# Patient Record
Sex: Female | Born: 1975 | Race: White | Hispanic: No | Marital: Married | Smoking: Former smoker
Health system: Southern US, Community
[De-identification: ages and names within clinical notes are randomized; demographics above are authoritative.]

## PROBLEM LIST (undated history)

## (undated) DIAGNOSIS — K08109 Complete loss of teeth, unspecified cause, unspecified class: Secondary | ICD-10-CM

## (undated) DIAGNOSIS — D494 Neoplasm of unspecified behavior of bladder: Secondary | ICD-10-CM

## (undated) DIAGNOSIS — F319 Bipolar disorder, unspecified: Secondary | ICD-10-CM

## (undated) DIAGNOSIS — F32A Depression, unspecified: Secondary | ICD-10-CM

## (undated) DIAGNOSIS — K5900 Constipation, unspecified: Secondary | ICD-10-CM

## (undated) DIAGNOSIS — I10 Essential (primary) hypertension: Secondary | ICD-10-CM

## (undated) DIAGNOSIS — E785 Hyperlipidemia, unspecified: Secondary | ICD-10-CM

## (undated) DIAGNOSIS — K2270 Barrett's esophagus without dysplasia: Secondary | ICD-10-CM

## (undated) DIAGNOSIS — Z972 Presence of dental prosthetic device (complete) (partial): Secondary | ICD-10-CM

## (undated) DIAGNOSIS — R42 Dizziness and giddiness: Secondary | ICD-10-CM

## (undated) DIAGNOSIS — R3915 Urgency of urination: Secondary | ICD-10-CM

## (undated) DIAGNOSIS — F419 Anxiety disorder, unspecified: Secondary | ICD-10-CM

## (undated) DIAGNOSIS — I2089 Other forms of angina pectoris: Secondary | ICD-10-CM

## (undated) DIAGNOSIS — J309 Allergic rhinitis, unspecified: Secondary | ICD-10-CM

## (undated) DIAGNOSIS — D649 Anemia, unspecified: Secondary | ICD-10-CM

## (undated) DIAGNOSIS — F909 Attention-deficit hyperactivity disorder, unspecified type: Secondary | ICD-10-CM

## (undated) DIAGNOSIS — E119 Type 2 diabetes mellitus without complications: Secondary | ICD-10-CM

## (undated) DIAGNOSIS — Z973 Presence of spectacles and contact lenses: Secondary | ICD-10-CM

## (undated) DIAGNOSIS — I2510 Atherosclerotic heart disease of native coronary artery without angina pectoris: Secondary | ICD-10-CM

## (undated) DIAGNOSIS — Z87442 Personal history of urinary calculi: Secondary | ICD-10-CM

## (undated) DIAGNOSIS — C801 Malignant (primary) neoplasm, unspecified: Secondary | ICD-10-CM

## (undated) DIAGNOSIS — I208 Other forms of angina pectoris: Secondary | ICD-10-CM

## (undated) DIAGNOSIS — R310 Gross hematuria: Secondary | ICD-10-CM

## (undated) DIAGNOSIS — M1990 Unspecified osteoarthritis, unspecified site: Secondary | ICD-10-CM

## (undated) DIAGNOSIS — K219 Gastro-esophageal reflux disease without esophagitis: Secondary | ICD-10-CM

## (undated) HISTORY — DX: Other forms of angina pectoris: I20.8

## (undated) HISTORY — PX: OVARIAN CYST REMOVAL: SHX89

## (undated) HISTORY — DX: Other forms of angina pectoris: I20.89

## (undated) HISTORY — DX: Constipation, unspecified: K59.00

## (undated) HISTORY — DX: Barrett's esophagus without dysplasia: K22.70

## (undated) HISTORY — DX: Atherosclerotic heart disease of native coronary artery without angina pectoris: I25.10

## (undated) HISTORY — PX: ABDOMINAL HYSTERECTOMY: SHX81

## (undated) HISTORY — PX: FOOT SURGERY: SHX648

---

## 2004-05-07 ENCOUNTER — Inpatient Hospital Stay (HOSPITAL_COMMUNITY): Admission: EM | Admit: 2004-05-07 | Discharge: 2004-05-09 | Payer: Self-pay | Admitting: Emergency Medicine

## 2004-05-16 ENCOUNTER — Inpatient Hospital Stay (HOSPITAL_COMMUNITY): Admission: RE | Admit: 2004-05-16 | Discharge: 2004-05-19 | Payer: Self-pay | Admitting: Obstetrics & Gynecology

## 2004-05-16 HISTORY — PX: TOTAL ABDOMINAL HYSTERECTOMY W/ BILATERAL SALPINGOOPHORECTOMY: SHX83

## 2004-05-31 ENCOUNTER — Emergency Department (HOSPITAL_COMMUNITY): Admission: EM | Admit: 2004-05-31 | Discharge: 2004-05-31 | Payer: Self-pay | Admitting: Emergency Medicine

## 2004-06-12 ENCOUNTER — Encounter (HOSPITAL_COMMUNITY): Admission: RE | Admit: 2004-06-12 | Discharge: 2004-07-12 | Payer: Self-pay | Admitting: Obstetrics & Gynecology

## 2004-12-30 ENCOUNTER — Ambulatory Visit (HOSPITAL_COMMUNITY): Admission: RE | Admit: 2004-12-30 | Discharge: 2004-12-31 | Payer: Self-pay | Admitting: Otolaryngology

## 2004-12-30 ENCOUNTER — Encounter (INDEPENDENT_AMBULATORY_CARE_PROVIDER_SITE_OTHER): Payer: Self-pay | Admitting: *Deleted

## 2004-12-30 HISTORY — PX: TONSILLECTOMY: SUR1361

## 2005-03-08 ENCOUNTER — Emergency Department (HOSPITAL_COMMUNITY): Admission: EM | Admit: 2005-03-08 | Discharge: 2005-03-08 | Payer: Self-pay | Admitting: Emergency Medicine

## 2005-06-18 ENCOUNTER — Emergency Department (HOSPITAL_COMMUNITY): Admission: EM | Admit: 2005-06-18 | Discharge: 2005-06-18 | Payer: Self-pay | Admitting: Emergency Medicine

## 2005-07-06 ENCOUNTER — Encounter (HOSPITAL_COMMUNITY): Admission: RE | Admit: 2005-07-06 | Discharge: 2005-08-05 | Payer: Self-pay | Admitting: Internal Medicine

## 2005-10-08 ENCOUNTER — Encounter (HOSPITAL_COMMUNITY): Admission: RE | Admit: 2005-10-08 | Discharge: 2005-10-10 | Payer: Self-pay | Admitting: Orthopaedic Surgery

## 2005-10-12 ENCOUNTER — Encounter (HOSPITAL_COMMUNITY): Admission: RE | Admit: 2005-10-12 | Discharge: 2005-11-11 | Payer: Self-pay | Admitting: Orthopaedic Surgery

## 2005-11-12 ENCOUNTER — Encounter (HOSPITAL_COMMUNITY): Admission: RE | Admit: 2005-11-12 | Discharge: 2005-12-12 | Payer: Self-pay | Admitting: Orthopaedic Surgery

## 2006-05-07 ENCOUNTER — Emergency Department (HOSPITAL_COMMUNITY): Admission: EM | Admit: 2006-05-07 | Discharge: 2006-05-07 | Payer: Self-pay | Admitting: Emergency Medicine

## 2007-06-08 ENCOUNTER — Encounter (HOSPITAL_COMMUNITY): Admission: RE | Admit: 2007-06-08 | Discharge: 2007-07-08 | Payer: Self-pay | Admitting: Orthopaedic Surgery

## 2007-07-11 ENCOUNTER — Encounter (HOSPITAL_COMMUNITY): Admission: RE | Admit: 2007-07-11 | Discharge: 2007-08-10 | Payer: Self-pay | Admitting: Orthopaedic Surgery

## 2007-08-11 ENCOUNTER — Encounter (HOSPITAL_COMMUNITY): Admission: RE | Admit: 2007-08-11 | Discharge: 2007-09-10 | Payer: Self-pay | Admitting: Orthopaedic Surgery

## 2008-09-17 ENCOUNTER — Emergency Department (HOSPITAL_COMMUNITY): Admission: EM | Admit: 2008-09-17 | Discharge: 2008-09-17 | Payer: Self-pay | Admitting: Emergency Medicine

## 2009-08-19 ENCOUNTER — Emergency Department (HOSPITAL_COMMUNITY): Admission: EM | Admit: 2009-08-19 | Discharge: 2009-08-19 | Payer: Self-pay | Admitting: Emergency Medicine

## 2009-10-02 ENCOUNTER — Ambulatory Visit: Payer: Self-pay | Admitting: Orthopedic Surgery

## 2009-10-02 ENCOUNTER — Encounter (INDEPENDENT_AMBULATORY_CARE_PROVIDER_SITE_OTHER): Payer: Self-pay | Admitting: *Deleted

## 2009-10-02 DIAGNOSIS — G5600 Carpal tunnel syndrome, unspecified upper limb: Secondary | ICD-10-CM

## 2009-10-06 ENCOUNTER — Emergency Department (HOSPITAL_COMMUNITY): Admission: EM | Admit: 2009-10-06 | Discharge: 2009-10-06 | Payer: Self-pay | Admitting: Emergency Medicine

## 2009-12-19 ENCOUNTER — Emergency Department (HOSPITAL_COMMUNITY): Admission: EM | Admit: 2009-12-19 | Discharge: 2009-09-23 | Payer: Self-pay | Admitting: Emergency Medicine

## 2010-02-11 NOTE — Letter (Signed)
Summary: Out of Work  [facility]  [address]  [place], [place] [address]   Phone: [phone]  Fax: [phone]    October 02, 2009   Employee:  TIFFANY CALMES    To Whom It May Concern:   For Medical reasons, please excuse the above named employee from work for the following dates:  Start:   10/02/09  End/Return to regular work schedule, following appointment in our office today: 10/02/09  If you need additional information, please feel free to contact our office.         Sincerely,    Terrance Mass, MD

## 2010-02-11 NOTE — Letter (Signed)
Summary: History form  History form   Imported By: Jacklynn Ganong 10/07/2009 08:57:33  _____________________________________________________________________  External Attachment:    Type:   Image     Comment:   External Document

## 2010-02-11 NOTE — Assessment & Plan Note (Signed)
Summary: EVAL/TREAT GANGLION CYSTS,LT HAND/WRIST/XRAYS [facility],BR...   Vital Signs:  Patient profile:   35 year old female Height:      61.5 inches Weight:      167 pounds Pulse rate:   72 / minute Resp:     18 per minute  Vitals Entered By: Fuller Canada MD (October 02, 2009 2:27 PM)  Visit Type:  new patient Referring Provider:  Dr. Jorene Guest Primary Provider:  Dr. Jorene Guest  CC:  ganglion left hand.  History of Present Illness: I saw Sandra [name] in the office today for an initial visit.  She is a 35 years old woman with the complaint of:  ganglion cysts left hand/wrist.  Xrays 09/12/09 for review on disc, report for review also.  Meds: none.  The patient complains of numbness tingling and inability to hold things status post carpal tunnel release 4 years ago.  She has a ganglion cyst in the dorsum of her wrist which apparently her referring physicians thinks is causing her pain because she had a nerve conduction study which was negative.  However, she indicates that when she puts her carpal tunnel brace on her symptoms go away.  DR Rayburn Ma did carpal tunnel relase 4 years ago.   I was able to obtain the nerve conduction study report from [place] and it indicates that the patient has recurrent carpal tunnel syndrome.      Allergies (verified): 1)  ! Penicillin 2)  ! Flagyl 3)  ! Claritin 4)  ! 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  Review of Systems Constitutional:  Denies weight loss, weight gain, fever, chills, and fatigue. Cardiovascular:  Denies chest pain, palpitations, fainting, and  murmurs. Respiratory:  Complains of couch; denies short of breath, wheezing, tightness, pain on inspiration, and snoring . Gastrointestinal:  Complains of heartburn; denies nausea, vomiting, diarrhea, constipation, and blood in your stools. Genitourinary:  Denies frequency, urgency, difficulty urinating, painful urination, flank pain, and bleeding in urine. Neurologic:  Complains of numbness and tingling; denies unsteady gait, dizziness, tremors, and seizure. Musculoskeletal:  Complains of joint pain and muscle pain; denies swelling, instability, stiffness, redness, and heat. Endocrine:  Denies excessive thirst, exessive urination, and heat or cold intolerance. Psychiatric:  Complains of depression and anxiety; denies nervousness and hallucinations. Skin:  Denies changes in the skin, poor healing, rash, itching, and redness. HEENT:  Denies blurred or double vision, eye pain, redness, and watering. Immunology:  Complains of seasonal allergies; denies sinus problems and allergic to bee stings. Hemoatologic:  Denies easy bleeding and brusing.  Physical Exam  Additional Exam:  GEN: well developed, well nourished, normal grooming and hygiene, no deformity and normal body habitus.   CDV: pulses are normal, no edema, no erythema. no tenderness  Lymph: normal lymph nodes   Skin: no rashes, skin lesions or open sores   NEURO: normal coordination, reflexes, sensation.   Psyche: awake, alert and oriented. Mood normal   There is a carpal tunnel incision on the volar aspect of the LEFT hand and a small ganglion cyst on dorsal surface.  It is nontender.  She has full range of motion of the hand.  Her grip strength  seems normal.  Her wrist joint is stable.  She has normal range of motion in the hand.  Her carpal tunnel is nontender.    Impression & Recommendations:  Problem # 1:  CARPAL TUNNEL SYNDROME (ICD-354.0) Assessment New imaging: X-rays were obtained through a disc hand and wrist 3  views each 60s total negative.   Based on her history and her relief of symptoms with bracing and her history of similar symptoms prior which she confirms is just like her carpal tunnel symptoms were before I think she has carpal tunnel syndrome.  It is unlikely that the ganglion cyst which cause numbness tingling and loss of strength in the hand  Recommend bracing at night, vitamin B 600 mg twice a day returns 6 weeks reassess.  Orders: New Patient Level III (40981)  Patient Instructions: 1)  brace at night and VIT B6 for 6 weeks

## 2010-02-11 NOTE — Letter (Signed)
Summary: Previous notes from another doctor  Previous notes from another doctor   Imported By: Jacklynn Ganong 10/07/2009 08:58:33  _____________________________________________________________________  External Attachment:    Type:   Image     Comment:   External Document

## 2010-04-18 LAB — URINALYSIS, ROUTINE W REFLEX MICROSCOPIC
Glucose, UA: NEGATIVE mg/dL
Hgb urine dipstick: NEGATIVE
Ketones, ur: NEGATIVE mg/dL
Nitrite: NEGATIVE
Specific Gravity, Urine: >1.03 — ABNORMAL HIGH (ref 1.005–1.030)

## 2010-05-30 NOTE — Op Note (Signed)
[name], [name]               ACCOUNT NO.:  [id number]   MEDICAL RECORD NO.:  [id number]          PATIENT TYPE:  OIB   LOCATION:  5709                         FACILITY:  [facility]   PHYSICIAN:  Antony Contras, MD     DATE OF BIRTH:  06-27-1975   DATE OF PROCEDURE:  12/30/2004  DATE OF DISCHARGE:  12/31/2004                                 OPERATIVE REPORT   PREOPERATIVE DIAGNOSIS:  Chronic tonsillitis.   POSTOPERATIVE DIAGNOSIS:  Chronic tonsillitis.   PROCEDURE:  Tonsillectomy.   SURGEON:  Antony Contras, M.D.   ANESTHESIA:  General endotracheal anesthesia.   COMPLICATIONS:  None.   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  This has been a problem for about 20 years and requires  multiple rounds of antibiotics and is associated with missed work and other  activities.  She presents for surgical removal of her tonsils.   FINDINGS:  Tonsils were about 3+ in size bilaterally.   DESCRIPTION OF PROCEDURE:  Patient was identified in the holding room and  informed consent having been obtained, the patient is moved to the operative  suite and put on the operating table in supine position.  Anesthesia was  induced.  The patient was intubated by the anesthesia team without  difficulty.  The patient was given intravenous antibiotics and steroids  during the case.  The bed was turned 90 degrees from anesthesia  and a head  wrap was placed around the patient's head.  A Crowe-Davis retractor was then  inserted into the mouth and opened to reveal the oropharynx.  This was  placed in suspension on a Mayo stand.  The right tonsil was grasped with a  curved Allis and retracted medially.  Bovie electrocautery was then used to  make a curvilinear incision along the anterior tonsillar pillar and then  used to dissect the subcapsular plane until the tonsil was  removed.  Tonsil  was passed to nursing for pathology and a tonsil pack was placed in the  right tonsil fossa due to bleeding.  Same procedure was then carried out on  the left side including Bovie electrocautery dissection of the tonsil and  placement of a pack.  Once again, the tonsil was passed to nursing for  pathology.  The right tonsil pack was then removed and bleeding controlled  with suction and Bovie electrocautery.  The same was then performed on the  left side.  The oropharynx was then copiously irrigated with saline.  The  uvula was then pulled anteriorly and laryngeal mirror was inserted to view  the nasopharynx.  The adenoids found to be nonexistent.  At this point, the  nose and mouth were copiously  irrigated again with saline.  A nasogastric tube was passed down the  esophagus to suck out the stomach and esophagus.  The retractor was then  taken out of suspension and removed from the patient's mouth.  She was  turned back to anesthesia for wake-up and  was extubated and moved to the  recovery room in stable condition.      Antony Contras, MD  Electronically Signed     DDB/MEDQ  D:  12/30/2004  T:  01/01/2005  Job:  [id number]

## 2010-05-30 NOTE — Consult Note (Signed)
Sandra [name], TROUTMAN               ACCOUNT NO.:  [id number]   MEDICAL RECORD NO.:  [id number]          PATIENT TYPE:  EMS   LOCATION:  ED                            FACILITY:  [facility]   PHYSICIAN:  Tilda Burrow, M.D. DATE OF BIRTH:  09-09-1975   DATE OF CONSULTATION:  05/31/2004  DATE OF DISCHARGE:                                   CONSULTATION   CHIEF COMPLAINT:  Wound leaking blood.   This 35 year old female is now approximately two weeks status post  hysterectomy with identified subcutaneous wound hematoma seen in the office  yesterday. Small opening has continued to gush intermittently through the  night. The patient presents to the emergency room for reassessment.  Examination shows well healed surface with a small opening that upon  palpation produces dark old blood without evidence of purulence. There is no  local erythema.   Prepping and local anesthesia infiltration with Xylocaine with epinephrine  2% was performed, and then wound opened to a width of 3 to 4 cm and probed  to the depth of the fascia. It is then irrigated with saline solution until  the majority of the clots and blood are removed. Then the tip of two 4x4s  packed in each corner to allow wicking to occur. Loose dressing applied, and  patient given instructions for care. Will follow up 48 hours, 9 a.m. Monday,  [facility], appointment with Dr. Despina Hidden for removal of 4x4s and  consideration of closing the wound if it looks nice and clean. May require  more than one packing before delayed secondary closure.   DISCHARGE MEDICATIONS:  1.  Vicodin 5/500 x20 tablets, refill x1, 1 to 2 q.4h p.r.n. pain.  2.  Estratest 1 tablet p.o. q.d., refill x1 year.  3.  Keflex 500 q.i.d. x5 days, dispense 20.      JVF/MEDQ  D:  05/31/2004  T:  05/31/2004  Job:  [id number]   cc:   [facility]

## 2010-05-30 NOTE — Discharge Summary (Signed)
Sandra [name], Sandra [name]               ACCOUNT NO.:  [id number]   MEDICAL RECORD NO.:  [id number]          PATIENT TYPE:  INP   LOCATION:  A428                          FACILITY:  [facility]   PHYSICIAN:  Lazaro Arms, M.D.   DATE OF BIRTH:  26-Oct-1975   DATE OF ADMISSION:  05/16/2004  DATE OF DISCHARGE:  05/08/2006LH                                 DISCHARGE SUMMARY   DISCHARGE DIAGNOSES:  1.  Status post total abdominal hysterectomy/bilateral salpingo-      oophorectomy.  2.  Unremarkable postoperative course.   PROCEDURE:  Total abdominal hysterectomy/bilateral salpingo-oophorectomy.   Please refer to the transcribed history and physical and the operative note  for details of admission to the hospital.   HOSPITAL COURSE:  Patient was admitted postoperatively.  Surgery went well.  She had sort of excessive pain postoperatively but we were able to control  it with Toradol and a Dilaudid PCA.  Her postoperative day #1 hemoglobin was  12.3, hematocrit 35.6.  Postoperative day #3 it was 11.4 and 33.2 and the  white counts were normal.  She tolerated a progression diet from clear  liquids to regular diet.  She voided without symptoms.  She was ambulatory.  Her incision was clean, dry, and intact and she remained afebrile.  She was  discharged to home on postoperative day #3 in good, stable condition to take  Tylox and Toradol.  She was given Prevacid and Ambien as well for sleep.  She will be seen back in the office next week to have her staples removed  and Steri-Strips placed.  She was given instructions and precautions for  return to the office prior to that time.      LHE/MEDQ  D:  06/04/2004  T:  06/04/2004  Job:  [id number]

## 2010-05-30 NOTE — H&P (Signed)
Sandra [name], Sandra [name]               ACCOUNT NO.:  [id number]   MEDICAL RECORD NO.:  [id number]          PATIENT TYPE:  INP   LOCATION:  A428                          FACILITY:  [facility]   PHYSICIAN:  Lazaro Arms, M.D.   DATE OF BIRTH:  28-Sep-1975   DATE OF ADMISSION:  05/07/2004  DATE OF DISCHARGE:  LH                                HISTORY & PHYSICAL   HISTORY:  Willie is 35 year old gravida 0, para 0 who is seen in the ER  complaining of pelvic pain and right lower quadrant pain.  The patient had  been seen in [place] the night previously and was actually supposed to see  [place] for the first time this Friday.  At the time of evaluation in [place]  she was found to have right hydrosalpinx and some free fluid in the pelvis.  She had been afebrile.  On exam in the ER she has significant tenderness of  moderate-to-severe.  No rebound.  This is not an acute abdomen.  She has no  CVA tenderness.  My impression is that this represents a possible tube  ovarian abscess of a flair of a previous problem.  She had surgery back in  2003 up in [place].  She had a cyst removed from her right ovary and  fluid drained from her right tube, which makes me think she probably had  some inflammatory process, especially since she does not use birth control  and has never been pregnant.  This probably represents a chronic  inflammatory/infectious process of the pelvis.   PAST MEDICAL HISTORY:  Otherwise negative.   PAST SURGICAL HISTORY:  Just the surgery noted above.   PAST OBSTETRICAL HISTORY:  She is nulliparous.   MEDICATIONS:  Motrin as needed.   ALLERGIES:  1. PENICILLIN.  2. FLAGYL.     REVIEW OF SYSTEMS:  As per HPI.  Again, she has been afebrile according to  her history.   PHYSICAL EXAMINATION:  HEENT:  Unremarkable.  NECK:  Thyroid is normal.  CHEST:  Clear.  HEART:  Regular rate and rhythm without murmurs, rubs, or gallops.  BREASTS:  Deferred.  ABDOMEN:  Again as above she has  tenderness in both lower quadrants.  I  would say right greater than left.  No rebound.  No CVA tenderness.  PELVIC:  Reveals fullness in the right adnexa and +- cervical motion  tenderness.  EXTREMITIES:  Warm.  No edema.  NEUROLOGIC:  Grossly intact.   IMPRESSION:  1. Pelvic abdominal pain.  2. History of chronic inflammatory/infectious pelvic process.  3. Tubal ovarian process with a right hydrosalpinx and cystic area around      the right ovary.     PLAN:  We will do a CT scan of the abdomen and pelvis today to evaluate for  tubal ovarian abscess versus pelvic abscess.  If she does not respond to  antibiotics appropriately, then we will consider aggressive surgical  management.      LHE/MEDQ  D:  05/08/2004  T:  05/08/2004  Job:  [id number]

## 2010-05-30 NOTE — Op Note (Signed)
NAMEALDEN, Sandra [name]               ACCOUNT NO.:  [id number]   MEDICAL RECORD NO.:  [id number]          PATIENT TYPE:  INP   LOCATION:  A428                          FACILITY:  [facility]   PHYSICIAN:  Lazaro Arms, M.D.   DATE OF BIRTH:  07/21/1975   DATE OF PROCEDURE:  05/16/2004  DATE OF DISCHARGE:  05/19/2004                                 OPERATIVE REPORT   PREOPERATIVE DIAGNOSES:  1.  Chronic pelvic pain.  2.  Dysmenorrhea.  3.  Right hydrosalpinx and previous surgery for right ovarian cyst.   POSTOPERATIVE DIAGNOSES:  1.  Chronic pelvic pain.  2.  Dysmenorrhea.  3.  Right hydrosalpinx and previous surgery for right ovarian cyst.   PROCEDURES:  1.  Abdominal hysterectomy.  2.  Bilateral salpingo-oophorectomy.   SURGEON:  Lazaro Arms, M.D.   ANESTHESIA:  General endotracheal.   FINDINGS:  The patient had adhesions of the right ovary and tube, the right  pelvic sidewall and small bowel.  She did not really have a hydrosalpinx as  was demonstrated on the ultrasound.  She had some filmy adhesions to the  adnexa bilaterally.  Otherwise the uterus appeared to be normal.   DESCRIPTION OF PROCEDURE:  The patient was taken to the operating room and  placed in the supine position where she underwent general endotracheal  anesthesia.  She was then prepped and draped in the usual sterile fashion.  A Pfannenstiel skin incision was made and carried down sharply to the rectus  fashion, which was scored in the midline and extended laterally.  The fascia  was taken off of the muscles superiorly and inferiorly without difficulty.  The muscles were divided and the perineal cavity was entered.  A self-  retaining retractor was placed.  The upper abdomen was packed away.  The  uterine cornu were grasped.  The left round ligament was suture ligated and  cut.  The right round ligament was suture ligated and cut.  The infundibular  pelvic ligament was isolated bilaterally.  It was clamped,  cut and double  suture ligated bilaterally.  The vesicouterine serosal flap was created and  pushed down off of the lower uterine segment.  The uterine vessels were  skeletonized.  Each uterine vessel pedicle was clamped, cut and suture  ligated.  Serial pedicles were taken down the cervix to the cardial  ligament, each pedicle being clamped, cut and suture ligated.  The vagina  was encountered.  It was cross clamped.  The specimen was removed.  Vaginal  angle sutures were placed.  The vagina was closed with interrupted figure-of-  eight sutures.  The pelvis was irrigated vigorously.  There was good  hemostasis of all pedicles.  Interceed was placed over the vaginal cuff to  prevent adhesions in the future.  Again, all pedicles were hemostatic.  The  protractor was removed, as well as the packs in the upper abdomen.  The  muscles were reapproximated loosely.  The fascia was closed using 0 Vicryl  running.  The subcutaneous tissue was made hemostatic and irrigated.  The  skin was  closed using skin staples.  The patient tolerated the procedure  well.  She experienced 250 mL of blood loss and was taken to recovery in  good stable condition.  All counts were correct x 3.      LHE/MEDQ  D:  06/04/2004  T:  06/04/2004  Job:  [id number]

## 2010-05-30 NOTE — Discharge Summary (Signed)
[name], Sandra               ACCOUNT NO.:  [id number]   MEDICAL RECORD NO.:  [id number]          PATIENT TYPE:  INP   LOCATION:  A428                          FACILITY:  [facility]   PHYSICIAN:  Lazaro Arms, M.D.   DATE OF BIRTH:  12-21-1975   DATE OF ADMISSION:  05/07/2004  DATE OF DISCHARGE:  04/28/2006LH                                 DISCHARGE SUMMARY   DISCHARGE DIAGNOSES:  1.  Chronic pelvic pain.  2.  Dysmenorrhea.  3.  Right hydrosalpinx.   Please refer to the transcribed history and physical for details of  admission to the hospital.   HOSPITAL COURSE:  The patient was admitted and was treated empirically for  pelvic inflammatory disease using Levaquin and Zithromax.  Her cultures  subsequently came back negative.  Ultrasound from an ER visit in [place],  [place], just previous to admission here, revealed that she had a  hydrosalpinx on the right side.  She has had previous pelvic surgery in the  past and has chronic pelvic pain and chronic inflammatory process.  After a  couple of days of IV antibiotics, the patient was really no better.  I felt  like this was a chronic condition.  Her white count was normal.  As a  result, we made plans to discharge her home on May 09, 2004, and to have  her come back to the hospital next week to undergo an abdominal  hysterectomy.  The patient is nulliparous and previously was debating  whether to do definitive surgery, but she has now got to the point where her  pain is such that she needs to do definitive surgery.  We will see her back  next week.  She as discharged to home on oral pain medicine and again to  follow up in the office the first part of next week.      LHE/MEDQ  D:  06/04/2004  T:  06/04/2004  Job:  [id number]

## 2011-03-01 ENCOUNTER — Emergency Department (HOSPITAL_COMMUNITY): Payer: Medicaid Other

## 2011-03-01 ENCOUNTER — Emergency Department (HOSPITAL_COMMUNITY)
Admission: EM | Admit: 2011-03-01 | Discharge: 2011-03-01 | Disposition: A | Discharge status: Home / Self Care | Payer: Medicaid Other | Attending: Emergency Medicine | Admitting: Emergency Medicine

## 2011-03-01 ENCOUNTER — Other Ambulatory Visit: Payer: Self-pay

## 2011-03-01 ENCOUNTER — Encounter (HOSPITAL_COMMUNITY): Payer: Self-pay

## 2011-03-01 DIAGNOSIS — R1033 Periumbilical pain: Secondary | ICD-10-CM | POA: Diagnosis not present

## 2011-03-01 DIAGNOSIS — R1013 Epigastric pain: Secondary | ICD-10-CM | POA: Diagnosis not present

## 2011-03-01 DIAGNOSIS — R109 Unspecified abdominal pain: Secondary | ICD-10-CM | POA: Diagnosis present

## 2011-03-01 DIAGNOSIS — R10811 Right upper quadrant abdominal tenderness: Secondary | ICD-10-CM | POA: Diagnosis not present

## 2011-03-01 DIAGNOSIS — R51 Headache: Secondary | ICD-10-CM | POA: Insufficient documentation

## 2011-03-01 DIAGNOSIS — F17200 Nicotine dependence, unspecified, uncomplicated: Secondary | ICD-10-CM | POA: Insufficient documentation

## 2011-03-01 DIAGNOSIS — D72829 Elevated white blood cell count, unspecified: Secondary | ICD-10-CM | POA: Insufficient documentation

## 2011-03-01 DIAGNOSIS — M549 Dorsalgia, unspecified: Secondary | ICD-10-CM | POA: Diagnosis not present

## 2011-03-01 DIAGNOSIS — Z9079 Acquired absence of other genital organ(s): Secondary | ICD-10-CM | POA: Insufficient documentation

## 2011-03-01 LAB — URINALYSIS, ROUTINE W REFLEX MICROSCOPIC
Bilirubin Urine: NEGATIVE
Ketones, ur: NEGATIVE mg/dL
Protein, ur: NEGATIVE mg/dL
Specific Gravity, Urine: >1.03 — ABNORMAL HIGH (ref 1.005–1.030)
Urobilinogen, UA: 0.2 mg/dL (ref 0.0–1.0)
pH: 5 (ref 5.0–8.0)

## 2011-03-01 LAB — COMPREHENSIVE METABOLIC PANEL
ALT: 17 U/L (ref 0–35)
Albumin: 3.9 g/dL (ref 3.5–5.2)
CO2: 25 mEq/L (ref 19–32)
Creatinine, Ser: 0.81 mg/dL (ref 0.50–1.10)
GFR calc Af Amer: >90 mL/min (ref >90)
Sodium: 136 mEq/L (ref 135–145)
Total Bilirubin: 0.3 mg/dL (ref 0.3–1.2)
Total Protein: 8 g/dL (ref 6.0–8.3)

## 2011-03-01 LAB — CBC
Hemoglobin: 14.7 g/dL (ref 12.0–15.0)
MCH: 26.9 pg (ref 26.0–34.0)
MCHC: 31.5 g/dL (ref 30.0–36.0)
MCV: 85.4 fL (ref 78.0–100.0)
Platelets: 319 10*3/uL (ref 150–400)

## 2011-03-01 LAB — URINE MICROSCOPIC-ADD ON

## 2011-03-01 MED ORDER — HYDROMORPHONE HCL PF 1 MG/ML IJ SOLN
1.0000 mg | Freq: Once | INTRAMUSCULAR | Status: AC
  Administered 2011-03-01: 1 mg via INTRAVENOUS
  Filled 2011-03-01: qty 1

## 2011-03-01 MED ORDER — ONDANSETRON HCL 4 MG/2ML IJ SOLN
4.0000 mg | Freq: Once | INTRAMUSCULAR | Status: AC
  Administered 2011-03-01: 4 mg via INTRAVENOUS
  Filled 2011-03-01: qty 2

## 2011-03-01 MED ORDER — PROMETHAZINE HCL 25 MG PO TABS: 25.0000 mg | ORAL_TABLET | Freq: Four times a day (QID) | ORAL | Status: DC | PRN

## 2011-03-01 MED ORDER — SODIUM CHLORIDE 0.9 % IV BOLUS (SEPSIS)
250.0000 mL | Freq: Once | INTRAVENOUS | Status: AC
  Administered 2011-03-01: 250 mL via INTRAVENOUS

## 2011-03-01 MED ORDER — SODIUM CHLORIDE 0.9 % IV SOLN: INTRAVENOUS | Status: DC

## 2011-03-01 MED ORDER — IOHEXOL 300 MG/ML  SOLN
100.0000 mL | Freq: Once | INTRAMUSCULAR | Status: AC | PRN
  Administered 2011-03-01: 100 mL via INTRAVENOUS

## 2011-03-01 MED ORDER — HYDROMORPHONE HCL 2 MG PO TABS: 2.0000 mg | ORAL_TABLET | ORAL | Status: AC | PRN

## 2011-03-01 NOTE — ED Notes (Signed)
Pt presents with epigastric pain since yesterday. Pt denies all other symptoms.

## 2011-03-01 NOTE — ED Provider Notes (Signed)
History    Scribed for Sandra Jakes, MD, the patient was seen in room APA09/APA09. This chart was scribed by Katha Cabal.   CSN: [id number]  Arrival date & time 03/01/11  1740   First MD Initiated Contact with Patient 03/01/11 1820      Chief Complaint  Patient presents with  . Abdominal Pain    (Consider location/radiation/quality/duration/timing/severity/associated sxs/prior treatment) HPI Sandra [name] is a 36 y.o. female who presents to the Emergency Department complaining of intermittent upper abdominal pain since yesterday around 5 AM.  Pain does not radiate to back.  Pain worsened by nothing.  Not associated with movement or eating.  Patient reports similar pain prior to having hysterectomy.   Symptoms are associated with headaches. Patient has ongoing back pain.  Symptoms are not associated with nausea, vomiting, diarrhea.  Denies cough, congestion, sore throat, neck pain, chest pain, painful urination, vaginal bleeding, leg swelling and rash.   No LMP recorded. Patient has had a hysterectomy. PCP No primary provider on file.    History reviewed. No pertinent past medical history.  Past Surgical History  Procedure Date  . Abdominal hysterectomy     No family history on file.  History  Substance Use Topics  . Smoking status: Current Everyday Smoker -- 0.5 packs/day  . Smokeless tobacco: Not on file  . Alcohol Use: No    OB History    Grav Para Term Preterm Abortions TAB SAB Ect Mult Living                  Review of Systems  All other systems reviewed and are negative.    Allergies  Codeine; Loratadine; Metronidazole; and Penicillins  Home Medications   Current Outpatient Rx  Name Route Sig Dispense Refill  . ALPRAZOLAM 2 MG PO TABS Oral Take 2 mg by mouth 3 (three) times daily.    . TRAMADOL HCL 50 MG PO TABS Oral Take 50 mg by mouth every 6 (six) hours as needed. pain      BP 114/82  Pulse 95  Temp(Src) 97.5 F (36.4 C) (Oral)   Resp 20  Ht 5' (1.524 m)  Wt 145 lb (65.772 kg)  BMI 28.32 kg/m2  SpO2 99%  Physical Exam  Nursing note and vitals reviewed. Constitutional: She is oriented to person, place, and time. She appears well-developed and well-nourished.  HENT:  Head: Normocephalic and atraumatic.  Eyes: Conjunctivae and EOM are normal.  Neck: Neck supple.  Cardiovascular: Normal rate, regular rhythm and normal heart sounds.   No murmur heard. Pulmonary/Chest: Effort normal and breath sounds normal. No respiratory distress.  Abdominal: Soft. Bowel sounds are decreased. There is tenderness in the right upper quadrant. There is no rebound and no guarding.  Musculoskeletal: Normal range of motion. She exhibits no edema.  Neurological: She is alert and oriented to person, place, and time. No sensory deficit.  Skin: Skin is warm and dry. No rash noted.  Psychiatric: She has a normal mood and affect. Her speech is normal and behavior is normal.    ED Course  Procedures (including critical care time)   DIAGNOSTIC STUDIES: Oxygen Saturation is 99% on room air, normal by my interpretation.    EKG:   Date: 03/01/2011  Rate: 61  Rhythm: normal sinus rhythm  QRS Axis: normal  Intervals: normal  ST/T Wave abnormalities: normal  Conduction Disutrbances:none  Narrative Interpretation:   Old EKG Reviewed: none available    COORDINATION OF CARE: 7:00 PM  Physical exam complete.  IV fluids.  Will CT abdomen. CXR.  7:15 PM Zofran and Dilaudid ordered  8:04 PM  Omnipaque ordered 9:00 PM  Plan to discharge patient.  Patient agrees with plan.      LABS / RADIOLOGY:   Labs Reviewed  CBC - Abnormal; Notable for the following:    WBC 11.3 (*)    RBC 5.47 (*)    HCT 46.7 (*)    All other components within normal limits  COMPREHENSIVE METABOLIC PANEL - Abnormal; Notable for the following:    Glucose, Bld 104 (*)    Alkaline Phosphatase 119 (*)    All other components within normal limits  URINALYSIS,  ROUTINE W REFLEX MICROSCOPIC - Abnormal; Notable for the following:    APPearance HAZY (*)    Specific Gravity, Urine >1.030 (*)    Hgb urine dipstick SMALL (*)    All other components within normal limits  URINE MICROSCOPIC-ADD ON - Abnormal; Notable for the following:    Squamous Epithelial / LPF FEW (*)    Bacteria, UA MANY (*)    Crystals CA OXALATE CRYSTALS (*)    All other components within normal limits  LIPASE, BLOOD    Results for orders placed during the hospital encounter of 03/01/11  CBC      Component Value Range   WBC 11.3 (*) 4.0 - 10.5 (K/uL)   RBC 5.47 (*) 3.87 - 5.11 (MIL/uL)   Hemoglobin 14.7  12.0 - 15.0 (g/dL)   HCT 69.6 (*) 29.5 - 46.0 (%)   MCV 85.4  78.0 - 100.0 (fL)   MCH 26.9  26.0 - 34.0 (pg)   MCHC 31.5  30.0 - 36.0 (g/dL)   RDW 28.4  13.2 - 44.0 (%)   Platelets 319  150 - 400 (K/uL)  COMPREHENSIVE METABOLIC PANEL      Component Value Range   Sodium 136  135 - 145 (mEq/L)   Potassium 4.1  3.5 - 5.1 (mEq/L)   Chloride 102  96 - 112 (mEq/L)   CO2 25  19 - 32 (mEq/L)   Glucose, Bld 104 (*) 70 - 99 (mg/dL)   BUN 13  6 - 23 (mg/dL)   Creatinine, Ser 1.02  0.50 - 1.10 (mg/dL)   Calcium 72.5  8.4 - 10.5 (mg/dL)   Total Protein 8.0  6.0 - 8.3 (g/dL)   Albumin 3.9  3.5 - 5.2 (g/dL)   AST 17  0 - 37 (U/L)   ALT 17  0 - 35 (U/L)   Alkaline Phosphatase 119 (*) 39 - 117 (U/L)   Total Bilirubin 0.3  0.3 - 1.2 (mg/dL)   GFR calc non Af Amer >90  >90 (mL/min)   GFR calc Af Amer >90  >90 (mL/min)  LIPASE, BLOOD      Component Value Range   Lipase 31  11 - 59 (U/L)  URINALYSIS, ROUTINE W REFLEX MICROSCOPIC      Component Value Range   Color, Urine YELLOW  YELLOW    APPearance HAZY (*) CLEAR    Specific Gravity, Urine >1.030 (*) 1.005 - 1.030    pH 5.0  5.0 - 8.0    Glucose, UA NEGATIVE  NEGATIVE (mg/dL)   Hgb urine dipstick SMALL (*) NEGATIVE    Bilirubin Urine NEGATIVE  NEGATIVE    Ketones, ur NEGATIVE  NEGATIVE (mg/dL)   Protein, ur NEGATIVE   NEGATIVE (mg/dL)   Urobilinogen, UA 0.2  0.0 - 1.0 (mg/dL)   Nitrite NEGATIVE  NEGATIVE    Leukocytes, UA NEGATIVE  NEGATIVE   URINE MICROSCOPIC-ADD ON      Component Value Range   Squamous Epithelial / LPF FEW (*) RARE    WBC, UA 7-10  <3 (WBC/hpf)   RBC / HPF 7-10  <3 (RBC/hpf)   Bacteria, UA MANY (*) RARE    Crystals CA OXALATE CRYSTALS (*) NEGATIVE     Dg Chest 2 View  03/01/2011  *RADIOLOGY REPORT*  Clinical Data: Epigastric abdominal pain and weakness.  CHEST - 2 VIEW  Comparison: None.  Findings: Normal sized heart.  Clear lungs.  Unremarkable bones.  IMPRESSION: Normal examination.  Original Report Authenticated By: Darrol Angel, M.D.   Ct Abdomen Pelvis W Contrast  03/01/2011  *RADIOLOGY REPORT*  Clinical Data: Abdominal pain.  Periumbilical abdominal pain. History of hysterectomy.  CT ABDOMEN AND PELVIS WITH CONTRAST  Technique:  Multidetector CT imaging of the abdomen and pelvis was performed following the standard protocol during bolus administration of intravenous contrast.  Contrast: OMNIPAQUE IOHEXOL 300 MG/ML IV SOLN  Comparison: 05/07/2006.  Findings: Clear lung bases.  There is a tiny low density lesion in the posterior right hepatic lobe bed is similar in size to the prior exam of 2008.  This is compatible with a small cyst. Gallbladder appears normal.  Pancreas normal.  Common bile duct normal.  Spleen normal.  Adrenal glands normal.  Normal renal enhancement.  The stomach is distended with oral contrast. Duodenum is within normal limits.  Mild thickening of the antrum of the stomach is likely due to peristaltic contraction.  Proximal small bowel is within normal limits.  There are marked inflammatory changes of distal small bowel, with mural thickening and mucosal hyperenhancement.  Mild abdominal aortic atherosclerosis is present.  The superior mesenteric artery appears patent as far as can be visualized.  There is no perforation.  Small amount of ascites in the  anatomic pelvis. Hysterectomy.  Urinary bladder decompressed.  Probable normal appendix identified with adjacent ascites.  Mild thickening of the cecum is present.  Transverse and descending colon appear normal. The sigmoid diverticulosis is mild. No aggressive osseous lesions. Lumbosacral transitional anatomy is present.  IMPRESSION: 1.  Marked inflammatory changes of the ileum, likely secondary to infection or inflammatory bowel disease.  Ischemia is considered unlikely.  No obstruction or perforation. 2.  Hysterectomy. 3.  Stable low density lesion in the posterior right hepatic lobe, likely cyst.  Original Report Authenticated By: Andreas Newport, M.D.         MDM  The expected patient to have gallstone disease but CT scan negative for that shows inflammation of the ileum most likely secondary to infection or inflammatory bowel disease. Patient tolerating by mouth fine at home no fever mild elevation in white blood cell count. Only mild tenderness in the right upper quadrant on exam no guarding no acute abdominal process. Discharge patient home with clear liquid diet for one to 2 days and followup with GI medicine, in addition she's been given pain medicine and antinausea medicine.        MEDICATIONS GIVEN IN THE E.D. Scheduled Meds:    . HYDROmorphone  1 mg Intravenous Once  . ondansetron  4 mg Intravenous Once  . sodium chloride  250 mL Intravenous Once   Continuous Infusions:    . sodium chloride         IMPRESSION: 1. Abdominal  pain, other specified site       I personally performed the services  described in this documentation, which was scribed in my presence. The recorded information has been reviewed and considered.       Sandra Jakes, MD 03/01/11 2111

## 2012-06-07 ENCOUNTER — Encounter (HOSPITAL_COMMUNITY): Payer: Self-pay

## 2012-06-07 ENCOUNTER — Emergency Department (HOSPITAL_COMMUNITY)
Admission: EM | Admit: 2012-06-07 | Discharge: 2012-06-07 | Disposition: A | Discharge status: Home / Self Care | Payer: Medicaid Other | Attending: Emergency Medicine | Admitting: Emergency Medicine

## 2012-06-07 ENCOUNTER — Emergency Department (HOSPITAL_COMMUNITY): Payer: Medicaid Other

## 2012-06-07 DIAGNOSIS — S60222A Contusion of left hand, initial encounter: Secondary | ICD-10-CM

## 2012-06-07 DIAGNOSIS — F17200 Nicotine dependence, unspecified, uncomplicated: Secondary | ICD-10-CM | POA: Insufficient documentation

## 2012-06-07 DIAGNOSIS — Z880 Allergy status to penicillin: Secondary | ICD-10-CM | POA: Insufficient documentation

## 2012-06-07 DIAGNOSIS — Y9389 Activity, other specified: Secondary | ICD-10-CM | POA: Insufficient documentation

## 2012-06-07 DIAGNOSIS — Z79899 Other long term (current) drug therapy: Secondary | ICD-10-CM | POA: Insufficient documentation

## 2012-06-07 DIAGNOSIS — Y92410 Unspecified street and highway as the place of occurrence of the external cause: Secondary | ICD-10-CM | POA: Insufficient documentation

## 2012-06-07 DIAGNOSIS — S60229A Contusion of unspecified hand, initial encounter: Secondary | ICD-10-CM | POA: Insufficient documentation

## 2012-06-07 MED ORDER — OXYCODONE-ACETAMINOPHEN 5-325 MG PO TABS: 1.0000 | ORAL_TABLET | Freq: Four times a day (QID) | ORAL | Status: DC | PRN

## 2012-06-07 NOTE — ED Notes (Signed)
Ice pack given

## 2012-06-07 NOTE — ED Notes (Signed)
Pt asking for pain medication during discharge, advised pt that I would ask Dr. Rubin Payor if she could have something, pt states that she did not want to wait.

## 2012-06-07 NOTE — ED Provider Notes (Signed)
History    This chart was scribed for [employer]. Rubin Payor, MD by Quintella Reichert, ED scribe.  This patient was seen in room APA12/APA12 and the patient's care was started at 5:49 PM.   CSN: [id number]  Arrival date & time 06/07/12  1635      Chief Complaint  Patient presents with  . Hand Pain     The history is provided by the patient. No language interpreter was used.    HPI Comments: Sandra [name] is a 37 y.o. female who presents to the Emergency Department complaining of constant, moderate left hand pain that began subsequent to an injury several hours ago.  Pt states she accidentally shut the lateral part of her hand in the door of her husband's truck.  She denies fall, head injury or LOC.  She notes pain to the area of injury and radiates partway up her left forearm.  She also notes some visible swelling at the injured area.  She denies numbness, tingling or weakness.   She denies any other medical problems.   History reviewed. No pertinent past medical history.  Past Surgical History  Procedure Laterality Date  . Abdominal hysterectomy      No family history on file.  History  Substance Use Topics  . Smoking status: Current Every Day Smoker -- 0.50 packs/day  . Smokeless tobacco: Not on file  . Alcohol Use: No    OB History   Grav Para Term Preterm Abortions TAB SAB Ect Mult Living                  Review of Systems  Gastrointestinal: Negative for nausea and vomiting.  Musculoskeletal:       Left hand pain  Neurological: Negative for weakness and numbness.    Allergies  Metronidazole; Codeine; Loratadine; and Penicillins  Home Medications   Current Outpatient Rx  Name  Route  Sig  Dispense  Refill  . alprazolam (XANAX) 2 MG tablet   Oral   Take 2 mg by mouth 3 (three) times daily.         Marland Kitchen lisdexamfetamine (VYVANSE) 60 MG capsule   Oral   Take 60 mg by mouth every morning.         . traMADol (ULTRAM) 50 MG tablet   Oral   Take 50 mg  by mouth every 6 (six) hours as needed. pain         . traZODone (DESYREL) 50 MG tablet   Oral   Take 50 mg by mouth at bedtime.         Marland Kitchen oxyCODONE-acetaminophen (PERCOCET/ROXICET) 5-325 MG per tablet   Oral   Take 1-2 tablets by mouth every 6 (six) hours as needed for pain.   10 tablet   0     BP 126/79  Pulse 86  Temp(Src) 98 F (36.7 C) (Oral)  Resp 20  Ht 4\' 8"  (1.422 m)  Wt 166 lb (75.297 kg)  BMI 37.24 kg/m2  SpO2 100%  Physical Exam  Nursing note and vitals reviewed. Constitutional: She is oriented to person, place, and time. She appears well-developed and well-nourished. No distress.  HENT:  Head: Normocephalic and atraumatic.  Eyes: Conjunctivae and EOM are normal. Pupils are equal, round, and reactive to light.  Neck: Normal range of motion. Neck supple.  Cardiovascular: Normal rate and regular rhythm.   Pulmonary/Chest: Effort normal. No respiratory distress.  Musculoskeletal: She exhibits tenderness.  Tenderness over hypothenar area of left hand No bony  crepitance Mild bony tenderness over MCP joint of 5th finger Neurvascularly intact distally Decreased active flexion and extension of left little finger Passive ROM intact Sensation intact over finger No tenderness over wrist  Neurological: She is alert and oriented to person, place, and time. Coordination normal.  Skin: Skin is warm and dry. No rash noted.  No ecchymosis Skin intact  Psychiatric: She has a normal mood and affect. Her behavior is normal.    ED Course  Procedures (including critical care time)   COORDINATION OF CARE: 5:52 PM-Informed pt that x-ray ruled out fracture.  Discussed treatment plan which includes immobilization, pain medication and f/u with PCP if pain does not improve within 1 week with pt at bedside and pt agreed to plan.     Dg Hand Complete Left  06/07/2012   *RADIOLOGY REPORT*  Clinical Data: Closed in car door.  LEFT HAND - COMPLETE 3+ VIEW  Comparison: None.   Findings: No acute bony abnormality.  Specifically, no fracture, subluxation, or dislocation.  Soft tissues are intact. Joint spaces are maintained.  Normal bone mineralization.  IMPRESSION: No bony abnormality.   Original Report Authenticated By: Charlett Nose, M.D.       1. Hand contusion, left, initial encounter       MDM  Patient with left hand pain after getting shut in car door.negative x-rays. Neurovascular intact. Will splint for comfort and will have followup with Ortho if needed in a week.      I personally performed the services described in this documentation, which was scribed in my presence. The recorded information has been reviewed and is accurate.     Juliet Rude. Rubin Payor, MD 06/07/12 [phone]

## 2012-06-07 NOTE — ED Notes (Signed)
Pt c/o accidentally slamming her left hand in car door earlier today, pain is located on back on hand below pinkie finger, cms intact.

## 2012-06-07 NOTE — ED Notes (Signed)
Pt reports shut her left hand in truck door.  Pain is to lateral part of left hand, some swelling noted.  Can wiggle fingers but says  is very painful.

## 2012-06-07 NOTE — ED Notes (Signed)
Dr Pickering at bedside 

## 2013-01-06 ENCOUNTER — Emergency Department (HOSPITAL_COMMUNITY)
Admission: EM | Admit: 2013-01-06 | Discharge: 2013-01-06 | Disposition: A | Discharge status: Home / Self Care | Payer: Medicaid Other | Attending: Emergency Medicine | Admitting: Emergency Medicine

## 2013-01-06 ENCOUNTER — Encounter (HOSPITAL_COMMUNITY): Payer: Self-pay | Admitting: Emergency Medicine

## 2013-01-06 DIAGNOSIS — F17200 Nicotine dependence, unspecified, uncomplicated: Secondary | ICD-10-CM | POA: Insufficient documentation

## 2013-01-06 DIAGNOSIS — Z880 Allergy status to penicillin: Secondary | ICD-10-CM | POA: Insufficient documentation

## 2013-01-06 DIAGNOSIS — R52 Pain, unspecified: Secondary | ICD-10-CM | POA: Insufficient documentation

## 2013-01-06 DIAGNOSIS — J029 Acute pharyngitis, unspecified: Secondary | ICD-10-CM | POA: Insufficient documentation

## 2013-01-06 DIAGNOSIS — J329 Chronic sinusitis, unspecified: Secondary | ICD-10-CM

## 2013-01-06 DIAGNOSIS — Z79899 Other long term (current) drug therapy: Secondary | ICD-10-CM | POA: Insufficient documentation

## 2013-01-06 DIAGNOSIS — Z792 Long term (current) use of antibiotics: Secondary | ICD-10-CM | POA: Insufficient documentation

## 2013-01-06 MED ORDER — POLYMYXIN B-TRIMETHOPRIM 10000-0.1 UNIT/ML-% OP SOLN: 1.0000 [drp] | OPHTHALMIC | Status: DC

## 2013-01-06 MED ORDER — AZITHROMYCIN 250 MG PO TABS: 250.0000 mg | ORAL_TABLET | Freq: Every day | ORAL | Status: DC

## 2013-01-06 NOTE — ED Notes (Signed)
C/o sore throat, cough, nasal congestion, chills and gen. Body aches, denies fever or N/V/D

## 2013-01-06 NOTE — ED Provider Notes (Signed)
Medical screening examination/treatment/procedure(s) were performed by non-physician practitioner and as supervising physician I was immediately available for consultation/collaboration.  EKG Interpretation   None         Tacey Dimaggio L Lynda Wanninger, MD 01/06/13 2016 

## 2013-01-06 NOTE — ED Notes (Signed)
Pt seen and evaluated by EDPa for initial assessment. 

## 2013-01-06 NOTE — ED Provider Notes (Signed)
CSN: [id number]     Arrival date & time 01/06/13  1656 History   First MD Initiated Contact with Patient 01/06/13 1800     Chief Complaint  Patient presents with  . Chills  . Cough  . Headache  . Nasal Congestion   (Consider location/radiation/quality/duration/timing/severity/associated sxs/prior Treatment) HPI Comments: Patient presents to the emergency department with chief complaint of sinus pressure, nasal congestion, cough sore throat, chills, and generalized body aches. She denies nausea, vomiting, diarrhea, or constipation. Denies any dysuria, vaginal discharge, or bleeding. She states that her symptoms started approximately one week ago, and then resolved, but after Christmas they worsened significantly. She states that the sinus pressure has also increased. She denies fevers. She states that she is tried taking NyQuil with good relief. She is concerned she has sinus infection. She also states that she has noticed some crustiness to her right eye in the morning, and that it is itchy and watery. She denies getting anything in her eye.  The history is provided by the patient. No language interpreter was used.    History reviewed. No pertinent past medical history. Past Surgical History  Procedure Laterality Date  . Abdominal hysterectomy     History reviewed. No pertinent family history. History  Substance Use Topics  . Smoking status: Current Every Day Smoker -- 0.50 packs/day    Types: Cigarettes  . Smokeless tobacco: Not on file  . Alcohol Use: No   OB History   Grav Para Term Preterm Abortions TAB SAB Ect Mult Living                 Review of Systems  All other systems reviewed and are negative.    Allergies  Metronidazole; Codeine; Loratadine; and Penicillins  Home Medications   Current Outpatient Rx  Name  Route  Sig  Dispense  Refill  . alprazolam (XANAX) 2 MG tablet   Oral   Take 2 mg by mouth 3 (three) times daily.         Marland Kitchen azithromycin (ZITHROMAX  Z-PAK) 250 MG tablet   Oral   Take 1 tablet (250 mg total) by mouth daily. 500mg  PO day 1, then 250mg  PO days 205   6 tablet   0   . lisdexamfetamine (VYVANSE) 60 MG capsule   Oral   Take 60 mg by mouth every morning.         Marland Kitchen oxyCODONE-acetaminophen (PERCOCET/ROXICET) 5-325 MG per tablet   Oral   Take 1-2 tablets by mouth every 6 (six) hours as needed for pain.   10 tablet   0   . traMADol (ULTRAM) 50 MG tablet   Oral   Take 50 mg by mouth every 6 (six) hours as needed. pain         . traZODone (DESYREL) 50 MG tablet   Oral   Take 50 mg by mouth at bedtime.         Marland Kitchen trimethoprim-polymyxin b (POLYTRIM) ophthalmic solution   Right Eye   Place 1 drop into the right eye every 4 (four) hours.   10 mL   0    BP 116/74  Pulse 90  Temp(Src) 98.1 F (36.7 C) (Oral)  Resp 20  Ht 5' (1.524 m)  Wt 171 lb (77.565 kg)  BMI 33.40 kg/m2  SpO2 96% Physical Exam  Nursing note and vitals reviewed. Constitutional: She is oriented to person, place, and time. She appears well-developed and well-nourished.  HENT:  Head: Normocephalic and atraumatic.  Right Ear: External ear normal.  Left Ear: External ear normal.  Mouth/Throat: Oropharynx is clear and moist. No oropharyngeal exudate.  Swollen, erythematous turbinates, maxillary sinuses tender to palpation, oropharynx is mildly erythematous, uvula is midline, airway is intact, no evidence of tonsillar or peritonsillar abscesses, no tonsillar exudate  Eyes: Conjunctivae and EOM are normal. Pupils are equal, round, and reactive to light.  Right conjunctiva is mildly injected, no evidence of foreign body, no pain with eye movement, no consensual photophobia  Neck: Normal range of motion. Neck supple.  Cardiovascular: Normal rate, regular rhythm and normal heart sounds.   Pulmonary/Chest: Effort normal and breath sounds normal. No respiratory distress. She has no wheezes. She has no rales. She exhibits no tenderness.  Abdominal:  Soft. She exhibits no distension and no mass. There is no tenderness. There is no rebound and no guarding.  No abdominal tenderness  Musculoskeletal: Normal range of motion.  Neurological: She is alert and oriented to person, place, and time.  Skin: Skin is warm and dry.  Psychiatric: She has a normal mood and affect. Her behavior is normal. Judgment and thought content normal.    ED Course  Procedures (including critical care time) Labs Review Labs Reviewed - No data to display Imaging Review No results found.  EKG Interpretation   None       MDM   1. Sinusitis     Patient with possible sinusitis, she has had double sickening, now has increased sinus pressure and tenderness to palpation. Will treat with azithromycin. Patient also is concerned about an eye infection. I doubt this, and suspect that it is likely related to her sinuses, but will give her some Polytrim drops as she reports crustiness in the mornings. Vital signs are stable. Patient is stable and ready for discharge. Return for new or worsening symptoms.   Roxy Horseman, PA-C 01/06/13 [phone]

## 2013-01-12 HISTORY — PX: MULTIPLE TOOTH EXTRACTIONS: SHX2053

## 2013-03-28 ENCOUNTER — Encounter (HOSPITAL_COMMUNITY): Payer: Self-pay | Admitting: Emergency Medicine

## 2013-03-28 ENCOUNTER — Emergency Department (HOSPITAL_COMMUNITY)
Admission: EM | Admit: 2013-03-28 | Discharge: 2013-03-28 | Disposition: A | Discharge status: Home / Self Care | Payer: Medicaid Other | Attending: Emergency Medicine | Admitting: Emergency Medicine

## 2013-03-28 DIAGNOSIS — R5381 Other malaise: Secondary | ICD-10-CM | POA: Insufficient documentation

## 2013-03-28 DIAGNOSIS — R52 Pain, unspecified: Secondary | ICD-10-CM | POA: Insufficient documentation

## 2013-03-28 DIAGNOSIS — R5383 Other fatigue: Secondary | ICD-10-CM

## 2013-03-28 DIAGNOSIS — F17200 Nicotine dependence, unspecified, uncomplicated: Secondary | ICD-10-CM | POA: Insufficient documentation

## 2013-03-28 DIAGNOSIS — Z8541 Personal history of malignant neoplasm of cervix uteri: Secondary | ICD-10-CM | POA: Insufficient documentation

## 2013-03-28 DIAGNOSIS — Z79899 Other long term (current) drug therapy: Secondary | ICD-10-CM | POA: Insufficient documentation

## 2013-03-28 DIAGNOSIS — Z880 Allergy status to penicillin: Secondary | ICD-10-CM | POA: Insufficient documentation

## 2013-03-28 DIAGNOSIS — R0981 Nasal congestion: Secondary | ICD-10-CM

## 2013-03-28 DIAGNOSIS — J3489 Other specified disorders of nose and nasal sinuses: Secondary | ICD-10-CM | POA: Insufficient documentation

## 2013-03-28 MED ORDER — FLUTICASONE PROPIONATE 50 MCG/ACT NA SUSP: 2.0000 | Freq: Every day | NASAL | Status: DC

## 2013-03-28 NOTE — ED Notes (Signed)
Pt reports stuffy nose w/ drainage, pt having body aches & weakness.

## 2013-03-28 NOTE — ED Provider Notes (Signed)
CSN: [id number]     Arrival date & time 03/28/13  2105 History   First MD Initiated Contact with Patient 03/28/13 2138     Chief Complaint  Patient presents with  . Nasal Congestion  . Chills     (Consider location/radiation/quality/duration/timing/severity/associated sxs/prior Treatment) The history is provided by the patient. No language interpreter was used.   Patient is a 38 year old female who presents with nasal congestion, fatigue and body aches. She reports that she has been feeling bad since yesterday. She denies cough, shortness of breath, difficulty breathing or wheezing. She reports that she has seasonal allergies. She is unsure if she has had fever.     Past Medical History  Diagnosis Date  . Cervical cancer    Past Surgical History  Procedure Laterality Date  . Abdominal hysterectomy    . Tonsillectomy    . Adenoidectomy     History reviewed. No pertinent family history. History  Substance Use Topics  . Smoking status: Current Every Day Smoker -- 0.50 packs/day    Types: Cigarettes  . Smokeless tobacco: Not on file  . Alcohol Use: No   OB History   Grav Para Term Preterm Abortions TAB SAB Ect Mult Living                 Review of Systems  Constitutional: Negative for fever and chills.  HENT: Positive for congestion.   Respiratory: Negative for shortness of breath and wheezing.   Cardiovascular: Negative for chest pain.  Skin: Negative for rash.  All other systems reviewed and are negative.      Allergies  Metronidazole; Codeine; Loratadine; and Penicillins  Home Medications   Current Outpatient Rx  Name  Route  Sig  Dispense  Refill  . alprazolam (XANAX) 2 MG tablet   Oral   Take 2 mg by mouth 3 (three) times daily.         Marland Kitchen azithromycin (ZITHROMAX Z-PAK) 250 MG tablet   Oral   Take 1 tablet (250 mg total) by mouth daily. 500mg  PO day 1, then 250mg  PO days 205   6 tablet   0   . fluticasone (FLONASE) 50 MCG/ACT nasal spray   Each  Nare   Place 2 sprays into both nostrils daily.   9.9 g   2   . lisdexamfetamine (VYVANSE) 60 MG capsule   Oral   Take 60 mg by mouth every morning.         Marland Kitchen oxyCODONE-acetaminophen (PERCOCET/ROXICET) 5-325 MG per tablet   Oral   Take 1-2 tablets by mouth every 6 (six) hours as needed for pain.   10 tablet   0   . traMADol (ULTRAM) 50 MG tablet   Oral   Take 50 mg by mouth every 6 (six) hours as needed. pain         . traZODone (DESYREL) 50 MG tablet   Oral   Take 50 mg by mouth at bedtime.         Marland Kitchen trimethoprim-polymyxin b (POLYTRIM) ophthalmic solution   Right Eye   Place 1 drop into the right eye every 4 (four) hours.   10 mL   0    BP 139/93  Pulse 113  Temp(Src) 99.2 F (37.3 C) (Oral)  Resp 18  Ht 4\' 11"  (1.499 m)  Wt 176 lb (79.833 kg)  BMI 35.53 kg/m2  SpO2 96% Physical Exam  Nursing note and vitals reviewed. Constitutional: She is oriented to person, place, and time. She  appears well-developed and well-nourished. No distress.  HENT:  Head: Normocephalic and atraumatic.  Right Ear: External ear normal.  Left Ear: External ear normal.  Mouth/Throat: Oropharynx is clear and moist.  Eyes: Conjunctivae and EOM are normal. Pupils are equal, round, and reactive to light.  Neck: Normal range of motion. Neck supple. No JVD present. No tracheal deviation present. No thyromegaly present.  Cardiovascular: Normal rate, regular rhythm, normal heart sounds and intact distal pulses.   Pulmonary/Chest: Effort normal and breath sounds normal. No respiratory distress. She has no wheezes. She has no rales. She exhibits no tenderness.  Musculoskeletal: Normal range of motion.  Lymphadenopathy:    She has no cervical adenopathy.  Neurological: She is alert and oriented to person, place, and time.  Skin: Skin is warm and dry. No rash noted.  Psychiatric: She has a normal mood and affect. Her behavior is normal. Judgment and thought content normal.    ED Course   Procedures (including critical care time) Labs Review Labs Reviewed - No data to display Imaging Review No results found.   EKG Interpretation None      MDM   Final diagnoses:  Nasal sinus congestion    Stuffy nose, nasal congestion and generalized aches. No shortness of breath, chest pain or difficulty breathing. Probable viral cold. Discussed plan of care with pt and she agrees. Fluticasone nasal spray prescribed and return precautions given.       Elisha Headland, NP 04/02/13 1521

## 2013-03-28 NOTE — Discharge Instructions (Signed)
Upper Respiratory Infection, Adult An upper respiratory infection (URI) is also sometimes known as the common cold. The upper respiratory tract includes the nose, sinuses, throat, trachea, and bronchi. Bronchi are the airways leading to the lungs. Most people improve within 1 week, but symptoms can last up to 2 weeks. A residual cough may last even longer.  CAUSES Many different viruses can infect the tissues lining the upper respiratory tract. The tissues become irritated and inflamed and often become very moist. Mucus production is also common. A cold is contagious. You can easily spread the virus to others by oral contact. This includes kissing, sharing a glass, coughing, or sneezing. Touching your mouth or nose and then touching a surface, which is then touched by another person, can also spread the virus. SYMPTOMS  Symptoms typically develop 1 to 3 days after you come in contact with a cold virus. Symptoms vary from person to person. They may include:  Runny nose.  Sneezing.  Nasal congestion.  Sinus irritation.  Sore throat.  Loss of voice (laryngitis).  Cough.  Fatigue.  Muscle aches.  Loss of appetite.  Headache.  Low-grade fever. DIAGNOSIS  You might diagnose your own cold based on familiar symptoms, since most people get a cold 2 to 3 times a year. Your caregiver can confirm this based on your exam. Most importantly, your caregiver can check that your symptoms are not due to another disease such as strep throat, sinusitis, pneumonia, asthma, or epiglottitis. Blood tests, throat tests, and X-rays are not necessary to diagnose a common cold, but they may sometimes be helpful in excluding other more serious diseases. Your caregiver will decide if any further tests are required. RISKS AND COMPLICATIONS  You may be at risk for a more severe case of the common cold if you smoke cigarettes, have chronic heart disease (such as heart failure) or lung disease (such as asthma), or if  you have a weakened immune system. The very young and very old are also at risk for more serious infections. Bacterial sinusitis, middle ear infections, and bacterial pneumonia can complicate the common cold. The common cold can worsen asthma and chronic obstructive pulmonary disease (COPD). Sometimes, these complications can require emergency medical care and may be life-threatening. PREVENTION  The best way to protect against getting a cold is to practice good hygiene. Avoid oral or hand contact with people with cold symptoms. Wash your hands often if contact occurs. There is no clear evidence that vitamin C, vitamin E, echinacea, or exercise reduces the chance of developing a cold. However, it is always recommended to get plenty of rest and practice good nutrition. TREATMENT  Treatment is directed at relieving symptoms. There is no cure. Antibiotics are not effective, because the infection is caused by a virus, not by bacteria. Treatment may include:  Increased fluid intake. Sports drinks offer valuable electrolytes, sugars, and fluids.  Breathing heated mist or steam (vaporizer or shower).  Eating chicken soup or other clear broths, and maintaining good nutrition.  Getting plenty of rest.  Using gargles or lozenges for comfort.  Controlling fevers with ibuprofen or acetaminophen as directed by your caregiver.  Increasing usage of your inhaler if you have asthma. Zinc gel and zinc lozenges, taken in the first 24 hours of the common cold, can shorten the duration and lessen the severity of symptoms. Pain medicines may help with fever, muscle aches, and throat pain. A variety of non-prescription medicines are available to treat congestion and runny nose. Your caregiver  can make recommendations and may suggest nasal or lung inhalers for other symptoms.  HOME CARE INSTRUCTIONS   Only take over-the-counter or prescription medicines for pain, discomfort, or fever as directed by your  caregiver.  Use a warm mist humidifier or inhale steam from a shower to increase air moisture. This may keep secretions moist and make it easier to breathe.  Drink enough water and fluids to keep your urine clear or pale yellow.  Rest as needed.  Return to work when your temperature has returned to normal or as your caregiver advises. You may need to stay home longer to avoid infecting others. You can also use a face mask and careful hand washing to prevent spread of the virus. SEEK MEDICAL CARE IF:   After the first few days, you feel you are getting worse rather than better.  You need your caregiver's advice about medicines to control symptoms.  You develop chills, worsening shortness of breath, or brown or red sputum. These may be signs of pneumonia.  You develop yellow or brown nasal discharge or pain in the face, especially when you bend forward. These may be signs of sinusitis.  You develop a fever, swollen neck glands, pain with swallowing, or white areas in the back of your throat. These may be signs of strep throat. SEEK IMMEDIATE MEDICAL CARE IF:   You have a fever.  You develop severe or persistent headache, ear pain, sinus pain, or chest pain.  You develop wheezing, a prolonged cough, cough up blood, or have a change in your usual mucus (if you have chronic lung disease).  You develop sore muscles or a stiff neck. Document Released: 06/24/2000 Document Revised: 03/23/2011 Document Reviewed: 05/02/2010 [facility] Patient Information 2014 [place], [place].   Follow up the care injections Use Flonase as prescribed Drink plenty of fluid Rest Take Zyrtec OTC for allergies Use humidifier

## 2013-03-28 NOTE — ED Notes (Signed)
Pt alert & oriented x4, stable gait. Patient given discharge instructions, paperwork & prescription(s). Patient  instructed to stop at the registration desk to finish any additional paperwork. Patient verbalized understanding. Pt left department w/ no further questions. 

## 2013-03-28 NOTE — ED Notes (Signed)
Patient complaining of cold chills and congestion since yesterday.

## 2013-04-05 NOTE — ED Provider Notes (Signed)
Medical screening examination/treatment/procedure(s) were performed by non-physician practitioner and as supervising physician I was immediately available for consultation/collaboration.   EKG Interpretation None       Nat Christen, MD 04/05/13 1244

## 2014-04-06 ENCOUNTER — Encounter: Payer: Medicaid Other | Admitting: Nutrition

## 2014-04-19 ENCOUNTER — Ambulatory Visit: Payer: Medicaid Other | Admitting: Nutrition

## 2014-04-25 ENCOUNTER — Encounter: Payer: Self-pay | Admitting: Nutrition

## 2014-04-25 ENCOUNTER — Encounter: Payer: Medicaid Other | Attending: Family | Admitting: Nutrition

## 2014-04-25 VITALS — Ht 59.0 in | Wt 193.0 lb

## 2014-04-25 DIAGNOSIS — R7303 Prediabetes: Secondary | ICD-10-CM

## 2014-04-25 DIAGNOSIS — E669 Obesity, unspecified: Secondary | ICD-10-CM | POA: Insufficient documentation

## 2014-04-25 DIAGNOSIS — R7309 Other abnormal glucose: Secondary | ICD-10-CM | POA: Diagnosis not present

## 2014-04-25 DIAGNOSIS — Z6838 Body mass index (BMI) 38.0-38.9, adult: Secondary | ICD-10-CM | POA: Diagnosis not present

## 2014-04-25 DIAGNOSIS — Z713 Dietary counseling and surveillance: Secondary | ICD-10-CM | POA: Insufficient documentation

## 2014-04-25 NOTE — Progress Notes (Signed)
  Medical Nutrition Therapy:  Appt start time: 1430 end time:  6962.  Assessment:  Primary concerns today: Prediabetes and obesity. LIves with her husband and her daughter and friend. Her daughter has type 1 Diabetes and has an insulin pump. She does the shopping and cooking at home.. Most meals are baked and fried. She admits to eating a lot of junk food. Eats only 2 meals per day. Diet is  High in fat, sugar, sodium and low in fresh fruits, vegetables and whole grains. Has had her teeth removed and in the process of getting dentures. He husband has type 2 diabetes.    She wants to lose weight. Willing to start making changes with her diet to improve eating habits for herself and her family. Drinks a lot of sodas, sweet tea and sweets.  Preferred Learning Style:   Auditory  Hands on  Learning Readiness:   Ready  Change in progress  MEDICATIONS: See list.   DIETARY INTAKE:  24-hr recall:  B ( AM): Frosted flakes 2 cup,  Whole milk, Snk ( AM): none L ( PM): Macaroni cheese dinner (4 cups), Soda/Tea Snk ( PM):  D ( PM): 6" in sub ham and cheese and Sweet tea, Snk ( PM):  Beverages: Tea, soda and very little water  Usual physical activity: none Says she has back problems.  Estimated energy needs:  1200calories 135  g carbohydrates 90 g protein 33 g fat  Progress Towards Goal(s):  In progress.   Nutritional Diagnosis:  NB-1.1 Food and nutrition-related knowledge deficit As related to OBesity and prediabetes.  As evidenced by A1C 5.8%   and BMI of  39   Intervention:  Nutrition counseling and prediabetes education on meal planning, CHO counting, My Plate, portion sizes, low fat low sodium high fiber diet and benefits of exercise for needed weight loss. Discussed complications from obesity and prediabetes.  Goals 1. Cut out sodas, tea     Choose a non sugared cereal for breakfast instead of frosted flakes- Try Cherrios, Wheaties or Total.. 2. Increase water intake to 4  bottles per day. 3. Increase low carb vegetables to 2 servings per meal. 4. Walk 15 minutes daily. 5. Lose 1-2 lbs per week til next viisit.  Teaching Method Utilized:  Visual Auditory Hands on  Handouts given during visit include:  The Plate Method  The Meal Plan Card  Diabetes and You  Meal Planning Guide  Barriers to learning/adherence to lifestyle change: None  Demonstrated degree of understanding via:  Teach Back   Monitoring/Evaluation:  Dietary intake, exercise, meal planning, and body weight in 1 month(s)  .

## 2014-04-25 NOTE — Patient Instructions (Signed)
Goals 1. Cut out sodas, tea     Choose a non sugared cereal for breakfast instead of frosted flakes- Try Cherrios, Wheaties or Total.. 2. Increase water intake to 4 bottles per day. 3. Increase low carb vegetables to 2 servings per meal. 4. Walk 15 minutes daily. 5. Lose 1-2 lbs per week til next viisit.

## 2014-06-06 ENCOUNTER — Encounter: Payer: Medicaid Other | Attending: Family | Admitting: Nutrition

## 2014-06-06 DIAGNOSIS — Z6838 Body mass index (BMI) 38.0-38.9, adult: Secondary | ICD-10-CM | POA: Insufficient documentation

## 2014-06-06 DIAGNOSIS — E669 Obesity, unspecified: Secondary | ICD-10-CM | POA: Insufficient documentation

## 2014-06-06 DIAGNOSIS — Z713 Dietary counseling and surveillance: Secondary | ICD-10-CM | POA: Insufficient documentation

## 2014-06-06 DIAGNOSIS — R7309 Other abnormal glucose: Secondary | ICD-10-CM | POA: Insufficient documentation

## 2014-06-08 ENCOUNTER — Ambulatory Visit
Admission: RE | Admit: 2014-06-08 | Discharge: 2014-06-08 | Disposition: A | Discharge status: Home / Self Care | Payer: Disability Insurance | Source: Ambulatory Visit | Attending: Family Medicine | Admitting: Family Medicine

## 2014-06-08 ENCOUNTER — Other Ambulatory Visit: Payer: Self-pay | Admitting: Family Medicine

## 2014-06-08 DIAGNOSIS — F329 Major depressive disorder, single episode, unspecified: Secondary | ICD-10-CM | POA: Diagnosis not present

## 2014-06-08 DIAGNOSIS — I700 Atherosclerosis of aorta: Secondary | ICD-10-CM | POA: Insufficient documentation

## 2014-06-08 DIAGNOSIS — R413 Other amnesia: Secondary | ICD-10-CM | POA: Insufficient documentation

## 2014-06-08 DIAGNOSIS — F909 Attention-deficit hyperactivity disorder, unspecified type: Secondary | ICD-10-CM | POA: Diagnosis not present

## 2014-06-08 DIAGNOSIS — M47896 Other spondylosis, lumbar region: Secondary | ICD-10-CM | POA: Insufficient documentation

## 2014-06-08 DIAGNOSIS — G4700 Insomnia, unspecified: Secondary | ICD-10-CM | POA: Diagnosis not present

## 2014-06-08 DIAGNOSIS — F319 Bipolar disorder, unspecified: Secondary | ICD-10-CM | POA: Diagnosis not present

## 2014-06-08 DIAGNOSIS — E780 Pure hypercholesterolemia: Secondary | ICD-10-CM | POA: Insufficient documentation

## 2014-06-08 DIAGNOSIS — F410 Panic disorder [episodic paroxysmal anxiety] without agoraphobia: Secondary | ICD-10-CM | POA: Insufficient documentation

## 2014-06-08 DIAGNOSIS — R52 Pain, unspecified: Secondary | ICD-10-CM | POA: Insufficient documentation

## 2014-06-08 DIAGNOSIS — M545 Low back pain: Secondary | ICD-10-CM

## 2014-11-25 ENCOUNTER — Encounter (HOSPITAL_COMMUNITY): Payer: Self-pay | Admitting: *Deleted

## 2014-11-25 ENCOUNTER — Emergency Department (HOSPITAL_COMMUNITY)
Admission: EM | Admit: 2014-11-25 | Discharge: 2014-11-25 | Disposition: A | Discharge status: Home / Self Care | Payer: Medicaid Other | Attending: Emergency Medicine | Admitting: Emergency Medicine

## 2014-11-25 ENCOUNTER — Emergency Department (HOSPITAL_COMMUNITY): Payer: Medicaid Other

## 2014-11-25 DIAGNOSIS — Z880 Allergy status to penicillin: Secondary | ICD-10-CM | POA: Insufficient documentation

## 2014-11-25 DIAGNOSIS — F17210 Nicotine dependence, cigarettes, uncomplicated: Secondary | ICD-10-CM | POA: Diagnosis not present

## 2014-11-25 DIAGNOSIS — J159 Unspecified bacterial pneumonia: Secondary | ICD-10-CM | POA: Insufficient documentation

## 2014-11-25 DIAGNOSIS — J189 Pneumonia, unspecified organism: Secondary | ICD-10-CM

## 2014-11-25 DIAGNOSIS — R05 Cough: Secondary | ICD-10-CM | POA: Diagnosis present

## 2014-11-25 DIAGNOSIS — Z7951 Long term (current) use of inhaled steroids: Secondary | ICD-10-CM | POA: Diagnosis not present

## 2014-11-25 DIAGNOSIS — Z8541 Personal history of malignant neoplasm of cervix uteri: Secondary | ICD-10-CM | POA: Diagnosis not present

## 2014-11-25 DIAGNOSIS — Z79899 Other long term (current) drug therapy: Secondary | ICD-10-CM | POA: Insufficient documentation

## 2014-11-25 DIAGNOSIS — Z720 Tobacco use: Secondary | ICD-10-CM

## 2014-11-25 LAB — COMPREHENSIVE METABOLIC PANEL
ALK PHOS: 115 U/L (ref 38–126)
ALT: 24 U/L (ref 14–54)
AST: 26 U/L (ref 15–41)
Albumin: 3.5 g/dL (ref 3.5–5.0)
Anion gap: 9 (ref 5–15)
BILIRUBIN TOTAL: 0.4 mg/dL (ref 0.3–1.2)
BUN: 12 mg/dL (ref 6–20)
CALCIUM: 9.2 mg/dL (ref 8.9–10.3)
CHLORIDE: 103 mmol/L (ref 101–111)
CO2: 26 mmol/L (ref 22–32)
CREATININE: 0.84 mg/dL (ref 0.44–1.00)
Glucose, Bld: 164 mg/dL — ABNORMAL HIGH (ref 65–99)
Potassium: 4.1 mmol/L (ref 3.5–5.1)
Sodium: 138 mmol/L (ref 135–145)
TOTAL PROTEIN: 7.1 g/dL (ref 6.5–8.1)

## 2014-11-25 LAB — CBC WITH DIFFERENTIAL/PLATELET
Basophils Absolute: 0 10*3/uL (ref 0.0–0.1)
Basophils Relative: 0 %
Eosinophils Absolute: 0.2 10*3/uL (ref 0.0–0.7)
Eosinophils Relative: 2 %
HCT: 41.8 % (ref 36.0–46.0)
Hemoglobin: 13.4 g/dL (ref 12.0–15.0)
Lymphocytes Relative: 30 %
Lymphs Abs: 2.5 10*3/uL (ref 0.7–4.0)
MCH: 27.3 pg (ref 26.0–34.0)
MCHC: 32.1 g/dL (ref 30.0–36.0)
MCV: 85.3 fL (ref 78.0–100.0)
Monocytes Absolute: 0.7 10*3/uL (ref 0.1–1.0)
Monocytes Relative: 8 %
Neutro Abs: 5 10*3/uL (ref 1.7–7.7)
Neutrophils Relative %: 60 %
Platelets: 321 10*3/uL (ref 150–400)
RBC: 4.9 MIL/uL (ref 3.87–5.11)
RDW: 13.8 % (ref 11.5–15.5)
WBC: 8.3 10*3/uL (ref 4.0–10.5)

## 2014-11-25 LAB — LIPASE, BLOOD: LIPASE: 21 U/L (ref 11–51)

## 2014-11-25 MED ORDER — ONDANSETRON HCL 4 MG/2ML IJ SOLN
4.0000 mg | Freq: Once | INTRAMUSCULAR | Status: AC
  Administered 2014-11-25: 4 mg via INTRAVENOUS
  Filled 2014-11-25: qty 2

## 2014-11-25 MED ORDER — HYDROCODONE-ACETAMINOPHEN 5-325 MG PO TABS: 1.0000 | ORAL_TABLET | ORAL | Status: DC | PRN

## 2014-11-25 MED ORDER — HYDROMORPHONE HCL 1 MG/ML IJ SOLN
1.0000 mg | Freq: Once | INTRAMUSCULAR | Status: AC
  Administered 2014-11-25: 1 mg via INTRAVENOUS
  Filled 2014-11-25: qty 1

## 2014-11-25 MED ORDER — AZITHROMYCIN 250 MG PO TABS: ORAL_TABLET | ORAL | Status: DC

## 2014-11-25 MED ORDER — DEXTROSE 5 % IV SOLN
500.0000 mg | Freq: Once | INTRAVENOUS | Status: AC
  Administered 2014-11-25: 500 mg via INTRAVENOUS
  Filled 2014-11-25: qty 500

## 2014-11-25 MED ORDER — ALBUTEROL SULFATE (2.5 MG/3ML) 0.083% IN NEBU
5.0000 mg | INHALATION_SOLUTION | Freq: Once | RESPIRATORY_TRACT | Status: AC
  Administered 2014-11-25: 5 mg via RESPIRATORY_TRACT
  Filled 2014-11-25: qty 6

## 2014-11-25 MED ORDER — IPRATROPIUM BROMIDE 0.02 % IN SOLN
0.5000 mg | Freq: Once | RESPIRATORY_TRACT | Status: AC
  Administered 2014-11-25: 0.5 mg via RESPIRATORY_TRACT
  Filled 2014-11-25: qty 2.5

## 2014-11-25 MED ORDER — ONDANSETRON HCL 8 MG PO TABS: 8.0000 mg | ORAL_TABLET | Freq: Three times a day (TID) | ORAL | Status: DC | PRN

## 2014-11-25 MED ORDER — ALBUTEROL SULFATE HFA 108 (90 BASE) MCG/ACT IN AERS
2.0000 | INHALATION_SPRAY | Freq: Once | RESPIRATORY_TRACT | Status: AC
  Administered 2014-11-25: 2 via RESPIRATORY_TRACT
  Filled 2014-11-25: qty 6.7

## 2014-11-25 MED ORDER — AEROCHAMBER Z-STAT PLUS/MEDIUM MISC
1.0000 | Freq: Once | Status: DC
  Filled 2014-11-25: qty 1

## 2014-11-25 NOTE — ED Notes (Signed)
Pt states she starting having a non-productive cough starting 2 weeks ago. Was seen at her primary beginning of last week and told she had bronchitits. Pt was given cough medicine but sees no improvement. Pt states she had one episode of emesis after increased coughing last night. NAD noted.

## 2014-11-25 NOTE — ED Notes (Signed)
Respiratory called

## 2014-11-25 NOTE — ED Provider Notes (Signed)
CSN: [id number]     Arrival date & time 11/25/14  0919 History  By signing my name below, I, Sandra [name], attest that this documentation has been prepared under the direction and in the presence of Daleen Bo, MD. Electronically Signed: Stephania [name], ED Scribe. 11/25/2014. 10:42 AM.   Chief Complaint  Patient presents with  . Cough   The history is provided by the patient. No language interpreter was used.    HPI Comments: Sandra [name] is a 39 y.o. female who presents to the Emergency Department complaining of a constant, worsening, severe dry cough that began 2 weeks ago. She notes post-tussive vomiting that began yesterday and endorses a subjective fever (unmeasured), weakness, and dizziness. Patient states she was seen by her PCP last week and was told she had bronchitis, per nursing notes. She was given cough medication, which she has taken with no relief. She denies being given any antibiotics. She states she is a current everyday smoker.    Past Medical History  Diagnosis Date  . Cervical cancer [facility])    Past Surgical History  Procedure Laterality Date  . Abdominal hysterectomy    . Tonsillectomy    . Adenoidectomy     No family history on file. Social History  Substance Use Topics  . Smoking status: Current Every Day Smoker -- 0.50 packs/day    Types: Cigarettes  . Smokeless tobacco: None  . Alcohol Use: No   OB History    No data available     Review of Systems  All other systems reviewed and are negative.   Allergies  Benazepril; Metronidazole; Codeine; Loratadine; and Penicillins  Home Medications   Prior to Admission medications   Medication Sig Start Date End Date Taking? Authorizing Provider  ALPRAZolam Duanne Moron) 0.5 MG tablet Take 0.5 mg by mouth 3 (three) times daily.   Yes Historical Provider, MD  cetirizine (ZYRTEC) 10 MG tablet Take 1 tablet by mouth daily. 10/09/14  Yes Historical Provider, MD  EQUETRO 300 MG CP12 Take 1 capsule by mouth 2 (two)  times daily. 10/24/14  Yes Historical Provider, MD  FLUoxetine (PROZAC) 10 MG capsule Take 10 mg by mouth every morning. 10/27/14  Yes Historical Provider, MD  fluticasone (FLONASE) 50 MCG/ACT nasal spray Place 2 sprays into both nostrils daily. 03/28/13  Yes Elisha Headland, NP  gabapentin (NEURONTIN) 300 MG capsule Take 300 mg by mouth 3 (three) times daily.   Yes Historical Provider, MD  ibuprofen (ADVIL,MOTRIN) 600 MG tablet Take 600 mg by mouth 4 (four) times daily as needed for moderate pain.  09/06/14  Yes Historical Provider, MD  lisdexamfetamine (VYVANSE) 60 MG capsule Take 60 mg by mouth every morning.   Yes Historical Provider, MD  naproxen (NAPROSYN) 375 MG tablet Take 1 tablet by mouth 2 (two) times daily as needed. 10/09/14  Yes Historical Provider, MD  pantoprazole (PROTONIX) 40 MG tablet Take 1 tablet by mouth daily. 10/09/14  Yes Historical Provider, MD  pravastatin (PRAVACHOL) 40 MG tablet Take 40 mg by mouth daily.   Yes Historical Provider, MD  traMADol (ULTRAM) 50 MG tablet Take 50 mg by mouth every 6 (six) hours as needed. pain   Yes Historical Provider, MD  azithromycin (ZITHROMAX Z-PAK) 250 MG tablet 2 po day one, then 1 daily x 4 days 11/25/14   Daleen Bo, MD  ondansetron (ZOFRAN) 8 MG tablet Take 1 tablet (8 mg total) by mouth every 8 (eight) hours as needed for nausea or vomiting. 11/25/14  Daleen Bo, MD   BP 109/64 mmHg  Pulse 63  Temp(Src) 97.6 F (36.4 C) (Oral)  Resp 18  Ht 4\' 11"  (1.499 m)  Wt 199 lb (90.266 kg)  BMI 40.17 kg/m2  SpO2 98% Physical Exam  Constitutional: She is oriented to person, place, and time. She appears well-developed and well-nourished.  HENT:  Head: Normocephalic and atraumatic.  Eyes: Conjunctivae and EOM are normal. Pupils are equal, round, and reactive to light.  Neck: Normal range of motion and phonation normal. Neck supple.  Cardiovascular: Normal rate and regular rhythm.   Pulmonary/Chest: Effort normal. She has decreased  breath sounds. She has rhonchi. She exhibits no tenderness.  Decreased air movement to left base with rhonchi.   Abdominal: Soft. She exhibits no distension. There is no tenderness. There is no guarding.  Musculoskeletal: Normal range of motion.  Neurological: She is alert and oriented to person, place, and time. She exhibits normal muscle tone.  Skin: Skin is warm and dry.  Psychiatric: She has a normal mood and affect. Her behavior is normal. Judgment and thought content normal.  Nursing note and vitals reviewed.   ED Course  Procedures (including critical care time)  DIAGNOSTIC STUDIES: Oxygen Saturation is 97% on RA, normal by my interpretation.    COORDINATION OF CARE: 9:32 AM - Discussed treatment plan with pt at bedside. Pt verbalized understanding and agreed to plan.   10:46 AM - XR findings discussed with pt. Suspect pneumonia. Will treat with Rx antibiotics.  Medications  aerochamber Z-Stat Plus/medium 1 each (not administered)  ondansetron (ZOFRAN) injection 4 mg (4 mg Intravenous Given 11/25/14 1005)  HYDROmorphone (DILAUDID) injection 1 mg (1 mg Intravenous Given 11/25/14 1005)  albuterol (PROVENTIL) (2.5 MG/3ML) 0.083% nebulizer solution 5 mg (5 mg Nebulization Given 11/25/14 1019)  ipratropium (ATROVENT) nebulizer solution 0.5 mg (0.5 mg Nebulization Given 11/25/14 1019)  azithromycin (ZITHROMAX) 500 mg in dextrose 5 % 250 mL IVPB (0 mg Intravenous Stopped 11/25/14 1204)  HYDROmorphone (DILAUDID) injection 1 mg (1 mg Intravenous Given 11/25/14 1138)  albuterol (PROVENTIL HFA;VENTOLIN HFA) 108 (90 BASE) MCG/ACT inhaler 2 puff (2 puffs Inhalation Given 11/25/14 1302)    Patient Vitals for the past 24 hrs:  BP Temp Temp src Pulse Resp SpO2 Height Weight  11/25/14 1300 109/64 mmHg - - 63 18 - - -  11/25/14 1230 115/78 mmHg - - 71 14 98 % - -  11/25/14 1225 120/84 mmHg - - 68 21 99 % - -  11/25/14 1200 - - - 69 20 100 % - -  11/25/14 1130 (!) 99/49 mmHg - - 80 20 93 %  - -  11/25/14 1105 - - - - - 96 % - -  11/25/14 1100 111/71 mmHg - - 63 16 98 % - -  11/25/14 1030 119/65 mmHg - - 73 23 91 % - -  11/25/14 1019 - - - - - 100 % - -  11/25/14 0930 114/80 mmHg - - 75 - 96 % - -  11/25/14 0927 130/73 mmHg 97.6 F (36.4 C) Oral 76 18 97 % 4\' 11"  (1.499 m) 199 lb (90.266 kg)    12:42 PM Reevaluation with update and discussion. After initial assessment and treatment, an updated evaluation reveals she is comfortable. She is trying to eat.Daleen Bo L   13:20- she is resting, sleeping, arouses easily. She has no further complaints. Findings discussed with patient and husband, all questions were answered.  Labs Review Labs Reviewed  COMPREHENSIVE METABOLIC PANEL -  Abnormal; Notable for the following:    Glucose, Bld 164 (*)    All other components within normal limits  LIPASE, BLOOD  CBC WITH DIFFERENTIAL/PLATELET    Imaging Review Dg Chest Port 1 View  11/25/2014  CLINICAL DATA:  Pt states she starting having a non-productive cough starting 2 weeks ago. Was seen at her primary beginning of last week and told she had bronchitits. Pt was given cough medicine but sees no improvement. Pt states she had one episode of emesis after increased coughing last night. EXAM: PORTABLE CHEST 1 VIEW COMPARISON:  03/01/2011 FINDINGS: There is hazy airspace opacity in the left mid to lower lung consistent with pneumonia. Remainder of the lungs is clear. Heart, mediastinum hila are unremarkable. No convincing pleural effusion.  No pneumothorax. Bony thorax is intact. IMPRESSION: Left mid to lower lung zone pneumonia. Electronically Signed   By: Lajean Manes M.D.   On: 11/25/2014 10:04   I have personally reviewed and evaluated these images and lab results as part of my medical decision-making.   EKG Interpretation   Date/Time:  Sunday November 25 2014 09:54:09 EST Ventricular Rate:  77 PR Interval:  145 QRS Duration: 96 QT Interval:  394 QTC Calculation: 446 R  Axis:   88 Text Interpretation:  Sinus rhythm since last tracing no significant  change Confirmed by Thorn Demas  MD, Arshia Rondon [id number]) on 11/25/2014 10:03:13 AM      MDM   Final diagnoses:  Community acquired pneumonia  Tobacco abuse    Community-acquired pneumonia without evidence for sepsis, metabolic instability or suggestion for impending vascular collapse.   Nursing Notes Reviewed/ Care Coordinated Applicable Imaging Reviewed Interpretation of Laboratory Data incorporated into ED treatment  The patient appears reasonably screened and/or stabilized for discharge and I doubt any other medical condition or other [facility] requiring further screening, evaluation, or treatment in the ED at this time prior to discharge.  Plan: Home Medications- Zithromax, Zofran, albuterol; Home Treatments- stop smoking; return here if the recommended treatment, does not improve the symptoms; Recommended follow up- PCP checkup one week   I personally performed the services described in this documentation, which was scribed in my presence. The recorded information has been reviewed and is accurate.       Daleen Bo, MD 11/25/14 1328

## 2014-11-25 NOTE — ED Notes (Signed)
Pt resting with eyes shut.  Room air sats 88-91%.  Placed on 2 liters [place].

## 2014-11-25 NOTE — Discharge Instructions (Signed)
Get plenty of rest and drink a lot of fluids. Using inhaler 2 puffs every 3 or 4 hours as needed for cough or trouble breathing. Use Robitussin-DM for cough. Use Tylenol if needed for fever.    Community-Acquired Pneumonia, Adult Pneumonia is an infection of the lungs. There are different types of pneumonia. One type can develop while a person is in a hospital. A different type, called community-acquired pneumonia, develops in people who are not, or have not recently been, in the hospital or other health care facility.  CAUSES Pneumonia may be caused by bacteria, viruses, or funguses. Community-acquired pneumonia is often caused by Streptococcus pneumonia bacteria. These bacteria are often passed from one person to another by breathing in droplets from the cough or sneeze of an infected person. RISK FACTORS The condition is more likely to develop in:  People who havechronic diseases, such as chronic obstructive pulmonary disease (COPD), asthma, congestive heart failure, cystic fibrosis, diabetes, or kidney disease.  People who haveearly-stage or late-stage HIV.  People who havesickle cell disease.  People who havehad their spleen removed (splenectomy).  People who havepoor Human resources officer.  People who havemedical conditions that increase the risk of breathing in (aspirating) secretions their own mouth and nose.   People who havea weakened immune system (immunocompromised).  People who smoke.  People whotravel to areas where pneumonia-causing germs commonly exist.  People whoare around animal habitats or animals that have pneumonia-causing germs, including birds, bats, rabbits, cats, and farm animals. SYMPTOMS Symptoms of this condition include:  Adry cough.  A wet (productive) cough.  Fever.  Sweating.  Chest pain, especially when breathing deeply or coughing.  Rapid breathing or difficulty breathing.  Shortness of breath.  Shaking  chills.  Fatigue.  Muscle aches. DIAGNOSIS Your health care provider will take a medical history and perform a physical exam. You may also have other tests, including:  Imaging studies of your chest, including X-rays.  Tests to check your blood oxygen level and other blood gases.  Other tests on blood, mucus (sputum), fluid around your lungs (pleural fluid), and urine. If your pneumonia is severe, other tests may be done to identify the specific cause of your illness. TREATMENT The type of treatment that you receive depends on many factors, such as the cause of your pneumonia, the medicines you take, and other medical conditions that you have. For most adults, treatment and recovery from pneumonia may occur at home. In some cases, treatment must happen in a hospital. Treatment may include:  Antibiotic medicines, if the pneumonia was caused by bacteria.  Antiviral medicines, if the pneumonia was caused by a virus.  Medicines that are given by mouth or through an IV tube.  Oxygen.  Respiratory therapy. Although rare, treating severe pneumonia may include:  Mechanical ventilation. This is done if you are not breathing well on your own and you cannot maintain a safe blood oxygen level.  Thoracentesis. This procedureremoves fluid around one lung or both lungs to help you breathe better. HOME CARE INSTRUCTIONS  Take over-the-counter and prescription medicines only as told by your health care provider.  Only takecough medicine if you are losing sleep. Understand that cough medicine can prevent your body's natural ability to remove mucus from your lungs.  If you were prescribed an antibiotic medicine, take it as told by your health care provider. Do not stop taking the antibiotic even if you start to feel better.  Sleep in a semi-upright position at night. Try sleeping in a reclining  chair, or place a few pillows under your head.  Do not use tobacco products, including cigarettes,  chewing tobacco, and e-cigarettes. If you need help quitting, ask your health care provider.  Drink enough water to keep your urine clear or pale yellow. This will help to thin out mucus secretions in your lungs. PREVENTION There are ways that you can decrease your risk of developing community-acquired pneumonia. Consider getting a pneumococcal vaccine if:  You are older than 41 years of age.  You are older than 41 years of age and are undergoing cancer treatment, have chronic lung disease, or have other medical conditions that affect your immune system. Ask your health care provider if this applies to you. There are different types and schedules of pneumococcal vaccines. Ask your health care provider which vaccination option is best for you. You may also prevent community-acquired pneumonia if you take these actions:  Get an influenza vaccine every year. Ask your health care provider which type of influenza vaccine is best for you.  Go to the dentist on a regular basis.  Wash your hands often. Use hand sanitizer if soap and water are not available. SEEK MEDICAL CARE IF:  You have a fever.  You are losing sleep because you cannot control your cough with cough medicine. SEEK IMMEDIATE MEDICAL CARE IF:  You have worsening shortness of breath.  You have increased chest pain.  Your sickness becomes worse, especially if you are an older adult or have a weakened immune system.  You cough up blood.   This information is not intended to replace advice given to you by your health care provider. Make sure you discuss any questions you have with your health care provider.   Document Released: 12/29/2004 Document Revised: 09/19/2014 Document Reviewed: 04/25/2014 Elsevier Interactive Patient Education 2016 [employer].  Smoking Cessation, Tips for Success If you are ready to quit smoking, congratulations! You have chosen to help yourself be healthier. Cigarettes bring nicotine, tar, carbon  monoxide, and other irritants into your body. Your lungs, heart, and blood vessels will be able to work better without these poisons. There are many different ways to quit smoking. Nicotine gum, nicotine patches, a nicotine inhaler, or nicotine nasal spray can help with physical craving. Hypnosis, support groups, and medicines help break the habit of smoking. WHAT THINGS CAN I DO TO MAKE QUITTING EASIER?  Here are some tips to help you quit for good:  Pick a date when you will quit smoking completely. Tell all of your friends and family about your plan to quit on that date.  Do not try to slowly cut down on the number of cigarettes you are smoking. Pick a quit date and quit smoking completely starting on that day.  Throw away all cigarettes.   Clean and remove all ashtrays from your home, work, and car.  On a card, write down your reasons for quitting. Carry the card with you and read it when you get the urge to smoke.  Cleanse your body of nicotine. Drink enough water and fluids to keep your urine clear or pale yellow. Do this after quitting to flush the nicotine from your body.  Learn to predict your moods. Do not let a bad situation be your excuse to have a cigarette. Some situations in your life might tempt you into wanting a cigarette.  Never have "just one" cigarette. It leads to wanting another and another. Remind yourself of your decision to quit.  Change habits associated with smoking.  If you smoked while driving or when feeling stressed, try other activities to replace smoking. Stand up when drinking your coffee. Brush your teeth after eating. Sit in a different chair when you read the paper. Avoid alcohol while trying to quit, and try to drink fewer caffeinated beverages. Alcohol and caffeine may urge you to smoke.  Avoid foods and drinks that can trigger a desire to smoke, such as sugary or spicy foods and alcohol.  Ask people who smoke not to smoke around you.  Have something  planned to do right after eating or having a cup of coffee. For example, plan to take a walk or exercise.  Try a relaxation exercise to calm you down and decrease your stress. Remember, you may be tense and nervous for the first 2 weeks after you quit, but this will pass.  Find new activities to keep your hands busy. Play with a pen, coin, or rubber band. Doodle or draw things on paper.  Brush your teeth right after eating. This will help cut down on the craving for the taste of tobacco after meals. You can also try mouthwash.   Use oral substitutes in place of cigarettes. Try using lemon drops, carrots, cinnamon sticks, or chewing gum. Keep them handy so they are available when you have the urge to smoke.  When you have the urge to smoke, try deep breathing.  Designate your home as a nonsmoking area.  If you are a heavy smoker, ask your health care provider about a prescription for nicotine chewing gum. It can ease your withdrawal from nicotine.  Reward yourself. Set aside the cigarette money you save and buy yourself something nice.  Look for support from others. Join a support group or smoking cessation program. Ask someone at home or at work to help you with your plan to quit smoking.  Always ask yourself, "Do I need this cigarette or is this just a reflex?" Tell yourself, "Today, I choose not to smoke," or "I do not want to smoke." You are reminding yourself of your decision to quit.  Do not replace cigarette smoking with electronic cigarettes (commonly called e-cigarettes). The safety of e-cigarettes is unknown, and some may contain harmful chemicals.  If you relapse, do not give up! Plan ahead and think about what you will do the next time you get the urge to smoke. HOW WILL I FEEL WHEN I QUIT SMOKING? You may have symptoms of withdrawal because your body is used to nicotine (the addictive substance in cigarettes). You may crave cigarettes, be irritable, feel very hungry, cough  often, get headaches, or have difficulty concentrating. The withdrawal symptoms are only temporary. They are strongest when you first quit but will go away within 10-14 days. When withdrawal symptoms occur, stay in control. Think about your reasons for quitting. Remind yourself that these are signs that your body is healing and getting used to being without cigarettes. Remember that withdrawal symptoms are easier to treat than the major diseases that smoking can cause.  Even after the withdrawal is over, expect periodic urges to smoke. However, these cravings are generally short lived and will go away whether you smoke or not. Do not smoke! WHAT RESOURCES ARE AVAILABLE TO HELP ME QUIT SMOKING? Your health care provider can direct you to community resources or hospitals for support, which may include:  Group support.  Education.  Hypnosis.  Therapy.   This information is not intended to replace advice given to you by your health care  provider. Make sure you discuss any questions you have with your health care provider.   Document Released: 09/27/2003 Document Revised: 01/19/2014 Document Reviewed: 06/16/2012 Elsevier Interactive Patient Education 2016 [employer].  Steps to Quit Smoking  Smoking tobacco can be harmful to your health and can affect almost every organ in your body. Smoking puts you, and those around you, at risk for developing many serious chronic diseases. Quitting smoking is difficult, but it is one of the best things that you can do for your health. It is never too late to quit. WHAT ARE THE BENEFITS OF QUITTING SMOKING? When you quit smoking, you lower your risk of developing serious diseases and conditions, such as:  Lung cancer or lung disease, such as COPD.  Heart disease.  Stroke.  Heart attack.  Infertility.  Osteoporosis and bone fractures. Additionally, symptoms such as coughing, wheezing, and shortness of breath may get better when you quit. You may also  find that you get sick less often because your body is stronger at fighting off colds and infections. If you are pregnant, quitting smoking can help to reduce your chances of having a baby of low birth weight. HOW DO I GET READY TO QUIT? When you decide to quit smoking, create a plan to make sure that you are successful. Before you quit:  Pick a date to quit. Set a date within the next two weeks to give you time to prepare.  Write down the reasons why you are quitting. Keep this list in places where you will see it often, such as on your bathroom mirror or in your car or wallet.  Identify the people, places, things, and activities that make you want to smoke (triggers) and avoid them. Make sure to take these actions:  Throw away all cigarettes at home, at work, and in your car.  Throw away smoking accessories, such as Scientist, research (medical).  Clean your car and make sure to empty the ashtray.  Clean your home, including curtains and carpets.  Tell your family, friends, and coworkers that you are quitting. Support from your loved ones can make quitting easier.  Talk with your health care provider about your options for quitting smoking.  Find out what treatment options are covered by your health insurance. WHAT STRATEGIES CAN I USE TO QUIT SMOKING?  Talk with your healthcare provider about different strategies to quit smoking. Some strategies include:  Quitting smoking altogether instead of gradually lessening how much you smoke over a period of time. Research shows that quitting "cold [place]" is more successful than gradually quitting.  Attending in-person counseling to help you build problem-solving skills. You are more likely to have success in quitting if you attend several counseling sessions. Even short sessions of 10 minutes can be effective.  Finding resources and support systems that can help you to quit smoking and remain smoke-free after you quit. These resources are most  helpful when you use them often. They can include:  Online chats with a Social worker.  Telephone quitlines.  Printed Furniture conservator/restorer.  Support groups or group counseling.  Text messaging programs.  Mobile phone applications.  Taking medicines to help you quit smoking. (If you are pregnant or breastfeeding, talk with your health care provider first.) Some medicines contain nicotine and some do not. Both types of medicines help with cravings, but the medicines that include nicotine help to relieve withdrawal symptoms. Your health care provider may recommend:  Nicotine patches, gum, or lozenges.  Nicotine inhalers or sprays.  Non-nicotine medicine that is taken by mouth. Talk with your health care provider about combining strategies, such as taking medicines while you are also receiving in-person counseling. Using these two strategies together makes you more likely to succeed in quitting than if you used either strategy on its own. If you are pregnant or breastfeeding, talk with your health care provider about finding counseling or other support strategies to quit smoking. Do not take medicine to help you quit smoking unless told to do so by your health care provider. WHAT THINGS CAN I DO TO MAKE IT EASIER TO QUIT? Quitting smoking might feel overwhelming at first, but there is a lot that you can do to make it easier. Take these important actions:  Reach out to your family and friends and ask that they support and encourage you during this time. Call telephone quitlines, reach out to support groups, or work with a counselor for support.  Ask people who smoke to avoid smoking around you.  Avoid places that trigger you to smoke, such as bars, parties, or smoke-break areas at work.  Spend time around people who do not smoke.  Lessen stress in your life, because stress can be a smoking trigger for some people. To lessen stress, try:  Exercising regularly.  Deep-breathing  exercises.  Yoga.  Meditating.  Performing a body scan. This involves closing your eyes, scanning your body from head to toe, and noticing which parts of your body are particularly tense. Purposefully relax the muscles in those areas.  Download or purchase mobile phone or tablet apps (applications) that can help you stick to your quit plan by providing reminders, tips, and encouragement. There are many free apps, such as QuitGuide from the [employer] Office manager for Disease Control and Prevention). You can find other support for quitting smoking (smoking cessation) through smokefree.gov and other websites. HOW WILL I FEEL WHEN I QUIT SMOKING? Within the first 24 hours of quitting smoking, you may start to feel some withdrawal symptoms. These symptoms are usually most noticeable 2-3 days after quitting, but they usually do not last beyond 2-3 weeks. Changes or symptoms that you might experience include:  Mood swings.  Restlessness, anxiety, or irritation.  Difficulty concentrating.  Dizziness.  Strong cravings for sugary foods in addition to nicotine.  Mild weight gain.  Constipation.  Nausea.  Coughing or a sore throat.  Changes in how your medicines work in your body.  A depressed mood.  Difficulty sleeping (insomnia). After the first 2-3 weeks of quitting, you may start to notice more positive results, such as:  Improved sense of smell and taste.  Decreased coughing and sore throat.  Slower heart rate.  Lower blood pressure.  Clearer skin.  The ability to breathe more easily.  Fewer sick days. Quitting smoking is very challenging for most people. Do not get discouraged if you are not successful the first time. Some people need to make many attempts to quit before they achieve long-term success. Do your best to stick to your quit plan, and talk with your health care provider if you have any questions or concerns.   This information is not intended to replace advice given to  you by your health care provider. Make sure you discuss any questions you have with your health care provider.   Document Released: 12/23/2000 Document Revised: 05/15/2014 Document Reviewed: 05/15/2014 Elsevier Interactive Patient Education 2016 [employer].  Emergency Department Resource Guide 1) Find a Doctor and Pay Out of Pocket Although you won't have  to find out who is covered by your insurance plan, it is a good idea to ask around and get recommendations. You will then need to call the office and see if the doctor you have chosen will accept you as a new patient and what types of options they offer for patients who are self-pay. Some doctors offer discounts or will set up payment plans for their patients who do not have insurance, but you will need to ask so you aren't surprised when you get to your appointment.  2) Contact Your Local Health Department Not all health departments have doctors that can see patients for sick visits, but many do, so it is worth a call to see if yours does. If you don't know where your local health department is, you can check in your phone book. The CDC also has a tool to help you locate your state's health department, and many state websites also have listings of all of their local health departments.  3) Find a [facility] If your illness is not likely to be very severe or complicated, you may want to try a walk in clinic. These are popping up all over the country in pharmacies, drugstores, and shopping centers. They're usually staffed by nurse practitioners or physician assistants that have been trained to treat common illnesses and complaints. They're usually fairly quick and inexpensive. However, if you have serious medical issues or chronic medical problems, these are probably not your best option.  No Primary Care Doctor: - Call Health Connect at  [phone] - they can help you locate a primary care doctor that  accepts your insurance, provides certain  services, etc. - Physician Referral Service- [phone]  Chronic Pain Problems: Organization         Address  Phone   Notes  [facility]  [phone] Patients need to be referred by their primary care doctor.   Medication Assistance: Organization         Address  Phone   Notes  [facility] Medication [facility] [address] [place], [place] [address] [phone] --Must be a resident of [facility] -- Must have NO insurance coverage whatsoever (no Medicaid/ Medicare, etc.) -- The pt. MUST have a primary care doctor that directs their care regularly and follows them in the community   [facility]  [phone]   [employer]  [phone]    Agencies that provide inexpensive medical care: Organization         Address  Phone   Notes  [place]  [phone]   [facility]    [phone]   [facility] [place], [place] [address] [phone]   [place] [address] [place] [phone]   Planned Parenthood    [phone]   [facility]    [phone]   [facility] [address], [place] Phone:  [phone], Fax:  [phone] Hours of Operation:  9 am - 6 pm, M-F.  Also accepts Medicaid/Medicare and self-pay.  [facility] for [address], [address], [place] Phone: [phone], Fax: [phone]. Hours of Operation:  8:30 am - 5:30 pm, M-F.  Also accepts Medicaid and self-pay.  [facility] [address], [employer] Phone: [phone]   [place], [place]  [place], [place] [phone], Ext. [phone] Mondays & Thursdays: 7-9 AM.  First 15 patients are seen on a first come, first serve basis.    [place] Providers:  Organization         Address  Phone   Notes  [facility] [address], [place] [phone] Also accepts self-pay patients.  [facility] [id number] [place], [place]  [phone]   [place], [address], [place] [phone]   [facility] [address], [place] [phone]   Lucianne Lei [address], [place]   [phone] Only accepts [place] patients after they have their name applied to their card.   Self-Pay (no insurance) in [facility]:  Organization         Address  Phone   Notes  Sickle Cell Patients, [facility] [place] [phone]   [facility] [place] [phone]   [facility] [place]  [place], [place], [place] [phone]   [facility]/Dr. Osei-Bonsu  [address] [place] or [address], [place] [phone] Phone number for both [place] and [place] locations is the same.  [facility] [address], [place] [phone]   [facility] [address], [place] or [address] [phone]   [facility] [address] [place] [phone], phone; [phone], fax Sees patients 1st and 3rd Saturday of every month.  Must not qualify for public or private insurance (i.e. Medicaid, Medicare, [place] Health Choice, Veterans' Benefits)  Household income should be no more than 200% of the poverty level The clinic cannot treat you if you are pregnant or think you are pregnant  Sexually transmitted diseases are not treated at the clinic.    Dental Care: Organization         Address  Phone  Notes  [facility] Department of [facility] [phone] Accepts  children up to age 36 who are enrolled in [place] or [place]; pregnant women with a Medicaid card; and children who have applied for Medicaid or [place] Health Choice, but were declined, whose parents can pay a reduced fee at time of service.  [facility] Department of [facility]  [address], [place] [phone] Accepts children up to age 75 who are enrolled in [place] or [place]; pregnant women with a Medicaid card; and children who have applied for Medicaid or [place] Health Choice, but were declined, whose parents can pay a reduced fee at time of service.  [facility]  [place] [phone] Patients are seen by appointment only. Walk-ins are not accepted. [place] will see patients 22 years of age and older. Monday - Tuesday (8am-5pm) Most Wednesdays (8:30-5pm) $30 per visit, cash only  [facility] Adult [facility] Access PRO[facility]  [address], [facility] [phone] Patients are seen by appointment only. Walk-ins are not accepted. [place] will see patients 28 years of age and older. One Wednesday Evening (Monthly: Volunteer Based).  $30 per visit, cash only  [employer]  (  [phone] for adults; Children under age 41, call Graduate Pediatric Dentistry at [phone]. Children aged 48-14, please call [phone] to request a pediatric application.  Dental services are provided in all areas of dental care including fillings, crowns and bridges, complete and partial dentures, implants, gum treatment, root canals, and extractions. Preventive care is also provided. Treatment is provided to both adults and children. Patients are selected via a lottery and there is often a waiting list.   [facility] [address] [place]  [phone] [email or web address]   [facility] [address] [place], [place] [phone], Ext. [phone] Second and  Fourth Thursday of each month, opens at 6:30 AM; Clinic ends at 9 AM.  Patients are seen on a first-come first-served basis, and a limited number are seen during each clinic.   [facility]  [address] Hillard Danker [place], [place] [phone]   Eligibility Requirements You must have lived in [place], [place], or [place] counties for at least the last three months.   You cannot be eligible for state or federal sponsored [employer], including [employer], [place], or [employer].   You generally cannot be eligible for healthcare insurance through your employer.    How to apply: Eligibility screenings are held every Tuesday and Wednesday afternoon from 1:00 pm until 4:00 pm. You do not need an appointment for the interview!  [facility] [address] [place], [place]   [place]  [place] Department  [place]  [phone]    Behavioral Health Resources in the Community: Intensive Outpatient Programs Organization         Address  Phone  Notes  [place]. [address] [place], [place] [phone]   [facility] [address], [place], [place]   [facility]: [facility] [address] [place], [place]   [place] [address]  [place], [place] or [phone]   Substance Abuse Resources Organization         Address  Phone  Notes  [facility] Services  [phone]   [place]  [phone]   [facility] [place]   Chinita Pester  [phone]   Residential & Outpatient Substance Abuse Program  [phone]   Psychological Services Organization         Address  Phone  Notes  [facility] [place]  [place]  [phone]   [place]  [address] [place] or [phone]    Mobile Crisis Teams Organization         Address  Phone  Notes  [facility], Mobile Crisis Care Unit  [phone]   [facility]  [address] [place], [place]   [place] [address] [place] [phone]    Self-Help/Support Groups Organization         Address  Phone             Notes  [place]. of [place] - variety of support groups  [place] Call for more information  Narcotics Anonymous (NA), [facility] [address], [employer] [place]  2 meetings at this location   Brewing technologist  Notes  [facility] [address],    [place]  [phone]   [facility]  [address], [place] [id number], [place], [place]   [place], [place] [phone] Admissions: 8am-3pm M-F  Incentives Substance [place] [address],    [place], [place] [id number]   [facility] [address] [place], [place], [place]   [facility] [address]  [place], [place]   [facility] - Intensive Outpatient [address] Kristeen [address], [place], [place]   [facility] [place] [address]  [place], [place] [phone] or [phone]   [facility] ([facility]) [address] [place], [place] Accepts Medicaid  [facility] [place] [address],  [place] [phone] Substance Abuse/Addiction Treatment   [facility] Organization         Address  Phone  Notes  [facility]  [phone]   Domenic Schwab, PhD [address] Arlis Porta [place], [place]   [phone] or [phone]   [place], [place] [phone]   [facility] [address] [place], [place] [phone] Insurance/Medicaid/sponsorship through [facility] and Families [address] [place]                                    [place], [place] [phone] [place] [address], [place] [phone]    Dr. Adele Schilder  [phone]   [facility] [place] Dept. 1) [address] [place] 2) [place] 3)  [place] [address], [place] [phone]  [phone]   [place] [phone] or [phone] (After Hours)

## 2014-11-25 NOTE — ED Notes (Signed)
Assisted to restroom and back to bed

## 2015-04-17 ENCOUNTER — Encounter (INDEPENDENT_AMBULATORY_CARE_PROVIDER_SITE_OTHER): Payer: Self-pay

## 2015-05-30 ENCOUNTER — Ambulatory Visit (INDEPENDENT_AMBULATORY_CARE_PROVIDER_SITE_OTHER): Payer: Medicaid Other | Admitting: Obstetrics & Gynecology

## 2015-05-30 ENCOUNTER — Encounter: Payer: Self-pay | Admitting: Obstetrics & Gynecology

## 2015-05-30 VITALS — BP 120/90 | HR 78 | Ht 59.0 in | Wt 206.0 lb

## 2015-05-30 DIAGNOSIS — A499 Bacterial infection, unspecified: Secondary | ICD-10-CM | POA: Diagnosis not present

## 2015-05-30 DIAGNOSIS — B9689 Other specified bacterial agents as the cause of diseases classified elsewhere: Secondary | ICD-10-CM

## 2015-05-30 DIAGNOSIS — N760 Acute vaginitis: Secondary | ICD-10-CM | POA: Diagnosis not present

## 2015-05-30 MED ORDER — METRONIDAZOLE 0.75 % VA GEL: VAGINAL | Status: DC

## 2015-05-30 NOTE — Progress Notes (Signed)
Patient ID: Sandra [name], female   DOB: 26-Feb-1975, 40 y.o.   MRN: [id number]   Chief Complaint  Patient presents with  . Referral    abnormal vaginal bleeding     HPI:    40 y.o. No obstetric history on file. No LMP recorded. Patient has had a hysterectomy.   Location:  Vaginal. Quality:  Mild. Severity:  Mild. Timing:  Recently. Duration:  Few days. Context:  Nonspecific. Modifying factors:   Signs/Symptoms:      Current outpatient prescriptions:  .  cetirizine (ZYRTEC) 10 MG tablet, Take 1 tablet by mouth daily., Disp: , Rfl: 2 .  EQUETRO 300 MG CP12, Take 1 capsule by mouth 2 (two) times daily., Disp: , Rfl: 0 .  FLUoxetine (PROZAC) 10 MG capsule, Take 10 mg by mouth every morning., Disp: , Rfl: 0 .  fluticasone (FLONASE) 50 MCG/ACT nasal spray, Place 2 sprays into both nostrils daily., Disp: 9.9 g, Rfl: 2 .  gabapentin (NEURONTIN) 300 MG capsule, Take 300 mg by mouth 3 (three) times daily., Disp: , Rfl:  .  ibuprofen (ADVIL,MOTRIN) 600 MG tablet, Take 600 mg by mouth 4 (four) times daily as needed for moderate pain. , Disp: , Rfl: 6 .  naproxen (NAPROSYN) 375 MG tablet, Take 1 tablet by mouth 2 (two) times daily as needed., Disp: , Rfl: 0 .  pantoprazole (PROTONIX) 40 MG tablet, Take 1 tablet by mouth daily., Disp: , Rfl: 2 .  pravastatin (PRAVACHOL) 40 MG tablet, Take 40 mg by mouth daily., Disp: , Rfl:  .  metroNIDAZOLE (METROGEL VAGINAL) 0.75 % vaginal gel, Nightly x 5 nights, Disp: 70 g, Rfl: 0  Problem Pertinent ROS:         Extended ROS:        PMFSH:             Past Medical History  Diagnosis Date  . Cervical cancer [facility])     Past Surgical History  Procedure Laterality Date  . Abdominal hysterectomy    . Tonsillectomy    . Adenoidectomy      OB History    No data available      Allergies  Allergen Reactions  . Benazepril Hives  . Metronidazole Nausea And Vomiting  . Codeine Nausea And Vomiting and Rash  . Loratadine Rash  .  Penicillins Rash    Has patient had a PCN reaction causing immediate rash, facial/tongue/throat swelling, SOB or lightheadedness with hypotension: No no Has patient had a PCN reaction causing severe rash involving mucus membranes or skin necrosis: no Has patient had a PCN reaction that required hospitalization No no Has patient had a PCN reaction occurring within the last 10 years: No no If all of the above answers are "NO", then may proceed with Cephalosporin use.     Social History   Social History  . Marital Status: Married    Spouse Name: N/A  . Number of Children: N/A  . Years of Education: N/A   Social History Main Topics  . Smoking status: Current Every Day Smoker -- 0.50 packs/day    Types: Cigarettes  . Smokeless tobacco: None  . Alcohol Use: No  . Drug Use: No  . Sexual Activity: Not Asked   Other Topics Concern  . None   Social History Narrative    Family History  Problem Relation Age of Onset  . Hypertension Mother   . Cancer Mother   . Breast cancer Maternal Aunt   . Lung  cancer Maternal Uncle   . Cancer Other      Examination:  Vitals:  Blood pressure 120/90, pulse 78, height 4\' 11"  (1.499 m), weight 206 lb (93.441 kg).    Physical Examination:     General Appearance:  awake, alert, oriented, in no acute distress  Vulva:  normal appearing vulva with no masses, tenderness or lesions Vagina:  normal mucosa, thin grey discharge Cervix:  absent Uterus:  uterus absent Adnexa: ovaries:present,  normal adnexa in size, nontender and no masses  No results found for this or any previous visit (from the past 72 hour(s)).   DATA orders and reviews:     Prescription Drug Management:   Prescriptions prescribed this encounter:    Meds ordered this encounter  Medications  . metroNIDAZOLE (METROGEL VAGINAL) 0.75 % vaginal gel    Sig: Nightly x 5 nights    Dispense:  70 g    Refill:  0    Pt has a sensitivity to oral metronidazole.  She has a  bacterial vaginosis and needs treatment.  The other option would be cleocin vaginal ovules which are generally more expensive so I am ordering Metro Gel    Renewed Prescriptions:    Current prescription changes:     Impression/Plan(Problem Based):  1. BV (bacterial vaginosis) metrogel     Follow Up:   Return if symptoms worsen or fail to improve.      All questions were answered.

## 2015-06-15 ENCOUNTER — Encounter (HOSPITAL_COMMUNITY): Payer: Self-pay | Admitting: *Deleted

## 2015-06-15 ENCOUNTER — Emergency Department (HOSPITAL_COMMUNITY)
Admission: EM | Admit: 2015-06-15 | Discharge: 2015-06-16 | Disposition: A | Discharge status: Home / Self Care | Payer: Medicaid Other | Attending: Emergency Medicine | Admitting: Emergency Medicine

## 2015-06-15 DIAGNOSIS — Z79899 Other long term (current) drug therapy: Secondary | ICD-10-CM | POA: Insufficient documentation

## 2015-06-15 DIAGNOSIS — R319 Hematuria, unspecified: Secondary | ICD-10-CM | POA: Insufficient documentation

## 2015-06-15 DIAGNOSIS — Z791 Long term (current) use of non-steroidal anti-inflammatories (NSAID): Secondary | ICD-10-CM | POA: Insufficient documentation

## 2015-06-15 DIAGNOSIS — Z7984 Long term (current) use of oral hypoglycemic drugs: Secondary | ICD-10-CM | POA: Diagnosis not present

## 2015-06-15 DIAGNOSIS — R7309 Other abnormal glucose: Secondary | ICD-10-CM | POA: Insufficient documentation

## 2015-06-15 DIAGNOSIS — R1030 Lower abdominal pain, unspecified: Secondary | ICD-10-CM | POA: Diagnosis present

## 2015-06-15 DIAGNOSIS — N939 Abnormal uterine and vaginal bleeding, unspecified: Secondary | ICD-10-CM | POA: Insufficient documentation

## 2015-06-15 DIAGNOSIS — R300 Dysuria: Secondary | ICD-10-CM | POA: Diagnosis not present

## 2015-06-15 DIAGNOSIS — F17210 Nicotine dependence, cigarettes, uncomplicated: Secondary | ICD-10-CM | POA: Insufficient documentation

## 2015-06-15 NOTE — ED Notes (Signed)
Pt c/o intermittent vaginal bleeding "for a while", lower abd pain that started three weeks ago, was seen by obgyn on 05/30/2015 but was not bleeding at that time and did not receive a diagnosis, pt reports that she had sex last night and started bleeding again,

## 2015-06-15 NOTE — ED Provider Notes (Signed)
CSN: [id number]     Arrival date & time 06/15/15  2313 History   First MD Initiated Contact with Patient 06/15/15 2349     Chief Complaint  Patient presents with  . Vaginal Bleeding     (Consider location/radiation/quality/duration/timing/severity/associated sxs/prior Treatment) The history is provided by the patient and medical records. No language interpreter was used.     Sandra [name] is a 40 y.o. female  with a hx of cervical cancer (Tx with total hysterectomy) presents to the Emergency Department complaining of intermittent vaginal bleeding onset "a while ago."  Pt reports this episode began last night after sexual intercourse and occurs only with urination. Associated symptoms include lower abd pain, urinary urgency.  No back or flank pain.  NO hx of kidney stones.  Nothing makes it better.  Pt denies fever, chills, headache, neck pain, chest pain, SOB, constipation, diarrhea, weakness, dizziness, syncope.   Pt reports she was evaluated for this on 05/30/15 by OB/GYN but was not bleeding at the time and was not given a dx.     Past Medical History  Diagnosis Date  . Cervical cancer [facility])    Past Surgical History  Procedure Laterality Date  . Abdominal hysterectomy    . Tonsillectomy    . Adenoidectomy     Family History  Problem Relation Age of Onset  . Hypertension Mother   . Cancer Mother   . Breast cancer Maternal Aunt   . Lung cancer Maternal Uncle   . Cancer Other    Social History  Substance Use Topics  . Smoking status: Current Every Day Smoker -- 0.50 packs/day    Types: Cigarettes  . Smokeless tobacco: None  . Alcohol Use: No   OB History    No data available     Review of Systems  Constitutional: Negative for fever, diaphoresis, appetite change, fatigue and unexpected weight change.  HENT: Negative for mouth sores.   Eyes: Negative for visual disturbance.  Respiratory: Negative for cough, chest tightness, shortness of breath and wheezing.    Cardiovascular: Negative for chest pain.  Gastrointestinal: Negative for nausea, vomiting, abdominal pain, diarrhea and constipation.  Endocrine: Negative for polydipsia, polyphagia and polyuria.  Genitourinary: Positive for dysuria, urgency, hematuria and vaginal bleeding. Negative for frequency.  Musculoskeletal: Negative for back pain and neck stiffness.  Skin: Negative for rash.  Allergic/Immunologic: Negative for immunocompromised state.  Neurological: Negative for syncope, light-headedness and headaches.  Hematological: Does not bruise/bleed easily.  Psychiatric/Behavioral: Negative for sleep disturbance. The patient is not nervous/anxious.       Allergies  Benazepril; Metronidazole; Codeine; Loratadine; and Penicillins  Home Medications   Prior to Admission medications   Medication Sig Start Date End Date Taking? Authorizing Provider  cetirizine (ZYRTEC) 10 MG tablet Take 1 tablet by mouth daily. 10/09/14  Yes Historical Provider, MD  EQUETRO 300 MG CP12 Take 1 capsule by mouth 2 (two) times daily. 10/24/14  Yes Historical Provider, MD  FLUoxetine (PROZAC) 10 MG capsule Take 10 mg by mouth every morning. 10/27/14  Yes Historical Provider, MD  fluticasone (FLONASE) 50 MCG/ACT nasal spray Place 2 sprays into both nostrils daily. 03/28/13  Yes Elisha Headland, NP  gabapentin (NEURONTIN) 300 MG capsule Take 300 mg by mouth 3 (three) times daily.   Yes Historical Provider, MD  ibuprofen (ADVIL,MOTRIN) 600 MG tablet Take 600 mg by mouth 4 (four) times daily as needed for moderate pain.  09/06/14  Yes Historical Provider, MD  pantoprazole (PROTONIX) 40 MG tablet  Take 1 tablet by mouth daily. 10/09/14  Yes Historical Provider, MD  pravastatin (PRAVACHOL) 40 MG tablet Take 40 mg by mouth daily.   Yes Historical Provider, MD  metFORMIN (GLUCOPHAGE) 500 MG tablet Take 1 tablet (500 mg total) by mouth 2 (two) times daily with a meal. 06/16/15   Cameron Schwinn, PA-C  metroNIDAZOLE (METROGEL  VAGINAL) 0.75 % vaginal gel Nightly x 5 nights 05/30/15   Florian Buff, MD  naproxen (NAPROSYN) 375 MG tablet Take 1 tablet by mouth 2 (two) times daily as needed. 10/09/14   Historical Provider, MD   BP 154/84 mmHg  Pulse 74  Temp(Src) 97.7 F (36.5 C) (Oral)  Resp 20  Ht 4\' 11"  (1.499 m)  Wt 99.338 kg  BMI 44.21 kg/m2  SpO2 96% Physical Exam  Constitutional: She appears well-developed and well-nourished. No distress.  HENT:  Head: Normocephalic and atraumatic.  Eyes: Conjunctivae are normal.  Neck: Normal range of motion.  Cardiovascular: Normal rate, regular rhythm, normal heart sounds and intact distal pulses.   No murmur heard. Pulmonary/Chest: Effort normal and breath sounds normal. No respiratory distress. She has no wheezes.  Abdominal: Soft. Bowel sounds are normal. There is tenderness in the suprapubic area. There is no rebound, no guarding and no CVA tenderness. Hernia confirmed negative in the right inguinal area and confirmed negative in the left inguinal area.  Obese abd TTP of the suprapubic region reported without guarding or rebound  Genitourinary: Uterus normal. No labial fusion. There is no rash, tenderness or lesion on the right labia. There is no rash, tenderness or lesion on the left labia. Uterus is not deviated, not enlarged, not fixed and not tender. Cervix exhibits no motion tenderness, no discharge and no friability. Right adnexum displays no mass, no tenderness and no fullness. Left adnexum displays no mass, no tenderness and no fullness. No erythema, tenderness or bleeding in the vagina. No foreign body around the vagina. No signs of injury around the vagina. Vaginal discharge (scant, white, nonodorous) found.  Musculoskeletal: Normal range of motion. She exhibits no edema.  Lymphadenopathy:       Right: No inguinal adenopathy present.       Left: No inguinal adenopathy present.  Neurological: She is alert.  Skin: Skin is warm and dry. She is not diaphoretic.  No erythema.  Psychiatric: She has a normal mood and affect.  Nursing note and vitals reviewed.   ED Course  Procedures (including critical care time) Labs Review Labs Reviewed  WET PREP, GENITAL - Abnormal; Notable for the following:    Clue Cells Wet Prep HPF POC PRESENT (*)    WBC, Wet Prep HPF POC MANY (*)    All other components within normal limits  URINALYSIS, ROUTINE W REFLEX MICROSCOPIC (NOT AT [facility]) - Abnormal; Notable for the following:    Glucose, UA 250 (*)    All other components within normal limits  BASIC METABOLIC PANEL - Abnormal; Notable for the following:    Sodium 134 (*)    Glucose, Bld 243 (*)    All other components within normal limits  PREGNANCY, URINE  CBC WITH DIFFERENTIAL/PLATELET  RPR  HIV ANTIBODY (ROUTINE TESTING)  CBG MONITORING, ED  GC/CHLAMYDIA PROBE AMP ([place]) NOT AT [facility]     MDM   Final diagnoses:  Elevated glucose level  Vaginal bleeding  Suprapubic abdominal pain, unspecified laterality   Sandra [name] presents with c/o vaginal bleeding and hematuria.  Pelvic exam without blood noted.  No  open lesions, tumors, lacerations or areas of friability.  UA without hematuria or evidence of UTI.  No anemia on labwork.  Unknown source of bleeding at this time.  Mild suprapubic abd pain on exam without surgical abd.  Pt with hx of total hysterectomy.    Elevated glucose noted without anion gap.  Repeat CBG is 191 and this is not fasting.  Will hold on metformin at this time.  Discussed with patient importance of close glucose monitoring and follow-up with PCP.    Unknown etiology of vaginal bleeding.  No emergency conditions found on today's visit.  Recommend close follow-up with OB/GYN for this ongoing issue.    Jarrett Soho Temekia Caskey, PA-C 06/16/15 [place], MD 06/16/15 [phone]

## 2015-06-16 LAB — BASIC METABOLIC PANEL
ANION GAP: 7 (ref 5–15)
BUN: 10 mg/dL (ref 6–20)
CALCIUM: 9.1 mg/dL (ref 8.9–10.3)
CO2: 26 mmol/L (ref 22–32)
CREATININE: 0.83 mg/dL (ref 0.44–1.00)
Chloride: 101 mmol/L (ref 101–111)
GFR calc Af Amer: >60 mL/min (ref >60)
GFR calc non Af Amer: >60 mL/min (ref >60)
GLUCOSE: 243 mg/dL — AB (ref 65–99)
Potassium: 4.4 mmol/L (ref 3.5–5.1)
Sodium: 134 mmol/L — ABNORMAL LOW (ref 135–145)

## 2015-06-16 LAB — CBC WITH DIFFERENTIAL/PLATELET
Basophils Absolute: 0 10*3/uL (ref 0.0–0.1)
Basophils Relative: 0 %
Eosinophils Absolute: 0.1 10*3/uL (ref 0.0–0.7)
Eosinophils Relative: 1 %
HEMATOCRIT: 41.5 % (ref 36.0–46.0)
Hemoglobin: 13.1 g/dL (ref 12.0–15.0)
LYMPHS ABS: 3.5 10*3/uL (ref 0.7–4.0)
LYMPHS PCT: 37 %
MCH: 26.5 pg (ref 26.0–34.0)
MCHC: 31.6 g/dL (ref 30.0–36.0)
MCV: 84 fL (ref 78.0–100.0)
MONO ABS: 0.8 10*3/uL (ref 0.1–1.0)
MONOS PCT: 9 %
NEUTROS ABS: 5 10*3/uL (ref 1.7–7.7)
Neutrophils Relative %: 53 %
Platelets: 313 10*3/uL (ref 150–400)
RBC: 4.94 MIL/uL (ref 3.87–5.11)
RDW: 14.1 % (ref 11.5–15.5)
WBC: 9.5 10*3/uL (ref 4.0–10.5)

## 2015-06-16 LAB — WET PREP, GENITAL
Sperm: NONE SEEN
Trich, Wet Prep: NONE SEEN
Yeast Wet Prep HPF POC: NONE SEEN

## 2015-06-16 LAB — URINALYSIS, ROUTINE W REFLEX MICROSCOPIC
BILIRUBIN URINE: NEGATIVE
Glucose, UA: 250 mg/dL — AB
Hgb urine dipstick: NEGATIVE
Ketones, ur: NEGATIVE mg/dL
Leukocytes, UA: NEGATIVE
NITRITE: NEGATIVE
PH: 5.5 (ref 5.0–8.0)
Protein, ur: NEGATIVE mg/dL
SPECIFIC GRAVITY, URINE: 1.015 (ref 1.005–1.030)

## 2015-06-16 LAB — CBG MONITORING, ED: GLUCOSE-CAPILLARY: 191 mg/dL — AB (ref 65–99)

## 2015-06-16 LAB — PREGNANCY, URINE: Preg Test, Ur: NEGATIVE

## 2015-06-16 MED ORDER — OXYCODONE-ACETAMINOPHEN 5-325 MG PO TABS
2.0000 | ORAL_TABLET | Freq: Once | ORAL | Status: AC
Start: 2015-06-16 — End: 2015-06-16
  Administered 2015-06-16: 2 via ORAL
  Filled 2015-06-16: qty 2

## 2015-06-16 MED ORDER — METFORMIN HCL 500 MG PO TABS: 500.0000 mg | ORAL_TABLET | Freq: Two times a day (BID) | ORAL | Status: DC

## 2015-06-16 NOTE — Discharge Instructions (Signed)
1. Medications: usual home medications 2. Treatment: rest, drink plenty of fluids,  3. Follow Up: Please followup with your primary doctor and OB/GYN in 3-5 days for discussion of your diagnoses and further evaluation after today's visit; if you do not have a primary care doctor use the resource guide provided to find one; Please return to the ER for worsening symptoms, high fevers, vomiting or other concerns

## 2015-06-17 LAB — HIV ANTIBODY (ROUTINE TESTING W REFLEX): HIV SCREEN 4TH GENERATION: NONREACTIVE

## 2015-06-17 LAB — RPR: RPR: NONREACTIVE

## 2015-06-17 LAB — GC/CHLAMYDIA PROBE AMP (~~LOC~~) NOT AT ARMC
Chlamydia: NEGATIVE
Neisseria Gonorrhea: NEGATIVE

## 2015-08-12 ENCOUNTER — Emergency Department (HOSPITAL_COMMUNITY)
Admission: EM | Admit: 2015-08-12 | Discharge: 2015-08-12 | Disposition: A | Discharge status: Home / Self Care | Payer: Medicaid Other | Attending: Emergency Medicine | Admitting: Emergency Medicine

## 2015-08-12 ENCOUNTER — Encounter (HOSPITAL_COMMUNITY): Payer: Self-pay | Admitting: Emergency Medicine

## 2015-08-12 ENCOUNTER — Emergency Department (HOSPITAL_COMMUNITY): Payer: Medicaid Other

## 2015-08-12 DIAGNOSIS — Z8541 Personal history of malignant neoplasm of cervix uteri: Secondary | ICD-10-CM | POA: Insufficient documentation

## 2015-08-12 DIAGNOSIS — M25511 Pain in right shoulder: Secondary | ICD-10-CM | POA: Diagnosis not present

## 2015-08-12 DIAGNOSIS — F17210 Nicotine dependence, cigarettes, uncomplicated: Secondary | ICD-10-CM | POA: Insufficient documentation

## 2015-08-12 MED ORDER — TRAMADOL HCL 50 MG PO TABS: 50.0000 mg | ORAL_TABLET | Freq: Four times a day (QID) | ORAL | 0 refills | Status: DC | PRN

## 2015-08-12 MED ORDER — DICLOFENAC SODIUM 75 MG PO TBEC: 75.0000 mg | DELAYED_RELEASE_TABLET | Freq: Two times a day (BID) | ORAL | 0 refills | Status: DC

## 2015-08-12 NOTE — ED Provider Notes (Signed)
[facility] DEPT Provider Note   CSN: [id number] Arrival date & time: 08/12/15  1744  By signing my name below, I, Gwenlyn Fudge, attest that this documentation has been prepared under the direction and in the presence of Lamont, PA. Electronically Signed: Gwenlyn Fudge, ED Scribe. 08/12/15. 7:12 PM.   First MD Initiated Contact with Patient 08/12/15 1810    History   Chief Complaint Chief Complaint  Patient presents with  . Shoulder Pain   The history is provided by the patient. No language interpreter was used.    HPI Comments: Sandra [name] is a 40 y.o. female who presents to the Emergency Department complaining of gradual onset, constant right upper arm and shoulder pain onset 5 days. Pt states 5 days ago she went to sleep with no pain and states pain was present when she awoke in the morning. Pt states she folded 3 loads of clothes last night and was unable to sleep last night which exacerbated her pain. Pt states when she got up this morning she had trouble lifting her right arm as well. Pt denies any injury to arm and heavy lifting will cause pain. Pt used Bengay and Aspercreme with no relief to pain. She states pain is exacerbated with movement of the right arm outward, forward, and backward. She has required assistance to perform tasks like getting dressed and combing hair. She denies swelling, redness of the joint, neck or chest pain.  Past Medical History:  Diagnosis Date  . Cervical cancer [facility])     Patient Active Problem List   Diagnosis Date Noted  . CARPAL TUNNEL SYNDROME 10/02/2009    Past Surgical History:  Procedure Laterality Date  . ABDOMINAL HYSTERECTOMY    . ADENOIDECTOMY    . TONSILLECTOMY      OB History    Gravida Para Term Preterm AB Living   2         2   SAB TAB Ectopic Multiple Live Births                   Home Medications    Prior to Admission medications   Medication Sig Start Date End Date Taking? Authorizing Provider    cetirizine (ZYRTEC) 10 MG tablet Take 1 tablet by mouth daily. 10/09/14   Historical Provider, MD  EQUETRO 300 MG CP12 Take 1 capsule by mouth 2 (two) times daily. 10/24/14   Historical Provider, MD  FLUoxetine (PROZAC) 10 MG capsule Take 10 mg by mouth every morning. 10/27/14   Historical Provider, MD  fluticasone (FLONASE) 50 MCG/ACT nasal spray Place 2 sprays into both nostrils daily. 03/28/13   Elisha Headland, FNP  gabapentin (NEURONTIN) 300 MG capsule Take 300 mg by mouth 3 (three) times daily.    Historical Provider, MD  ibuprofen (ADVIL,MOTRIN) 600 MG tablet Take 600 mg by mouth 4 (four) times daily as needed for moderate pain.  09/06/14   Historical Provider, MD  metroNIDAZOLE (METROGEL VAGINAL) 0.75 % vaginal gel Nightly x 5 nights 05/30/15   Florian Buff, MD  naproxen (NAPROSYN) 375 MG tablet Take 1 tablet by mouth 2 (two) times daily as needed. 10/09/14   Historical Provider, MD  pantoprazole (PROTONIX) 40 MG tablet Take 1 tablet by mouth daily. 10/09/14   Historical Provider, MD  pravastatin (PRAVACHOL) 40 MG tablet Take 40 mg by mouth daily.    Historical Provider, MD    Family History Family History  Problem Relation Age of Onset  . Hypertension Mother   .  Cancer Mother   . Breast cancer Maternal Aunt   . Lung cancer Maternal Uncle   . Cancer Other     Social History Social History  Substance Use Topics  . Smoking status: Current Every Day Smoker    Packs/day: 0.50    Types: Cigarettes  . Smokeless tobacco: Never Used  . Alcohol use No     Allergies   Benazepril; Metronidazole; Codeine; Loratadine; and Penicillins   Review of Systems Review of Systems  Constitutional: Negative for fever.  Musculoskeletal: Positive for arthralgias.  Neurological: Positive for weakness (in right arm).     Physical Exam Updated Vital Signs BP 136/75 (BP Location: Left Arm)   Pulse 70   Temp 97.7 F (36.5 C) (Oral)   Resp 16   Ht 4\' 11"  (1.499 m)   Wt 206 lb (93.4 kg)   SpO2  100%   BMI 41.61 kg/m   Physical Exam  Constitutional: She appears well-developed and well-nourished.  HENT:  Head: Normocephalic.  Eyes: Conjunctivae are normal.  Cardiovascular: Normal rate, regular rhythm, normal heart sounds and intact distal pulses.   Pulmonary/Chest: Effort normal and breath sounds normal. No respiratory distress. She has no wheezes. She has no rales.  Abdominal: She exhibits no distension.  Musculoskeletal: Normal range of motion.  Localized tenderness to right AC joint to the right deltoid muscle Ne erythema or edema Grip strength are strong and equal 5'5 strength against resistance of BUE's  Neurological: She is alert.  Skin: Skin is warm and dry.  Psychiatric: She has a normal mood and affect. Her behavior is normal.  Nursing note and vitals reviewed.    ED Treatments / Results  DIAGNOSTIC STUDIES: Oxygen Saturation is 100% on RA, normal by my interpretation.    COORDINATION OF CARE: 6:15 PM Discussed treatment plan with pt at bedside which includes DG Shoulder Right and Anti-inflammatory medication and pt agreed to plan.  Labs (all labs ordered are listed, but only abnormal results are displayed) Labs Reviewed - No data to display  EKG  EKG Interpretation None       Radiology Dg Shoulder Right  Result Date: 08/12/2015 CLINICAL DATA:  Right shoulder pain for 6 days. No known injury. Initial encounter. EXAM: RIGHT SHOULDER - 2+ VIEW COMPARISON:  None. FINDINGS: There is no evidence of fracture or dislocation. There is no evidence of arthropathy or other focal bone abnormality. Soft tissues are unremarkable. IMPRESSION: Negative exam. Electronically Signed   By: Inge Rise M.D.   On: 08/12/2015 19:03    Procedures Procedures (including critical care time)  Medications Ordered in ED Medications - No data to display   Initial Impression / Assessment and Plan / ED Course  I have reviewed the triage vital signs and the nursing  notes.  Pertinent labs & imaging results that were available during my care of the patient were reviewed by me and considered in my medical decision making (see chart for details).  Clinical Course   Pt with reproducible right shoulder pain.  No concerning sx' s for septic joint.  NV intact.  XR neg.  Pt agrees to symptomatic tx and orthopedic f/u if needed.   Final Clinical Impressions(s) / ED Diagnoses   Final diagnoses:  Shoulder pain, right    New Prescriptions New Prescriptions   No medications on file   I personally performed the services described in this documentation, which was scribed in my presence. The recorded information has been reviewed and is accurate.  Chelsye Frew, PA-C 08/14/15 [place], DO 08/15/15 1630

## 2015-08-12 NOTE — ED Triage Notes (Signed)
PT c/o right shoulder pain on ROM and muscle spasms x5 days but denies any injury to shoulder.

## 2015-08-12 NOTE — Discharge Instructions (Signed)
Apply ice packs on/off to your shoulder.  Call the orthopedic doctor listed to arrange a follow-up appt in one week if not improving °

## 2015-08-27 ENCOUNTER — Ambulatory Visit (INDEPENDENT_AMBULATORY_CARE_PROVIDER_SITE_OTHER): Payer: Medicaid Other | Admitting: Orthopaedic Surgery

## 2015-08-27 ENCOUNTER — Encounter: Payer: Self-pay | Admitting: Orthopaedic Surgery

## 2015-08-27 VITALS — BP 130/88 | HR 67 | Temp 97.7°F | Resp 18 | Ht 60.0 in | Wt 199.0 lb

## 2015-08-27 DIAGNOSIS — M25511 Pain in right shoulder: Secondary | ICD-10-CM | POA: Diagnosis not present

## 2015-08-27 MED ORDER — DICLOFENAC SODIUM 75 MG PO TBEC: 75.0000 mg | DELAYED_RELEASE_TABLET | Freq: Two times a day (BID) | ORAL | 2 refills | Status: DC

## 2015-08-27 NOTE — Patient Instructions (Signed)
Shoulder Range of Motion Exercises Shoulder range of motion (ROM) exercises are designed to keep the shoulder moving freely. They are often recommended for people who have shoulder pain. MOVEMENT EXERCISE When you are able, do this exercise 5-6 days per week, or as told by your health care provider. Work toward doing 2 sets of 10 swings. Pendulum Exercise How To Do This Exercise Lying Down 1. Lie face-down on a bed with your abdomen close to the side of the bed. 2. Let your arm hang over the side of the bed. 3. Relax your shoulder, arm, and hand. 4. Slowly and gently swing your arm forward and back. Do not use your neck muscles to swing your arm. They should be relaxed. If you are struggling to swing your arm, have someone gently swing it for you. When you do this exercise for the first time, swing your arm at a 15 degree angle for 15 seconds, or swing your arm 10 times. As pain lessens over time, increase the angle of the swing to 30-45 degrees. 5. Repeat steps 1-4 with the other arm. How To Do This Exercise While Standing 1. Stand next to a sturdy chair or table and hold on to it with your hand.  Bend forward at the waist.  Bend your knees slightly.  Relax your other arm and let it hang limp.  Relax the shoulder blade of the arm that is hanging and let it drop.  While keeping your shoulder relaxed, use body motion to swing your arm in small circles. The first time you do this exercise, swing your arm for about 30 seconds or 10 times. When you do it next time, swing your arm for a little longer.  Stand up tall and relax.  Repeat steps 1-7, this time changing the direction of the circles. 2. Repeat steps 1-8 with the other arm. STRETCHING EXERCISES Do these exercises 3-4 times per day on 5-6 days per week or as told by your health care provider. Work toward holding the stretch for 20 seconds. Stretching Exercise 1 1. Lift your arm straight out in front of you. 2. Bend your arm 90  degrees at the elbow (right angle) so your forearm goes across your body and looks like the letter "L." 3. Use your other arm to gently pull the elbow forward and across your body. 4. Repeat steps 1-3 with the other arm. Stretching Exercise 2 You will need a towel or rope for this exercise. 1. Bend one arm behind your back with the palm facing outward. 2. Hold a towel with your other hand. 3. Reach the arm that holds the towel above your head, and bend that arm at the elbow. Your wrist should be behind your neck. 4. Use your free hand to grab the free end of the towel. 5. With the higher hand, gently pull the towel up behind you. 6. With the lower hand, pull the towel down behind you. 7. Repeat steps 1-6 with the other arm. STRENGTHENING EXERCISES Do each of these exercises at four different times of day (sessions) every day or as told by your health care provider. To begin with, repeat each exercise 5 times (repetitions). Work toward doing 3 sets of 12 repetitions or as told by your health care provider. Strengthening Exercise 1 You will need a light weight for this activity. As you grow stronger, you may use a heavier weight. 1. Standing with a weight in your hand, lift your arm straight out to the side   until it is at the same height as your shoulder. 2. Bend your arm at 90 degrees so that your fingers are pointing to the ceiling. 3. Slowly raise your hand until your arm is straight up in the air. 4. Repeat steps 1-3 with the other arm. Strengthening Exercise 2 You will need a light weight for this activity. As you grow stronger, you may use a heavier weight. 1. Standing with a weight in your hand, gradually move your straight arm in an arc, starting at your side, then out in front of you, then straight up over your head. 2. Gradually move your other arm in an arc, starting at your side, then out in front of you, then straight up over your head. 3. Repeat steps 1-2 with the other  arm. Strengthening Exercise 3 You will need an elastic band for this activity. As you grow stronger, gradually increase the size of the bands or increase the number of bands that you use at one time. 1. While standing, hold an elastic band in one hand and raise that arm up in the air. 2. With your other hand, pull down the band until that hand is by your side. 3. Repeat steps 1-2 with the other arm.   This information is not intended to replace advice given to you by your health care provider. Make sure you discuss any questions you have with your health care provider.   Document Released: 09/27/2002 Document Revised: 05/15/2014 Document Reviewed: 12/25/2013 Elsevier Interactive Patient Education 2016 Elsevier Inc.  

## 2015-08-27 NOTE — Progress Notes (Signed)
Subjective: My right shoulder hurts    Patient ID: Sandra [name], female    DOB: October 14, 1975, 40 y.o.   MRN: [id number]  HPI She has a three to four week history of right shoulder pain.  She has no trauma.  She was seen in the ER 7-31 for this and had x-rays done.  X-rays were negative. She was given diclofenac in the ER which helped.  She has run out of the medicine.  She was seen at [place] last week and referred her for continued pain of the right shoulder.  She has no redness, no paresthesias. She has decreased motion.   She uses ice which helps.  She has tried Advil and Aleve which did not help.  She has pain sleeping.   Review of Systems  HENT: Negative for congestion.   Respiratory: Negative for cough and shortness of breath.   Cardiovascular: Negative for chest pain and leg swelling.  Endocrine: Positive for cold intolerance.  Musculoskeletal: Positive for arthralgias.  Allergic/Immunologic: Positive for environmental allergies.   Past Medical History:  Diagnosis Date  . Cervical cancer [facility])     Past Surgical History:  Procedure Laterality Date  . ABDOMINAL HYSTERECTOMY    . ADENOIDECTOMY    . TONSILLECTOMY      Current Outpatient Prescriptions on File Prior to Visit  Medication Sig Dispense Refill  . cetirizine (ZYRTEC) 10 MG tablet Take 1 tablet by mouth daily.  2  . EQUETRO 300 MG CP12 Take 1 capsule by mouth 2 (two) times daily.  0  . FLUoxetine (PROZAC) 10 MG capsule Take 10 mg by mouth every morning.  0  . fluticasone (FLONASE) 50 MCG/ACT nasal spray Place 2 sprays into both nostrils daily. 9.9 g 2  . gabapentin (NEURONTIN) 300 MG capsule Take 300 mg by mouth 3 (three) times daily.    . metroNIDAZOLE (METROGEL VAGINAL) 0.75 % vaginal gel Nightly x 5 nights 70 g 0  . pantoprazole (PROTONIX) 40 MG tablet Take 1 tablet by mouth daily.  2  . pravastatin (PRAVACHOL) 40 MG tablet Take 40 mg by mouth daily.    . traMADol (ULTRAM) 50 MG tablet Take 1  tablet (50 mg total) by mouth every 6 (six) hours as needed. 10 tablet 0   No current facility-administered medications on file prior to visit.     Social History   Social History  . Marital status: Married    Spouse name: N/A  . Number of children: N/A  . Years of education: N/A   Occupational History  . Not on file.   Social History Main Topics  . Smoking status: Current Every Day Smoker    Packs/day: 0.50    Types: Cigarettes  . Smokeless tobacco: Never Used  . Alcohol use No  . Drug use: No  . Sexual activity: Not on file   Other Topics Concern  . Not on file   Social History Narrative  . No narrative on file    Family History  Problem Relation Age of Onset  . Hypertension Mother   . Cancer Mother   . Breast cancer Maternal Aunt   . Lung cancer Maternal Uncle   . Cancer Other     BP 130/88   Pulse 67   Temp 97.7 F (36.5 C)   Resp 18   Ht 5' (1.524 m)   Wt 199 lb (90.3 kg)   BMI 38.86 kg/m      Objective:   Physical  Exam  Constitutional: She is oriented to person, place, and time. She appears well-developed and well-nourished.  HENT:  Head: Normocephalic and atraumatic.  Eyes: Conjunctivae and EOM are normal. Pupils are equal, round, and reactive to light.  Neck: Normal range of motion. Neck supple.  Cardiovascular: Normal rate, regular rhythm and intact distal pulses.   Pulmonary/Chest: Effort normal.  Abdominal: Soft.  Musculoskeletal: She exhibits tenderness (Pain left shoulder, abduction 90, forward 120, internal 30, external 30, extension 15, adduction 40, NV intact.  ROM full.  Left shoulder negative.).  Neurological: She is alert and oriented to person, place, and time. She displays normal reflexes. No cranial nerve deficit. She exhibits normal muscle tone. Coordination normal.  Skin: Skin is warm and dry.  Psychiatric: She has a normal mood and affect. Her behavior is normal. Judgment and thought content normal.   She smokes and was told  to try to stop or cut back.  She will consider.  X-rays from the hospital were reviewed.     Assessment & Plan:   Encounter Diagnosis  Name Primary?  . Right shoulder pain Yes   PROCEDURE NOTE:  The patient request injection, verbal consent was obtained.  The right shoulder was prepped appropriately after time out was performed.   Sterile technique was observed and injection of 1 cc of Depo-Medrol 40 mg with several cc's of plain xylocaine. Anesthesia was provided by ethyl chloride and a 20-gauge needle was used to inject the shoulder area. A posterior approach was used.  The injection was tolerated well.  A band aid dressing was applied.  The patient was advised to apply ice later today and tomorrow to the injection sight as needed.  A print out of exercises given.  Take diclofenac twice a day, not with Advil or Aleve.  Return in two weeks.  Call if any problem.  Precautions discussed.  Electronically Signed Sanjuana Kava, MD 8/15/20173:14 PM

## 2015-09-10 ENCOUNTER — Ambulatory Visit: Payer: Medicaid Other | Admitting: Orthopaedic Surgery

## 2015-09-11 ENCOUNTER — Ambulatory Visit (INDEPENDENT_AMBULATORY_CARE_PROVIDER_SITE_OTHER): Payer: Medicaid Other | Admitting: Orthopaedic Surgery

## 2015-09-11 ENCOUNTER — Encounter: Payer: Self-pay | Admitting: Orthopaedic Surgery

## 2015-09-11 VITALS — BP 156/90 | HR 83 | Temp 97.7°F | Ht 61.0 in | Wt 200.0 lb

## 2015-09-11 DIAGNOSIS — M25511 Pain in right shoulder: Secondary | ICD-10-CM | POA: Diagnosis not present

## 2015-09-11 DIAGNOSIS — Z720 Tobacco use: Secondary | ICD-10-CM

## 2015-09-11 DIAGNOSIS — F17200 Nicotine dependence, unspecified, uncomplicated: Secondary | ICD-10-CM

## 2015-09-11 NOTE — Patient Instructions (Signed)
Smoking Cessation, Tips for Success If you are ready to quit smoking, congratulations! You have chosen to help yourself be healthier. Cigarettes bring nicotine, tar, carbon monoxide, and other irritants into your body. Your lungs, heart, and blood vessels will be able to work better without these poisons. There are many different ways to quit smoking. Nicotine gum, nicotine patches, a nicotine inhaler, or nicotine nasal spray can help with physical craving. Hypnosis, support groups, and medicines help break the habit of smoking. WHAT THINGS CAN I DO TO MAKE QUITTING EASIER?  Here are some tips to help you quit for good:  Pick a date when you will quit smoking completely. Tell all of your friends and family about your plan to quit on that date.  Do not try to slowly cut down on the number of cigarettes you are smoking. Pick a quit date and quit smoking completely starting on that day.  Throw away all cigarettes.   Clean and remove all ashtrays from your home, work, and car.  On a card, write down your reasons for quitting. Carry the card with you and read it when you get the urge to smoke.  Cleanse your body of nicotine. Drink enough water and fluids to keep your urine clear or pale yellow. Do this after quitting to flush the nicotine from your body.  Learn to predict your moods. Do not let a bad situation be your excuse to have a cigarette. Some situations in your life might tempt you into wanting a cigarette.  Never have "just one" cigarette. It leads to wanting another and another. Remind yourself of your decision to quit.  Change habits associated with smoking. If you smoked while driving or when feeling stressed, try other activities to replace smoking. Stand up when drinking your coffee. Brush your teeth after eating. Sit in a different chair when you read the paper. Avoid alcohol while trying to quit, and try to drink fewer caffeinated beverages. Alcohol and caffeine may urge you to  smoke.  Avoid foods and drinks that can trigger a desire to smoke, such as sugary or spicy foods and alcohol.  Ask people who smoke not to smoke around you.  Have something planned to do right after eating or having a cup of coffee. For example, plan to take a walk or exercise.  Try a relaxation exercise to calm you down and decrease your stress. Remember, you may be tense and nervous for the first 2 weeks after you quit, but this will pass.  Find new activities to keep your hands busy. Play with a pen, coin, or rubber band. Doodle or draw things on paper.  Brush your teeth right after eating. This will help cut down on the craving for the taste of tobacco after meals. You can also try mouthwash.   Use oral substitutes in place of cigarettes. Try using lemon drops, carrots, cinnamon sticks, or chewing gum. Keep them handy so they are available when you have the urge to smoke.  When you have the urge to smoke, try deep breathing.  Designate your home as a nonsmoking area.  If you are a heavy smoker, ask your health care provider about a prescription for nicotine chewing gum. It can ease your withdrawal from nicotine.  Reward yourself. Set aside the cigarette money you save and buy yourself something nice.  Look for support from others. Join a support group or smoking cessation program. Ask someone at home or at work to help you with your plan   to quit smoking.  Always ask yourself, "Do I need this cigarette or is this just a reflex?" Tell yourself, "Today, I choose not to smoke," or "I do not want to smoke." You are reminding yourself of your decision to quit.  Do not replace cigarette smoking with electronic cigarettes (commonly called e-cigarettes). The safety of e-cigarettes is unknown, and some may contain harmful chemicals.  If you relapse, do not give up! Plan ahead and think about what you will do the next time you get the urge to smoke. HOW WILL I FEEL WHEN I QUIT SMOKING? You  may have symptoms of withdrawal because your body is used to nicotine (the addictive substance in cigarettes). You may crave cigarettes, be irritable, feel very hungry, cough often, get headaches, or have difficulty concentrating. The withdrawal symptoms are only temporary. They are strongest when you first quit but will go away within 10-14 days. When withdrawal symptoms occur, stay in control. Think about your reasons for quitting. Remind yourself that these are signs that your body is healing and getting used to being without cigarettes. Remember that withdrawal symptoms are easier to treat than the major diseases that smoking can cause.  Even after the withdrawal is over, expect periodic urges to smoke. However, these cravings are generally short lived and will go away whether you smoke or not. Do not smoke! WHAT RESOURCES ARE AVAILABLE TO HELP ME QUIT SMOKING? Your health care provider can direct you to community resources or hospitals for support, which may include:  Group support.  Education.  Hypnosis.  Therapy.   This information is not intended to replace advice given to you by your health care provider. Make sure you discuss any questions you have with your health care provider.   Document Released: 09/27/2003 Document Revised: 01/19/2014 Document Reviewed: 06/16/2012 Elsevier Interactive Patient Education 2016 Elsevier Inc.  

## 2015-09-11 NOTE — Progress Notes (Signed)
CC:  My shoulder is hurting again.  She had good results from the injection last time.  She has no paresthesias or new trauma.  NV intact.  ROM is full but tender in the extremes.  She is smoking.  I have talked to her about cutting back or stopping.  She is willing to do this.  Encounter Diagnoses  Name Primary?  . Right shoulder pain Yes  . Tobacco smoker within last 12 months    PROCEDURE NOTE:  The patient request injection, verbal consent was obtained.  The right shoulder was prepped appropriately after time out was performed.   Sterile technique was observed and injection of 1 cc of Depo-Medrol 40 mg with several cc's of plain xylocaine. Anesthesia was provided by ethyl chloride and a 20-gauge needle was used to inject the shoulder area. A posterior approach was used.  The injection was tolerated well.  A band aid dressing was applied.  The patient was advised to apply ice later today and tomorrow to the injection sight as needed.  Return in three weeks.  She may need MRI if pain continues.  Call if any problem.  Precautions discussed.  Electronically Signed Sanjuana Kava, MD 8/30/20179:56 AM

## 2015-09-20 ENCOUNTER — Other Ambulatory Visit (HOSPITAL_COMMUNITY)
Admission: RE | Admit: 2015-09-20 | Discharge: 2015-09-20 | Disposition: A | Discharge status: Home / Self Care | Payer: Medicaid Other | Source: Other Acute Inpatient Hospital | Attending: Urology | Admitting: Urology

## 2015-09-20 ENCOUNTER — Ambulatory Visit (INDEPENDENT_AMBULATORY_CARE_PROVIDER_SITE_OTHER): Payer: Medicaid Other | Admitting: Urology

## 2015-09-20 DIAGNOSIS — C674 Malignant neoplasm of posterior wall of bladder: Secondary | ICD-10-CM

## 2015-09-20 DIAGNOSIS — C670 Malignant neoplasm of trigone of bladder: Secondary | ICD-10-CM

## 2015-09-20 DIAGNOSIS — R310 Gross hematuria: Secondary | ICD-10-CM

## 2015-09-20 LAB — URINE MICROSCOPIC-ADD ON

## 2015-09-20 LAB — URINALYSIS, ROUTINE W REFLEX MICROSCOPIC
Bilirubin Urine: NEGATIVE
Glucose, UA: NEGATIVE mg/dL
Ketones, ur: NEGATIVE mg/dL
LEUKOCYTES UA: NEGATIVE
NITRITE: NEGATIVE
Specific Gravity, Urine: 1.025 (ref 1.005–1.030)
pH: 6 (ref 5.0–8.0)

## 2015-09-22 LAB — URINE CULTURE: Culture: 1000 — AB

## 2015-09-24 ENCOUNTER — Other Ambulatory Visit: Payer: Self-pay | Admitting: Urology

## 2015-09-25 ENCOUNTER — Other Ambulatory Visit: Payer: Self-pay | Admitting: Urology

## 2015-09-25 DIAGNOSIS — C674 Malignant neoplasm of posterior wall of bladder: Secondary | ICD-10-CM

## 2015-09-25 DIAGNOSIS — C670 Malignant neoplasm of trigone of bladder: Secondary | ICD-10-CM

## 2015-09-25 DIAGNOSIS — R310 Gross hematuria: Secondary | ICD-10-CM

## 2015-09-26 ENCOUNTER — Ambulatory Visit (HOSPITAL_COMMUNITY)
Admission: RE | Admit: 2015-09-26 | Discharge: 2015-09-26 | Disposition: A | Discharge status: Home / Self Care | Payer: Medicaid Other | Source: Ambulatory Visit | Attending: Urology | Admitting: Urology

## 2015-09-26 DIAGNOSIS — I700 Atherosclerosis of aorta: Secondary | ICD-10-CM | POA: Diagnosis not present

## 2015-09-26 DIAGNOSIS — C674 Malignant neoplasm of posterior wall of bladder: Secondary | ICD-10-CM

## 2015-09-26 DIAGNOSIS — R310 Gross hematuria: Secondary | ICD-10-CM

## 2015-09-26 DIAGNOSIS — Z90710 Acquired absence of both cervix and uterus: Secondary | ICD-10-CM | POA: Diagnosis not present

## 2015-09-26 DIAGNOSIS — K760 Fatty (change of) liver, not elsewhere classified: Secondary | ICD-10-CM | POA: Diagnosis not present

## 2015-09-26 DIAGNOSIS — C670 Malignant neoplasm of trigone of bladder: Secondary | ICD-10-CM | POA: Diagnosis not present

## 2015-09-26 MED ORDER — IOPAMIDOL (ISOVUE-300) INJECTION 61%
125.0000 mL | Freq: Once | INTRAVENOUS | Status: AC | PRN
  Administered 2015-09-26: 100 mL via INTRAVENOUS

## 2015-09-30 ENCOUNTER — Encounter (HOSPITAL_BASED_OUTPATIENT_CLINIC_OR_DEPARTMENT_OTHER): Payer: Self-pay | Admitting: *Deleted

## 2015-09-30 NOTE — Progress Notes (Signed)
NPO AFTER MN.  ARRIVE AT 0800.  NEEDS ISTAT.  CURRENT EKG IN CHART AND EPIC.  WILL TAKE AM MEDS W/ EXCEPTION NO METFORMIN, DOS W/ SIPS OF WATER.   PT STATED THAT SHE IS HAVING GROSS HEMATURIA WITH CLOTS W/ DIZZINESS, ADVISED PT TO CALL OFFICE FOR DR WRENN BECAUSE HER HEMOGLOBIN MAYBE DOWN SINCE SHE IS SYMPTOMATIC, PT VERBALIZED UNDERSTANDING.

## 2015-10-02 ENCOUNTER — Ambulatory Visit: Payer: Medicaid Other | Admitting: Orthopaedic Surgery

## 2015-10-02 NOTE — H&P (Signed)
CC: I have blood in my urine.  HPI: Sandra [name] is a 40 year-old female patient who is here for blood in the urine.  She did see the blood in her urine. She first noticed the symptoms approximately 03/13/2015. She has seen blood clots.   She does not have a burning sensation when she urinates. She is not currently having trouble urinating.   Sandra [name] is a 40 yo WF with a 5-6 month history of intermittent hematuria with clots. She bled most of the week with clots but it has stopped now. She will bleed for 4-5 days every couple of weeks. She has some suprapubic pain but no flank pain. She has no dysuria. She has mild SUI. She has some frequency. She has some nocturia. She has some intermittency. She has no aggravating factors other than possibly heavy activity. She has had no issues with UTI's. She was told she had a stone about a year ago. She has had no GU surgery. She is no on blood thinners. she is a smoker. She had a hysterectomy for ovarian cancer but had no radiation or chemo.      ALLERGIES: Benadryl Codeine Flagyl Penicillin    MEDICATIONS: Cetirizine Hcl  Equetro 300 mg capsule,extended release multiphase 12hr  Fluoxetine Hcl 10 mg capsule  Lovastatin 40 mg tablet  Montelukast Sodium 10 mg tablet  Pantoprazole Sodium 40 mg tablet, delayed release     GU PSH: Hysterectomy    NON-GU PSH: Tonsillectomy    GU PMH: None     PMH Notes: ovarian cancer   NON-GU PMH: Anxiety disorder due to known physiological condition Depression Duodenal ulcer, unspecified as acute or chronic, without hemorrhage or perforation Gastro-esophageal reflux disease without esophagitis Type 2 diabetes mellitus without complications    FAMILY HISTORY: nephrolithiasis - Runs in Family   SOCIAL HISTORY: Marital Status: Married Current Smoking Status: Patient smokes occasionally. Has smoked since 09/12/2000.  Has never drank.  Drinks 1 caffeinated drink per day. 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     Notes: 1 daughter   REVIEW OF SYSTEMS:    GU Review Female:   Patient reports frequent urination, get up at night to urinate, leakage of urine, and stream starts and stops. Patient denies hard to postpone urination, burning /pain with urination, trouble starting your stream, have to strain to urinate, and currently pregnant.  Gastrointestinal (Upper):   Patient reports indigestion/ heartburn. Patient denies nausea and vomiting.  Gastrointestinal (Lower):   Patient denies diarrhea and constipation.  Constitutional:   Patient denies fever, night sweats, weight loss, and fatigue.  Skin:   Patient denies skin rash/ lesion and itching.  Eyes:   Patient denies blurred vision and double vision.  Ears/ Nose/ Throat:   Patient reports sinus problems. Patient denies sore throat.  Hematologic/Lymphatic:   Patient denies swollen glands and easy bruising.  Cardiovascular:   Patient denies leg swelling and chest pains.  Respiratory:   Patient denies cough and shortness of breath.  Endocrine:   Patient denies excessive thirst.  Musculoskeletal:   Patient reports back pain. Patient denies joint pain.  Neurological:   Patient reports headaches. Patient denies dizziness.  Psychologic:   Patient reports depression and anxiety.    VITAL SIGNS:      09/20/2015 01:46 PM  Weight 199 lb / 90.26 kg  Height 51 in / 129.54 cm  BP 159/109 mmHg  Pulse 109 /min  Temperature 98.3 F / 37 C  BMI 53.8 kg/m   GU PHYSICAL  EXAMINATION:    External Genitalia: No hirsutism, no rash, no scarring, no cyst, no erythematous lesion, no papular lesion, no blanched lesion, no warty lesion. No edema.  Urethral Meatus: Normal size. Normal position. No discharge.  Urethra: No tenderness, no mass, no scarring. No hypermobility. No leakage.  Bladder: Normal to palpation, no tenderness, no mass, normal size.  Vagina: No atrophy, no stenosis. No rectocele. No cystocele. No enterocele.  Cervix: S/P Hysterectomy   Uterus: S/P Hysterectomy  Adnexa / Parametria: No tenderness. No adnexal mass. Normal left ovary. Normal right ovary.  Anus and Perineum: No hemorrhoids. No anal stenosis. No rectal fissure, no anal fissure. No edema, no dimple, no perineal tenderness, no anal tenderness.   MULTI-SYSTEM PHYSICAL EXAMINATION:    Constitutional: Well-nourished. No physical deformities. Normally developed. Good grooming.  Neck: Neck symmetrical, not swollen. Normal tracheal position.  Respiratory: No labored breathing, no use of accessory muscles. CTA  Cardiovascular: Normal temperature, normal extremity pulses, no swelling, no varicosities. RRR without murmur.   Lymphatic: No enlargement of neck, axillae, groin.  Skin: No paleness, no jaundice, no cyanosis. No lesion, no ulcer, no rash.  Neurologic / Psychiatric: Oriented to time, oriented to place, oriented to person. No depression, no anxiety, no agitation.  Gastrointestinal: Obese abdomen. No hernia. No mass, no tenderness, no rigidity.   Musculoskeletal: Normal gait and station of head and neck.     PAST DATA REVIEWED:  Source Of History:  Patient  Records Review:   Previous Doctor Records  Urine Test Review:   Urinalysis  X-Ray Review: C.T. Abdomen/Pelvis: Reviewed Films. Reviewed Report. CT's from prior to 2013 reviewed and no GU findings noted.    Notes:                     Notes and labs from Burman Freestone NP with [facility].    PROCEDURES:         Flexible Cystoscopy - 52000  Risks, benefits, and some of the potential complications of the procedure were discussed.  Meatus:  Normal size. Normal location. Normal condition.  Urethra:  No hypermobility. No leakage.  Ureteral Orifices:  Normal location. Normal size. Normal shape. Effluxed clear urine.  Bladder:  She has a 2cm papillary tumor obscuring the right UO and 2 small tumors on the floor of the bladder and one at the right BN.       Multifocal bladder tumors with a 2cm  lesion at the right UO.  The procedure was well tolerated and there were no complications.         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 Abdomen/Pelvis With and Without I.V. Contrast - asap  Return Visit: Next Available Appointment - Schedule Surgery  Procedure: 09/20/2015 at [facility] [phone] - Flexible Cystoscopy (Cystoscopy) - 52000  Document Letter(s):  Created for Patient: Clinical Summary         Notes:   She needs a CT hematuria study and then will need to be set up for a TURBT with instillation of Epirubicin.   I have reviewed the risks of the procedure including bleeding,infection, bladder or ureteral injury, need for secondary procedures and subsequent BCG, chemical cystitis, thrombotic events and anesthetic complications.   CC: [facility].

## 2015-10-03 ENCOUNTER — Encounter (HOSPITAL_BASED_OUTPATIENT_CLINIC_OR_DEPARTMENT_OTHER)
Admission: RE | Disposition: A | Discharge status: Home / Self Care | Payer: Self-pay | Source: Ambulatory Visit | Attending: Urology

## 2015-10-03 ENCOUNTER — Ambulatory Visit (HOSPITAL_BASED_OUTPATIENT_CLINIC_OR_DEPARTMENT_OTHER): Payer: Medicaid Other | Admitting: Anesthesiology

## 2015-10-03 ENCOUNTER — Encounter (HOSPITAL_BASED_OUTPATIENT_CLINIC_OR_DEPARTMENT_OTHER): Payer: Self-pay | Admitting: *Deleted

## 2015-10-03 ENCOUNTER — Ambulatory Visit (HOSPITAL_BASED_OUTPATIENT_CLINIC_OR_DEPARTMENT_OTHER)
Admission: RE | Admit: 2015-10-03 | Discharge: 2015-10-03 | Disposition: A | Discharge status: Home / Self Care | Payer: Medicaid Other | Source: Ambulatory Visit | Attending: Urology | Admitting: Urology

## 2015-10-03 DIAGNOSIS — F329 Major depressive disorder, single episode, unspecified: Secondary | ICD-10-CM | POA: Insufficient documentation

## 2015-10-03 DIAGNOSIS — Z7984 Long term (current) use of oral hypoglycemic drugs: Secondary | ICD-10-CM | POA: Insufficient documentation

## 2015-10-03 DIAGNOSIS — Z6837 Body mass index (BMI) 37.0-37.9, adult: Secondary | ICD-10-CM | POA: Diagnosis not present

## 2015-10-03 DIAGNOSIS — C670 Malignant neoplasm of trigone of bladder: Secondary | ICD-10-CM | POA: Diagnosis present

## 2015-10-03 DIAGNOSIS — Z7951 Long term (current) use of inhaled steroids: Secondary | ICD-10-CM | POA: Insufficient documentation

## 2015-10-03 DIAGNOSIS — Z8543 Personal history of malignant neoplasm of ovary: Secondary | ICD-10-CM | POA: Diagnosis not present

## 2015-10-03 DIAGNOSIS — F17200 Nicotine dependence, unspecified, uncomplicated: Secondary | ICD-10-CM | POA: Diagnosis not present

## 2015-10-03 DIAGNOSIS — E669 Obesity, unspecified: Secondary | ICD-10-CM | POA: Insufficient documentation

## 2015-10-03 DIAGNOSIS — K219 Gastro-esophageal reflux disease without esophagitis: Secondary | ICD-10-CM | POA: Insufficient documentation

## 2015-10-03 DIAGNOSIS — Z79899 Other long term (current) drug therapy: Secondary | ICD-10-CM | POA: Diagnosis not present

## 2015-10-03 DIAGNOSIS — F064 Anxiety disorder due to known physiological condition: Secondary | ICD-10-CM | POA: Insufficient documentation

## 2015-10-03 DIAGNOSIS — E119 Type 2 diabetes mellitus without complications: Secondary | ICD-10-CM | POA: Insufficient documentation

## 2015-10-03 DIAGNOSIS — Z90710 Acquired absence of both cervix and uterus: Secondary | ICD-10-CM | POA: Insufficient documentation

## 2015-10-03 HISTORY — DX: Neoplasm of unspecified behavior of bladder: D49.4

## 2015-10-03 HISTORY — DX: Allergic rhinitis, unspecified: J30.9

## 2015-10-03 HISTORY — DX: Hyperlipidemia, unspecified: E78.5

## 2015-10-03 HISTORY — DX: Complete loss of teeth, unspecified cause, unspecified class: K08.109

## 2015-10-03 HISTORY — DX: Type 2 diabetes mellitus without complications: E11.9

## 2015-10-03 HISTORY — DX: Attention-deficit hyperactivity disorder, unspecified type: F90.9

## 2015-10-03 HISTORY — DX: Urgency of urination: R39.15

## 2015-10-03 HISTORY — DX: Presence of dental prosthetic device (complete) (partial): Z97.2

## 2015-10-03 HISTORY — DX: Presence of spectacles and contact lenses: Z97.3

## 2015-10-03 HISTORY — DX: Dizziness and giddiness: R42

## 2015-10-03 HISTORY — PX: CYSTOSCOPY: SHX5120

## 2015-10-03 HISTORY — DX: Bipolar disorder, unspecified: F31.9

## 2015-10-03 HISTORY — PX: TRANSURETHRAL RESECTION OF BLADDER TUMOR: SHX2575

## 2015-10-03 HISTORY — DX: Gastro-esophageal reflux disease without esophagitis: K21.9

## 2015-10-03 HISTORY — DX: Anemia, unspecified: D64.9

## 2015-10-03 HISTORY — DX: Gross hematuria: R31.0

## 2015-10-03 LAB — POCT I-STAT 4, (NA,K, GLUC, HGB,HCT)
GLUCOSE: 112 mg/dL — AB (ref 65–99)
HEMATOCRIT: 41 % (ref 36.0–46.0)
Hemoglobin: 13.9 g/dL (ref 12.0–15.0)
Potassium: 3.9 mmol/L (ref 3.5–5.1)
Sodium: 138 mmol/L (ref 135–145)

## 2015-10-03 LAB — GLUCOSE, CAPILLARY: Glucose-Capillary: 118 mg/dL — ABNORMAL HIGH (ref 65–99)

## 2015-10-03 SURGERY — TURBT (TRANSURETHRAL RESECTION OF BLADDER TUMOR)
Anesthesia: General | Site: Renal

## 2015-10-03 MED ORDER — LACTATED RINGERS IV SOLN
INTRAVENOUS | Status: DC
  Administered 2015-10-03: 08:00:00 via INTRAVENOUS
  Filled 2015-10-03: qty 1000

## 2015-10-03 MED ORDER — SODIUM CHLORIDE 0.9 % IV SOLN
50.0000 mg | Freq: Once | INTRAVENOUS | Status: AC
  Administered 2015-10-03: 50 mg via INTRAVESICAL
  Filled 2015-10-03 (×2): qty 25

## 2015-10-03 MED ORDER — PROPOFOL 10 MG/ML IV BOLUS
INTRAVENOUS | Status: DC | PRN
  Administered 2015-10-03: 20 mg via INTRAVENOUS
  Administered 2015-10-03: 150 mg via INTRAVENOUS

## 2015-10-03 MED ORDER — CIPROFLOXACIN IN D5W 400 MG/200ML IV SOLN
INTRAVENOUS | Status: AC
  Filled 2015-10-03: qty 200

## 2015-10-03 MED ORDER — OXYCODONE HCL 5 MG PO TABS
ORAL_TABLET | ORAL | Status: AC
  Filled 2015-10-03: qty 1

## 2015-10-03 MED ORDER — FENTANYL CITRATE (PF) 100 MCG/2ML IJ SOLN
25.0000 ug | INTRAMUSCULAR | Status: DC | PRN
  Filled 2015-10-03: qty 1

## 2015-10-03 MED ORDER — FENTANYL CITRATE (PF) 100 MCG/2ML IJ SOLN
INTRAMUSCULAR | Status: AC
  Filled 2015-10-03: qty 2

## 2015-10-03 MED ORDER — DEXAMETHASONE SODIUM PHOSPHATE 4 MG/ML IJ SOLN
INTRAMUSCULAR | Status: DC | PRN
  Administered 2015-10-03: 5 mg via INTRAVENOUS

## 2015-10-03 MED ORDER — SODIUM CHLORIDE 0.9 % IR SOLN
Status: DC | PRN
  Administered 2015-10-03: 6000 mL via INTRAVESICAL

## 2015-10-03 MED ORDER — BELLADONNA ALKALOIDS-OPIUM 16.2-60 MG RE SUPP
RECTAL | Status: AC
  Filled 2015-10-03: qty 1

## 2015-10-03 MED ORDER — CIPROFLOXACIN IN D5W 400 MG/200ML IV SOLN
400.0000 mg | INTRAVENOUS | Status: AC
  Administered 2015-10-03: 400 mg via INTRAVENOUS
  Filled 2015-10-03: qty 200

## 2015-10-03 MED ORDER — HYDROCODONE-ACETAMINOPHEN 5-325 MG PO TABS: 1.0000 | ORAL_TABLET | Freq: Four times a day (QID) | ORAL | 0 refills | Status: DC | PRN

## 2015-10-03 MED ORDER — DEXAMETHASONE SODIUM PHOSPHATE 10 MG/ML IJ SOLN
INTRAMUSCULAR | Status: AC
  Filled 2015-10-03: qty 1

## 2015-10-03 MED ORDER — PHENAZOPYRIDINE HCL 200 MG PO TABS: 200.0000 mg | ORAL_TABLET | Freq: Three times a day (TID) | ORAL | 1 refills | Status: DC | PRN

## 2015-10-03 MED ORDER — ONDANSETRON HCL 4 MG/2ML IJ SOLN
INTRAMUSCULAR | Status: AC
  Filled 2015-10-03: qty 2

## 2015-10-03 MED ORDER — ACETAMINOPHEN 325 MG PO TABS
650.0000 mg | ORAL_TABLET | ORAL | Status: DC | PRN
  Filled 2015-10-03: qty 2

## 2015-10-03 MED ORDER — MIDAZOLAM HCL 5 MG/5ML IJ SOLN
INTRAMUSCULAR | Status: DC | PRN
  Administered 2015-10-03: 2 mg via INTRAVENOUS

## 2015-10-03 MED ORDER — ONDANSETRON HCL 4 MG/2ML IJ SOLN
INTRAMUSCULAR | Status: DC | PRN
  Administered 2015-10-03: 4 mg via INTRAVENOUS

## 2015-10-03 MED ORDER — OXYCODONE HCL 5 MG PO TABS
5.0000 mg | ORAL_TABLET | ORAL | Status: DC | PRN
  Administered 2015-10-03: 5 mg via ORAL
  Filled 2015-10-03: qty 2

## 2015-10-03 MED ORDER — SODIUM CHLORIDE 0.9 % IV SOLN
250.0000 mL | INTRAVENOUS | Status: DC | PRN
  Filled 2015-10-03: qty 250

## 2015-10-03 MED ORDER — ACETAMINOPHEN 650 MG RE SUPP
650.0000 mg | RECTAL | Status: DC | PRN
  Filled 2015-10-03: qty 1

## 2015-10-03 MED ORDER — SODIUM CHLORIDE 0.9% FLUSH
3.0000 mL | INTRAVENOUS | Status: DC | PRN
Start: 2015-10-03 — End: 2015-10-03
  Filled 2015-10-03: qty 3

## 2015-10-03 MED ORDER — ACETAMINOPHEN 500 MG PO TABS
1000.0000 mg | ORAL_TABLET | Freq: Once | ORAL | Status: AC
  Administered 2015-10-03: 1000 mg via ORAL
  Filled 2015-10-03: qty 2

## 2015-10-03 MED ORDER — PROMETHAZINE HCL 25 MG/ML IJ SOLN
6.2500 mg | INTRAMUSCULAR | Status: DC | PRN
  Filled 2015-10-03: qty 1

## 2015-10-03 MED ORDER — FENTANYL CITRATE (PF) 100 MCG/2ML IJ SOLN
INTRAMUSCULAR | Status: DC | PRN
  Administered 2015-10-03 (×2): 50 ug via INTRAVENOUS

## 2015-10-03 MED ORDER — SODIUM CHLORIDE 0.9% FLUSH
3.0000 mL | Freq: Two times a day (BID) | INTRAVENOUS | Status: DC
  Filled 2015-10-03: qty 3

## 2015-10-03 MED ORDER — MIDAZOLAM HCL 2 MG/2ML IJ SOLN
INTRAMUSCULAR | Status: AC
  Filled 2015-10-03: qty 2

## 2015-10-03 MED ORDER — ACETAMINOPHEN 500 MG PO TABS
ORAL_TABLET | ORAL | Status: AC
  Filled 2015-10-03: qty 2

## 2015-10-03 MED ORDER — LIDOCAINE HCL (CARDIAC) 20 MG/ML IV SOLN
INTRAVENOUS | Status: DC | PRN
  Administered 2015-10-03: 60 mg via INTRAVENOUS

## 2015-10-03 MED ORDER — PROPOFOL 10 MG/ML IV BOLUS
INTRAVENOUS | Status: AC
  Filled 2015-10-03: qty 20

## 2015-10-03 SURGICAL SUPPLY — 37 items
BAG DRAIN URO-CYSTO SKYTR STRL (DRAIN) ×5 IMPLANT
BAG URINE DRAINAGE (UROLOGICAL SUPPLIES) ×5 IMPLANT
BAG URINE LEG 19OZ MD ST LTX (BAG) IMPLANT
CATH FOLEY 2WAY SLVR  5CC 20FR (CATHETERS) ×2
CATH FOLEY 2WAY SLVR  5CC 22FR (CATHETERS)
CATH FOLEY 2WAY SLVR 5CC 20FR (CATHETERS) ×3 IMPLANT
CATH FOLEY 2WAY SLVR 5CC 22FR (CATHETERS) IMPLANT
CATH URET 5FR 28IN CONE TIP (BALLOONS)
CATH URET 5FR 28IN OPEN ENDED (CATHETERS) ×5 IMPLANT
CATH URET 5FR 70CM CONE TIP (BALLOONS) IMPLANT
CLOTH BEACON ORANGE TIMEOUT ST (SAFETY) ×5 IMPLANT
ELECT REM PT RETURN 9FT ADLT (ELECTROSURGICAL) ×5
ELECTRODE REM PT RTRN 9FT ADLT (ELECTROSURGICAL) ×3 IMPLANT
GLOVE BIO SURGEON STRL SZ7 (GLOVE) ×5 IMPLANT
GLOVE BIOGEL PI IND STRL 7.0 (GLOVE) ×3 IMPLANT
GLOVE BIOGEL PI INDICATOR 7.0 (GLOVE) ×2
GLOVE SURG SS PI 8.0 STRL IVOR (GLOVE) ×5 IMPLANT
GOWN STRL REUS W/ TWL LRG LVL3 (GOWN DISPOSABLE) ×3 IMPLANT
GOWN STRL REUS W/ TWL XL LVL3 (GOWN DISPOSABLE) ×3 IMPLANT
GOWN STRL REUS W/TWL LRG LVL3 (GOWN DISPOSABLE) ×2
GOWN STRL REUS W/TWL XL LVL3 (GOWN DISPOSABLE) ×2
GUIDEWIRE 0.038 PTFE COATED (WIRE) IMPLANT
GUIDEWIRE ANG ZIPWIRE 038X150 (WIRE) IMPLANT
GUIDEWIRE STR DUAL SENSOR (WIRE) IMPLANT
HOLDER FOLEY CATH W/STRAP (MISCELLANEOUS) ×5 IMPLANT
IV NS IRRIG 3000ML ARTHROMATIC (IV SOLUTION) ×10 IMPLANT
KIT ROOM TURNOVER WOR (KITS) ×5 IMPLANT
LOOP CUT BIPOLAR 24F LRG (ELECTROSURGICAL) ×5 IMPLANT
MANIFOLD NEPTUNE II (INSTRUMENTS) IMPLANT
NS IRRIG 1000ML POUR BTL (IV SOLUTION) ×5 IMPLANT
NS IRRIG 500ML POUR BTL (IV SOLUTION) IMPLANT
PACK CYSTO (CUSTOM PROCEDURE TRAY) ×5 IMPLANT
PLUG CATH AND CAP STER (CATHETERS) IMPLANT
SET ASPIRATION TUBING (TUBING) IMPLANT
SYRINGE IRR TOOMEY STRL 70CC (SYRINGE) IMPLANT
TUBE CONNECTING 12'X1/4 (SUCTIONS)
TUBE CONNECTING 12X1/4 (SUCTIONS) IMPLANT

## 2015-10-03 NOTE — Brief Op Note (Signed)
10/03/2015  10:31 AM  PATIENT:  Sandra [name]  40 y.o. 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   PROCEDURE:  Procedure(s): TRANSURETHRAL RESECTION OF BLADDER TUMOR (TURBT) (N/A) 2-5cm,  Instillation of Epirubicin in PACU.  SURGEON:  Surgeon(s) and Role:    * Irine Seal, MD - Primary  PHYSICIAN ASSISTANT:   ASSISTANTS: none   ANESTHESIA:   general  EBL:  Total I/O In: 300 [I.V.:300] Out: -   BLOOD ADMINISTERED:none  DRAINS: Urinary Catheter (Foley)   LOCAL MEDICATIONS USED:  NONE  SPECIMEN:  Source of Specimen:  Bladder tumor  DISPOSITION OF SPECIMEN:  PATHOLOGY  COUNTS:  YES  TOURNIQUET:  * No tourniquets in log *  DICTATION: .Other Dictation: Dictation Number  [id number]  PLAN OF CARE: Discharge to home after PACU  PATIENT DISPOSITION:  PACU - hemodynamically stable.   Delay start of Pharmacological VTE agent (>24hrs) due to surgical blood loss or risk of bleeding: not applicable

## 2015-10-03 NOTE — Interval H&P Note (Signed)
History and Physical Interval Note:  Her CT showed no ureteral obstruction or other worrisome findings.   She has continued to have bleeding.   10/03/2015 8:41 AM  Sandra [name]  has presented today for surgery, with the diagnosis of BLADDER TUMOR  The various methods of treatment have been discussed with the patient and family. After consideration of risks, benefits and other options for treatment, the patient has consented to  Procedure(s): TRANSURETHRAL RESECTION OF BLADDER TUMOR (TURBT) (N/A) CYSTOSCOPY WITH POSSIBLE STENT PLACEMENT, INSTILLATION OF EPIRUBICIN (N/A) as a surgical intervention .  The patient's history has been reviewed, patient examined, no change in status, stable for surgery.  I have reviewed the patient's chart and labs.  Questions were answered to the patient's satisfaction.     Anjuli Gemmill J

## 2015-10-03 NOTE — Discharge Instructions (Addendum)
CYSTOSCOPY HOME CARE INSTRUCTIONS  Activity: Rest for the remainder of the day.  Do not drive or operate equipment today.  You may resume normal activities in one to two days as instructed by your physician.   Meals: Drink plenty of liquids and eat light foods such as gelatin or soup this evening.  You may return to a normal meal plan tomorrow.  Return to Work: You may return to work in one to two days or as instructed by your physician.  Special Instructions / Symptoms: Call your physician if any of these symptoms occur:   -persistent or heavy bleeding  -bleeding which continues after first few urination  -large blood clots that are difficult to pass  -urine stream diminishes or stops completely  -fever equal to or higher than 101 degrees Farenheit.  -cloudy urine with a strong, foul odor  -severe pain  Females should always wipe from front to back after elimination.  You may feel some burning pain when you urinate.  This should disappear with time.  Applying moist heat to the lower abdomen or a hot tub bath may help relieve the pain. \  You may remove the catheter in the morning and if you aren't comfortable doing that you may come to the office in the morning to have that done.   Patient Signature:  ________________________________________________________  Nurse's Signature:  ________________________________________________________   Foley Catheter Care, Adult A Foley catheter is a soft, flexible tube. This tube is placed into your bladder to drain pee (urine). If you go home with this catheter in place, follow the instructions below. TAKING CARE OF THE CATHETER 1. Wash your hands with soap and water. 2. Put soap and water on a clean washcloth.  Clean the skin where the tube goes into your body.  Clean away from the tube site.  Never wipe toward the tube.  Clean the area using a circular motion.  Remove all the soap. Pat the area dry with a clean towel. For males,  reposition the skin that covers the end of the penis (foreskin). 3. Attach the tube to your leg with tape or a leg strap. Do not stretch the tube tight. If you are using tape, remove any stickiness left behind by past tape you used. 4. Keep the drainage bag below your hips. Keep it off the floor. 5. Check your tube during the day. Make sure it is working and draining. Make sure the tube does not curl, twist, or bend. 6. Do not pull on the tube or try to take it out. TAKING CARE OF THE DRAINAGE BAGS You will have a large overnight drainage bag and a small leg bag. You may wear the overnight bag any time. Never wear the small bag at night. Follow the directions below. Emptying the Drainage Bag Empty your drainage bag when it is  - full or at least 2-3 times a day. 1. Wash your hands with soap and water. 2. Keep the drainage bag below your hips. 3. Hold the dirty bag over the toilet or clean container. 4. Open the pour spout at the bottom of the bag. Empty the pee into the toilet or container. Do not let the pour spout touch anything. 5. Clean the pour spout with a gauze pad or cotton ball that has rubbing alcohol on it. 6. Close the pour spout. 7. Attach the bag to your leg with tape or a leg strap. 8. Wash your hands well. Changing the Drainage Bag Change your bag  once a month or sooner if it starts to smell or look dirty.  1. Wash your hands with soap and water. 2. Pinch the rubber tube so that pee does not spill out. 3. Disconnect the catheter tube from the drainage tube at the connection valve. Do not let the tubes touch anything. 4. Clean the end of the catheter tube with an alcohol wipe. Clean the end of a the drainage tube with a different alcohol wipe. 5. Connect the catheter tube to the drainage tube of the clean drainage bag. 6. Attach the new bag to the leg with tape or a leg strap. Avoid attaching the new bag too tightly. 7. Wash your hands well. Cleaning the Drainage  Bag 1. Wash your hands with soap and water. 2. Wash the bag in warm, soapy water. 3. Rinse the bag with warm water. 4. Fill the bag with a mixture of white vinegar and water (1 cup vinegar to 1 quart warm water [.2 liter vinegar to 1 liter warm water]). Close the bag and soak it for 30 minutes in the solution. 5. Rinse the bag with warm water. 6. Hang the bag to dry with the pour spout open and hanging downward. 7. Store the clean bag (once it is dry) in a clean plastic bag. 8. Wash your hands well. PREVENT INFECTION  Wash your hands before and after touching your tube.  Take showers every day. Wash the skin where the tube enters your body. Do not take baths. Replace wet leg straps with dry ones, if this applies.  Do not use powders, sprays, or lotions on the genital area. Only use creams, lotions, or ointments as told by your doctor.  For females, wipe from front to back after going to the bathroom.  Drink enough fluids to keep your pee clear or pale yellow unless you are told not to have too much fluid (fluid restriction).  Do not let the drainage bag or tubing touch or lie on the floor.  Wear cotton underwear to keep the area dry. GET HELP IF:  Your pee is cloudy or smells unusually bad.  Your tube becomes clogged.  You are not draining pee into the bag or your bladder feels full.  Your tube starts to leak. GET HELP RIGHT AWAY IF:  You have pain, puffiness (swelling), redness, or yellowish-white fluid (pus) where the tube enters the body.  You have pain in the belly (abdomen), legs, lower back, or bladder.  You have a fever.  You see blood fill the tube, or your pee is pink or red.  You feel sick to your stomach (nauseous), throw up (vomit), or have chills.  Your tube gets pulled out. MAKE SURE YOU:   Understand these instructions.  Will watch your condition.  Will get help right away if you are not doing well or get worse.   This information is not intended  to replace advice given to you by your health care provider. Make sure you discuss any questions you have with your health care provider.   Document Released: 04/25/2012 Document Revised: 01/19/2014 Document Reviewed: 04/25/2012 Elsevier Interactive Patient Education 2016 [place] Instructions  Activity: Get plenty of rest for the remainder of the day. A responsible adult should stay with you for 24 hours following the procedure.  For the next 24 hours, DO NOT: -Drive a car -Paediatric nurse -Drink alcoholic beverages -Take any medication unless instructed by your physician -Make any legal decisions  or sign important papers.  Meals: Start with liquid foods such as gelatin or soup. Progress to regular foods as tolerated. Avoid greasy, spicy, heavy foods. If nausea and/or vomiting occur, drink only clear liquids until the nausea and/or vomiting subsides. Call your physician if vomiting continues.  Special Instructions/Symptoms: Your throat may feel dry or sore from the anesthesia or the breathing tube placed in your throat during surgery. If this causes discomfort, gargle with warm salt water. The discomfort should disappear within 24 hours.  If you had a scopolamine patch placed behind your ear for the management of post- operative nausea and/or vomiting:  1. The medication in the patch is effective for 72 hours, after which it should be removed.  Wrap patch in a tissue and discard in the trash. Wash hands thoroughly with soap and water. 2. You may remove the patch earlier than 72 hours if you experience unpleasant side effects which may include dry mouth, dizziness or visual disturbances. 3. Avoid touching the patch. Wash your hands with soap and water after contact with the patch.

## 2015-10-03 NOTE — Anesthesia Postprocedure Evaluation (Signed)
Anesthesia Post Note  Patient: Sandra [name]  Procedure(s) Performed: Procedure(s) (LRB): TRANSURETHRAL RESECTION OF BLADDER TUMOR (TURBT) (N/A) CYSTOSCOPY (N/A)  Patient location during evaluation: PACU Anesthesia Type: General Level of consciousness: awake and alert Pain management: pain level controlled Vital Signs Assessment: post-procedure vital signs reviewed and stable Respiratory status: spontaneous breathing, nonlabored ventilation, respiratory function stable and patient connected to nasal cannula oxygen Cardiovascular status: blood pressure returned to baseline and stable Postop Assessment: no signs of nausea or vomiting Anesthetic complications: no    Last Vitals:  Vitals:   10/03/15 1230 10/03/15 1245  BP: 137/82 137/75  Pulse: (!) 56 (!) 59  Resp: 20 18  Temp:      Last Pain:  Vitals:   10/03/15 1245  TempSrc:   PainSc: 9                  Sandra [name] J

## 2015-10-03 NOTE — Anesthesia Procedure Notes (Signed)
Procedure Name: LMA Insertion Date/Time: 10/03/2015 10:05 AM Performed by: Wanita Chamberlain Pre-anesthesia Checklist: Patient identified, Timeout performed, Emergency Drugs available, Suction available and Patient being monitored Patient Re-evaluated:Patient Re-evaluated prior to inductionOxygen Delivery Method: Circle system utilized Preoxygenation: Pre-oxygenation with 100% oxygen Intubation Type: IV induction Ventilation: Mask ventilation without difficulty LMA: LMA inserted LMA Size: 4.0 Number of attempts: 1 Placement Confirmation: breath sounds checked- equal and bilateral and positive ETCO2 Tube secured with: Tape Dental Injury: Teeth and Oropharynx as per pre-operative assessment

## 2015-10-03 NOTE — Anesthesia Preprocedure Evaluation (Addendum)
Anesthesia Evaluation  Patient identified by MRN, date of birth, ID band Patient awake    Reviewed: Allergy & Precautions, NPO status , Patient's Chart, lab work & pertinent test results  Airway Mallampati: II  TM Distance: >3 FB Neck ROM: Full    Dental  (+) Edentulous Lower, Edentulous Upper   Pulmonary Current Smoker,    Pulmonary exam normal breath sounds clear to auscultation       Cardiovascular negative cardio ROS Normal cardiovascular exam Rhythm:Regular Rate:Normal     Neuro/Psych PSYCHIATRIC DISORDERS  Neuromuscular disease    GI/Hepatic Neg liver ROS, GERD  Medicated,  Endo/Other  diabetes, Type 2, Oral Hypoglycemic Agents  Renal/GU negative Renal ROS  negative genitourinary   Musculoskeletal negative musculoskeletal ROS (+)   Abdominal (+) + obese,   Peds negative pediatric ROS (+)  Hematology  (+) anemia ,   Anesthesia Other Findings   Reproductive/Obstetrics negative OB ROS                            Anesthesia Physical Anesthesia Plan  ASA: III  Anesthesia Plan: General   Post-op Pain Management:    Induction: Intravenous  Airway Management Planned: LMA  Additional Equipment:   Intra-op Plan:   Post-operative Plan: Extubation in OR  Informed Consent: I have reviewed the patients History and Physical, chart, labs and discussed the procedure including the risks, benefits and alternatives for the proposed anesthesia with the patient or authorized representative who has indicated his/her understanding and acceptance.   Dental advisory given  Plan Discussed with: CRNA  Anesthesia Plan Comments:         Anesthesia Quick Evaluation

## 2015-10-03 NOTE — Transfer of Care (Signed)
Immediate Anesthesia Transfer of Care Note  Patient: [name]  Procedure(s) Performed: Procedure(s): TRANSURETHRAL RESECTION OF BLADDER TUMOR (TURBT) (N/A) CYSTOSCOPY (N/A)  Patient Location: PACU  Anesthesia Type:General  Level of Consciousness: awake, alert , oriented and patient cooperative  Airway & Oxygen Therapy: Patient Spontanous Breathing and Patient connected to nasal cannula oxygen  Post-op Assessment: Report given to RN and Post -op Vital signs reviewed and stable  Post vital signs: Reviewed and stable  Last Vitals:  Vitals:   10/03/15 0749  BP: 131/66  Pulse: 64  Resp: 16  Temp: 36.9 C    Last Pain:  Vitals:   10/03/15 0807  TempSrc:   PainSc: 8       Patients Stated Pain Goal: 9 ([id number])  Complications: No apparent anesthesia complications

## 2015-10-04 ENCOUNTER — Encounter (HOSPITAL_BASED_OUTPATIENT_CLINIC_OR_DEPARTMENT_OTHER): Payer: Self-pay | Admitting: Urology

## 2015-10-04 NOTE — Op Note (Signed)
Sandra [name], Sandra [name]               ACCOUNT NO.:  [id number]  MEDICAL RECORD NO.:  [id number]  LOCATION:                                 FACILITY:  PHYSICIAN:  Marshall Cork. Jeffie Pollock, M.D.    DATE OF BIRTH:  1975-05-01  DATE OF PROCEDURE:  10/03/2015 DATE OF DISCHARGE:                              OPERATIVE REPORT   PROCEDURE:  Cystoscopy with transurethral resection of greater than 2 cm bladder tumor from the right trigone and fulguration of less than 1 cm of greater than 5 mm tumors from the bladder neck and posterior wall and floor of the bladder.  Instillation of epirubicin in the PACU.  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  SURGEON:  Marshall Cork. Jeffie Pollock, M.D.  ANESTHESIA:  General.  SPECIMEN:  Bladder tumor.  BLOOD LOSS:  Minimal.  DRAINS:  A 20-French Foley catheter.  COMPLICATIONS:  None.  INDICATIONS:  Ms. Sandra [name] is a 40 year old white female, who presented recently with gross hematuria.  Office cystoscopy demonstrated multifocal bladder tumors with the largest tumor in the area of the right trigone, ureteral orifice.  I could not see the orifice in office cysto.  She subsequently underwent a CT scan, which revealed no upper tract disease or obstruction and no evidence of deep invasion into the bladder wall.  The tumor measured 1.2 cm on CT scan.  FINDINGS OF PROCEDURE:  She was taken to the operating room where general anesthetic was induced.  She was given Cipro.  She was placed in lithotomy position.  Her perineum and genitalia were prepped with Betadine solution and she was draped in usual sterile fashion.  Cystoscopy was performed using a 23-French scope and a 30-degree lens. Examination revealed a normal urethra.  The bladder wall was smooth with an approximately 8 mm tumor on the posterior wall to the right of midline, an  approximately 8 mm tumor on the floor of the bladder in the midline, a 6-8 mm tumor on the right bladder neck, and the primary tumor mass which was just lateral to the right ureteral orifice, which was readily visualized on cystoscopy today.  The left ureteral orifice was unremarkable.  Once the diagnostic cystoscopy had been performed, the 28-French continuous flow resectoscope sheath was inserted.  This was fitted with an Beatrix Fetters handle with a bipolar loop and 30-degree lens with saline as the irrigant.  The smaller lesions were fulgurated generously and then the larger tumor was resected.  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  These areas were resected as possible and fulgurated for completeness.  Great care was taken to avoid injury to the right ureteral orifice.  Once the tumor had been completely resected and hemostasis was achieved, the bladder was evacuated and free of tissue which was collected and sent to Pathology.  Final inspection revealed good hemostasis and no residual tumor fronds.  At this point, the resectoscope was removed and a 20- [place] Foley catheter was  inserted.  The balloon was filled with 10 mL of sterile fluid and the catheter was irrigated with clear return.  The patient was taken down from lithotomy position and her anesthetic was reversed.  She was moved to recovery room in stable condition, where she underwent instillation of 50 mg of epirubicin which was left indwelling for 1 hour.  She was discharged home with the Foley in place. There were no complications.     Marshall Cork. Jeffie Pollock, M.D.   ______________________________ Marshall Cork. Jeffie Pollock, M.D.    JJW/MEDQ  D:  10/03/2015  T:  10/04/2015  Job:  [id number]

## 2015-10-24 ENCOUNTER — Other Ambulatory Visit: Payer: Self-pay | Admitting: Urology

## 2015-10-25 ENCOUNTER — Other Ambulatory Visit (HOSPITAL_COMMUNITY)
Admission: RE | Admit: 2015-10-25 | Discharge: 2015-10-25 | Disposition: A | Discharge status: Home / Self Care | Payer: Medicaid Other | Source: Other Acute Inpatient Hospital | Attending: Urology | Admitting: Urology

## 2015-10-25 ENCOUNTER — Ambulatory Visit (INDEPENDENT_AMBULATORY_CARE_PROVIDER_SITE_OTHER): Payer: Medicaid Other | Admitting: Urology

## 2015-10-25 DIAGNOSIS — R350 Frequency of micturition: Secondary | ICD-10-CM | POA: Diagnosis present

## 2015-10-25 DIAGNOSIS — C670 Malignant neoplasm of trigone of bladder: Secondary | ICD-10-CM

## 2015-10-25 DIAGNOSIS — N310 Uninhibited neuropathic bladder, not elsewhere classified: Secondary | ICD-10-CM | POA: Insufficient documentation

## 2015-10-25 DIAGNOSIS — N3941 Urge incontinence: Secondary | ICD-10-CM | POA: Diagnosis not present

## 2015-10-27 LAB — URINE CULTURE

## 2015-10-29 ENCOUNTER — Encounter (HOSPITAL_BASED_OUTPATIENT_CLINIC_OR_DEPARTMENT_OTHER): Payer: Self-pay | Admitting: *Deleted

## 2015-10-29 NOTE — Progress Notes (Signed)
Hx reviewed w pt.  Pt states no change from last visit.  Instructed pt to be npo p mn 10/18 x am meds w sip of water.  No metformin am of surgery.  Needs istat on arrival.  ekg w chart.

## 2015-10-31 ENCOUNTER — Ambulatory Visit (HOSPITAL_BASED_OUTPATIENT_CLINIC_OR_DEPARTMENT_OTHER): Payer: Medicaid Other | Admitting: Certified Registered"

## 2015-10-31 ENCOUNTER — Encounter (HOSPITAL_BASED_OUTPATIENT_CLINIC_OR_DEPARTMENT_OTHER): Payer: Self-pay | Admitting: Certified Registered"

## 2015-10-31 ENCOUNTER — Encounter (HOSPITAL_COMMUNITY)
Admission: RE | Disposition: A | Discharge status: Home / Self Care | Payer: Self-pay | Source: Ambulatory Visit | Attending: Urology

## 2015-10-31 ENCOUNTER — Inpatient Hospital Stay (HOSPITAL_BASED_OUTPATIENT_CLINIC_OR_DEPARTMENT_OTHER)
Admission: RE | Admit: 2015-10-31 | Discharge: 2015-11-04 | DRG: 669 | Disposition: A | Discharge status: Home / Self Care | Payer: Medicaid Other | Source: Ambulatory Visit | Attending: Urology | Admitting: Urology

## 2015-10-31 DIAGNOSIS — C670 Malignant neoplasm of trigone of bladder: Secondary | ICD-10-CM | POA: Diagnosis not present

## 2015-10-31 DIAGNOSIS — K219 Gastro-esophageal reflux disease without esophagitis: Secondary | ICD-10-CM | POA: Diagnosis not present

## 2015-10-31 DIAGNOSIS — Z90710 Acquired absence of both cervix and uterus: Secondary | ICD-10-CM

## 2015-10-31 DIAGNOSIS — Z888 Allergy status to other drugs, medicaments and biological substances status: Secondary | ICD-10-CM

## 2015-10-31 DIAGNOSIS — R310 Gross hematuria: Secondary | ICD-10-CM | POA: Diagnosis present

## 2015-10-31 DIAGNOSIS — E119 Type 2 diabetes mellitus without complications: Secondary | ICD-10-CM | POA: Diagnosis not present

## 2015-10-31 DIAGNOSIS — Z880 Allergy status to penicillin: Secondary | ICD-10-CM

## 2015-10-31 DIAGNOSIS — Z886 Allergy status to analgesic agent status: Secondary | ICD-10-CM

## 2015-10-31 DIAGNOSIS — C679 Malignant neoplasm of bladder, unspecified: Secondary | ICD-10-CM | POA: Diagnosis present

## 2015-10-31 DIAGNOSIS — D62 Acute posthemorrhagic anemia: Secondary | ICD-10-CM | POA: Diagnosis not present

## 2015-10-31 DIAGNOSIS — Z8543 Personal history of malignant neoplasm of ovary: Secondary | ICD-10-CM

## 2015-10-31 DIAGNOSIS — F17200 Nicotine dependence, unspecified, uncomplicated: Secondary | ICD-10-CM | POA: Diagnosis present

## 2015-10-31 HISTORY — PX: TRANSURETHRAL RESECTION OF BLADDER TUMOR: SHX2575

## 2015-10-31 HISTORY — PX: CYSTOSCOPY WITH STENT PLACEMENT: SHX5790

## 2015-10-31 LAB — GLUCOSE, CAPILLARY: GLUCOSE-CAPILLARY: 119 mg/dL — AB (ref 65–99)

## 2015-10-31 LAB — POCT I-STAT 4, (NA,K, GLUC, HGB,HCT)
Glucose, Bld: 139 mg/dL — ABNORMAL HIGH (ref 65–99)
HCT: 40 % (ref 36.0–46.0)
Hemoglobin: 13.6 g/dL (ref 12.0–15.0)
Potassium: 4.8 mmol/L (ref 3.5–5.1)
Sodium: 137 mmol/L (ref 135–145)

## 2015-10-31 LAB — HEMOGLOBIN AND HEMATOCRIT, BLOOD
HEMATOCRIT: 33.2 % — AB (ref 36.0–46.0)
HEMOGLOBIN: 10.4 g/dL — AB (ref 12.0–15.0)

## 2015-10-31 SURGERY — TURBT (TRANSURETHRAL RESECTION OF BLADDER TUMOR)
Anesthesia: General | Site: Ureter | Laterality: Right

## 2015-10-31 MED ORDER — SODIUM CHLORIDE 0.9% FLUSH
3.0000 mL | Freq: Two times a day (BID) | INTRAVENOUS | Status: DC
  Filled 2015-10-31: qty 3

## 2015-10-31 MED ORDER — LORATADINE 10 MG PO TABS
10.0000 mg | ORAL_TABLET | Freq: Every day | ORAL | Status: DC
  Administered 2015-10-31 – 2015-11-04 (×5): 10 mg via ORAL
  Filled 2015-10-31 (×5): qty 1

## 2015-10-31 MED ORDER — TRAMADOL-ACETAMINOPHEN 37.5-325 MG PO TABS: 1.0000 | ORAL_TABLET | Freq: Four times a day (QID) | ORAL | 0 refills | Status: DC | PRN

## 2015-10-31 MED ORDER — LIDOCAINE 2% (20 MG/ML) 5 ML SYRINGE
INTRAMUSCULAR | Status: DC | PRN
  Administered 2015-10-31: 40 mg via INTRAVENOUS

## 2015-10-31 MED ORDER — CEFAZOLIN SODIUM-DEXTROSE 2-4 GM/100ML-% IV SOLN
2.0000 g | INTRAVENOUS | Status: AC
  Administered 2015-10-31: 2 g via INTRAVENOUS
  Filled 2015-10-31: qty 100

## 2015-10-31 MED ORDER — PRAVASTATIN SODIUM 20 MG PO TABS
10.0000 mg | ORAL_TABLET | Freq: Every day | ORAL | Status: DC
  Administered 2015-10-31 – 2015-11-03 (×4): 10 mg via ORAL
  Filled 2015-10-31 (×4): qty 1

## 2015-10-31 MED ORDER — POTASSIUM CHLORIDE IN NACL 20-0.45 MEQ/L-% IV SOLN
INTRAVENOUS | Status: DC
  Administered 2015-10-31: 19:00:00 via INTRAVENOUS
  Filled 2015-10-31 (×2): qty 1000

## 2015-10-31 MED ORDER — PROPOFOL 10 MG/ML IV BOLUS
INTRAVENOUS | Status: DC | PRN
  Administered 2015-10-31: 150 mg via INTRAVENOUS

## 2015-10-31 MED ORDER — CARBAMAZEPINE ER 200 MG PO CP12
300.0000 mg | ORAL_CAPSULE | Freq: Two times a day (BID) | ORAL | Status: DC
  Administered 2015-10-31 – 2015-11-01 (×3): 300 mg via ORAL
  Filled 2015-10-31 (×9): qty 1

## 2015-10-31 MED ORDER — DICLOFENAC SODIUM 75 MG PO TBEC
75.0000 mg | DELAYED_RELEASE_TABLET | Freq: Two times a day (BID) | ORAL | Status: DC
  Administered 2015-11-01 – 2015-11-04 (×7): 75 mg via ORAL
  Filled 2015-10-31 (×7): qty 1

## 2015-10-31 MED ORDER — LACTATED RINGERS IV SOLN
INTRAVENOUS | Status: DC
  Administered 2015-10-31 (×3): via INTRAVENOUS
  Filled 2015-10-31: qty 1000

## 2015-10-31 MED ORDER — ACETAMINOPHEN 650 MG RE SUPP
650.0000 mg | RECTAL | Status: DC | PRN
  Filled 2015-10-31: qty 1

## 2015-10-31 MED ORDER — FLUOXETINE HCL 10 MG PO CAPS: 10.0000 mg | ORAL_CAPSULE | Freq: Every morning | ORAL | Status: DC

## 2015-10-31 MED ORDER — SODIUM CHLORIDE 0.9% FLUSH
3.0000 mL | INTRAVENOUS | Status: DC | PRN
  Filled 2015-10-31: qty 3

## 2015-10-31 MED ORDER — BISACODYL 10 MG RE SUPP: 10.0000 mg | Freq: Every day | RECTAL | Status: DC | PRN

## 2015-10-31 MED ORDER — MIDAZOLAM HCL 2 MG/2ML IJ SOLN
INTRAMUSCULAR | Status: AC
Start: 2015-10-31 — End: 2015-10-31
  Filled 2015-10-31: qty 2

## 2015-10-31 MED ORDER — PANTOPRAZOLE SODIUM 40 MG PO TBEC
40.0000 mg | DELAYED_RELEASE_TABLET | Freq: Every evening | ORAL | Status: DC
  Administered 2015-10-31 – 2015-11-03 (×4): 40 mg via ORAL
  Filled 2015-10-31 (×4): qty 1

## 2015-10-31 MED ORDER — OXYCODONE HCL 5 MG PO TABS
5.0000 mg | ORAL_TABLET | ORAL | Status: DC | PRN
  Administered 2015-11-01: 10 mg via ORAL
  Administered 2015-11-02: 5 mg via ORAL
  Administered 2015-11-03 – 2015-11-04 (×3): 10 mg via ORAL
  Filled 2015-10-31 (×4): qty 2
  Filled 2015-10-31: qty 1
  Filled 2015-10-31: qty 2

## 2015-10-31 MED ORDER — PROPOFOL 10 MG/ML IV BOLUS
INTRAVENOUS | Status: AC
  Filled 2015-10-31: qty 20

## 2015-10-31 MED ORDER — METFORMIN HCL 500 MG PO TABS
500.0000 mg | ORAL_TABLET | Freq: Two times a day (BID) | ORAL | Status: DC
  Administered 2015-11-01 – 2015-11-04 (×7): 500 mg via ORAL
  Filled 2015-10-31 (×7): qty 1

## 2015-10-31 MED ORDER — INSULIN ASPART 100 UNIT/ML ~~LOC~~ SOLN
0.0000 [IU] | Freq: Three times a day (TID) | SUBCUTANEOUS | Status: DC
  Administered 2015-11-01: 2 [IU] via SUBCUTANEOUS
  Administered 2015-11-01: 5 [IU] via SUBCUTANEOUS
  Administered 2015-11-01: 2 [IU] via SUBCUTANEOUS
  Administered 2015-11-02: 1 [IU] via SUBCUTANEOUS
  Administered 2015-11-02 – 2015-11-04 (×4): 2 [IU] via SUBCUTANEOUS

## 2015-10-31 MED ORDER — CEFAZOLIN SODIUM-DEXTROSE 2-4 GM/100ML-% IV SOLN
INTRAVENOUS | Status: AC
  Filled 2015-10-31: qty 100

## 2015-10-31 MED ORDER — FLUTICASONE PROPIONATE 50 MCG/ACT NA SUSP
2.0000 | Freq: Every day | NASAL | Status: DC
  Administered 2015-11-02 – 2015-11-04 (×2): 2 via NASAL
  Filled 2015-10-31: qty 16

## 2015-10-31 MED ORDER — SENNOSIDES-DOCUSATE SODIUM 8.6-50 MG PO TABS: 1.0000 | ORAL_TABLET | Freq: Every evening | ORAL | Status: DC | PRN

## 2015-10-31 MED ORDER — FENTANYL CITRATE (PF) 100 MCG/2ML IJ SOLN
25.0000 ug | INTRAMUSCULAR | Status: DC | PRN
  Administered 2015-10-31 (×2): 50 ug via INTRAVENOUS
  Filled 2015-10-31: qty 1

## 2015-10-31 MED ORDER — ZOLPIDEM TARTRATE 5 MG PO TABS: 5.0000 mg | ORAL_TABLET | Freq: Every evening | ORAL | Status: DC | PRN

## 2015-10-31 MED ORDER — HYDROCODONE-ACETAMINOPHEN 5-325 MG PO TABS
1.0000 | ORAL_TABLET | ORAL | Status: DC | PRN
  Administered 2015-11-01 – 2015-11-02 (×3): 1 via ORAL
  Administered 2015-11-03: 2 via ORAL
  Filled 2015-10-31 (×3): qty 1
  Filled 2015-10-31: qty 2

## 2015-10-31 MED ORDER — ONDANSETRON HCL 4 MG/2ML IJ SOLN
4.0000 mg | INTRAMUSCULAR | Status: DC | PRN
  Administered 2015-11-01: 4 mg via INTRAVENOUS
  Filled 2015-10-31: qty 2

## 2015-10-31 MED ORDER — FLUOXETINE HCL 10 MG PO CAPS
10.0000 mg | ORAL_CAPSULE | Freq: Every day | ORAL | Status: DC
  Administered 2015-10-31 – 2015-11-03 (×4): 10 mg via ORAL
  Filled 2015-10-31 (×4): qty 1

## 2015-10-31 MED ORDER — FENTANYL CITRATE (PF) 100 MCG/2ML IJ SOLN
INTRAMUSCULAR | Status: AC
  Filled 2015-10-31: qty 2

## 2015-10-31 MED ORDER — PHENAZOPYRIDINE HCL 200 MG PO TABS
200.0000 mg | ORAL_TABLET | Freq: Three times a day (TID) | ORAL | Status: DC | PRN
  Filled 2015-10-31: qty 1

## 2015-10-31 MED ORDER — HYOSCYAMINE SULFATE 0.125 MG SL SUBL
0.1250 mg | SUBLINGUAL_TABLET | SUBLINGUAL | Status: DC | PRN
  Filled 2015-10-31: qty 1

## 2015-10-31 MED ORDER — HYDROMORPHONE HCL 1 MG/ML IJ SOLN
0.5000 mg | INTRAMUSCULAR | Status: DC | PRN
  Administered 2015-11-01 – 2015-11-03 (×4): 1 mg via INTRAVENOUS
  Filled 2015-10-31 (×4): qty 1

## 2015-10-31 MED ORDER — FENTANYL CITRATE (PF) 100 MCG/2ML IJ SOLN
INTRAMUSCULAR | Status: DC | PRN
  Administered 2015-10-31 (×2): 25 ug via INTRAVENOUS
  Administered 2015-10-31: 50 ug via INTRAVENOUS

## 2015-10-31 MED ORDER — SODIUM CHLORIDE 0.9 % IV SOLN
250.0000 mL | INTRAVENOUS | Status: DC | PRN
  Filled 2015-10-31: qty 250

## 2015-10-31 MED ORDER — ACETAMINOPHEN 325 MG PO TABS: 650.0000 mg | ORAL_TABLET | ORAL | Status: DC | PRN

## 2015-10-31 MED ORDER — ACETAMINOPHEN 325 MG PO TABS
650.0000 mg | ORAL_TABLET | ORAL | Status: DC | PRN
  Filled 2015-10-31: qty 2

## 2015-10-31 MED ORDER — INFLUENZA VAC SPLIT QUAD 0.5 ML IM SUSY
0.5000 mL | PREFILLED_SYRINGE | INTRAMUSCULAR | Status: AC
  Administered 2015-11-01: 0.5 mL via INTRAMUSCULAR
  Filled 2015-10-31: qty 0.5

## 2015-10-31 MED ORDER — ONDANSETRON HCL 4 MG/2ML IJ SOLN
INTRAMUSCULAR | Status: DC | PRN
  Administered 2015-10-31: 4 mg via INTRAVENOUS

## 2015-10-31 MED ORDER — MIDAZOLAM HCL 2 MG/2ML IJ SOLN
INTRAMUSCULAR | Status: DC | PRN
  Administered 2015-10-31: 2 mg via INTRAVENOUS

## 2015-10-31 MED ORDER — ALPRAZOLAM 0.25 MG PO TABS
0.2500 mg | ORAL_TABLET | Freq: Three times a day (TID) | ORAL | Status: DC
  Administered 2015-10-31 – 2015-11-04 (×11): 0.25 mg via ORAL
  Filled 2015-10-31 (×11): qty 1

## 2015-10-31 MED ORDER — HYDROCODONE-ACETAMINOPHEN 5-325 MG PO TABS
1.0000 | ORAL_TABLET | Freq: Four times a day (QID) | ORAL | 0 refills | Status: DC | PRN
Start: 2015-10-31 — End: 2016-04-27

## 2015-10-31 MED ORDER — FENTANYL CITRATE (PF) 100 MCG/2ML IJ SOLN
25.0000 ug | INTRAMUSCULAR | Status: DC | PRN
  Administered 2015-10-31 – 2015-11-01 (×3): 50 ug via INTRAVENOUS
  Filled 2015-10-31: qty 2
  Filled 2015-10-31: qty 1
  Filled 2015-10-31 (×2): qty 2

## 2015-10-31 MED ORDER — SODIUM CHLORIDE 0.9 % IR SOLN
Status: DC | PRN
  Administered 2015-10-31: 6000 mL

## 2015-10-31 MED ORDER — PROMETHAZINE HCL 25 MG/ML IJ SOLN
6.2500 mg | INTRAMUSCULAR | Status: DC | PRN
  Filled 2015-10-31: qty 1

## 2015-10-31 SURGICAL SUPPLY — 36 items
BAG DRAIN URO-CYSTO SKYTR STRL (DRAIN) ×4 IMPLANT
BAG URINE DRAINAGE (UROLOGICAL SUPPLIES) ×4 IMPLANT
BAG URINE LEG 19OZ MD ST LTX (BAG) IMPLANT
CATH FOLEY 2WAY SLVR  5CC 20FR (CATHETERS) ×2
CATH FOLEY 2WAY SLVR  5CC 22FR (CATHETERS)
CATH FOLEY 2WAY SLVR 5CC 20FR (CATHETERS) ×2 IMPLANT
CATH FOLEY 2WAY SLVR 5CC 22FR (CATHETERS) IMPLANT
CATH URET 5FR 28IN CONE TIP (BALLOONS)
CATH URET 5FR 28IN OPEN ENDED (CATHETERS) ×4 IMPLANT
CATH URET 5FR 70CM CONE TIP (BALLOONS) IMPLANT
CLOTH BEACON ORANGE TIMEOUT ST (SAFETY) ×4 IMPLANT
ELECT REM PT RETURN 9FT ADLT (ELECTROSURGICAL) ×4
ELECTRODE REM PT RTRN 9FT ADLT (ELECTROSURGICAL) ×2 IMPLANT
GLOVE BIOGEL PI IND STRL 7.0 (GLOVE) ×4 IMPLANT
GLOVE BIOGEL PI INDICATOR 7.0 (GLOVE) ×4
GLOVE ECLIPSE 7.0 STRL STRAW (GLOVE) ×4 IMPLANT
GLOVE SURG SS PI 8.0 STRL IVOR (GLOVE) ×4 IMPLANT
GOWN SPEC L3 XXLG W/TWL (GOWN DISPOSABLE) ×4 IMPLANT
GOWN STRL REUS W/ TWL LRG LVL3 (GOWN DISPOSABLE) ×2 IMPLANT
GOWN STRL REUS W/ TWL XL LVL3 (GOWN DISPOSABLE) ×2 IMPLANT
GOWN STRL REUS W/TWL LRG LVL3 (GOWN DISPOSABLE) ×2
GOWN STRL REUS W/TWL XL LVL3 (GOWN DISPOSABLE) ×2
GUIDEWIRE 0.038 PTFE COATED (WIRE) IMPLANT
GUIDEWIRE ANG ZIPWIRE 038X150 (WIRE) IMPLANT
GUIDEWIRE STR DUAL SENSOR (WIRE) ×4 IMPLANT
HOLDER FOLEY CATH W/STRAP (MISCELLANEOUS) ×4 IMPLANT
KIT ROOM TURNOVER WOR (KITS) ×4 IMPLANT
LOOP CUT BIPOLAR 24F LRG (ELECTROSURGICAL) ×4 IMPLANT
MANIFOLD NEPTUNE II (INSTRUMENTS) IMPLANT
NS IRRIG 500ML POUR BTL (IV SOLUTION) IMPLANT
PACK CYSTO (CUSTOM PROCEDURE TRAY) ×4 IMPLANT
PLUG CATH AND CAP STER (CATHETERS) IMPLANT
SET ASPIRATION TUBING (TUBING) IMPLANT
SYRINGE IRR TOOMEY STRL 70CC (SYRINGE) IMPLANT
TUBE CONNECTING 12'X1/4 (SUCTIONS) ×1
TUBE CONNECTING 12X1/4 (SUCTIONS) ×3 IMPLANT

## 2015-10-31 NOTE — Significant Event (Deleted)
Rapid Response Event Note  Overview: Time Called: 2130 Arrival Time: 2135 Event Type: Other (Comment) (Pt bleeding from catheter)  Event Summary:  Called regarding pt bleeding from catheter with multiple hand irrigations done.  Upon arrival primary RN irrigating foley catheter.  Pt lying in bed, hurting with irrigation and also c/o lower mid back pain.  VS stable - BP 113/75 with HR 75, RR - 20-22 with Sats 95 on room air.  No current distress noted.  Pt admitted with Bladder CA and had a TRANSURETHRAL RESECTION OF BLADDER TUMOR earlier today by Dr. Jeffie Pollock.  Last Hgb on chart was 13.6 at 1017 this am.  Primary RN advised that foley had been changed to a bigger size, but that she was advised in report that Dr. Jeffie Pollock didn't want CBIs started.  I bladderscanned the pt with results ranging from 30 to 42ml.  Dr. Karsten Ro was paged by primary RN.  I was not privileged to the conversation but from what was reported to me after the call was Dr. Karsten Ro at first wanted CBIs started then primary RN advised Dr. Karsten Ro about what Dr. Jeffie Pollock had said earlier.  Dr. Karsten Ro replied with hand irrigate then.  Primary RN asked for transfer to SDU, due to added acuity of hand irrigations would add to her assignment.  At the present time I did not see any negative trend in condition that would warrant a need to transfer pt to SDU.  Dr. Karsten Ro paged back.  Primary nurse talked with him first then I was able to discuss situation with him, he advised to start CBIs and currently keep pt on 4th floor, no need to transfer at present time.  I also did advise Dr. Karsten Ro of pt's back pain which has been going on for a few hours and he advised this is not related to her surgery.  No new orders given to me, but orders given to primary RN, which she is entering now.  Interventions:  Bladder Irrigation (which was being on on my arrival), VS, Bladderscan, start CBIs, recheck H and H  Plan of Care (if not transferred):  If any change  in condition or signs of hypovolemia (tachycardia, hypotension,etc.) or distress, please call Rapid Response Nurse back to room.  Event End Time:  2150    Nance Pew ICU/SD Care Coordinator / Rapid Response RN

## 2015-10-31 NOTE — Discharge Instructions (Addendum)
CYSTOSCOPY HOME CARE INSTRUCTIONS  Activity: Rest for the remainder of the day.  Do not drive or operate equipment today.  You may resume normal activities in one to two days as instructed by your physician.   Meals: Drink plenty of liquids and eat light foods such as gelatin or soup this evening.  You may return to a normal meal plan tomorrow.  Return to Work: You may return to work in one to two days or as instructed by your physician.  Special Instructions / Symptoms: Call your physician if any of these symptoms occur:   -persistent or heavy bleeding  -bleeding which continues after first few urination  -large blood clots that are difficult to pass  -urine stream diminishes or stops completely  -fever equal to or higher than 101 degrees Farenheit.  -cloudy urine with a strong, foul odor  -severe pain  Females should always wipe from front to back after elimination.  You may feel some burning pain when you urinate.  This should disappear with time.  Applying moist heat to the lower abdomen or a hot tub bath may help relieve the pain. \  You may remove the catheter in the morning.   Patient Signature:  ________________________________________________________  Nurse's Signature:  ________________________________________________________  Post Anesthesia Home Care Instructions  Activity: Get plenty of rest for the remainder of the day. A responsible adult should stay with you for 24 hours following the procedure.  For the next 24 hours, DO NOT: -Drive a car -Paediatric nurse -Drink alcoholic beverages -Take any medication unless instructed by your physician -Make any legal decisions or sign important papers.  Meals: Start with liquid foods such as gelatin or soup. Progress to regular foods as tolerated. Avoid greasy, spicy, heavy foods. If nausea and/or vomiting occur, drink only clear liquids until the nausea and/or vomiting subsides. Call your physician if vomiting  continues.  Special Instructions/Symptoms: Your throat may feel dry or sore from the anesthesia or the breathing tube placed in your throat during surgery. If this causes discomfort, gargle with warm salt water. The discomfort should disappear within 24 hours.  If you had a scopolamine patch placed behind your ear for the management of post- operative nausea and/or vomiting:  1. The medication in the patch is effective for 72 hours, after which it should be removed.  Wrap patch in a tissue and discard in the trash. Wash hands thoroughly with soap and water. 2. You may remove the patch earlier than 72 hours if you experience unpleasant side effects which may include dry mouth, dizziness or visual disturbances. 3. Avoid touching the patch. Wash your hands with soap and water after contact with the patch.

## 2015-10-31 NOTE — H&P (View-Only) (Signed)
CC: I have blood in my urine.  HPI: Sandra [name] is a 40 year-old female patient who is here for blood in the urine.  She did see the blood in her urine. She first noticed the symptoms approximately 03/13/2015. She has seen blood clots.   She does not have a burning sensation when she urinates. She is not currently having trouble urinating.   Sandra [name] is a 40 yo WF with a 5-6 month history of intermittent hematuria with clots. She bled most of the week with clots but it has stopped now. She will bleed for 4-5 days every couple of weeks. She has some suprapubic pain but no flank pain. She has no dysuria. She has mild SUI. She has some frequency. She has some nocturia. She has some intermittency. She has no aggravating factors other than possibly heavy activity. She has had no issues with UTI's. She was told she had a stone about a year ago. She has had no GU surgery. She is no on blood thinners. she is a smoker. She had a hysterectomy for ovarian cancer but had no radiation or chemo.      ALLERGIES: Benadryl Codeine Flagyl Penicillin    MEDICATIONS: Cetirizine Hcl  Equetro 300 mg capsule,extended release multiphase 12hr  Fluoxetine Hcl 10 mg capsule  Lovastatin 40 mg tablet  Montelukast Sodium 10 mg tablet  Pantoprazole Sodium 40 mg tablet, delayed release     GU PSH: Hysterectomy    NON-GU PSH: Tonsillectomy    GU PMH: None     PMH Notes: ovarian cancer   NON-GU PMH: Anxiety disorder due to known physiological condition Depression Duodenal ulcer, unspecified as acute or chronic, without hemorrhage or perforation Gastro-esophageal reflux disease without esophagitis Type 2 diabetes mellitus without complications    FAMILY HISTORY: nephrolithiasis - Runs in Family   SOCIAL HISTORY: Marital Status: Married Current Smoking Status: Patient smokes occasionally. Has smoked since 09/12/2000.  Has never drank.  Drinks 1 caffeinated drink per day. 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     Notes: 1 daughter   REVIEW OF SYSTEMS:    GU Review Female:   Patient reports frequent urination, get up at night to urinate, leakage of urine, and stream starts and stops. Patient denies hard to postpone urination, burning /pain with urination, trouble starting your stream, have to strain to urinate, and currently pregnant.  Gastrointestinal (Upper):   Patient reports indigestion/ heartburn. Patient denies nausea and vomiting.  Gastrointestinal (Lower):   Patient denies diarrhea and constipation.  Constitutional:   Patient denies fever, night sweats, weight loss, and fatigue.  Skin:   Patient denies skin rash/ lesion and itching.  Eyes:   Patient denies blurred vision and double vision.  Ears/ Nose/ Throat:   Patient reports sinus problems. Patient denies sore throat.  Hematologic/Lymphatic:   Patient denies swollen glands and easy bruising.  Cardiovascular:   Patient denies leg swelling and chest pains.  Respiratory:   Patient denies cough and shortness of breath.  Endocrine:   Patient denies excessive thirst.  Musculoskeletal:   Patient reports back pain. Patient denies joint pain.  Neurological:   Patient reports headaches. Patient denies dizziness.  Psychologic:   Patient reports depression and anxiety.    VITAL SIGNS:      09/20/2015 01:46 PM  Weight 199 lb / 90.26 kg  Height 51 in / 129.54 cm  BP 159/109 mmHg  Pulse 109 /min  Temperature 98.3 F / 37 C  BMI 53.8 kg/m   GU PHYSICAL  EXAMINATION:    External Genitalia: No hirsutism, no rash, no scarring, no cyst, no erythematous lesion, no papular lesion, no blanched lesion, no warty lesion. No edema.  Urethral Meatus: Normal size. Normal position. No discharge.  Urethra: No tenderness, no mass, no scarring. No hypermobility. No leakage.  Bladder: Normal to palpation, no tenderness, no mass, normal size.  Vagina: No atrophy, no stenosis. No rectocele. No cystocele. No enterocele.  Cervix: S/P Hysterectomy   Uterus: S/P Hysterectomy  Adnexa / Parametria: No tenderness. No adnexal mass. Normal left ovary. Normal right ovary.  Anus and Perineum: No hemorrhoids. No anal stenosis. No rectal fissure, no anal fissure. No edema, no dimple, no perineal tenderness, no anal tenderness.   MULTI-SYSTEM PHYSICAL EXAMINATION:    Constitutional: Well-nourished. No physical deformities. Normally developed. Good grooming.  Neck: Neck symmetrical, not swollen. Normal tracheal position.  Respiratory: No labored breathing, no use of accessory muscles. CTA  Cardiovascular: Normal temperature, normal extremity pulses, no swelling, no varicosities. RRR without murmur.   Lymphatic: No enlargement of neck, axillae, groin.  Skin: No paleness, no jaundice, no cyanosis. No lesion, no ulcer, no rash.  Neurologic / Psychiatric: Oriented to time, oriented to place, oriented to person. No depression, no anxiety, no agitation.  Gastrointestinal: Obese abdomen. No hernia. No mass, no tenderness, no rigidity.   Musculoskeletal: Normal gait and station of head and neck.     PAST DATA REVIEWED:  Source Of History:  Patient  Records Review:   Previous Doctor Records  Urine Test Review:   Urinalysis  X-Ray Review: C.T. Abdomen/Pelvis: Reviewed Films. Reviewed Report. CT's from prior to 2013 reviewed and no GU findings noted.    Notes:                     Notes and labs from Burman Freestone NP with [facility].    PROCEDURES:         Flexible Cystoscopy - 52000  Risks, benefits, and some of the potential complications of the procedure were discussed.  Meatus:  Normal size. Normal location. Normal condition.  Urethra:  No hypermobility. No leakage.  Ureteral Orifices:  Normal location. Normal size. Normal shape. Effluxed clear urine.  Bladder:  She has a 2cm papillary tumor obscuring the right UO and 2 small tumors on the floor of the bladder and one at the right BN.       Multifocal bladder tumors with a 2cm  lesion at the right UO.  The procedure was well tolerated and there were no complications.         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 Abdomen/Pelvis With and Without I.V. Contrast - asap  Return Visit: Next Available Appointment - Schedule Surgery  Procedure: 09/20/2015 at [facility] [phone] - Flexible Cystoscopy (Cystoscopy) - 52000  Document Letter(s):  Created for Patient: Clinical Summary         Notes:   She needs a CT hematuria study and then will need to be set up for a TURBT with instillation of Epirubicin.   I have reviewed the risks of the procedure including bleeding,infection, bladder or ureteral injury, need for secondary procedures and subsequent BCG, chemical cystitis, thrombotic events and anesthetic complications.   CC: [facility]

## 2015-10-31 NOTE — Progress Notes (Signed)
Patient ID: Sandra [name], female   DOB: 10-08-1975, 40 y.o.   MRN: [id number]  She was clear after cystoscopy but has had bleeding since arriving in PACU that has required irrigation.  I have admitted her for evaluation and the bleeding is clearing.   She has some right hip pain but no real flank pain and has no CVAT.  If the pain persists, she will need a renal US because of the need to resect the UO and my inability to cannulate the UO for a stent.  Hopefully she will be able to go home tomorrow without the foley.  She was given a script for hydrocodone in PACU.

## 2015-10-31 NOTE — Interval H&P Note (Signed)
History and Physical Interval Note:  10/31/2015 10:14 AM  Sandra [name]  has presented today for surgery, with the diagnosis of bladder tumor  The various methods of treatment have been discussed with the patient and family. After consideration of risks, benefits and other options for treatment, the patient has consented to  Procedure(s): re-staging TRANSURETHRAL RESECTION OF BLADDER TUMOR (TURBT) (N/A) CYSTOSCOPY WITH possible right ureteral STENT PLACEMENT (Right) as a surgical intervention .  The patient's history has been reviewed, patient examined, no change in status, stable for surgery.  I have reviewed the patient's chart and labs.  Questions were answered to the patient's satisfaction.     Berneita Sanagustin J

## 2015-10-31 NOTE — Brief Op Note (Signed)
10/31/2015  11:19 AM  PATIENT:  Sandra [name]  40 y.o. female  PRE-OPERATIVE DIAGNOSIS:  bladder tumor 2cm Right trigone  POST-OPERATIVE DIAGNOSIS:  bladder tumor 2cm right trigone  PROCEDURE:  Procedure(s): re-staging TRANSURETHRAL RESECTION OF BLADDER TUMOR (TURBT) (N/A) CYSTOSCOPY WITH ATTEMPTED RIGHT URETERAL CATHETERIZATION(Right)  SURGEON:  Surgeon(s) and Role:    * Irine Seal, MD - Primary  PHYSICIAN ASSISTANT:   ASSISTANTS: none   ANESTHESIA:   general  EBL:  Total I/O In: 700 [I.V.:700] Out: 0   BLOOD ADMINISTERED:none  DRAINS: Urinary Catheter (Foley)   LOCAL MEDICATIONS USED:  NONE  SPECIMEN:  Source of Specimen:  BLADDER TUMOR BED  DISPOSITION OF SPECIMEN:  PATHOLOGY  COUNTS:  YES  TOURNIQUET:  * No tourniquets in log *  DICTATION: .Other Dictation: Dictation Number [id number]  PLAN OF CARE: Discharge to home after PACU  PATIENT DISPOSITION:  PACU - hemodynamically stable.   Delay start of Pharmacological VTE agent (>24hrs) due to surgical blood loss or risk of bleeding: not applicable

## 2015-10-31 NOTE — Anesthesia Procedure Notes (Signed)
Procedure Name: LMA Insertion Date/Time: 10/31/2015 10:36 AM Performed by: Cynda Familia Pre-anesthesia Checklist: Patient identified, Emergency Drugs available, Suction available and Patient being monitored Patient Re-evaluated:Patient Re-evaluated prior to inductionOxygen Delivery Method: Circle System Utilized Preoxygenation: Pre-oxygenation with 100% oxygen Intubation Type: IV induction Ventilation: Mask ventilation without difficulty LMA: LMA inserted LMA Size: 4.0 Number of attempts: 1 Placement Confirmation: positive ETCO2 Tube secured with: Tape Dental Injury: Teeth and Oropharynx as per pre-operative assessment  Comments: Smooth IV induction Ola Spurr --- LMA AM CRNA atraumatic--mouth as preop----bilat BS Ola Spurr

## 2015-10-31 NOTE — Progress Notes (Signed)
1700-1855Patient arrival on the unit, pt has required intermittent bladder irrigation to remove blood clots from bladder. Multiple small blood clots have been irrigated from bladder. Pt has relief temporary after irrigation. Dr. Jeffie Pollock called to update concerning the intermittent irrigations. Orders received. Pain given to pt for discomfort. Pt able to rest after pain med administered. Pt was handed irrigated with multiple small clots noted from bladders. Family present at the bedside with patient. Orders received to change out the catheter if needed. Pt resting at the present time. Will continue to monitor. SRP, RN

## 2015-10-31 NOTE — Transfer of Care (Signed)
Immediate Anesthesia Transfer of Care Note  Patient: Sandra [name]  Procedure(s) Performed: Procedure(s): RE-STAGINGG TRANSURETHRAL RESECTION OF BLADDER TUMOR (TURBT) (N/A) CYSTOSCOPY WITH ATTEMPTED RIGHT URETERAL OPENING (Right)  Patient Location: PACU  Anesthesia Type:General  Level of Consciousness: sedated  Airway & Oxygen Therapy: Patient Spontanous Breathing and Patient connected to nasal cannula oxygen  Post-op Assessment: Report given to RN and Post -op Vital signs reviewed and stable  Post vital signs: Reviewed and stable  Last Vitals:  Vitals:   10/31/15 0916 10/31/15 1133  BP: (!) 141/79 129/88  Pulse: 66 76  Resp: (!) 22 (!) 22  Temp: 36.7 C 36.8 C    Last Pain:  Vitals:   10/31/15 0955  TempSrc:   PainSc: 6       Patients Stated Pain Goal: 2 ([id number])  Complications: No apparent anesthesia complications

## 2015-10-31 NOTE — Anesthesia Preprocedure Evaluation (Signed)
Anesthesia Evaluation  Patient identified by MRN, date of birth, ID band Patient awake    Reviewed: Allergy & Precautions, NPO status , Patient's Chart, lab work & pertinent test results  Airway Mallampati: II  TM Distance: >3 FB Neck ROM: Full    Dental  (+) Edentulous Lower, Edentulous Upper   Pulmonary Current Smoker, former smoker,    Pulmonary exam normal breath sounds clear to auscultation       Cardiovascular negative cardio ROS Normal cardiovascular exam Rhythm:Regular Rate:Normal     Neuro/Psych PSYCHIATRIC DISORDERS  Neuromuscular disease    GI/Hepatic Neg liver ROS, GERD  Medicated,  Endo/Other  diabetes, Type 2, Oral Hypoglycemic Agents  Renal/GU negative Renal ROS  negative genitourinary   Musculoskeletal negative musculoskeletal ROS (+)   Abdominal (+) + obese,   Peds negative pediatric ROS (+)  Hematology  (+) anemia ,   Anesthesia Other Findings   Reproductive/Obstetrics negative OB ROS                             Lab Results  Component Value Date   WBC 9.5 06/16/2015   HGB 13.9 10/03/2015   HCT 41.0 10/03/2015   MCV 84.0 06/16/2015   PLT 313 06/16/2015   Lab Results  Component Value Date   CREATININE 0.83 06/16/2015   BUN 10 06/16/2015   NA 138 10/03/2015   K 3.9 10/03/2015   CL 101 06/16/2015   CO2 26 06/16/2015    Anesthesia Physical  Anesthesia Plan  ASA: III  Anesthesia Plan: General   Post-op Pain Management:    Induction: Intravenous  Airway Management Planned: LMA  Additional Equipment:   Intra-op Plan:   Post-operative Plan: Extubation in OR  Informed Consent: I have reviewed the patients History and Physical, chart, labs and discussed the procedure including the risks, benefits and alternatives for the proposed anesthesia with the patient or authorized representative who has indicated his/her understanding and acceptance.   Dental  advisory given  Plan Discussed with: CRNA  Anesthesia Plan Comments:         Anesthesia Quick Evaluation

## 2015-10-31 NOTE — Significant Event (Signed)
Rapid Response Event Note  Overview: Time Called: 2130 Arrival Time: 2135 Event Type: Other (Comment) (Pt bleeding from catheter)  Initial Focused Assessment:  Called regarding pt bleeding from catheter with multiple hand irrigations done.  Upon arrival primary RN irrigating foley catheter.  Pt lying in bed, hurting with irrigation and also c/o lower mid back pain.  VS stable - BP 113/75 with HR 75, RR - 20-22 with Sats 95 on room air.  No current distress noted.  Pt admitted with Bladder CA and had a TRANSURETHRAL RESECTION OF BLADDER TUMOR earlier today by Dr. Jeffie Pollock.  Last Hgb on chart was 13.6 at 1017 this am.  Primary RN advised that foley had been changed to a bigger size, but that she was advised in report that Dr. Jeffie Pollock didn't want CBIs started.  I bladderscanned the pt with results ranging from 30 to 56ml.  Dr. Karsten Ro was paged by primary RN.  I was not privileged to the conversation but from what was reported to me after the call was Dr. Karsten Ro at first wanted CBIs started then primary RN advised Dr. Karsten Ro about what Dr. Jeffie Pollock had said earlier.  Dr. Karsten Ro replied with hand irrigate then.  Primary RN asked for transfer to SDU, due to added acuity of hand irrigations would add to her assignment.  At the present time I did not see any negative trend in condition that would warrant a need to transfer pt to SDU.  Dr. Karsten Ro paged back.  Primary nurse talked with him first then I was able to discuss situation with him, he advised to start CBIs and currently keep pt on 4th floor, no need to transfer at present time.  I also did advise Dr. Karsten Ro of pt's back pain which has been going on for a few hours and he advised this is not related to her surgery.  No new orders given to me, but orders given to primary RN, which she is entering now.  Interventions:  Bladder Irrigation (which was being on on my arrival), VS, Bladderscan, start CBIs, recheck H and H  Plan of Care (if not  transferred):  If any change in condition or signs of hypovolemia (tachycardia, hypotension,etc.) or distress, please call Rapid Response Nurse back to room.  Event Summary:  Name of Physician Notified: Dr, Karsten Ro at 2145  Outcome: Stayed in room and stabalized, Other (Comment) (Dr. Karsten Ro advised pt did not need to transfer at this time)  Event End Time: 2150  Nance Pew ICU/SD Care Coordinator / Rapid Response RN

## 2015-11-01 ENCOUNTER — Observation Stay (HOSPITAL_COMMUNITY): Payer: Medicaid Other

## 2015-11-01 ENCOUNTER — Encounter (HOSPITAL_BASED_OUTPATIENT_CLINIC_OR_DEPARTMENT_OTHER): Payer: Self-pay | Admitting: Urology

## 2015-11-01 DIAGNOSIS — D62 Acute posthemorrhagic anemia: Secondary | ICD-10-CM | POA: Diagnosis not present

## 2015-11-01 DIAGNOSIS — Z880 Allergy status to penicillin: Secondary | ICD-10-CM | POA: Diagnosis not present

## 2015-11-01 DIAGNOSIS — Z886 Allergy status to analgesic agent status: Secondary | ICD-10-CM | POA: Diagnosis not present

## 2015-11-01 DIAGNOSIS — F17200 Nicotine dependence, unspecified, uncomplicated: Secondary | ICD-10-CM | POA: Diagnosis present

## 2015-11-01 DIAGNOSIS — R310 Gross hematuria: Secondary | ICD-10-CM | POA: Diagnosis present

## 2015-11-01 DIAGNOSIS — Z8543 Personal history of malignant neoplasm of ovary: Secondary | ICD-10-CM | POA: Diagnosis not present

## 2015-11-01 DIAGNOSIS — K219 Gastro-esophageal reflux disease without esophagitis: Secondary | ICD-10-CM | POA: Diagnosis present

## 2015-11-01 DIAGNOSIS — Z90710 Acquired absence of both cervix and uterus: Secondary | ICD-10-CM | POA: Diagnosis not present

## 2015-11-01 DIAGNOSIS — Z888 Allergy status to other drugs, medicaments and biological substances status: Secondary | ICD-10-CM | POA: Diagnosis not present

## 2015-11-01 DIAGNOSIS — E119 Type 2 diabetes mellitus without complications: Secondary | ICD-10-CM | POA: Diagnosis present

## 2015-11-01 DIAGNOSIS — C670 Malignant neoplasm of trigone of bladder: Secondary | ICD-10-CM | POA: Diagnosis present

## 2015-11-01 LAB — GLUCOSE, CAPILLARY
GLUCOSE-CAPILLARY: 236 mg/dL — AB (ref 65–99)
GLUCOSE-CAPILLARY: 243 mg/dL — AB (ref 65–99)
GLUCOSE-CAPILLARY: 141 mg/dL — AB (ref 65–99)
Glucose-Capillary: 134 mg/dL — ABNORMAL HIGH (ref 65–99)
Glucose-Capillary: 138 mg/dL — ABNORMAL HIGH (ref 65–99)

## 2015-11-01 LAB — HEMOGLOBIN AND HEMATOCRIT, BLOOD
HEMATOCRIT: 32.7 % — AB (ref 36.0–46.0)
HEMOGLOBIN: 10.2 g/dL — AB (ref 12.0–15.0)

## 2015-11-01 MED ORDER — SODIUM CHLORIDE 0.9 % IV SOLN
INTRAVENOUS | Status: DC
  Administered 2015-11-01 – 2015-11-03 (×3): via INTRAVENOUS

## 2015-11-01 NOTE — Progress Notes (Signed)
1 Day Post-Op Subjective: Patient has had persistent hematuria-fair amount of clot last night.  Catheter was changed to 26 [place].  Nurses have been diligently irrigating the patient's catheter.  No recent clots.  Objective: Vital signs in last 24 hours: Temp:  [98 F (36.7 C)-98.7 F (37.1 C)] 98.7 F (37.1 C) (10/20 0555) Pulse Rate:  [58-85] 85 (10/20 0555) Resp:  [14-22] 16 (10/20 0555) BP: (106-149)/(66-96) 106/66 (10/20 0555) SpO2:  [92 %-100 %] 92 % (10/20 0555) Weight:  [91.6 kg (202 lb)] 91.6 kg (202 lb) (10/19 0916)  Intake/Output from previous day: 10/19 0701 - 10/20 0700 In: 3705 [P.O.:1260; I.V.:2445] Out: 2905 [Urine:2905] Intake/Output this shift: No intake/output data recorded.  Physical Exam:  Constitutional: Vital signs reviewed. WD WN in NAD   Eyes: PERRL, No scleral icterus.   Pulmonary/Chest: Normal effort  Lab Results:  Recent Labs  10/31/15 1017 10/31/15 2308 11/01/15 0412  HGB 13.6 10.4* 10.2*  HCT 40.0 33.2* 32.7*   BMET  Recent Labs  10/31/15 1017  NA 137  K 4.8  GLUCOSE 139*   No results for input(s): LABPT, INR in the last 72 hours. No results for input(s): LABURIN in the last 72 hours. Results for orders placed or performed during the hospital encounter of 10/25/15  Culture, Urine     Status: Abnormal   Collection Time: 10/25/15 10:15 AM  Result Value Ref Range Status   Specimen Description URINE, RANDOM  Final   Special Requests NONE  Final   Culture MULTIPLE SPECIES PRESENT, SUGGEST RECOLLECTION (A)  Final   Report Status 10/27/2015 FINAL  Final   I personally irrigated the patient's catheter with 500 mL of saline.  A few small clots came out.  No large clots, and it irrigates until clear/pink. Studies/Results: No results found.  Assessment/Plan:   Postoperative day #1, TURBT.  I do not think the patient is ready to go home yet-I will saline lock her IV, we will continue regular catheter irrigations.  Hopefully, by  tomorrow, she will be able to have a catheter removed for voiding trial and discharge.   LOS: 0 days   Franchot Gallo M 11/01/2015, 7:55 AM

## 2015-11-01 NOTE — Op Note (Signed)
Sandra [name], Sandra [name]               ACCOUNT NO.:  [id number]  MEDICAL RECORD NO.:  [id number]  LOCATION:  1430                         FACILITY:  [facility]  PHYSICIAN:  Marshall Cork. Jeffie Pollock, M.D.    DATE OF BIRTH:  [birth year]  DATE OF PROCEDURE:  10/31/2015 DATE OF DISCHARGE:                              OPERATIVE REPORT   PROCEDURE:  Cystoscopy with re-resection of 2-cm bladder tumor from the right trigone and attempted right ureteral catheterization.  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  SURGEON:  Marshall Cork. Jeffie Pollock, M.D.  ANESTHESIA:  General.  SPECIMEN:  Tissue fragments from the tumor bed.  DRAINS:  20-French Foley catheter.  BLOOD LOSS:  Minimal.  COMPLICATIONS:  None.  INDICATIONS:  Sandra [name] is a 40 year old white female who earlier this month underwent resection of a 2-cm tumor from the right trigone and fulguration with smaller satellite tumors within the bladder.  At the time of resection, the ureteral orifice was close to, but not involved with the tumor or the resection, so the stent was not left.  Her final pathology came back high-grade urothelial carcinoma with invasion, but no muscle in the specimen.  She returns now for restaging TURBT and stent placement if needed.  FINDINGS OF PROCEDURE:  She was taken to the operating room where she was given Ancef.  A general anesthetic was induced.  She was placed in lithotomy position.  Her perineum and genitalia were prepped with Betadine solution and she was draped in usual sterile fashion. Cystoscopy was performed using the 23-French scope and 30-degree lens. Examination revealed the normal urethra.  The areas of prior resection were noted to have dystrophic calcification.   The left ureteral orifice was unremarkable.  Inspection of the right trigone with both of the 30 and 70-degree lenses revealed no obvious right ureteral orifice. Attempts at cannulation of couple of small dimples adjacent to the prior resection bed were unsuccessful.  At this point, the cystoscope was removed and the resectoscope was placed.  The resection bed of the prior 2-cm tumor was then re-resected down into muscle until fat could be seen.  The dystrophic calcification was all resected away as well.  Once I felt that had an adequate specimen, the chips were evacuated from the bladder.  I then inspected the tumor bed resection bed for evidence of a ureteral jet and eventually after prolonged monitoring, there was a jet noted from the middle of the resection bed.  The transected ureter was identified within this area of the jet.  However, because the angle of the ureter, I was unable to cannulate it either with a 30-degree using a 5-French open and a Sensor wire or the 70-degree using an aberrance bridge.  At this point, since it was effluxing and it appeared to have a clean cut, it was felt that further efforts would be not of value in attempting to stent this orifice.  At this point, once hemostasis was achieved, the bladder was inspected. No  residual chips were noted.  The scope was removed and a 20-French Foley catheter was inserted.  The balloon was filled with 10 mL of sterile fluid.  The catheter was placed to straight drainage.  The specimen was collected and to be sent for pathology.  There were no complications.     Marshall Cork. Jeffie Pollock, M.D.     JJW/MEDQ  D:  10/31/2015  T:  11/01/2015  Job:  [id number]

## 2015-11-01 NOTE — Progress Notes (Signed)
This RN called into pt's room regarding pt's back pain and pt feeling "chills." RN noticed foley catheter was not flowing, so this RN assisted pt back to bed and hand irrigated foley with large amount of blood clots returned.  Pt continued to c/o extreme pain to lower abdomen and back. Pain med given per orders. Foley catheter replaced with 22 Fr. catheter per orders. Dark red urine flowed out of foley catheter after.   Pt then c/o sensation of feeling like she was going to "pass out", RRT called, and MD notified of massive amounts of clots returned from bladder.

## 2015-11-01 NOTE — Progress Notes (Signed)
Patient ID: Sandra [name], female   DOB: 02-21-1975, 40 y.o.   MRN: [id number]  She is having right flank pain with nausea and vomiting along with persistent hematuria but he urine looks dark which is more consistent with old blood than fresh blood.  She has CVAT on the right.  Renal US is pending.  If she has right hydro, she will need an perc with antegrade stent.

## 2015-11-01 NOTE — Anesthesia Postprocedure Evaluation (Signed)
Anesthesia Post Note  Patient: Sandra [name]  Procedure(s) Performed: Procedure(s) (LRB): RE-STAGINGG TRANSURETHRAL RESECTION OF BLADDER TUMOR (TURBT) (N/A) CYSTOSCOPY WITH ATTEMPTED RIGHT URETERAL OPENING (Right)  Patient location during evaluation: PACU Anesthesia Type: General Level of consciousness: awake and alert Pain management: pain level controlled Vital Signs Assessment: post-procedure vital signs reviewed and stable Respiratory status: spontaneous breathing, nonlabored ventilation, respiratory function stable and patient connected to nasal cannula oxygen Cardiovascular status: blood pressure returned to baseline and stable Postop Assessment: no signs of nausea or vomiting Anesthetic complications: no    Last Vitals:  Vitals:   11/01/15 0151 11/01/15 0555  BP: 110/67 106/66  Pulse: 80 85  Resp: 16 16  Temp: 36.9 C 37.1 C    Last Pain:  Vitals:   11/01/15 0555  TempSrc: Oral  PainSc:                  Tiajuana Amass

## 2015-11-02 LAB — GLUCOSE, CAPILLARY
GLUCOSE-CAPILLARY: 124 mg/dL — AB (ref 65–99)
Glucose-Capillary: 167 mg/dL — ABNORMAL HIGH (ref 65–99)
Glucose-Capillary: 127 mg/dL — ABNORMAL HIGH (ref 65–99)
Glucose-Capillary: 190 mg/dL — ABNORMAL HIGH (ref 65–99)

## 2015-11-02 MED ORDER — CARBAMAZEPINE ER 200 MG PO CP12
300.0000 mg | ORAL_CAPSULE | Freq: Two times a day (BID) | ORAL | Status: DC
  Administered 2015-11-02 – 2015-11-03 (×3): 300 mg via ORAL
  Filled 2015-11-02 (×5): qty 1

## 2015-11-02 MED ORDER — OXYBUTYNIN CHLORIDE 5 MG PO TABS
5.0000 mg | ORAL_TABLET | Freq: Three times a day (TID) | ORAL | Status: DC | PRN
  Administered 2015-11-02 – 2015-11-03 (×2): 5 mg via ORAL
  Filled 2015-11-02 (×2): qty 1

## 2015-11-02 NOTE — Progress Notes (Signed)
2 Days Post-Op Subjective: Patient reports ongoing intermittent flank discomfort. This seems to occur intermittently. It is associated with some nausea and yesterday some diaphoresis. She underwent renal ultrasonography which failed to show any hydronephrosis bilaterally. Her catheter has continued to drain through the night and she has not required any hand irrigation. Her urine is a dark red/T color suggesting old blood. There are no clots noted. She does have a three-way Foley catheter but is currently not receiving continuous bladder irrigation.  Objective: Vital signs in last 24 hours: Temp:  [98.2 F (36.8 C)-98.7 F (37.1 C)] 98.2 F (36.8 C) (10/21 0524) Pulse Rate:  [81-104] 81 (10/21 0524) Resp:  [14-16] 16 (10/21 0524) BP: (90-128)/(38-75) 121/66 (10/21 0529) SpO2:  [90 %-95 %] 90 % (10/21 0524)  Intake/Output from previous day: 10/20 0701 - 10/21 0700 In: 2235 [P.O.:360; I.V.:1875] Out: 1100 [Urine:1100] Intake/Output this shift: No intake/output data recorded.  Physical Exam:  Constitutional: Vital signs reviewed. WD WN in NAD   Eyes: PERRL, No scleral icterus.   Cardiovascular: RRR Pulmonary/Chest: Normal effort Abdominal: Soft. Non-tender, non-distended. Mild right CVA tenderness. Genitourinary: Not examined. Foley catheter indwelling Extremities: No cyanosis or edema   Lab Results:  Recent Labs  10/31/15 1017 10/31/15 2308 11/01/15 0412  HGB 13.6 10.4* 10.2*  HCT 40.0 33.2* 32.7*   BMET  Recent Labs  10/31/15 1017  NA 137  K 4.8  GLUCOSE 139*   No results for input(s): LABPT, INR in the last 72 hours. No results for input(s): LABURIN in the last 72 hours. Results for orders placed or performed during the hospital encounter of 10/25/15  Culture, Urine     Status: Abnormal   Collection Time: 10/25/15 10:15 AM  Result Value Ref Range Status   Specimen Description URINE, RANDOM  Final   Special Requests NONE  Final   Culture MULTIPLE SPECIES  PRESENT, SUGGEST RECOLLECTION (A)  Final   Report Status 10/27/2015 FINAL  Final    Studies/Results: US Renal  Result Date: 11/01/2015 CLINICAL DATA:  History of bladder cancer. Evaluate for obstruction. EXAM: RENAL / URINARY TRACT ULTRASOUND COMPLETE COMPARISON:  CT 09/26/2015 FINDINGS: Right Kidney: Length: 10.9 cm. Echogenicity within normal limits. No mass or hydronephrosis visualized. Left Kidney: Length: 12.2 cm. Echogenicity within normal limits. No mass or hydronephrosis visualized. Bladder: Foley catheter present within a decompressed bladder. IMPRESSION: No evidence of hydronephrosis/ obstruction. Electronically Signed   By: Marin Olp M.D.   On: 11/01/2015 18:22    Assessment/Plan:   Ongoing intermittent right flank pain and gross hematuria. The urine is darker in color suggesting old blood. She will have hand irrigation as needed. We will plan on keeping her today for pain control and ongoing supportive care/monitoring. If she persists in having flank pain today we will consider repeat renal ultrasonography tomorrow. Oxybutynin  will be written for bladder spasms when necessary. We will recheck labs tomorrow.   LOS: 1 day   Nickola Lenig S 11/02/2015, 8:41 AM

## 2015-11-03 LAB — BASIC METABOLIC PANEL
ANION GAP: 5 (ref 5–15)
BUN: 9 mg/dL (ref 6–20)
CALCIUM: 8.6 mg/dL — AB (ref 8.9–10.3)
CO2: 28 mmol/L (ref 22–32)
Chloride: 105 mmol/L (ref 101–111)
Creatinine, Ser: 0.94 mg/dL (ref 0.44–1.00)
Glucose, Bld: 162 mg/dL — ABNORMAL HIGH (ref 65–99)
Potassium: 4.5 mmol/L (ref 3.5–5.1)
Sodium: 138 mmol/L (ref 135–145)

## 2015-11-03 LAB — GLUCOSE, CAPILLARY
GLUCOSE-CAPILLARY: 109 mg/dL — AB (ref 65–99)
GLUCOSE-CAPILLARY: 138 mg/dL — AB (ref 65–99)
Glucose-Capillary: 130 mg/dL — ABNORMAL HIGH (ref 65–99)
Glucose-Capillary: 138 mg/dL — ABNORMAL HIGH (ref 65–99)

## 2015-11-03 LAB — HEMOGLOBIN AND HEMATOCRIT, BLOOD
HCT: 26.9 % — ABNORMAL LOW (ref 36.0–46.0)
HEMOGLOBIN: 8.3 g/dL — AB (ref 12.0–15.0)

## 2015-11-03 NOTE — Progress Notes (Signed)
3 Days Post-Op Subjective: Patient reports ongoing lumbar sacral back discomfort with some lower right flank pain. Overall pain better than yesterday. Urine today is a dark tea color. She reports that the nurses had irrigator last night but the morning nurse cannot confirm that. No obvious clots this morning and the catheter is draining fairly well. She reports that she is uncomfortable going home with the ongoing hematuria and pain. She does not feel her family can take care of her. Hemoglobin has dropped from 10.2  To 8.3.  Objective: Vital signs in last 24 hours: Temp:  [98.1 F (36.7 C)-98.5 F (36.9 C)] 98.3 F (36.8 C) (10/22 0450) Pulse Rate:  [78-88] 88 (10/22 0450) Resp:  [16-18] 16 (10/22 0450) BP: (111-118)/(56-68) 116/68 (10/22 0450) SpO2:  [93 %-97 %] 96 % (10/22 0450)  Intake/Output from previous day: 10/21 0701 - 10/22 0700 In: 3480 [P.O.:480; I.V.:3000] Out: 2600 [Urine:2600] Intake/Output this shift: No intake/output data recorded.  Physical Exam:  Constitutional: Vital signs reviewed. WD WN in NAD   Eyes: PERRL, No scleral icterus.   Cardiovascular: RRR Pulmonary/Chest: Normal effort Abdominal: Soft. Non-tender, non-distended, bowel sounds are normal, no masses, organomegaly, or guarding present.  Genitourinary: Catheter draining tea-colored urine. Extremities: No cyanosis or edema   Lab Results:  Recent Labs  10/31/15 2308 11/01/15 0412 11/03/15 0550  HGB 10.4* 10.2* 8.3*  HCT 33.2* 32.7* 26.9*   BMET  Recent Labs  10/31/15 1017 11/03/15 0550  NA 137 138  K 4.8 4.5  CL  --  105  CO2  --  28  GLUCOSE 139* 162*  BUN  --  9  CREATININE  --  0.94  CALCIUM  --  8.6*   No results for input(s): LABPT, INR in the last 72 hours. No results for input(s): LABURIN in the last 72 hours. Results for orders placed or performed during the hospital encounter of 10/25/15  Culture, Urine     Status: Abnormal   Collection Time: 10/25/15 10:15 AM  Result  Value Ref Range Status   Specimen Description URINE, RANDOM  Final   Special Requests NONE  Final   Culture MULTIPLE SPECIES PRESENT, SUGGEST RECOLLECTION (A)  Final   Report Status 10/27/2015 FINAL  Final    Studies/Results: US Renal  Result Date: 11/01/2015 CLINICAL DATA:  History of bladder cancer. Evaluate for obstruction. EXAM: RENAL / URINARY TRACT ULTRASOUND COMPLETE COMPARISON:  CT 09/26/2015 FINDINGS: Right Kidney: Length: 10.9 cm. Echogenicity within normal limits. No mass or hydronephrosis visualized. Left Kidney: Length: 12.2 cm. Echogenicity within normal limits. No mass or hydronephrosis visualized. Bladder: Foley catheter present within a decompressed bladder. IMPRESSION: No evidence of hydronephrosis/ obstruction. Electronically Signed   By: Marin Olp M.D.   On: 11/01/2015 18:22    Assessment/Plan:   Gross hematuria. This appears to be mostly old blood and urine is now tea-colored. No current clots noted in catheter appears to be draining well. He does continue to complain of some back discomfort but does have chronic musculoskeletal back issues. There was no hydronephrosis on recent ultrasound and I do not think additional imaging studies are necessary at this time. Given her reticence to go home we will keep her today and let her discuss her ongoing management with Dr. Jeffie Pollock   LOS: 2 days   Kevan Prouty S 11/03/2015, 9:11 AM

## 2015-11-03 NOTE — Progress Notes (Signed)
Patient complained of pressure, Irrigated catheter, bloody clots noted from return. Pt received immediate relief and 500cc return. SRP, RN

## 2015-11-04 DIAGNOSIS — D62 Acute posthemorrhagic anemia: Secondary | ICD-10-CM | POA: Diagnosis not present

## 2015-11-04 LAB — GLUCOSE, CAPILLARY: Glucose-Capillary: 144 mg/dL — ABNORMAL HIGH (ref 65–99)

## 2015-11-04 MED ORDER — FERROUS SULFATE 325 (65 FE) MG PO TABS: 325.0000 mg | ORAL_TABLET | Freq: Three times a day (TID) | ORAL | 1 refills | Status: DC

## 2015-11-04 MED ORDER — HYDROCODONE-ACETAMINOPHEN 5-325 MG PO TABS: 1.0000 | ORAL_TABLET | ORAL | 0 refills | Status: DC | PRN

## 2015-11-04 NOTE — Discharge Summary (Signed)
Physician Discharge Summary  Patient ID: Sandra [name] MRN: [id number] DOB/AGE: 13-Mar-1975 40 y.o.  Admit date: 10/31/2015 Discharge date: 11/04/2015  Admission Diagnoses:  Bladder cancer [facility])  Discharge Diagnoses:  Principal Problem:   Bladder cancer ([place]) Active Problems:   Postoperative anemia due to acute blood loss   Past Medical History:  Diagnosis Date  . ADHD (attention deficit hyperactivity disorder)   . Allergic rhinitis   . Anemia    hx of  . Bipolar 1 disorder ([place])   . Bladder tumor   . Dizziness   . Full dentures   . GERD (gastroesophageal reflux disease)   . Gross hematuria   . Hyperlipidemia   . Type 2 diabetes mellitus ([place])   . Urgency of urination    dysuria, sui  . Wears glasses     Surgeries: Procedure(s): RE-STAGINGG TRANSURETHRAL RESECTION OF BLADDER TUMOR (TURBT) CYSTOSCOPY WITH ATTEMPTED RIGHT URETERAL OPENING on 10/31/2015   Consultants (if any):   Discharged Condition: Improved  Hospital Course: Sandra [name] is an 40 y.o. female who was admitted 10/31/2015 with a diagnosis of Bladder cancer ([place]) with invasion but no muscle on the initially resection 2 weeks prior and went to the operating room on 10/31/2015 and underwent the above named procedures.  She had transection of the right UO because of the tumor location and a stent couldn't be placed.   She began to have some bleeding in the PACU and was irrigated sequentially with return of clots and eventual clearing.  Her urine is clear this morning.  She continues to have some right mid back pain but a renal US on Friday was negative for hydro.   Her Hgb has declined to 8.3gm with the bleeding.   I have removed her foley and will send her home with iron supplementation.  Her path is pending.   She was given perioperative antibiotics:  Anti-infectives    Start     Dose/Rate Route Frequency Ordered Stop   10/31/15 0947  ceFAZolin (ANCEF) IVPB 2g/100 mL premix     2 g 200 mL/hr over 30  Minutes Intravenous 30 min pre-op 10/31/15 0947 10/31/15 1038    .  She was given sequential compression devices for DVT prophylaxis.  She benefited maximally from the hospital stay and there were no complications.    Recent vital signs:  Vitals:   11/03/15 2159 11/04/15 0650  BP: (!) 110/55 (!) 129/58  Pulse: 84 71  Resp: 18 16  Temp: 98.1 F (36.7 C) 98.1 F (36.7 C)    Recent laboratory studies:  Lab Results  Component Value Date   HGB 8.3 (L) 11/03/2015   HGB 10.2 (L) 11/01/2015   HGB 10.4 (L) 10/31/2015   Lab Results  Component Value Date   WBC 9.5 06/16/2015   PLT 313 06/16/2015   No results found for: INR Lab Results  Component Value Date   NA 138 11/03/2015   K 4.5 11/03/2015   CL 105 11/03/2015   CO2 28 11/03/2015   BUN 9 11/03/2015   CREATININE 0.94 11/03/2015   GLUCOSE 162 (H) 11/03/2015    Discharge Medications:     Medication List    TAKE these medications   ALPRAZolam 0.25 MG tablet Commonly known as:  XANAX Take 0.25 mg by mouth 3 (three) times daily.   benzonatate 100 MG capsule Commonly known as:  TESSALON Take 1 capsule by mouth 3 (three) times daily as needed for cough.   cetirizine 10 MG tablet  Commonly known as:  ZYRTEC Take 1 tablet by mouth every morning.   diclofenac 75 MG EC tablet Commonly known as:  VOLTAREN Take 1 tablet (75 mg total) by mouth 2 (two) times daily with a meal.   EQUETRO 300 MG Cp12 Generic drug:  Carbamazepine Take 1 capsule by mouth 2 (two) times daily.   ferrous sulfate 325 (65 FE) MG tablet Take 1 tablet (325 mg total) by mouth 3 (three) times daily with meals.   FLUoxetine 10 MG capsule Commonly known as:  PROZAC Take 10 mg by mouth every morning.   fluticasone 50 MCG/ACT nasal spray Commonly known as:  FLONASE Place 2 sprays into both nostrils daily.   HYDROcodone-acetaminophen 5-325 MG tablet Commonly known as:  NORCO Take 1 tablet by mouth every 6 (six) hours as needed for moderate  pain.   HYDROcodone-acetaminophen 5-325 MG tablet Commonly known as:  NORCO/VICODIN Take 1 tablet by mouth every 4 (four) hours as needed for moderate pain.   LATUDA 40 MG Tabs tablet Generic drug:  lurasidone Take 40 mg by mouth at bedtime.   lovastatin 40 MG tablet Commonly known as:  MEVACOR Take 40 mg by mouth at bedtime.   metFORMIN 500 MG tablet Commonly known as:  GLUCOPHAGE Take 500 mg by mouth 2 (two) times daily with a meal.   montelukast 10 MG tablet Commonly known as:  SINGULAIR Take 10 mg by mouth at bedtime.   pantoprazole 40 MG tablet Commonly known as:  PROTONIX Take 1 tablet by mouth every evening.   phenazopyridine 200 MG tablet Commonly known as:  PYRIDIUM Take 1 tablet (200 mg total) by mouth 3 (three) times daily as needed for pain.   PROAIR HFA 108 (90 Base) MCG/ACT inhaler Generic drug:  albuterol Inhale 1-2 puffs into the lungs every 4 (four) hours as needed for shortness of breath.   traMADol-acetaminophen 37.5-325 MG tablet Commonly known as:  ULTRACET Take 1 tablet by mouth every 6 (six) hours as needed.   VYVANSE 70 MG capsule Generic drug:  lisdexamfetamine Take 70 mg by mouth daily.       Diagnostic Studies: US Renal  Result Date: 11/01/2015 CLINICAL DATA:  History of bladder cancer. Evaluate for obstruction. EXAM: RENAL / URINARY TRACT ULTRASOUND COMPLETE COMPARISON:  CT 09/26/2015 FINDINGS: Right Kidney: Length: 10.9 cm. Echogenicity within normal limits. No mass or hydronephrosis visualized. Left Kidney: Length: 12.2 cm. Echogenicity within normal limits. No mass or hydronephrosis visualized. Bladder: Foley catheter present within a decompressed bladder. IMPRESSION: No evidence of hydronephrosis/ obstruction. Electronically Signed   By: Marin Olp M.D.   On: 11/01/2015 18:22    Disposition: 01-Home or Self Care  Discharge Instructions    Discontinue IV    Complete by:  As directed       Follow-up Information    Malka So, MD.   Specialty:  Urology Why:  If not scheduled, please contact the office for f/u in 1-2 weeks.  Contact information: [address] [place] [address] [phone]            Signed: Malka So 11/04/2015, 7:46 AM

## 2015-11-08 ENCOUNTER — Ambulatory Visit (INDEPENDENT_AMBULATORY_CARE_PROVIDER_SITE_OTHER): Payer: Medicaid Other | Admitting: Urology

## 2015-11-08 DIAGNOSIS — R310 Gross hematuria: Secondary | ICD-10-CM

## 2015-11-08 DIAGNOSIS — C670 Malignant neoplasm of trigone of bladder: Secondary | ICD-10-CM

## 2015-11-08 DIAGNOSIS — N3941 Urge incontinence: Secondary | ICD-10-CM

## 2015-11-08 DIAGNOSIS — D62 Acute posthemorrhagic anemia: Secondary | ICD-10-CM | POA: Diagnosis not present

## 2015-11-14 ENCOUNTER — Other Ambulatory Visit: Payer: Self-pay | Admitting: Urology

## 2015-11-14 DIAGNOSIS — C670 Malignant neoplasm of trigone of bladder: Secondary | ICD-10-CM

## 2015-11-20 ENCOUNTER — Ambulatory Visit (HOSPITAL_COMMUNITY): Admission: RE | Admit: 2015-11-20 | Payer: Medicaid Other | Source: Ambulatory Visit

## 2015-11-20 ENCOUNTER — Ambulatory Visit: Payer: Medicaid Other | Admitting: Urology

## 2016-01-08 ENCOUNTER — Other Ambulatory Visit: Payer: Self-pay | Admitting: Urology

## 2016-01-08 ENCOUNTER — Ambulatory Visit (INDEPENDENT_AMBULATORY_CARE_PROVIDER_SITE_OTHER): Payer: Medicaid Other | Admitting: Urology

## 2016-01-08 ENCOUNTER — Ambulatory Visit (HOSPITAL_COMMUNITY)
Admission: RE | Admit: 2016-01-08 | Discharge: 2016-01-08 | Disposition: A | Discharge status: Home / Self Care | Payer: Medicaid Other | Source: Ambulatory Visit | Attending: Urology | Admitting: Urology

## 2016-01-08 DIAGNOSIS — C670 Malignant neoplasm of trigone of bladder: Secondary | ICD-10-CM

## 2016-01-10 ENCOUNTER — Ambulatory Visit (INDEPENDENT_AMBULATORY_CARE_PROVIDER_SITE_OTHER): Payer: Medicaid Other | Admitting: Urology

## 2016-01-10 DIAGNOSIS — C670 Malignant neoplasm of trigone of bladder: Secondary | ICD-10-CM | POA: Diagnosis not present

## 2016-01-17 ENCOUNTER — Ambulatory Visit: Payer: Medicaid Other | Admitting: Urology

## 2016-01-24 ENCOUNTER — Ambulatory Visit (INDEPENDENT_AMBULATORY_CARE_PROVIDER_SITE_OTHER): Payer: Medicaid Other | Admitting: Urology

## 2016-01-24 DIAGNOSIS — C670 Malignant neoplasm of trigone of bladder: Secondary | ICD-10-CM | POA: Diagnosis not present

## 2016-01-31 ENCOUNTER — Ambulatory Visit (INDEPENDENT_AMBULATORY_CARE_PROVIDER_SITE_OTHER): Payer: Medicaid Other | Admitting: Urology

## 2016-01-31 DIAGNOSIS — E119 Type 2 diabetes mellitus without complications: Secondary | ICD-10-CM | POA: Insufficient documentation

## 2016-01-31 DIAGNOSIS — R112 Nausea with vomiting, unspecified: Secondary | ICD-10-CM | POA: Insufficient documentation

## 2016-01-31 DIAGNOSIS — R300 Dysuria: Secondary | ICD-10-CM | POA: Insufficient documentation

## 2016-01-31 DIAGNOSIS — Z8551 Personal history of malignant neoplasm of bladder: Secondary | ICD-10-CM | POA: Diagnosis not present

## 2016-01-31 DIAGNOSIS — R109 Unspecified abdominal pain: Secondary | ICD-10-CM | POA: Insufficient documentation

## 2016-01-31 DIAGNOSIS — Z7984 Long term (current) use of oral hypoglycemic drugs: Secondary | ICD-10-CM | POA: Insufficient documentation

## 2016-01-31 DIAGNOSIS — R319 Hematuria, unspecified: Secondary | ICD-10-CM | POA: Diagnosis not present

## 2016-01-31 DIAGNOSIS — Z79899 Other long term (current) drug therapy: Secondary | ICD-10-CM | POA: Diagnosis not present

## 2016-01-31 DIAGNOSIS — Z87891 Personal history of nicotine dependence: Secondary | ICD-10-CM | POA: Diagnosis not present

## 2016-01-31 DIAGNOSIS — M545 Low back pain: Secondary | ICD-10-CM | POA: Diagnosis not present

## 2016-01-31 DIAGNOSIS — F909 Attention-deficit hyperactivity disorder, unspecified type: Secondary | ICD-10-CM | POA: Insufficient documentation

## 2016-01-31 DIAGNOSIS — C670 Malignant neoplasm of trigone of bladder: Secondary | ICD-10-CM

## 2016-02-01 ENCOUNTER — Emergency Department (HOSPITAL_COMMUNITY): Payer: Medicaid Other

## 2016-02-01 ENCOUNTER — Emergency Department (HOSPITAL_COMMUNITY)
Admission: EM | Admit: 2016-02-01 | Discharge: 2016-02-01 | Disposition: A | Discharge status: Home / Self Care | Payer: Medicaid Other | Attending: Emergency Medicine | Admitting: Emergency Medicine

## 2016-02-01 ENCOUNTER — Encounter (HOSPITAL_COMMUNITY): Payer: Self-pay

## 2016-02-01 DIAGNOSIS — R112 Nausea with vomiting, unspecified: Secondary | ICD-10-CM

## 2016-02-01 LAB — I-STAT CG4 LACTIC ACID, ED
LACTIC ACID, VENOUS: 3.2 mmol/L — AB (ref 0.5–1.9)
Lactic Acid, Venous: 1.39 mmol/L (ref 0.5–1.9)

## 2016-02-01 LAB — COMPREHENSIVE METABOLIC PANEL
ALT: 21 U/L (ref 14–54)
AST: 21 U/L (ref 15–41)
Albumin: 3.8 g/dL (ref 3.5–5.0)
Alkaline Phosphatase: 128 U/L — ABNORMAL HIGH (ref 38–126)
Anion gap: 10 (ref 5–15)
BILIRUBIN TOTAL: 0.4 mg/dL (ref 0.3–1.2)
BUN: 13 mg/dL (ref 6–20)
CALCIUM: 9.4 mg/dL (ref 8.9–10.3)
CO2: 23 mmol/L (ref 22–32)
CREATININE: 0.83 mg/dL (ref 0.44–1.00)
Chloride: 100 mmol/L — ABNORMAL LOW (ref 101–111)
Glucose, Bld: 176 mg/dL — ABNORMAL HIGH (ref 65–99)
Potassium: 3.8 mmol/L (ref 3.5–5.1)
SODIUM: 133 mmol/L — AB (ref 135–145)
Total Protein: 8 g/dL (ref 6.5–8.1)

## 2016-02-01 LAB — LIPASE, BLOOD: Lipase: 24 U/L (ref 11–51)

## 2016-02-01 LAB — PREGNANCY, URINE: PREG TEST UR: NEGATIVE

## 2016-02-01 LAB — CBC WITH DIFFERENTIAL/PLATELET
Basophils Absolute: 0 10*3/uL (ref 0.0–0.1)
Basophils Relative: 0 %
Eosinophils Absolute: 0.1 10*3/uL (ref 0.0–0.7)
Eosinophils Relative: 1 %
HEMATOCRIT: 39.5 % (ref 36.0–46.0)
HEMOGLOBIN: 12.3 g/dL (ref 12.0–15.0)
LYMPHS ABS: 4 10*3/uL (ref 0.7–4.0)
LYMPHS PCT: 30 %
MCH: 23.9 pg — AB (ref 26.0–34.0)
MCHC: 31.1 g/dL (ref 30.0–36.0)
MCV: 76.8 fL — AB (ref 78.0–100.0)
Monocytes Absolute: 0.8 10*3/uL (ref 0.1–1.0)
Monocytes Relative: 6 %
NEUTROS ABS: 8.5 10*3/uL — AB (ref 1.7–7.7)
NEUTROS PCT: 63 %
Platelets: 415 10*3/uL — ABNORMAL HIGH (ref 150–400)
RBC: 5.14 MIL/uL — AB (ref 3.87–5.11)
RDW: 15.2 % (ref 11.5–15.5)
WBC: 13.4 10*3/uL — AB (ref 4.0–10.5)

## 2016-02-01 LAB — URINALYSIS, ROUTINE W REFLEX MICROSCOPIC
BILIRUBIN URINE: NEGATIVE
Glucose, UA: NEGATIVE mg/dL
HGB URINE DIPSTICK: NEGATIVE
Ketones, ur: NEGATIVE mg/dL
Leukocytes, UA: NEGATIVE
Nitrite: NEGATIVE
Protein, ur: NEGATIVE mg/dL
SPECIFIC GRAVITY, URINE: 1.025 (ref 1.005–1.030)
pH: 5.5 (ref 5.0–8.0)

## 2016-02-01 MED ORDER — ONDANSETRON HCL 4 MG PO TABS: 4.0000 mg | ORAL_TABLET | Freq: Four times a day (QID) | ORAL | 0 refills | Status: DC

## 2016-02-01 MED ORDER — SODIUM CHLORIDE 0.9 % IV BOLUS (SEPSIS)
1000.0000 mL | Freq: Once | INTRAVENOUS | Status: AC
Start: 2016-02-01 — End: 2016-02-01
  Administered 2016-02-01: 1000 mL via INTRAVENOUS

## 2016-02-01 MED ORDER — KETOROLAC TROMETHAMINE 30 MG/ML IJ SOLN
30.0000 mg | Freq: Once | INTRAMUSCULAR | Status: AC
  Administered 2016-02-01: 30 mg via INTRAVENOUS
  Filled 2016-02-01: qty 1

## 2016-02-01 MED ORDER — MORPHINE SULFATE (PF) 4 MG/ML IV SOLN
4.0000 mg | Freq: Once | INTRAVENOUS | Status: AC
  Administered 2016-02-01: 4 mg via INTRAVENOUS
  Filled 2016-02-01: qty 1

## 2016-02-01 MED ORDER — ONDANSETRON HCL 4 MG/2ML IJ SOLN
4.0000 mg | Freq: Once | INTRAMUSCULAR | Status: AC
  Administered 2016-02-01: 4 mg via INTRAVENOUS
  Filled 2016-02-01: qty 2

## 2016-02-01 MED ORDER — SODIUM CHLORIDE 0.9 % IV BOLUS (SEPSIS)
1000.0000 mL | Freq: Once | INTRAVENOUS | Status: AC
  Administered 2016-02-01: 1000 mL via INTRAVENOUS

## 2016-02-01 MED ORDER — IOPAMIDOL (ISOVUE-300) INJECTION 61%
100.0000 mL | Freq: Once | INTRAVENOUS | Status: AC | PRN
  Administered 2016-02-01: 100 mL via INTRAVENOUS

## 2016-02-01 NOTE — ED Notes (Signed)
Pt tolerated fluid challenge well. 

## 2016-02-01 NOTE — Discharge Instructions (Signed)
Use the nausea medication as prescribed. Keep yourself hydrated. Followup with your doctor. Return to the ED if you develop new or worsening symptoms.

## 2016-02-01 NOTE — ED Triage Notes (Signed)
Pt states she had chemo for her bladder cancer this am, states she started vomiting tonight and is having increased pain

## 2016-02-01 NOTE — ED Provider Notes (Signed)
[facility] DEPT Provider Note   CSN: [id number] Arrival date & time: 01/31/16  2338     History   Chief Complaint Chief Complaint  Patient presents with  . Emesis    s/p chemo treatment today    HPI Sandra [name] is a 41 y.o. female.  Patient presents with body aches and nausea and vomiting that onset after getting chemotherapy this afternoon.  States she felt well prior to this.  She was getting her 3rd dose of weekly transuretheral chemotherapy per Dr. Jeffie Pollock.  Several hours after receiving her chemotherapy she developed "pain aching all over and my bones" as well as 3 episodes of nausea and vomiting. She states she's had nauseated before but this is the most severe it spent. Denies any blood in her emesis. Denies any fever. Denies any diarrhea. States she has some abdominal pain with coughing only. Cough is nonproductive. No chest pain or shortness of breath. Has blood in her urine at baseline. He's had previous transurethral resection of bladder tumor but is still receiving chemotherapy. She has some pain with urination but denies fever.   The history is provided by the patient.  Emesis   Associated symptoms include abdominal pain, arthralgias and myalgias. Pertinent negatives include no cough, no fever and no headaches.    Past Medical History:  Diagnosis Date  . ADHD (attention deficit hyperactivity disorder)   . Allergic rhinitis   . Anemia    hx of  . Bipolar 1 disorder ([place])   . Bladder tumor   . Dizziness   . Full dentures   . GERD (gastroesophageal reflux disease)   . Gross hematuria   . Hyperlipidemia   . Type 2 diabetes mellitus ([place])   . Urgency of urination    dysuria, sui  . Wears glasses     Patient Active Problem List   Diagnosis Date Noted  . Postoperative anemia due to acute blood loss 11/04/2015  . Bladder cancer ([place]) 10/31/2015  . CARPAL TUNNEL SYNDROME 10/02/2009    Past Surgical History:  Procedure Laterality Date  . CYSTOSCOPY N/A  10/03/2015   Procedure: CYSTOSCOPY;  Surgeon: Irine Seal, MD;  Location: [facility];  Service: Urology;  Laterality: N/A;  . CYSTOSCOPY WITH STENT PLACEMENT Right 10/31/2015   Procedure: CYSTOSCOPY WITH ATTEMPTED RIGHT URETERAL OPENING;  Surgeon: Irine Seal, MD;  Location: [facility];  Service: Urology;  Laterality: Right;  . MULTIPLE TOOTH EXTRACTIONS  2015  . OVARIAN CYST REMOVAL Right 2004 approx  . TONSILLECTOMY  12/30/2004  . TOTAL ABDOMINAL HYSTERECTOMY W/ BILATERAL SALPINGOOPHORECTOMY  05/16/2004  . TRANSURETHRAL RESECTION OF BLADDER TUMOR N/A 10/03/2015   Procedure: TRANSURETHRAL RESECTION OF BLADDER TUMOR (TURBT);  Surgeon: Irine Seal, MD;  Location: [facility];  Service: Urology;  Laterality: N/A;  . TRANSURETHRAL RESECTION OF BLADDER TUMOR N/A 10/31/2015   Procedure: RE-STAGINGG TRANSURETHRAL RESECTION OF BLADDER TUMOR (TURBT);  Surgeon: Irine Seal, MD;  Location: [facility];  Service: Urology;  Laterality: N/A;    OB History    Gravida Para Term Preterm AB Living   2         2   SAB TAB Ectopic Multiple Live Births                   Home Medications    Prior to Admission medications   Medication Sig Start Date End Date Taking? Authorizing Provider  ALPRAZolam Duanne Moron) 0.25 MG tablet Take 0.25 mg by mouth  3 (three) times daily. 10/14/15   Historical Provider, MD  benzonatate (TESSALON) 100 MG capsule Take 1 capsule by mouth 3 (three) times daily as needed for cough. 10/16/15   Historical Provider, MD  cetirizine (ZYRTEC) 10 MG tablet Take 1 tablet by mouth every morning.  10/09/14   Historical Provider, MD  diclofenac (VOLTAREN) 75 MG EC tablet Take 1 tablet (75 mg total) by mouth 2 (two) times daily with a meal. 08/27/15   Sanjuana Kava, MD  EQUETRO 300 MG CP12 Take 1 capsule by mouth 2 (two) times daily. 10/24/14   Historical Provider, MD  ferrous sulfate 325 (65 FE) MG tablet Take 1 tablet (325 mg total) by mouth 3  (three) times daily with meals. 11/04/15   Irine Seal, MD  FLUoxetine (PROZAC) 10 MG capsule Take 10 mg by mouth every morning. 10/27/14   Historical Provider, MD  fluticasone (FLONASE) 50 MCG/ACT nasal spray Place 2 sprays into both nostrils daily. 03/28/13   Elisha Headland, FNP  HYDROcodone-acetaminophen (NORCO) 5-325 MG tablet Take 1 tablet by mouth every 6 (six) hours as needed for moderate pain. 10/31/15   Irine Seal, MD  HYDROcodone-acetaminophen (NORCO/VICODIN) 5-325 MG tablet Take 1 tablet by mouth every 4 (four) hours as needed for moderate pain. 11/04/15   Irine Seal, MD  LATUDA 40 MG TABS tablet Take 40 mg by mouth at bedtime. 10/16/15   Historical Provider, MD  lovastatin (MEVACOR) 40 MG tablet Take 40 mg by mouth at bedtime.    Historical Provider, MD  metFORMIN (GLUCOPHAGE) 500 MG tablet Take 500 mg by mouth 2 (two) times daily with a meal.     Historical Provider, MD  montelukast (SINGULAIR) 10 MG tablet Take 10 mg by mouth at bedtime. 10/23/15   Historical Provider, MD  pantoprazole (PROTONIX) 40 MG tablet Take 1 tablet by mouth every evening.  10/09/14   Historical Provider, MD  phenazopyridine (PYRIDIUM) 200 MG tablet Take 1 tablet (200 mg total) by mouth 3 (three) times daily as needed for pain. 10/03/15   Irine Seal, MD  PROAIR HFA 108 [phone] Base) MCG/ACT inhaler Inhale 1-2 puffs into the lungs every 4 (four) hours as needed for shortness of breath. 10/20/15   Historical Provider, MD  traMADol-acetaminophen (ULTRACET) 37.5-325 MG tablet Take 1 tablet by mouth every 6 (six) hours as needed. 10/31/15   Irine Seal, MD  VYVANSE 70 MG capsule Take 70 mg by mouth daily. 10/14/15   Historical Provider, MD    Family History Family History  Problem Relation Age of Onset  . Hypertension Mother   . Cancer Mother   . Breast cancer Maternal Aunt   . Lung cancer Maternal Uncle   . Cancer Other     Social History Social History  Substance Use Topics  . Smoking status: Former Smoker     Packs/day: 0.50    Types: Cigarettes  . Smokeless tobacco: Former Systems developer    Quit date: 09/29/2015     Comment: down to 1/2pp2d from 2 ppd on 09-24-2015  . Alcohol use No     Allergies   Benazepril; Metronidazole; Adhesive [tape]; Codeine; Loratadine; and Penicillins   Review of Systems Review of Systems  Constitutional: Positive for activity change and appetite change. Negative for fever.  HENT: Negative for congestion and rhinorrhea.   Respiratory: Negative for cough, chest tightness and shortness of breath.   Cardiovascular: Negative for chest pain.  Gastrointestinal: Positive for abdominal pain, nausea and vomiting.  Genitourinary: Positive for dysuria and hematuria.  Negative for vaginal bleeding and vaginal discharge.  Musculoskeletal: Positive for arthralgias and myalgias.  Neurological: Negative for dizziness, weakness, light-headedness and headaches.   A complete 10 system review of systems was obtained and all systems are negative except as noted in the HPI and PMH.    Physical Exam Updated Vital Signs BP 129/80   Pulse 83   Temp 98.1 F (36.7 C) (Oral)   Resp 22   Ht 4\' 11"  (1.499 m)   Wt 196 lb (88.9 kg)   SpO2 100%   BMI 39.59 kg/m   Physical Exam  Constitutional: She is oriented to person, place, and time. She appears well-developed and well-nourished. No distress.  HENT:  Head: Normocephalic and atraumatic.  Mouth/Throat: Oropharynx is clear and moist. No oropharyngeal exudate.  Eyes: Conjunctivae and EOM are normal. Pupils are equal, round, and reactive to light.  Neck: Normal range of motion. Neck supple.  No meningismus.  Cardiovascular: Normal rate, regular rhythm, normal heart sounds and intact distal pulses.   No murmur heard. Pulmonary/Chest: Effort normal and breath sounds normal. No respiratory distress. She exhibits no tenderness.  Abdominal: Soft. There is no tenderness. There is no rebound and no guarding.  Musculoskeletal: Normal range of  motion. She exhibits no edema or tenderness.  Paraspinal lumbar tenderness  Neurological: She is alert and oriented to person, place, and time. No cranial nerve deficit. She exhibits normal muscle tone. Coordination normal.  No ataxia on finger to nose bilaterally. No pronator drift. 5/5 strength throughout. CN 2-12 intact.Equal grip strength. Sensation intact.   Skin: Skin is warm. Capillary refill takes less than 2 seconds.  Psychiatric: She has a normal mood and affect. Her behavior is normal.  Nursing note and vitals reviewed.    ED Treatments / Results  Labs (all labs ordered are listed, but only abnormal results are displayed) Labs Reviewed  CBC WITH DIFFERENTIAL/PLATELET - Abnormal; Notable for the following:       Result Value   WBC 13.4 (*)    RBC 5.14 (*)    MCV 76.8 (*)    MCH 23.9 (*)    Platelets 415 (*)    Neutro Abs 8.5 (*)    All other components within normal limits  COMPREHENSIVE METABOLIC PANEL - Abnormal; Notable for the following:    Sodium 133 (*)    Chloride 100 (*)    Glucose, Bld 176 (*)    Alkaline Phosphatase 128 (*)    All other components within normal limits  I-STAT CG4 LACTIC ACID, ED - Abnormal; Notable for the following:    Lactic Acid, Venous 3.20 (*)    All other components within normal limits  CULTURE, BLOOD (ROUTINE X 2)  CULTURE, BLOOD (ROUTINE X 2)  URINE CULTURE  LIPASE, BLOOD  URINALYSIS, ROUTINE W REFLEX MICROSCOPIC  PREGNANCY, URINE  I-STAT CG4 LACTIC ACID, ED  I-STAT CG4 LACTIC ACID, ED  I-STAT CG4 LACTIC ACID, ED    EKG  EKG Interpretation None       Radiology Dg Chest 2 View  Result Date: 02/01/2016 CLINICAL DATA:  41 y/o  F; 3-4 days of cough. EXAM: CHEST  2 VIEW COMPARISON:  11/25/2014 chest radiograph FINDINGS: The heart size and mediastinal contours are within normal limits and stable. Both lungs are clear. The visualized skeletal structures are unremarkable. IMPRESSION: No active cardiopulmonary disease.  Electronically Signed   By: Kristine Garbe M.D.   On: 02/01/2016 03:12   Ct Abdomen Pelvis W Contrast  Result Date:  02/01/2016 CLINICAL DATA:  Vomiting.  Lactic acidosis.  Bladder cancer. EXAM: CT ABDOMEN AND PELVIS WITH CONTRAST TECHNIQUE: Multidetector CT imaging of the abdomen and pelvis was performed using the standard protocol following bolus administration of intravenous contrast. CONTRAST:  145mL ISOVUE-300 IOPAMIDOL (ISOVUE-300) INJECTION 61% COMPARISON:  CT 09/26/2015 FINDINGS: Lower chest: The lung bases are clear. No consolidation or pleural fluid. Small hiatal hernia with distal esophageal wall thickening. Hepatobiliary: Hepatic steatosis without focal lesion. Gallbladder is decompressed. No biliary dilatation. Pancreas: No ductal dilatation or inflammation. Spleen: Normal in size without focal abnormality. Adrenals/Urinary Tract: Normal adrenal glands. No hydronephrosis or perinephric edema. Symmetric renal enhancement and excretion on delayed phase imaging. Ureters are decompressed. The bladder lesions on prior CT are not demonstrated currently. No bladder wall thickening. Stomach/Bowel: In stomach is distended with ingested contrast. No gastric wall thickening. No obstruction. No small bowel dilatation or inflammation. The appendix is normal. Moderate stool burden without colonic wall thickening. Vascular/Lymphatic: Mild abdominal atherosclerosis. No aneurysm. No abdominal, pelvic, or retroperitoneal adenopathy. Reproductive: Status post hysterectomy. No adnexal masses. Other: Tiny fat containing umbilical hernia. No free air, free fluid, or intra-abdominal fluid collection. Musculoskeletal: There are no acute or suspicious osseous abnormalities. IMPRESSION: 1. Small hiatal hernia with distal esophageal wall thickening, may be reflux or esophagitis. 2. Otherwise no acute abnormality in the abdomen/pelvis. 3. Hepatic steatosis. Atherosclerosis. Small fat containing umbilical hernia.  Electronically Signed   By: Jeb Levering M.D.   On: 02/01/2016 04:02    Procedures Procedures (including critical care time)  Medications Ordered in ED Medications  sodium chloride 0.9 % bolus 1,000 mL (1,000 mLs Intravenous New Bag/Given 02/01/16 0136)  ondansetron (ZOFRAN) injection 4 mg (4 mg Intravenous Given 02/01/16 0136)     Initial Impression / Assessment and Plan / ED Course  I have reviewed the triage vital signs and the nursing notes.  Pertinent labs & imaging results that were available during my care of the patient were reviewed by me and considered in my medical decision making (see chart for details).    Nausea vomiting body aches after receiving chemotherapy this afternoon. Afebrile. Abdomen benign.  Labs with leukocytosis. Elevated lactate. No fever.   Lactate is cleared on recheck. After IV hydration. Urinalysis is negative. Blood and urine cultures sent. Patient is afebrile rectally. No neutropenia.  CT scan is reassuring. No vomiting in the ED. She is tolerating by mouth. Suspect side effect of chemotherapy. Continues to complain of diffuse body aches but is afebrile. No cough. Discussed possibility of early flu. She did receive a flu vaccine. Continue hydration at home. Followup with PCP. Return precautions discussed.   Final Clinical Impressions(s) / ED Diagnoses   Final diagnoses:  Non-intractable vomiting with nausea, unspecified vomiting type    New Prescriptions New Prescriptions   No medications on file     Ezequiel Essex, MD 02/01/16 0730

## 2016-02-01 NOTE — ED Notes (Signed)
Pt given water to drink for fluid challenge 

## 2016-02-02 LAB — URINE CULTURE

## 2016-02-04 ENCOUNTER — Encounter (HOSPITAL_COMMUNITY): Payer: Self-pay | Admitting: Emergency Medicine

## 2016-02-04 ENCOUNTER — Emergency Department (HOSPITAL_COMMUNITY)
Admission: EM | Admit: 2016-02-04 | Discharge: 2016-02-05 | Disposition: A | Discharge status: Home / Self Care | Payer: Medicaid Other | Attending: Emergency Medicine | Admitting: Emergency Medicine

## 2016-02-04 DIAGNOSIS — T50905D Adverse effect of unspecified drugs, medicaments and biological substances, subsequent encounter: Secondary | ICD-10-CM | POA: Diagnosis not present

## 2016-02-04 DIAGNOSIS — T887XXD Unspecified adverse effect of drug or medicament, subsequent encounter: Secondary | ICD-10-CM | POA: Diagnosis not present

## 2016-02-04 DIAGNOSIS — F909 Attention-deficit hyperactivity disorder, unspecified type: Secondary | ICD-10-CM | POA: Diagnosis not present

## 2016-02-04 DIAGNOSIS — R112 Nausea with vomiting, unspecified: Secondary | ICD-10-CM | POA: Insufficient documentation

## 2016-02-04 DIAGNOSIS — Y829 Unspecified medical devices associated with adverse incidents: Secondary | ICD-10-CM | POA: Insufficient documentation

## 2016-02-04 DIAGNOSIS — F17210 Nicotine dependence, cigarettes, uncomplicated: Secondary | ICD-10-CM | POA: Insufficient documentation

## 2016-02-04 DIAGNOSIS — E119 Type 2 diabetes mellitus without complications: Secondary | ICD-10-CM | POA: Insufficient documentation

## 2016-02-04 DIAGNOSIS — Z7984 Long term (current) use of oral hypoglycemic drugs: Secondary | ICD-10-CM | POA: Insufficient documentation

## 2016-02-04 DIAGNOSIS — Z79899 Other long term (current) drug therapy: Secondary | ICD-10-CM | POA: Diagnosis not present

## 2016-02-04 HISTORY — DX: Malignant (primary) neoplasm, unspecified: C80.1

## 2016-02-04 LAB — COMPREHENSIVE METABOLIC PANEL
ALBUMIN: 3.7 g/dL (ref 3.5–5.0)
ALT: 21 U/L (ref 14–54)
AST: 21 U/L (ref 15–41)
Alkaline Phosphatase: 115 U/L (ref 38–126)
Anion gap: 9 (ref 5–15)
BUN: 10 mg/dL (ref 6–20)
CHLORIDE: 101 mmol/L (ref 101–111)
CO2: 22 mmol/L (ref 22–32)
CREATININE: 0.78 mg/dL (ref 0.44–1.00)
Calcium: 9.4 mg/dL (ref 8.9–10.3)
GFR calc Af Amer: >60 mL/min (ref >60)
GFR calc non Af Amer: >60 mL/min (ref >60)
GLUCOSE: 99 mg/dL (ref 65–99)
Potassium: 4.2 mmol/L (ref 3.5–5.1)
Sodium: 132 mmol/L — ABNORMAL LOW (ref 135–145)
Total Bilirubin: 0.3 mg/dL (ref 0.3–1.2)
Total Protein: 7.8 g/dL (ref 6.5–8.1)

## 2016-02-04 LAB — CBC WITH DIFFERENTIAL/PLATELET
Basophils Absolute: 0 10*3/uL (ref 0.0–0.1)
Basophils Relative: 0 %
EOS PCT: 1 %
Eosinophils Absolute: 0.2 10*3/uL (ref 0.0–0.7)
HCT: 38.5 % (ref 36.0–46.0)
Hemoglobin: 12.1 g/dL (ref 12.0–15.0)
LYMPHS ABS: 3.8 10*3/uL (ref 0.7–4.0)
LYMPHS PCT: 35 %
MCH: 23.7 pg — AB (ref 26.0–34.0)
MCHC: 31.4 g/dL (ref 30.0–36.0)
MCV: 75.3 fL — AB (ref 78.0–100.0)
MONO ABS: 0.6 10*3/uL (ref 0.1–1.0)
MONOS PCT: 5 %
NEUTROS ABS: 6.2 10*3/uL (ref 1.7–7.7)
Neutrophils Relative %: 59 %
Platelets: 401 10*3/uL — ABNORMAL HIGH (ref 150–400)
RBC: 5.11 MIL/uL (ref 3.87–5.11)
RDW: 15.2 % (ref 11.5–15.5)
WBC: 10.7 10*3/uL — ABNORMAL HIGH (ref 4.0–10.5)

## 2016-02-04 LAB — URINALYSIS, ROUTINE W REFLEX MICROSCOPIC
Bilirubin Urine: NEGATIVE
GLUCOSE, UA: NEGATIVE mg/dL
Hgb urine dipstick: NEGATIVE
KETONES UR: NEGATIVE mg/dL
LEUKOCYTES UA: NEGATIVE
NITRITE: NEGATIVE
PH: 7 (ref 5.0–8.0)
PROTEIN: NEGATIVE mg/dL
Specific Gravity, Urine: 1.001 — ABNORMAL LOW (ref 1.005–1.030)

## 2016-02-04 LAB — LIPASE, BLOOD: Lipase: 17 U/L (ref 11–51)

## 2016-02-04 MED ORDER — SODIUM CHLORIDE 0.9 % IV SOLN
INTRAVENOUS | Status: DC
  Administered 2016-02-04: 22:00:00 via INTRAVENOUS

## 2016-02-04 MED ORDER — SODIUM CHLORIDE 0.9 % IV BOLUS (SEPSIS)
250.0000 mL | Freq: Once | INTRAVENOUS | Status: AC
  Administered 2016-02-04: 250 mL via INTRAVENOUS

## 2016-02-04 MED ORDER — ONDANSETRON 4 MG PO TBDP: 4.0000 mg | ORAL_TABLET | Freq: Three times a day (TID) | ORAL | 1 refills | Status: DC | PRN

## 2016-02-04 MED ORDER — HYDROMORPHONE HCL 4 MG PO TABS: 4.0000 mg | ORAL_TABLET | Freq: Four times a day (QID) | ORAL | 0 refills | Status: DC | PRN

## 2016-02-04 MED ORDER — HYDROMORPHONE HCL 1 MG/ML IJ SOLN
1.0000 mg | Freq: Once | INTRAMUSCULAR | Status: AC
  Administered 2016-02-04: 1 mg via INTRAVENOUS
  Filled 2016-02-04: qty 1

## 2016-02-04 MED ORDER — ONDANSETRON HCL 4 MG/2ML IJ SOLN
4.0000 mg | Freq: Once | INTRAMUSCULAR | Status: AC
  Administered 2016-02-04: 4 mg via INTRAVENOUS
  Filled 2016-02-04: qty 2

## 2016-02-04 NOTE — Discharge Instructions (Signed)
Follow-up with urology. Return for any new or worse symptoms. Take the pain medication as needed. Take antinausea medicine as needed.

## 2016-02-04 NOTE — ED Provider Notes (Signed)
[facility] DEPT Provider Note   CSN: [id number] Arrival date & time: 02/04/16  1925  By signing my name below, I, Sandra [name], attest that this documentation has been prepared under the direction and in the presence of Sandra Sorrow, MD. Electronically Signed: Hilbert [name], Scribe. 02/04/16. 9:33 PM. History   Chief Complaint Chief Complaint  Patient presents with  . Emesis  . Chemotherapy    HPI Sandra [name] is a 41 y.o. female. She was recently seen in the ED for vomiting on 02/01/2016 due to her weekly chemo treatments. She presents today with the same symptoms. She also reports associated generalized body aches. She states that she is on Chemo every Friday and these symptoms are normal. She reports vomiting x 30 today. She took Zofran today with no significant improvement. She has her next Chemo treatment on 02/07/2016. She reports not taking anything for her pain at home.  The history is provided by the patient. No language interpreter was used.  Emesis   The current episode started more than 2 days ago. The problem occurs more than 10 times per day. The problem has been gradually worsening. There has been no fever. Associated symptoms include abdominal pain and diarrhea. Pertinent negatives include no cough, no fever and no headaches.    Past Medical History:  Diagnosis Date  . ADHD (attention deficit hyperactivity disorder)   . Allergic rhinitis   . Anemia    hx of  . Bipolar 1 disorder ([place])   . Bladder tumor   . Cancer ([place])   . Dizziness   . Full dentures   . GERD (gastroesophageal reflux disease)   . Gross hematuria   . Hyperlipidemia   . Type 2 diabetes mellitus ([place])   . Urgency of urination    dysuria, sui  . Wears glasses     Patient Active Problem List   Diagnosis Date Noted  . Postoperative anemia due to acute blood loss 11/04/2015  . Bladder cancer ([place]) 10/31/2015  . CARPAL TUNNEL SYNDROME 10/02/2009    Past Surgical History:    Procedure Laterality Date  . CYSTOSCOPY N/A 10/03/2015   Procedure: CYSTOSCOPY;  Surgeon: Irine Seal, MD;  Location: [facility];  Service: Urology;  Laterality: N/A;  . CYSTOSCOPY WITH STENT PLACEMENT Right 10/31/2015   Procedure: CYSTOSCOPY WITH ATTEMPTED RIGHT URETERAL OPENING;  Surgeon: Irine Seal, MD;  Location: [facility];  Service: Urology;  Laterality: Right;  . MULTIPLE TOOTH EXTRACTIONS  2015  . OVARIAN CYST REMOVAL Right 2004 approx  . TONSILLECTOMY  12/30/2004  . TOTAL ABDOMINAL HYSTERECTOMY W/ BILATERAL SALPINGOOPHORECTOMY  05/16/2004  . TRANSURETHRAL RESECTION OF BLADDER TUMOR N/A 10/03/2015   Procedure: TRANSURETHRAL RESECTION OF BLADDER TUMOR (TURBT);  Surgeon: Irine Seal, MD;  Location: [facility];  Service: Urology;  Laterality: N/A;  . TRANSURETHRAL RESECTION OF BLADDER TUMOR N/A 10/31/2015   Procedure: RE-STAGINGG TRANSURETHRAL RESECTION OF BLADDER TUMOR (TURBT);  Surgeon: Irine Seal, MD;  Location: [facility];  Service: Urology;  Laterality: N/A;    OB History    Gravida Para Term Preterm AB Living   2         2   SAB TAB Ectopic Multiple Live Births                   Home Medications    Prior to Admission medications   Medication Sig Start Date End Date Taking? Authorizing Provider  ALPRAZolam Duanne Moron) 0.25 MG tablet Take  0.25 mg by mouth 3 (three) times daily. 10/14/15  Yes Historical Provider, MD  benzonatate (TESSALON) 100 MG capsule Take 1 capsule by mouth 3 (three) times daily as needed for cough. 10/16/15  Yes Historical Provider, MD  cetirizine (ZYRTEC) 10 MG tablet Take 1 tablet by mouth every morning.  10/09/14  Yes Historical Provider, MD  EQUETRO 300 MG CP12 Take 1 capsule by mouth 2 (two) times daily. 10/24/14  Yes Historical Provider, MD  ferrous sulfate 325 (65 FE) MG tablet Take 1 tablet (325 mg total) by mouth 3 (three) times daily with meals. 11/04/15  Yes Irine Seal, MD  FLUoxetine  (PROZAC) 20 MG capsule Take 10 mg by mouth at bedtime.  10/27/14  Yes Historical Provider, MD  fluticasone (FLONASE) 50 MCG/ACT nasal spray Place 2 sprays into both nostrils daily. 03/28/13  Yes Elisha Headland, FNP  LATUDA 40 MG TABS tablet Take 40 mg by mouth at bedtime. 10/16/15  Yes Historical Provider, MD  lovastatin (MEVACOR) 40 MG tablet Take 40 mg by mouth at bedtime.   Yes Historical Provider, MD  metFORMIN (GLUCOPHAGE) 500 MG tablet Take 500 mg by mouth 2 (two) times daily with a meal.    Yes Historical Provider, MD  montelukast (SINGULAIR) 10 MG tablet Take 10 mg by mouth at bedtime. 10/23/15  Yes Historical Provider, MD  pantoprazole (PROTONIX) 40 MG tablet Take 1 tablet by mouth every evening.  10/09/14  Yes Historical Provider, MD  PROAIR HFA 108 (90 Base) MCG/ACT inhaler Inhale 1-2 puffs into the lungs every 4 (four) hours as needed for shortness of breath. 10/20/15  Yes Historical Provider, MD  VYVANSE 70 MG capsule Take 70 mg by mouth daily. 10/14/15  Yes Historical Provider, MD  HYDROcodone-acetaminophen (NORCO) 5-325 MG tablet Take 1 tablet by mouth every 6 (six) hours as needed for moderate pain. Patient not taking: Reported on 02/04/2016 10/31/15   Irine Seal, MD  HYDROmorphone (DILAUDID) 4 MG tablet Take 1 tablet (4 mg total) by mouth every 6 (six) hours as needed for severe pain. 02/04/16   Sandra Sorrow, MD  ondansetron (ZOFRAN ODT) 4 MG disintegrating tablet Take 1 tablet (4 mg total) by mouth every 8 (eight) hours as needed. 02/04/16   Sandra Sorrow, MD  ondansetron (ZOFRAN) 4 MG tablet Take 1 tablet (4 mg total) by mouth every 6 (six) hours. Patient not taking: Reported on 02/04/2016 02/01/16   Sandra Essex, MD    Family History Family History  Problem Relation Age of Onset  . Hypertension Mother   . Cancer Mother   . Breast cancer Maternal Aunt   . Lung cancer Maternal Uncle   . Cancer Other     Social History Social History  Substance Use Topics  . Smoking status:  Current Every Day Smoker    Packs/day: 0.50    Types: Cigarettes  . Smokeless tobacco: Former Systems developer    Quit date: 09/29/2015     Comment: down to 1/2pp2d from 2 ppd on 09-24-2015  . Alcohol use No     Allergies   Benazepril; Metronidazole; Adhesive [tape]; Codeine; Loratadine; and Penicillins   Review of Systems Review of Systems  Constitutional: Negative for fever.  HENT: Negative for rhinorrhea.   Eyes: Negative for visual disturbance.  Respiratory: Negative for cough and shortness of breath.   Cardiovascular: Negative for chest pain.  Gastrointestinal: Positive for abdominal pain, diarrhea, nausea and vomiting.  Genitourinary: Negative for difficulty urinating and hematuria.  Musculoskeletal: Negative for joint swelling.  Skin:  Negative for rash.  Neurological: Negative for headaches.  Hematological: Does not bruise/bleed easily.  All other systems reviewed and are negative.    Physical Exam Updated Vital Signs BP 145/79 (BP Location: Left Arm)   Pulse 82   Temp 98.3 F (36.8 C) (Oral)   Resp 16   Ht 4\' 11"  (1.499 m)   Wt 88.9 kg   SpO2 100%   BMI 39.59 kg/m   Physical Exam  Lungs clear bila. Heart normal. No murmurs. Bowels sounds decreased. Belly non tender. No swelling in ankles. Sclera normal. Eyes tracking normal . Mucous membranes are a little dry ED Treatments / Results  DIAGNOSTIC STUDIES: Oxygen Saturation is 100% on RA, normal by my interpretation.    COORDINATION OF CARE: 9:33 PM Discussed treatment plan with pt at bedside and pt agreed to plan.  Labs (all labs ordered are listed, but only abnormal results are displayed) Labs Reviewed  URINALYSIS, ROUTINE W REFLEX MICROSCOPIC - Abnormal; Notable for the following:       Result Value   Color, Urine STRAW (*)    Specific Gravity, Urine 1.001 (*)    All other components within normal limits  COMPREHENSIVE METABOLIC PANEL - Abnormal; Notable for the following:    Sodium 132 (*)    All other  components within normal limits  CBC WITH DIFFERENTIAL/PLATELET - Abnormal; Notable for the following:    WBC 10.7 (*)    MCV 75.3 (*)    MCH 23.7 (*)    Platelets 401 (*)    All other components within normal limits  LIPASE, BLOOD   Results for orders placed or performed during the hospital encounter of 02/04/16  Urinalysis, Routine w reflex microscopic  Result Value Ref Range   Color, Urine STRAW (A) YELLOW   APPearance CLEAR CLEAR   Specific Gravity, Urine 1.001 (L) 1.005 - 1.030   pH 7.0 5.0 - 8.0   Glucose, UA NEGATIVE NEGATIVE mg/dL   Hgb urine dipstick NEGATIVE NEGATIVE   Bilirubin Urine NEGATIVE NEGATIVE   Ketones, ur NEGATIVE NEGATIVE mg/dL   Protein, ur NEGATIVE NEGATIVE mg/dL   Nitrite NEGATIVE NEGATIVE   Leukocytes, UA NEGATIVE NEGATIVE  Comprehensive metabolic panel  Result Value Ref Range   Sodium 132 (L) 135 - 145 mmol/L   Potassium 4.2 3.5 - 5.1 mmol/L   Chloride 101 101 - 111 mmol/L   CO2 22 22 - 32 mmol/L   Glucose, Bld 99 65 - 99 mg/dL   BUN 10 6 - 20 mg/dL   Creatinine, Ser 0.78 0.44 - 1.00 mg/dL   Calcium 9.4 8.9 - 10.3 mg/dL   Total Protein 7.8 6.5 - 8.1 g/dL   Albumin 3.7 3.5 - 5.0 g/dL   AST 21 15 - 41 U/L   ALT 21 14 - 54 U/L   Alkaline Phosphatase 115 38 - 126 U/L   Total Bilirubin 0.3 0.3 - 1.2 mg/dL   GFR calc non Af Amer >60 >60 mL/min   GFR calc Af Amer >60 >60 mL/min   Anion gap 9 5 - 15  Lipase, blood  Result Value Ref Range   Lipase 17 11 - 51 U/L  CBC with Differential/Platelet  Result Value Ref Range   WBC 10.7 (H) 4.0 - 10.5 K/uL   RBC 5.11 3.87 - 5.11 MIL/uL   Hemoglobin 12.1 12.0 - 15.0 g/dL   HCT 38.5 36.0 - 46.0 %   MCV 75.3 (L) 78.0 - 100.0 fL   MCH 23.7 (L) 26.0 -  34.0 pg   MCHC 31.4 30.0 - 36.0 g/dL   RDW 15.2 11.5 - 15.5 %   Platelets 401 (H) 150 - 400 K/uL   Neutrophils Relative % 59 %   Neutro Abs 6.2 1.7 - 7.7 K/uL   Lymphocytes Relative 35 %   Lymphs Abs 3.8 0.7 - 4.0 K/uL   Monocytes Relative 5 %   Monocytes  Absolute 0.6 0.1 - 1.0 K/uL   Eosinophils Relative 1 %   Eosinophils Absolute 0.2 0.0 - 0.7 K/uL   Basophils Relative 0 %   Basophils Absolute 0.0 0.0 - 0.1 K/uL     EKG  EKG Interpretation None       Radiology No results found.  Procedures Procedures (including critical care time)  Medications Ordered in ED Medications  0.9 %  sodium chloride infusion ( Intravenous New Bag/Given 02/04/16 2203)  sodium chloride 0.9 % bolus 250 mL (0 mLs Intravenous Stopped 02/04/16 2256)  ondansetron (ZOFRAN) injection 4 mg (4 mg Intravenous Given 02/04/16 2201)  HYDROmorphone (DILAUDID) injection 1 mg (1 mg Intravenous Given 02/04/16 2202)     Initial Impression / Assessment and Plan / ED Course  I have reviewed the triage vital signs and the nursing notes.  Pertinent labs & imaging results that were available during my care of the patient were reviewed by me and considered in my medical decision making (see chart for details).   symptoms most likely consistent with side effects from the chemotherapy. Labs without significant abnormalities here today actually show some improvement from her evaluation 3 days ago. Recommend close follow-up with urology. Patient improved here with hydromorphone. Will give some oral hydromorphone at home. As well as continuing with the Zofran. Patient shows improvement here nontoxic no acute distress. Patient well enough for discharge home.  Final Clinical Impressions(s) / ED Diagnoses   Final diagnoses:  Adverse effect of drug, subsequent encounter    New Prescriptions New Prescriptions   HYDROMORPHONE (DILAUDID) 4 MG TABLET    Take 1 tablet (4 mg total) by mouth every 6 (six) hours as needed for severe pain.   ONDANSETRON (ZOFRAN ODT) 4 MG DISINTEGRATING TABLET    Take 1 tablet (4 mg total) by mouth every 8 (eight) hours as needed.   I personally performed the services described in this documentation, which was scribed in my presence. The recorded  information has been reviewed and is accurate.      Sandra Sorrow, MD 02/05/16 0000

## 2016-02-04 NOTE — ED Triage Notes (Signed)
Took Chemo on Friday, pt having nausea, vomiting, using medication without relief. Complaining of bone pain

## 2016-02-04 NOTE — ED Notes (Signed)
Denies pain medication

## 2016-02-04 NOTE — ED Notes (Signed)
ED Provider at bedside. 

## 2016-02-04 NOTE — ED Notes (Signed)
Last vomited back here and about 30 x in last 24 hrs. Per pt.

## 2016-02-05 MED ORDER — HYDROMORPHONE HCL 1 MG/ML IJ SOLN
1.0000 mg | Freq: Once | INTRAMUSCULAR | Status: AC
  Administered 2016-02-05: 1 mg via INTRAVENOUS
  Filled 2016-02-05: qty 1

## 2016-02-05 NOTE — ED Notes (Signed)
Pt wheeled to waiting room by Luis Abed. Pt verbalized understanding discharge instructions.

## 2016-02-07 ENCOUNTER — Ambulatory Visit (INDEPENDENT_AMBULATORY_CARE_PROVIDER_SITE_OTHER): Payer: Medicaid Other | Admitting: Urology

## 2016-02-07 DIAGNOSIS — C670 Malignant neoplasm of trigone of bladder: Secondary | ICD-10-CM | POA: Diagnosis not present

## 2016-02-07 LAB — CULTURE, BLOOD (ROUTINE X 2)
CULTURE: NO GROWTH
Culture: NO GROWTH

## 2016-02-18 ENCOUNTER — Ambulatory Visit (INDEPENDENT_AMBULATORY_CARE_PROVIDER_SITE_OTHER): Payer: Medicaid Other | Admitting: Urology

## 2016-02-18 DIAGNOSIS — C670 Malignant neoplasm of trigone of bladder: Secondary | ICD-10-CM | POA: Diagnosis not present

## 2016-02-28 ENCOUNTER — Ambulatory Visit (INDEPENDENT_AMBULATORY_CARE_PROVIDER_SITE_OTHER): Payer: Medicaid Other | Admitting: Urology

## 2016-02-28 ENCOUNTER — Encounter (HOSPITAL_COMMUNITY): Payer: Self-pay | Admitting: *Deleted

## 2016-02-28 ENCOUNTER — Emergency Department (HOSPITAL_COMMUNITY)
Admission: EM | Admit: 2016-02-28 | Discharge: 2016-02-29 | Disposition: A | Discharge status: Home / Self Care | Payer: Medicaid Other | Attending: Emergency Medicine | Admitting: Emergency Medicine

## 2016-02-28 DIAGNOSIS — F909 Attention-deficit hyperactivity disorder, unspecified type: Secondary | ICD-10-CM | POA: Insufficient documentation

## 2016-02-28 DIAGNOSIS — F17210 Nicotine dependence, cigarettes, uncomplicated: Secondary | ICD-10-CM | POA: Diagnosis not present

## 2016-02-28 DIAGNOSIS — E119 Type 2 diabetes mellitus without complications: Secondary | ICD-10-CM | POA: Diagnosis not present

## 2016-02-28 DIAGNOSIS — C670 Malignant neoplasm of trigone of bladder: Secondary | ICD-10-CM | POA: Diagnosis not present

## 2016-02-28 DIAGNOSIS — Z79899 Other long term (current) drug therapy: Secondary | ICD-10-CM | POA: Insufficient documentation

## 2016-02-28 DIAGNOSIS — Z7984 Long term (current) use of oral hypoglycemic drugs: Secondary | ICD-10-CM | POA: Insufficient documentation

## 2016-02-28 DIAGNOSIS — Z8551 Personal history of malignant neoplasm of bladder: Secondary | ICD-10-CM | POA: Insufficient documentation

## 2016-02-28 DIAGNOSIS — T451X5A Adverse effect of antineoplastic and immunosuppressive drugs, initial encounter: Secondary | ICD-10-CM | POA: Diagnosis not present

## 2016-02-28 DIAGNOSIS — R1110 Vomiting, unspecified: Secondary | ICD-10-CM | POA: Diagnosis not present

## 2016-02-28 NOTE — ED Triage Notes (Signed)
Pt states she had chemo today at 3pm and started vomiting at 7pm;  Pt states she took zofran with no relief; pt is being treated for bladder cancer

## 2016-02-29 LAB — CBC WITH DIFFERENTIAL/PLATELET
BASOS ABS: 0 10*3/uL (ref 0.0–0.1)
BASOS PCT: 0 %
Eosinophils Absolute: 0.2 10*3/uL (ref 0.0–0.7)
Eosinophils Relative: 1 %
HCT: 36.1 % (ref 36.0–46.0)
Hemoglobin: 11.4 g/dL — ABNORMAL LOW (ref 12.0–15.0)
LYMPHS PCT: 31 %
Lymphs Abs: 3.7 10*3/uL (ref 0.7–4.0)
MCH: 23.8 pg — ABNORMAL LOW (ref 26.0–34.0)
MCHC: 31.6 g/dL (ref 30.0–36.0)
MCV: 75.2 fL — ABNORMAL LOW (ref 78.0–100.0)
MONO ABS: 1.1 10*3/uL — AB (ref 0.1–1.0)
Monocytes Relative: 9 %
NEUTROS PCT: 59 %
Neutro Abs: 7.2 10*3/uL (ref 1.7–7.7)
Platelets: 353 10*3/uL (ref 150–400)
RBC: 4.8 MIL/uL (ref 3.87–5.11)
RDW: 16.1 % — AB (ref 11.5–15.5)
WBC: 12.3 10*3/uL — AB (ref 4.0–10.5)

## 2016-02-29 LAB — BASIC METABOLIC PANEL
ANION GAP: 7 (ref 5–15)
BUN: 9 mg/dL (ref 6–20)
CALCIUM: 8.6 mg/dL — AB (ref 8.9–10.3)
CO2: 24 mmol/L (ref 22–32)
Chloride: 104 mmol/L (ref 101–111)
Creatinine, Ser: 0.76 mg/dL (ref 0.44–1.00)
Glucose, Bld: 159 mg/dL — ABNORMAL HIGH (ref 65–99)
POTASSIUM: 3.4 mmol/L — AB (ref 3.5–5.1)
SODIUM: 135 mmol/L (ref 135–145)

## 2016-02-29 MED ORDER — FENTANYL CITRATE (PF) 100 MCG/2ML IJ SOLN
100.0000 ug | Freq: Once | INTRAMUSCULAR | Status: AC
  Administered 2016-02-29: 100 ug via INTRAVENOUS
  Filled 2016-02-29: qty 2

## 2016-02-29 MED ORDER — HYDROMORPHONE HCL 1 MG/ML IJ SOLN
1.0000 mg | Freq: Once | INTRAMUSCULAR | Status: AC
  Administered 2016-02-29: 1 mg via INTRAVENOUS
  Filled 2016-02-29: qty 1

## 2016-02-29 MED ORDER — ONDANSETRON 8 MG PO TBDP: ORAL_TABLET | ORAL | 0 refills | Status: DC

## 2016-02-29 MED ORDER — ONDANSETRON HCL 4 MG/2ML IJ SOLN
4.0000 mg | Freq: Once | INTRAMUSCULAR | Status: AC
  Administered 2016-02-29: 4 mg via INTRAVENOUS
  Filled 2016-02-29: qty 2

## 2016-02-29 MED ORDER — SODIUM CHLORIDE 0.9 % IV BOLUS (SEPSIS)
1000.0000 mL | Freq: Once | INTRAVENOUS | Status: AC
  Administered 2016-02-29: 1000 mL via INTRAVENOUS

## 2016-02-29 NOTE — ED Provider Notes (Signed)
[facility] DEPT Provider Note   CSN: [id number] Arrival date & time: 02/28/16  2353  By signing my name below, I, [name], attest that this documentation has been prepared under the direction and in the presence of Ripley Fraise, MD. Electronically Signed: [name], Scribe. 02/29/16. 12:21 AM.   History   Chief Complaint Chief Complaint  Patient presents with  . Emesis    HPI Sandra [name] is a 41 y.o. female.  The history is provided by the patient. No language interpreter was used.  Emesis   This is a recurrent problem. The current episode started 12 to 24 hours ago. The problem occurs continuously. The problem has been gradually worsening. The emesis has an appearance of stomach contents. There has been no fever. Associated symptoms include abdominal pain, arthralgias, cough and myalgias. Pertinent negatives include no fever and no headaches. Associated symptoms comments: Chemo today. Hernia. Generalized body aches.. Risk factors: Chemo every friday.    Past Medical History:  Diagnosis Date  . ADHD (attention deficit hyperactivity disorder)   . Allergic rhinitis   . Anemia    hx of  . Bipolar 1 disorder ([place])   . Bladder tumor   . Cancer ([place])   . Dizziness   . Full dentures   . GERD (gastroesophageal reflux disease)   . Gross hematuria   . Hyperlipidemia   . Type 2 diabetes mellitus ([place])   . Urgency of urination    dysuria, sui  . Wears glasses     Patient Active Problem List   Diagnosis Date Noted  . Postoperative anemia due to acute blood loss 11/04/2015  . Bladder cancer ([place]) 10/31/2015  . CARPAL TUNNEL SYNDROME 10/02/2009    Past Surgical History:  Procedure Laterality Date  . CYSTOSCOPY N/A 10/03/2015   Procedure: CYSTOSCOPY;  Surgeon: Irine Seal, MD;  Location: [facility];  Service: Urology;  Laterality: N/A;  . CYSTOSCOPY WITH STENT PLACEMENT Right 10/31/2015   Procedure: CYSTOSCOPY WITH ATTEMPTED RIGHT URETERAL OPENING;   Surgeon: Irine Seal, MD;  Location: [facility];  Service: Urology;  Laterality: Right;  . MULTIPLE TOOTH EXTRACTIONS  2015  . OVARIAN CYST REMOVAL Right 2004 approx  . TONSILLECTOMY  12/30/2004  . TOTAL ABDOMINAL HYSTERECTOMY W/ BILATERAL SALPINGOOPHORECTOMY  05/16/2004  . TRANSURETHRAL RESECTION OF BLADDER TUMOR N/A 10/03/2015   Procedure: TRANSURETHRAL RESECTION OF BLADDER TUMOR (TURBT);  Surgeon: Irine Seal, MD;  Location: [facility];  Service: Urology;  Laterality: N/A;  . TRANSURETHRAL RESECTION OF BLADDER TUMOR N/A 10/31/2015   Procedure: RE-STAGINGG TRANSURETHRAL RESECTION OF BLADDER TUMOR (TURBT);  Surgeon: Irine Seal, MD;  Location: [facility];  Service: Urology;  Laterality: N/A;    OB History    Gravida Para Term Preterm AB Living   2         2   SAB TAB Ectopic Multiple Live Births                   Home Medications    Prior to Admission medications   Medication Sig Start Date End Date Taking? Authorizing Provider  ALPRAZolam (XANAX) 0.25 MG tablet Take 0.25 mg by mouth 3 (three) times daily. 10/14/15   Historical Provider, MD  benzonatate (TESSALON) 100 MG capsule Take 1 capsule by mouth 3 (three) times daily as needed for cough. 10/16/15   Historical Provider, MD  cetirizine (ZYRTEC) 10 MG tablet Take 1 tablet by mouth every morning.  10/09/14   Historical  Provider, MD  EQUETRO 300 MG CP12 Take 1 capsule by mouth 2 (two) times daily. 10/24/14   Historical Provider, MD  ferrous sulfate 325 (65 FE) MG tablet Take 1 tablet (325 mg total) by mouth 3 (three) times daily with meals. 11/04/15   Irine Seal, MD  FLUoxetine (PROZAC) 20 MG capsule Take 10 mg by mouth at bedtime.  10/27/14   Historical Provider, MD  fluticasone (FLONASE) 50 MCG/ACT nasal spray Place 2 sprays into both nostrils daily. 03/28/13   Elisha Headland, FNP  HYDROcodone-acetaminophen (NORCO) 5-325 MG tablet Take 1 tablet by mouth every 6 (six) hours as needed for  moderate pain. Patient not taking: Reported on 02/04/2016 10/31/15   Irine Seal, MD  HYDROmorphone (DILAUDID) 4 MG tablet Take 1 tablet (4 mg total) by mouth every 6 (six) hours as needed for severe pain. 02/04/16   Fredia Sorrow, MD  LATUDA 40 MG TABS tablet Take 40 mg by mouth at bedtime. 10/16/15   Historical Provider, MD  lovastatin (MEVACOR) 40 MG tablet Take 40 mg by mouth at bedtime.    Historical Provider, MD  metFORMIN (GLUCOPHAGE) 500 MG tablet Take 500 mg by mouth 2 (two) times daily with a meal.     Historical Provider, MD  montelukast (SINGULAIR) 10 MG tablet Take 10 mg by mouth at bedtime. 10/23/15   Historical Provider, MD  ondansetron (ZOFRAN ODT) 4 MG disintegrating tablet Take 1 tablet (4 mg total) by mouth every 8 (eight) hours as needed. 02/04/16   Fredia Sorrow, MD  ondansetron (ZOFRAN) 4 MG tablet Take 1 tablet (4 mg total) by mouth every 6 (six) hours. Patient not taking: Reported on 02/04/2016 02/01/16   Ezequiel Essex, MD  pantoprazole (PROTONIX) 40 MG tablet Take 1 tablet by mouth every evening.  10/09/14   Historical Provider, MD  PROAIR HFA 108 (90 Base) MCG/ACT inhaler Inhale 1-2 puffs into the lungs every 4 (four) hours as needed for shortness of breath. 10/20/15   Historical Provider, MD  VYVANSE 70 MG capsule Take 70 mg by mouth daily. 10/14/15   Historical Provider, MD    Family History Family History  Problem Relation Age of Onset  . Hypertension Mother   . Cancer Mother   . Breast cancer Maternal Aunt   . Lung cancer Maternal Uncle   . Cancer Other     Social History Social History  Substance Use Topics  . Smoking status: Current Every Day Smoker    Packs/day: 0.50    Types: Cigarettes  . Smokeless tobacco: Former Systems developer    Quit date: 09/29/2015     Comment: down to 1/2pp2d from 2 ppd on 09-24-2015  . Alcohol use No     Allergies   Benazepril; Metronidazole; Adhesive [tape]; Codeine; Loratadine; and Penicillins   Review of Systems Review of  Systems  Constitutional: Negative for fever.  Respiratory: Positive for cough.   Gastrointestinal: Positive for abdominal pain and vomiting.  Genitourinary: Positive for difficulty urinating.  Musculoskeletal: Positive for arthralgias and myalgias.  Neurological: Negative for headaches.  All other systems reviewed and are negative.    Physical Exam Updated Vital Signs BP 147/83 (BP Location: Right Arm)   Pulse 70   Temp 97.7 F (36.5 C) (Oral)   Resp 20   Ht 4\' 11"  (1.499 m)   Wt 190 lb (86.2 kg)   SpO2 100%   BMI 38.38 kg/m   Physical Exam  CONSTITUTIONAL: Appears older than stated age, no distress noted HEAD: Normocephalic/atraumatic EYES: EOMI/PERRL  ENMT: Mucous membranes dry NECK: supple no meningeal signs SPINE/BACK:entire spine nontender CV: S1/S2 noted, no murmurs/rubs/gallops noted LUNGS: Lungs are clear to auscultation bilaterally, no apparent distress ABDOMEN: soft, nontender, no rebound or guarding, bowel sounds noted throughout abdomen GU:no cva tenderness NEURO: Pt is awake/alert/appropriate, moves all extremitiesx4.  No facial droop.   EXTREMITIES: pulses normal/equal, full ROM SKIN: warm, color normal PSYCH: no abnormalities of mood noted, alert and oriented to situation   ED Treatments / Results   DIAGNOSTIC STUDIES: Oxygen Saturation is 100% on room air, normal by my interpretation.    COORDINATION OF CARE: 12:21 AM Discussed treatment plan with pt at bedside and pt agreed to plan.  Labs (all labs ordered are listed, but only abnormal results are displayed) Labs Reviewed  BASIC METABOLIC PANEL - Abnormal; Notable for the following:       Result Value   Potassium 3.4 (*)    Glucose, Bld 159 (*)    Calcium 8.6 (*)    All other components within normal limits  CBC WITH DIFFERENTIAL/PLATELET - Abnormal; Notable for the following:    WBC 12.3 (*)    Hemoglobin 11.4 (*)    MCV 75.2 (*)    MCH 23.8 (*)    RDW 16.1 (*)    Monocytes Absolute  1.1 (*)    All other components within normal limits    EKG  EKG Interpretation None       Radiology No results found.  Procedures Procedures (including critical care time)  Medications Ordered in ED Medications  ondansetron (ZOFRAN) injection 4 mg (not administered)  sodium chloride 0.9 % bolus 1,000 mL (0 mLs Intravenous Stopped 02/29/16 0114)  ondansetron (ZOFRAN) injection 4 mg (4 mg Intravenous Given 02/29/16 0027)  fentaNYL (SUBLIMAZE) injection 100 mcg (100 mcg Intravenous Given 02/29/16 0027)  HYDROmorphone (DILAUDID) injection 1 mg (1 mg Intravenous Given 02/29/16 0114)     Initial Impression / Assessment and Plan / ED Course  I have reviewed the triage vital signs and the nursing notes.  Pertinent labs & imaging results that were available during my care of the patient were reviewed by me and considered in my medical decision making (see chart for details).     Pt improved Taking PO Abdomen soft on exam She reports she has chemo infused via foley catheter into bladder at urologist office for bladder CA and this is her typical reaction She is now producing urine here in the ED without issue Labs reassuring Will d/c home She has run out of zofran, will refill   Final Clinical Impressions(s) / ED Diagnoses   Final diagnoses:  Chemotherapy-induced vomiting    New Prescriptions New Prescriptions   ONDANSETRON (ZOFRAN ODT) 8 MG DISINTEGRATING TABLET    8mg  ODT q4 hours prn nausea   I personally performed the services described in this documentation, which was scribed in my presence. The recorded information has been reviewed and is accurate.      Ripley Fraise, MD 02/29/16 [phone]

## 2016-02-29 NOTE — ED Notes (Signed)
Pt upset due to edp not prescribing additional pain medication. I informed pt she needed to contact the doctor that prescribed the prior medication.

## 2016-02-29 NOTE — ED Notes (Signed)
Pt given ginger ale.

## 2016-03-03 ENCOUNTER — Other Ambulatory Visit: Payer: Self-pay | Admitting: Radiology

## 2016-03-03 DIAGNOSIS — M25572 Pain in left ankle and joints of left foot: Secondary | ICD-10-CM

## 2016-03-04 ENCOUNTER — Encounter: Payer: Self-pay | Admitting: Orthopaedic Surgery

## 2016-03-04 ENCOUNTER — Ambulatory Visit (INDEPENDENT_AMBULATORY_CARE_PROVIDER_SITE_OTHER): Payer: Medicaid Other | Admitting: Orthopaedic Surgery

## 2016-03-04 ENCOUNTER — Ambulatory Visit (HOSPITAL_COMMUNITY)
Admission: RE | Admit: 2016-03-04 | Discharge: 2016-03-04 | Disposition: A | Discharge status: Home / Self Care | Payer: Medicaid Other | Source: Ambulatory Visit | Attending: Orthopaedic Surgery | Admitting: Orthopaedic Surgery

## 2016-03-04 VITALS — BP 115/79 | HR 83 | Temp 97.3°F | Resp 18 | Ht 61.0 in | Wt 188.0 lb

## 2016-03-04 DIAGNOSIS — M79672 Pain in left foot: Secondary | ICD-10-CM

## 2016-03-04 DIAGNOSIS — M7732 Calcaneal spur, left foot: Secondary | ICD-10-CM | POA: Insufficient documentation

## 2016-03-04 DIAGNOSIS — M25572 Pain in left ankle and joints of left foot: Secondary | ICD-10-CM

## 2016-03-04 DIAGNOSIS — F17210 Nicotine dependence, cigarettes, uncomplicated: Secondary | ICD-10-CM

## 2016-03-04 MED ORDER — DICLOFENAC SODIUM 75 MG PO TBEC: 75.0000 mg | DELAYED_RELEASE_TABLET | Freq: Two times a day (BID) | ORAL | 2 refills | Status: DC

## 2016-03-04 NOTE — Progress Notes (Signed)
Patient Sandra [name], female DOB:06-02-1975, 41 y.o. [id number]  Chief Complaint  Patient presents with  . Foot Swelling    Left foot pain for 1 year, no injury. Referred by [facility]. Xrays at [facility].    HPI  Sandra [name] is a 41 y.o. female who has new problem of pain of the left lateral foot.  She has seen a podiatrist about six months ago for this.  She has an area of tenderness and callus near the base of the fifth metatarsal plantar surface. She has no redness, no trauma.  She tried corn pad which did not help. HPI  Body mass index is 35.52 kg/m.  ROS  Review of Systems  HENT: Negative for congestion.   Respiratory: Negative for cough and shortness of breath.   Cardiovascular: Negative for chest pain and leg swelling.  Endocrine: Positive for cold intolerance.  Musculoskeletal: Positive for arthralgias.  Allergic/Immunologic: Positive for environmental allergies.    Past Medical History:  Diagnosis Date  . ADHD (attention deficit hyperactivity disorder)   . Allergic rhinitis   . Anemia    hx of  . Bipolar 1 disorder ([place])   . Bladder tumor   . Cancer ([place])   . Dizziness   . Full dentures   . GERD (gastroesophageal reflux disease)   . Gross hematuria   . Hyperlipidemia   . Type 2 diabetes mellitus ([place])   . Urgency of urination    dysuria, sui  . Wears glasses     Past Surgical History:  Procedure Laterality Date  . CYSTOSCOPY N/A 10/03/2015   Procedure: CYSTOSCOPY;  Surgeon: Irine Seal, MD;  Location: [facility];  Service: Urology;  Laterality: N/A;  . CYSTOSCOPY WITH STENT PLACEMENT Right 10/31/2015   Procedure: CYSTOSCOPY WITH ATTEMPTED RIGHT URETERAL OPENING;  Surgeon: Irine Seal, MD;  Location: [facility];  Service: Urology;  Laterality: Right;  . MULTIPLE TOOTH EXTRACTIONS  2015  . OVARIAN CYST REMOVAL Right 2004 approx  . TONSILLECTOMY  12/30/2004  . TOTAL ABDOMINAL HYSTERECTOMY W/  BILATERAL SALPINGOOPHORECTOMY  05/16/2004  . TRANSURETHRAL RESECTION OF BLADDER TUMOR N/A 10/03/2015   Procedure: TRANSURETHRAL RESECTION OF BLADDER TUMOR (TURBT);  Surgeon: Irine Seal, MD;  Location: [facility];  Service: Urology;  Laterality: N/A;  . TRANSURETHRAL RESECTION OF BLADDER TUMOR N/A 10/31/2015   Procedure: RE-STAGINGG TRANSURETHRAL RESECTION OF BLADDER TUMOR (TURBT);  Surgeon: Irine Seal, MD;  Location: [facility];  Service: Urology;  Laterality: N/A;    Family History  Problem Relation Age of Onset  . Hypertension Mother   . Cancer Mother   . Breast cancer Maternal Aunt   . Lung cancer Maternal Uncle   . Cancer Other     Social History Social History  Substance Use Topics  . Smoking status: Current Every Day Smoker    Packs/day: 0.50    Types: Cigarettes  . Smokeless tobacco: Former Systems developer    Quit date: 09/29/2015     Comment: down to 1/2pp2d from 2 ppd on 09-24-2015  . Alcohol use No      Current Outpatient Prescriptions  Medication Sig Dispense Refill  . ALPRAZolam (XANAX) 0.25 MG tablet Take 0.25 mg by mouth 3 (three) times daily.  1  . benzonatate (TESSALON) 100 MG capsule Take 1 capsule by mouth 3 (three) times daily as needed for cough.  1  . cetirizine (ZYRTEC) 10 MG tablet Take 1 tablet by mouth every morning.  2  . diclofenac (VOLTAREN) 75 MG EC tablet Take 1 tablet (75 mg total) by mouth 2 (two) times daily with a meal. 60 tablet 2  . EQUETRO 300 MG CP12 Take 1 capsule by mouth 2 (two) times daily.  0  . ferrous sulfate 325 (65 FE) MG tablet Take 1 tablet (325 mg total) by mouth 3 (three) times daily with meals. 90 tablet 1  . FLUoxetine (PROZAC) 20 MG capsule Take 10 mg by mouth at bedtime.   0  . fluticasone (FLONASE) 50 MCG/ACT nasal spray Place 2 sprays into both nostrils daily. 9.9 g 2  . HYDROcodone-acetaminophen (NORCO) 5-325 MG tablet Take 1 tablet by mouth every 6 (six) hours as needed for moderate pain. (Patient  not taking: Reported on 02/04/2016) 20 tablet 0  . HYDROmorphone (DILAUDID) 4 MG tablet Take 1 tablet (4 mg total) by mouth every 6 (six) hours as needed for severe pain. 20 tablet 0  . LATUDA 40 MG TABS tablet Take 40 mg by mouth at bedtime.  1  . lovastatin (MEVACOR) 40 MG tablet Take 40 mg by mouth at bedtime.    . metFORMIN (GLUCOPHAGE) 500 MG tablet Take 500 mg by mouth 2 (two) times daily with a meal.     . montelukast (SINGULAIR) 10 MG tablet Take 10 mg by mouth at bedtime.  3  . ondansetron (ZOFRAN ODT) 8 MG disintegrating tablet 8mg  ODT q4 hours prn nausea 4 tablet 0  . pantoprazole (PROTONIX) 40 MG tablet Take 1 tablet by mouth every evening.   2  . PROAIR HFA 108 (90 Base) MCG/ACT inhaler Inhale 1-2 puffs into the lungs every 4 (four) hours as needed for shortness of breath.  0  . VYVANSE 70 MG capsule Take 70 mg by mouth daily.  0   No current facility-administered medications for this visit.      Physical Exam  Blood pressure 115/79, pulse 83, temperature 97.3 F (36.3 C), resp. rate 18, height 5\' 1"  (1.549 m), weight 188 lb (85.3 kg).  Constitutional: overall normal hygiene, normal nutrition, well developed, normal grooming, normal body habitus. Assistive device:none  Musculoskeletal: gait and station Limp left, muscle tone and strength are normal, no tremors or atrophy is present.  .  Neurological: coordination overall normal.  Deep tendon reflex/nerve stretch intact.  Sensation normal.  Cranial nerves II-XII intact.   Skin:   Normal overall no scars, lesions, ulcers or rashes. No psoriasis.  Psychiatric: Alert and oriented x 3.  Recent memory intact, remote memory unclear.  Normal mood and affect. Well groomed.  Good eye contact.  Cardiovascular: overall no swelling, no varicosities, no edema bilaterally, normal temperatures of the legs and arms, no clubbing, cyanosis and good capillary refill.  Lymphatic: palpation is normal.  The left plantar foot near the base of  the fifth metatarsal has a prominence and callus.  It is tender but not red.  It is not fluctuant.  I pared the callus area.  I recommended corn pad to be applied at night.  The patient has been educated about the nature of the problem(s) and counseled on treatment options.  The patient appeared to understand what I have discussed and is in agreement with it.  Encounter Diagnoses  Name Primary?  . Left foot pain Yes  . Cigarette nicotine dependence without complication    She smokes and is not willing to stop.  PLAN Call if any problems.  Precautions discussed.  Continue current medications.   Return to  clinic 1 month   Electronically Signed Sanjuana Kava, MD 2/21/20182:53 PM

## 2016-03-09 ENCOUNTER — Emergency Department (HOSPITAL_COMMUNITY): Payer: Medicaid Other

## 2016-03-09 ENCOUNTER — Emergency Department (HOSPITAL_COMMUNITY)
Admission: EM | Admit: 2016-03-09 | Discharge: 2016-03-10 | Disposition: A | Discharge status: Home / Self Care | Payer: Medicaid Other | Attending: Emergency Medicine | Admitting: Emergency Medicine

## 2016-03-09 ENCOUNTER — Encounter (HOSPITAL_COMMUNITY): Payer: Self-pay | Admitting: Emergency Medicine

## 2016-03-09 DIAGNOSIS — R05 Cough: Secondary | ICD-10-CM | POA: Diagnosis present

## 2016-03-09 DIAGNOSIS — R112 Nausea with vomiting, unspecified: Secondary | ICD-10-CM | POA: Diagnosis not present

## 2016-03-09 DIAGNOSIS — E119 Type 2 diabetes mellitus without complications: Secondary | ICD-10-CM | POA: Diagnosis not present

## 2016-03-09 DIAGNOSIS — Z79899 Other long term (current) drug therapy: Secondary | ICD-10-CM | POA: Insufficient documentation

## 2016-03-09 DIAGNOSIS — R0981 Nasal congestion: Secondary | ICD-10-CM | POA: Insufficient documentation

## 2016-03-09 DIAGNOSIS — M791 Myalgia: Secondary | ICD-10-CM | POA: Insufficient documentation

## 2016-03-09 DIAGNOSIS — Z791 Long term (current) use of non-steroidal anti-inflammatories (NSAID): Secondary | ICD-10-CM | POA: Insufficient documentation

## 2016-03-09 DIAGNOSIS — F909 Attention-deficit hyperactivity disorder, unspecified type: Secondary | ICD-10-CM | POA: Insufficient documentation

## 2016-03-09 DIAGNOSIS — Z8551 Personal history of malignant neoplasm of bladder: Secondary | ICD-10-CM | POA: Diagnosis not present

## 2016-03-09 DIAGNOSIS — R059 Cough, unspecified: Secondary | ICD-10-CM

## 2016-03-09 DIAGNOSIS — Z7984 Long term (current) use of oral hypoglycemic drugs: Secondary | ICD-10-CM | POA: Insufficient documentation

## 2016-03-09 DIAGNOSIS — R509 Fever, unspecified: Secondary | ICD-10-CM | POA: Diagnosis not present

## 2016-03-09 DIAGNOSIS — F17210 Nicotine dependence, cigarettes, uncomplicated: Secondary | ICD-10-CM | POA: Diagnosis not present

## 2016-03-09 LAB — BASIC METABOLIC PANEL
ANION GAP: 8 (ref 5–15)
BUN: 14 mg/dL (ref 6–20)
CO2: 25 mmol/L (ref 22–32)
Calcium: 9.1 mg/dL (ref 8.9–10.3)
Chloride: 103 mmol/L (ref 101–111)
Creatinine, Ser: 0.91 mg/dL (ref 0.44–1.00)
Glucose, Bld: 167 mg/dL — ABNORMAL HIGH (ref 65–99)
Potassium: 3.9 mmol/L (ref 3.5–5.1)
Sodium: 136 mmol/L (ref 135–145)

## 2016-03-09 LAB — CBC WITH DIFFERENTIAL/PLATELET
BASOS ABS: 0 10*3/uL (ref 0.0–0.1)
BASOS PCT: 0 %
EOS PCT: 2 %
Eosinophils Absolute: 0.2 10*3/uL (ref 0.0–0.7)
HEMATOCRIT: 38.7 % (ref 36.0–46.0)
Hemoglobin: 12.1 g/dL (ref 12.0–15.0)
Lymphocytes Relative: 33 %
Lymphs Abs: 2.6 10*3/uL (ref 0.7–4.0)
MCH: 23.4 pg — ABNORMAL LOW (ref 26.0–34.0)
MCHC: 31.3 g/dL (ref 30.0–36.0)
MCV: 74.7 fL — ABNORMAL LOW (ref 78.0–100.0)
MONO ABS: 0.6 10*3/uL (ref 0.1–1.0)
Monocytes Relative: 7 %
Neutro Abs: 4.5 10*3/uL (ref 1.7–7.7)
Neutrophils Relative %: 58 %
PLATELETS: 359 10*3/uL (ref 150–400)
RBC: 5.18 MIL/uL — ABNORMAL HIGH (ref 3.87–5.11)
RDW: 16.2 % — AB (ref 11.5–15.5)
WBC: 7.9 10*3/uL (ref 4.0–10.5)

## 2016-03-09 MED ORDER — ONDANSETRON 4 MG PO TBDP: ORAL_TABLET | ORAL | 0 refills | Status: DC

## 2016-03-09 MED ORDER — ONDANSETRON HCL 4 MG/2ML IJ SOLN
4.0000 mg | Freq: Once | INTRAMUSCULAR | Status: AC
  Administered 2016-03-09: 4 mg via INTRAVENOUS
  Filled 2016-03-09: qty 2

## 2016-03-09 MED ORDER — BENZONATATE 100 MG PO CAPS: 100.0000 mg | ORAL_CAPSULE | Freq: Three times a day (TID) | ORAL | 0 refills | Status: DC

## 2016-03-09 MED ORDER — BENZONATATE 100 MG PO CAPS
200.0000 mg | ORAL_CAPSULE | Freq: Once | ORAL | Status: AC
  Administered 2016-03-10: 200 mg via ORAL
  Filled 2016-03-09: qty 2

## 2016-03-09 MED ORDER — MORPHINE SULFATE (PF) 4 MG/ML IV SOLN
4.0000 mg | Freq: Once | INTRAVENOUS | Status: AC
  Administered 2016-03-09: 4 mg via INTRAVENOUS
  Filled 2016-03-09: qty 1

## 2016-03-09 NOTE — ED Triage Notes (Signed)
Pt is undergoing chemotherapy for bladder cancer and here today for body aches and upper respiratory symptoms.

## 2016-03-09 NOTE — ED Provider Notes (Signed)
[facility] DEPT Provider Note   CSN: [id number] Arrival date & time: 03/09/16  2122     History   Chief Complaint Chief Complaint  Patient presents with  . Cough    HPI Sandra [name] is a 41 y.o. female.  Persistent cough onset several days ago along with myalgias and one day of fever.  Patient is on chemotherapy for bladder cancer. Last treatment one week ago. Today while in ED patient has developed nausea with vomiting. She states she typically develops vomiting after chemotherapy.    The history is provided by the patient. No language interpreter was used.  URI   This is a new problem. The current episode started more than 2 days ago. The fever has been present for less than 1 day. Associated symptoms include nausea, vomiting, congestion and cough. She has tried rest and increased fluids for the symptoms. The treatment provided no relief.    Past Medical History:  Diagnosis Date  . ADHD (attention deficit hyperactivity disorder)   . Allergic rhinitis   . Anemia    hx of  . Bipolar 1 disorder ([place])   . Bladder tumor   . Cancer ([place])   . Dizziness   . Full dentures   . GERD (gastroesophageal reflux disease)   . Gross hematuria   . Hyperlipidemia   . Type 2 diabetes mellitus ([place])   . Urgency of urination    dysuria, sui  . Wears glasses     Patient Active Problem List   Diagnosis Date Noted  . Postoperative anemia due to acute blood loss 11/04/2015  . Bladder cancer ([place]) 10/31/2015  . CARPAL TUNNEL SYNDROME 10/02/2009    Past Surgical History:  Procedure Laterality Date  . CYSTOSCOPY N/A 10/03/2015   Procedure: CYSTOSCOPY;  Surgeon: Irine Seal, MD;  Location: [facility];  Service: Urology;  Laterality: N/A;  . CYSTOSCOPY WITH STENT PLACEMENT Right 10/31/2015   Procedure: CYSTOSCOPY WITH ATTEMPTED RIGHT URETERAL OPENING;  Surgeon: Irine Seal, MD;  Location: [facility];  Service: Urology;  Laterality: Right;  . MULTIPLE  TOOTH EXTRACTIONS  2015  . OVARIAN CYST REMOVAL Right 2004 approx  . TONSILLECTOMY  12/30/2004  . TOTAL ABDOMINAL HYSTERECTOMY W/ BILATERAL SALPINGOOPHORECTOMY  05/16/2004  . TRANSURETHRAL RESECTION OF BLADDER TUMOR N/A 10/03/2015   Procedure: TRANSURETHRAL RESECTION OF BLADDER TUMOR (TURBT);  Surgeon: Irine Seal, MD;  Location: [facility];  Service: Urology;  Laterality: N/A;  . TRANSURETHRAL RESECTION OF BLADDER TUMOR N/A 10/31/2015   Procedure: RE-STAGINGG TRANSURETHRAL RESECTION OF BLADDER TUMOR (TURBT);  Surgeon: Irine Seal, MD;  Location: [facility];  Service: Urology;  Laterality: N/A;    OB History    Gravida Para Term Preterm AB Living   2         2   SAB TAB Ectopic Multiple Live Births                   Home Medications    Prior to Admission medications   Medication Sig Start Date End Date Taking? Authorizing Provider  ALPRAZolam (XANAX) 0.25 MG tablet Take 0.25 mg by mouth 3 (three) times daily. 10/14/15   Historical Provider, MD  benzonatate (TESSALON) 100 MG capsule Take 1 capsule by mouth 3 (three) times daily as needed for cough. 10/16/15   Historical Provider, MD  cetirizine (ZYRTEC) 10 MG tablet Take 1 tablet by mouth every morning.  10/09/14   Historical Provider, MD  diclofenac (VOLTAREN) 75  MG EC tablet Take 1 tablet (75 mg total) by mouth 2 (two) times daily with a meal. 03/04/16   Sanjuana Kava, MD  EQUETRO 300 MG CP12 Take 1 capsule by mouth 2 (two) times daily. 10/24/14   Historical Provider, MD  ferrous sulfate 325 (65 FE) MG tablet Take 1 tablet (325 mg total) by mouth 3 (three) times daily with meals. 11/04/15   Irine Seal, MD  FLUoxetine (PROZAC) 20 MG capsule Take 10 mg by mouth at bedtime.  10/27/14   Historical Provider, MD  fluticasone (FLONASE) 50 MCG/ACT nasal spray Place 2 sprays into both nostrils daily. 03/28/13   Elisha Headland, FNP  HYDROcodone-acetaminophen (NORCO) 5-325 MG tablet Take 1 tablet by mouth every 6 (six)  hours as needed for moderate pain. Patient not taking: Reported on 02/04/2016 10/31/15   Irine Seal, MD  HYDROmorphone (DILAUDID) 4 MG tablet Take 1 tablet (4 mg total) by mouth every 6 (six) hours as needed for severe pain. 02/04/16   Fredia Sorrow, MD  LATUDA 40 MG TABS tablet Take 40 mg by mouth at bedtime. 10/16/15   Historical Provider, MD  lovastatin (MEVACOR) 40 MG tablet Take 40 mg by mouth at bedtime.    Historical Provider, MD  metFORMIN (GLUCOPHAGE) 500 MG tablet Take 500 mg by mouth 2 (two) times daily with a meal.     Historical Provider, MD  montelukast (SINGULAIR) 10 MG tablet Take 10 mg by mouth at bedtime. 10/23/15   Historical Provider, MD  ondansetron (ZOFRAN ODT) 8 MG disintegrating tablet 8mg  ODT q4 hours prn nausea 02/29/16   Ripley Fraise, MD  pantoprazole (PROTONIX) 40 MG tablet Take 1 tablet by mouth every evening.  10/09/14   Historical Provider, MD  PROAIR HFA 108 (90 Base) MCG/ACT inhaler Inhale 1-2 puffs into the lungs every 4 (four) hours as needed for shortness of breath. 10/20/15   Historical Provider, MD  VYVANSE 70 MG capsule Take 70 mg by mouth daily. 10/14/15   Historical Provider, MD    Family History Family History  Problem Relation Age of Onset  . Hypertension Mother   . Cancer Mother   . Breast cancer Maternal Aunt   . Lung cancer Maternal Uncle   . Cancer Other     Social History Social History  Substance Use Topics  . Smoking status: Current Every Day Smoker    Packs/day: 0.50    Types: Cigarettes  . Smokeless tobacco: Former Systems developer    Quit date: 09/29/2015     Comment: down to 1/2pp2d from 2 ppd on 09-24-2015  . Alcohol use No     Allergies   Benazepril; Metronidazole; Adhesive [tape]; Codeine; Loratadine; and Penicillins   Review of Systems Review of Systems  Constitutional: Positive for fever.  HENT: Positive for congestion.   Respiratory: Positive for cough.   Gastrointestinal: Positive for nausea and vomiting.  Musculoskeletal:  Positive for myalgias.  All other systems reviewed and are negative.    Physical Exam Updated Vital Signs BP 133/84 (BP Location: Right Arm)   Pulse 85   Temp 98.7 F (37.1 C) (Oral)   Resp 20   Ht 5\' 1"  (1.549 m)   Wt 85.3 kg   SpO2 100%   BMI 35.52 kg/m   Physical Exam  Constitutional: She is oriented to person, place, and time. She appears well-developed and well-nourished.  HENT:  Head: Normocephalic.  Eyes: Conjunctivae are normal.  Neck: Neck supple.  Cardiovascular: Normal rate and regular rhythm.   Pulmonary/Chest: Effort  normal and breath sounds normal.  Abdominal: Soft. Bowel sounds are normal.  Musculoskeletal: Normal range of motion.  Neurological: She is alert and oriented to person, place, and time.  Skin: Skin is warm and dry.  Psychiatric: She has a normal mood and affect.  Nursing note and vitals reviewed.    ED Treatments / Results  Labs (all labs ordered are listed, but only abnormal results are displayed) Labs Reviewed  CBC WITH DIFFERENTIAL/PLATELET - Abnormal; Notable for the following:       Result Value   RBC 5.18 (*)    MCV 74.7 (*)    MCH 23.4 (*)    RDW 16.2 (*)    All other components within normal limits  BASIC METABOLIC PANEL - Abnormal; Notable for the following:    Glucose, Bld 167 (*)    All other components within normal limits    EKG  EKG Interpretation None       Radiology Dg Chest 2 View  Result Date: 03/09/2016 CLINICAL DATA:  Fever and cough. History of bladder cancer on chemotherapy. EXAM: CHEST  2 VIEW COMPARISON:  Chest x-ray dated 02/01/2016. FINDINGS: Heart size and mediastinal contours are normal. Lungs are clear. No pleural effusion or pneumothorax seen. No acute or suspicious osseous findings seen. IMPRESSION: No active cardiopulmonary disease. No evidence of pneumonia or pulmonary edema. Electronically Signed   By: Franki Cabot M.D.   On: 03/09/2016 22:13    Procedures Procedures (including critical care  time)  Medications Ordered in ED Medications  ondansetron (ZOFRAN) injection 4 mg (not administered)  morphine 4 MG/ML injection 4 mg (not administered)     Initial Impression / Assessment and Plan / ED Course  I have reviewed the triage vital signs and the nursing notes.  Pertinent labs & imaging results that were available during my care of the patient were reviewed by me and considered in my medical decision making (see chart for details).     Labs reviewed and are reassuring. Pt symptoms consistent with URI. CXR negative for acute infiltrate. Pt will be discharged with symptomatic treatment.  Discussed return precautions.  Pt is hemodynamically stable & in NAD prior to discharge.  Final Clinical Impressions(s) / ED Diagnoses   Final diagnoses:  Cough    New Prescriptions New Prescriptions   BENZONATATE (TESSALON) 100 MG CAPSULE    Take 1 capsule (100 mg total) by mouth every 8 (eight) hours.   ONDANSETRON (ZOFRAN ODT) 4 MG DISINTEGRATING TABLET    4mg  ODT q6 hours prn nausea/vomit     Etta Quill, NP 03/10/16 [id number]    Daleen Bo, MD 03/10/16 [phone]

## 2016-03-10 ENCOUNTER — Ambulatory Visit: Payer: Medicaid Other | Admitting: Orthopaedic Surgery

## 2016-03-10 NOTE — ED Notes (Signed)
Pt states understanding of care given and follow up instructions.  Pt ambulated from ED with steady gait, a/o with 2 sons to transport home

## 2016-03-18 ENCOUNTER — Ambulatory Visit: Payer: Medicaid Other | Admitting: Urology

## 2016-04-01 ENCOUNTER — Ambulatory Visit: Payer: Medicaid Other | Admitting: Orthopaedic Surgery

## 2016-04-01 ENCOUNTER — Ambulatory Visit: Payer: Medicaid Other | Admitting: Urology

## 2016-04-02 ENCOUNTER — Encounter (HOSPITAL_COMMUNITY): Payer: Self-pay | Admitting: Emergency Medicine

## 2016-04-02 DIAGNOSIS — F909 Attention-deficit hyperactivity disorder, unspecified type: Secondary | ICD-10-CM | POA: Diagnosis not present

## 2016-04-02 DIAGNOSIS — E119 Type 2 diabetes mellitus without complications: Secondary | ICD-10-CM | POA: Insufficient documentation

## 2016-04-02 DIAGNOSIS — Z8551 Personal history of malignant neoplasm of bladder: Secondary | ICD-10-CM | POA: Insufficient documentation

## 2016-04-02 DIAGNOSIS — R1031 Right lower quadrant pain: Secondary | ICD-10-CM | POA: Insufficient documentation

## 2016-04-02 DIAGNOSIS — F17210 Nicotine dependence, cigarettes, uncomplicated: Secondary | ICD-10-CM | POA: Diagnosis not present

## 2016-04-02 DIAGNOSIS — R112 Nausea with vomiting, unspecified: Secondary | ICD-10-CM | POA: Insufficient documentation

## 2016-04-02 DIAGNOSIS — Z7984 Long term (current) use of oral hypoglycemic drugs: Secondary | ICD-10-CM | POA: Insufficient documentation

## 2016-04-02 DIAGNOSIS — Z79899 Other long term (current) drug therapy: Secondary | ICD-10-CM | POA: Insufficient documentation

## 2016-04-02 NOTE — ED Triage Notes (Signed)
Pt states that she started having RLQ pain today with nausea and vomiting, finished chemo for bladder cancer 1.5 weeks ago

## 2016-04-03 ENCOUNTER — Emergency Department (HOSPITAL_COMMUNITY): Payer: Medicaid Other

## 2016-04-03 ENCOUNTER — Emergency Department (HOSPITAL_COMMUNITY)
Admission: EM | Admit: 2016-04-03 | Discharge: 2016-04-03 | Disposition: A | Discharge status: Home / Self Care | Payer: Medicaid Other | Attending: Emergency Medicine | Admitting: Emergency Medicine

## 2016-04-03 DIAGNOSIS — R1031 Right lower quadrant pain: Secondary | ICD-10-CM

## 2016-04-03 LAB — COMPREHENSIVE METABOLIC PANEL
ALK PHOS: 123 U/L (ref 38–126)
ALT: 22 U/L (ref 14–54)
ANION GAP: 10 (ref 5–15)
AST: 24 U/L (ref 15–41)
Albumin: 3.9 g/dL (ref 3.5–5.0)
BILIRUBIN TOTAL: 0.3 mg/dL (ref 0.3–1.2)
BUN: 19 mg/dL (ref 6–20)
CALCIUM: 9.6 mg/dL (ref 8.9–10.3)
CO2: 25 mmol/L (ref 22–32)
Chloride: 99 mmol/L — ABNORMAL LOW (ref 101–111)
Creatinine, Ser: 0.84 mg/dL (ref 0.44–1.00)
GFR calc non Af Amer: >60 mL/min (ref >60)
Glucose, Bld: 132 mg/dL — ABNORMAL HIGH (ref 65–99)
POTASSIUM: 4 mmol/L (ref 3.5–5.1)
SODIUM: 134 mmol/L — AB (ref 135–145)
TOTAL PROTEIN: 7.7 g/dL (ref 6.5–8.1)

## 2016-04-03 LAB — CBC
HEMATOCRIT: 40.1 % (ref 36.0–46.0)
Hemoglobin: 12.8 g/dL (ref 12.0–15.0)
MCH: 24.2 pg — ABNORMAL LOW (ref 26.0–34.0)
MCHC: 31.9 g/dL (ref 30.0–36.0)
MCV: 75.7 fL — ABNORMAL LOW (ref 78.0–100.0)
Platelets: 374 10*3/uL (ref 150–400)
RBC: 5.3 MIL/uL — AB (ref 3.87–5.11)
RDW: 18 % — ABNORMAL HIGH (ref 11.5–15.5)
WBC: 15.6 10*3/uL — ABNORMAL HIGH (ref 4.0–10.5)

## 2016-04-03 LAB — URINALYSIS, ROUTINE W REFLEX MICROSCOPIC
Bilirubin Urine: NEGATIVE
Glucose, UA: NEGATIVE mg/dL
Hgb urine dipstick: NEGATIVE
Ketones, ur: NEGATIVE mg/dL
Leukocytes, UA: NEGATIVE
NITRITE: NEGATIVE
Protein, ur: NEGATIVE mg/dL
SPECIFIC GRAVITY, URINE: 1.018 (ref 1.005–1.030)
pH: 5 (ref 5.0–8.0)

## 2016-04-03 LAB — LIPASE, BLOOD: Lipase: 19 U/L (ref 11–51)

## 2016-04-03 MED ORDER — ONDANSETRON HCL 4 MG/2ML IJ SOLN
4.0000 mg | Freq: Once | INTRAMUSCULAR | Status: AC
  Administered 2016-04-03: 4 mg via INTRAVENOUS
  Filled 2016-04-03: qty 2

## 2016-04-03 MED ORDER — IOPAMIDOL (ISOVUE-M 300) INJECTION 61%: 15.0000 mL | Freq: Once | INTRAMUSCULAR | Status: DC | PRN

## 2016-04-03 MED ORDER — HYDROMORPHONE HCL 1 MG/ML IJ SOLN
1.0000 mg | Freq: Once | INTRAMUSCULAR | Status: AC
  Administered 2016-04-03: 1 mg via INTRAVENOUS
  Filled 2016-04-03: qty 1

## 2016-04-03 MED ORDER — SODIUM CHLORIDE 0.9 % IV BOLUS (SEPSIS)
1000.0000 mL | Freq: Once | INTRAVENOUS | Status: AC
  Administered 2016-04-03: 1000 mL via INTRAVENOUS

## 2016-04-03 MED ORDER — IOPAMIDOL (ISOVUE-300) INJECTION 61%
100.0000 mL | Freq: Once | INTRAVENOUS | Status: AC | PRN
  Administered 2016-04-03: 100 mL via INTRAVENOUS

## 2016-04-03 NOTE — ED Provider Notes (Signed)
[facility] DEPT Provider Note   CSN: [id number] Arrival date & time: 04/02/16  2136     History   Chief Complaint Chief Complaint  Patient presents with  . Abdominal Pain    HPI ARLEIGH DICOLA is a 41 y.o. female.  The history is provided by the patient.  Abdominal Pain   This is a new problem. The current episode started 3 to 5 hours ago. The problem occurs constantly. The problem has been gradually worsening. The pain is located in the RLQ. The pain is severe. Associated symptoms include nausea and vomiting. Pertinent negatives include fever, diarrhea and dysuria. The symptoms are aggravated by certain positions and palpation. Nothing relieves the symptoms.  pt presents with onset of RLQ pain several hrs ago She has never had this pain before   It has been >1.5 weeks since last chemo treatment for bladder CA This current pain is unrelated to previous issues with chemo   Past Medical History:  Diagnosis Date  . ADHD (attention deficit hyperactivity disorder)   . Allergic rhinitis   . Anemia    hx of  . Bipolar 1 disorder ([place])   . Bladder tumor   . Cancer ([place])   . Dizziness   . Full dentures   . GERD (gastroesophageal reflux disease)   . Gross hematuria   . Hyperlipidemia   . Type 2 diabetes mellitus ([place])   . Urgency of urination    dysuria, sui  . Wears glasses     Patient Active Problem List   Diagnosis Date Noted  . Postoperative anemia due to acute blood loss 11/04/2015  . Bladder cancer ([place]) 10/31/2015  . CARPAL TUNNEL SYNDROME 10/02/2009    Past Surgical History:  Procedure Laterality Date  . CYSTOSCOPY N/A 10/03/2015   Procedure: CYSTOSCOPY;  Surgeon: Irine Seal, MD;  Location: [facility];  Service: Urology;  Laterality: N/A;  . CYSTOSCOPY WITH STENT PLACEMENT Right 10/31/2015   Procedure: CYSTOSCOPY WITH ATTEMPTED RIGHT URETERAL OPENING;  Surgeon: Irine Seal, MD;  Location: [facility];  Service: Urology;   Laterality: Right;  . MULTIPLE TOOTH EXTRACTIONS  2015  . OVARIAN CYST REMOVAL Right 2004 approx  . TONSILLECTOMY  12/30/2004  . TOTAL ABDOMINAL HYSTERECTOMY W/ BILATERAL SALPINGOOPHORECTOMY  05/16/2004  . TRANSURETHRAL RESECTION OF BLADDER TUMOR N/A 10/03/2015   Procedure: TRANSURETHRAL RESECTION OF BLADDER TUMOR (TURBT);  Surgeon: Irine Seal, MD;  Location: [facility];  Service: Urology;  Laterality: N/A;  . TRANSURETHRAL RESECTION OF BLADDER TUMOR N/A 10/31/2015   Procedure: RE-STAGINGG TRANSURETHRAL RESECTION OF BLADDER TUMOR (TURBT);  Surgeon: Irine Seal, MD;  Location: [facility];  Service: Urology;  Laterality: N/A;    OB History    Gravida Para Term Preterm AB Living   2         2   SAB TAB Ectopic Multiple Live Births                   Home Medications    Prior to Admission medications   Medication Sig Start Date End Date Taking? Authorizing Provider  ALPRAZolam (XANAX) 0.25 MG tablet Take 0.25 mg by mouth 3 (three) times daily. 10/14/15   Historical Provider, MD  benzonatate (TESSALON) 100 MG capsule Take 1 capsule (100 mg total) by mouth every 8 (eight) hours. 03/09/16   Etta Quill, NP  cetirizine (ZYRTEC) 10 MG tablet Take 1 tablet by mouth every morning.  10/09/14   Historical Provider, MD  diclofenac (VOLTAREN) 75 MG EC tablet Take 1 tablet (75 mg total) by mouth 2 (two) times daily with a meal. 03/04/16   Sanjuana Kava, MD  EQUETRO 300 MG CP12 Take 1 capsule by mouth 2 (two) times daily. 10/24/14   Historical Provider, MD  ferrous sulfate 325 (65 FE) MG tablet Take 1 tablet (325 mg total) by mouth 3 (three) times daily with meals. 11/04/15   Irine Seal, MD  FLUoxetine (PROZAC) 20 MG capsule Take 10 mg by mouth at bedtime.  10/27/14   Historical Provider, MD  fluticasone (FLONASE) 50 MCG/ACT nasal spray Place 2 sprays into both nostrils daily. 03/28/13   Elisha Headland, FNP  HYDROcodone-acetaminophen (NORCO) 5-325 MG tablet Take 1 tablet by mouth  every 6 (six) hours as needed for moderate pain. Patient not taking: Reported on 02/04/2016 10/31/15   Irine Seal, MD  HYDROmorphone (DILAUDID) 4 MG tablet Take 1 tablet (4 mg total) by mouth every 6 (six) hours as needed for severe pain. 02/04/16   Fredia Sorrow, MD  LATUDA 40 MG TABS tablet Take 40 mg by mouth at bedtime. 10/16/15   Historical Provider, MD  lovastatin (MEVACOR) 40 MG tablet Take 40 mg by mouth at bedtime.    Historical Provider, MD  metFORMIN (GLUCOPHAGE) 500 MG tablet Take 500 mg by mouth 2 (two) times daily with a meal.     Historical Provider, MD  montelukast (SINGULAIR) 10 MG tablet Take 10 mg by mouth at bedtime. 10/23/15   Historical Provider, MD  ondansetron (ZOFRAN ODT) 4 MG disintegrating tablet 4mg  ODT q6 hours prn nausea/vomit 03/09/16   Etta Quill, NP  pantoprazole (PROTONIX) 40 MG tablet Take 1 tablet by mouth every evening.  10/09/14   Historical Provider, MD  PROAIR HFA 108 (90 Base) MCG/ACT inhaler Inhale 1-2 puffs into the lungs every 4 (four) hours as needed for shortness of breath. 10/20/15   Historical Provider, MD  VYVANSE 70 MG capsule Take 70 mg by mouth daily. 10/14/15   Historical Provider, MD    Family History Family History  Problem Relation Age of Onset  . Hypertension Mother   . Cancer Mother   . Breast cancer Maternal Aunt   . Lung cancer Maternal Uncle   . Cancer Other     Social History Social History  Substance Use Topics  . Smoking status: Current Every Day Smoker    Packs/day: 0.50    Types: Cigarettes  . Smokeless tobacco: Former Systems developer    Quit date: 09/29/2015     Comment: down to 1/2pp2d from 2 ppd on 09-24-2015  . Alcohol use No     Allergies   Benazepril; Fentanyl; Metronidazole; Adhesive [tape]; Codeine; Loratadine; and Penicillins   Review of Systems Review of Systems  Constitutional: Negative for fever.  Gastrointestinal: Positive for abdominal pain, nausea and vomiting. Negative for diarrhea.  Genitourinary: Negative  for dysuria.  All other systems reviewed and are negative.    Physical Exam Updated Vital Signs BP (!) 150/89 (BP Location: Right Arm)   Pulse 93   Temp 98.1 F (36.7 C) (Oral)   Resp 18   Ht 4\' 11"  (1.499 m)   Wt 89.4 kg   SpO2 98%   BMI 39.79 kg/m   Physical Exam CONSTITUTIONAL: Chronically ill appearing, appears older than stated age HEAD: Normocephalic/atraumatic EYES: EOMI/PERRL ENMT: Mucous membranes moist NECK: supple no meningeal signs SPINE/BACK:entire spine nontender CV: S1/S2 noted, no murmurs/rubs/gallops noted LUNGS: Lungs are clear to auscultation bilaterally, no apparent distress ABDOMEN:  soft, moderate RLQ tenderness, no rebound or guarding, bowel sounds noted throughout abdomen GU:no cva tenderness NEURO: Pt is awake/alert/appropriate, moves all extremitiesx4.  No facial droop.   EXTREMITIES: pulses normal/equal, full ROM SKIN: warm, color normal PSYCH: no abnormalities of mood noted, alert and oriented to situation   ED Treatments / Results  Labs (all labs ordered are listed, but only abnormal results are displayed) Labs Reviewed  COMPREHENSIVE METABOLIC PANEL - Abnormal; Notable for the following:       Result Value   Sodium 134 (*)    Chloride 99 (*)    Glucose, Bld 132 (*)    All other components within normal limits  CBC - Abnormal; Notable for the following:    WBC 15.6 (*)    RBC 5.30 (*)    MCV 75.7 (*)    MCH 24.2 (*)    RDW 18.0 (*)    All other components within normal limits  LIPASE, BLOOD  URINALYSIS, ROUTINE W REFLEX MICROSCOPIC    EKG  EKG Interpretation None       Radiology Ct Abdomen Pelvis W Contrast  Result Date: 04/03/2016 CLINICAL DATA:  Right lower quadrant pain. Nausea and vomiting. Bladder cancer. EXAM: CT ABDOMEN AND PELVIS WITH CONTRAST TECHNIQUE: Multidetector CT imaging of the abdomen and pelvis was performed using the standard protocol following bolus administration of intravenous contrast. CONTRAST:   138mL ISOVUE-300 IOPAMIDOL (ISOVUE-300) INJECTION 61% COMPARISON:  02/01/2016 CT abdomen pelvis FINDINGS: Lower chest: No pulmonary nodules. No visible pleural or pericardial effusion. Hepatobiliary: Normal hepatic size and contours without focal liver lesion. No perihepatic ascites. No intra- or extrahepatic biliary dilatation. Normal gallbladder. Pancreas: Normal pancreatic contours and enhancement. No peripancreatic fluid collection or pancreatic ductal dilatation. Spleen: Normal. Adrenals/Urinary Tract: Normal adrenal glands. There is mild right hydroureteronephrosis without a visible ureteral obstruction. Excretory phase images show minimal opacification of the right collecting system and no opacification of the right ureter, despite normal excretion on the left. The left kidney is normal. Stomach/Bowel: No abnormal bowel dilatation. No bowel wall thickening or adjacent fat stranding to indicate acute inflammation. No abdominal fluid collection. The appendix is not visualized. No inflammatory stranding or free fluid in the right lower quadrant. Vascular/Lymphatic: Atherosclerotic calcification within the proximal common iliac arteries. No abdominal or pelvic adenopathy. Reproductive: Status post hysterectomy.  No adnexal mass. Musculoskeletal: No lytic or blastic osseous lesion. Normal visualized extrathoracic and extraperitoneal soft tissues. Other: No contributory non-categorized findings. IMPRESSION: 1. No acute abnormality of the abdomen or pelvis. 2. Mild right hydroureteronephrosis with delayed/impaired contrast excretion relative to the left side. There is no obstructing stone visualized ; however, in the context of patient's prior bladder cancer, perhaps this is secondary to granulation tissue at the prior treatment site or some other bladder abnormality near the ureteral orifice. Correlation with cystoscopy might be helpful. Electronically Signed   By: Ulyses Jarred M.D.   On: 04/03/2016 03:44     Procedures Procedures (including critical care time)  Medications Ordered in ED Medications  sodium chloride 0.9 % bolus 1,000 mL (1,000 mLs Intravenous New Bag/Given 04/03/16 0202)  ondansetron (ZOFRAN) injection 4 mg (4 mg Intravenous Given 04/03/16 0204)  HYDROmorphone (DILAUDID) injection 1 mg (1 mg Intravenous Given 04/03/16 0204)     Initial Impression / Assessment and Plan / ED Course  I have reviewed the triage vital signs and the nursing notes.  Pertinent labs  results that were available during my care of the patient were reviewed by me and  considered in my medical decision making (see chart for details).     2:33 AM Pt with RLQ tenderness, elevated WBC, will obtain CT imaging as these are new symptoms   After monitoring in the ED, pt improved She reports continued nausea but abd pain is improved Although on CT, appendix not completely identified by no secondary signs of appendicitis  ?right hydronephrosis, advised close f/u with her urology specialist as she may need cystoscopy but overall well appearing I feel she is safe for discharge home BP (!) 128/96 (BP Location: Left Arm)   Pulse 76   Temp 98.1 F (36.7 C) (Oral)   Resp 16   Ht 4\' 11"  (1.499 m)   Wt 89.4 kg   SpO2 97%   BMI 39.79 kg/m    Final Clinical Impressions(s) / ED Diagnoses   Final diagnoses:  Right lower quadrant abdominal pain    New Prescriptions New Prescriptions   No medications on file     Ripley Fraise, MD 04/03/16 [phone]

## 2016-04-03 NOTE — Discharge Instructions (Signed)
°  SEEK IMMEDIATE MEDICAL ATTENTION IF: The pain does not go away or becomes severe, particularly over the next 8-12 hours.  A temperature above 100.29F develops.  The pain becomes localized to portions of the abdomen. The right side could possibly be appendicitis. In an adult, the left lower portion of the abdomen could be colitis or diverticulitis.  Blood is being passed in stools or vomit (bright red or black tarry stools).  Return also if you develop chest pain, difficulty breathing, dizziness or fainting, or become confused, poorly responsive, or inconsolable.

## 2016-04-03 NOTE — ED Notes (Signed)
Pt states understanding of care given and follow up instructions.  Pt a/o however was not able to ambulate with steady gait due to pain medication.  Was escorted to vehicle in wheelchair.  Female family member in drivers seat to transport pt home and assist with care

## 2016-04-08 ENCOUNTER — Ambulatory Visit (INDEPENDENT_AMBULATORY_CARE_PROVIDER_SITE_OTHER): Payer: Medicaid Other | Admitting: Orthopaedic Surgery

## 2016-04-08 ENCOUNTER — Encounter: Payer: Self-pay | Admitting: Orthopaedic Surgery

## 2016-04-08 VITALS — BP 134/91 | HR 90 | Ht 61.0 in | Wt 190.0 lb

## 2016-04-08 DIAGNOSIS — F17210 Nicotine dependence, cigarettes, uncomplicated: Secondary | ICD-10-CM | POA: Diagnosis not present

## 2016-04-08 DIAGNOSIS — M79672 Pain in left foot: Secondary | ICD-10-CM

## 2016-04-08 NOTE — Progress Notes (Signed)
Patient VP:XTGGY Sandra [name], female DOB:Jun 20, 1975, 41 y.o. IRS:[id number]  Chief Complaint  Patient presents with  . Follow-up    left foot pain    HPI  Sandra [name] is a 41 y.o. female who has a painful left foot on the plantar area at the fifth metatarsal base.  She had a hard callus pared by me last time but she has pain still present.  She also has pain at the base of the left great toe medially.  She has inserts for the shoe but it has not helped.  I will have [place] see her.  She is agreeable to this. HPI  Body mass index is 35.9 kg/m.  ROS  Review of Systems  HENT: Negative for congestion.   Respiratory: Negative for cough and shortness of breath.   Cardiovascular: Negative for chest pain and leg swelling.  Endocrine: Positive for cold intolerance.  Musculoskeletal: Positive for arthralgias.  Allergic/Immunologic: Positive for environmental allergies.    Past Medical History:  Diagnosis Date  . ADHD (attention deficit hyperactivity disorder)   . Allergic rhinitis   . Anemia    hx of  . Bipolar 1 disorder ([place])   . Bladder tumor   . Cancer ([place])   . Dizziness   . Full dentures   . GERD (gastroesophageal reflux disease)   . Gross hematuria   . Hyperlipidemia   . Type 2 diabetes mellitus ([place])   . Urgency of urination    dysuria, sui  . Wears glasses     Past Surgical History:  Procedure Laterality Date  . CYSTOSCOPY N/A 10/03/2015   Procedure: CYSTOSCOPY;  Surgeon: Irine Seal, MD;  Location: [facility];  Service: Urology;  Laterality: N/A;  . CYSTOSCOPY WITH STENT PLACEMENT Right 10/31/2015   Procedure: CYSTOSCOPY WITH ATTEMPTED RIGHT URETERAL OPENING;  Surgeon: Irine Seal, MD;  Location: [facility];  Service: Urology;  Laterality: Right;  . MULTIPLE TOOTH EXTRACTIONS  2015  . OVARIAN CYST REMOVAL Right 2004 approx  . TONSILLECTOMY  12/30/2004  . TOTAL ABDOMINAL HYSTERECTOMY W/ BILATERAL SALPINGOOPHORECTOMY   05/16/2004  . TRANSURETHRAL RESECTION OF BLADDER TUMOR N/A 10/03/2015   Procedure: TRANSURETHRAL RESECTION OF BLADDER TUMOR (TURBT);  Surgeon: Irine Seal, MD;  Location: [facility];  Service: Urology;  Laterality: N/A;  . TRANSURETHRAL RESECTION OF BLADDER TUMOR N/A 10/31/2015   Procedure: RE-STAGINGG TRANSURETHRAL RESECTION OF BLADDER TUMOR (TURBT);  Surgeon: Irine Seal, MD;  Location: [facility];  Service: Urology;  Laterality: N/A;    Family History  Problem Relation Age of Onset  . Hypertension Mother   . Cancer Mother   . Breast cancer Maternal Aunt   . Lung cancer Maternal Uncle   . Cancer Other     Social History Social History  Substance Use Topics  . Smoking status: Current Every Day Smoker    Packs/day: 0.50    Types: Cigarettes  . Smokeless tobacco: Former Systems developer    Quit date: 09/29/2015     Comment: down to 1/2pp2d from 2 ppd on 09-24-2015  . Alcohol use No      Current Outpatient Prescriptions  Medication Sig Dispense Refill  . ALPRAZolam (XANAX) 0.25 MG tablet Take 0.25 mg by mouth 3 (three) times daily.  1  . benzonatate (TESSALON) 100 MG capsule Take 1 capsule (100 mg total) by mouth every 8 (eight) hours. 21 capsule 0  . cetirizine (ZYRTEC) 10 MG tablet Take 1 tablet by mouth every morning.  2  . diclofenac (VOLTAREN) 75 MG EC tablet Take 1 tablet (75 mg total) by mouth 2 (two) times daily with a meal. 60 tablet 2  . EQUETRO 300 MG CP12 Take 1 capsule by mouth 2 (two) times daily.  0  . ferrous sulfate 325 (65 FE) MG tablet Take 1 tablet (325 mg total) by mouth 3 (three) times daily with meals. 90 tablet 1  . FLUoxetine (PROZAC) 20 MG capsule Take 10 mg by mouth at bedtime.   0  . fluticasone (FLONASE) 50 MCG/ACT nasal spray Place 2 sprays into both nostrils daily. 9.9 g 2  . HYDROcodone-acetaminophen (NORCO) 5-325 MG tablet Take 1 tablet by mouth every 6 (six) hours as needed for moderate pain. (Patient not taking: Reported on  02/04/2016) 20 tablet 0  . HYDROmorphone (DILAUDID) 4 MG tablet Take 1 tablet (4 mg total) by mouth every 6 (six) hours as needed for severe pain. 20 tablet 0  . LATUDA 40 MG TABS tablet Take 40 mg by mouth at bedtime.  1  . lovastatin (MEVACOR) 40 MG tablet Take 40 mg by mouth at bedtime.    . metFORMIN (GLUCOPHAGE) 500 MG tablet Take 500 mg by mouth 2 (two) times daily with a meal.     . montelukast (SINGULAIR) 10 MG tablet Take 10 mg by mouth at bedtime.  3  . ondansetron (ZOFRAN ODT) 4 MG disintegrating tablet 4mg  ODT q6 hours prn nausea/vomit 8 tablet 0  . pantoprazole (PROTONIX) 40 MG tablet Take 1 tablet by mouth every evening.   2  . PROAIR HFA 108 (90 Base) MCG/ACT inhaler Inhale 1-2 puffs into the lungs every 4 (four) hours as needed for shortness of breath.  0  . VYVANSE 70 MG capsule Take 70 mg by mouth daily.  0   No current facility-administered medications for this visit.      Physical Exam  Blood pressure (!) 134/91, pulse 90, height 5\' 1"  (1.549 m), weight 190 lb (86.2 kg).  Constitutional: overall normal hygiene, normal nutrition, well developed, normal grooming, normal body habitus. Assistive device:none  Musculoskeletal: gait and station Limp left, muscle tone and strength are normal, no tremors or atrophy is present.  .  Neurological: coordination overall normal.  Deep tendon reflex/nerve stretch intact.  Sensation normal.  Cranial nerves II-XII intact.   Skin:   Normal overall no scars, lesions, ulcers or rashes. No psoriasis.  Psychiatric: Alert and oriented x 3.  Recent memory intact, remote memory unclear.  Normal mood and affect. Well groomed.  Good eye contact.  Cardiovascular: overall no swelling, no varicosities, no edema bilaterally, normal temperatures of the legs and arms, no clubbing, cyanosis and good capillary refill.  Lymphatic: palpation is normal.  The plantar side of the left foot has a prominence at the base of the fifth metatarsal area.  I  pared the area again.  She has a very small cyst on the medial side of the left great toe size of a very small grape seed.  NV is intact.  The patient has been educated about the nature of the problem(s) and counseled on treatment options.  The patient appeared to understand what I have discussed and is in agreement with it.  Encounter Diagnoses  Name Primary?  . Left foot pain Yes  . Cigarette nicotine dependence without complication    She continues to smoke and is willing to cut back.  PLAN Call if any problems.  Precautions discussed.  Continue current medications.  Return to clinic [facility] [place].   Electronically Signed Sanjuana Kava, MD 3/28/20182:32 PM

## 2016-04-08 NOTE — Patient Instructions (Signed)
Steps to Quit Smoking Smoking tobacco can be bad for your health. It can also affect almost every organ in your body. Smoking puts you and people around you at risk for many serious long-lasting (chronic) diseases. Quitting smoking is hard, but it is one of the best things that you can do for your health. It is never too late to quit. What are the benefits of quitting smoking? When you quit smoking, you lower your risk for getting serious diseases and conditions. They can include:  Lung cancer or lung disease.  Heart disease.  Stroke.  Heart attack.  Not being able to have children (infertility).  Weak bones (osteoporosis) and broken bones (fractures). If you have coughing, wheezing, and shortness of breath, those symptoms may get better when you quit. You may also get sick less often. If you are pregnant, quitting smoking can help to lower your chances of having a baby of low birth weight. What can I do to help me quit smoking? Talk with your doctor about what can help you quit smoking. Some things you can do (strategies) include:  Quitting smoking totally, instead of slowly cutting back how much you smoke over a period of time.  Going to in-person counseling. You are more likely to quit if you go to many counseling sessions.  Using resources and support systems, such as:  Online chats with a counselor.  Phone quitlines.  Printed self-help materials.  Support groups or group counseling.  Text messaging programs.  Mobile phone apps or applications.  Taking medicines. Some of these medicines may have nicotine in them. If you are pregnant or breastfeeding, do not take any medicines to quit smoking unless your doctor says it is okay. Talk with your doctor about counseling or other things that can help you. Talk with your doctor about using more than one strategy at the same time, such as taking medicines while you are also going to in-person counseling. This can help make quitting  easier. What things can I do to make it easier to quit? Quitting smoking might feel very hard at first, but there is a lot that you can do to make it easier. Take these steps:  Talk to your family and friends. Ask them to support and encourage you.  Call phone quitlines, reach out to support groups, or work with a counselor.  Ask people who smoke to not smoke around you.  Avoid places that make you want (trigger) to smoke, such as:  Bars.  Parties.  Smoke-break areas at work.  Spend time with people who do not smoke.  Lower the stress in your life. Stress can make you want to smoke. Try these things to help your stress:  Getting regular exercise.  Deep-breathing exercises.  Yoga.  Meditating.  Doing a body scan. To do this, close your eyes, focus on one area of your body at a time from head to toe, and notice which parts of your body are tense. Try to relax the muscles in those areas.  Download or buy apps on your mobile phone or tablet that can help you stick to your quit plan. There are many free apps, such as QuitGuide from the CDC (Centers for Disease Control and Prevention). You can find more support from smokefree.gov and other websites. This information is not intended to replace advice given to you by your health care provider. Make sure you discuss any questions you have with your health care provider. Document Released: 10/25/2008 Document Revised: 08/27/2015 Document   Reviewed: 05/15/2014 Elsevier Interactive Patient Education  2017 [employer]  

## 2016-04-27 ENCOUNTER — Encounter: Payer: Self-pay | Admitting: Podiatry

## 2016-04-27 ENCOUNTER — Other Ambulatory Visit: Payer: Self-pay | Admitting: Podiatry

## 2016-04-27 ENCOUNTER — Ambulatory Visit (INDEPENDENT_AMBULATORY_CARE_PROVIDER_SITE_OTHER): Payer: Medicaid Other | Admitting: Podiatry

## 2016-04-27 DIAGNOSIS — Q828 Other specified congenital malformations of skin: Secondary | ICD-10-CM

## 2016-04-27 DIAGNOSIS — L989 Disorder of the skin and subcutaneous tissue, unspecified: Secondary | ICD-10-CM

## 2016-04-27 DIAGNOSIS — R2242 Localized swelling, mass and lump, left lower limb: Secondary | ICD-10-CM | POA: Diagnosis not present

## 2016-04-27 MED ORDER — HYDROCODONE-ACETAMINOPHEN 5-300 MG PO TABS
1.0000 | ORAL_TABLET | Freq: Three times a day (TID) | ORAL | 0 refills | Status: DC | PRN
Start: 2016-04-27 — End: 2016-08-17

## 2016-04-28 LAB — PATHOLOGY

## 2016-04-29 NOTE — Progress Notes (Signed)
   Subjective:  Patient presents the office today as a new patient for evaluation of multiple complaints. Patient complains of a not on the dorsal aspect of her left great toe that is been ongoing for several years. She states that it is painful with shoe gear. Patient also complains of a painful symptomatic skin lesion to the plantar aspect of the left foot. Patient states that in the past is been treated multiple times with salicylic acid and debridement which has not improved symptoms. Patient presents today for further treatment and evaluation    Objective/Physical Exam General: The patient is alert and oriented x3 in no acute distress.  Dermatology: Callus lesion noted to the plantar aspect of the left foot underlying the fifth metatarsal base. Pain on palpation noted as well.  Vascular: Palpable pedal pulses bilaterally. No edema or erythema noted. Capillary refill within normal limits.  Neurological: Epicritic and protective threshold grossly intact bilaterally.   Musculoskeletal Exam: Palpable nodule noted to the dorsal medial aspect of the left great toe approximately 1 cm in diameter. It appears well adhered and somewhat hardened inconsistent with a ganglionic cyst or mucoid cyst.  Radiographic Exam:  Normal osseous mineralization. Joint spaces preserved. No fracture/dislocation/boney destruction.    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 X-rays reviewed #2 excisional debridement of the pre-ulcerative callus lesion was performed to the left foot using a chisel blade and tissue nipper. Dry sterile dressing was applied.. Prior to debridement local anesthesia infiltration was utilized. The skin lesion was removed in toto and placing sterile specimen container and sent to pathology for gross and microscopic exam due to abnormal appearance clinically #3  authorization for surgery was initiated today for an in office procedure. The surgery will consist of excision of the soft tissue tumor/mass of the left great toe #4 prescription for Vicodin provided today #5 return to clinic in 2 weeks   Edrick Kins, DPM [facility]  Dr. Edrick Kins, [address]                                        [place], [place] [address]                Office [phone]  Fax [phone]

## 2016-05-04 ENCOUNTER — Telehealth: Payer: Self-pay | Admitting: *Deleted

## 2016-05-04 ENCOUNTER — Encounter: Payer: Self-pay | Admitting: Podiatry

## 2016-05-04 DIAGNOSIS — R0989 Other specified symptoms and signs involving the circulatory and respiratory systems: Secondary | ICD-10-CM

## 2016-05-04 DIAGNOSIS — R600 Localized edema: Secondary | ICD-10-CM

## 2016-05-04 NOTE — Telephone Encounter (Addendum)
Pt emailed request for results. Dr. Amalia Hailey states the biopsy gross report result was a plantar wart. I informed pt.05/12/2016-Pt's friend, Ulice Bold states pt counted her pain medication and only has 2 more days left. Dr. Amalia Hailey states refill Hydrocodone 5/325mg  #30 1-2 tablets every 6 hours prn foot pain. I told Alanis she could pick up the rx in the [place] office. Ms Selena Batten called and asked if rx could be mailed, since they do not have a ride to pick up the rx. I called pt's and Ms Selena Batten and a child picked up the phone and yelled for "Alanis", and "It's on the dryer." After waiting on the phone on the dryer for 5 minutes, I hung up. Ms Selena Batten called again and states pt has someone to pickup the medication in [place], name of [place].06/15/2016-Pt called for the result of the test just performed. I told pt the final results of the venous doppler was negative for DVT, and the other test were not available and he would discuss at next appt. Pt states she may have to change the appt and I offered to transfer to schedulers and she said she would call again tomorrow.

## 2016-05-04 NOTE — Telephone Encounter (Signed)
Just let patient know the final diagnosis. Also see if the lab can do a microscopic exam. Final report is just a gross exam.   Thanks, Dr. Amalia Hailey

## 2016-05-11 ENCOUNTER — Ambulatory Visit (INDEPENDENT_AMBULATORY_CARE_PROVIDER_SITE_OTHER): Payer: Medicaid Other | Admitting: Podiatry

## 2016-05-11 ENCOUNTER — Other Ambulatory Visit: Payer: Self-pay | Admitting: Podiatry

## 2016-05-11 ENCOUNTER — Encounter: Payer: Self-pay | Admitting: Podiatry

## 2016-05-11 DIAGNOSIS — D492 Neoplasm of unspecified behavior of bone, soft tissue, and skin: Secondary | ICD-10-CM | POA: Diagnosis not present

## 2016-05-11 DIAGNOSIS — R2242 Localized swelling, mass and lump, left lower limb: Secondary | ICD-10-CM

## 2016-05-11 MED ORDER — HYDROCODONE-ACETAMINOPHEN 5-325 MG PO TABS: 1.0000 | ORAL_TABLET | Freq: Four times a day (QID) | ORAL | 0 refills | Status: DC | PRN

## 2016-05-11 NOTE — Progress Notes (Signed)
   OPERATIVE REPORT Patient name: Sandra [name] MRN: [id number] DOB: 07-May-1975  DOS:  05/11/16  Preop Dx: Soft tissue tumor left great toe Postop Dx: same  Procedure:  1. Excision of soft tissue tumor left great toe  Surgeon: Edrick Kins DPM  Anesthesia: 50-50 mixture of 2% lidocaine w/epi with 0.5% Marcaine w/epi totaling 3 mL infiltrated in the patient's left lower extremity  Hemostasis: Ankle tourniquet inflated to a pressure of 249mmHg after esmarch exsanguination   EBL: 1 mL Materials: None Injectables: None Pathology: Soft tissue tumor left great toe  Condition: The patient tolerated the procedure and anesthesia well. No complications noted or reported   Justification for procedure: The patient is a 41 y.o. female who presents today for surgical correction of a symptomatic soft tissue tumor to the left great toe. All conservative modalities of been unsuccessful in providing any sort of satisfactory alleviation of symptoms with the patient. The patient was told benefits as well as possible side effects of the surgery. The patient consented for surgical correction. The patient consent form was reviewed. All patient questions were answered. No guarantees were expressed or implied. The patient and the surgeon boson the patient consent form with the witness present and placed in the patient's chart.   Procedure in Detail: The patient was brought to the procedure room, at which time an aseptic scrub and drape were performed about the patient's respective lower extremity after anesthesia was induced as described above. Attention was then directed to the surgical area where procedure number one commenced.  Procedure #1: Excision of soft tissue tumor left great toe A 1 cm linear longitudinal elliptical type incision was planned and made along the medial aspect of the left great toe. The incision was carried down to level of subcutaneous tissue and soft tissue tumor with care taken  to cut clamped ligated or retracted way all small neurovascular structures traversing the incision site.  The soft tissue tumor was then carefully sharply dissected and removed in toto and placed in a sterile specimen container and sent to pathology. Superficial skin edges were reapproximated using 4-0 nylon suture.  Dry sterile compressive dressings were then applied to all previously mentioned incision sites about the patient's lower extremity. Postoperative shoe was applied. Verbal as well as written instructions were provided for the patient regarding wound care. The patient is to keep the dressings clean dry and intact until they are to follow surgeon Dr. Daylene Katayama in the office upon discharge.   Edrick Kins, DPM [facility]  Dr. Edrick Kins, [address]                                        [place], [place] [address]                Office [phone]  Fax [phone]

## 2016-05-11 NOTE — Addendum Note (Signed)
Addended by: Harriett Sine D on: 05/11/2016 09:42 AM   Modules accepted: Orders

## 2016-05-11 NOTE — Addendum Note (Signed)
Addended by: Harriett Sine D on: 05/11/2016 09:32 AM   Modules accepted: Orders

## 2016-05-11 NOTE — Addendum Note (Signed)
Addended by: Celene Skeen A on: 05/11/2016 09:40 AM   Modules accepted: Orders

## 2016-05-11 NOTE — Addendum Note (Signed)
Addended by: Harriett Sine D on: 05/11/2016 08:57 AM   Modules accepted: Orders

## 2016-05-12 ENCOUNTER — Telehealth: Payer: Self-pay | Admitting: Podiatry

## 2016-05-12 LAB — PATHOLOGY

## 2016-05-12 MED ORDER — HYDROCODONE-ACETAMINOPHEN 5-325 MG PO TABS: 1.0000 | ORAL_TABLET | Freq: Four times a day (QID) | ORAL | 0 refills | Status: DC | PRN

## 2016-05-12 NOTE — Telephone Encounter (Signed)
I spoke with pt, she states the foot throbs constantly. I told pt to be seated, remove the boot, the open-ended sock, ace wrap and leave the gauze in place, then elevate the surgery foot for 15 minutes, but if pain worsens dangle the foot for 15 minutes this being the only time it is okay to dangle the foot, after 15 minutes place to foot level with the hip and rewrap the ace beginning from behind the toes wrapping up the leg looser, reapply the open sock, and boot. I told pt if she was going to be awake and rest she could leave the boot off, but if going to sleep or walk she needed to remain in the boot. I told pt not to be up on the foot or have it below her heart more than 15 minutes/hour and to use crutches to balance and to get surgery foot down and up quickly while walking, rather than holding foot up. I told pt to call again with concerns and take the pain medication as directed by Dr. Amalia Hailey last night.

## 2016-05-12 NOTE — Telephone Encounter (Signed)
Pt. Said shes in a lot of pain, taking 2 pain pills didn't help.

## 2016-05-12 NOTE — Telephone Encounter (Signed)
This patient intercessor  called to the office on the evening of Monday, 05/11/2016 .  The intercessor said that Sandra [name] had surgery performed by Dr. Amalia Hailey earlier in the morning. She says that she had a tumor removed from her foot and was given hydrocodone 05/325 for pain medicine.  The intercessor called to the office to say that the pain medicine was not working and that Sandra [name] was in  severe pain and the pain medicine was not helping.  After determining the procedure. I proceeded to discuss the pain that she was experiencing.  I then recommended that she remove the bandage that was on her foot, which included gauze and Ace wrap to help to relieve pressure at the surgical site. I also wanted her to check to see if there was any blood noted on the bandage itself, thinking there might be a hematoma formation at the surgical site.  Through the intercessor the patient refused to remove any of the bandage snce Dr. Amalia Hailey told her not to remove the bandage.  Also, during my talking with the intercessor I had asked what medication she was taking. She gave the name of a drug that started with the R that I was unfamiliar with.  I then asked her to spell this medication and she realized it was hydrocodone and not the medication she initially told me. Since she would not remove the bandage from the surgical site. I told the patient that it would be best to take 2 tablets every 4 hours for pain and to call the office in the morning.  I told her that if the pain persisted she should go to the emergency room to have her foot evaluated.  Following my conversation with this patient. I felt it best to contact Dr. Amalia Hailey to make him aware of the situation. I proceeded to text him and he texted me   back that he would  call. the patient himself.   Gardiner Barefoot DPM

## 2016-05-12 NOTE — Telephone Encounter (Signed)
She can loosen the bandages if needed. Stress elevation and ice! Also if she wants she can come in for a dressing change and we can prescribe her stronger meds. Could Sandra Billow do the dressing change? Dr. Amalia Hailey

## 2016-05-18 ENCOUNTER — Ambulatory Visit (INDEPENDENT_AMBULATORY_CARE_PROVIDER_SITE_OTHER): Payer: Medicaid Other | Admitting: Podiatry

## 2016-05-18 ENCOUNTER — Other Ambulatory Visit: Payer: Self-pay

## 2016-05-18 ENCOUNTER — Encounter: Payer: Self-pay | Admitting: Podiatry

## 2016-05-18 DIAGNOSIS — D492 Neoplasm of unspecified behavior of bone, soft tissue, and skin: Secondary | ICD-10-CM

## 2016-05-18 DIAGNOSIS — Z9889 Other specified postprocedural states: Secondary | ICD-10-CM

## 2016-05-18 MED ORDER — HYDROCODONE-ACETAMINOPHEN 5-325 MG PO TABS: 1.0000 | ORAL_TABLET | Freq: Four times a day (QID) | ORAL | 0 refills | Status: DC | PRN

## 2016-05-20 ENCOUNTER — Ambulatory Visit (INDEPENDENT_AMBULATORY_CARE_PROVIDER_SITE_OTHER): Payer: Self-pay | Admitting: Podiatry

## 2016-05-20 DIAGNOSIS — D492 Neoplasm of unspecified behavior of bone, soft tissue, and skin: Secondary | ICD-10-CM

## 2016-05-20 DIAGNOSIS — Z9889 Other specified postprocedural states: Secondary | ICD-10-CM

## 2016-05-20 MED ORDER — DOXYCYCLINE HYCLATE 100 MG PO TABS: 100.0000 mg | ORAL_TABLET | Freq: Two times a day (BID) | ORAL | 0 refills | Status: DC

## 2016-05-20 NOTE — Progress Notes (Signed)
   Subjective:  Patient presents today status post excision of soft tissue mass of the left great toe. DOS: 05/11/16 She states she is experiencing pain and swelling of the foot.    Objective/Physical Exam Skin incisions appear to be well coapted with sutures and staples intact. No sign of infectious process noted. No dehiscence. No active bleeding noted. Moderate edema noted to the surgical extremity.   Assessment: 1. s/p excision of soft tissue mass surgery left great toe DOS: 05/11/16   Plan of Care:  1. Patient was evaluated. 2. Return to clinic in one week for suture removal   Edrick Kins, DPM [facility]  Dr. Edrick Kins, [address]                                        [place], [place] [address]                Office [phone]  Fax [phone]

## 2016-05-25 ENCOUNTER — Ambulatory Visit (INDEPENDENT_AMBULATORY_CARE_PROVIDER_SITE_OTHER): Payer: Medicaid Other | Admitting: Podiatry

## 2016-05-25 ENCOUNTER — Encounter: Payer: Self-pay | Admitting: Podiatry

## 2016-05-25 DIAGNOSIS — R600 Localized edema: Secondary | ICD-10-CM | POA: Diagnosis not present

## 2016-05-25 DIAGNOSIS — M79672 Pain in left foot: Secondary | ICD-10-CM

## 2016-05-25 DIAGNOSIS — D492 Neoplasm of unspecified behavior of bone, soft tissue, and skin: Secondary | ICD-10-CM

## 2016-05-25 NOTE — Progress Notes (Signed)
   Subjective:  Patient presents today status post excision of soft tissue mass of the left great toe. DOS: 05/11/16 She states she is experiencing pain and swelling of the foot.  Patient has a new complaint today of left foot swelling. Patient states that she experiences intermittent swelling of her left foot for several years now with no explanation of symptoms by other physicians.   Objective/Physical Exam Skin incisions appear to be well coapted with sutures and staples intact. No sign of infectious process noted. No dehiscence. No active bleeding noted. Moderate edema noted to the surgical extremity. Edema noted diffusely throughout the left foot dorsal aspect.   Assessment: 1. s/p excision of soft tissue mass surgery left great toe DOS: 05/11/16 2. Left foot edema 3. Possible peripheral vascular disease   Plan of Care:  1. Patient was evaluated. 2. Today sutures were removed. Recommend daily application of antibiotic ointment and a Band-Aid 3. Patient can begin showering 4. Today orders were placed for ABI, venous Doppler, and arterial Doppler all of the left lower extremity 5. Return to clinic in 4 weeks  Edrick Kins, DPM [facility]  Dr. Edrick Kins, [address]                                        [place], [place] [address]                Office [phone]  Fax [phone]

## 2016-05-26 NOTE — Telephone Encounter (Addendum)
-----   Message from Edrick Kins, DPM sent at 05/25/2016  6:13 PM EDT ----- Regarding: Vascular studies Please order vascular studies left lower extremity - ABI - Venous Doppler - Arterial Doppler  Dx: Unexplained chronic, intermittent edema left foot. PVD.   Thanks, Dr. Amalia Hailey. Orders faxed to [facility] to pre-cert and to fax to [facility].

## 2016-05-29 NOTE — Progress Notes (Signed)
   Subjective:  Patient presents today status post excision of soft tissue mass of the left great toe. DOS: 05/11/16 She states she is experiencing pain and swelling of the foot.    Objective/Physical Exam Skin incisions appear to be well coapted with sutures and staples intact. No sign of infectious process noted. No dehiscence. No active bleeding noted. Moderate edema noted to the surgical extremity.   Assessment: 1. s/p excision of soft tissue mass surgery left great toe DOS: 05/11/16   Plan of Care:  1. Patient was evaluated. 2. Today sutures were removed. 3. Return to clinic in 4 weeks 4. Begin to transition into normal sneakers    Edrick Kins, DPM [facility]  Dr. Edrick Kins, [address]                                        [place], [place] [address]                Office [phone]  Fax [phone]

## 2016-06-04 ENCOUNTER — Telehealth: Payer: Self-pay

## 2016-06-04 NOTE — Telephone Encounter (Addendum)
Patient has Vascular u[facility] scheduled [facility] for tomorrow 06/05/16 at 7:45am.  Authorization # [id number]  Exp 06/27/16.     Patient has been notified of her appointment tomorrow Vascular tech will contact Dr. Amalia Hailey with results.

## 2016-06-05 ENCOUNTER — Ambulatory Visit (HOSPITAL_COMMUNITY)
Admission: RE | Admit: 2016-06-05 | Discharge: 2016-06-05 | Disposition: A | Discharge status: Home / Self Care | Payer: Medicaid Other | Source: Ambulatory Visit | Attending: Vascular Surgery | Admitting: Vascular Surgery

## 2016-06-05 DIAGNOSIS — R600 Localized edema: Secondary | ICD-10-CM | POA: Diagnosis not present

## 2016-06-12 NOTE — Progress Notes (Signed)
Office Surgery DOS 04.30.2018 Excision of soft tissue tumor left great toe.

## 2016-06-15 ENCOUNTER — Encounter: Payer: Self-pay | Admitting: Podiatry

## 2016-06-16 ENCOUNTER — Encounter (HOSPITAL_COMMUNITY): Payer: Self-pay | Admitting: Emergency Medicine

## 2016-06-16 ENCOUNTER — Emergency Department (HOSPITAL_COMMUNITY)
Admission: EM | Admit: 2016-06-16 | Discharge: 2016-06-17 | Disposition: A | Discharge status: Home / Self Care | Payer: Medicaid Other | Attending: Emergency Medicine | Admitting: Emergency Medicine

## 2016-06-16 DIAGNOSIS — E119 Type 2 diabetes mellitus without complications: Secondary | ICD-10-CM | POA: Diagnosis not present

## 2016-06-16 DIAGNOSIS — F17210 Nicotine dependence, cigarettes, uncomplicated: Secondary | ICD-10-CM | POA: Insufficient documentation

## 2016-06-16 DIAGNOSIS — H9201 Otalgia, right ear: Secondary | ICD-10-CM | POA: Diagnosis not present

## 2016-06-16 DIAGNOSIS — H61891 Other specified disorders of right external ear: Secondary | ICD-10-CM

## 2016-06-16 DIAGNOSIS — Z7984 Long term (current) use of oral hypoglycemic drugs: Secondary | ICD-10-CM | POA: Insufficient documentation

## 2016-06-16 DIAGNOSIS — Z79899 Other long term (current) drug therapy: Secondary | ICD-10-CM | POA: Diagnosis not present

## 2016-06-16 NOTE — ED Triage Notes (Signed)
Pt c/o right ear pain since getting cotton stuck in ear last night.

## 2016-06-17 MED ORDER — CIPROFLOXACIN-HYDROCORTISONE 0.2-1 % OT SUSP: 3.0000 [drp] | Freq: Two times a day (BID) | OTIC | 0 refills | Status: DC

## 2016-06-17 NOTE — ED Provider Notes (Signed)
[facility] DEPT Provider Note   CSN: [id number] Arrival date & time: 06/16/16  2222     History   Chief Complaint Chief Complaint  Patient presents with  . Otalgia    HPI Sandra [name] is a 41 y.o. female who presents for foreign body sensation in her right ear. Patient states he was she was using a Q-tip in it and lost the cotton. She feels there may be cotton in her ear. She saw her PCP states that it was too deep for him to remove. She is sent to the emergency department for further evaluation.  HPI  Past Medical History:  Diagnosis Date  . ADHD (attention deficit hyperactivity disorder)   . Allergic rhinitis   . Anemia    hx of  . Bipolar 1 disorder ([place])   . Bladder tumor   . Cancer ([place])   . Dizziness   . Full dentures   . GERD (gastroesophageal reflux disease)   . Gross hematuria   . Hyperlipidemia   . Type 2 diabetes mellitus ([place])   . Urgency of urination    dysuria, sui  . Wears glasses     Patient Active Problem List   Diagnosis Date Noted  . Postoperative anemia due to acute blood loss 11/04/2015  . Bladder cancer ([place]) 10/31/2015  . CARPAL TUNNEL SYNDROME 10/02/2009    Past Surgical History:  Procedure Laterality Date  . CYSTOSCOPY N/A 10/03/2015   Procedure: CYSTOSCOPY;  Surgeon: Irine Seal, MD;  Location: [facility];  Service: Urology;  Laterality: N/A;  . CYSTOSCOPY WITH STENT PLACEMENT Right 10/31/2015   Procedure: CYSTOSCOPY WITH ATTEMPTED RIGHT URETERAL OPENING;  Surgeon: Irine Seal, MD;  Location: [facility];  Service: Urology;  Laterality: Right;  . MULTIPLE TOOTH EXTRACTIONS  2015  . OVARIAN CYST REMOVAL Right 2004 approx  . TONSILLECTOMY  12/30/2004  . TOTAL ABDOMINAL HYSTERECTOMY W/ BILATERAL SALPINGOOPHORECTOMY  05/16/2004  . TRANSURETHRAL RESECTION OF BLADDER TUMOR N/A 10/03/2015   Procedure: TRANSURETHRAL RESECTION OF BLADDER TUMOR (TURBT);  Surgeon: Irine Seal, MD;  Location[facility];  Service: Urology;  Laterality: N/A;  . TRANSURETHRAL RESECTION OF BLADDER TUMOR N/A 10/31/2015   Procedure: RE-STAGINGG TRANSURETHRAL RESECTION OF BLADDER TUMOR (TURBT);  Surgeon: Irine Seal, MD;  Location: [facility];  Service: Urology;  Laterality: N/A;    OB History    Gravida Para Term Preterm AB Living   2         2   SAB TAB Ectopic Multiple Live Births                   Home Medications    Prior to Admission medications   Medication Sig Start Date End Date Taking? Authorizing Provider  ALPRAZolam (XANAX) 0.25 MG tablet Take 0.25 mg by mouth 3 (three) times daily. 10/14/15   [provider]  benzonatate (TESSALON) 100 MG capsule Take 1 capsule (100 mg total) by mouth every 8 (eight) hours. 03/09/16   Etta Quill, NP  cetirizine (ZYRTEC) 10 MG tablet Take 1 tablet by mouth every morning.  10/09/14   [provider]  diclofenac (VOLTAREN) 75 MG EC tablet Take 1 tablet (75 mg total) by mouth 2 (two) times daily with a meal. 03/04/16   Sanjuana Kava, MD  doxycycline (VIBRA-TABS) 100 MG tablet Take 1 tablet (100 mg total) by mouth 2 (two) times daily. 05/20/16   Daylene Katayama M, DPM  EQUETRO 300 MG CP12 Take  1 capsule by mouth 2 (two) times daily. 10/24/14   [provider]  ferrous sulfate 325 (65 FE) MG tablet Take 1 tablet (325 mg total) by mouth 3 (three) times daily with meals. 11/04/15   Irine Seal, MD  FLUoxetine (PROZAC) 20 MG capsule Take 10 mg by mouth at bedtime.  10/27/14   [provider]  fluticasone (FLONASE) 50 MCG/ACT nasal spray Place 2 sprays into both nostrils daily. 03/28/13   Elisha Headland, FNP  HYDROcodone-acetaminophen (NORCO/VICODIN) 5-325 MG tablet Take 1-2 tablets by mouth every 6 (six) hours as needed for moderate pain. 05/12/16   Edrick Kins, DPM  HYDROcodone-acetaminophen (NORCO/VICODIN) 5-325 MG tablet Take 1 tablet by mouth every 6 (six) hours as needed for moderate pain. 05/18/16   Edrick Kins, DPM   Hydrocodone-Acetaminophen (VICODIN) 5-300 MG TABS Take 1 tablet by mouth every 8 (eight) hours as needed. 04/27/16   Edrick Kins, DPM  HYDROmorphone (DILAUDID) 4 MG tablet Take 1 tablet (4 mg total) by mouth every 6 (six) hours as needed for severe pain. 02/04/16   Fredia Sorrow, MD  LATUDA 40 MG TABS tablet Take 40 mg by mouth at bedtime. 10/16/15   [provider]  lovastatin (MEVACOR) 40 MG tablet Take 40 mg by mouth at bedtime.    [provider]  metFORMIN (GLUCOPHAGE) 500 MG tablet Take 500 mg by mouth 2 (two) times daily with a meal.     [provider]  montelukast (SINGULAIR) 10 MG tablet Take 10 mg by mouth at bedtime. 10/23/15   [provider]  ondansetron (ZOFRAN ODT) 4 MG disintegrating tablet 4mg  ODT q6 hours prn nausea/vomit 03/09/16   Etta Quill, NP  pantoprazole (PROTONIX) 40 MG tablet Take 1 tablet by mouth every evening.  10/09/14   [provider]  PROAIR HFA 108 (90 Base) MCG/ACT inhaler Inhale 1-2 puffs into the lungs every 4 (four) hours as needed for shortness of breath. 10/20/15   [provider]  VYVANSE 70 MG capsule Take 70 mg by mouth daily. 10/14/15   [provider]    Family History Family History  Problem Relation Age of Onset  . Hypertension Mother   . Cancer Mother   . Breast cancer Maternal Aunt   . Lung cancer Maternal Uncle   . Cancer Other     Social History Social History  Substance Use Topics  . Smoking status: Current Every Day Smoker    Packs/day: 0.50    Types: Cigarettes  . Smokeless tobacco: Former Systems developer    Quit date: 09/29/2015     Comment: down to 1/2pp2d from 2 ppd on 09-24-2015  . Alcohol use No     Allergies   Benazepril; Fentanyl; Ibuprofen; Metronidazole; Adhesive [tape]; Codeine; Loratadine; and Penicillins   Review of Systems Review of Systems  HENT: Negative for ear discharge, ear pain and hearing loss.   Skin: Negative for wound.  Neurological: Negative  for headaches.     Physical Exam Updated Vital Signs BP (!) 162/102   Pulse 87   Temp 98 F (36.7 C)   Resp 18   Ht 5\' 1"  (1.549 m)   Wt 86.6 kg (191 lb)   SpO2 100%   BMI 36.09 kg/m   Physical Exam  Constitutional: She is oriented to person, place, and time. She appears well-developed and well-nourished. No distress.  HENT:  Head: Normocephalic and atraumatic.  Bilateral ears without abnormality, no foreign bodies seen in the right or left  ear, normal TMs bilaterally without bulging.  Eyes: Conjunctivae are normal. No scleral icterus.  Neck: Normal range of motion.  Cardiovascular: Normal rate, regular rhythm and normal heart sounds.  Exam reveals no gallop and no friction rub.   No murmur heard. Pulmonary/Chest: Effort normal and breath sounds normal. No respiratory distress.  Abdominal: Soft. Bowel sounds are normal. She exhibits no distension and no mass. There is no tenderness. There is no guarding.  Neurological: She is alert and oriented to person, place, and time.  Skin: Skin is warm and dry. She is not diaphoretic.  Psychiatric: Her behavior is normal.  Nursing note and vitals reviewed.    ED Treatments / Results  Labs (all labs ordered are listed, but only abnormal results are displayed) Labs Reviewed - No data to display  EKG  EKG Interpretation None       Radiology No results found.  Procedures Procedures (including critical care time)  Medications Ordered in ED Medications - No data to display   Initial Impression / Assessment and Plan / ED Course  I have reviewed the triage vital signs and the nursing notes.  Pertinent labs & imaging results that were available during my care of the patient were reviewed by me and considered in my medical decision making (see chart for details).     Patient without evidence of foreign body in the ear. She does have foreign body sensation. No signs of infection. Discussed with Dr. Marybelle Killings who saw the  patient as well, who feels she may start Cipro. She is diabetic. She appears safe for discharge at this time. Up with PCP  Final Clinical Impressions(s) / ED Diagnoses   Final diagnoses:  None    New Prescriptions New Prescriptions   No medications on file     Margarita Mail, PA-C 06/17/16 0128    Ezequiel Essex, MD 06/17/16 [phone]

## 2016-06-22 ENCOUNTER — Ambulatory Visit: Payer: Medicaid Other | Admitting: Podiatry

## 2016-06-25 ENCOUNTER — Telehealth: Payer: Self-pay | Admitting: *Deleted

## 2016-06-25 NOTE — Telephone Encounter (Addendum)
-----   Message from Rufina Falco sent at 06/24/2016  3:16 PM EDT ----- Regarding: [facility] Authorization needed Please fax the mcaid authorization to me for the ABI's scheduled on 07/01/16  [facility]. Rip Harbour - [facility] states [facility] is scheduling far in the future and [facility] is trying to help get pts in sooner to help. Left message for pt to call to discuss possible change of testing location. Left message informing pt I needed her to let me know if she would like to have her testing in [place] at an earlier appt. Left message informing pt to disregard previous messages and to keep 07/01/2016 at 4:00pm.[facility] requested clinicals to evaluate for pre-cert. Faxed clinicals to [facility], Service Order# [id number]/30/2016-Melinda - [facility] called for prior authorization for ABI with TBI scheduled for tomorrow. I gave Charlett Nose ID:  [id number] FOR ABI WITH TBI, VALID 06/25/2016 - 07/25/2016.07/21/2016-Pt requested refill of the hydrocodone and doxycycline. I spoke with pt and she states he foot is swollen and painful, toes look like they are going to explode, and was waiting for circulation test. I told pt I would transfer to the schedulers to get an appt for this week with Dr. Amalia Hailey to evaluate and discuss doppler results, and I would ask Dr. Amalia Hailey to refill the pain medication and possible doxycycline or change antibiotic. I informed pt Dr. Amalia Hailey had ordered refill of the Doxycycline.

## 2016-06-25 NOTE — Telephone Encounter (Signed)
-----   Message from Rufina Falco sent at 06/25/2016  8:56 AM EDT ----- Regarding: RE: [facility] Authorization needed You are right, the order states [facility] but I would have had to been called by your facility because I can't work [facility] order work queue or even see their WQ.   I know at one point [facility] were scheduling way out and we were trying to fit in a few of their orders from referring providers.  This was scheduled so long ago I cannot remember the details.  We will be happy to perform or if you'd rather her go there, no worries here!    Rip Harbour   ----- Message ----- From: Andres Ege, RN Sent: 06/25/2016   8:45 AM To: Gardiner Fanti Reaves Subject: RE: Iver Nestle Authorization needed                 Rip Harbour, [place] sorry to be so dull, but I think this pt was to be scheduled at [facility]. (Just checking because of course Medicaid will need the correct location.) I get on the pre-cert once I get your response. Thanks. Marcy Siren ----- Message ----- From: Rufina Falco Sent: 06/24/2016   3:16 PM To: Andres Ege, RN Subject: [facility] Authorization needed                     Please fax the mcaid authorization to me for the ABI's scheduled on 07/01/16  [facility]

## 2016-07-01 ENCOUNTER — Encounter (HOSPITAL_COMMUNITY): Payer: Medicaid Other

## 2016-07-08 ENCOUNTER — Ambulatory Visit (INDEPENDENT_AMBULATORY_CARE_PROVIDER_SITE_OTHER): Payer: Medicaid Other | Admitting: Urology

## 2016-07-08 ENCOUNTER — Other Ambulatory Visit: Payer: Self-pay | Admitting: Urology

## 2016-07-08 DIAGNOSIS — C670 Malignant neoplasm of trigone of bladder: Secondary | ICD-10-CM | POA: Diagnosis not present

## 2016-07-09 ENCOUNTER — Other Ambulatory Visit: Payer: Self-pay | Admitting: Urology

## 2016-07-14 ENCOUNTER — Ambulatory Visit (HOSPITAL_COMMUNITY)
Admission: RE | Admit: 2016-07-14 | Discharge: 2016-07-14 | Disposition: A | Discharge status: Home / Self Care | Payer: Medicaid Other | Source: Ambulatory Visit | Attending: Podiatry | Admitting: Podiatry

## 2016-07-14 DIAGNOSIS — R600 Localized edema: Secondary | ICD-10-CM | POA: Diagnosis present

## 2016-07-14 DIAGNOSIS — R9439 Abnormal result of other cardiovascular function study: Secondary | ICD-10-CM | POA: Diagnosis not present

## 2016-07-14 DIAGNOSIS — R0989 Other specified symptoms and signs involving the circulatory and respiratory systems: Secondary | ICD-10-CM

## 2016-07-21 ENCOUNTER — Other Ambulatory Visit: Payer: Self-pay | Admitting: Podiatry

## 2016-07-21 ENCOUNTER — Telehealth: Payer: Self-pay | Admitting: Podiatry

## 2016-07-21 MED ORDER — DOXYCYCLINE HYCLATE 100 MG PO TABS: 100.0000 mg | ORAL_TABLET | Freq: Two times a day (BID) | ORAL | 0 refills | Status: DC

## 2016-07-21 MED ORDER — HYDROCODONE-ACETAMINOPHEN 5-325 MG PO TABS: ORAL_TABLET | ORAL | 0 refills | Status: DC

## 2016-07-21 NOTE — Telephone Encounter (Signed)
Pt called back and is scheduled to see Dr Amalia Hailey 7.16.18(monday) offered appt for 7.11.18 and pt stated she cannot come she has to take her daughter to the doctor and the appt for Monday is fine.

## 2016-07-21 NOTE — Telephone Encounter (Signed)
Yes, doxycycline is fine #20 BID. Thanks, Dr. Amalia Hailey

## 2016-07-23 ENCOUNTER — Telehealth: Payer: Self-pay | Admitting: Orthopaedic Surgery

## 2016-07-23 MED ORDER — DICLOFENAC SODIUM 75 MG PO TBEC: 75.0000 mg | DELAYED_RELEASE_TABLET | Freq: Two times a day (BID) | ORAL | 2 refills | Status: DC

## 2016-07-23 NOTE — Telephone Encounter (Signed)
Patient requests refill on Diclofenac Sodium 75 mgs.  Qty 60  With 2 refills  Sig: Take 1 tablet (75 mg total) by mouth 2 (two) times daily with a meal.

## 2016-07-27 ENCOUNTER — Ambulatory Visit (INDEPENDENT_AMBULATORY_CARE_PROVIDER_SITE_OTHER): Payer: Medicaid Other | Admitting: Podiatry

## 2016-07-27 ENCOUNTER — Ambulatory Visit: Payer: Medicaid Other

## 2016-07-27 ENCOUNTER — Encounter: Payer: Self-pay | Admitting: Podiatry

## 2016-07-27 DIAGNOSIS — M659 Synovitis and tenosynovitis, unspecified: Secondary | ICD-10-CM

## 2016-07-27 DIAGNOSIS — R600 Localized edema: Secondary | ICD-10-CM

## 2016-08-02 MED ORDER — BETAMETHASONE SOD PHOS & ACET 6 (3-3) MG/ML IJ SUSP: 3.0000 mg | Freq: Once | INTRAMUSCULAR | Status: DC

## 2016-08-02 NOTE — Progress Notes (Signed)
   HPI: Patient presents today for follow-up treatment and evaluation of ankle pain to the left lower extremity. Patient states that she still continues to get left lower extremity swelling intermittently. She believes it's been worse over the last 2 weeks. Patient presents today for further treatment and evaluation. Last visit vascular studies were ordered.    Physical Exam: General: The patient is alert and oriented x3 in no acute distress.  Dermatology: Skin is warm, dry and supple bilateral lower extremities. Negative for open lesions or macerations.  Vascular: Left lower extremity pitting edema noted. Palpable pedal pulses lateral.  Neurological: Epicritic and protective threshold grossly intact bilaterally.   Musculoskeletal Exam: Pain on palpation to the anterior medial and lateral aspects of the patient's left ankle joint. Range of motion within normal limits to all pedal and ankle joints bilateral. Muscle strength 5/5 in all groups bilateral.   Assessment: 1. Ankle joint synovitis left lower extremity 2. Intermittent left lower extremity edema   Plan of Care:  1. Patient was evaluated. Vascular studies were reviewed in detail with the patient today 2. Injection of 0.5 mL Celestone Soluspan injected in the patient's left ankle joint 3. Compression anklet dispensed today 4. Recommend Revitaderm to her lotion for asymptomatic calluses 5. Return to clinic as needed. If there is no improvement we will order an MRI of the left ankle.   Edrick Kins, DPM [facility]  Dr. Edrick Kins, DPM    [address], [place] [address]                Office [phone]  Fax [phone]

## 2016-08-10 NOTE — Patient Instructions (Signed)
Sandra [name]  08/10/2016     @PREFPERIOPPHARMACY @   Your procedure is scheduled on  08/17/2016 .  Report to [facility] at  615   A.M.  Call this number if you have problems the morning of surgery:  [phone]   Remember:  Do not eat food or drink liquids after midnight.  Take these medicines the morning of surgery with A SIP OF WATER  Xanax, zyrtec or singulair, prozac, hydrocodone or dilaudid, protonix, vyvanse. Use your inhaler before you come. DO NOT take any medications for diabetes the morning of your procedure.   Do not wear jewelry, make-up or nail polish.  Do not wear lotions, powders, or perfumes, or deoderant.  Do not shave 48 hours prior to surgery.  Men may shave face and neck.  Do not bring valuables to the hospital.  [facility] is not responsible for any belongings or valuables.  Contacts, dentures or bridgework may not be worn into surgery.  Leave your suitcase in the car.  After surgery it may be brought to your room.  For patients admitted to the hospital, discharge time will be determined by your treatment team.  Patients discharged the day of surgery will not be allowed to drive home.   Name and phone number of your driver:   family Special instructions:  None  Please read over the following fact sheets that you were given. Anesthesia Post-op Instructions and Care and Recovery After Surgery       Transurethral Resection of Bladder Tumor Transurethral resection of a bladder tumor is the removal (resection) of a cancerous growth (tumor) on the inside wall of the bladder. The bladder is the organ that holds urine. The tumor is removed through the tube that carries urine out of the body (urethra). In a transurethral resection, a thin telescope with a light, a tiny camera, and an electric cutting edge (resectoscope) is passed through the urethra. In men, the opening of the urethra is at the end of the penis. In women, it is just above the  vaginal opening. Tell a health care provider about:  Any allergies you have.  All medicines you are taking, including vitamins, herbs, eye drops, creams, and over-the-counter medicines.  Any problems you or family members have had with anesthetic medicines.  Any blood disorders you have.  Any surgeries you have had.  Any medical conditions you have.  Any recent urinary tract infections you have had.  Whether you are pregnant or may be pregnant. What are the risks? Generally, this is a safe procedure. However, problems may occur, including:  Infection.  Bleeding.  Allergic reactions to medicines.  Damage to other structures or organs, such as: ? The urethra. ? The tubes that drain urine from the kidneys into the bladder (ureters).  Pain and burning during urination.  Difficulty urinating due to partial blockage of the urethra.  Inability to urinate (urinary retention).  What happens before the procedure?  Follow instructions from your health care provider about eating and drinking restrictions.  Ask your health care provider about: ? Changing or stopping your regular medicines. This is especially important if you are taking diabetes medicines or blood thinners. ? Taking medicines such as aspirin and ibuprofen. These medicines can thin your blood. Do not take these medicines before your procedure if your health care provider instructs you not to.  You may have a physical exam.  You may have tests, including: ?  Blood tests. ? Urine tests. ? Electrocardiogram (ECG). This test measures the electrical activity of the heart.  You may be given antibiotic medicine to help prevent infection.  Ask your health care provider how your surgical site will be marked or identified.  Plan to have someone take you home after the procedure. What happens during the procedure?  To reduce your risk of infection: ? Your health care team will wash or sanitize their hands. ? Your  skin will be washed with soap.  An IV tube will be inserted into one of your veins.  You will be given one or more of the following: ? A medicine to help you relax (sedative). ? A medicine to make you fall asleep (general anesthetic). ? A medicine that is injected into your spine to numb the area below and slightly above the injection site (spinal anesthetic).  Your legs will be placed in foot rests (stirrups) so that your legs are apart and your knees are bent.  The resectoscope will be passed through your urethra and into your bladder.  The part of your bladder that is affected by the tumor will be resected using the cutting edge of the resectoscope.  The resectoscope will be removed.  A thin, flexible tube (catheter) will be passed through your urethra and into your bladder. The catheter will drain urine into a bag outside of your body. ? Fluid may be passed through the catheter to keep the catheter open. The procedure may vary among health care providers and hospitals. What happens after the procedure?  Your blood pressure, heart rate, breathing rate, and blood oxygen level will be monitored often until the medicines you were given have worn off.  You may continue to receive fluids and medicines through an IV tube.  You will have some pain. Pain medicine will be available to help you.  You will have a catheter draining your urine. ? You will have blood in your urine. Your catheter may be kept in until your urine is clear. ? Your urinary drainage will be monitored. If necessary, your bladder may be rinsed out (irrigated) by passing fluid through your catheter.  You will be encouraged to walk around as soon as possible.  You may have to wear compression stockings. These stockings help to prevent blood clots and reduce swelling in your legs.  Do not drive for 24 hours if you received a sedative. This information is not intended to replace advice given to you by your health care  provider. Make sure you discuss any questions you have with your health care provider. Document Released: 10/25/2008 Document Revised: 09/01/2015 Document Reviewed: 09/20/2014 Elsevier Interactive Patient Education  2018 [place].  Transurethral Resection of Bladder Tumor, Care After Refer to this sheet in the next few weeks. These instructions provide you with information about caring for yourself after your procedure. Your health care provider may also give you more specific instructions. Your treatment has been planned according to current medical practices, but problems sometimes occur. Call your health care provider if you have any problems or questions after your procedure. What can I expect after the procedure? After the procedure, it is common to have:  A small amount of blood in your urine for up to 2 weeks.  Soreness or mild discomfort from your catheter. After your catheter is removed, you may have mild soreness, especially when urinating.  Pain in your lower abdomen.  Follow these instructions at home:  Medicines  Take over-the-counter and prescription  medicines only as told by your health care provider.  Do not drive or operate heavy machinery while taking prescription pain medicine.  Do not drive for 24 hours if you received a sedative.  If you were prescribed antibiotic medicine, take it as told by your health care provider. Do not stop taking the antibiotic even if you start to feel better. Activity  Return to your normal activities as told by your health care provider. Ask your health care provider what activities are safe for you.  Do not lift anything that is heavier than 10 lb (4.5 kg) for as long as told by your health care provider.  Avoid intense physical activity for as long as told by your health care provider.  Walk at least one time every day. This helps to prevent blood clots. You may increase your physical activity gradually as you start to feel  better. General instructions  Do not drink alcohol for as long as told by your health care provider. This is especially important if you are taking prescription pain medicines.  Do not take baths, swim, or use a hot tub until your health care provider approves.  If you have a catheter, follow instructions from your health care provider about caring for your catheter and your drainage bag.  Drink enough fluid to keep your urine clear or pale yellow.  Wear compression stockings as told by your health care provider. These stockings help to prevent blood clots and reduce swelling in your legs.  Keep all follow-up visits as told by your health care provider. This is important. Contact a health care provider if:  You have pain that gets worse or does not improve with medicine.  You have blood in your urine for more than 2 weeks.  You have cloudy or bad-smelling urine.  You become constipated. Signs of constipation may include having: ? Fewer than three bowel movements in a week. ? Difficulty having a bowel movement. ? Stools that are dry, hard, or larger than normal.  You have a fever. Get help right away if:  You have: ? Severe pain. ? Bright-red blood in your urine. ? Blood clots in your urine. ? A lot of blood in your urine.  Your catheter has been removed and you are not able to urinate.  You have a catheter in place and the catheter is not draining urine. This information is not intended to replace advice given to you by your health care provider. Make sure you discuss any questions you have with your health care provider. Document Released: 12/10/2014 Document Revised: 09/01/2015 Document Reviewed: 09/20/2014 Elsevier Interactive Patient Education  2018 [place] Anesthesia, Adult General anesthesia is the use of medicines to make a person "go to sleep" (be unconscious) for a medical procedure. General anesthesia is often recommended when a procedure:  Is  long.  Requires you to be still or in an unusual position.  Is major and can cause you to lose blood.  Is impossible to do without general anesthesia.  The medicines used for general anesthesia are called general anesthetics. In addition to making you sleep, the medicines:  Prevent pain.  Control your blood pressure.  Relax your muscles.  Tell a health care provider about:  Any allergies you have.  All medicines you are taking, including vitamins, herbs, eye drops, creams, and over-the-counter medicines.  Any problems you or family members have had with anesthetic medicines.  Types of anesthetics you have had in the past.  Any bleeding disorders you have.  Any surgeries you have had.  Any medical conditions you have.  Any history of heart or lung conditions, such as heart failure, sleep apnea, or chronic obstructive pulmonary disease (COPD).  Whether you are pregnant or may be pregnant.  Whether you use tobacco, alcohol, marijuana, or street drugs.  Any history of Armed forces logistics/support/administrative officer.  Any history of depression or anxiety. What are the risks? Generally, this is a safe procedure. However, problems may occur, including:  Allergic reaction to anesthetics.  Lung and heart problems.  Inhaling food or liquids from your stomach into your lungs (aspiration).  Injury to nerves.  Waking up during your procedure and being unable to move (rare).  Extreme agitation or a state of mental confusion (delirium) when you wake up from the anesthetic.  Air in the bloodstream, which can lead to stroke.  These problems are more likely to develop if you are having a major surgery or if you have an advanced medical condition. You can prevent some of these complications by answering all of your health care provider's questions thoroughly and by following all pre-procedure instructions. General anesthesia can cause side effects, including:  Nausea or vomiting  A sore throat from the  breathing tube.  Feeling cold or shivery.  Feeling tired, washed out, or achy.  Sleepiness or drowsiness.  Confusion or agitation.  What happens before the procedure? Staying hydrated Follow instructions from your health care provider about hydration, which may include:  Up to 2 hours before the procedure - you may continue to drink clear liquids, such as water, clear fruit juice, black coffee, and plain tea.  Eating and drinking restrictions Follow instructions from your health care provider about eating and drinking, which may include:  8 hours before the procedure - stop eating heavy meals or foods such as meat, fried foods, or fatty foods.  6 hours before the procedure - stop eating light meals or foods, such as toast or cereal.  6 hours before the procedure - stop drinking milk or drinks that contain milk.  2 hours before the procedure - stop drinking clear liquids.  Medicines  Ask your health care provider about: ? Changing or stopping your regular medicines. This is especially important if you are taking diabetes medicines or blood thinners. ? Taking medicines such as aspirin and ibuprofen. These medicines can thin your blood. Do not take these medicines before your procedure if your health care provider instructs you not to. ? Taking new dietary supplements or medicines. Do not take these during the week before your procedure unless your health care provider approves them.  If you are told to take a medicine or to continue taking a medicine on the day of the procedure, take the medicine with sips of water. General instructions   Ask if you will be going home the same day, the following day, or after a longer hospital stay. ? Plan to have someone take you home. ? Plan to have someone stay with you for the first 24 hours after you leave the hospital or clinic.  For 3-6 weeks before the procedure, try not to use any tobacco products, such as cigarettes, chewing tobacco,  and e-cigarettes.  You may brush your teeth on the morning of the procedure, but make sure to spit out the toothpaste. What happens during the procedure?  You will be given anesthetics through a mask and through an IV tube in one of your veins.  You may receive medicine  to help you relax (sedative).  As soon as you are asleep, a breathing tube may be used to help you breathe.  An anesthesia specialist will stay with you throughout the procedure. He or she will help keep you comfortable and safe by continuing to give you medicines and adjusting the amount of medicine that you get. He or she will also watch your blood pressure, pulse, and oxygen levels to make sure that the anesthetics do not cause any problems.  If a breathing tube was used to help you breathe, it will be removed before you wake up. The procedure may vary among health care providers and hospitals. What happens after the procedure?  You will wake up, often slowly, after the procedure is complete, usually in a recovery area.  Your blood pressure, heart rate, breathing rate, and blood oxygen level will be monitored until the medicines you were given have worn off.  You may be given medicine to help you calm down if you feel anxious or agitated.  If you will be going home the same day, your health care provider may check to make sure you can stand, drink, and urinate.  Your health care providers will treat your pain and side effects before you go home.  Do not drive for 24 hours if you received a sedative.  You may: ? Feel nauseous and vomit. ? Have a sore throat. ? Have mental slowness. ? Feel cold or shivery. ? Feel sleepy. ? Feel tired. ? Feel sore or achy, even in parts of your body where you did not have surgery. This information is not intended to replace advice given to you by your health care provider. Make sure you discuss any questions you have with your health care provider. Document Released: 04/07/2007  Document Revised: 06/11/2015 Document Reviewed: 12/13/2014 Elsevier Interactive Patient Education  2018 [place] Anesthesia, Adult, Care After These instructions provide you with information about caring for yourself after your procedure. Your health care provider may also give you more specific instructions. Your treatment has been planned according to current medical practices, but problems sometimes occur. Call your health care provider if you have any problems or questions after your procedure. What can I expect after the procedure? After the procedure, it is common to have:  Vomiting.  A sore throat.  Mental slowness.  It is common to feel:  Nauseous.  Cold or shivery.  Sleepy.  Tired.  Sore or achy, even in parts of your body where you did not have surgery.  Follow these instructions at home: For at least 24 hours after the procedure:  Do not: ? Participate in activities where you could fall or become injured. ? Drive. ? Use heavy machinery. ? Drink alcohol. ? Take sleeping pills or medicines that cause drowsiness. ? Make important decisions or sign legal documents. ? Take care of children on your own.  Rest. Eating and drinking  If you vomit, drink water, juice, or soup when you can drink without vomiting.  Drink enough fluid to keep your urine clear or pale yellow.  Make sure you have little or no nausea before eating solid foods.  Follow the diet recommended by your health care provider. General instructions  Have a responsible adult stay with you until you are awake and alert.  Return to your normal activities as told by your health care provider. Ask your health care provider what activities are safe for you.  Take over-the-counter and prescription medicines only as told by  your health care provider.  If you smoke, do not smoke without supervision.  Keep all follow-up visits as told by your health care provider. This is  important. Contact a health care provider if:  You continue to have nausea or vomiting at home, and medicines are not helpful.  You cannot drink fluids or start eating again.  You cannot urinate after 8-12 hours.  You develop a skin rash.  You have fever.  You have increasing redness at the site of your procedure. Get help right away if:  You have difficulty breathing.  You have chest pain.  You have unexpected bleeding.  You feel that you are having a life-threatening or urgent problem. This information is not intended to replace advice given to you by your health care provider. Make sure you discuss any questions you have with your health care provider. Document Released: 04/06/2000 Document Revised: 06/03/2015 Document Reviewed: 12/13/2014 Elsevier Interactive Patient Education  [employer].

## 2016-08-13 ENCOUNTER — Other Ambulatory Visit: Payer: Self-pay

## 2016-08-13 ENCOUNTER — Encounter (HOSPITAL_COMMUNITY)
Admission: RE | Admit: 2016-08-13 | Discharge: 2016-08-13 | Disposition: A | Discharge status: Home / Self Care | Payer: Medicaid Other | Source: Ambulatory Visit | Attending: Urology | Admitting: Urology

## 2016-08-13 ENCOUNTER — Encounter (HOSPITAL_COMMUNITY): Payer: Self-pay

## 2016-08-13 DIAGNOSIS — D494 Neoplasm of unspecified behavior of bladder: Secondary | ICD-10-CM | POA: Insufficient documentation

## 2016-08-13 DIAGNOSIS — Z01812 Encounter for preprocedural laboratory examination: Secondary | ICD-10-CM | POA: Insufficient documentation

## 2016-08-13 HISTORY — DX: Essential (primary) hypertension: I10

## 2016-08-13 LAB — CBC WITH DIFFERENTIAL/PLATELET
Basophils Absolute: 0 10*3/uL (ref 0.0–0.1)
Basophils Relative: 0 %
Eosinophils Absolute: 0.1 10*3/uL (ref 0.0–0.7)
Eosinophils Relative: 1 %
HEMATOCRIT: 41 % (ref 36.0–46.0)
HEMOGLOBIN: 12.9 g/dL (ref 12.0–15.0)
LYMPHS PCT: 29 %
Lymphs Abs: 2.6 10*3/uL (ref 0.7–4.0)
MCH: 24.8 pg — ABNORMAL LOW (ref 26.0–34.0)
MCHC: 31.5 g/dL (ref 30.0–36.0)
MCV: 78.8 fL (ref 78.0–100.0)
MONO ABS: 0.5 10*3/uL (ref 0.1–1.0)
MONOS PCT: 6 %
NEUTROS PCT: 64 %
Neutro Abs: 5.7 10*3/uL (ref 1.7–7.7)
Platelets: 391 10*3/uL (ref 150–400)
RBC: 5.2 MIL/uL — ABNORMAL HIGH (ref 3.87–5.11)
RDW: 15.3 % (ref 11.5–15.5)
WBC: 8.9 10*3/uL (ref 4.0–10.5)

## 2016-08-13 LAB — BASIC METABOLIC PANEL
ANION GAP: 11 (ref 5–15)
BUN: 15 mg/dL (ref 6–20)
CHLORIDE: 99 mmol/L — AB (ref 101–111)
CO2: 23 mmol/L (ref 22–32)
Calcium: 9.6 mg/dL (ref 8.9–10.3)
Creatinine, Ser: 0.8 mg/dL (ref 0.44–1.00)
GFR calc Af Amer: >60 mL/min (ref >60)
GFR calc non Af Amer: >60 mL/min (ref >60)
GLUCOSE: 230 mg/dL — AB (ref 65–99)
Potassium: 4.1 mmol/L (ref 3.5–5.1)
Sodium: 133 mmol/L — ABNORMAL LOW (ref 135–145)

## 2016-08-17 ENCOUNTER — Encounter (HOSPITAL_COMMUNITY): Payer: Self-pay | Admitting: Anesthesiology

## 2016-08-17 ENCOUNTER — Encounter (HOSPITAL_COMMUNITY)
Admission: RE | Disposition: A | Discharge status: Home / Self Care | Payer: Self-pay | Source: Ambulatory Visit | Attending: Urology

## 2016-08-17 ENCOUNTER — Ambulatory Visit (HOSPITAL_COMMUNITY): Payer: Medicaid Other | Admitting: Anesthesiology

## 2016-08-17 ENCOUNTER — Ambulatory Visit (HOSPITAL_COMMUNITY)
Admission: RE | Admit: 2016-08-17 | Discharge: 2016-08-17 | Disposition: A | Discharge status: Home / Self Care | Payer: Medicaid Other | Source: Ambulatory Visit | Attending: Urology | Admitting: Urology

## 2016-08-17 DIAGNOSIS — E119 Type 2 diabetes mellitus without complications: Secondary | ICD-10-CM | POA: Insufficient documentation

## 2016-08-17 DIAGNOSIS — Z803 Family history of malignant neoplasm of breast: Secondary | ICD-10-CM | POA: Insufficient documentation

## 2016-08-17 DIAGNOSIS — I10 Essential (primary) hypertension: Secondary | ICD-10-CM | POA: Diagnosis not present

## 2016-08-17 DIAGNOSIS — C675 Malignant neoplasm of bladder neck: Secondary | ICD-10-CM | POA: Diagnosis not present

## 2016-08-17 DIAGNOSIS — F909 Attention-deficit hyperactivity disorder, unspecified type: Secondary | ICD-10-CM | POA: Insufficient documentation

## 2016-08-17 DIAGNOSIS — Z8249 Family history of ischemic heart disease and other diseases of the circulatory system: Secondary | ICD-10-CM | POA: Diagnosis not present

## 2016-08-17 DIAGNOSIS — Z7982 Long term (current) use of aspirin: Secondary | ICD-10-CM | POA: Diagnosis not present

## 2016-08-17 DIAGNOSIS — Z90710 Acquired absence of both cervix and uterus: Secondary | ICD-10-CM | POA: Diagnosis not present

## 2016-08-17 DIAGNOSIS — R310 Gross hematuria: Secondary | ICD-10-CM | POA: Diagnosis not present

## 2016-08-17 DIAGNOSIS — K219 Gastro-esophageal reflux disease without esophagitis: Secondary | ICD-10-CM | POA: Insufficient documentation

## 2016-08-17 DIAGNOSIS — Z7951 Long term (current) use of inhaled steroids: Secondary | ICD-10-CM | POA: Insufficient documentation

## 2016-08-17 DIAGNOSIS — Z801 Family history of malignant neoplasm of trachea, bronchus and lung: Secondary | ICD-10-CM | POA: Insufficient documentation

## 2016-08-17 DIAGNOSIS — Z79899 Other long term (current) drug therapy: Secondary | ICD-10-CM | POA: Insufficient documentation

## 2016-08-17 DIAGNOSIS — Z7984 Long term (current) use of oral hypoglycemic drugs: Secondary | ICD-10-CM | POA: Insufficient documentation

## 2016-08-17 DIAGNOSIS — D494 Neoplasm of unspecified behavior of bladder: Secondary | ICD-10-CM | POA: Diagnosis not present

## 2016-08-17 DIAGNOSIS — F17210 Nicotine dependence, cigarettes, uncomplicated: Secondary | ICD-10-CM | POA: Insufficient documentation

## 2016-08-17 DIAGNOSIS — F319 Bipolar disorder, unspecified: Secondary | ICD-10-CM | POA: Insufficient documentation

## 2016-08-17 DIAGNOSIS — E785 Hyperlipidemia, unspecified: Secondary | ICD-10-CM | POA: Diagnosis not present

## 2016-08-17 DIAGNOSIS — Z8551 Personal history of malignant neoplasm of bladder: Secondary | ICD-10-CM | POA: Insufficient documentation

## 2016-08-17 HISTORY — PX: TRANSURETHRAL RESECTION OF BLADDER TUMOR: SHX2575

## 2016-08-17 LAB — GLUCOSE, CAPILLARY
GLUCOSE-CAPILLARY: 143 mg/dL — AB (ref 65–99)
Glucose-Capillary: 182 mg/dL — ABNORMAL HIGH (ref 65–99)

## 2016-08-17 SURGERY — TURBT (TRANSURETHRAL RESECTION OF BLADDER TUMOR)
Anesthesia: General

## 2016-08-17 MED ORDER — LIDOCAINE HCL 1 % IJ SOLN
INTRAMUSCULAR | Status: DC | PRN
  Administered 2016-08-17: 50 mg via INTRADERMAL

## 2016-08-17 MED ORDER — PROPOFOL 10 MG/ML IV BOLUS
INTRAVENOUS | Status: AC
  Filled 2016-08-17: qty 20

## 2016-08-17 MED ORDER — CEFAZOLIN SODIUM-DEXTROSE 2-4 GM/100ML-% IV SOLN
2.0000 g | Freq: Once | INTRAVENOUS | Status: AC
  Administered 2016-08-17: 2 g via INTRAVENOUS
  Filled 2016-08-17: qty 100

## 2016-08-17 MED ORDER — GLYCOPYRROLATE 0.2 MG/ML IJ SOLN
0.2000 mg | Freq: Once | INTRAMUSCULAR | Status: AC
  Administered 2016-08-17: 0.2 mg via INTRAVENOUS

## 2016-08-17 MED ORDER — FENTANYL CITRATE (PF) 100 MCG/2ML IJ SOLN
25.0000 ug | INTRAMUSCULAR | Status: DC | PRN
  Administered 2016-08-17 (×2): 50 ug via INTRAVENOUS

## 2016-08-17 MED ORDER — DEXAMETHASONE SODIUM PHOSPHATE 10 MG/ML IJ SOLN
INTRAMUSCULAR | Status: DC | PRN
  Administered 2016-08-17: 10 mg via INTRAVENOUS

## 2016-08-17 MED ORDER — ONDANSETRON HCL 4 MG/2ML IJ SOLN
INTRAMUSCULAR | Status: AC
  Filled 2016-08-17: qty 2

## 2016-08-17 MED ORDER — SUCCINYLCHOLINE CHLORIDE 20 MG/ML IJ SOLN
INTRAMUSCULAR | Status: DC | PRN
  Administered 2016-08-17: 100 mg via INTRAVENOUS

## 2016-08-17 MED ORDER — ALBUTEROL SULFATE HFA 108 (90 BASE) MCG/ACT IN AERS
INHALATION_SPRAY | RESPIRATORY_TRACT | Status: DC | PRN
  Administered 2016-08-17: 3 via RESPIRATORY_TRACT

## 2016-08-17 MED ORDER — SODIUM CHLORIDE 0.9 % IR SOLN
Status: DC | PRN
  Administered 2016-08-17: 3000 mL via INTRAVESICAL

## 2016-08-17 MED ORDER — ONDANSETRON HCL 4 MG/2ML IJ SOLN
4.0000 mg | Freq: Once | INTRAMUSCULAR | Status: AC
  Administered 2016-08-17: 4 mg via INTRAVENOUS

## 2016-08-17 MED ORDER — ALBUTEROL SULFATE HFA 108 (90 BASE) MCG/ACT IN AERS
INHALATION_SPRAY | RESPIRATORY_TRACT | Status: AC
  Filled 2016-08-17: qty 6.7

## 2016-08-17 MED ORDER — STERILE WATER FOR IRRIGATION IR SOLN
Status: DC | PRN
  Administered 2016-08-17: 1000 mL

## 2016-08-17 MED ORDER — PROPOFOL 10 MG/ML IV BOLUS
INTRAVENOUS | Status: DC | PRN
  Administered 2016-08-17: 130 mg via INTRAVENOUS
  Administered 2016-08-17: 20 mg via INTRAVENOUS
  Administered 2016-08-17: 100 mg via INTRAVENOUS
  Administered 2016-08-17: 50 mg via INTRAVENOUS

## 2016-08-17 MED ORDER — FENTANYL CITRATE (PF) 100 MCG/2ML IJ SOLN
INTRAMUSCULAR | Status: AC
  Filled 2016-08-17: qty 2

## 2016-08-17 MED ORDER — ONDANSETRON HCL 4 MG/2ML IJ SOLN
INTRAMUSCULAR | Status: DC | PRN
  Administered 2016-08-17: 4 mg via INTRAVENOUS

## 2016-08-17 MED ORDER — GLYCOPYRROLATE 0.2 MG/ML IJ SOLN
INTRAMUSCULAR | Status: AC
  Filled 2016-08-17: qty 1

## 2016-08-17 MED ORDER — FENTANYL CITRATE (PF) 100 MCG/2ML IJ SOLN
INTRAMUSCULAR | Status: DC | PRN
  Administered 2016-08-17: 75 ug via INTRAVENOUS
  Administered 2016-08-17: 25 ug via INTRAVENOUS

## 2016-08-17 MED ORDER — LACTATED RINGERS IV SOLN
INTRAVENOUS | Status: DC
  Administered 2016-08-17 (×2): via INTRAVENOUS

## 2016-08-17 MED ORDER — OXYCODONE-ACETAMINOPHEN 5-325 MG PO TABS: 1.0000 | ORAL_TABLET | ORAL | 0 refills | Status: AC | PRN

## 2016-08-17 MED ORDER — MIDAZOLAM HCL 2 MG/2ML IJ SOLN
1.0000 mg | INTRAMUSCULAR | Status: AC
  Administered 2016-08-17: 2 mg via INTRAVENOUS

## 2016-08-17 MED ORDER — SUCCINYLCHOLINE CHLORIDE 20 MG/ML IJ SOLN
INTRAMUSCULAR | Status: AC
  Filled 2016-08-17: qty 1

## 2016-08-17 MED ORDER — MIDAZOLAM HCL 2 MG/2ML IJ SOLN
INTRAMUSCULAR | Status: AC
  Filled 2016-08-17: qty 2

## 2016-08-17 SURGICAL SUPPLY — 18 items
BAG DRAIN URO TABLE W/ADPT NS (DRAPE) ×3 IMPLANT
BAG HAMPER (MISCELLANEOUS) ×3 IMPLANT
CLOTH BEACON ORANGE TIMEOUT ST (SAFETY) ×3 IMPLANT
ELECT LOOP 22F BIPOLAR SML (ELECTROSURGICAL) ×3
ELECT REM PT RETURN 9FT ADLT (ELECTROSURGICAL) ×3
ELECTRODE LOOP 22F BIPOLAR SML (ELECTROSURGICAL) ×1 IMPLANT
ELECTRODE REM PT RTRN 9FT ADLT (ELECTROSURGICAL) ×1 IMPLANT
FORMALIN 10 PREFIL 120ML (MISCELLANEOUS) ×3 IMPLANT
GLOVE BIO SURGEON STRL SZ8 (GLOVE) ×3 IMPLANT
GOWN STRL REUS W/ TWL LRG LVL3 (GOWN DISPOSABLE) ×1 IMPLANT
GOWN STRL REUS W/TWL LRG LVL3 (GOWN DISPOSABLE) ×2
IV NS IRRIG 3000ML ARTHROMATIC (IV SOLUTION) ×3 IMPLANT
KIT ROOM TURNOVER AP CYSTO (KITS) ×3 IMPLANT
PACK CYSTO (CUSTOM PROCEDURE TRAY) ×3 IMPLANT
PAD ARMBOARD 7.5X6 YLW CONV (MISCELLANEOUS) ×3 IMPLANT
TRAY FOLEY W/METER SILVER 16FR (SET/KITS/TRAYS/PACK) ×3 IMPLANT
WATER STERILE IRR 1000ML POUR (IV SOLUTION) ×3 IMPLANT
YANKAUER SUCT BULB TIP 10FT TU (MISCELLANEOUS) ×3 IMPLANT

## 2016-08-17 NOTE — Discharge Instructions (Addendum)
Acetaminophen; Oxycodone tablets What is this medicine? ACETAMINOPHEN; OXYCODONE (a set a MEE noe fen; ox i KOE done) is a pain reliever. It is used to treat moderate to severe pain. This medicine may be used for other purposes; ask your health care provider or pharmacist if you have questions. COMMON BRAND NAME(S): Endocet, Magnacet, Narvox, Percocet, Perloxx, Primalev, Primlev, Roxicet, Xolox What should I tell my health care provider before I take this medicine? They need to know if you have any of these conditions: -brain tumor -Crohn's disease, inflammatory bowel disease, or ulcerative colitis -drug abuse or addiction -head injury -heart or circulation problems -if you often drink alcohol -kidney disease or problems going to the bathroom -liver disease -lung disease, asthma, or breathing problems -an unusual or allergic reaction to acetaminophen, oxycodone, other opioid analgesics, other medicines, foods, dyes, or preservatives -pregnant or trying to get pregnant -breast-feeding How should I use this medicine? Take this medicine by mouth with a full glass of water. Follow the directions on the prescription label. You can take it with or without food. If it upsets your stomach, take it with food. Take your medicine at regular intervals. Do not take it more often than directed. A special MedGuide will be given to you by the pharmacist with each prescription and refill. Be sure to read this information carefully each time. Talk to your pediatrician regarding the use of this medicine in children. Special care may be needed. Overdosage: If you think you have taken too much of this medicine contact a poison control center or emergency room at once. NOTE: This medicine is only for you. Do not share this medicine with others. What if I miss a dose? If you miss a dose, take it as soon as you can. If it is almost time for your next dose, take only that dose. Do not take double or extra  doses. What may interact with this medicine? This medicine may interact with the following medications: -alcohol -antihistamines for allergy, cough and cold -antiviral medicines for HIV or AIDS -atropine -certain antibiotics like clarithromycin, erythromycin, linezolid, rifampin -certain medicines for anxiety or sleep -certain medicines for bladder problems like oxybutynin, tolterodine -certain medicines for depression like amitriptyline, fluoxetine, sertraline -certain medicines for fungal infections like ketoconazole, itraconazole, voriconazole -certain medicines for migraine headache like almotriptan, eletriptan, frovatriptan, naratriptan, rizatriptan, sumatriptan, zolmitriptan -certain medicines for nausea or vomiting like dolasetron, ondansetron, palonosetron -certain medicines for Parkinson's disease like benztropine, trihexyphenidyl -certain medicines for seizures like phenobarbital, phenytoin, primidone -certain medicines for stomach problems like dicyclomine, hyoscyamine -certain medicines for travel sickness like scopolamine -diuretics -general anesthetics like halothane, isoflurane, methoxyflurane, propofol -ipratropium -local anesthetics like lidocaine, pramoxine, tetracaine -MAOIs like Carbex, Eldepryl, Marplan, Nardil, and Parnate -medicines that relax muscles for surgery -methylene blue -nilotinib -other medicines with acetaminophen -other narcotic medicines for pain or cough -phenothiazines like chlorpromazine, mesoridazine, prochlorperazine, thioridazine This list may not describe all possible interactions. Give your health care provider a list of all the medicines, herbs, non-prescription drugs, or dietary supplements you use. Also tell them if you smoke, drink alcohol, or use illegal drugs. Some items may interact with your medicine. What should I watch for while using this medicine? Tell your doctor or health care professional if your pain does not go away, if it  gets worse, or if you have new or a different type of pain. You may develop tolerance to the medicine. Tolerance means that you will need a higher dose of the medication for pain relief.  Tolerance is normal and is expected if you take this medicine for a long time. Do not suddenly stop taking your medicine because you may develop a severe reaction. Your body becomes used to the medicine. This does NOT mean you are addicted. Addiction is a behavior related to getting and using a drug for a non-medical reason. If you have pain, you have a medical reason to take pain medicine. Your doctor will tell you how much medicine to take. If your doctor wants you to stop the medicine, the dose will be slowly lowered over time to avoid any side effects. There are different types of narcotic medicines (opiates). If you take more than one type at the same time or if you are taking another medicine that also causes drowsiness, you may have more side effects. Give your health care provider a list of all medicines you use. Your doctor will tell you how much medicine to take. Do not take more medicine than directed. Call emergency for help if you have problems breathing or unusual sleepiness. Do not take other medicines that contain acetaminophen with this medicine. Always read labels carefully. If you have questions, ask your doctor or pharmacist. If you take too much acetaminophen get medical help right away. Too much acetaminophen can be very dangerous and cause liver damage. Even if you do not have symptoms, it is important to get help right away. You may get drowsy or dizzy. Do not drive, use machinery, or do anything that needs mental alertness until you know how this medicine affects you. Do not stand or sit up quickly, especially if you are an older patient. This reduces the risk of dizzy or fainting spells. Alcohol may interfere with the effect of this medicine. Avoid alcoholic drinks. The medicine will cause  constipation. Try to have a bowel movement at least every 2 to 3 days. If you do not have a bowel movement for 3 days, call your doctor or health care professional. Your mouth may get dry. Chewing sugarless gum or sucking hard candy, and drinking plenty or water may help. Contact your doctor if the problem does not go away or is severe. What side effects may I notice from receiving this medicine? Side effects that you should report to your doctor or health care professional as soon as possible: -allergic reactions like skin rash, itching or hives, swelling of the face, lips, or tongue -breathing problems -confusion -redness, blistering, peeling or loosening of the skin, including inside the mouth -signs and symptoms of liver injury like dark yellow or brown urine; general ill feeling or flu-like symptoms; light-colored stools; loss of appetite; nausea; right upper belly pain; unusually weak or tired; yellowing of the eyes or skin -signs and symptoms of low blood pressure like dizziness; feeling faint or lightheaded, falls; unusually weak or tired -trouble passing urine or change in the amount of urine Side effects that usually do not require medical attention (report to your doctor or health care professional if they continue or are bothersome): -constipation -dry mouth -nausea, vomiting -tiredness This list may not describe all possible side effects. Call your doctor for medical advice about side effects. You may report side effects to FDA at 1-800-FDA-1088. Where should I keep my medicine? Keep out of the reach of children. This medicine can be abused. Keep your medicine in a safe place to protect it from theft. Do not share this medicine with anyone. Selling or giving away this medicine is dangerous and against the law. This  medicine may cause accidental overdose and death if it taken by other adults, children, or pets. Mix any unused medicine with a substance like cat litter or coffee grounds.  Then throw the medicine away in a sealed container like a sealed bag or a coffee can with a lid. Do not use the medicine after the expiration date. Store at room temperature between 20 and 25 degrees C (68 and 77 degrees F). NOTE: This sheet is a summary. It may not cover all possible information. If you have questions about this medicine, talk to your doctor, pharmacist, or health care provider.  2018 Elsevier/Gold Standard (2014-09-24 21:48:12) Indwelling Urinary Catheter Care, Adult Take good care of your catheter to keep it working and to prevent problems. How to wear your catheter Attach your catheter to your leg with tape (adhesive tape) or a leg strap. Make sure it is not too tight. If you use tape, remove any bits of tape that are already on the catheter. How to wear a drainage bag You should have:  A large overnight bag.  A small leg bag.  Overnight Bag You may wear the overnight bag at any time. Always keep the bag below the level of your bladder but off the floor. When you sleep, put a clean plastic bag in a wastebasket. Then hang the bag inside the wastebasket. Leg Bag Never wear the leg bag at night. Always wear the leg bag below your knee. Keep the leg bag secure with a leg strap or tape. How to care for your skin  Clean the skin around the catheter at least once every day.  Shower every day. Do not take baths.  Put creams, lotions, or ointments on your genital area only as told by your doctor.  Do not use powders, sprays, or lotions on your genital area. How to clean your catheter and your skin 1. Wash your hands with soap and water. 2. Wet a washcloth in warm water and gentle (mild) soap. 3. Use the washcloth to clean the skin where the catheter enters your body. Clean downward and wipe away from the catheter in small circles. Do not wipe toward the catheter. 4. Pat the area dry with a clean towel. Make sure to clean off all soap. How to care for your drainage  bags Empty your drainage bag when it is ?- full or at least 2-3 times a day. Replace your drainage bag once a month or sooner if it starts to smell bad or look dirty. Do not clean your drainage bag unless told by your doctor. Emptying a drainage bag  Supplies Needed  Rubbing alcohol.  Gauze pad or cotton ball.  Tape or a leg strap.  Steps 1. Wash your hands with soap and water. 2. Separate (detach) the bag from your leg. 3. Hold the bag over the toilet or a clean container. Keep the bag below your hips and bladder. This stops pee (urine) from going back into the tube. 4. Open the pour spout at the bottom of the bag. 5. Empty the pee into the toilet or container. Do not let the pour spout touch any surface. 6. Put rubbing alcohol on a gauze pad or cotton ball. 7. Use the gauze pad or cotton ball to clean the pour spout. 8. Close the pour spout. 9. Attach the bag to your leg with tape or a leg strap. 10. Wash your hands.  Changing a drainage bag Supplies Needed  Alcohol wipes.  A clean drainage bag.  Adhesive tape or a leg strap.  Steps 1. Wash your hands with soap and water. 2. Separate the dirty bag from your leg. 3. Pinch the rubber catheter with your fingers so that pee does not spill out. 4. Separate the catheter tube from the drainage tube where these tubes connect (at the connection valve). Do not let the tubes touch any surface. 5. Clean the end of the catheter tube with an alcohol wipe. Use a different alcohol wipe to clean the end of the drainage tube. 6. Connect the catheter tube to the drainage tube of the clean bag. 7. Attach the new bag to the leg with adhesive tape or a leg strap. 8. Wash your hands.  How to prevent infection and other problems  Never pull on your catheter or try to remove it. Pulling can damage tissue in your body.  Always wash your hands before and after touching your catheter.  If a leg strap gets wet, replace it with a dry  one.  Drink enough fluids to keep your pee clear or pale yellow, or as told by your doctor.  Do not let the drainage bag or tubing touch the floor.  Wear cotton underwear.  If you are female, wipe from front to back after you poop (have a bowel movement).  Check on the catheter often to make sure it works and the tubing is not twisted. Get help if:  Your pee is cloudy.  Your pee smells unusually bad.  Your pee is not draining into the bag.  Your tube gets clogged.  Your catheter starts to leak.  Your bladder feels full. Get help right away if:  You have redness, swelling, or pain where the catheter enters your body.  You have fluid, pus, or a bad smell coming from the area where the catheter enters your body.  The area where the catheter enters your body feels warm.  You have a fever.  You have pain in your: ? Stomach (abdomen). ? Legs. ? Lower back. ? Bladder.  You see blood fill the catheter.  Your pee is pink or red.  You feel sick to your stomach (nauseous).  You throw up (vomit).  You have chills.  Your catheter gets pulled out. This information is not intended to replace advice given to you by your health care provider. Make sure you discuss any questions you have with your health care provider. Document Released: 04/25/2012 Document Revised: 11/27/2015 Document Reviewed: 06/13/2013 Elsevier Interactive Patient Education  2018 [place] POST-ANESTHESIA  IMMEDIATELY FOLLOWING SURGERY:  Do not drive or operate machinery for the first twenty four hours after surgery.  Do not make any important decisions for twenty four hours after surgery or while taking narcotic pain medications or sedatives.  If you develop intractable nausea and vomiting or a severe headache please notify your doctor immediately.  FOLLOW-UP:  Please make an appointment with your surgeon as instructed. You do not need to follow up with anesthesia unless  specifically instructed to do so.  WOUND CARE INSTRUCTIONS (if applicable):  Keep a dry clean dressing on the anesthesia/puncture wound site if there is drainage.  Once the wound has quit draining you may leave it open to air.  Generally you should leave the bandage intact for twenty four hours unless there is drainage.  If the epidural site drains for more than 36-48 hours please call the anesthesia department.  QUESTIONS?:  Please feel free to call your physician or the  hospital operator if you have any questions, and they will be happy to assist you.

## 2016-08-17 NOTE — Op Note (Signed)
.  Preoperative diagnosis: bladder tumor  Postoperative diagnosis: Same  Procedure: 1 cystoscopy 2. 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  Ureteral orifices in normal anatomic location.   Indications: Patient is a 41 year old female with a history of bladder tumor and gross hematuria.  After discussing treatment options, they decided proceed with transurethral resection of a bladder tumor.  Procedure her in detail: The patient was brought to the operating room and a brief timeout was done to ensure correct patient, correct procedure, correct site.  General anesthesia was administered patient was placed in dorsal lithotomy position.  Their genitalia was then prepped and draped in usual sterile fashion.  A rigid 56 French cystoscope was passed in the urethra and the bladder.  Bladder was inspected and we noted multiple 0.5cm bladder tumors at the bladder neck.  the ureteral orifices were in the normal orthotopic locations.  Using the bipolar resectoscope we removed the bladder tumor down to the base. A subsequent muscle deep biopsy was then taken. Hemostasis was then obtained with electrocautery. We then removed the bladder tumor chips and sent them for pathology. We then re-inspected the bladder and found no residula bleeding.  the bladder was then drained, a 22 French foley was placed and this concluded the procedure which was well tolerated by patient.  Complications: None  Condition: Stable, extubated, transferred to PACU  Plan: Patient is to be discharged home and followup in 5 days for foley catheter removal and pathology discussion.

## 2016-08-17 NOTE — Transfer of Care (Signed)
Immediate Anesthesia Transfer of Care Note  Patient: Sandra [name]  Procedure(s) Performed: Procedure(s): TRANSURETHRAL RESECTION OF BLADDER TUMOR (TURBT) (N/A)  Patient Location: PACU  Anesthesia Type:General  Level of Consciousness: awake, alert , oriented and patient cooperative  Airway & Oxygen Therapy: Patient Spontanous Breathing and Patient connected to face mask oxygen  Post-op Assessment: Report given to RN and Post -op Vital signs reviewed and stable  Post vital signs: Reviewed and stable  Last Vitals:  Vitals:   08/17/16 0745 08/17/16 0841  BP:    Pulse:    Resp: (!) 21   Temp:  36.4 C    Last Pain:  Vitals:   08/17/16 0654  TempSrc: Oral  PainSc: 7       Patients Stated Pain Goal: 4 (38/17/71 1657)  Complications: No apparent anesthesia complications

## 2016-08-17 NOTE — Anesthesia Postprocedure Evaluation (Signed)
Anesthesia Post Note  Patient: Sandra [name]  Procedure(s) Performed: Procedure(s) (LRB): TRANSURETHRAL RESECTION OF BLADDER TUMOR (TURBT) (N/A)  Patient location during evaluation: PACU Anesthesia Type: General Level of consciousness: awake and alert, oriented and patient cooperative Pain management: pain level controlled Vital Signs Assessment: post-procedure vital signs reviewed and stable Respiratory status: spontaneous breathing Cardiovascular status: blood pressure returned to baseline and stable Postop Assessment: no headache, adequate PO intake and no signs of nausea or vomiting Anesthetic complications: no     Last Vitals:  Vitals:   08/17/16 0745 08/17/16 0841  BP:    Pulse:    Resp: (!) 21   Temp:  36.4 C    Last Pain:  Vitals:   08/17/16 0654  TempSrc: Oral  PainSc: 7                  Elara Cocke

## 2016-08-17 NOTE — H&P (Signed)
Urology Admission H&P  Chief Complaint: bladder tumor  History of Present Illness: Ms Tokar is a 41yo with a hx of bladder cancer and bladder tumor found on office cystoscopy  Past Medical History:  Diagnosis Date  . ADHD (attention deficit hyperactivity disorder)   . Allergic rhinitis   . Anemia    hx of  . Bipolar 1 disorder ([place])   . Bladder tumor   . Cancer ([place])   . Dizziness   . Full dentures   . GERD (gastroesophageal reflux disease)   . Gross hematuria   . Hyperlipidemia   . Hypertension   . Type 2 diabetes mellitus ([place])   . Urgency of urination    dysuria, sui  . Wears glasses    Past Surgical History:  Procedure Laterality Date  . CYSTOSCOPY N/A 10/03/2015   Procedure: CYSTOSCOPY;  Surgeon: Irine Seal, MD;  Location: [facility];  Service: Urology;  Laterality: N/A;  . CYSTOSCOPY WITH STENT PLACEMENT Right 10/31/2015   Procedure: CYSTOSCOPY WITH ATTEMPTED RIGHT URETERAL OPENING;  Surgeon: Irine Seal, MD;  Location: [facility];  Service: Urology;  Laterality: Right;  . MULTIPLE TOOTH EXTRACTIONS  2015  . OVARIAN CYST REMOVAL Right 2004 approx  . TONSILLECTOMY  12/30/2004  . TOTAL ABDOMINAL HYSTERECTOMY W/ BILATERAL SALPINGOOPHORECTOMY  05/16/2004  . TRANSURETHRAL RESECTION OF BLADDER TUMOR N/A 10/03/2015   Procedure: TRANSURETHRAL RESECTION OF BLADDER TUMOR (TURBT);  Surgeon: Irine Seal, MD;  Location: [facility];  Service: Urology;  Laterality: N/A;  . TRANSURETHRAL RESECTION OF BLADDER TUMOR N/A 10/31/2015   Procedure: RE-STAGINGG TRANSURETHRAL RESECTION OF BLADDER TUMOR (TURBT);  Surgeon: Irine Seal, MD;  Location: [facility];  Service: Urology;  Laterality: N/A;    Home Medications:  Facility-Administered Medications Prior to Admission  Medication Dose Route Frequency Provider Last Rate Last Dose  . betamethasone acetate-betamethasone sodium phosphate (CELESTONE) injection 3 mg  3 mg  Intramuscular Once Edrick Kins, DPM       Prescriptions Prior to Admission  Medication Sig Dispense Refill Last Dose  . ALPRAZolam (XANAX) 1 MG tablet Take 0.5-1 mg by mouth 4 (four) times daily. Take 1 tablet 3 times daily & 0.5 tablet at bedtime.  1 08/16/2016 at 2000  . amphetamine-dextroamphetamine (ADDERALL) 30 MG tablet Take 30 mg by mouth daily at 3 pm. 1600  0 08/16/2016 at 2000  . aspirin-acetaminophen-caffeine (EXCEDRIN MIGRAINE) 250-250-65 MG tablet Take 2 tablets by mouth 3 (three) times daily as needed for headache.   Past Week at Unknown time  . benzonatate (TESSALON) 100 MG capsule Take 1 capsule (100 mg total) by mouth every 8 (eight) hours. 21 capsule 0   . cetirizine (ZYRTEC) 10 MG tablet Take 10 mg by mouth daily.   2 08/16/2016 at 2000  . cloNIDine (CATAPRES) 0.1 MG tablet Take 0.1 mg by mouth 2 (two) times daily.  1 08/16/2016 at 2000  . EQUETRO 300 MG CP12 Take 300 mg by mouth 2 (two) times daily.   0 08/16/2016 at 2000  . FLUoxetine (PROZAC) 20 MG capsule Take 20 mg by mouth at bedtime.   0 08/16/2016 at 2000  . gabapentin (NEURONTIN) 300 MG capsule Take 600 mg by mouth 3 (three) times daily.  0 08/16/2016 at 2000  . lovastatin (MEVACOR) 40 MG tablet Take 40 mg by mouth at bedtime.   08/16/2016 at 2000  . metFORMIN (GLUCOPHAGE) 500 MG tablet Take 500 mg by mouth 2 (two) times daily with a  meal.    08/16/2016 at 2000  . pantoprazole (PROTONIX) 40 MG tablet Take 40 mg by mouth at bedtime.   2 08/16/2016 at 2000  . PROAIR HFA 108 (90 Base) MCG/ACT inhaler Inhale 1-2 puffs into the lungs every 4 (four) hours as needed for shortness of breath.  0 08/16/2016 at 2000  . VYVANSE 70 MG capsule Take 70 mg by mouth daily with breakfast.   0 08/17/2016 at 0800  . ciprofloxacin-hydrocortisone (CIPRO HC) OTIC suspension Place 3 drops into the right ear 2 (two) times daily. (Patient not taking: Reported on 08/11/2016) 10 mL 0 Not Taking at Unknown time  . diclofenac (VOLTAREN) 75 MG EC tablet Take 1 tablet (75  mg total) by mouth 2 (two) times daily with a meal. (Patient not taking: Reported on 08/11/2016) 60 tablet 2 Not Taking at Unknown time  . doxycycline (VIBRA-TABS) 100 MG tablet Take 1 tablet (100 mg total) by mouth 2 (two) times daily. (Patient not taking: Reported on 08/13/2016) 20 tablet 0 Not Taking at Unknown time  . HYDROcodone-acetaminophen (NORCO/VICODIN) 5-325 MG tablet Take 1-2 tablets by mouth every 6 (six) hours as needed for moderate pain. (Patient not taking: Reported on 08/11/2016) 30 tablet 0 Not Taking at Unknown time  . HYDROcodone-acetaminophen (NORCO/VICODIN) 5-325 MG tablet Take 1 or 2 tablets every 6 hours prn foot pain. 30 tablet 0   . Hydrocodone-Acetaminophen (VICODIN) 5-300 MG TABS Take 1 tablet by mouth every 8 (eight) hours as needed. (Patient not taking: Reported on 08/11/2016) 20 each 0 Not Taking at Unknown time  . ondansetron (ZOFRAN ODT) 4 MG disintegrating tablet 4mg  ODT q6 hours prn nausea/vomit (Patient not taking: Reported on 08/11/2016) 8 tablet 0 Not Taking at Unknown time   Allergies:  See list  Family History  Problem Relation Age of Onset  . Hypertension Mother   . Cancer Mother   . Breast cancer Maternal Aunt   . Lung cancer Maternal Uncle   . Cancer Other    Social History:  reports that she has been smoking Cigarettes.  She has been smoking about 0.50 packs per day. She quit smokeless tobacco use about 10 months ago. She reports that she does not drink alcohol or use drugs.  Review of Systems  All other systems reviewed and are negative.   Physical Exam:  Vital signs in last 24 hours: Temp:  [98.2 F (36.8 C)] 98.2 F (36.8 C) (08/06 0654) Pulse Rate:  [58] 58 (08/06 0654) Resp:  [16-60] 20 (08/06 0710) BP: (136-150)/(80-90) 150/90 (08/06 0710) SpO2:  [97 %-98 %] 98 % (08/06 0710) Weight:  [86.6 kg (191 lb)] 86.6 kg (191 lb) (08/06 0654) Physical Exam  Constitutional: She is oriented to person, place, and time. She appears well-developed and  well-nourished.  HENT:  Head: Normocephalic and atraumatic.  Eyes: Pupils are equal, round, and reactive to light. EOM are normal.  Neck: Normal range of motion. No thyromegaly present.  Cardiovascular: Normal rate and regular rhythm.   Respiratory: Effort normal. No respiratory distress.  GI: Soft. She exhibits no distension.  Musculoskeletal: Normal range of motion. She exhibits no edema.  Neurological: She is alert and oriented to person, place, and time.  Skin: Skin is warm and dry.  Psychiatric: She has a normal mood and affect. Her behavior is normal. Judgment and thought content normal.    Laboratory Data:  Results for orders placed or performed during the hospital encounter of 08/17/16 (from the past 24 hour(s))  Glucose, capillary  Status: Abnormal   Collection Time: 08/17/16  6:45 AM  Result Value Ref Range   Glucose-Capillary 143 (H) 65 - 99 mg/dL   No results found for this or any previous visit (from the past 240 hour(s)). Creatinine:  Recent Labs  08/13/16 1100  CREATININE 0.80   Baseline Creatinine: 0.8  Impression/Assessment:  41yo with bladder tumor  Plan:  The risks/benefits/alternatives to TURBT was explained to the patient and she understands and wishes to proceded with surgery  [name] 08/17/2016, 7:40 AM

## 2016-08-17 NOTE — Anesthesia Preprocedure Evaluation (Signed)
Anesthesia Evaluation  Patient identified by MRN, date of birth, ID band Patient awake    Reviewed: Allergy & Precautions, NPO status , Patient's Chart, lab work & pertinent test results  Airway Mallampati: II  TM Distance: >3 FB Neck ROM: Full    Dental  (+) Edentulous Lower, Edentulous Upper   Pulmonary Current Smoker, former smoker,    Pulmonary exam normal breath sounds clear to auscultation       Cardiovascular hypertension, negative cardio ROS Normal cardiovascular exam Rhythm:Regular Rate:Normal     Neuro/Psych PSYCHIATRIC DISORDERS  Neuromuscular disease    GI/Hepatic Neg liver ROS, GERD  Medicated,  Endo/Other  diabetes, Type 2, Oral Hypoglycemic Agents  Renal/GU negative Renal ROS  negative genitourinary   Musculoskeletal negative musculoskeletal ROS (+)   Abdominal (+) + obese,   Peds negative pediatric ROS (+)  Hematology  (+) anemia ,   Anesthesia Other Findings   Reproductive/Obstetrics negative OB ROS                             Anesthesia Physical Anesthesia Plan  ASA: III  Anesthesia Plan: General   Post-op Pain Management:    Induction: Intravenous  PONV Risk Score and Plan:   Airway Management Planned: LMA  Additional Equipment:   Intra-op Plan:   Post-operative Plan: Extubation in OR  Informed Consent: I have reviewed the patients History and Physical, chart, labs and discussed the procedure including the risks, benefits and alternatives for the proposed anesthesia with the patient or authorized representative who has indicated his/her understanding and acceptance.   Dental advisory given  Plan Discussed with: CRNA  Anesthesia Plan Comments:         Anesthesia Quick Evaluation

## 2016-08-18 ENCOUNTER — Encounter (HOSPITAL_COMMUNITY): Payer: Self-pay | Admitting: Urology

## 2016-08-19 ENCOUNTER — Encounter (HOSPITAL_COMMUNITY): Payer: Self-pay | Admitting: Cardiology

## 2016-08-19 ENCOUNTER — Emergency Department (HOSPITAL_COMMUNITY)
Admission: EM | Admit: 2016-08-19 | Discharge: 2016-08-19 | Disposition: A | Discharge status: Home / Self Care | Payer: Medicaid Other | Attending: Emergency Medicine | Admitting: Emergency Medicine

## 2016-08-19 DIAGNOSIS — N3289 Other specified disorders of bladder: Secondary | ICD-10-CM | POA: Diagnosis not present

## 2016-08-19 DIAGNOSIS — R102 Pelvic and perineal pain: Secondary | ICD-10-CM | POA: Diagnosis present

## 2016-08-19 DIAGNOSIS — F17210 Nicotine dependence, cigarettes, uncomplicated: Secondary | ICD-10-CM | POA: Insufficient documentation

## 2016-08-19 DIAGNOSIS — I10 Essential (primary) hypertension: Secondary | ICD-10-CM | POA: Diagnosis not present

## 2016-08-19 DIAGNOSIS — Z7984 Long term (current) use of oral hypoglycemic drugs: Secondary | ICD-10-CM | POA: Diagnosis not present

## 2016-08-19 DIAGNOSIS — E119 Type 2 diabetes mellitus without complications: Secondary | ICD-10-CM | POA: Diagnosis not present

## 2016-08-19 DIAGNOSIS — Z79899 Other long term (current) drug therapy: Secondary | ICD-10-CM | POA: Insufficient documentation

## 2016-08-19 NOTE — ED Triage Notes (Signed)
Had transurethral resection of tumor Monday.  Increasing pain since surgery.  Pt has foley catheter with clear yellow urine.

## 2016-08-19 NOTE — ED Provider Notes (Signed)
[facility] DEPT Provider Note   CSN: [id number] Arrival date & time: 08/19/16  1412     History   Chief Complaint Chief Complaint  Patient presents with  . Follow-up    HPI Sandra [name] is a 41 y.o. female.  For evaluation of suprapubic pain, characterized by a sharp feeling, present for several days, following removal of bladder tumors, by urology, 3 days ago.  She denies fever, chills, shortness of breath, weakness or dizziness.  She states she has been itching, following use of oxycodone.  She is due to have the catheter removed, in 2 days time.  She requests use of hydromorphone, for her pain.  There are no other known modifying factors.  HPI  Past Medical History:  Diagnosis Date  . ADHD (attention deficit hyperactivity disorder)   . Allergic rhinitis   . Anemia    hx of  . Bipolar 1 disorder ([place])   . Bladder tumor   . Cancer ([place])   . Dizziness   . Full dentures   . GERD (gastroesophageal reflux disease)   . Gross hematuria   . Hyperlipidemia   . Hypertension   . Type 2 diabetes mellitus ([place])   . Urgency of urination    dysuria, sui  . Wears glasses     Patient Active Problem List   Diagnosis Date Noted  . Postoperative anemia due to acute blood loss 11/04/2015  . Bladder cancer ([place]) 10/31/2015  . CARPAL TUNNEL SYNDROME 10/02/2009    Past Surgical History:  Procedure Laterality Date  . CYSTOSCOPY N/A 10/03/2015   Procedure: CYSTOSCOPY;  Surgeon: Irine Seal, MD;  Location: [facility];  Service: Urology;  Laterality: N/A;  . CYSTOSCOPY WITH STENT PLACEMENT Right 10/31/2015   Procedure: CYSTOSCOPY WITH ATTEMPTED RIGHT URETERAL OPENING;  Surgeon: Irine Seal, MD;  Location: [facility];  Service: Urology;  Laterality: Right;  . MULTIPLE TOOTH EXTRACTIONS  2015  . OVARIAN CYST REMOVAL Right 2004 approx  . TONSILLECTOMY  12/30/2004  . TOTAL ABDOMINAL HYSTERECTOMY W/ BILATERAL SALPINGOOPHORECTOMY  05/16/2004  .  TRANSURETHRAL RESECTION OF BLADDER TUMOR N/A 10/03/2015   Procedure: TRANSURETHRAL RESECTION OF BLADDER TUMOR (TURBT);  Surgeon: Irine Seal, MD;  Location: [facility];  Service: Urology;  Laterality: N/A;  . TRANSURETHRAL RESECTION OF BLADDER TUMOR N/A 10/31/2015   Procedure: RE-STAGINGG TRANSURETHRAL RESECTION OF BLADDER TUMOR (TURBT);  Surgeon: Irine Seal, MD;  Location: [facility];  Service: Urology;  Laterality: N/A;  . TRANSURETHRAL RESECTION OF BLADDER TUMOR N/A 08/17/2016   Procedure: TRANSURETHRAL RESECTION OF BLADDER TUMOR (TURBT);  Surgeon: Cleon Gustin, MD;  Location: [facility];  Service: Urology;  Laterality: N/A;    OB History    Gravida Para Term Preterm AB Living   2         2   SAB TAB Ectopic Multiple Live Births                   Home Medications    Prior to Admission medications   Medication Sig Start Date End Date Taking? Authorizing Provider  ALPRAZolam Duanne Moron) 1 MG tablet Take 0.5-1 mg by mouth 4 (four) times daily. Take 1 tablet 3 times daily & 0.5 tablet at bedtime. 07/07/16  Yes [provider]  amphetamine-dextroamphetamine (ADDERALL) 30 MG tablet Take 30 mg by mouth daily at 3 pm. 1600 08/07/16  Yes [provider]  aspirin-acetaminophen-caffeine (EXCEDRIN MIGRAINE) 518-841-66 MG tablet Take 2 tablets by mouth 3 (  three) times daily as needed for headache.   Yes [provider]  benzonatate (TESSALON) 100 MG capsule Take 1 capsule (100 mg total) by mouth every 8 (eight) hours. Patient taking differently: Take 100 mg by mouth every 8 (eight) hours as needed for cough.  03/09/16  Yes Etta Quill, NP  cetirizine (ZYRTEC) 10 MG tablet Take 10 mg by mouth daily.  10/09/14  Yes [provider]  cloNIDine (CATAPRES) 0.1 MG tablet Take 0.1 mg by mouth 2 (two) times daily. 07/24/16  Yes [provider]  EQUETRO 300 MG CP12 Take 300 mg by mouth 2 (two) times daily.  10/24/14  Yes [provider]  FLUoxetine (PROZAC) 20 MG capsule Take 20 mg by mouth at bedtime.  10/27/14  Yes [provider]  gabapentin (NEURONTIN) 300 MG capsule Take 600 mg by mouth 3 (three) times daily. 07/05/16  Yes [provider]  lovastatin (MEVACOR) 40 MG tablet Take 40 mg by mouth at bedtime.   Yes [provider]  metFORMIN (GLUCOPHAGE) 500 MG tablet Take 500 mg by mouth 2 (two) times daily with a meal.    Yes [provider]  oxyCODONE-acetaminophen (ROXICET) 5-325 MG tablet Take 1 tablet by mouth every 4 (four) hours as needed. 08/17/16 08/17/17 Yes McKenzie, Candee Furbish, MD  pantoprazole (PROTONIX) 40 MG tablet Take 40 mg by mouth at bedtime.  10/09/14  Yes [provider]  PROAIR HFA 108 (90 Base) MCG/ACT inhaler Inhale 1-2 puffs into the lungs every 4 (four) hours as needed for shortness of breath. 10/20/15  Yes [provider]  VYVANSE 70 MG capsule Take 70 mg by mouth daily with breakfast.  10/14/15  Yes [provider]    Family History Family History  Problem Relation Age of Onset  . Hypertension Mother   . Cancer Mother   . Breast cancer Maternal Aunt   . Lung cancer Maternal Uncle   . Cancer Other     Social History Social History  Substance Use Topics  . Smoking status: Current Every Day Smoker    Packs/day: 0.50    Types: Cigarettes  . Smokeless tobacco: Former Systems developer    Quit date: 09/29/2015     Comment: down to 1/2pp2d from 2 ppd on 09-24-2015  . Alcohol use No     Allergies   Benazepril; Fentanyl; Ibuprofen; Metronidazole; Oxycodone; Adhesive [tape]; Codeine; Loratadine; and Penicillins   Review of Systems Review of Systems  All other systems reviewed and are negative.    Physical Exam Updated Vital Signs BP (!) 145/74   Pulse (!) 53   Temp 97.9 F (36.6 C) (Oral)   Resp 16   Ht 5\' 1"  (1.549 m)   Wt 86.6 kg (191 lb)   SpO2 97%   BMI 36.09 kg/m   Physical Exam  Constitutional: She is oriented to  person, place, and time. She appears well-developed.  Morbidly obese  HENT:  Head: Normocephalic and atraumatic.  Eyes: Pupils are equal, round, and reactive to light. Conjunctivae and EOM are normal.  Neck: Normal range of motion and phonation normal. Neck supple.  Cardiovascular: Normal rate and regular rhythm.   Pulmonary/Chest: Effort normal and breath sounds normal. She exhibits no tenderness.  Abdominal: Soft. She exhibits no distension. There is tenderness (Suprapubic, mild). There is no guarding.  Genitourinary:  Genitourinary Comments: Yellow urine in Foley catheter bag.  Musculoskeletal: Normal range of motion.  Neurological: She is alert and oriented to person, place, and time.  She exhibits normal muscle tone.  Skin: Skin is warm and dry.  Psychiatric: She has a normal mood and affect. Her behavior is normal. Judgment and thought content normal.  Nursing note and vitals reviewed.    ED Treatments / Results  Labs (all labs ordered are listed, but only abnormal results are displayed) Labs Reviewed - No data to display  EKG  EKG Interpretation None       Radiology No results found.  Procedures Procedures (including critical care time)  Medications Ordered in ED Medications - No data to display   Initial Impression / Assessment and Plan / ED Course  I have reviewed the triage vital signs and the nursing notes.  Pertinent labs & imaging results that were available during my care of the patient were reviewed by me and considered in my medical decision making (see chart for details).      Patient Vitals for the past 24 hrs:  BP Temp Temp src Pulse Resp SpO2 Height Weight  08/19/16 1848 (!) 145/74 - - (!) 53 16 97 % - -  08/19/16 1419 - - - - - - 5\' 1"  (1.549 m) 86.6 kg (191 lb)  08/19/16 1418 - 97.9 F (36.6 C) Oral 64 16 99 % - -   Case discussed with on-call urology, who recommended removal urinary catheter, to control bladder spasm.  9:16 PM  Reevaluation with update and discussion. After initial assessment and treatment, an updated evaluation reveals pain resolved after removal of urinary bladder catheter. Lomax Poehler L    Final Clinical Impressions(s) / ED Diagnoses   Final diagnoses:  Bladder spasm   Abdominal pain likely secondary to bladder spasm.  Doubt urinary tract infection hematuria, or metabolic instability.  Pain controlled after removal of Foley catheter  Nursing Notes Reviewed/ Care Coordinated Applicable Imaging Reviewed Interpretation of Laboratory Data incorporated into ED treatment  The patient appears reasonably screened and/or stabilized for discharge and I doubt any other medical condition or other [facility] requiring further screening, evaluation, or treatment in the ED at this time prior to discharge.  Plan: Home Medications-continue usual medications; Home Treatments-rest, gradually advance diet; return here if the recommended treatment, does not improve the symptoms; Recommended follow up-PCP and Urology as needed   New Prescriptions New Prescriptions   No medications on file     Daleen Bo, MD 08/20/16 [phone]

## 2016-08-19 NOTE — Discharge Instructions (Signed)
See your urologist this week as scheduled

## 2016-08-19 NOTE — ED Notes (Signed)
Bladder scan performed less than 37ml in bladder

## 2016-08-19 NOTE — ED Notes (Signed)
Catheter removed per Dr. Eulis Foster order,pt up to bathroom

## 2016-08-21 ENCOUNTER — Ambulatory Visit (INDEPENDENT_AMBULATORY_CARE_PROVIDER_SITE_OTHER): Payer: Medicaid Other | Admitting: Urology

## 2016-08-21 DIAGNOSIS — C670 Malignant neoplasm of trigone of bladder: Secondary | ICD-10-CM

## 2016-08-26 ENCOUNTER — Encounter: Payer: Self-pay | Admitting: Podiatry

## 2016-08-26 ENCOUNTER — Ambulatory Visit (INDEPENDENT_AMBULATORY_CARE_PROVIDER_SITE_OTHER): Payer: Medicaid Other | Admitting: Urology

## 2016-08-26 DIAGNOSIS — C670 Malignant neoplasm of trigone of bladder: Secondary | ICD-10-CM

## 2016-09-17 ENCOUNTER — Ambulatory Visit: Payer: Self-pay | Admitting: Orthopaedic Surgery

## 2016-09-23 ENCOUNTER — Ambulatory Visit (INDEPENDENT_AMBULATORY_CARE_PROVIDER_SITE_OTHER): Payer: Medicaid Other | Admitting: Urology

## 2016-09-23 DIAGNOSIS — C670 Malignant neoplasm of trigone of bladder: Secondary | ICD-10-CM | POA: Diagnosis not present

## 2016-09-29 ENCOUNTER — Ambulatory Visit (INDEPENDENT_AMBULATORY_CARE_PROVIDER_SITE_OTHER): Payer: Medicaid Other | Admitting: Urology

## 2016-09-29 DIAGNOSIS — C670 Malignant neoplasm of trigone of bladder: Secondary | ICD-10-CM

## 2016-10-09 ENCOUNTER — Ambulatory Visit (INDEPENDENT_AMBULATORY_CARE_PROVIDER_SITE_OTHER): Payer: Medicaid Other | Admitting: Urology

## 2016-10-09 ENCOUNTER — Other Ambulatory Visit (HOSPITAL_COMMUNITY): Payer: Self-pay | Admitting: Family

## 2016-10-09 DIAGNOSIS — C670 Malignant neoplasm of trigone of bladder: Secondary | ICD-10-CM | POA: Diagnosis not present

## 2016-10-09 DIAGNOSIS — R52 Pain, unspecified: Secondary | ICD-10-CM

## 2016-10-14 ENCOUNTER — Ambulatory Visit (INDEPENDENT_AMBULATORY_CARE_PROVIDER_SITE_OTHER): Payer: Medicaid Other | Admitting: Urology

## 2016-10-14 DIAGNOSIS — C670 Malignant neoplasm of trigone of bladder: Secondary | ICD-10-CM

## 2016-10-21 ENCOUNTER — Telehealth: Payer: Self-pay | Admitting: Orthopaedic Surgery

## 2016-10-21 ENCOUNTER — Ambulatory Visit (INDEPENDENT_AMBULATORY_CARE_PROVIDER_SITE_OTHER): Payer: Medicaid Other | Admitting: Urology

## 2016-10-21 DIAGNOSIS — C670 Malignant neoplasm of trigone of bladder: Secondary | ICD-10-CM | POA: Diagnosis not present

## 2016-10-21 MED ORDER — NAPROXEN 500 MG PO TABS: 500.0000 mg | ORAL_TABLET | Freq: Two times a day (BID) | ORAL | 5 refills | Status: DC

## 2016-10-21 NOTE — Telephone Encounter (Signed)
Patient came to office with another patient and has requested a refill from [facility] for medication prescribed by Dr Luna Glasgow for her shoulder.  Thinks it was for inflammation.  Patient has not been seen since 04/08/16.  Please advise.

## 2016-10-21 NOTE — Telephone Encounter (Signed)
Patient states spoke with Dr Luna Glasgow directly, and medication is to be prescribed - to be sent to [facility] [place], [place] for Naproxen

## 2016-10-23 MED ORDER — DOXYCYCLINE HYCLATE 100 MG PO CAPS: 100.0000 mg | ORAL_CAPSULE | Freq: Two times a day (BID) | ORAL | 0 refills | Status: DC

## 2016-10-28 ENCOUNTER — Ambulatory Visit (INDEPENDENT_AMBULATORY_CARE_PROVIDER_SITE_OTHER): Payer: Medicaid Other | Admitting: Urology

## 2016-10-28 DIAGNOSIS — C670 Malignant neoplasm of trigone of bladder: Secondary | ICD-10-CM

## 2016-10-29 ENCOUNTER — Telehealth (HOSPITAL_COMMUNITY): Payer: Self-pay | Admitting: Psychiatry
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
## 2016-12-17 ENCOUNTER — Encounter (HOSPITAL_COMMUNITY): Payer: Self-pay | Admitting: Psychiatry

## 2016-12-17 ENCOUNTER — Ambulatory Visit (INDEPENDENT_AMBULATORY_CARE_PROVIDER_SITE_OTHER): Payer: Medicaid Other | Admitting: Psychiatry

## 2016-12-17 VITALS — BP 142/84 | HR 95 | Ht 61.0 in | Wt 180.0 lb

## 2016-12-17 DIAGNOSIS — G4700 Insomnia, unspecified: Secondary | ICD-10-CM | POA: Diagnosis not present

## 2016-12-17 DIAGNOSIS — F419 Anxiety disorder, unspecified: Secondary | ICD-10-CM | POA: Diagnosis not present

## 2016-12-17 DIAGNOSIS — F17210 Nicotine dependence, cigarettes, uncomplicated: Secondary | ICD-10-CM | POA: Diagnosis not present

## 2016-12-17 DIAGNOSIS — Z62810 Personal history of physical and sexual abuse in childhood: Secondary | ICD-10-CM

## 2016-12-17 DIAGNOSIS — R450 Nervousness: Secondary | ICD-10-CM | POA: Diagnosis not present

## 2016-12-17 DIAGNOSIS — E119 Type 2 diabetes mellitus without complications: Secondary | ICD-10-CM | POA: Diagnosis not present

## 2016-12-17 DIAGNOSIS — F909 Attention-deficit hyperactivity disorder, unspecified type: Secondary | ICD-10-CM | POA: Diagnosis not present

## 2016-12-17 DIAGNOSIS — I10 Essential (primary) hypertension: Secondary | ICD-10-CM

## 2016-12-17 DIAGNOSIS — F331 Major depressive disorder, recurrent, moderate: Secondary | ICD-10-CM

## 2016-12-17 DIAGNOSIS — F411 Generalized anxiety disorder: Secondary | ICD-10-CM | POA: Insufficient documentation

## 2016-12-17 MED ORDER — LISDEXAMFETAMINE DIMESYLATE 60 MG PO CAPS: 60.0000 mg | ORAL_CAPSULE | ORAL | 0 refills | Status: DC

## 2016-12-17 MED ORDER — EQUETRO 300 MG PO CP12: 300.0000 mg | ORAL_CAPSULE | Freq: Two times a day (BID) | ORAL | 0 refills | Status: DC

## 2016-12-17 MED ORDER — FLUOXETINE HCL 40 MG PO CAPS: 40.0000 mg | ORAL_CAPSULE | Freq: Every day | ORAL | 0 refills | Status: DC

## 2016-12-17 MED ORDER — LORAZEPAM 1 MG PO TABS: 1.0000 mg | ORAL_TABLET | Freq: Three times a day (TID) | ORAL | 0 refills | Status: DC | PRN

## 2016-12-17 NOTE — Patient Instructions (Signed)
1. Increase fluoxetine 40 mg daily  2. Continue Equetro 300 mg twice a day  3. Decrease Vyvanse 60 mg daily  4. Discontinue Xanax 5. Start ativan 1 mg three times a day as needed for anxiety  6. Return to clinic in one month for 30 mins 7. Referral for evaluation of ADHD
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
## 2017-01-15 ENCOUNTER — Other Ambulatory Visit (HOSPITAL_COMMUNITY)
Admission: RE | Admit: 2017-01-15 | Discharge: 2017-01-15 | Disposition: A | Discharge status: Home / Self Care | Payer: Medicaid Other | Source: Ambulatory Visit | Attending: Urology | Admitting: Urology

## 2017-01-15 ENCOUNTER — Ambulatory Visit: Payer: Medicaid Other | Admitting: Urology

## 2017-01-15 DIAGNOSIS — Z8551 Personal history of malignant neoplasm of bladder: Secondary | ICD-10-CM

## 2017-01-18 ENCOUNTER — Ambulatory Visit (HOSPITAL_COMMUNITY): Payer: Medicaid Other | Admitting: Psychiatry

## 2017-02-09 DIAGNOSIS — G8929 Other chronic pain: Secondary | ICD-10-CM | POA: Insufficient documentation

## 2017-02-09 DIAGNOSIS — M9904 Segmental and somatic dysfunction of sacral region: Secondary | ICD-10-CM | POA: Insufficient documentation

## 2017-02-19 ENCOUNTER — Other Ambulatory Visit: Payer: Self-pay | Admitting: Orthopaedic Surgery

## 2017-02-23 DIAGNOSIS — M533 Sacrococcygeal disorders, not elsewhere classified: Secondary | ICD-10-CM | POA: Insufficient documentation

## 2017-02-23 NOTE — Telephone Encounter (Signed)
Called patient, relayed. °

## 2017-02-23 NOTE — Telephone Encounter (Signed)
Cannot fill until seen.  After her admission in August she was told not to take it.

## 2017-05-21 ENCOUNTER — Ambulatory Visit: Payer: Medicaid Other | Admitting: Urology

## 2017-05-21 DIAGNOSIS — C670 Malignant neoplasm of trigone of bladder: Secondary | ICD-10-CM

## 2017-06-23 ENCOUNTER — Ambulatory Visit (INDEPENDENT_AMBULATORY_CARE_PROVIDER_SITE_OTHER): Payer: Medicaid Other | Admitting: Urology

## 2017-06-23 DIAGNOSIS — C670 Malignant neoplasm of trigone of bladder: Secondary | ICD-10-CM

## 2017-06-30 ENCOUNTER — Ambulatory Visit (INDEPENDENT_AMBULATORY_CARE_PROVIDER_SITE_OTHER): Payer: Medicaid Other | Admitting: Urology

## 2017-06-30 DIAGNOSIS — C670 Malignant neoplasm of trigone of bladder: Secondary | ICD-10-CM | POA: Diagnosis not present

## 2017-07-07 ENCOUNTER — Ambulatory Visit (INDEPENDENT_AMBULATORY_CARE_PROVIDER_SITE_OTHER): Payer: Medicaid Other | Admitting: Urology

## 2017-07-07 DIAGNOSIS — C670 Malignant neoplasm of trigone of bladder: Secondary | ICD-10-CM

## 2017-08-27 ENCOUNTER — Other Ambulatory Visit (HOSPITAL_COMMUNITY)
Admission: RE | Admit: 2017-08-27 | Discharge: 2017-08-27 | Disposition: A | Discharge status: Home / Self Care | Payer: Medicaid Other | Source: Ambulatory Visit | Attending: Urology | Admitting: Urology

## 2017-08-27 ENCOUNTER — Ambulatory Visit (INDEPENDENT_AMBULATORY_CARE_PROVIDER_SITE_OTHER): Payer: Medicaid Other | Admitting: Urology

## 2017-08-27 DIAGNOSIS — N3941 Urge incontinence: Secondary | ICD-10-CM

## 2017-08-27 DIAGNOSIS — Z8551 Personal history of malignant neoplasm of bladder: Secondary | ICD-10-CM | POA: Diagnosis not present

## 2017-09-08 ENCOUNTER — Other Ambulatory Visit: Payer: Self-pay | Admitting: Urology

## 2017-09-08 DIAGNOSIS — Z8551 Personal history of malignant neoplasm of bladder: Secondary | ICD-10-CM

## 2017-10-07 ENCOUNTER — Ambulatory Visit (HOSPITAL_COMMUNITY)
Admission: RE | Admit: 2017-10-07 | Discharge: 2017-10-07 | Disposition: A | Discharge status: Home / Self Care | Payer: Medicaid Other | Source: Ambulatory Visit | Attending: Urology | Admitting: Urology

## 2017-10-07 DIAGNOSIS — R160 Hepatomegaly, not elsewhere classified: Secondary | ICD-10-CM | POA: Diagnosis not present

## 2017-10-07 DIAGNOSIS — Z8551 Personal history of malignant neoplasm of bladder: Secondary | ICD-10-CM

## 2017-10-07 DIAGNOSIS — I700 Atherosclerosis of aorta: Secondary | ICD-10-CM | POA: Insufficient documentation

## 2017-10-07 LAB — POCT I-STAT CREATININE: Creatinine, Ser: 0.5 mg/dL (ref 0.44–1.00)

## 2017-10-07 MED ORDER — IOPAMIDOL (ISOVUE-300) INJECTION 61%
100.0000 mL | Freq: Once | INTRAVENOUS | Status: AC | PRN
  Administered 2017-10-07: 100 mL via INTRAVENOUS

## 2017-10-11 ENCOUNTER — Other Ambulatory Visit: Payer: Self-pay

## 2017-10-11 ENCOUNTER — Emergency Department (HOSPITAL_COMMUNITY)
Admission: EM | Admit: 2017-10-11 | Discharge: 2017-10-11 | Disposition: A | Discharge status: Home / Self Care | Payer: Medicaid Other | Attending: Emergency Medicine | Admitting: Emergency Medicine

## 2017-10-11 ENCOUNTER — Encounter (HOSPITAL_COMMUNITY): Payer: Self-pay | Admitting: Emergency Medicine

## 2017-10-11 ENCOUNTER — Emergency Department (HOSPITAL_COMMUNITY): Payer: Medicaid Other

## 2017-10-11 DIAGNOSIS — R590 Localized enlarged lymph nodes: Secondary | ICD-10-CM | POA: Diagnosis not present

## 2017-10-11 DIAGNOSIS — Z9221 Personal history of antineoplastic chemotherapy: Secondary | ICD-10-CM | POA: Insufficient documentation

## 2017-10-11 DIAGNOSIS — I10 Essential (primary) hypertension: Secondary | ICD-10-CM | POA: Diagnosis not present

## 2017-10-11 DIAGNOSIS — Z8551 Personal history of malignant neoplasm of bladder: Secondary | ICD-10-CM | POA: Diagnosis not present

## 2017-10-11 DIAGNOSIS — F17210 Nicotine dependence, cigarettes, uncomplicated: Secondary | ICD-10-CM | POA: Insufficient documentation

## 2017-10-11 DIAGNOSIS — R221 Localized swelling, mass and lump, neck: Secondary | ICD-10-CM | POA: Diagnosis present

## 2017-10-11 DIAGNOSIS — Z79899 Other long term (current) drug therapy: Secondary | ICD-10-CM | POA: Diagnosis not present

## 2017-10-11 DIAGNOSIS — E1165 Type 2 diabetes mellitus with hyperglycemia: Secondary | ICD-10-CM | POA: Diagnosis not present

## 2017-10-11 DIAGNOSIS — Z7984 Long term (current) use of oral hypoglycemic drugs: Secondary | ICD-10-CM | POA: Diagnosis not present

## 2017-10-11 DIAGNOSIS — R739 Hyperglycemia, unspecified: Secondary | ICD-10-CM

## 2017-10-11 LAB — BASIC METABOLIC PANEL
ANION GAP: 12 (ref 5–15)
BUN: 9 mg/dL (ref 6–20)
CO2: 22 mmol/L (ref 22–32)
Calcium: 9.1 mg/dL (ref 8.9–10.3)
Chloride: 96 mmol/L — ABNORMAL LOW (ref 98–111)
Creatinine, Ser: 0.82 mg/dL (ref 0.44–1.00)
GFR calc Af Amer: >60 mL/min (ref >60)
GLUCOSE: 533 mg/dL — AB (ref 70–99)
POTASSIUM: 4.4 mmol/L (ref 3.5–5.1)
Sodium: 130 mmol/L — ABNORMAL LOW (ref 135–145)

## 2017-10-11 LAB — CBC WITH DIFFERENTIAL/PLATELET
BASOS ABS: 0 10*3/uL (ref 0.0–0.1)
Basophils Relative: 0 %
Eosinophils Absolute: 0.1 10*3/uL (ref 0.0–0.7)
Eosinophils Relative: 1 %
HCT: 45.5 % (ref 36.0–46.0)
Hemoglobin: 15 g/dL (ref 12.0–15.0)
LYMPHS PCT: 36 %
Lymphs Abs: 2.8 10*3/uL (ref 0.7–4.0)
MCH: 27.1 pg (ref 26.0–34.0)
MCHC: 33 g/dL (ref 30.0–36.0)
MCV: 82.1 fL (ref 78.0–100.0)
Monocytes Absolute: 0.6 10*3/uL (ref 0.1–1.0)
Monocytes Relative: 7 %
NEUTROS PCT: 56 %
Neutro Abs: 4.3 10*3/uL (ref 1.7–7.7)
Platelets: 269 10*3/uL (ref 150–400)
RBC: 5.54 MIL/uL — AB (ref 3.87–5.11)
RDW: 13.1 % (ref 11.5–15.5)
WBC: 7.8 10*3/uL (ref 4.0–10.5)

## 2017-10-11 LAB — CBG MONITORING, ED
GLUCOSE-CAPILLARY: 396 mg/dL — AB (ref 70–99)
GLUCOSE-CAPILLARY: 291 mg/dL — AB (ref 70–99)

## 2017-10-11 MED ORDER — TRAMADOL HCL 50 MG PO TABS: 50.0000 mg | ORAL_TABLET | Freq: Four times a day (QID) | ORAL | 0 refills | Status: DC | PRN

## 2017-10-11 MED ORDER — SODIUM CHLORIDE 0.9 % IV BOLUS
1000.0000 mL | Freq: Once | INTRAVENOUS | Status: AC
  Administered 2017-10-11: 1000 mL via INTRAVENOUS

## 2017-10-11 MED ORDER — HYDROCODONE-ACETAMINOPHEN 5-325 MG PO TABS
1.0000 | ORAL_TABLET | Freq: Once | ORAL | Status: AC
  Administered 2017-10-11: 1 via ORAL
  Filled 2017-10-11: qty 1

## 2017-10-11 MED ORDER — IOPAMIDOL (ISOVUE-300) INJECTION 61%: 100.0000 mL | Freq: Once | INTRAVENOUS | Status: DC | PRN

## 2017-10-11 MED ORDER — SULFAMETHOXAZOLE-TRIMETHOPRIM 800-160 MG PO TABS: 1.0000 | ORAL_TABLET | Freq: Two times a day (BID) | ORAL | 0 refills | Status: AC

## 2017-10-11 MED ORDER — IOHEXOL 300 MG/ML  SOLN
75.0000 mL | Freq: Once | INTRAMUSCULAR | Status: AC | PRN
  Administered 2017-10-11: 75 mL via INTRAVENOUS

## 2017-10-11 MED ORDER — KETOROLAC TROMETHAMINE 30 MG/ML IJ SOLN
15.0000 mg | Freq: Once | INTRAMUSCULAR | Status: AC
  Administered 2017-10-11: 15 mg via INTRAVENOUS
  Filled 2017-10-11: qty 1

## 2017-10-11 MED ORDER — INSULIN ASPART 100 UNIT/ML ~~LOC~~ SOLN
8.0000 [IU] | Freq: Once | SUBCUTANEOUS | Status: AC
  Administered 2017-10-11: 8 [IU] via SUBCUTANEOUS
  Filled 2017-10-11: qty 1

## 2017-10-11 NOTE — ED Provider Notes (Signed)
[facility] Provider Note   CSN: [id number] Arrival date & time: 10/11/17  0630     History   Chief Complaint Chief Complaint  Patient presents with  . Oral Swelling    HPI Sandra [name] is a 42 y.o. female with past medical history as outlined below, most significant for history of diabetes, GERD, hypertension and bladder the cancer which is followed by [facility] and has been effectively treated with chemotherapy and has completed this course, presenting with swelling of her neck which started several days ago which she reports is tender and constant without radiation of pain.  She denies difficulty swallowing, also no fevers or chills, denies dental pain or mouth swelling.  She is edentulous.  She was seen by her PCP and was advised to report here for CT scan as there is concern the swelling may be associated with her cancer diagnosis.  She has had no treatment prior to arrival.  The history is provided by the patient.    Past Medical History:  Diagnosis Date  . ADHD (attention deficit hyperactivity disorder)   . Allergic rhinitis   . Anemia    hx of  . Bipolar 1 disorder ([place])   . Bladder tumor   . Cancer ([place])    bladder  . Dizziness   . Full dentures   . GERD (gastroesophageal reflux disease)   . Gross hematuria   . Hyperlipidemia   . Hypertension   . Type 2 diabetes mellitus ([place])   . Urgency of urination    dysuria, sui  . Wears glasses     Patient Active Problem List   Diagnosis Date Noted  . GAD (generalized anxiety disorder) 12/17/2016  . MDD (major depressive disorder), recurrent episode, moderate ([place]) 12/17/2016  . Attention deficit hyperactivity disorder (ADHD) 12/17/2016  . Postoperative anemia due to acute blood loss 11/04/2015  . Bladder cancer ([place]) 10/31/2015  . CARPAL TUNNEL SYNDROME 10/02/2009    Past Surgical History:  Procedure Laterality Date  . CYSTOSCOPY N/A 10/03/2015   Procedure: CYSTOSCOPY;  Surgeon:  Irine Seal, MD;  Location: [facility]  Service: Urology;  Laterality: N/A;  . CYSTOSCOPY WITH STENT PLACEMENT Right 10/31/2015   Procedure: CYSTOSCOPY WITH ATTEMPTED RIGHT URETERAL OPENING;  Surgeon: Irine Seal, MD;  Location: [facility];  Service: Urology;  Laterality: Right;  . MULTIPLE TOOTH EXTRACTIONS  2015  . OVARIAN CYST REMOVAL Right 2004 approx  . TONSILLECTOMY  12/30/2004  . TOTAL ABDOMINAL HYSTERECTOMY W/ BILATERAL SALPINGOOPHORECTOMY  05/16/2004  . TRANSURETHRAL RESECTION OF BLADDER TUMOR N/A 10/03/2015   Procedure: TRANSURETHRAL RESECTION OF BLADDER TUMOR (TURBT);  Surgeon: Irine Seal, MD;  Location[facility];  Service: Urology;  Laterality: N/A;  . TRANSURETHRAL RESECTION OF BLADDER TUMOR N/A 10/31/2015   Procedure: RE-STAGINGG TRANSURETHRAL RESECTION OF BLADDER TUMOR (TURBT);  Surgeon: Irine Seal, MD;  Location: [facility];  Service: Urology;  Laterality: N/A;  . TRANSURETHRAL RESECTION OF BLADDER TUMOR N/A 08/17/2016   Procedure: TRANSURETHRAL RESECTION OF BLADDER TUMOR (TURBT);  Surgeon: Cleon Gustin, MD;  Location: [facility];  Service: Urology;  Laterality: N/A;     OB History    Gravida  2   Para      Term      Preterm      AB      Living  2     SAB      TAB      Ectopic  Multiple      Live Births               Home Medications    Prior to Admission medications   Medication Sig Start Date End Date Taking? Authorizing Provider  amphetamine-dextroamphetamine (ADDERALL) 30 MG tablet Take 30 mg by mouth daily.  09/21/17  Yes [provider]  aspirin-acetaminophen-caffeine (EXCEDRIN MIGRAINE) [phone] MG tablet Take 2 tablets by mouth 3 (three) times daily as needed for headache.   Yes [provider]  benzonatate (TESSALON) 100 MG capsule Take 1 capsule (100 mg total) by mouth every 8 (eight) hours. Patient taking differently: Take 100 mg by mouth every 8  (eight) hours as needed for cough.  03/09/16  Yes Etta Quill, NP  cetirizine (ZYRTEC) 10 MG tablet Take 10 mg by mouth daily.  10/09/14  Yes [provider]  cloNIDine (CATAPRES) 0.1 MG tablet Take 0.1 mg by mouth 2 (two) times daily. 07/24/16  Yes [provider]  EQUETRO 300 MG CP12 Take 1 capsule (300 mg total) by mouth 2 (two) times daily. 12/17/16  Yes Norman Clay, MD  FLUoxetine (PROZAC) 40 MG capsule Take 1 capsule (40 mg total) by mouth daily. 12/17/16  Yes Hisada, Elie Goody, MD  fluticasone (FLONASE) 50 MCG/ACT nasal spray fluticasone propionate 50 mcg/actuation nasal spray,suspension   Yes [provider]  gabapentin (NEURONTIN) 300 MG capsule Take 600 mg by mouth 3 (three) times daily. 07/05/16  Yes [provider]  lisdexamfetamine (VYVANSE) 70 MG capsule Take 70 mg by mouth daily.   Yes [provider]  LORazepam (ATIVAN) 1 MG tablet Take 1 tablet (1 mg total) by mouth 3 (three) times daily as needed for anxiety. 12/17/16  Yes Hisada, Elie Goody, MD  lovastatin (MEVACOR) 40 MG tablet Take 40 mg by mouth at bedtime.   Yes [provider]  metFORMIN (GLUCOPHAGE) 500 MG tablet Take 500 mg by mouth 2 (two) times daily with a meal.    Yes [provider]  montelukast (SINGULAIR) 10 MG tablet Take 10 mg by mouth at bedtime.  08/16/17  Yes [provider]  pantoprazole (PROTONIX) 40 MG tablet Take 40 mg by mouth at bedtime.  10/09/14  Yes [provider]  PROAIR HFA 108 (90 Base) MCG/ACT inhaler Inhale 1-2 puffs into the lungs every 4 (four) hours as needed for shortness of breath. 10/20/15  Yes [provider]  lisdexamfetamine (VYVANSE) 60 MG capsule Take 1 capsule (60 mg total) by mouth every morning. Patient not taking: Reported on 10/11/2017 12/17/16   Norman Clay, MD  sulfamethoxazole-trimethoprim (BACTRIM DS,SEPTRA DS) 800-160 MG tablet Take 1 tablet by mouth 2 (two) times daily for 10 days. 10/11/17 10/21/17  Evalee Jefferson, PA-C  traMADol (ULTRAM) 50 MG tablet Take 1 tablet (50 mg total) by mouth every 6 (six) hours as needed. 10/11/17   Evalee Jefferson, PA-C    Family History Family History  Problem Relation Age of Onset  . Hypertension Mother   . Cancer Mother   . Breast cancer Maternal Aunt   . Lung cancer Maternal Uncle   . Cancer Other     Social History Social History   Tobacco Use  . Smoking status: Current Every Day Smoker    Packs/day: 0.50    Types: Cigarettes  . Smokeless tobacco: Never Used  Substance Use Topics  . Alcohol use: No  . Drug use: No     Allergies   Benazepril; Ibuprofen; Metronidazole; Oxycodone; Adhesive [tape]; Codeine; Loratadine;  and Penicillins   Review of Systems Review of Systems  Constitutional: Negative for fever.  HENT: Negative for congestion, postnasal drip, rhinorrhea, sinus pain, sore throat, trouble swallowing and voice change.        Negative except as mentioned in HPI.   Eyes: Negative.   Respiratory: Negative for chest tightness and shortness of breath.   Cardiovascular: Negative for chest pain.  Gastrointestinal: Negative for abdominal pain and nausea.  Genitourinary: Negative.   Musculoskeletal: Negative for arthralgias, joint swelling and neck pain.  Skin: Negative.  Negative for rash and wound.  Neurological: Negative for dizziness, weakness, light-headedness, numbness and headaches.  Psychiatric/Behavioral: Negative.      Physical Exam Updated Vital Signs BP (!) 142/70 (BP Location: Right Arm)   Pulse (!) 57   Temp 97.7 F (36.5 C) (Oral)   Resp 16   Ht 4\' 11"  (1.499 m)   Wt 69.9 kg   SpO2 97%   BMI 31.10 kg/m   Physical Exam  Constitutional: She appears well-developed and well-nourished.  HENT:  Head: Normocephalic and atraumatic.  Right Ear: External ear normal.  Left Ear: External ear normal.  Nose: Nose normal.  Mouth/Throat: Oropharynx is clear and moist.  Eyes: Conjunctivae are normal.  Neck: Normal range of  motion.  Cardiovascular: Normal rate, regular rhythm, normal heart sounds and intact distal pulses.  Pulmonary/Chest: Effort normal and breath sounds normal. She has no wheezes.  Abdominal: Soft. Bowel sounds are normal. There is no tenderness.  Musculoskeletal: Normal range of motion.  Lymphadenopathy:       Head (left side): Submental and submandibular adenopathy present.  Neurological: She is alert.  Skin: Skin is warm and dry.  Psychiatric: She has a normal mood and affect.  Nursing note and vitals reviewed.    ED Treatments / Results  Labs (all labs ordered are listed, but only abnormal results are displayed) Labs Reviewed  CBC WITH DIFFERENTIAL/PLATELET - Abnormal; Notable for the following components:      Result Value   RBC 5.54 (*)    All other components within normal limits  BASIC METABOLIC PANEL - Abnormal; Notable for the following components:   Sodium 130 (*)    Chloride 96 (*)    Glucose, Bld 533 (*)    All other components within normal limits  CBG MONITORING, ED - Abnormal; Notable for the following components:   Glucose-Capillary 396 (*)    All other components within normal limits    EKG None  Radiology Ct Soft Tissue Neck W Contrast  Result Date: 10/11/2017 CLINICAL DATA:  Swelling since yesterday. History of bladder cancer treated with chemotherapy. EXAM: CT NECK WITH CONTRAST TECHNIQUE: Multidetector CT imaging of the neck was performed using the standard protocol following the bolus administration of intravenous contrast. CONTRAST:  51mL OMNIPAQUE IOHEXOL 300 MG/ML  SOLN COMPARISON:  None. FINDINGS: Pharynx and larynx: No mucosal or submucosal lesion. Salivary glands: Parotid and submandibular glands are normal. Thyroid: Normal Lymph nodes: Skin marker overlies the submandibular region on the left. There are a few small level 1 lymph nodes and there is mild edematous stranding of the regional fat, which could go along with lymphadenitis. There is no  evidence of a discrete mass, cyst or drainable abscess. Other lymph nodes in the neck are normal. Vascular: Normal Limited intracranial: Normal Visualized orbits: Normal Mastoids and visualized paranasal sinuses: Clear Skeleton: Mid cervical spondylosis. Upper chest: Normal Other: None IMPRESSION: Slightly prominent level 1/submandibular lymph nodes more on the left than the  right with mild edematous stranding of the adjacent fat. This could go along with lymphadenitis. I do not see evidence of a drainable fluid collection or abscess. No evidence of an underlying mass lesion. Electronically Signed   By: Nelson Chimes M.D.   On: 10/11/2017 13:09    Procedures Procedures (including critical care time)  Medications Ordered in ED Medications  iopamidol (ISOVUE-300) 61 % injection 100 mL (has no administration in time range)  ketorolac (TORADOL) 30 MG/ML injection 15 mg (15 mg Intravenous Given 10/11/17 1140)  sodium chloride 0.9 % bolus 1,000 mL (0 mLs Intravenous Stopped 10/11/17 1354)  insulin aspart (novoLOG) injection 8 Units (8 Units Subcutaneous Given 10/11/17 1233)  iohexol (OMNIPAQUE) 300 MG/ML solution 75 mL (75 mLs Intravenous Contrast Given 10/11/17 1241)  HYDROcodone-acetaminophen (NORCO/VICODIN) 5-325 MG per tablet 1 tablet (1 tablet Oral Given 10/11/17 1313)  sodium chloride 0.9 % bolus 1,000 mL (1,000 mLs Intravenous New Bag/Given 10/11/17 1355)     Initial Impression / Assessment and Plan / ED Course  I have reviewed the triage vital signs and the nursing notes.  Pertinent labs & imaging results that were available during my care of the patient were reviewed by me and considered in my medical decision making (see chart for details).     Patient was given subcu regular insulin along with 2 L of normal saline and she had improving blood glucose level prior to discharge home.  She had no anion gap, no evidence for DKA.  CT imaging was reviewed and discussed with patient.  She was placed on  an anti-inflammatory and antibiotics for suspected either inflammatory or infected submandibular lymphadenopathy.  Plan follow-up with her PCP for recheck if symptoms persist or worsen.  Final Clinical Impressions(s) / ED Diagnoses   Final diagnoses:  Hyperglycemia  Submandibular lymphadenopathy    ED Discharge Orders         Ordered    traMADol (ULTRAM) 50 MG tablet  Every 6 hours PRN     10/11/17 1454    sulfamethoxazole-trimethoprim (BACTRIM DS,SEPTRA DS) 800-160 MG tablet  2 times daily     10/11/17 1458           Evalee Jefferson, PA-C 10/11/17 [place], MD 10/11/17 [phone]

## 2017-10-11 NOTE — ED Notes (Signed)
Pt noted to have mild swelling to just below chin and down neck. Pt reports pain and difficulty swallowing. No swelling noted in throat. No problems with secretions

## 2017-10-11 NOTE — ED Triage Notes (Signed)
Pt states has swelling and knot in throat for past couple of days. Pt reports PCP "thinks its coming from my cancer and wants me to have a CT scan." NAD noted in triage.

## 2017-10-11 NOTE — Discharge Instructions (Addendum)
You have an enlarged lymph node under your chin causing pain and swelling.  Sometimes this can be an indication of an infection as the lymph nodes swell and become tender when they are fighting an infection, however there is no signs of any head or neck infections on your exam or with your CT scanning test completed today.  Lymph nodes can also become enlarged and tender simply from being inflamed.  You are being treated with antibiotics to cover you for possible infection in this lymph node, but also with pain medicine to help as this is resolving.  I also suggested a heating pad applied to the site 15 minutes 3 times daily.  Follow-up with your PCP if your symptoms persist or not improving with today's treatment.  Your blood sugar was extremely elevated today.  Make sure you are watching your diet and checking your blood sugars at least twice daily for the next several days to ensure that this number is getting lower.  Follow-up with your primary doctor per above.

## 2017-10-11 NOTE — ED Notes (Signed)
CRITICAL VALUE ALERT  Critical Value:  Glucose 533   Date & Time Notied:  10/11/17 1221  Provider Notified: J idol  Orders Received/Actions taken: none

## 2017-10-11 NOTE — ED Notes (Signed)
Pt transported to CT ?

## 2017-10-15 ENCOUNTER — Ambulatory Visit: Payer: Self-pay | Admitting: Urology

## 2017-12-02 ENCOUNTER — Ambulatory Visit: Payer: Medicaid Other | Admitting: Obstetrics and Gynecology

## 2017-12-02 ENCOUNTER — Encounter: Payer: Self-pay | Admitting: Obstetrics and Gynecology

## 2017-12-02 VITALS — BP 137/78 | HR 78 | Ht 59.0 in | Wt 149.4 lb

## 2017-12-02 DIAGNOSIS — N760 Acute vaginitis: Secondary | ICD-10-CM

## 2017-12-02 DIAGNOSIS — B9689 Other specified bacterial agents as the cause of diseases classified elsewhere: Secondary | ICD-10-CM | POA: Diagnosis not present

## 2017-12-02 MED ORDER — METRONIDAZOLE 0.75 % VA GEL: 1.0000 | Freq: Every day | VAGINAL | 3 refills | Status: DC

## 2017-12-02 MED ORDER — KETOCONAZOLE 2 % EX CREA: 1.0000 "application " | TOPICAL_CREAM | Freq: Every day | CUTANEOUS | 1 refills | Status: DC

## 2017-12-02 MED ORDER — NYSTATIN-TRIAMCINOLONE 100000-0.1 UNIT/GM-% EX OINT: 1.0000 "application " | TOPICAL_OINTMENT | Freq: Two times a day (BID) | CUTANEOUS | 99 refills | Status: DC

## 2017-12-02 NOTE — Patient Instructions (Signed)
Metronidazole vaginal gel What is this medicine? METRONIDAZOLE (me troe NI da zole) VAGINAL GEL is an antiinfective. It is used to treat bacterial vaginitis. This medicine may be used for other purposes; ask your health care provider or pharmacist if you have questions. COMMON BRAND NAME(S): MetroGel, MetroGel Vaginal, MetroGel-Vaginal, NUVESSA, Vandazole What should I tell my health care provider before I take this medicine? They need to know if you have any of these conditions: -if you drink alcohol containing drinks -if you have taken disulfiram in the past two weeks -liver disease -peripheral neuropathy -seizures -an unusual or allergic reaction to metronidazole, parabens, nitroimidazoles, or other medicines, foods, dyes, or preservatives -pregnant or trying to get pregnant -breast-feeding How should I use this medicine? This medicine is only for use in the vagina. Do not take by mouth or apply to other areas of the body. Follow the directions on the prescription label. Wash hands before and after use. Screw the applicator to the tube and squeeze the tube gently to fill the applicator. Lie on your back, part and bend your knees. Insert the applicator tip high in the vagina and push the plunger to release the gel into the vagina. Gently remove the applicator. Wash the applicator well with warm water and soap. Use at regular intervals. Finish the full course prescribed by your doctor or health care professional even if you think your condition is better. Do not stop using except on the advice of your doctor or health care professional. Talk to your pediatrician regarding the use of this medicine in children. Special care may be needed. Overdosage: If you think you have taken too much of this medicine contact a poison control center or emergency room at once. NOTE: This medicine is only for you. Do not share this medicine with others. What if I miss a dose? If you miss a dose, use it as soon as  you can. If it is almost time for your next dose, use only that dose. Do not use double or extra doses. What may interact with this medicine? Do not take this medicine with any of the following medications: -alcohol or any product that contains alcohol -cisapride -dofetilide -dronedarone -pimozide -thioridazine -ziprasidone This medicine may also interact with the following medications: -cimetidine -lithium -other medicines that prolong the QT interval (cause an abnormal heart rhythm) -warfarin This list may not describe all possible interactions. Give your health care provider a list of all the medicines, herbs, non-prescription drugs, or dietary supplements you use. Also tell them if you smoke, drink alcohol, or use illegal drugs. Some items may interact with your medicine. What should I watch for while using this medicine? Tell your doctor or health care professional if your symptoms do not start to get better in 2 or 3 days. Avoid alcoholic drinks while you are taking this medicine and for three days afterwards. Alcohol may make you feel dizzy, sick, or flushed. You may get drowsy or dizzy. Do not drive, use machinery, or do anything that needs mental alertness until you know how this medicine affects you. To reduce the risk of dizzy or fainting spells, do not sit or stand up quickly, especially if you are an older patient. Your clothing may get soiled if you have a vaginal discharge. You can wear a sanitary napkin. Do not use tampons. Wear freshly washed cotton, not synthetic, panties. Do not have sex until you have finished your treatment. Having sex can make the treatment less effective. Your sexual partner  may also need treatment. What side effects may I notice from receiving this medicine? Side effects that you should report to your doctor or health care professional as soon as possible: -dizziness -frequent passing of urine -headache -loss of appetite -nausea -skin rash,  itching -stomach pain or cramps -vaginal irritation or discharge -vulvar burning or swelling Side effects that usually do not require medical attention (report to your doctor or health care professional if they continue or are bothersome): -dark urine -mild vaginal burning This list may not describe all possible side effects. Call your doctor for medical advice about side effects. You may report side effects to FDA at 1-800-FDA-1088. Where should I keep my medicine? Keep out of the reach of children. Store at room temperature between 15 and 30 degrees C (59 and 86 degrees F). Do not freeze. Throw away any unused medicine after the expiration date. NOTE: This sheet is a summary. It may not cover all possible information. If you have questions about this medicine, talk to your doctor, pharmacist, or health care provider.  2018 [employer]vier/Gold Standard (2012-08-05 14:09:23)

## 2017-12-02 NOTE — Progress Notes (Signed)
Patient ID: Sandra [name], female   DOB: January 26, 1975, 42 y.o.   MRN: [id number]    [facility] Visit  @DATE @            Patient name: Sandra [name] MRN [id number]  Date of birth: Jan 17, 1975  CC & HPI:  Sandra [name] is a 42 y.o. female presenting today for redness, itching, and bumps around her vagina. She tried Nystatin and Monostat but they did not help. Pt is diabetic and had a hysterectomy in her 28s because of cancer. Pt has hx of bladder cancer. Her blood sugars are often in the 400s.  The patient denies fever, chills or any other symptoms or complaints at this time.   ROS:  ROS + bumps around vagina + redness + itching - fever - chills All systems are negative except as noted in the HPI and PMH.   Pertinent History Reviewed:   Reviewed:  Medical         Past Medical History:  Diagnosis Date  . ADHD (attention deficit hyperactivity disorder)   . Allergic rhinitis   . Anemia    hx of  . Bipolar 1 disorder ([place])   . Bladder tumor   . Cancer ([place])    bladder  . Dizziness   . Full dentures   . GERD (gastroesophageal reflux disease)   . Gross hematuria   . Hyperlipidemia   . Hypertension   . Type 2 diabetes mellitus ([place])   . Urgency of urination    dysuria, sui  . Wears glasses                               Surgical Hx:    Past Surgical History:  Procedure Laterality Date  . CYSTOSCOPY N/A 10/03/2015   Procedure: CYSTOSCOPY;  Surgeon: Irine Seal, MD;  Location: [facility];  Service: Urology;  Laterality: N/A;  . CYSTOSCOPY WITH STENT PLACEMENT Right 10/31/2015   Procedure: CYSTOSCOPY WITH ATTEMPTED RIGHT URETERAL OPENING;  Surgeon: Irine Seal, MD;  Location: [facility];  Service: Urology;  Laterality: Right;  . MULTIPLE TOOTH EXTRACTIONS  2015  . OVARIAN CYST REMOVAL Right 2004 approx  . TONSILLECTOMY  12/30/2004  . TOTAL ABDOMINAL HYSTERECTOMY W/ BILATERAL SALPINGOOPHORECTOMY  05/16/2004  . TRANSURETHRAL RESECTION OF  BLADDER TUMOR N/A 10/03/2015   Procedure: TRANSURETHRAL RESECTION OF BLADDER TUMOR (TURBT);  Surgeon: Irine Seal, MD;  Location: [facility];  Service: Urology;  Laterality: N/A;  . TRANSURETHRAL RESECTION OF BLADDER TUMOR N/A 10/31/2015   Procedure: RE-STAGINGG TRANSURETHRAL RESECTION OF BLADDER TUMOR (TURBT);  Surgeon: Irine Seal, MD;  Location: [facility];  Service: Urology;  Laterality: N/A;  . TRANSURETHRAL RESECTION OF BLADDER TUMOR N/A 08/17/2016   Procedure: TRANSURETHRAL RESECTION OF BLADDER TUMOR (TURBT);  Surgeon: Cleon Gustin, MD;  Location: [facility];  Service: Urology;  Laterality: N/A;   Medications: Reviewed & Updated - see associated section                       Current Outpatient Medications:  .  amphetamine-dextroamphetamine (ADDERALL) 30 MG tablet, Take 30 mg by mouth daily. , Disp: , Rfl: 0 .  aspirin-acetaminophen-caffeine (EXCEDRIN MIGRAINE) 250-250-65 MG tablet, Take 2 tablets by mouth 3 (three) times daily as needed for headache., Disp: , Rfl:  .  cetirizine (ZYRTEC) 10 MG tablet, Take 10 mg by mouth daily. ,  Disp: , Rfl: 2 .  cloNIDine (CATAPRES) 0.1 MG tablet, Take 0.1 mg by mouth 2 (two) times daily., Disp: , Rfl: 1 .  EQUETRO 300 MG CP12, Take 1 capsule (300 mg total) by mouth 2 (two) times daily., Disp: 60 each, Rfl: 0 .  FLUoxetine (PROZAC) 40 MG capsule, Take 1 capsule (40 mg total) by mouth daily., Disp: 30 capsule, Rfl: 0 .  fluticasone (FLONASE) 50 MCG/ACT nasal spray, fluticasone propionate 50 mcg/actuation nasal spray,suspension, Disp: , Rfl:  .  gabapentin (NEURONTIN) 300 MG capsule, Take 600 mg by mouth 3 (three) times daily., Disp: , Rfl: 0 .  LORazepam (ATIVAN) 1 MG tablet, Take 1 tablet (1 mg total) by mouth 3 (three) times daily as needed for anxiety., Disp: 90 tablet, Rfl: 0 .  lovastatin (MEVACOR) 40 MG tablet, Take 40 mg by mouth at bedtime., Disp: , Rfl:  .  metFORMIN (GLUCOPHAGE) 500 MG tablet, Take 1,000 mg by mouth  2 (two) times daily with a meal. , Disp: , Rfl:  .  montelukast (SINGULAIR) 10 MG tablet, Take 10 mg by mouth at bedtime. , Disp: , Rfl: 0 .  pantoprazole (PROTONIX) 40 MG tablet, Take 40 mg by mouth at bedtime. , Disp: , Rfl: 2 .  PROAIR HFA 108 (90 Base) MCG/ACT inhaler, Inhale 1-2 puffs into the lungs every 4 (four) hours as needed for shortness of breath., Disp: , Rfl: 0 .  lisdexamfetamine (VYVANSE) 60 MG capsule, Take 1 capsule (60 mg total) by mouth every morning. (Patient not taking: Reported on 10/11/2017), Disp: 30 capsule, Rfl: 0 .  lisdexamfetamine (VYVANSE) 70 MG capsule, Take 70 mg by mouth daily., Disp: , Rfl:   Current Facility-Administered Medications:  .  betamethasone acetate-betamethasone sodium phosphate (CELESTONE) injection 3 mg, 3 mg, Intramuscular, Once, Amalia Hailey, Dorathy Daft, DPM  Social History: Reviewed -  reports that she has been smoking cigarettes. She has been smoking about 0.50 packs per day. She has never used smokeless tobacco.  Objective Findings:  Vitals: Blood pressure 137/78, pulse 78, height 4\' 11"  (1.499 m), weight 149 lb 6.4 oz (67.8 kg).  PHYSICAL EXAMINATION General appearance - alert, well appearing, and in no distress and oriented to person, place, and time Mental status - alert, oriented to person, place, and time, normal mood, behavior, speech, dress, motor activity, and thought processes, affect appropriate to mood  PELVIC External genitalia - symmetric erythema, satellite lesions. On right inguinal crease, 4 cm x 2 cm area of erythma and scaling. Cervix - surgically absent Uterus - surgically absent [place] - yeast infection Rectal - redness spreads to anus  Assessment & Plan:   A:  1. Tinea Corporis 2. Yeast Infection  P:  1. Rx Mycolog cream 2. Rx Diflucan= cancelled due to concurrent statin and risk of rhabdomyolysis. 3. F/U 3 wks   By signing my name below, I, De Burrs, attest that this documentation has been prepared under  the direction and in the presence of Jonnie Kind, MD. Electronically Signed: De Burrs, Medical Scribe. 12/02/17. 12:51 PM.  I personally performed the services described in this documentation, which was SCRIBED in my presence. The recorded information has been reviewed and considered accurate. It has been edited as necessary during review. Jonnie Kind, MD

## 2017-12-03 ENCOUNTER — Ambulatory Visit: Payer: Medicaid Other | Admitting: Urology

## 2017-12-03 ENCOUNTER — Other Ambulatory Visit (HOSPITAL_COMMUNITY)
Admission: RE | Admit: 2017-12-03 | Discharge: 2017-12-03 | Disposition: A | Discharge status: Home / Self Care | Payer: Medicaid Other | Source: Ambulatory Visit | Attending: Urology | Admitting: Urology

## 2017-12-03 DIAGNOSIS — N3941 Urge incontinence: Secondary | ICD-10-CM | POA: Diagnosis not present

## 2017-12-03 DIAGNOSIS — Z8551 Personal history of malignant neoplasm of bladder: Secondary | ICD-10-CM | POA: Diagnosis present

## 2017-12-17 ENCOUNTER — Ambulatory Visit: Payer: Self-pay | Admitting: Urology

## 2017-12-24 ENCOUNTER — Ambulatory Visit (INDEPENDENT_AMBULATORY_CARE_PROVIDER_SITE_OTHER): Payer: Medicaid Other | Admitting: Urology

## 2017-12-24 DIAGNOSIS — C670 Malignant neoplasm of trigone of bladder: Secondary | ICD-10-CM | POA: Diagnosis not present

## 2017-12-29 ENCOUNTER — Encounter: Payer: Self-pay | Admitting: Obstetrics and Gynecology

## 2017-12-29 ENCOUNTER — Ambulatory Visit: Payer: Medicaid Other | Admitting: Obstetrics and Gynecology

## 2017-12-29 VITALS — BP 140/88 | HR 69 | Ht 59.0 in | Wt 143.6 lb

## 2017-12-29 DIAGNOSIS — N898 Other specified noninflammatory disorders of vagina: Secondary | ICD-10-CM | POA: Diagnosis not present

## 2017-12-29 LAB — POCT WET PREP WITH KOH
KOH PREP POC: NEGATIVE
Trichomonas, UA: NEGATIVE

## 2017-12-29 MED ORDER — NYSTATIN-TRIAMCINOLONE 100000-0.1 UNIT/GM-% EX OINT: 1.0000 "application " | TOPICAL_OINTMENT | Freq: Two times a day (BID) | CUTANEOUS | 99 refills | Status: DC

## 2017-12-29 NOTE — Progress Notes (Signed)
Patient ID: [name], female   DOB: 1975-04-24, 42 y.o.   MRN: [id number]    [facility] Visit  @DATE @            Patient name: Sandra [name] MRN [id number]  Date of birth: May 13, 1975  CC & HPI:  Sandra [name] is a 42 y.o. female presenting today for vaginal pain. Says she still has itching in vaginal area but is not as bad as before. She doesn't manage her diabetes the last few days her numbers were 300-400. She sees Dr    Merleen Nicely in [place]. Is open to seeing diabetic specialist. She's not working but says she walk but not far nor enough to lose any weight. Says she should know better because daughter is type 1 diabetic, and manages the daughter diabetes.  ROS:  ROS +vaginal pain +yeast +fever -Chills All systems are negative except as noted in the HPI and PMH.   Pertinent History Reviewed:   Reviewed: Medical         Past Medical History:  Diagnosis Date  . ADHD (attention deficit hyperactivity disorder)   . Allergic rhinitis   . Anemia    hx of  . Bipolar 1 disorder ([place])   . Bladder tumor   . Cancer ([place])    bladder  . Dizziness   . Full dentures   . GERD (gastroesophageal reflux disease)   . Gross hematuria   . Hyperlipidemia   . Hypertension   . Type 2 diabetes mellitus ([place])   . Urgency of urination    dysuria, sui  . Wears glasses                               Surgical Hx:    Past Surgical History:  Procedure Laterality Date  . CYSTOSCOPY N/A 10/03/2015   Procedure: CYSTOSCOPY;  Surgeon: Irine Seal, MD;  Location[facility];  Service: Urology;  Laterality: N/A;  . CYSTOSCOPY WITH STENT PLACEMENT Right 10/31/2015   Procedure: CYSTOSCOPY WITH ATTEMPTED RIGHT URETERAL OPENING;  Surgeon: Irine Seal, MD;  Location: [facility];  Service: Urology;  Laterality: Right;  . MULTIPLE TOOTH EXTRACTIONS  2015  . OVARIAN CYST REMOVAL Right 2004 approx  . TONSILLECTOMY  12/30/2004  . TOTAL ABDOMINAL HYSTERECTOMY W/  BILATERAL SALPINGOOPHORECTOMY  05/16/2004  . TRANSURETHRAL RESECTION OF BLADDER TUMOR N/A 10/03/2015   Procedure: TRANSURETHRAL RESECTION OF BLADDER TUMOR (TURBT);  Surgeon: Irine Seal, MD;  Location[facility];  Service: Urology;  Laterality: N/A;  . TRANSURETHRAL RESECTION OF BLADDER TUMOR N/A 10/31/2015   Procedure: RE-STAGINGG TRANSURETHRAL RESECTION OF BLADDER TUMOR (TURBT);  Surgeon: Irine Seal, MD;  Location: [facility];  Service: Urology;  Laterality: N/A;  . TRANSURETHRAL RESECTION OF BLADDER TUMOR N/A 08/17/2016   Procedure: TRANSURETHRAL RESECTION OF BLADDER TUMOR (TURBT);  Surgeon: Cleon Gustin, MD;  Location: [facility];  Service: Urology;  Laterality: N/A;   Medications: Reviewed & Updated - see associated section                       Current Outpatient Medications:  .  amphetamine-dextroamphetamine (ADDERALL) 30 MG tablet, Take 30 mg by mouth daily. , Disp: , Rfl: 0 .  aspirin-acetaminophen-caffeine (EXCEDRIN MIGRAINE) 250-250-65 MG tablet, Take 2 tablets by mouth 3 (three) times daily as needed for headache., Disp: , Rfl:  .  cetirizine (ZYRTEC) 10  MG tablet, Take 10 mg by mouth daily. , Disp: , Rfl: 2 .  cloNIDine (CATAPRES) 0.1 MG tablet, Take 0.1 mg by mouth 2 (two) times daily., Disp: , Rfl: 1 .  EQUETRO 300 MG CP12, Take 1 capsule (300 mg total) by mouth 2 (two) times daily., Disp: 60 each, Rfl: 0 .  FLUoxetine (PROZAC) 40 MG capsule, Take 1 capsule (40 mg total) by mouth daily., Disp: 30 capsule, Rfl: 0 .  fluticasone (FLONASE) 50 MCG/ACT nasal spray, fluticasone propionate 50 mcg/actuation nasal spray,suspension, Disp: , Rfl:  .  gabapentin (NEURONTIN) 300 MG capsule, Take 600 mg by mouth 3 (three) times daily., Disp: , Rfl: 0 .  ketoconazole (NIZORAL) 2 % cream, Apply 1 application topically daily. Apply to irritated skin when dry.5x/wk begin after completion of mycolog., Disp: 60 g, Rfl: 1 .  lisdexamfetamine (VYVANSE) 70 MG capsule, Take  70 mg by mouth daily., Disp: , Rfl:  .  LORazepam (ATIVAN) 1 MG tablet, Take 1 tablet (1 mg total) by mouth 3 (three) times daily as needed for anxiety., Disp: 90 tablet, Rfl: 0 .  lovastatin (MEVACOR) 40 MG tablet, Take 40 mg by mouth at bedtime., Disp: , Rfl:  .  metFORMIN (GLUCOPHAGE) 500 MG tablet, Take 1,000 mg by mouth 2 (two) times daily with a meal. , Disp: , Rfl:  .  montelukast (SINGULAIR) 10 MG tablet, Take 10 mg by mouth at bedtime. , Disp: , Rfl: 0 .  nystatin-triamcinolone ointment (MYCOLOG), Apply 1 application topically 2 (two) times daily. To affected area., Disp: 30 g, Rfl: prn .  pantoprazole (PROTONIX) 40 MG tablet, Take 40 mg by mouth at bedtime. , Disp: , Rfl: 2 .  PROAIR HFA 108 (90 Base) MCG/ACT inhaler, Inhale 1-2 puffs into the lungs every 4 (four) hours as needed for shortness of breath., Disp: , Rfl: 0  Current Facility-Administered Medications:  .  betamethasone acetate-betamethasone sodium phosphate (CELESTONE) injection 3 mg, 3 mg, Intramuscular, Once, Edrick Kins, DPM   Social History: Reviewed -  reports that she has been smoking cigarettes. She has been smoking about 0.50 packs per day. She has never used smokeless tobacco.  Objective Findings:  Vitals: Blood pressure 140/88, pulse 69, height 4\' 11"  (1.499 m), weight 143 lb 9.6 oz (65.1 kg).  PHYSICAL EXAMINATION General appearance - alert, well appearing, and in no distress and oriented to person, place, and time Mental status - alert, oriented to person, place, and time, normal mood, behavior, speech, dress, motor activity, and thought processes, affect appropriate to mood  PELVIC Vulva -  small follicultis Vagina - Cervix - surgically absent Uterus - surgically absent KOH - neg trich, clue, occasional yeast, lactobacilli, tissue fibers    Discussed with pt risks  of uncontrolled diabetes. At end of discussion, pt had opportunity to ask questions and says is willing to see diabetic specialist to  get diabetes under control and work on discipline. Greater than 50% was spent in counseling and coordination of care with the patient. Total time greater than: 15 minutes   Assessment & Plan:   A:  1. Rx Yeast infection 2. Diabetes  P:  1. Rx Mycolog  2. Is open to Referral to Dr. Dorris Fetch 3. Schedule aptt with diabetic educator 4. Gyn: fu_PRN  By signing my name below, I, Samul Dada, attest that this documentation has been prepared under the direction and in the presence of Jonnie Kind, MD. Electronically Signed: [place]. 12/29/17. 11:27 AM.  I personally performed the services described in this documentation, which was SCRIBED in my presence. The recorded information has been reviewed and considered accurate. It has been edited as necessary during review. Jonnie Kind, MD

## 2017-12-31 ENCOUNTER — Ambulatory Visit (INDEPENDENT_AMBULATORY_CARE_PROVIDER_SITE_OTHER): Payer: Medicaid Other | Admitting: Urology

## 2017-12-31 DIAGNOSIS — C670 Malignant neoplasm of trigone of bladder: Secondary | ICD-10-CM | POA: Diagnosis not present

## 2018-01-14 ENCOUNTER — Ambulatory Visit (INDEPENDENT_AMBULATORY_CARE_PROVIDER_SITE_OTHER): Payer: Medicaid Other | Admitting: Urology

## 2018-01-14 DIAGNOSIS — Z8551 Personal history of malignant neoplasm of bladder: Secondary | ICD-10-CM | POA: Diagnosis not present

## 2018-02-02 ENCOUNTER — Ambulatory Visit: Payer: Medicaid Other | Admitting: Urology

## 2018-02-02 DIAGNOSIS — N3281 Overactive bladder: Secondary | ICD-10-CM | POA: Diagnosis not present

## 2018-02-04 ENCOUNTER — Other Ambulatory Visit: Payer: Self-pay | Admitting: Urology

## 2018-02-24 ENCOUNTER — Emergency Department (HOSPITAL_COMMUNITY)
Admission: EM | Admit: 2018-02-24 | Discharge: 2018-02-24 | Disposition: A | Discharge status: Home / Self Care | Payer: Medicaid Other | Attending: Emergency Medicine | Admitting: Emergency Medicine

## 2018-02-24 ENCOUNTER — Encounter (HOSPITAL_COMMUNITY): Payer: Self-pay

## 2018-02-24 ENCOUNTER — Emergency Department (HOSPITAL_COMMUNITY): Payer: Medicaid Other

## 2018-02-24 ENCOUNTER — Other Ambulatory Visit: Payer: Self-pay

## 2018-02-24 DIAGNOSIS — K310 Acute dilatation of stomach: Secondary | ICD-10-CM | POA: Insufficient documentation

## 2018-02-24 DIAGNOSIS — Z7984 Long term (current) use of oral hypoglycemic drugs: Secondary | ICD-10-CM | POA: Diagnosis not present

## 2018-02-24 DIAGNOSIS — I10 Essential (primary) hypertension: Secondary | ICD-10-CM | POA: Diagnosis not present

## 2018-02-24 DIAGNOSIS — E1165 Type 2 diabetes mellitus with hyperglycemia: Secondary | ICD-10-CM | POA: Diagnosis not present

## 2018-02-24 DIAGNOSIS — Z79899 Other long term (current) drug therapy: Secondary | ICD-10-CM | POA: Insufficient documentation

## 2018-02-24 DIAGNOSIS — R079 Chest pain, unspecified: Secondary | ICD-10-CM | POA: Diagnosis present

## 2018-02-24 DIAGNOSIS — F17210 Nicotine dependence, cigarettes, uncomplicated: Secondary | ICD-10-CM | POA: Diagnosis not present

## 2018-02-24 DIAGNOSIS — R739 Hyperglycemia, unspecified: Secondary | ICD-10-CM

## 2018-02-24 LAB — CBC WITH DIFFERENTIAL/PLATELET
Abs Immature Granulocytes: 0.03 10*3/uL (ref 0.00–0.07)
BASOS ABS: 0 10*3/uL (ref 0.0–0.1)
Basophils Relative: 0 %
EOS ABS: 0.1 10*3/uL (ref 0.0–0.5)
EOS PCT: 1 %
HCT: 46.7 % — ABNORMAL HIGH (ref 36.0–46.0)
HEMOGLOBIN: 14.6 g/dL (ref 12.0–15.0)
Immature Granulocytes: 0 %
Lymphocytes Relative: 40 %
Lymphs Abs: 3.2 10*3/uL (ref 0.7–4.0)
MCH: 26 pg (ref 26.0–34.0)
MCHC: 31.3 g/dL (ref 30.0–36.0)
MCV: 83.2 fL (ref 80.0–100.0)
MONO ABS: 0.5 10*3/uL (ref 0.1–1.0)
Monocytes Relative: 7 %
NRBC: 0 % (ref 0.0–0.2)
Neutro Abs: 4.2 10*3/uL (ref 1.7–7.7)
Neutrophils Relative %: 52 %
Platelets: 319 10*3/uL (ref 150–400)
RBC: 5.61 MIL/uL — AB (ref 3.87–5.11)
RDW: 13 % (ref 11.5–15.5)
WBC: 8.2 10*3/uL (ref 4.0–10.5)

## 2018-02-24 LAB — URINALYSIS, ROUTINE W REFLEX MICROSCOPIC
BACTERIA UA: NONE SEEN
Bilirubin Urine: NEGATIVE
Glucose, UA: >=500 mg/dL — AB
HGB URINE DIPSTICK: NEGATIVE
Ketones, ur: NEGATIVE mg/dL
Nitrite: NEGATIVE
PROTEIN: NEGATIVE mg/dL
Specific Gravity, Urine: 1.028 (ref 1.005–1.030)
pH: 7 (ref 5.0–8.0)

## 2018-02-24 LAB — COMPREHENSIVE METABOLIC PANEL
ALT: 16 U/L (ref 0–44)
AST: 11 U/L — ABNORMAL LOW (ref 15–41)
Albumin: 3.4 g/dL — ABNORMAL LOW (ref 3.5–5.0)
Alkaline Phosphatase: 127 U/L — ABNORMAL HIGH (ref 38–126)
Anion gap: 7 (ref 5–15)
BILIRUBIN TOTAL: 0.4 mg/dL (ref 0.3–1.2)
BUN: 9 mg/dL (ref 6–20)
CALCIUM: 8.6 mg/dL — AB (ref 8.9–10.3)
CO2: 24 mmol/L (ref 22–32)
CREATININE: 0.49 mg/dL (ref 0.44–1.00)
Chloride: 101 mmol/L (ref 98–111)
GFR calc Af Amer: >60 mL/min (ref >60)
GFR calc non Af Amer: >60 mL/min (ref >60)
GLUCOSE: 316 mg/dL — AB (ref 70–99)
Potassium: 4 mmol/L (ref 3.5–5.1)
SODIUM: 132 mmol/L — AB (ref 135–145)
Total Protein: 6.8 g/dL (ref 6.5–8.1)

## 2018-02-24 LAB — CBG MONITORING, ED: GLUCOSE-CAPILLARY: 324 mg/dL — AB (ref 70–99)

## 2018-02-24 LAB — TROPONIN I: Troponin I: <0.03 ng/mL (ref <0.03)

## 2018-02-24 LAB — BASIC METABOLIC PANEL
BUN: 9 (ref 4–21)
Creatinine: 0.5 (ref 0.5–1.1)

## 2018-02-24 LAB — D-DIMER, QUANTITATIVE: D-Dimer, Quant: <0.27 ug/mL-FEU (ref 0.00–0.50)

## 2018-02-24 LAB — HEMOGLOBIN A1C: Hemoglobin A1C: 15

## 2018-02-24 MED ORDER — IOHEXOL 300 MG/ML  SOLN
100.0000 mL | Freq: Once | INTRAMUSCULAR | Status: AC | PRN
  Administered 2018-02-24: 100 mL via INTRAVENOUS

## 2018-02-24 MED ORDER — ONDANSETRON HCL 4 MG/2ML IJ SOLN
4.0000 mg | Freq: Once | INTRAMUSCULAR | Status: AC
  Administered 2018-02-24: 4 mg via INTRAVENOUS

## 2018-02-24 MED ORDER — HYDROCODONE-ACETAMINOPHEN 5-325 MG PO TABS: 1.0000 | ORAL_TABLET | Freq: Four times a day (QID) | ORAL | 0 refills | Status: DC | PRN

## 2018-02-24 MED ORDER — ONDANSETRON HCL 4 MG/2ML IJ SOLN
4.0000 mg | Freq: Once | INTRAMUSCULAR | Status: AC
  Administered 2018-02-24: 4 mg via INTRAVENOUS
  Filled 2018-02-24: qty 2

## 2018-02-24 MED ORDER — HYDROMORPHONE HCL 1 MG/ML IJ SOLN
1.0000 mg | Freq: Once | INTRAMUSCULAR | Status: AC
  Administered 2018-02-24: 1 mg via INTRAVENOUS
  Filled 2018-02-24: qty 1

## 2018-02-24 MED ORDER — ONDANSETRON HCL 4 MG/2ML IJ SOLN
INTRAMUSCULAR | Status: AC
  Administered 2018-02-24: 4 mg via INTRAVENOUS
  Filled 2018-02-24: qty 2

## 2018-02-24 MED ORDER — SODIUM CHLORIDE 0.9 % IV BOLUS
1000.0000 mL | Freq: Once | INTRAVENOUS | Status: AC
  Administered 2018-02-24: 1000 mL via INTRAVENOUS

## 2018-02-24 NOTE — ED Provider Notes (Signed)
[facility] EMERGENCY DEPARTMENT Provider Note   CSN: [id number] Arrival date & time: 02/24/18  1858     History   Chief Complaint No chief complaint on file.   HPI Sandra [name] is a 43 y.o. female.  Patient complains of left-sided chest pain.  Also she has been having high sugars  The history is provided by the patient. No language interpreter was used.  Chest Pain  Pain location:  L chest Pain quality: aching   Pain radiates to:  Does not radiate Pain severity:  Mild Onset quality:  Gradual Timing:  Constant Chronicity:  New Context: breathing   Relieved by:  Nothing Worsened by:  Nothing Ineffective treatments:  None tried Associated symptoms: no abdominal pain, no back pain, no cough, no dysphagia, no fatigue and no headache     Past Medical History:  Diagnosis Date  . ADHD (attention deficit hyperactivity disorder)   . Allergic rhinitis   . Anemia    hx of  . Bipolar 1 disorder ([place])   . Bladder tumor   . Cancer ([place])    bladder  . Dizziness   . Full dentures   . GERD (gastroesophageal reflux disease)   . Gross hematuria   . Hyperlipidemia   . Hypertension   . Type 2 diabetes mellitus ([place])   . Urgency of urination    dysuria, sui  . Wears glasses     Patient Active Problem List   Diagnosis Date Noted  . GAD (generalized anxiety disorder) 12/17/2016  . MDD (major depressive disorder), recurrent episode, moderate ([place]) 12/17/2016  . Attention deficit hyperactivity disorder (ADHD) 12/17/2016  . Postoperative anemia due to acute blood loss 11/04/2015  . Bladder cancer ([place]) 10/31/2015  . CARPAL TUNNEL SYNDROME 10/02/2009    Past Surgical History:  Procedure Laterality Date  . CYSTOSCOPY N/A 10/03/2015   Procedure: CYSTOSCOPY;  Surgeon: Irine Seal, MD;  Location: [facility];  Service: Urology;  Laterality: N/A;  . CYSTOSCOPY WITH STENT PLACEMENT Right 10/31/2015   Procedure: CYSTOSCOPY WITH ATTEMPTED RIGHT URETERAL OPENING;   Surgeon: Irine Seal, MD;  Location: [facility];  Service: Urology;  Laterality: Right;  . MULTIPLE TOOTH EXTRACTIONS  2015  . OVARIAN CYST REMOVAL Right 2004 approx  . TONSILLECTOMY  12/30/2004  . TOTAL ABDOMINAL HYSTERECTOMY W/ BILATERAL SALPINGOOPHORECTOMY  05/16/2004  . TRANSURETHRAL RESECTION OF BLADDER TUMOR N/A 10/03/2015   Procedure: TRANSURETHRAL RESECTION OF BLADDER TUMOR (TURBT);  Surgeon: Irine Seal, MD;  Location: [facility];  Service: Urology;  Laterality: N/A;  . TRANSURETHRAL RESECTION OF BLADDER TUMOR N/A 10/31/2015   Procedure: RE-STAGINGG TRANSURETHRAL RESECTION OF BLADDER TUMOR (TURBT);  Surgeon: Irine Seal, MD;  Location: [facility];  Service: Urology;  Laterality: N/A;  . TRANSURETHRAL RESECTION OF BLADDER TUMOR N/A 08/17/2016   Procedure: TRANSURETHRAL RESECTION OF BLADDER TUMOR (TURBT);  Surgeon: Cleon Gustin, MD;  Location: [facility];  Service: Urology;  Laterality: N/A;     OB History    Gravida  2   Para      Term      Preterm      AB      Living  2     SAB      TAB      Ectopic      Multiple      Live Births               Home Medications    Prior to  Admission medications   Medication Sig Start Date End Date Taking? Authorizing Provider  ALPRAZolam Duanne Moron) 1 MG tablet Take 1 tablet by mouth 3 (three) times daily. 01/14/18  Yes [provider]  amphetamine-dextroamphetamine (ADDERALL) 30 MG tablet Take 30 mg by mouth daily.  09/21/17  Yes [provider]  aspirin-acetaminophen-caffeine (EXCEDRIN MIGRAINE) [phone] MG tablet Take 2 tablets by mouth 3 (three) times daily as needed for headache.   Yes [provider]  cetirizine (ZYRTEC) 10 MG tablet Take 10 mg by mouth daily.  10/09/14  Yes [provider]  cloNIDine (CATAPRES) 0.1 MG tablet Take 0.1 mg by mouth 2 (two) times daily. 07/24/16  Yes [provider]  EQUETRO 300 MG CP12 Take 1 capsule (300  mg total) by mouth 2 (two) times daily. 12/17/16  Yes Norman Clay, MD  FLUoxetine (PROZAC) 40 MG capsule Take 1 capsule (40 mg total) by mouth daily. 12/17/16  Yes Hisada, Elie Goody, MD  fluticasone (FLONASE) 50 MCG/ACT nasal spray fluticasone propionate 50 mcg/actuation nasal spray,suspension   Yes [provider]  gabapentin (NEURONTIN) 300 MG capsule Take 600 mg by mouth 3 (three) times daily. 07/05/16  Yes [provider]  ketoconazole (NIZORAL) 2 % cream Apply 1 application topically daily. Apply to irritated skin when dry.5x/wk begin after completion of mycolog. 12/02/17  Yes Jonnie Kind, MD  lisdexamfetamine (VYVANSE) 70 MG capsule Take 70 mg by mouth daily.   Yes [provider]  LORazepam (ATIVAN) 1 MG tablet Take 1 tablet (1 mg total) by mouth 3 (three) times daily as needed for anxiety. 12/17/16  Yes Hisada, Elie Goody, MD  lovastatin (MEVACOR) 40 MG tablet Take 40 mg by mouth at bedtime.   Yes [provider]  metFORMIN (GLUCOPHAGE) 500 MG tablet Take 1,000 mg by mouth 2 (two) times daily with a meal.    Yes [provider]  montelukast (SINGULAIR) 10 MG tablet Take 10 mg by mouth at bedtime.  08/16/17  Yes [provider]  oxybutynin (DITROPAN) 5 MG tablet Take 1 tablet by mouth 3 (three) times daily. 02/02/18  Yes [provider]  pantoprazole (PROTONIX) 40 MG tablet Take 40 mg by mouth at bedtime.  10/09/14  Yes [provider]  HYDROcodone-acetaminophen (NORCO/VICODIN) 5-325 MG tablet Take 1 tablet by mouth every 6 (six) hours as needed for moderate pain. 02/24/18   Milton Ferguson, MD  PROAIR HFA 108 [phone] Base) MCG/ACT inhaler Inhale 1-2 puffs into the lungs every 4 (four) hours as needed for shortness of breath. 10/20/15   [provider]    Family History Family History  Problem Relation Age of Onset  . Hypertension Mother   . Cancer Mother   . Breast cancer Maternal Aunt   . Lung cancer Maternal Uncle   .  Cancer Other   . Diabetes Daughter     Social History Social History   Tobacco Use  . Smoking status: Current Every Day Smoker    Packs/day: 0.50    Types: Cigarettes  . Smokeless tobacco: Never Used  Substance Use Topics  . Alcohol use: No  . Drug use: No     Allergies   Benazepril; Ibuprofen; Metronidazole; Oxycodone; Adhesive [tape]; Codeine; Loratadine; Nystatin-triamcinolone; and Penicillins   Review of Systems Review of Systems  Constitutional: Negative for appetite change and fatigue.  HENT: Negative for congestion, ear discharge, sinus pressure and trouble swallowing.   Eyes: Negative for discharge.  Respiratory: Negative for cough.   Cardiovascular: Positive for chest  pain.  Gastrointestinal: Negative for abdominal pain and diarrhea.  Genitourinary: Negative for frequency and hematuria.  Musculoskeletal: Negative for back pain.  Skin: Negative for rash.  Neurological: Negative for seizures and headaches.  Psychiatric/Behavioral: Negative for hallucinations.     Physical Exam Updated Vital Signs BP 129/83   Pulse 74   Temp 98.6 F (37 C) (Oral)   Resp 16   Ht 5' (1.524 m)   Wt 65.3 kg   SpO2 95%   BMI 28.12 kg/m   Physical Exam Vitals signs and nursing note reviewed.  Constitutional:      Appearance: She is well-developed.  HENT:     Head: Normocephalic.     Nose: Nose normal.  Eyes:     General: No scleral icterus.    Conjunctiva/sclera: Conjunctivae normal.  Neck:     Musculoskeletal: Neck supple.     Thyroid: No thyromegaly.  Cardiovascular:     Rate and Rhythm: Normal rate and regular rhythm.     Heart sounds: No murmur. No friction rub. No gallop.   Pulmonary:     Breath sounds: No stridor. No wheezing or rales.  Chest:     Chest wall: No tenderness.  Abdominal:     General: There is no distension.     Tenderness: There is no abdominal tenderness. There is no rebound.  Musculoskeletal: Normal range of motion.  Lymphadenopathy:      Cervical: No cervical adenopathy.  Skin:    Findings: No erythema or rash.  Neurological:     Mental Status: She is oriented to person, place, and time.     Motor: No abnormal muscle tone.     Coordination: Coordination normal.  Psychiatric:        Behavior: Behavior normal.      ED Treatments / Results  Labs (all labs ordered are listed, but only abnormal results are displayed) Labs Reviewed  CBC WITH DIFFERENTIAL/PLATELET - Abnormal; Notable for the following components:      Result Value   RBC 5.61 (*)    HCT 46.7 (*)    All other components within normal limits  COMPREHENSIVE METABOLIC PANEL - Abnormal; Notable for the following components:   Sodium 132 (*)    Glucose, Bld 316 (*)    Calcium 8.6 (*)    Albumin 3.4 (*)    AST 11 (*)    Alkaline Phosphatase 127 (*)    All other components within normal limits  URINALYSIS, ROUTINE W REFLEX MICROSCOPIC - Abnormal; Notable for the following components:   APPearance HAZY (*)    Glucose, UA >=500 (*)    Leukocytes,Ua TRACE (*)    All other components within normal limits  CBG MONITORING, ED - Abnormal; Notable for the following components:   Glucose-Capillary 324 (*)    All other components within normal limits  D-DIMER, QUANTITATIVE (NOT AT [facility])  TROPONIN I    EKG None  Radiology Dg Chest 2 View  Result Date: 02/24/2018 CLINICAL DATA:  43 y/o  F; 3 days of left flank pain. EXAM: CHEST - 2 VIEW COMPARISON:  03/09/2016 chest radiograph FINDINGS: Stable heart size and mediastinal contours are within normal limits given projection and technique. Both lungs are clear. The visualized skeletal structures are unremarkable. IMPRESSION: No acute pulmonary process identified. Electronically Signed   By: Kristine Garbe M.D.   On: 02/24/2018 20:45   Ct Abdomen Pelvis W Contrast  Result Date: 02/24/2018 CLINICAL DATA:  Abdominal distention EXAM: CT ABDOMEN AND PELVIS WITH  CONTRAST TECHNIQUE: Multidetector CT imaging  of the abdomen and pelvis was performed using the standard protocol following bolus administration of intravenous contrast. CONTRAST:  19mL OMNIPAQUE IOHEXOL 300 MG/ML  SOLN COMPARISON:  10/07/2017 FINDINGS: Lower chest: Lung bases are clear. No effusions. Heart is normal size. Hepatobiliary: No focal hepatic abnormality. Gallbladder unremarkable. Pancreas: No focal abnormality or ductal dilatation. Spleen: No focal abnormality.  Normal size. Adrenals/Urinary Tract: No adrenal abnormality. No focal renal abnormality. No stones or hydronephrosis. Urinary bladder is unremarkable. Stomach/Bowel: Appendix is visualized and is normal. Stomach, large and small bowel grossly unremarkable. Vascular/Lymphatic: Aortic atherosclerosis. No enlarged abdominal or pelvic lymph nodes. Reproductive: Prior hysterectomy.  No adnexal masses. Other: No free fluid or free air. Small umbilical hernia containing fat. Musculoskeletal: No acute bony abnormality. IMPRESSION: No acute findings in the abdomen or pelvis. Small umbilical hernia containing fat. Electronically Signed   By: Rolm Baptise M.D.   On: 02/24/2018 22:45    Procedures Procedures (including critical care time)  Medications Ordered in ED Medications  HYDROmorphone (DILAUDID) injection 1 mg (1 mg Intravenous Given 02/24/18 2007)  ondansetron (ZOFRAN) injection 4 mg (4 mg Intravenous Given 02/24/18 2020)  sodium chloride 0.9 % bolus 1,000 mL (1,000 mLs Intravenous New Bag/Given 02/24/18 2136)  HYDROmorphone (DILAUDID) injection 1 mg (1 mg Intravenous Given 02/24/18 2136)  ondansetron (ZOFRAN) injection 4 mg (4 mg Intravenous Given 02/24/18 2136)  iohexol (OMNIPAQUE) 300 MG/ML solution 100 mL (100 mLs Intravenous Contrast Given 02/24/18 2205)     Initial Impression / Assessment and Plan / ED Course  I have reviewed the triage vital signs and the nursing notes.  Pertinent labs & imaging results that were available during my care of the patient were reviewed by me  and considered in my medical decision making (see chart for details).     Glucose mildly elevated.  Labs otherwise unremarkable.  Patient with some left lower chest discomfort respiration.  Possible pleuritis.  She is given some pain medicine and will follow-up with her PCP  Final Clinical Impressions(s) / ED Diagnoses   Final diagnoses:  Hyperglycemia    ED Discharge Orders         Ordered    HYDROcodone-acetaminophen (NORCO/VICODIN) 5-325 MG tablet  Every 6 hours PRN     02/24/18 2254           Milton Ferguson, MD 02/24/18 2255

## 2018-02-24 NOTE — ED Triage Notes (Signed)
Pt arrives via [facility] from home.  Pt reports pain to left flank x 3 days, states her blood sugars have been running high although she does not have a meter at home.  Pt was at her pmd today and sent her here for further eval of hyperglycemia

## 2018-02-24 NOTE — Discharge Instructions (Addendum)
3 plenty of fluids and follow-up with your primary care doctor next week

## 2018-03-09 ENCOUNTER — Encounter (HOSPITAL_COMMUNITY): Payer: Self-pay

## 2018-03-09 ENCOUNTER — Encounter (HOSPITAL_COMMUNITY)
Admission: RE | Admit: 2018-03-09 | Discharge: 2018-03-09 | Disposition: A | Discharge status: Home / Self Care | Payer: Medicaid Other | Source: Ambulatory Visit | Attending: Urology | Admitting: Urology

## 2018-03-09 NOTE — Patient Instructions (Signed)
Sandra [name]  03/09/2018     @PREFPERIOPPHARMACY @   Your procedure is scheduled on  03/14/2018.  Report to [facility] at  645   A.M.  Call this number if you have problems the morning of surgery:  [phone]   Remember:  Do not eat or drink after midnight.                          Take these medicines the morning of surgery with A SIP OF WATER  Xanax(if needed), adderall, zyrtec, catapress, tegretol, prozac, neurontin, Hydrocodone (if needed), vyvanse, ativan (if needed), singulair, protonix. Use your inhaler before you come.    Do not wear jewelry, make-up or nail polish.  Do not wear lotions, powders, or perfumes, or deodorant.  Do not shave 48 hours prior to surgery.  Men may shave face and neck.  Do not bring valuables to the hospital.  [facility] is not responsible for any belongings or valuables.  Contacts, dentures or bridgework may not be worn into surgery.  Leave your suitcase in the car.  After surgery it may be brought to your room.  For patients admitted to the hospital, discharge time will be determined by your treatment team.  Patients discharged the day of surgery will not be allowed to drive home.   Name and phone number of your driver:   family Special instructions:  DO NOT take any medications for diabetes the morning of your surgery.  Please read over the following fact sheets that you were given. Anesthesia Post-op Instructions and Care and Recovery After Surgery      General Anesthesia, Adult, Care After This sheet gives you information about how to care for yourself after your procedure. Your health care provider may also give you more specific instructions. If you have problems or questions, contact your health care provider. What can I expect after the procedure? After the procedure, the following side effects are common:  Pain or discomfort at the IV site.  Nausea.  Vomiting.  Sore throat.  Trouble concentrating.  Feeling cold or  chills.  Weak or tired.  Sleepiness and fatigue.  Soreness and body aches. These side effects can affect parts of the body that were not involved in surgery. Follow these instructions at home:  For at least 24 hours after the procedure:  Have a responsible adult stay with you. It is important to have someone help care for you until you are awake and alert.  Rest as needed.  Do not: ? Participate in activities in which you could fall or become injured. ? Drive. ? Use heavy machinery. ? Drink alcohol. ? Take sleeping pills or medicines that cause drowsiness. ? Make important decisions or sign legal documents. ? Take care of children on your own. Eating and drinking  Follow any instructions from your health care provider about eating or drinking restrictions.  When you feel hungry, start by eating small amounts of foods that are soft and easy to digest (bland), such as toast. Gradually return to your regular diet.  Drink enough fluid to keep your urine pale yellow.  If you vomit, rehydrate by drinking water, juice, or clear broth. General instructions  If you have sleep apnea, surgery and certain medicines can increase your risk for breathing problems. Follow instructions from your health care provider about wearing your sleep device: ? Anytime you are sleeping, including during daytime naps. ? While taking prescription pain  medicines, sleeping medicines, or medicines that make you drowsy.  Return to your normal activities as told by your health care provider. Ask your health care provider what activities are safe for you.  Take over-the-counter and prescription medicines only as told by your health care provider.  If you smoke, do not smoke without supervision.  Keep all follow-up visits as told by your health care provider. This is important. Contact a health care provider if:  You have nausea or vomiting that does not get better with medicine.  You cannot eat or drink  without vomiting.  You have pain that does not get better with medicine.  You are unable to pass urine.  You develop a skin rash.  You have a fever.  You have redness around your IV site that gets worse. Get help right away if:  You have difficulty breathing.  You have chest pain.  You have blood in your urine or stool, or you vomit blood. Summary  After the procedure, it is common to have a sore throat or nausea. It is also common to feel tired.  Have a responsible adult stay with you for the first 24 hours after general anesthesia. It is important to have someone help care for you until you are awake and alert.  When you feel hungry, start by eating small amounts of foods that are soft and easy to digest (bland), such as toast. Gradually return to your regular diet.  Drink enough fluid to keep your urine pale yellow.  Return to your normal activities as told by your health care provider. Ask your health care provider what activities are safe for you. This information is not intended to replace advice given to you by your health care provider. Make sure you discuss any questions you have with your health care provider. Document Released: 04/06/2000 Document Revised: 08/14/2016 Document Reviewed: 08/14/2016 Elsevier Interactive Patient Education  2019 [employer].

## 2018-03-11 ENCOUNTER — Ambulatory Visit: Payer: Self-pay | Admitting: Urology

## 2018-03-14 ENCOUNTER — Encounter (HOSPITAL_COMMUNITY)
Admission: RE | Disposition: A | Discharge status: Home / Self Care | Payer: Self-pay | Source: Home / Self Care | Attending: Urology

## 2018-03-14 ENCOUNTER — Ambulatory Visit (HOSPITAL_COMMUNITY)
Admission: RE | Admit: 2018-03-14 | Discharge: 2018-03-14 | Disposition: A | Discharge status: Home / Self Care | Payer: Medicaid Other | Attending: Urology | Admitting: Urology

## 2018-03-14 ENCOUNTER — Ambulatory Visit (HOSPITAL_COMMUNITY): Payer: Medicaid Other

## 2018-03-14 ENCOUNTER — Encounter (HOSPITAL_COMMUNITY): Payer: Self-pay | Admitting: Anesthesiology

## 2018-03-14 ENCOUNTER — Ambulatory Visit (HOSPITAL_COMMUNITY): Payer: Medicaid Other | Admitting: Anesthesiology

## 2018-03-14 DIAGNOSIS — Z8551 Personal history of malignant neoplasm of bladder: Secondary | ICD-10-CM | POA: Insufficient documentation

## 2018-03-14 DIAGNOSIS — F319 Bipolar disorder, unspecified: Secondary | ICD-10-CM | POA: Insufficient documentation

## 2018-03-14 DIAGNOSIS — E119 Type 2 diabetes mellitus without complications: Secondary | ICD-10-CM | POA: Diagnosis not present

## 2018-03-14 DIAGNOSIS — Z803 Family history of malignant neoplasm of breast: Secondary | ICD-10-CM | POA: Diagnosis not present

## 2018-03-14 DIAGNOSIS — I10 Essential (primary) hypertension: Secondary | ICD-10-CM | POA: Diagnosis not present

## 2018-03-14 DIAGNOSIS — Z880 Allergy status to penicillin: Secondary | ICD-10-CM | POA: Insufficient documentation

## 2018-03-14 DIAGNOSIS — Z886 Allergy status to analgesic agent status: Secondary | ICD-10-CM | POA: Diagnosis not present

## 2018-03-14 DIAGNOSIS — Z885 Allergy status to narcotic agent status: Secondary | ICD-10-CM | POA: Insufficient documentation

## 2018-03-14 DIAGNOSIS — Z801 Family history of malignant neoplasm of trachea, bronchus and lung: Secondary | ICD-10-CM | POA: Insufficient documentation

## 2018-03-14 DIAGNOSIS — F17210 Nicotine dependence, cigarettes, uncomplicated: Secondary | ICD-10-CM | POA: Insufficient documentation

## 2018-03-14 DIAGNOSIS — E785 Hyperlipidemia, unspecified: Secondary | ICD-10-CM | POA: Diagnosis not present

## 2018-03-14 DIAGNOSIS — Z833 Family history of diabetes mellitus: Secondary | ICD-10-CM | POA: Diagnosis not present

## 2018-03-14 DIAGNOSIS — Z79899 Other long term (current) drug therapy: Secondary | ICD-10-CM | POA: Diagnosis not present

## 2018-03-14 DIAGNOSIS — F909 Attention-deficit hyperactivity disorder, unspecified type: Secondary | ICD-10-CM | POA: Diagnosis not present

## 2018-03-14 DIAGNOSIS — Z8249 Family history of ischemic heart disease and other diseases of the circulatory system: Secondary | ICD-10-CM | POA: Insufficient documentation

## 2018-03-14 DIAGNOSIS — N3281 Overactive bladder: Secondary | ICD-10-CM | POA: Insufficient documentation

## 2018-03-14 DIAGNOSIS — Z888 Allergy status to other drugs, medicaments and biological substances status: Secondary | ICD-10-CM | POA: Insufficient documentation

## 2018-03-14 DIAGNOSIS — Z90710 Acquired absence of both cervix and uterus: Secondary | ICD-10-CM | POA: Diagnosis not present

## 2018-03-14 DIAGNOSIS — Z881 Allergy status to other antibiotic agents status: Secondary | ICD-10-CM | POA: Insufficient documentation

## 2018-03-14 DIAGNOSIS — K219 Gastro-esophageal reflux disease without esophagitis: Secondary | ICD-10-CM | POA: Insufficient documentation

## 2018-03-14 DIAGNOSIS — N3941 Urge incontinence: Secondary | ICD-10-CM

## 2018-03-14 HISTORY — PX: INTERSTIM IMPLANT PLACEMENT: SHX5130

## 2018-03-14 LAB — GLUCOSE, CAPILLARY: Glucose-Capillary: 268 mg/dL — ABNORMAL HIGH (ref 70–99)

## 2018-03-14 SURGERY — INSERTION, SACRAL NERVE STIMULATOR, INTERSTIM, STAGE 1
Anesthesia: General

## 2018-03-14 MED ORDER — HYDROCODONE-ACETAMINOPHEN 5-325 MG PO TABS: 2.0000 | ORAL_TABLET | Freq: Four times a day (QID) | ORAL | 0 refills | Status: DC | PRN

## 2018-03-14 MED ORDER — PROPOFOL 500 MG/50ML IV EMUL
INTRAVENOUS | Status: DC | PRN
  Administered 2018-03-14: 25 ug/kg/min via INTRAVENOUS

## 2018-03-14 MED ORDER — LACTATED RINGERS IV SOLN
INTRAVENOUS | Status: DC
  Administered 2018-03-14: 10:00:00 via INTRAVENOUS

## 2018-03-14 MED ORDER — STERILE WATER FOR IRRIGATION IR SOLN
Status: DC | PRN
  Administered 2018-03-14: 500 mL

## 2018-03-14 MED ORDER — CEFAZOLIN SODIUM-DEXTROSE 2-4 GM/100ML-% IV SOLN
INTRAVENOUS | Status: AC
  Filled 2018-03-14: qty 100

## 2018-03-14 MED ORDER — MEPERIDINE HCL 50 MG/ML IJ SOLN: 6.2500 mg | INTRAMUSCULAR | Status: DC | PRN

## 2018-03-14 MED ORDER — LIDOCAINE-EPINEPHRINE (PF) 1 %-1:200000 IJ SOLN
INTRAMUSCULAR | Status: DC | PRN
  Administered 2018-03-14: 9 mL
  Administered 2018-03-14: 3 mL
  Administered 2018-03-14: 8 mL
  Administered 2018-03-14: 10 mL

## 2018-03-14 MED ORDER — ONDANSETRON HCL 4 MG/2ML IJ SOLN
INTRAMUSCULAR | Status: AC
  Filled 2018-03-14: qty 2

## 2018-03-14 MED ORDER — PROPOFOL 10 MG/ML IV BOLUS
INTRAVENOUS | Status: DC | PRN
  Administered 2018-03-14 (×3): 20 mg via INTRAVENOUS
  Administered 2018-03-14: 10 mg via INTRAVENOUS
  Administered 2018-03-14: 20 mg via INTRAVENOUS
  Administered 2018-03-14: 10 mg via INTRAVENOUS
  Administered 2018-03-14: 20 mg via INTRAVENOUS

## 2018-03-14 MED ORDER — FENTANYL CITRATE (PF) 100 MCG/2ML IJ SOLN
INTRAMUSCULAR | Status: AC
  Filled 2018-03-14: qty 2

## 2018-03-14 MED ORDER — PROMETHAZINE HCL 25 MG/ML IJ SOLN
6.2500 mg | INTRAMUSCULAR | Status: DC | PRN
  Administered 2018-03-14: 6.25 mg via INTRAVENOUS
  Filled 2018-03-14: qty 1

## 2018-03-14 MED ORDER — LACTATED RINGERS IV SOLN
INTRAVENOUS | Status: DC
  Administered 2018-03-14: 08:00:00 via INTRAVENOUS

## 2018-03-14 MED ORDER — CEFAZOLIN SODIUM-DEXTROSE 2-4 GM/100ML-% IV SOLN
2.0000 g | INTRAVENOUS | Status: AC
  Administered 2018-03-14: 2 g via INTRAVENOUS

## 2018-03-14 MED ORDER — ONDANSETRON HCL 4 MG/2ML IJ SOLN
INTRAMUSCULAR | Status: DC | PRN
  Administered 2018-03-14: 4 mg via INTRAVENOUS

## 2018-03-14 MED ORDER — LIDOCAINE-EPINEPHRINE (PF) 1 %-1:200000 IJ SOLN
INTRAMUSCULAR | Status: AC
  Filled 2018-03-14: qty 30

## 2018-03-14 MED ORDER — SODIUM CHLORIDE 0.9% FLUSH
INTRAVENOUS | Status: AC
  Filled 2018-03-14: qty 10

## 2018-03-14 MED ORDER — HYDROMORPHONE HCL 1 MG/ML IJ SOLN
0.2500 mg | INTRAMUSCULAR | Status: DC | PRN
  Administered 2018-03-14 (×4): 0.5 mg via INTRAVENOUS
  Filled 2018-03-14 (×4): qty 0.5

## 2018-03-14 MED ORDER — PROPOFOL 10 MG/ML IV BOLUS
INTRAVENOUS | Status: AC
  Filled 2018-03-14: qty 40

## 2018-03-14 MED ORDER — MIDAZOLAM HCL 2 MG/2ML IJ SOLN
INTRAMUSCULAR | Status: AC
  Filled 2018-03-14: qty 2

## 2018-03-14 MED ORDER — SULFAMETHOXAZOLE-TRIMETHOPRIM 800-160 MG PO TABS: 1.0000 | ORAL_TABLET | Freq: Two times a day (BID) | ORAL | 0 refills | Status: DC

## 2018-03-14 MED ORDER — MIDAZOLAM HCL 5 MG/5ML IJ SOLN
INTRAMUSCULAR | Status: DC | PRN
  Administered 2018-03-14: 2 mg via INTRAVENOUS

## 2018-03-14 MED ORDER — FENTANYL CITRATE (PF) 100 MCG/2ML IJ SOLN
INTRAMUSCULAR | Status: DC | PRN
  Administered 2018-03-14: 25 ug via INTRAVENOUS
  Administered 2018-03-14: 50 ug via INTRAVENOUS
  Administered 2018-03-14: 25 ug via INTRAVENOUS
  Administered 2018-03-14: 50 ug via INTRAVENOUS
  Administered 2018-03-14 (×2): 25 ug via INTRAVENOUS

## 2018-03-14 MED ORDER — HYDROCODONE-ACETAMINOPHEN 7.5-325 MG PO TABS: 1.0000 | ORAL_TABLET | Freq: Once | ORAL | Status: DC | PRN

## 2018-03-14 SURGICAL SUPPLY — 43 items
CABLE TEST STIMULATION (UROLOGICAL SUPPLIES) ×3 IMPLANT
CABLE TWIST LOCK 25CM (UROLOGICAL SUPPLIES) ×3 IMPLANT
CHLORAPREP W/TINT 26ML (MISCELLANEOUS) ×3 IMPLANT
CLOSURE WOUND 1/2 X4 (GAUZE/BANDAGES/DRESSINGS) ×1
COVER WAND RF STERILE (DRAPES) ×3 IMPLANT
DECANTER SPIKE VIAL GLASS SM (MISCELLANEOUS) ×3 IMPLANT
DERMABOND ADVANCED (GAUZE/BANDAGES/DRESSINGS) ×2
DERMABOND ADVANCED .7 DNX12 (GAUZE/BANDAGES/DRESSINGS) ×1 IMPLANT
DRAPE C-ARM FOLDED MOBILE STRL (DRAPES) ×3 IMPLANT
DRAPE HALF SHEET 40X57 (DRAPES) ×3 IMPLANT
DRAPE INCISE IOBAN 66X45 STRL (DRAPES) ×3 IMPLANT
DRAPE LAPAROSCOPIC ABDOMINAL (DRAPES) ×3 IMPLANT
DRSG TEGADERM 2-3/8X2-3/4 SM (GAUZE/BANDAGES/DRESSINGS) ×3 IMPLANT
DRSG TEGADERM 4X4.75 (GAUZE/BANDAGES/DRESSINGS) ×9 IMPLANT
DRSG TELFA 3X8 NADH (GAUZE/BANDAGES/DRESSINGS) ×3 IMPLANT
ELECT REM PT RETURN 9FT ADLT (ELECTROSURGICAL) ×3
ELECTRODE REM PT RTRN 9FT ADLT (ELECTROSURGICAL) ×1 IMPLANT
GLOVE BIO SURGEON STRL SZ8 (GLOVE) ×3 IMPLANT
GLOVE ECLIPSE 6.5 STRL STRAW (GLOVE) ×3 IMPLANT
GOWN STRL REUS W/ TWL LRG LVL3 (GOWN DISPOSABLE) ×1 IMPLANT
GOWN STRL REUS W/ TWL XL LVL3 (GOWN DISPOSABLE) ×1 IMPLANT
GOWN STRL REUS W/TWL LRG LVL3 (GOWN DISPOSABLE) ×2
GOWN STRL REUS W/TWL XL LVL3 (GOWN DISPOSABLE) ×2
INTRODUCER GUIDE DILATR SHEATH (SET/KITS/TRAYS/PACK) ×3 IMPLANT
KIT INTERSTIM LEAD TINED 28CM (Urological Implant) ×3 IMPLANT
KIT TURNOVER CYSTO (KITS) ×3 IMPLANT
MANIFOLD NEPTUNE II (INSTRUMENTS) ×3 IMPLANT
NEEDLE HYPO 22GX1.5 SAFETY (NEEDLE) ×3 IMPLANT
NEUROSTIMULATOR 1.7X2X.06 (UROLOGICAL SUPPLIES) ×3 IMPLANT
PACK MINOR (CUSTOM PROCEDURE TRAY) ×3 IMPLANT
SET BASIN LINEN APH (SET/KITS/TRAYS/PACK) ×3 IMPLANT
SPONGE GAUZE 2X2 8PLY STER LF (GAUZE/BANDAGES/DRESSINGS) ×1
SPONGE GAUZE 2X2 8PLY STRL LF (GAUZE/BANDAGES/DRESSINGS) ×2 IMPLANT
STRIP CLOSURE SKIN 1/2X4 (GAUZE/BANDAGES/DRESSINGS) ×2 IMPLANT
SUT SILK 2 0 (SUTURE) ×2
SUT SILK 2-0 18XBRD TIE 12 (SUTURE) ×1 IMPLANT
SUT VIC AB 3-0 SH 27 (SUTURE) ×4
SUT VIC AB 3-0 SH 27X BRD (SUTURE) ×2 IMPLANT
SUT VICRYL 4-0 PS2 18IN ABS (SUTURE) ×6 IMPLANT
SYR 10ML LL (SYRINGE) ×3 IMPLANT
SYR BULB IRRIGATION 50ML (SYRINGE) ×3 IMPLANT
SYR CONTROL 10ML LL (SYRINGE) ×3 IMPLANT
WATER STERILE IRR 500ML POUR (IV SOLUTION) ×3 IMPLANT

## 2018-03-14 NOTE — Anesthesia Postprocedure Evaluation (Signed)
Anesthesia Post Note  Patient: Sandra [name]  Procedure(s) Performed: [facility] IMPLANT FIRST STAGE (N/A )  Patient location during evaluation: PACU Anesthesia Type: General Level of consciousness: awake and alert and oriented Pain management: pain level controlled Vital Signs Assessment: post-procedure vital signs reviewed and stable Respiratory status: spontaneous breathing Cardiovascular status: blood pressure returned to baseline and stable Postop Assessment: no apparent nausea or vomiting Anesthetic complications: no Comments: Late entry     Last Vitals:  Vitals:   03/14/18 1000 03/14/18 1009  BP: 120/65   Pulse: (!) 54   Resp: 13 16  Temp:  36.5 C  SpO2: 94% 96%    Last Pain:  Vitals:   03/14/18 1009  TempSrc: Oral  PainSc: 7                  Alpha Mysliwiec

## 2018-03-14 NOTE — Discharge Instructions (Signed)
Percutaneous Nerve Evaluation for Sacral Nerve Stimulation, Care After °Refer to this sheet in the next few weeks. These instructions provide you with information about caring for yourself after your procedure. Your health care provider may also give you more specific instructions. Your treatment has been planned according to current medical practices, but problems sometimes occur. Call your health care provider if you have any problems or questions after your procedure. °What can I expect after the procedure? °After your procedure, it is common to have soreness or pain in the area where the needles were inserted. °Follow these instructions at home: °Activity °· Follow instructions from your health care provider about any restrictions on activities or movements during the test period. You may need to avoid: °? Bending, twisting, or stretching. °? Having sex. °· Do not lift anything that is heavier than 10 lb (4.5 kg). °Bathing °· Do not take baths, swim, or use a hot tub until your health care provider approves. Ask your health care provider if you can take showers. You may only be allowed to take sponge baths for bathing. °· Keep the bandage (dressing) dry until your health care provider says it can be removed. Also keep the electrodes and the nerve stimulator device dry. °Puncture Site Care ° °· Follow instructions from your health care provider about how to take care of your puncture site. Make sure you: °? Wash your hands with soap and water before you change your dressing. If soap and water are not available, use hand sanitizer. °? Change your dressing as told by your health care provider. °· Check your puncture area every day for signs of infection. Check for: °? More redness, swelling, or pain. °? More fluid or blood. °? Warmth. °? Pus or a bad smell. °General instructions °· Use a journal to keep track of your symptoms during the test period. Write down your bladder or bowel activity and symptoms as told by  your health care provider. °· Take over-the-counter and prescription medicines only as told by your health care provider. °· Do not drive for 24 hours if you received a medicine to help you relax (sedative). °· Do not drive or operate heavy machinery while taking prescription pain medicine. °· Keep all follow-up visits as told by your health care provider. This is important. °Use Instructions for the Sacral Nerve Stimulator Device °· Follow instructions from your health care provider about how to use the nerve stimulator device. °· If you see any other health care providers during the test period, tell them that you have this device. °Contact a health care provider if: °· The electrodes get damaged. °· The length of the electrodes seems to change significantly. This could mean that the electrodes have moved out of place. °· The nerve stimulator device gets damaged. °· The electrodes or the nerve stimulator device gets wet. °· You have more redness, swelling, or pain around your puncture area. °· You have more fluid or blood coming from your puncture area. °· Your puncture area feels warm to the touch. °· You have pus or a bad smell coming from your puncture area. °· You have a fever. °This information is not intended to replace advice given to you by your health care provider. Make sure you discuss any questions you have with your health care provider. °Document Released: 09/23/2011 Document Revised: 08/29/2015 Document Reviewed: 11/07/2014 °Elsevier Interactive Patient Education © 2019 Elsevier Inc[employer] ° °

## 2018-03-14 NOTE — Anesthesia Preprocedure Evaluation (Signed)
Anesthesia Evaluation    Airway Mallampati: II       Dental  (+) Edentulous Lower, Edentulous Upper   Pulmonary Current Smoker,     + decreased breath sounds      Cardiovascular hypertension, On Medications  Rhythm:regular     Neuro/Psych PSYCHIATRIC DISORDERS Anxiety Depression Bipolar Disorder  Neuromuscular disease    GI/Hepatic GERD  ,  Endo/Other  diabetes  Renal/GU      Musculoskeletal   Abdominal   Peds  Hematology   Anesthesia Other Findings FBS 268 Hacking cough.  States non-productive, denies sputum of ant amount  Reproductive/Obstetrics                             Anesthesia Physical Anesthesia Plan  ASA: III  Anesthesia Plan: General   Post-op Pain Management:    Induction:   PONV Risk Score and Plan:   Airway Management Planned:   Additional Equipment:   Intra-op Plan:   Post-operative Plan:   Informed Consent: I have reviewed the patients History and Physical, chart, labs and discussed the procedure including the risks, benefits and alternatives for the proposed anesthesia with the patient or authorized representative who has indicated his/her understanding and acceptance.       Plan Discussed with: Anesthesiologist  Anesthesia Plan Comments:         Anesthesia Quick Evaluation

## 2018-03-14 NOTE — H&P (View-Only) (Signed)
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 She has failed both anticholinergics and beta3s. She has a hx of bladder cancer treated with BCG. She has severe urgency, frequency and urge incontinence. No dysuria or hematuria  Past Medical History:  Diagnosis Date  . ADHD (attention deficit hyperactivity disorder)   . Allergic rhinitis   . Anemia    hx of  . Bipolar 1 disorder ([place])   . Bladder tumor   . Cancer ([place])    bladder  . Dizziness   . Full dentures   . GERD (gastroesophageal reflux disease)   . Gross hematuria   . Hyperlipidemia   . Hypertension   . Type 2 diabetes mellitus ([place])   . Urgency of urination    dysuria, sui  . Wears glasses    Past Surgical History:  Procedure Laterality Date  . CYSTOSCOPY N/A 10/03/2015   Procedure: CYSTOSCOPY;  Surgeon: Irine Seal, MD;  Location: [facility];  Service: Urology;  Laterality: N/A;  . CYSTOSCOPY WITH STENT PLACEMENT Right 10/31/2015   Procedure: CYSTOSCOPY WITH ATTEMPTED RIGHT URETERAL OPENING;  Surgeon: Irine Seal, MD;  Location: [facility];  Service: Urology;  Laterality: Right;  . MULTIPLE TOOTH EXTRACTIONS  2015  . OVARIAN CYST REMOVAL Right 2004 approx  . TONSILLECTOMY  12/30/2004  . TOTAL ABDOMINAL HYSTERECTOMY W/ BILATERAL SALPINGOOPHORECTOMY  05/16/2004  . TRANSURETHRAL RESECTION OF BLADDER TUMOR N/A 10/03/2015   Procedure: TRANSURETHRAL RESECTION OF BLADDER TUMOR (TURBT);  Surgeon: Irine Seal, MD;  Location: [facility];  Service: Urology;  Laterality: N/A;  . TRANSURETHRAL RESECTION OF BLADDER TUMOR N/A 10/31/2015   Procedure: RE-STAGINGG TRANSURETHRAL RESECTION OF BLADDER TUMOR (TURBT);  Surgeon: Irine Seal, MD;  Location: [facility];  Service: Urology;  Laterality: N/A;  . TRANSURETHRAL RESECTION OF BLADDER TUMOR N/A  08/17/2016   Procedure: TRANSURETHRAL RESECTION OF BLADDER TUMOR (TURBT);  Surgeon: Cleon Gustin, MD;  Location: [facility];  Service: Urology;  Laterality: N/A;    Home Medications:  Current Facility-Administered Medications  Medication Dose Route Frequency Provider Last Rate Last Dose  . ceFAZolin (ANCEF) IVPB 2g/100 mL premix  2 g Intravenous 30 min Pre-Op Shaine Mount, Candee Furbish, MD      . lactated ringers infusion   Intravenous Continuous Cameransi, Darnell Level, MD       Allergies:  Allergies  Allergen Reactions  . Benazepril Hives  . Ibuprofen Hives    itching  . Metronidazole Nausea And Vomiting  . Oxycodone   . Adhesive [Tape] Rash  . Codeine Nausea And Vomiting and Rash  . Loratadine Rash  . Nystatin-Triamcinolone Rash  . Penicillins Rash    Has patient had a PCN reaction causing immediate rash, facial/tongue/throat swelling, SOB or lightheadedness with hypotension: No Has patient had a PCN reaction causing severe rash involving mucus membranes or skin necrosis: Yes Has patient had a PCN reaction that required hospitalization: No Has patient had a PCN reaction occurring within the last 10 years: no  If all of the above answers are "NO", then may proceed with Cephalosporin use.    Family History  Problem Relation Age of Onset  . Hypertension Mother   . Cancer Mother   . Breast cancer Maternal Aunt   . Lung cancer Maternal Uncle   . Cancer Other   . Diabetes Daughter    Social History:  reports that she has been smoking cigarettes. She has been smoking about 0.50 packs per day. She has never used smokeless tobacco. She reports that she does not drink alcohol or use drugs.  Review of Systems  Genitourinary: Positive for frequency and urgency.  All other systems reviewed and are negative.   Physical Exam:  Vital signs in last 24 hours: Temp:  [98.2 F (36.8 C)] 98.2 F (36.8 C) (03/02 0723) Pulse Rate:  [85] 85 (03/02 0723) Resp:  [18] 18 (03/02 0723) BP:  (164)/(106) 164/106 (03/02 0723) SpO2:  [99 %] 99 % (03/02 0723) Weight:  [65.3 kg] 65.3 kg (03/02 0723) Physical Exam  Constitutional: She is oriented to person, place, and time. She appears well-developed and well-nourished.  HENT:  Head: Normocephalic and atraumatic.  Eyes: Pupils are equal, round, and reactive to light. EOM are normal.  Neck: Normal range of motion. No thyromegaly present.  Cardiovascular: Normal rate and regular rhythm.  Respiratory: Effort normal. No respiratory distress.  GI: Soft. She exhibits no distension.  Musculoskeletal: Normal range of motion.        General: No edema.  Neurological: She is alert and oriented to person, place, and time.  Skin: Skin is warm and dry.  Psychiatric: She has a normal mood and affect. Her behavior is normal. Judgment and thought content normal.    Laboratory Data:  Results for orders placed or performed during the hospital encounter of 03/14/18 (from the past 24 hour(s))  Glucose, capillary     Status: Abnormal   Collection Time: 03/14/18  7:09 AM  Result Value Ref Range   Glucose-Capillary 268 (H) 70 - 99 mg/dL   No results found for this or any previous visit (from the past 240 hour(s)). Creatinine: No results for input(s): CREATININE in the last 168 hours. Baseline Creatinine: unknown  Impression/Assessment:  43yo with OAB refractory to medical therapy  Plan:  The risks/benefits/alternatives to Stage 1 interstim was explained to the patient and she understands and wishes to proceed with surgery  [name] 03/14/2018, 7:50 AM

## 2018-03-14 NOTE — Transfer of Care (Signed)
Immediate Anesthesia Transfer of Care Note  Patient: Sandra [name]  Procedure(s) Performed: Barrie Lyme IMPLANT FIRST STAGE (N/A )  Patient Location: PACU  Anesthesia Type:General  Level of Consciousness: awake, alert  and oriented  Airway & Oxygen Therapy: Patient Spontanous Breathing  Post-op Assessment: Post -op Vital signs reviewed and stable  Post vital signs: Reviewed and stable  Last Vitals:  Vitals Value Taken Time  BP    Temp    Pulse 58 03/14/2018  9:16 AM  Resp 22 03/14/2018  9:15 AM  SpO2 95 % 03/14/2018  9:16 AM  Vitals shown include unvalidated device data.  Last Pain:  Vitals:   03/14/18 0723  TempSrc: Oral  PainSc: 0-No pain      Patients Stated Pain Goal: 5 (43/53/91 2258)  Complications: No apparent anesthesia complications

## 2018-03-14 NOTE — Op Note (Signed)
Preoperative diagnosis: OAB  Postoperative diagnosis: Same  Procedure: Placement of InterStim stage I and impedance check  Surgeon: Dr. [name]  Assistant: None  Antibiotics: Ancef  Drains: None  Indications: The patient is a 43yo with a hx of OAb which is refractory to medical therapy. After discussing treatment options she has elected to proceed with Interstim Stage 1 implantation  Procedure in detail: Prior to the procedure consent was obtained. The patient was brought to the operating room and a brief time out was completed to ensure correct patient, correct procedure and correct site. Preoperative antibiotics were given. Extra care was taken positioning the patient in a prone position. Usual skin preparation was utilized.  Using lateral and AP fluoroscopy I marked S3. After several minutes of testing I was in the S4 foramina with a bellows response and the foramina was below the bone knuckle noted on x-ray. Eventually I was in S3 on the patient's left side above the knuckle.  Approximate 12 mL of a lidocaine epinephrine mixture was utilized in the midline. Bony table was anesthetized. The 5 inch foramen needle was introduced into the S3 foramina as noted above. At very low setting she had an excellent bellows and toe response  The inner aspect of the foramen needle was removed and the guide was placed to the appropriate depth removing the framing needle. Under fluoroscopic guidance after making a 1 cm incision the white trocar was passed to the appropriate depth. The lead with a hockey stick bend was placed to the appropriate depth just bridging the bone medially. She had excellent bellows and toe response at all 4 settings.  Under fluoroscopic guidance the inner aspect of the lead was removed. X-rays were taken.  A 2 cm left upper mid buttock incision was made. 10 mL of a lidocaine epinephrine mixture were utilized. It was carried down to appropriate depth and a made an  appropriate sized pocket.  With the passer I passed the lead from medial to lateral and connected. We then passed the lead across the midline and to the right upper buttock. The leas was then brought through this incision. The incisions were then closed with running 3-0 vicryl.  Impedance check was done utilizing sterile technique and was normal in all 4 positions We then placed dermabond on the incisions and this concluded the procedure which was well tolerated by the patient.  Complications: none  Condition: stable, transferred to PACU  Plan: The patient is to be discharged home after she voids in the PACU. If she cannot void the foley will be replaced and she will followup in 2-3 days for a second voiding trial. If she is able to void she will keep a voiding diary and will be reassessed in 1 week for response.Preoperative diagnosis: Unobstructed Urinary retnetion

## 2018-03-14 NOTE — H&P (Signed)
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 She has failed both anticholinergics and beta3s. She has a hx of bladder cancer treated with BCG. She has severe urgency, frequency and urge incontinence. No dysuria or hematuria  Past Medical History:  Diagnosis Date  . ADHD (attention deficit hyperactivity disorder)   . Allergic rhinitis   . Anemia    hx of  . Bipolar 1 disorder ([place])   . Bladder tumor   . Cancer ([place])    bladder  . Dizziness   . Full dentures   . GERD (gastroesophageal reflux disease)   . Gross hematuria   . Hyperlipidemia   . Hypertension   . Type 2 diabetes mellitus ([place])   . Urgency of urination    dysuria, sui  . Wears glasses    Past Surgical History:  Procedure Laterality Date  . CYSTOSCOPY N/A 10/03/2015   Procedure: CYSTOSCOPY;  Surgeon: Irine Seal, MD;  Location: [facility];  Service: Urology;  Laterality: N/A;  . CYSTOSCOPY WITH STENT PLACEMENT Right 10/31/2015   Procedure: CYSTOSCOPY WITH ATTEMPTED RIGHT URETERAL OPENING;  Surgeon: Irine Seal, MD;  Location: [facility];  Service: Urology;  Laterality: Right;  . MULTIPLE TOOTH EXTRACTIONS  2015  . OVARIAN CYST REMOVAL Right 2004 approx  . TONSILLECTOMY  12/30/2004  . TOTAL ABDOMINAL HYSTERECTOMY W/ BILATERAL SALPINGOOPHORECTOMY  05/16/2004  . TRANSURETHRAL RESECTION OF BLADDER TUMOR N/A 10/03/2015   Procedure: TRANSURETHRAL RESECTION OF BLADDER TUMOR (TURBT);  Surgeon: Irine Seal, MD;  Location: [facility];  Service: Urology;  Laterality: N/A;  . TRANSURETHRAL RESECTION OF BLADDER TUMOR N/A 10/31/2015   Procedure: RE-STAGINGG TRANSURETHRAL RESECTION OF BLADDER TUMOR (TURBT);  Surgeon: Irine Seal, MD;  Location: [facility];  Service: Urology;  Laterality: N/A;  . TRANSURETHRAL RESECTION OF BLADDER TUMOR N/A  08/17/2016   Procedure: TRANSURETHRAL RESECTION OF BLADDER TUMOR (TURBT);  Surgeon: Cleon Gustin, MD;  Location: [facility];  Service: Urology;  Laterality: N/A;    Home Medications:  Current Facility-Administered Medications  Medication Dose Route Frequency Provider Last Rate Last Dose  . ceFAZolin (ANCEF) IVPB 2g/100 mL premix  2 g Intravenous 30 min Pre-Op McKenzie, Candee Furbish, MD      . lactated ringers infusion   Intravenous Continuous Cameransi, Darnell Level, MD       Allergies:  Allergies  Allergen Reactions  . Benazepril Hives  . Ibuprofen Hives    itching  . Metronidazole Nausea And Vomiting  . Oxycodone   . Adhesive [Tape] Rash  . Codeine Nausea And Vomiting and Rash  . Loratadine Rash  . Nystatin-Triamcinolone Rash  . Penicillins Rash    Has patient had a PCN reaction causing immediate rash, facial/tongue/throat swelling, SOB or lightheadedness with hypotension: No Has patient had a PCN reaction causing severe rash involving mucus membranes or skin necrosis: Yes Has patient had a PCN reaction that required hospitalization: No Has patient had a PCN reaction occurring within the last 10 years: no  If all of the above answers are "NO", then may proceed with Cephalosporin use.    Family History  Problem Relation Age of Onset  . Hypertension Mother   . Cancer Mother   . Breast cancer Maternal Aunt   . Lung cancer Maternal Uncle   . Cancer Other   . Diabetes Daughter    Social History:  reports that she has been smoking cigarettes. She has been smoking about 0.50 packs per day. She has never used smokeless tobacco. She reports that she does not drink alcohol or use drugs.  Review of Systems  Genitourinary: Positive for frequency and urgency.  All other systems reviewed and are negative.   Physical Exam:  Vital signs in last 24 hours: Temp:  [98.2 F (36.8 C)] 98.2 F (36.8 C) (03/02 0723) Pulse Rate:  [85] 85 (03/02 0723) Resp:  [18] 18 (03/02 0723) BP:  (164)/(106) 164/106 (03/02 0723) SpO2:  [99 %] 99 % (03/02 0723) Weight:  [65.3 kg] 65.3 kg (03/02 0723) Physical Exam  Constitutional: She is oriented to person, place, and time. She appears well-developed and well-nourished.  HENT:  Head: Normocephalic and atraumatic.  Eyes: Pupils are equal, round, and reactive to light. EOM are normal.  Neck: Normal range of motion. No thyromegaly present.  Cardiovascular: Normal rate and regular rhythm.  Respiratory: Effort normal. No respiratory distress.  GI: Soft. She exhibits no distension.  Musculoskeletal: Normal range of motion.        General: No edema.  Neurological: She is alert and oriented to person, place, and time.  Skin: Skin is warm and dry.  Psychiatric: She has a normal mood and affect. Her behavior is normal. Judgment and thought content normal.    Laboratory Data:  Results for orders placed or performed during the hospital encounter of 03/14/18 (from the past 24 hour(s))  Glucose, capillary     Status: Abnormal   Collection Time: 03/14/18  7:09 AM  Result Value Ref Range   Glucose-Capillary 268 (H) 70 - 99 mg/dL   No results found for this or any previous visit (from the past 240 hour(s)). Creatinine: No results for input(s): CREATININE in the last 168 hours. Baseline Creatinine: unknown  Impression/Assessment:  43yo with OAB refractory to medical therapy  Plan:  The risks/benefits/alternatives to Stage 1 interstim was explained to the patient and she understands and wishes to proceed with surgery  [name] 03/14/2018, 7:50 AM

## 2018-03-15 ENCOUNTER — Encounter (HOSPITAL_COMMUNITY): Payer: Self-pay | Admitting: Urology

## 2018-03-21 ENCOUNTER — Other Ambulatory Visit: Payer: Self-pay | Admitting: Urology

## 2018-03-23 ENCOUNTER — Other Ambulatory Visit: Payer: Self-pay

## 2018-03-23 ENCOUNTER — Ambulatory Visit (INDEPENDENT_AMBULATORY_CARE_PROVIDER_SITE_OTHER): Payer: Medicaid Other | Admitting: Urology

## 2018-03-23 DIAGNOSIS — N3281 Overactive bladder: Secondary | ICD-10-CM

## 2018-03-25 ENCOUNTER — Other Ambulatory Visit: Payer: Self-pay

## 2018-03-25 ENCOUNTER — Encounter (HOSPITAL_COMMUNITY)
Admission: RE | Admit: 2018-03-25 | Discharge: 2018-03-25 | Disposition: A | Discharge status: Home / Self Care | Payer: Medicaid Other | Source: Ambulatory Visit | Attending: Urology | Admitting: Urology

## 2018-03-25 ENCOUNTER — Encounter (HOSPITAL_COMMUNITY): Payer: Self-pay

## 2018-03-30 ENCOUNTER — Encounter (HOSPITAL_COMMUNITY): Payer: Self-pay

## 2018-03-30 ENCOUNTER — Other Ambulatory Visit: Payer: Self-pay

## 2018-03-30 ENCOUNTER — Ambulatory Visit (HOSPITAL_COMMUNITY): Payer: Medicaid Other | Admitting: Anesthesiology

## 2018-03-30 ENCOUNTER — Encounter (HOSPITAL_COMMUNITY)
Admission: RE | Disposition: A | Discharge status: Home / Self Care | Payer: Self-pay | Source: Home / Self Care | Attending: Urology

## 2018-03-30 ENCOUNTER — Ambulatory Visit (HOSPITAL_COMMUNITY)
Admission: RE | Admit: 2018-03-30 | Discharge: 2018-03-30 | Disposition: A | Discharge status: Home / Self Care | Payer: Medicaid Other | Attending: Urology | Admitting: Urology

## 2018-03-30 DIAGNOSIS — Z79899 Other long term (current) drug therapy: Secondary | ICD-10-CM | POA: Insufficient documentation

## 2018-03-30 DIAGNOSIS — N319 Neuromuscular dysfunction of bladder, unspecified: Secondary | ICD-10-CM | POA: Insufficient documentation

## 2018-03-30 DIAGNOSIS — F17210 Nicotine dependence, cigarettes, uncomplicated: Secondary | ICD-10-CM | POA: Insufficient documentation

## 2018-03-30 DIAGNOSIS — I10 Essential (primary) hypertension: Secondary | ICD-10-CM | POA: Diagnosis not present

## 2018-03-30 DIAGNOSIS — K219 Gastro-esophageal reflux disease without esophagitis: Secondary | ICD-10-CM | POA: Insufficient documentation

## 2018-03-30 DIAGNOSIS — R339 Retention of urine, unspecified: Secondary | ICD-10-CM | POA: Diagnosis not present

## 2018-03-30 DIAGNOSIS — N3281 Overactive bladder: Secondary | ICD-10-CM | POA: Insufficient documentation

## 2018-03-30 DIAGNOSIS — E119 Type 2 diabetes mellitus without complications: Secondary | ICD-10-CM | POA: Insufficient documentation

## 2018-03-30 DIAGNOSIS — Z8551 Personal history of malignant neoplasm of bladder: Secondary | ICD-10-CM | POA: Insufficient documentation

## 2018-03-30 HISTORY — PX: INTERSTIM IMPLANT PLACEMENT: SHX5130

## 2018-03-30 LAB — GLUCOSE, CAPILLARY: Glucose-Capillary: 350 mg/dL — ABNORMAL HIGH (ref 70–99)

## 2018-03-30 SURGERY — INSERTION, SACRAL NERVE STIMULATOR, INTERSTIM, STAGE 2
Anesthesia: Monitor Anesthesia Care

## 2018-03-30 MED ORDER — PROPOFOL 500 MG/50ML IV EMUL
INTRAVENOUS | Status: DC | PRN
  Administered 2018-03-30: 125 ug/kg/min via INTRAVENOUS

## 2018-03-30 MED ORDER — PROMETHAZINE HCL 25 MG/ML IJ SOLN: 6.2500 mg | INTRAMUSCULAR | Status: DC | PRN

## 2018-03-30 MED ORDER — CEFAZOLIN SODIUM-DEXTROSE 2-4 GM/100ML-% IV SOLN
2.0000 g | INTRAVENOUS | Status: AC
  Administered 2018-03-30: 2 g via INTRAVENOUS
  Filled 2018-03-30: qty 100

## 2018-03-30 MED ORDER — HYDROMORPHONE HCL 1 MG/ML IJ SOLN
INTRAMUSCULAR | Status: AC
  Filled 2018-03-30: qty 0.5

## 2018-03-30 MED ORDER — MIDAZOLAM HCL 2 MG/2ML IJ SOLN
INTRAMUSCULAR | Status: AC
  Filled 2018-03-30: qty 2

## 2018-03-30 MED ORDER — KETAMINE HCL 50 MG/5ML IJ SOSY
PREFILLED_SYRINGE | INTRAMUSCULAR | Status: AC
  Filled 2018-03-30: qty 5

## 2018-03-30 MED ORDER — LIDOCAINE HCL (PF) 1 % IJ SOLN
INTRAMUSCULAR | Status: DC | PRN
  Administered 2018-03-30: 10 mL

## 2018-03-30 MED ORDER — PROPOFOL 10 MG/ML IV BOLUS
INTRAVENOUS | Status: AC
  Filled 2018-03-30: qty 40

## 2018-03-30 MED ORDER — ONDANSETRON HCL 4 MG/2ML IJ SOLN
4.0000 mg | Freq: Once | INTRAMUSCULAR | Status: AC
  Administered 2018-03-30: 4 mg via INTRAVENOUS

## 2018-03-30 MED ORDER — STERILE WATER FOR IRRIGATION IR SOLN
Status: DC | PRN
  Administered 2018-03-30: 1000 mL

## 2018-03-30 MED ORDER — FENTANYL CITRATE (PF) 100 MCG/2ML IJ SOLN
INTRAMUSCULAR | Status: AC
  Filled 2018-03-30: qty 2

## 2018-03-30 MED ORDER — HYDROCODONE-ACETAMINOPHEN 5-325 MG PO TABS: 2.0000 | ORAL_TABLET | Freq: Four times a day (QID) | ORAL | 0 refills | Status: DC | PRN

## 2018-03-30 MED ORDER — MIDAZOLAM HCL 2 MG/2ML IJ SOLN: 0.5000 mg | Freq: Once | INTRAMUSCULAR | Status: DC | PRN

## 2018-03-30 MED ORDER — PROPOFOL 10 MG/ML IV BOLUS
INTRAVENOUS | Status: DC | PRN
  Administered 2018-03-30 (×3): 20 mg via INTRAVENOUS

## 2018-03-30 MED ORDER — LACTATED RINGERS IV SOLN
INTRAVENOUS | Status: DC
  Administered 2018-03-30: 50 mL/h via INTRAVENOUS

## 2018-03-30 MED ORDER — FENTANYL CITRATE (PF) 100 MCG/2ML IJ SOLN
INTRAMUSCULAR | Status: DC | PRN
  Administered 2018-03-30 (×2): 50 ug via INTRAVENOUS

## 2018-03-30 MED ORDER — HYDROMORPHONE HCL 1 MG/ML IJ SOLN
0.5000 mg | INTRAMUSCULAR | Status: DC | PRN
  Administered 2018-03-30 (×3): 0.5 mg via INTRAVENOUS
  Filled 2018-03-30 (×2): qty 0.5

## 2018-03-30 MED ORDER — MIDAZOLAM HCL 5 MG/5ML IJ SOLN
INTRAMUSCULAR | Status: DC | PRN
  Administered 2018-03-30: 2 mg via INTRAVENOUS

## 2018-03-30 MED ORDER — KETAMINE HCL 10 MG/ML IJ SOLN
INTRAMUSCULAR | Status: DC | PRN
  Administered 2018-03-30 (×2): 5 mg via INTRAVENOUS
  Administered 2018-03-30: 10 mg via INTRAVENOUS

## 2018-03-30 MED ORDER — INSULIN ASPART 100 UNIT/ML ~~LOC~~ SOLN
5.0000 [IU] | Freq: Once | SUBCUTANEOUS | Status: AC
  Administered 2018-03-30: 5 [IU] via SUBCUTANEOUS
  Filled 2018-03-30: qty 0.05

## 2018-03-30 MED ORDER — LIDOCAINE HCL (PF) 1 % IJ SOLN
INTRAMUSCULAR | Status: AC
  Filled 2018-03-30: qty 30

## 2018-03-30 MED ORDER — ONDANSETRON HCL 4 MG/2ML IJ SOLN
INTRAMUSCULAR | Status: AC
  Filled 2018-03-30: qty 2

## 2018-03-30 SURGICAL SUPPLY — 29 items
COVER LIGHT HANDLE STERIS (MISCELLANEOUS) ×6 IMPLANT
COVER PROBE W GEL 5X96 (DRAPES) ×3 IMPLANT
COVER WAND RF STERILE (DRAPES) ×3 IMPLANT
DECANTER SPIKE VIAL GLASS SM (MISCELLANEOUS) ×3 IMPLANT
DERMABOND ADVANCED (GAUZE/BANDAGES/DRESSINGS) ×2
DERMABOND ADVANCED .7 DNX12 (GAUZE/BANDAGES/DRESSINGS) ×1 IMPLANT
ELECT REM PT RETURN 9FT ADLT (ELECTROSURGICAL)
ELECTRODE REM PT RTRN 9FT ADLT (ELECTROSURGICAL) IMPLANT
GAUZE SPONGE 4X4 12PLY STRL (GAUZE/BANDAGES/DRESSINGS) ×3 IMPLANT
GLOVE BIO SURGEON STRL SZ8 (GLOVE) ×3 IMPLANT
GLOVE BIOGEL M 7.0 STRL (GLOVE) ×3 IMPLANT
GLOVE INDICATOR 7.0 STRL GRN (GLOVE) ×6 IMPLANT
GOWN STRL REUS W/ TWL LRG LVL3 (GOWN DISPOSABLE) ×1 IMPLANT
GOWN STRL REUS W/ TWL XL LVL3 (GOWN DISPOSABLE) ×1 IMPLANT
GOWN STRL REUS W/TWL LRG LVL3 (GOWN DISPOSABLE) ×2
GOWN STRL REUS W/TWL XL LVL3 (GOWN DISPOSABLE) ×2
KIT TURNOVER CYSTO (KITS) ×3 IMPLANT
NEEDLE HYPO 22GX1.5 SAFETY (NEEDLE) IMPLANT
PACK MINOR (CUSTOM PROCEDURE TRAY) IMPLANT
PAD ARMBOARD 7.5X6 YLW CONV (MISCELLANEOUS) ×3 IMPLANT
PROGRAMMER SMART TH90G01 (UROLOGICAL SUPPLIES) ×3 IMPLANT
SET BASIN LINEN APH (SET/KITS/TRAYS/PACK) ×3 IMPLANT
SOL PREP PROV IODINE SCRUB 4OZ (MISCELLANEOUS) ×6 IMPLANT
STIMULATOR INTERSTIM 2X1.7X.3 (Orthopedic Implant) ×3 IMPLANT
SUT MNCRL AB 4-0 PS2 18 (SUTURE) ×3 IMPLANT
SUT VIC AB 3-0 SH 27 (SUTURE) ×2
SUT VIC AB 3-0 SH 27X BRD (SUTURE) ×1 IMPLANT
SYR CONTROL 10ML LL (SYRINGE) IMPLANT
WATER STERILE IRR 1000ML POUR (IV SOLUTION) ×3 IMPLANT

## 2018-03-30 NOTE — Anesthesia Preprocedure Evaluation (Signed)
Anesthesia Evaluation  Patient identified by MRN, date of birth, ID band Patient awake    Reviewed: Allergy & Precautions, NPO status , Patient's Chart, lab work & pertinent test results  Airway Mallampati: I  TM Distance: >3 FB Neck ROM: Full    Dental no notable dental hx. (+) Edentulous Lower, Edentulous Upper   Pulmonary neg pulmonary ROS, Current Smoker,    Pulmonary exam normal breath sounds clear to auscultation       Cardiovascular Exercise Tolerance: Good hypertension, Pt. on medications negative cardio ROS Normal cardiovascular examI Rhythm:Regular Rate:Normal     Neuro/Psych Anxiety Depression Bipolar Disorder  Neuromuscular disease negative psych ROS   GI/Hepatic Neg liver ROS, GERD  Medicated and Controlled,  Endo/Other  negative endocrine ROSdiabetes, Type 2  Renal/GU negative Renal ROS  negative genitourinary   Musculoskeletal negative musculoskeletal ROS (+)   Abdominal   Peds negative pediatric ROS (+)  Hematology negative hematology ROS (+) anemia ,   Anesthesia Other Findings   Reproductive/Obstetrics negative OB ROS                             Anesthesia Physical Anesthesia Plan  ASA: III  Anesthesia Plan: MAC   Post-op Pain Management:    Induction:   PONV Risk Score and Plan:   Airway Management Planned:   Additional Equipment:   Intra-op Plan:   Post-operative Plan:   Informed Consent:   Plan Discussed with:   Anesthesia Plan Comments: (Prone MAC planned -GA as needed)        Anesthesia Quick Evaluation

## 2018-03-30 NOTE — Op Note (Signed)
Preoperative diagnosis: OAB  Postoperative diagnosis: Same  Procedure: Placement of InterStim stage 2 and impedance check  Surgeon: Dr. [name]  Assistant: None  Antibiotics: Ancef  Drains: None  Indications: The patient is a 43yo with a hx of neurogenic bladder and unobstructed urinary retention. She has Stage 1 interstim  2 weeks ago and has been able to void normally since placement with minimal PVR. After discussing treatment options she has elected to proceed with Interstim Stage 2 implantation  Procedure in detail: Prior to the procedure consent was obtained. The patient was brought to the operating room and a brief time out was completed to ensure correct patient, correct procedure and correct site. Preoperative antibiotics were given. Extra care was taken positioning the patient in a prone position. Usual skin preparation was utilized. The previous incision in the left upper buttock was opend and previous vicryl suture removed. We then brought the lead connector into the operative field. We then removed the silk suture exposing the screws holding the temporary lead. We then unscrewed the lead and removed it. We then irrigated the pocket with GU irrigation followed by sterile water. We then attached the generator and battery to the lead and tightened the screws. We then placed the generator in the previous pocket int he left upper buttock. We closed the deep subcutaneous tissue with 2-0 vicryl in a running fashion. The incisions were then closed with running 3-0 vicryl.  Impedance check was done utilizing sterile technique and was normal in all 4 positions We then placed dermabond on the incisions and this concluded the procedure which was well tolerated by the patient.  Complications: none  Condition: stable, transferred to PACU  Plan: The patient is to be discharged home after she voids in the PACU. She is to followup in 4 weeks for wound check

## 2018-03-30 NOTE — Discharge Instructions (Signed)
Percutaneous Nerve Evaluation for Sacral Nerve Stimulation, Care After °Refer to this sheet in the next few weeks. These instructions provide you with information about caring for yourself after your procedure. Your health care provider may also give you more specific instructions. Your treatment has been planned according to current medical practices, but problems sometimes occur. Call your health care provider if you have any problems or questions after your procedure. °What can I expect after the procedure? °After your procedure, it is common to have soreness or pain in the area where the needles were inserted. °Follow these instructions at home: °Activity °· Follow instructions from your health care provider about any restrictions on activities or movements during the test period. You may need to avoid: °? Bending, twisting, or stretching. °? Having sex. °· Do not lift anything that is heavier than 10 lb (4.5 kg). °Bathing °· Do not take baths, swim, or use a hot tub until your health care provider approves. Ask your health care provider if you can take showers. You may only be allowed to take sponge baths for bathing. °· Keep the bandage (dressing) dry until your health care provider says it can be removed. Also keep the electrodes and the nerve stimulator device dry. °Puncture Site Care ° °· Follow instructions from your health care provider about how to take care of your puncture site. Make sure you: °? Wash your hands with soap and water before you change your dressing. If soap and water are not available, use hand sanitizer. °? Change your dressing as told by your health care provider. °· Check your puncture area every day for signs of infection. Check for: °? More redness, swelling, or pain. °? More fluid or blood. °? Warmth. °? Pus or a bad smell. °General instructions °· Use a journal to keep track of your symptoms during the test period. Write down your bladder or bowel activity and symptoms as told by  your health care provider. °· Take over-the-counter and prescription medicines only as told by your health care provider. °· Do not drive for 24 hours if you received a medicine to help you relax (sedative). °· Do not drive or operate heavy machinery while taking prescription pain medicine. °· Keep all follow-up visits as told by your health care provider. This is important. °Use Instructions for the Sacral Nerve Stimulator Device °· Follow instructions from your health care provider about how to use the nerve stimulator device. °· If you see any other health care providers during the test period, tell them that you have this device. °Contact a health care provider if: °· The electrodes get damaged. °· The length of the electrodes seems to change significantly. This could mean that the electrodes have moved out of place. °· The nerve stimulator device gets damaged. °· The electrodes or the nerve stimulator device gets wet. °· You have more redness, swelling, or pain around your puncture area. °· You have more fluid or blood coming from your puncture area. °· Your puncture area feels warm to the touch. °· You have pus or a bad smell coming from your puncture area. °· You have a fever. °This information is not intended to replace advice given to you by your health care provider. Make sure you discuss any questions you have with your health care provider. °Document Released: 09/23/2011 Document Revised: 08/29/2015 Document Reviewed: 11/07/2014 °Elsevier Interactive Patient Education © 2019 Elsevier Inc[employer] ° °

## 2018-03-30 NOTE — Transfer of Care (Signed)
Immediate Anesthesia Transfer of Care Note  Patient: Sandra [name]  Procedure(s) Performed: Barrie Lyme IMPLANT SECOND STAGE (N/A )  Patient Location: PACU  Anesthesia Type:MAC  Level of Consciousness: awake and alert   Airway & Oxygen Therapy: Patient Spontanous Breathing  Post-op Assessment: Report given to RN  Post vital signs: Reviewed and stable  Last Vitals:  Vitals Value Taken Time  BP    Temp    Pulse    Resp    SpO2      Last Pain:  Vitals:   03/30/18 1306  TempSrc: Oral  PainSc: 4       Patients Stated Pain Goal: 5 (05/22/00 1117)  Complications: No apparent anesthesia complications

## 2018-03-30 NOTE — Anesthesia Postprocedure Evaluation (Signed)
Anesthesia Post Note  Patient: Sandra [name]  Procedure(s) Performed: Barrie Lyme IMPLANT SECOND STAGE (N/[name] )  Patient location during evaluation: PACU Anesthesia Type: MAC Level of consciousness: awake and alert and oriented Pain management: pain level controlled Vital Signs Assessment: post-procedure vital signs reviewed and stable Respiratory status: spontaneous breathing Cardiovascular status: stable Postop Assessment: no apparent nausea or vomiting Anesthetic complications: no     Last Vitals:  Vitals:   03/30/18 1306 03/30/18 1500  BP: 132/80 (!) 165/90  Pulse: 74 67  Resp: 16 12  Temp: 36.8 C 36.7 C  SpO2: 97% 100%    Last Pain:  Vitals:   03/30/18 1500  TempSrc:   PainSc: 10-Worst pain ever                 Sandra [name]

## 2018-03-30 NOTE — Interval H&P Note (Signed)
History and Physical Interval Note:  03/30/2018 1:32 PM  Sandra [name]  has presented today for surgery, with the diagnosis of OVERACTIVE BLADDER.  The various methods of treatment have been discussed with the patient and family. After consideration of risks, benefits and other options for treatment, the patient has consented to  Procedure(s) with comments: INTERSTIM IMPLANT SECOND STAGE (N/A) - 30 MINS as a surgical intervention.  The patient's history has been reviewed, patient examined, no change in status, stable for surgery.  I have reviewed the patient's chart and labs.  Questions were answered to the patient's satisfaction.     Sandra [name]

## 2018-03-31 ENCOUNTER — Encounter (HOSPITAL_COMMUNITY): Payer: Self-pay | Admitting: Urology

## 2018-03-31 NOTE — Addendum Note (Signed)
Addendum  created 03/31/18 0752 by Ollen Bowl, CRNA   Charge Capture section accepted

## 2018-05-04 ENCOUNTER — Ambulatory Visit (INDEPENDENT_AMBULATORY_CARE_PROVIDER_SITE_OTHER): Payer: Medicaid Other | Admitting: Urology

## 2018-05-04 DIAGNOSIS — N3281 Overactive bladder: Secondary | ICD-10-CM | POA: Diagnosis not present

## 2018-07-08 IMAGING — DX DG SHOULDER 2+V*R*
3 series · 3 of 3 positions shown · non-contrast
Comparison: None.

CLINICAL DATA: Right shoulder pain for 6 days. No known injury.
Initial encounter.

EXAM:
RIGHT SHOULDER - 2+ VIEW

[shoulder grashey]
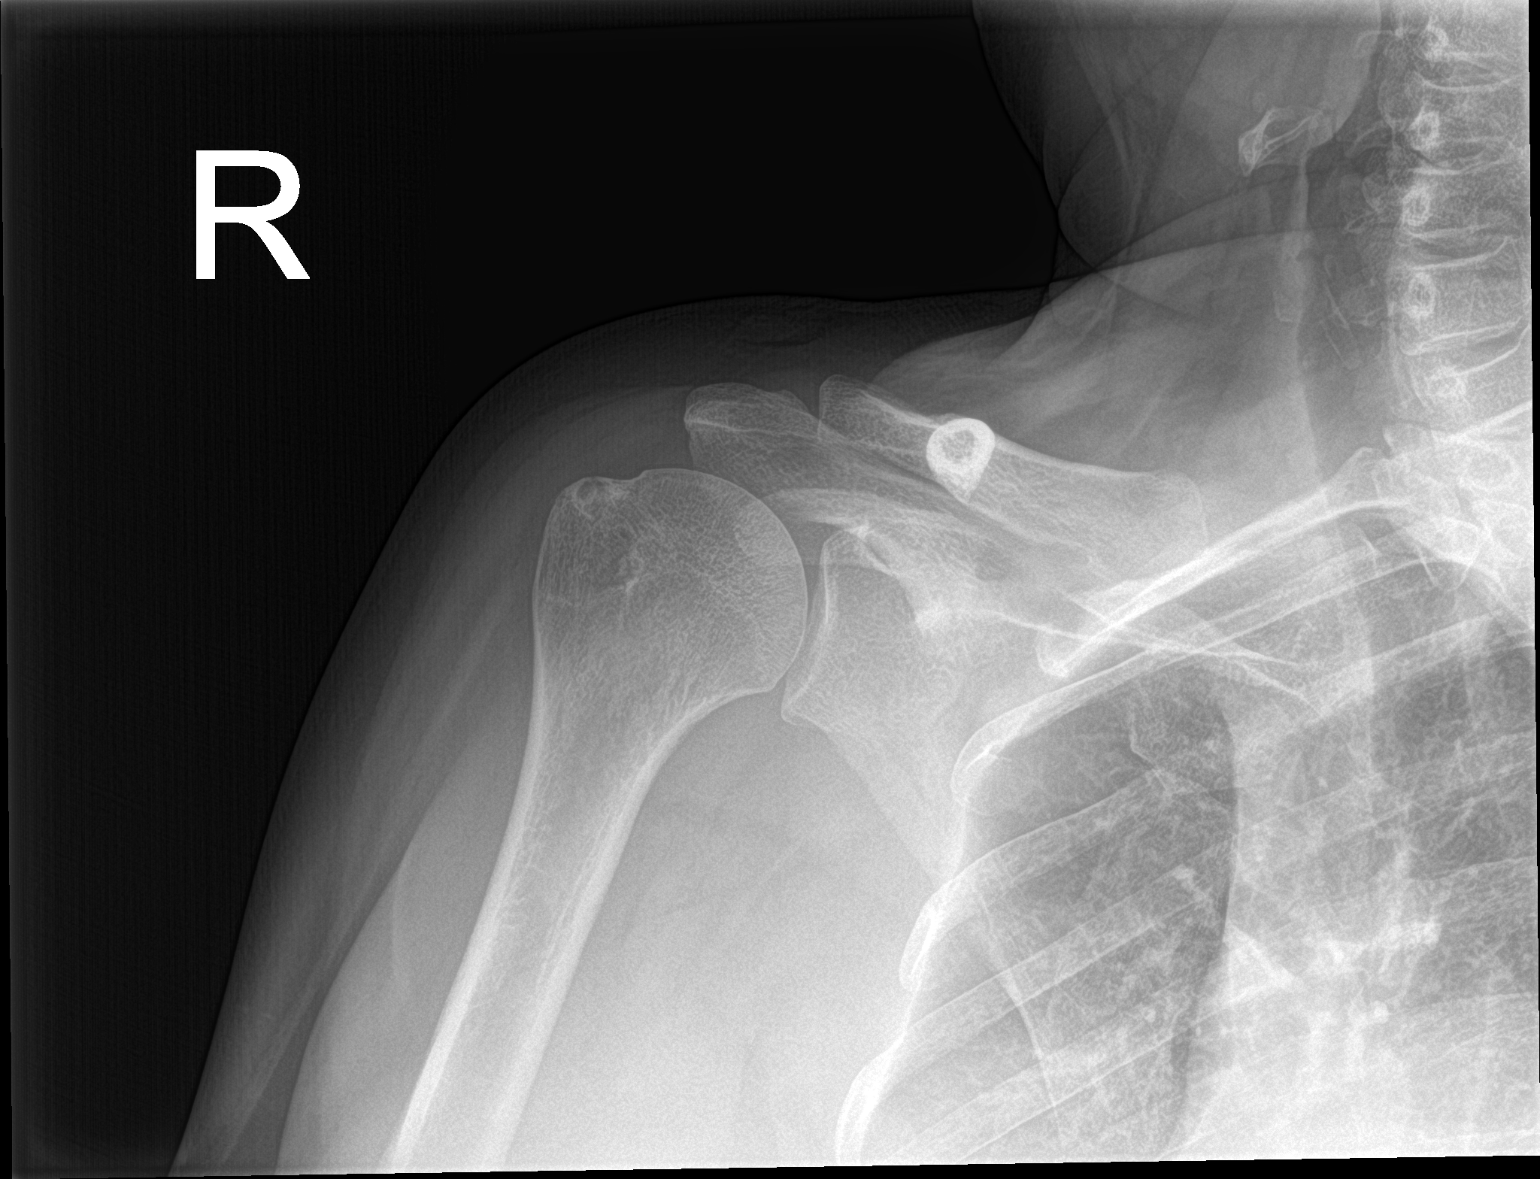

[shoulder y view]
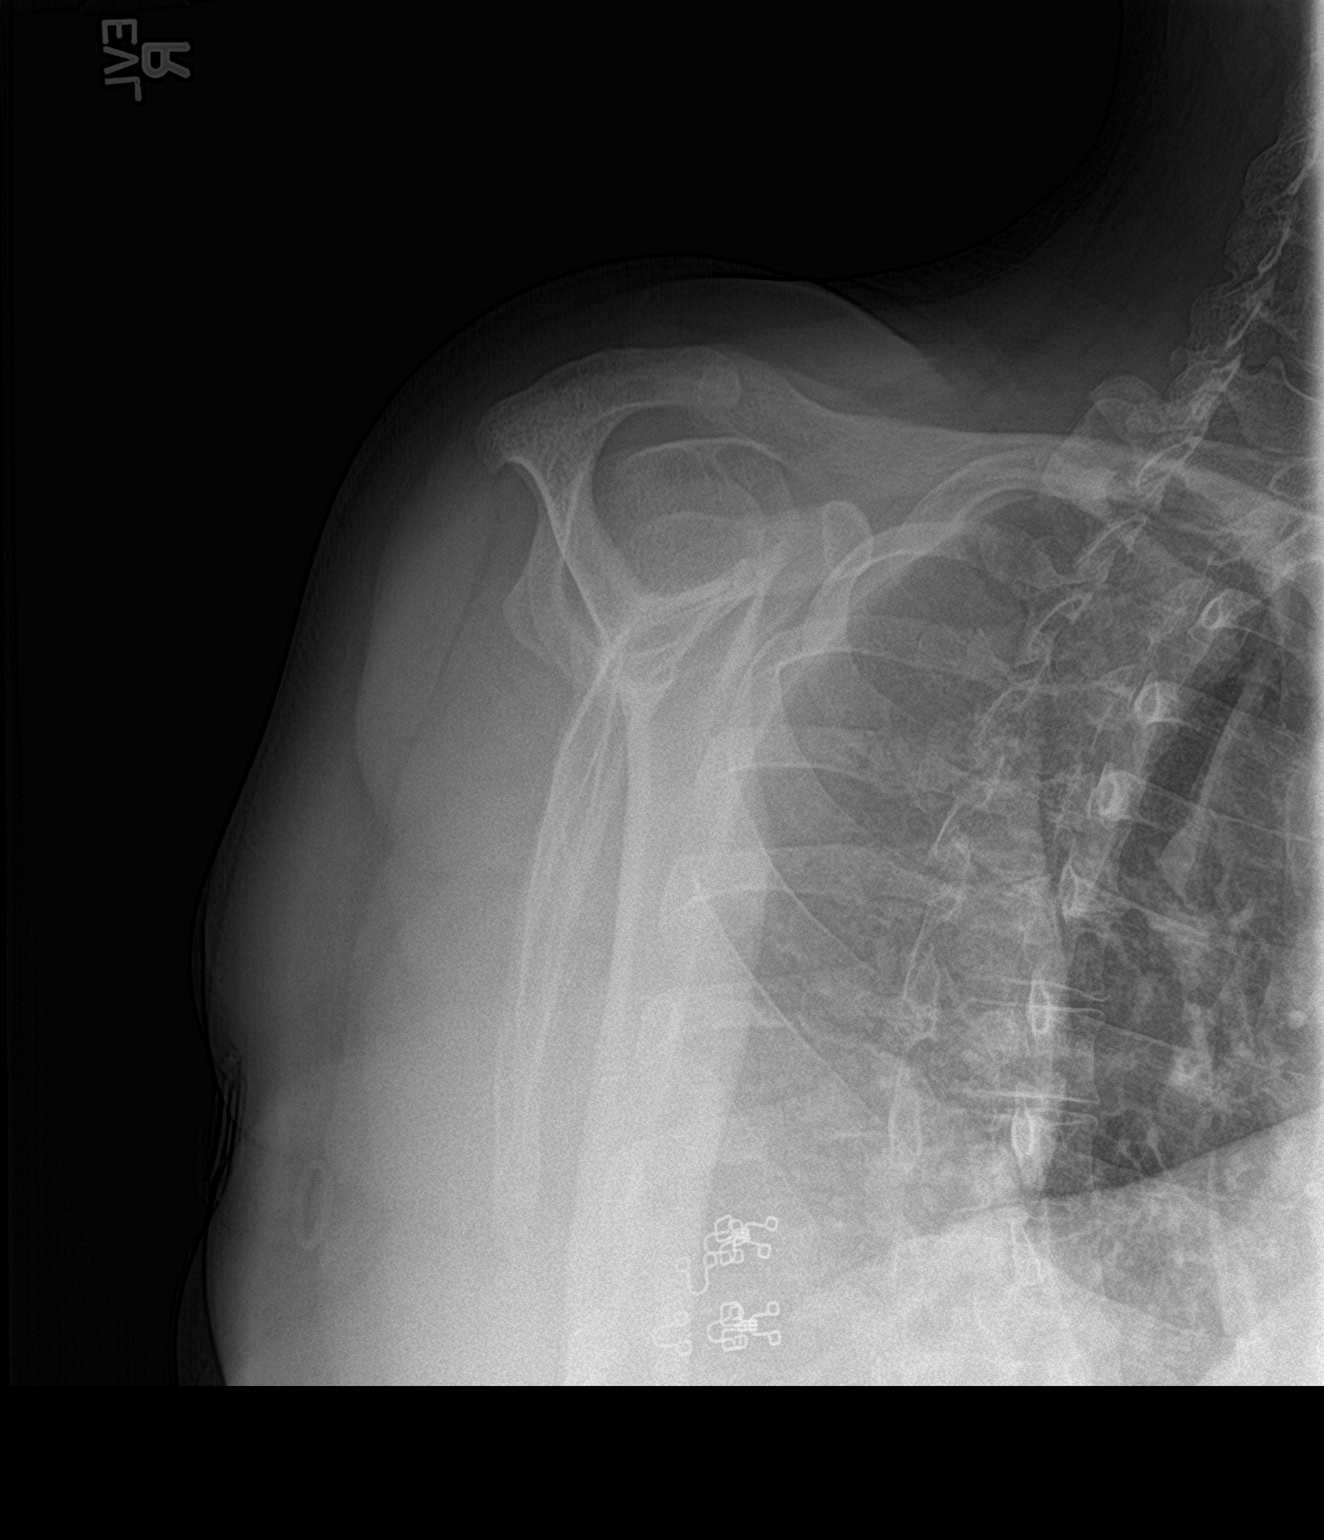

[shoulder axillary]
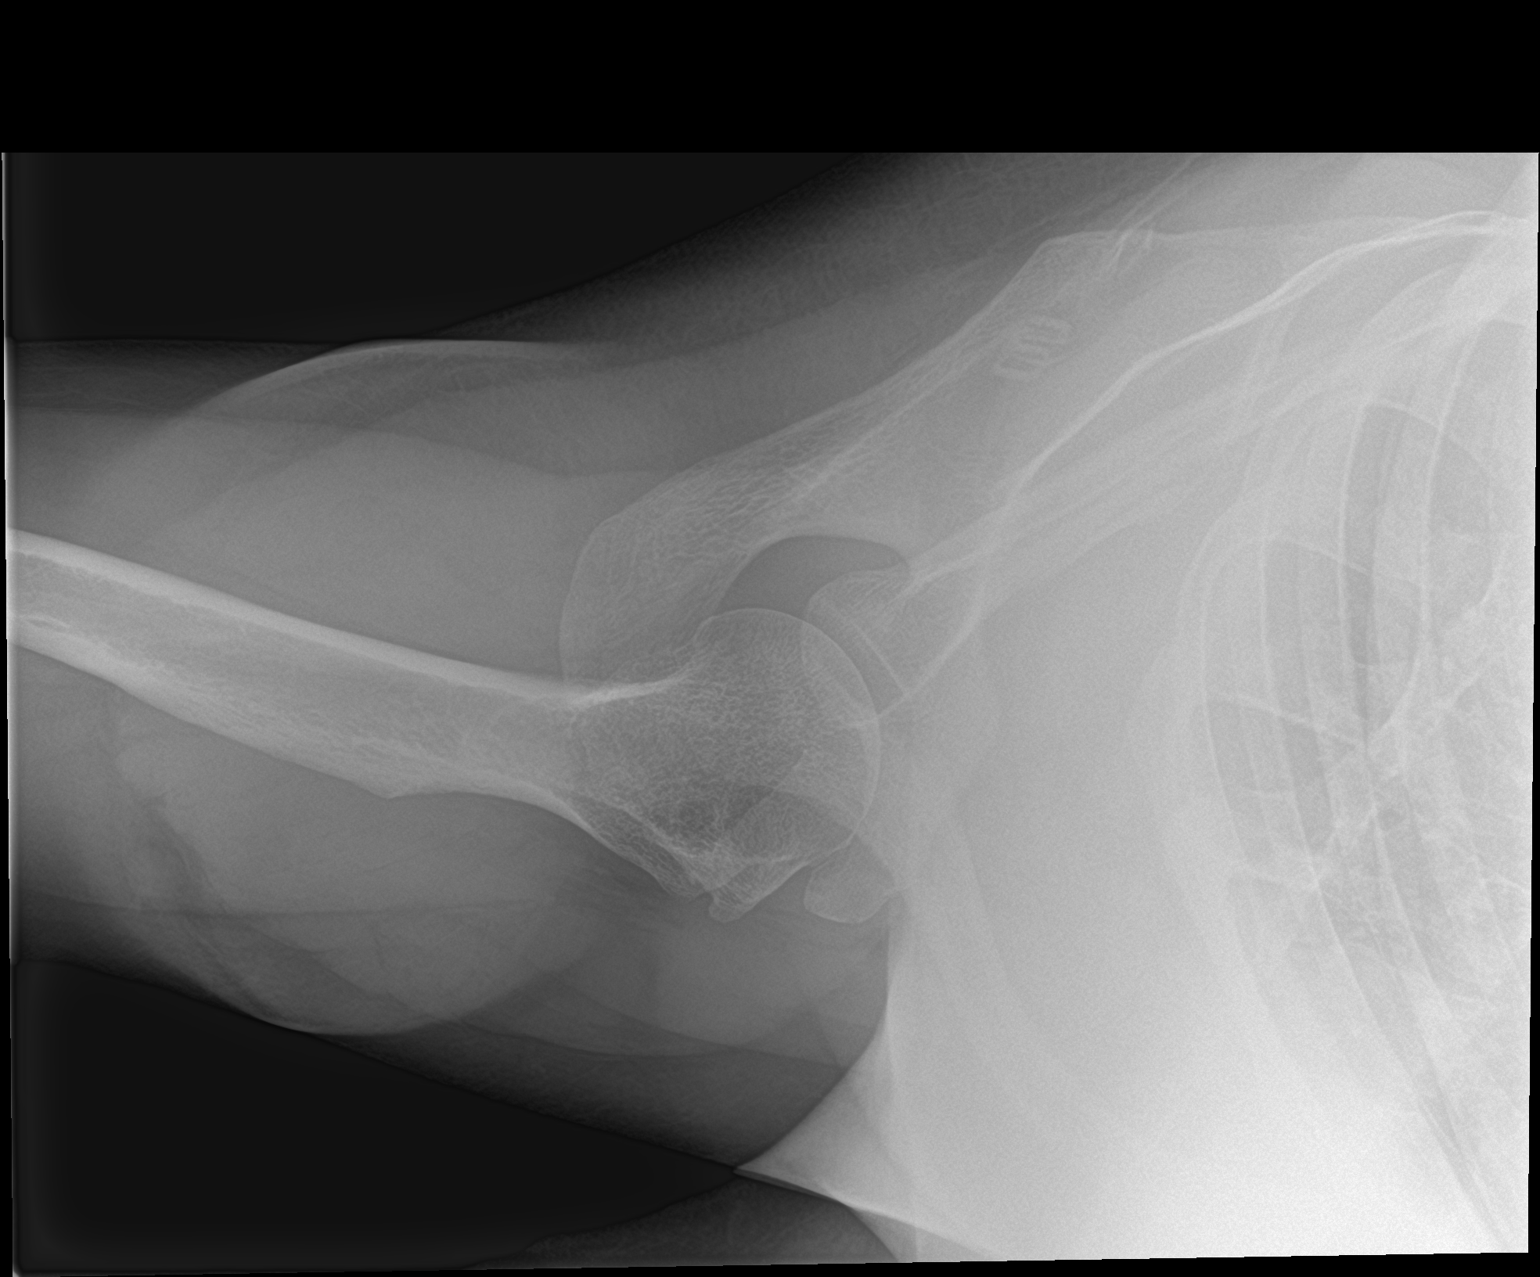

[3 of 3 positions shown; findings below may reference images not displayed]

FINDINGS: There is no evidence of fracture or dislocation. There is no
evidence of arthropathy or other focal bone abnormality. Soft
tissues are unremarkable.
IMPRESSION: Negative exam.

## 2018-08-03 ENCOUNTER — Ambulatory Visit (INDEPENDENT_AMBULATORY_CARE_PROVIDER_SITE_OTHER): Payer: Medicaid Other | Admitting: Urology

## 2018-08-03 DIAGNOSIS — C670 Malignant neoplasm of trigone of bladder: Secondary | ICD-10-CM

## 2018-09-08 ENCOUNTER — Ambulatory Visit: Payer: Medicaid Other | Admitting: "Endocrinology

## 2018-09-20 ENCOUNTER — Ambulatory Visit: Payer: Medicaid Other | Admitting: "Endocrinology

## 2018-10-10 ENCOUNTER — Ambulatory Visit (INDEPENDENT_AMBULATORY_CARE_PROVIDER_SITE_OTHER): Payer: Medicaid Other | Admitting: "Endocrinology

## 2018-10-10 ENCOUNTER — Other Ambulatory Visit: Payer: Self-pay

## 2018-10-10 ENCOUNTER — Encounter: Payer: Self-pay | Admitting: "Endocrinology

## 2018-10-10 VITALS — BP 142/78 | HR 71 | Ht 59.0 in | Wt 151.0 lb

## 2018-10-10 DIAGNOSIS — E1165 Type 2 diabetes mellitus with hyperglycemia: Secondary | ICD-10-CM | POA: Diagnosis not present

## 2018-10-10 DIAGNOSIS — E782 Mixed hyperlipidemia: Secondary | ICD-10-CM | POA: Insufficient documentation

## 2018-10-10 LAB — POCT GLYCOSYLATED HEMOGLOBIN (HGB A1C): Hemoglobin A1C: 11.2 % — AB (ref 4.0–5.6)

## 2018-10-10 MED ORDER — ACCU-CHEK GUIDE W/DEVICE KIT: 1.0000 | PACK | 0 refills | Status: DC

## 2018-10-10 MED ORDER — GLIPIZIDE ER 5 MG PO TB24: 5.0000 mg | ORAL_TABLET | Freq: Every day | ORAL | 3 refills | Status: DC

## 2018-10-10 MED ORDER — ACCU-CHEK GUIDE VI STRP: ORAL_STRIP | 2 refills | Status: DC

## 2018-10-10 MED ORDER — ROSUVASTATIN CALCIUM 20 MG PO TABS: 20.0000 mg | ORAL_TABLET | Freq: Every day | ORAL | 3 refills | Status: DC

## 2018-10-10 NOTE — Progress Notes (Signed)
Endocrinology Consult Note       10/10/2018, 5:40 PM   Subjective:    Patient ID: Sandra [name], female    DOB: 02-Nov-1975.  Sandra [name] is being seen in consultation for management of currently uncontrolled symptomatic diabetes requested by  Joyice Faster, FNP.   Past Medical History:  Diagnosis Date  . ADHD (attention deficit hyperactivity disorder)   . Allergic rhinitis   . Anemia    hx of  . Bipolar 1 disorder ([place])   . Bladder tumor   . Cancer ([place])    bladder  . Dizziness   . Full dentures   . GERD (gastroesophageal reflux disease)   . Gross hematuria   . Hyperlipidemia   . Hypertension   . Type 2 diabetes mellitus ([place])   . Urgency of urination    dysuria, sui  . Wears glasses     Past Surgical History:  Procedure Laterality Date  . CYSTOSCOPY N/A 10/03/2015   Procedure: CYSTOSCOPY;  Surgeon: Irine Seal, MD;  Location: [facility];  Service: Urology;  Laterality: N/A;  . CYSTOSCOPY WITH STENT PLACEMENT Right 10/31/2015   Procedure: CYSTOSCOPY WITH ATTEMPTED RIGHT URETERAL OPENING;  Surgeon: Irine Seal, MD;  Location: [facility];  Service: Urology;  Laterality: Right;  . INTERSTIM IMPLANT PLACEMENT N/A 03/14/2018   Procedure: Barrie Lyme IMPLANT FIRST STAGE;  Surgeon: Cleon Gustin, MD;  Location: [facility];  Service: Urology;  Laterality: N/A;  . INTERSTIM IMPLANT PLACEMENT N/A 03/30/2018   Procedure: Barrie Lyme IMPLANT SECOND STAGE;  Surgeon: Cleon Gustin, MD;  Location: [facility];  Service: Urology;  Laterality: N/A;  . MULTIPLE TOOTH EXTRACTIONS  2015  . OVARIAN CYST REMOVAL Right 2004 approx  . TONSILLECTOMY  12/30/2004  . TOTAL ABDOMINAL HYSTERECTOMY W/ BILATERAL SALPINGOOPHORECTOMY  05/16/2004  . TRANSURETHRAL RESECTION OF BLADDER TUMOR N/A 10/03/2015   Procedure: TRANSURETHRAL RESECTION OF BLADDER TUMOR (TURBT);  Surgeon: Irine Seal, MD;   Location: [facility];  Service: Urology;  Laterality: N/A;  . TRANSURETHRAL RESECTION OF BLADDER TUMOR N/A 10/31/2015   Procedure: RE-STAGINGG TRANSURETHRAL RESECTION OF BLADDER TUMOR (TURBT);  Surgeon: Irine Seal, MD;  Location: [facility];  Service: Urology;  Laterality: N/A;  . TRANSURETHRAL RESECTION OF BLADDER TUMOR N/A 08/17/2016   Procedure: TRANSURETHRAL RESECTION OF BLADDER TUMOR (TURBT);  Surgeon: Cleon Gustin, MD;  Location: [facility];  Service: Urology;  Laterality: N/A;    Social History   Socioeconomic History  . Marital status: Legally Separated    Spouse name: Not on file  . Number of children: Not on file  . Years of education: Not on file  . Highest education level: Not on file  Occupational History  . Not on file  Social Needs  . Financial resource strain: Not on file  . Food insecurity    Worry: Not on file    Inability: Not on file  . Transportation needs    Medical: Not on file    Non-medical: Not on file  Tobacco Use  . Smoking status: Current Every Day Smoker    Packs/day: 0.50  Types: Cigarettes  . Smokeless tobacco: Never Used  Substance and Sexual Activity  . Alcohol use: No  . Drug use: No  . Sexual activity: Not Currently    Birth control/protection: Surgical    Comment: hyst  Lifestyle  . Physical activity    Days per week: Not on file    Minutes per session: Not on file  . Stress: Not on file  Relationships  . Social Herbalist on phone: Not on file    Gets together: Not on file    Attends religious service: Not on file    Active member of club or organization: Not on file    Attends meetings of clubs or organizations: Not on file    Relationship status: Not on file  Other Topics Concern  . Not on file  Social History Narrative  . Not on file    Family History  Problem Relation Age of Onset  . Hypertension Mother   . Cancer Mother   . Breast cancer Maternal Aunt   . Lung cancer  Maternal Uncle   . Cancer Other   . Diabetes Daughter     Outpatient Encounter Medications as of 10/10/2018  Medication Sig  . FLUoxetine (PROZAC) 20 MG capsule Take 40 mg by mouth at bedtime.  . Insulin Glargine (LANTUS SOLOSTAR) 100 UNIT/ML Solostar Pen Inject 1 Units into the skin daily.  . metFORMIN (GLUCOPHAGE) 500 MG tablet Take 1,000 mg by mouth 2 (two) times daily with a meal.   . pantoprazole (PROTONIX) 40 MG tablet Take 40 mg by mouth at bedtime.   Marland Kitchen aspirin-acetaminophen-caffeine (EXCEDRIN MIGRAINE) 250-250-65 MG tablet Take 2 tablets by mouth 3 (three) times daily as needed for migraine.   . Blood Glucose Monitoring Suppl (ACCU-CHEK GUIDE) w/Device KIT 1 Piece by Does not apply route as directed.  Marland Kitchen glipiZIDE (GLUCOTROL XL) 5 MG 24 hr tablet Take 1 tablet (5 mg total) by mouth daily with breakfast.  . glucose blood (ACCU-CHEK GUIDE) test strip Use as instructed  . rosuvastatin (CRESTOR) 20 MG tablet Take 1 tablet (20 mg total) by mouth daily.  . [DISCONTINUED] amphetamine-dextroamphetamine (ADDERALL) 30 MG tablet Take 30 mg by mouth daily at 3 pm. In the afternoon.  . [DISCONTINUED] cetirizine (ZYRTEC) 10 MG tablet Take 10 mg by mouth daily.   . [DISCONTINUED] cloNIDine (CATAPRES) 0.1 MG tablet Take 0.1 mg by mouth 2 (two) times daily.  . [DISCONTINUED] EQUETRO 300 MG CP12 Take 1 capsule (300 mg total) by mouth 2 (two) times daily.  . [DISCONTINUED] fluticasone (FLONASE) 50 MCG/ACT nasal spray Place 1-2 sprays into both nostrils daily as needed for allergies.   . [DISCONTINUED] glipiZIDE (GLUCOTROL XL) 5 MG 24 hr tablet Take 5 mg by mouth every evening.  . [DISCONTINUED] HYDROcodone-acetaminophen (NORCO/VICODIN) 5-325 MG tablet Take 2 tablets by mouth every 6 (six) hours as needed for moderate pain.  . [DISCONTINUED] ketoconazole (NIZORAL) 2 % cream Apply 1 application topically daily. Apply to irritated skin when dry.5x/wk begin after completion of mycolog. (Patient taking  differently: Apply 1 application topically daily as needed (yeast/skin irritation.). Apply to irritated skin when dry.5x/wk begin after completion of mycolog.)  . [DISCONTINUED] levocetirizine (XYZAL) 5 MG tablet Take 5 mg by mouth every evening.  . [DISCONTINUED] lisdexamfetamine (VYVANSE) 70 MG capsule Take 70 mg by mouth daily.  . [DISCONTINUED] montelukast (SINGULAIR) 10 MG tablet Take 10 mg by mouth at bedtime.   . [DISCONTINUED] PROAIR HFA 108 (90 Base) MCG/ACT inhaler  Inhale 1-2 puffs into the lungs every 4 (four) hours as needed for shortness of breath.   Facility-Administered Encounter Medications as of 10/10/2018  Medication  . betamethasone acetate-betamethasone sodium phosphate (CELESTONE) injection 3 mg    ALLERGIES: Allergies  Allergen Reactions  . Benazepril Hives  . Ibuprofen Hives    itching  . Metronidazole Nausea And Vomiting  . Oxycodone     Pt has tolerated hydromorphone in the past and takes Norco at home.  . Adhesive [Tape] Rash  . Codeine Nausea And Vomiting and Rash  . Loratadine Rash  . Nystatin-Triamcinolone Rash  . Penicillins Rash    Has patient had a PCN reaction causing immediate rash, facial/tongue/throat swelling, SOB or lightheadedness with hypotension: No Has patient had a PCN reaction causing severe rash involving mucus membranes or skin necrosis: Yes Has patient had a PCN reaction that required hospitalization: No Has patient had a PCN reaction occurring within the last 10 years: no  If all of the above answers are "NO", then may proceed with Cephalosporin use.    VACCINATION STATUS: Immunization History  Administered Date(s) Administered  . Influenza,inj,Quad PF,6+ Mos 11/01/2015    Diabetes She presents for her initial diabetic visit. She has type 2 diabetes mellitus. Onset time: She was diagnosed at approximate age of 27 years with A1c of greater than 15% Her disease course has been improving (She was initiated on Lantus, metformin.  She  admits that she has not been consistent taking her Lantus, however she has seen improvement in her glycemic profile.). Pertinent negatives for hypoglycemia include no confusion, headaches, pallor or seizures. Associated symptoms include blurred vision, fatigue, polydipsia and polyuria. Pertinent negatives for diabetes include no chest pain and no polyphagia. Symptoms are improving. Risk factors for coronary artery disease include dyslipidemia, diabetes mellitus, hypertension, sedentary lifestyle and tobacco exposure. Current diabetic treatments: Metformin 1000 mg p.o. twice daily. Her weight is increasing steadily. She is following a generally unhealthy diet. When asked about meal planning, she reported none. She has not had a previous visit with a dietitian. She rarely participates in exercise. (She is not monitoring blood glucose regularly.  Her A1c today in clinic was 11.2%, improving from >15%.) An ACE inhibitor/angiotensin II receptor blocker is not being taken.  Hyperlipidemia This is a chronic problem. The problem is uncontrolled. Exacerbating diseases include diabetes. Pertinent negatives include no chest pain, myalgias or shortness of breath. She is currently on no antihyperlipidemic treatment. Risk factors for coronary artery disease include diabetes mellitus, dyslipidemia, a sedentary lifestyle and family history.  Hypertension This is a chronic problem. The problem is uncontrolled. Associated symptoms include blurred vision. Pertinent negatives include no chest pain, headaches, palpitations or shortness of breath. Risk factors for coronary artery disease include dyslipidemia and diabetes mellitus. Past treatments include nothing.     Review of Systems  Constitutional: Positive for fatigue. Negative for chills, fever and unexpected weight change.  HENT: Negative for trouble swallowing and voice change.   Eyes: Positive for blurred vision. Negative for visual disturbance.  Respiratory: Negative  for cough, shortness of breath and wheezing.   Cardiovascular: Negative for chest pain, palpitations and leg swelling.  Gastrointestinal: Negative for diarrhea, nausea and vomiting.  Endocrine: Positive for polydipsia and polyuria. Negative for cold intolerance, heat intolerance and polyphagia.  Musculoskeletal: Negative for arthralgias and myalgias.  Skin: Negative for color change, pallor, rash and wound.  Neurological: Negative for seizures and headaches.  Psychiatric/Behavioral: Negative for confusion and suicidal ideas.    Objective:  BP (!) 142/78   Pulse 71   Ht _0  (1.499 m)   Wt 151 lb (68.5 kg)   BMI 30.50 kg/m   Wt Readings from Last 3 Encounters:  10/10/18 151 lb (68.5 kg)  03/14/18 144 lb (65.3 kg)  02/24/18 144 lb (65.3 kg)     Physical Exam Constitutional:      Appearance: She is well-developed.  HENT:     Head: Normocephalic and atraumatic.     Comments: Periorbital xanthelasma  Neck:     Musculoskeletal: Normal range of motion and neck supple.     Thyroid: No thyromegaly.     Trachea: No tracheal deviation.  Cardiovascular:     Rate and Rhythm: Normal rate.  Pulmonary:     Effort: Pulmonary effort is normal.  Abdominal:     Tenderness: There is no abdominal tenderness. There is no guarding.  Musculoskeletal: Normal range of motion.  Skin:    General: Skin is warm and dry.     Coloration: Skin is not pale.     Findings: No erythema or rash.  Neurological:     Mental Status: She is alert and oriented to person, place, and time.     Cranial Nerves: No cranial nerve deficit.     Coordination: Coordination normal.     Deep Tendon Reflexes: Reflexes are normal and symmetric.  Psychiatric:        Judgment: Judgment normal.      CMP ( most recent) CMP     Component Value Date/Time   NA 132 (L) 02/24/2018 1923   K 4.0 02/24/2018 1923   CL 101 02/24/2018 1923   CO2 24 02/24/2018 1923   GLUCOSE 316 (H) 02/24/2018 1923   BUN 9 02/24/2018  1923   BUN 9 02/24/2018   CREATININE 0.49 02/24/2018 1923   CALCIUM 8.6 (L) 02/24/2018 1923   PROT 6.8 02/24/2018 1923   ALBUMIN 3.4 (L) 02/24/2018 1923   AST 11 (L) 02/24/2018 1923   ALT 16 02/24/2018 1923   ALKPHOS 127 (H) 02/24/2018 1923   BILITOT 0.4 02/24/2018 1923   GFRNONAA >60 02/24/2018 1923   GFRAA >60 02/24/2018 1923     Diabetic Labs (most recent): Lab Results  Component Value Date   HGBA1C 11.2 (A) 10/10/2018   HGBA1C 15 02/24/2018       Assessment & Plan:   1. Uncontrolled type 2 diabetes mellitus with hyperglycemia ([place])  - Sandra [name] has currently uncontrolled symptomatic type 2 DM since  43 years of age,  with most recent A1c of 11.2 %, improving from greater than 15%. Recent labs reviewed. - I had a long discussion with her about the progressive nature of diabetes and the pathology behind its complications. -her diabetes is complicated by chronic smoking and she remains at a high risk for more acute and chronic complications which include CAD, CVA, CKD, retinopathy, and neuropathy. These are all discussed in detail with her.  - I have counseled her on diet  and weight management  by adopting a carbohydrate restricted/protein rich diet. Patient is encouraged to switch to  unprocessed or minimally processed     complex starch and increased protein intake (animal or plant source), fruits, and vegetables. -  she is advised to stick to a routine mealtimes to eat 3 meals  a day and avoid unnecessary snacks ( to snack only to correct hypoglycemia).   - she admits that there is a room for improvement in her food and drink  choices. - Suggestion is made for her to avoid simple carbohydrates  from her diet including Cakes, Sweet Desserts, Ice Cream, Soda (diet and regular), Sweet Tea, Candies, Chips, Cookies, Store Bought Juices, Alcohol in Excess of  1-2 drinks a day, Artificial Sweeteners,  Coffee Creamer, and "Sugar-free" Products. This will help patient to have  more stable blood glucose profile and potentially avoid unintended weight gain.  - she will be scheduled with Jearld Fenton, RDN, CDE for diabetes education.  - I have approached her with the following individualized plan to manage  her diabetes and patient agrees:   - she is an immediate candidate for insulin treatment, however she hesitates to continue on her insulin treatment.   -She is however willing to initiate strict monitoring of blood glucose 4 times a day-  before meals and at bedtime.  - she is encouraged to call clinic for blood glucose levels less than 70 or above 300 mg /dl. - she is advised to continue metformin 1000 mg p.o. twice daily, therapeutically suitable for patient . -I discussed and added glipizide 5 mg XL p.o. at breakfast. -If she is found to have significantly above target glycemic profile, she will be reconsidered for basal insulin with Lantus.  - she is not a suitable candidate for incretin therapy.    - Specific targets for  A1c;  LDL, HDL, Triglycerides, and  Waist Circumference were discussed with the patient.  2) Blood Pressure /Hypertension:  her blood pressure is  uncontrolled to target.   she is not on blood pressure medications, will be considered for low-dose ACE inhibitors.     3) Lipids/Hyperlipidemia: No recent lipid panel to review.  Given her periorbital xanthelasma, she is approached for statin treatment.  Reportedly, she was treated with Crestor in the past.  I discussed initiated Crestor 20 mg p.o. nightly.      Side effects and precautions discussed with her.  4)  Weight/Diet:  Body mass index is 30.5 kg/m.  -   clearly complicating her diabetes care.   she is  a candidate for weight loss. I discussed with her the fact that loss of 5 - 10% of her  current body weight will have the most impact on her diabetes management.  Exercise, and detailed carbohydrates information provided  -  detailed on discharge instructions.  5) Chronic Care/Health  Maintenance:  -she  Is not on ACEI/ARB and Statin medications and  is encouraged to initiate and continue to follow up with Ophthalmology, Dentist,  Podiatrist at least yearly or according to recommendations, and advised to  Quit smoking. I have recommended yearly flu vaccine and pneumonia vaccine at least every 5 years; moderate intensity exercise for up to 150 minutes weekly; and  sleep for at least 7 hours a day.  - she is  advised to maintain close follow up with Joyice Faster, FNP for primary care needs, as well as her other providers for optimal and coordinated care.  - Time spent with the patient: 45 minutes, of which >50% was spent in obtaining information about her symptoms, reviewing her previous labs/studies, evaluations, and treatments, counseling her about her currently uncontrolled type 2 diabetes, hyperlipidemia, hypertension, and developing plans for long term treatment based on the latest standards of care/guidelines.  Please refer to " Patient Self Inventory" in the Media  tab for reviewed elements of pertinent patient history.  Sandra [name] participated in the discussions, expressed understanding, and voiced agreement with the above plans.  All  questions were answered to her satisfaction. she is encouraged to contact clinic should she have any questions or concerns prior to her return visit.  Follow up plan: - Return in about 10 days (around 10/20/2018), or phone, for Follow up with Meter and Logs Only - no Labs.  Glade Lloyd, MD [facility] [address] [place], [place] [address] Phone: [phone]  Fax: [phone]    10/10/2018, 5:40 PM  This note was partially dictated with voice recognition software. Similar sounding words can be transcribed inadequately or may not  be corrected upon review.

## 2018-10-10 NOTE — Patient Instructions (Signed)

## 2018-10-20 ENCOUNTER — Ambulatory Visit: Payer: Medicaid Other | Admitting: "Endocrinology

## 2018-11-03 ENCOUNTER — Encounter: Payer: Self-pay | Admitting: "Endocrinology

## 2018-11-03 ENCOUNTER — Other Ambulatory Visit: Payer: Self-pay

## 2018-11-03 ENCOUNTER — Ambulatory Visit (INDEPENDENT_AMBULATORY_CARE_PROVIDER_SITE_OTHER): Payer: Medicaid Other | Admitting: "Endocrinology

## 2018-11-03 DIAGNOSIS — E782 Mixed hyperlipidemia: Secondary | ICD-10-CM

## 2018-11-03 DIAGNOSIS — E1165 Type 2 diabetes mellitus with hyperglycemia: Secondary | ICD-10-CM | POA: Diagnosis not present

## 2018-11-03 MED ORDER — METFORMIN HCL 1000 MG PO TABS: 1000.0000 mg | ORAL_TABLET | Freq: Two times a day (BID) | ORAL | 3 refills | Status: DC

## 2018-11-03 NOTE — Progress Notes (Signed)
11/03/2018, 11:39 AM                                                    Endocrinology Telehealth Visit Follow up Note -During COVID -19 Pandemic  This visit type was conducted due to national recommendations for restrictions regarding the COVID-19 Pandemic  in an effort to limit this patient's exposure and mitigate transmission of the corona virus.  Due to her co-morbid illnesses, Sandra [name] is at  moderate to high risk for complications without adequate follow up.  This format is felt to be most appropriate for her at this time.  I connected with this patient on 11/03/2018   by telephone and verified that I am speaking with the correct person using two identifiers. Sandra [name], May 19, 1975. she has verbally consented to this visit. All issues noted in this document were discussed and addressed. The format was not optimal for physical exam.    Subjective:    Patient ID: Sandra [name], female    DOB: 10/22/1975.  Sandra [name] is being engaged in telehealth via telephone in follow-up after she was seen in consultation for management of currently uncontrolled symptomatic type 2  diabetes requested by  Joyice Faster, FNP.   Past Medical History:  Diagnosis Date  . ADHD (attention deficit hyperactivity disorder)   . Allergic rhinitis   . Anemia    hx of  . Bipolar 1 disorder ([place])   . Bladder tumor   . Cancer ([place])    bladder  . Dizziness   . Full dentures   . GERD (gastroesophageal reflux disease)   . Gross hematuria   . Hyperlipidemia   . Hypertension   . Type 2 diabetes mellitus ([place])   . Urgency of urination    dysuria, sui  . Wears glasses     Past Surgical History:  Procedure Laterality Date  . CYSTOSCOPY N/A 10/03/2015   Procedure: CYSTOSCOPY;  Surgeon: Irine Seal, MD;  Location: [facility];  Service: Urology;  Laterality: N/A;  . CYSTOSCOPY WITH STENT PLACEMENT Right  10/31/2015   Procedure: CYSTOSCOPY WITH ATTEMPTED RIGHT URETERAL OPENING;  Surgeon: Irine Seal, MD;  Location: [facility];  Service: Urology;  Laterality: Right;  . INTERSTIM IMPLANT PLACEMENT N/A 03/14/2018   Procedure: Barrie Lyme IMPLANT FIRST STAGE;  Surgeon: Cleon Gustin, MD;  Location: [facility];  Service: Urology;  Laterality: N/A;  . INTERSTIM IMPLANT PLACEMENT N/A 03/30/2018   Procedure: Barrie Lyme IMPLANT SECOND STAGE;  Surgeon: Cleon Gustin, MD;  Location: [facility];  Service: Urology;  Laterality: N/A;  . MULTIPLE TOOTH EXTRACTIONS  2015  . OVARIAN CYST REMOVAL Right 2004 approx  . TONSILLECTOMY  12/30/2004  . TOTAL ABDOMINAL HYSTERECTOMY W/ BILATERAL SALPINGOOPHORECTOMY  05/16/2004  . TRANSURETHRAL RESECTION OF BLADDER TUMOR N/A 10/03/2015   Procedure: TRANSURETHRAL RESECTION OF BLADDER TUMOR (TURBT);  Surgeon: Irine Seal, MD;  Location[facility];  Service: Urology;  Laterality: N/A;  . TRANSURETHRAL RESECTION OF BLADDER TUMOR  N/A 10/31/2015   Procedure: RE-STAGINGG TRANSURETHRAL RESECTION OF BLADDER TUMOR (TURBT);  Surgeon: Irine Seal, MD;  Location: [facility];  Service: Urology;  Laterality: N/A;  . TRANSURETHRAL RESECTION OF BLADDER TUMOR N/A 08/17/2016   Procedure: TRANSURETHRAL RESECTION OF BLADDER TUMOR (TURBT);  Surgeon: Cleon Gustin, MD;  Location: [facility];  Service: Urology;  Laterality: N/A;    Social History   Socioeconomic History  . Marital status: Legally Separated    Spouse name: Not on file  . Number of children: Not on file  . Years of education: Not on file  . Highest education level: Not on file  Occupational History  . Not on file  Social Needs  . Financial resource strain: Not on file  . Food insecurity    Worry: Not on file    Inability: Not on file  . Transportation needs    Medical: Not on file    Non-medical: Not on file  Tobacco Use  . Smoking status: Current Every Day Smoker     Packs/day: 0.50    Types: Cigarettes  . Smokeless tobacco: Never Used  Substance and Sexual Activity  . Alcohol use: No  . Drug use: No  . Sexual activity: Not Currently    Birth control/protection: Surgical    Comment: hyst  Lifestyle  . Physical activity    Days per week: Not on file    Minutes per session: Not on file  . Stress: Not on file  Relationships  . Social Herbalist on phone: Not on file    Gets together: Not on file    Attends religious service: Not on file    Active member of club or organization: Not on file    Attends meetings of clubs or organizations: Not on file    Relationship status: Not on file  Other Topics Concern  . Not on file  Social History Narrative  . Not on file    Family History  Problem Relation Age of Onset  . Hypertension Mother   . Cancer Mother   . Breast cancer Maternal Aunt   . Lung cancer Maternal Uncle   . Cancer Other   . Diabetes Daughter     Outpatient Encounter Medications as of 11/03/2018  Medication Sig  . aspirin-acetaminophen-caffeine (EXCEDRIN MIGRAINE) 250-250-65 MG tablet Take 2 tablets by mouth 3 (three) times daily as needed for migraine.   . Blood Glucose Monitoring Suppl (ACCU-CHEK GUIDE) w/Device KIT 1 Piece by Does not apply route as directed.  Marland Kitchen FLUoxetine (PROZAC) 20 MG capsule Take 40 mg by mouth at bedtime.  Marland Kitchen glipiZIDE (GLUCOTROL XL) 5 MG 24 hr tablet Take 1 tablet (5 mg total) by mouth daily with breakfast.  . glucose blood (ACCU-CHEK GUIDE) test strip Use as instructed  . Insulin Glargine (LANTUS SOLOSTAR) 100 UNIT/ML Solostar Pen Inject 1 Units into the skin daily.  . metFORMIN (GLUCOPHAGE) 1000 MG tablet Take 1 tablet (1,000 mg total) by mouth 2 (two) times daily with a meal.  . pantoprazole (PROTONIX) 40 MG tablet Take 40 mg by mouth at bedtime.   . rosuvastatin (CRESTOR) 20 MG tablet Take 1 tablet (20 mg total) by mouth daily.  . [DISCONTINUED] metFORMIN (GLUCOPHAGE) 500 MG tablet Take  1,000 mg by mouth 2 (two) times daily with a meal.    Facility-Administered Encounter Medications as of 11/03/2018  Medication  . betamethasone acetate-betamethasone sodium phosphate (CELESTONE) injection 3 mg    ALLERGIES: Allergies  Allergen  Reactions  . Benazepril Hives  . Ibuprofen Hives    itching  . Metronidazole Nausea And Vomiting  . Oxycodone     Pt has tolerated hydromorphone in the past and takes Norco at home.  . Adhesive [Tape] Rash  . Codeine Nausea And Vomiting and Rash  . Loratadine Rash  . Nystatin-Triamcinolone Rash  . Penicillins Rash    Has patient had a PCN reaction causing immediate rash, facial/tongue/throat swelling, SOB or lightheadedness with hypotension: No Has patient had a PCN reaction causing severe rash involving mucus membranes or skin necrosis: Yes Has patient had a PCN reaction that required hospitalization: No Has patient had a PCN reaction occurring within the last 10 years: no  If all of the above answers are "NO", then may proceed with Cephalosporin use.    VACCINATION STATUS: Immunization History  Administered Date(s) Administered  . Influenza,inj,Quad PF,6+ Mos 11/01/2015    Diabetes She presents for her follow-up diabetic visit. She has type 2 diabetes mellitus. Onset time: She was diagnosed at approximate age of 59 years with A1c of greater than 15% Her disease course has been improving (She was initiated on Lantus, metformin.  She admits that she has not been consistent taking her Lantus, however she has seen improvement in her glycemic profile.). Pertinent negatives for hypoglycemia include no confusion, headaches, pallor or seizures. Associated symptoms include blurred vision and fatigue. Pertinent negatives for diabetes include no chest pain and no polyphagia. Symptoms are improving. Risk factors for coronary artery disease include dyslipidemia, diabetes mellitus, hypertension, sedentary lifestyle and tobacco exposure. Current diabetic  treatments: Metformin 1000 mg p.o. twice daily. Her weight is fluctuating minimally. She is following a generally unhealthy diet. When asked about meal planning, she reported none. She has not had a previous visit with a dietitian. She rarely participates in exercise. (She reports significant improvement in her glycemic profile: Fasting between 141-189, prelunch between 180-197, presupper between 149-264, bedtime between 185-226.  her A1c recently in clinic was 11.2%, improving from >15%.) An ACE inhibitor/angiotensin II receptor blocker is not being taken.  Hyperlipidemia This is a chronic problem. The problem is uncontrolled. Exacerbating diseases include diabetes. Pertinent negatives include no chest pain, myalgias or shortness of breath. She is currently on no antihyperlipidemic treatment. Risk factors for coronary artery disease include diabetes mellitus, dyslipidemia, a sedentary lifestyle and family history.  Hypertension This is a chronic problem. The problem is uncontrolled. Associated symptoms include blurred vision. Pertinent negatives include no chest pain, headaches, palpitations or shortness of breath. Risk factors for coronary artery disease include dyslipidemia and diabetes mellitus. Past treatments include nothing.    Review of systems: Limited as above. Objective:    There were no vitals taken for this visit.  Wt Readings from Last 3 Encounters:  10/10/18 151 lb (68.5 kg)  03/14/18 144 lb (65.3 kg)  02/24/18 144 lb (65.3 kg)       CMP ( most recent) CMP     Component Value Date/Time   NA 132 (L) 02/24/2018 1923   K 4.0 02/24/2018 1923   CL 101 02/24/2018 1923   CO2 24 02/24/2018 1923   GLUCOSE 316 (H) 02/24/2018 1923   BUN 9 02/24/2018 1923   BUN 9 02/24/2018   CREATININE 0.49 02/24/2018 1923   CALCIUM 8.6 (L) 02/24/2018 1923   PROT 6.8 02/24/2018 1923   ALBUMIN 3.4 (L) 02/24/2018 1923   AST 11 (L) 02/24/2018 1923   ALT 16 02/24/2018 1923   ALKPHOS 127 (H)  02/24/2018  1923   BILITOT 0.4 02/24/2018 1923   GFRNONAA >60 02/24/2018 1923   GFRAA >60 02/24/2018 1923     Diabetic Labs (most recent): Lab Results  Component Value Date   HGBA1C 11.2 (A) 10/10/2018   HGBA1C 15 02/24/2018      Assessment & Plan:   1. Uncontrolled type 2 diabetes mellitus with hyperglycemia ([place])  - Sandra [name] has currently uncontrolled symptomatic type 2 DM since  43 years of age.  -She is reporting significant improvement in her glycemic profile overall between 141-297.  -Her recent A1c was 11.2% improving from greater than 15%. Recent labs reviewed. - I had a long discussion with her about the progressive nature of diabetes and the pathology behind its complications. -her diabetes is complicated by chronic smoking and she remains at a high risk for more acute and chronic complications which include CAD, CVA, CKD, retinopathy, and neuropathy. These are all discussed in detail with her.  - I have counseled her on diet  and weight management  by adopting a carbohydrate restricted/protein rich diet. Patient is encouraged to switch to  unprocessed or minimally processed     complex starch and increased protein intake (animal or plant source), fruits, and vegetables. -  she is advised to stick to a routine mealtimes to eat 3 meals  a day and avoid unnecessary snacks ( to snack only to correct hypoglycemia).   - she  admits there is a room for improvement in her diet and drink choices. -  Suggestion is made for her to avoid simple carbohydrates  from her diet including Cakes, Sweet Desserts / Pastries, Ice Cream, Soda (diet and regular), Sweet Tea, Candies, Chips, Cookies, Sweet Pastries,  Store Bought Juices, Alcohol in Excess of  1-2 drinks a day, Artificial Sweeteners, Coffee Creamer, and "Sugar-free" Products. This will help patient to have stable blood glucose profile and potentially avoid unintended weight gain.   - she will be scheduled with Jearld Fenton, RDN, CDE for diabetes education.  - I have approached her with the following individualized plan to manage  her diabetes and patient agrees:   -Based on her glycemic response to her oral medication, she would not need insulin treatment for now.     -She is advised to continue strict monitoring of blood glucose 2 times a day-  before breakfast and at bedtime  - she is encouraged to call clinic for blood glucose levels less than 70 or above 200 mg /dl. - she is advised to continue metformin 1000 mg p.o. twice daily, therapeutically suitable for patient . -She has benefited from low-dose glipizide therapy.  She is advised to continue  glipizide 5 mg XL p.o. at breakfast. -If she is found to have significantly above target glycemic profile, she will be reconsidered for basal insulin with Lantus.  - she is not a suitable candidate for incretin therapy, she will need fasting lipid profile on subsequent visits.  - Specific targets for  A1c;  LDL, HDL, Triglycerides, and  Waist Circumference were discussed with the patient.  2) Blood Pressure /Hypertension: she is advised to home monitor blood pressure and report if > 140/90 on 2 separate readings.   she is not on blood pressure medications, will be considered for low-dose ACE inhibitors.     3) Lipids/Hyperlipidemia: No recent lipid panel to review.  Given her periorbital xanthelasma, she is advised to continue Crestor 20 mg p.o. nightly.      Side effects and precautions discussed  with her.  4)  Weight/Diet: She has a BMI of 30.5-      she is  a candidate for weight loss. I discussed with her the fact that loss of 5 - 10% of her  current body weight will have the most impact on her diabetes management.  Exercise, and detailed carbohydrates information provided  -  detailed on discharge instructions.  5) Chronic Care/Health Maintenance:  -she  Is not on ACEI/ARB and Statin medications and  is encouraged to initiate and continue to  follow up with Ophthalmology, Dentist,  Podiatrist at least yearly or according to recommendations, and advised to  Quit smoking. I have recommended yearly flu vaccine and pneumonia vaccine at least every 5 years; moderate intensity exercise for up to 150 minutes weekly; and  sleep for at least 7 hours a day.  - she is  advised to maintain close follow up with Joyice Faster, FNP for primary care needs, as well as her other providers for optimal and coordinated care.  - Patient Care Time Today:  25 min, of which >50% was spent in  counseling and the rest reviewing her  current and  previous labs/studies, previous treatments, her blood glucose readings, and medications' doses and developing a plan for long-term care based on the latest recommendations for standards of care.   Sandra [name] participated in the discussions, expressed understanding, and voiced agreement with the above plans.  All questions were answered to her satisfaction. she is encouraged to contact clinic should she have any questions or concerns prior to her return visit.   Follow up plan: - Return in about 3 months (around 02/03/2019) for Bring Meter and Logs- A1c in Office, Include 8 log sheets.  Glade Lloyd, MD [facility] [address] [place], [place] [address] Phone: [phone]  Fax: [phone]    11/03/2018, 11:39 AM  This note was partially dictated with voice recognition software. Similar sounding words can be transcribed inadequately or may not  be corrected upon review.

## 2018-11-10 ENCOUNTER — Ambulatory Visit: Payer: Medicaid Other | Admitting: Nutrition

## 2018-11-20 DIAGNOSIS — M25512 Pain in left shoulder: Secondary | ICD-10-CM | POA: Diagnosis present

## 2018-11-20 DIAGNOSIS — F17210 Nicotine dependence, cigarettes, uncomplicated: Secondary | ICD-10-CM | POA: Insufficient documentation

## 2018-11-20 DIAGNOSIS — I10 Essential (primary) hypertension: Secondary | ICD-10-CM | POA: Diagnosis not present

## 2018-11-20 DIAGNOSIS — M7532 Calcific tendinitis of left shoulder: Secondary | ICD-10-CM | POA: Diagnosis not present

## 2018-11-20 DIAGNOSIS — Z79899 Other long term (current) drug therapy: Secondary | ICD-10-CM | POA: Insufficient documentation

## 2018-11-20 DIAGNOSIS — Z794 Long term (current) use of insulin: Secondary | ICD-10-CM | POA: Insufficient documentation

## 2018-11-20 DIAGNOSIS — E119 Type 2 diabetes mellitus without complications: Secondary | ICD-10-CM | POA: Diagnosis not present

## 2018-11-20 DIAGNOSIS — Z8551 Personal history of malignant neoplasm of bladder: Secondary | ICD-10-CM | POA: Insufficient documentation

## 2018-11-21 ENCOUNTER — Encounter (HOSPITAL_COMMUNITY): Payer: Self-pay | Admitting: Emergency Medicine

## 2018-11-21 ENCOUNTER — Other Ambulatory Visit: Payer: Self-pay

## 2018-11-21 ENCOUNTER — Emergency Department (HOSPITAL_COMMUNITY): Payer: Medicaid Other

## 2018-11-21 ENCOUNTER — Emergency Department (HOSPITAL_COMMUNITY)
Admission: EM | Admit: 2018-11-21 | Discharge: 2018-11-21 | Disposition: A | Discharge status: Home / Self Care | Payer: Medicaid Other | Attending: Emergency Medicine | Admitting: Emergency Medicine

## 2018-11-21 DIAGNOSIS — M778 Other enthesopathies, not elsewhere classified: Secondary | ICD-10-CM

## 2018-11-21 LAB — GLUCOSE, CAPILLARY: Glucose-Capillary: 280 mg/dL — ABNORMAL HIGH (ref 70–99)

## 2018-11-21 MED ORDER — DEXAMETHASONE SODIUM PHOSPHATE 4 MG/ML IJ SOLN
4.0000 mg | Freq: Once | INTRAMUSCULAR | Status: AC
  Administered 2018-11-21: 4 mg via INTRAMUSCULAR
  Filled 2018-11-21: qty 1

## 2018-11-21 MED ORDER — CYCLOBENZAPRINE HCL 10 MG PO TABS
5.0000 mg | ORAL_TABLET | Freq: Once | ORAL | Status: AC
  Administered 2018-11-21: 5 mg via ORAL
  Filled 2018-11-21: qty 1

## 2018-11-21 MED ORDER — PREDNISONE 20 MG PO TABS: ORAL_TABLET | ORAL | 0 refills | Status: DC

## 2018-11-21 MED ORDER — CYCLOBENZAPRINE HCL 5 MG PO TABS: 5.0000 mg | ORAL_TABLET | Freq: Three times a day (TID) | ORAL | 0 refills | Status: DC | PRN

## 2018-11-21 NOTE — Discharge Instructions (Addendum)
Use ice and heat for comfort.  Take the steroids and muscle relaxer as prescribed.  Please call Dr. Ruthe Mannan office, he is the orthopedist in [place], to have him recheck and evaluate your shoulder pain.  Please be very careful with your diabetic diet while you are on the prednisone, it can make your blood sugar get high.

## 2018-11-21 NOTE — ED Provider Notes (Signed)
[facility] EMERGENCY DEPARTMENT Provider Note   CSN: [id number] Arrival date & time: 11/20/18  2355   Time seen 4:20 AM  History   Chief Complaint Chief Complaint  Patient presents with  . Shoulder Pain    HPI Sandra [name] is a 43 y.o. female.     HPI patient states about 3 weeks ago she started having pain in her left shoulder/upper arm.  She denies any known injury.  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  She states any type of movement makes it worse.  She denies any neck pain at all.  She states sometimes the pain radiates down into her fingers.  She denies any numbness in her fingers.  Patient is left-handed.  She has a history of diabetes and high cholesterol.  She smokes a pack a day.  She states she does not have an orthopedist.  She states her diabetes used to be hard control but she has been able to manage it better lately.  PCP Joyice Faster, FNP   Past Medical History:  Diagnosis Date  . ADHD (attention deficit hyperactivity disorder)   . Allergic rhinitis   . Anemia    hx of  . Bipolar 1 disorder ([place])   . Bladder tumor   . Cancer ([place])    bladder  . Dizziness   . Full dentures   . GERD (gastroesophageal reflux disease)   . Gross hematuria   . Hyperlipidemia   . Hypertension   . Type 2 diabetes mellitus ([place])   . Urgency of urination    dysuria, sui  . Wears glasses     Patient Active Problem List   Diagnosis Date Noted  . Uncontrolled type 2 diabetes mellitus with hyperglycemia ([place]) 10/10/2018  . Mixed hyperlipidemia 10/10/2018  . GAD (generalized anxiety disorder) 12/17/2016  . MDD (major depressive disorder), recurrent episode, moderate ([place]) 12/17/2016  . Attention deficit hyperactivity disorder (ADHD) 12/17/2016  . Postoperative anemia due to acute blood loss 11/04/2015  . Bladder cancer ([place]) 10/31/2015  . CARPAL TUNNEL SYNDROME 10/02/2009    Past  Surgical History:  Procedure Laterality Date  . CYSTOSCOPY N/A 10/03/2015   Procedure: CYSTOSCOPY;  Surgeon: Irine Seal, MD;  Location: [facility];  Service: Urology;  Laterality: N/A;  . CYSTOSCOPY WITH STENT PLACEMENT Right 10/31/2015   Procedure: CYSTOSCOPY WITH ATTEMPTED RIGHT URETERAL OPENING;  Surgeon: Irine Seal, MD;  Location: [facility];  Service: Urology;  Laterality: Right;  . INTERSTIM IMPLANT PLACEMENT N/A 03/14/2018   Procedure: Barrie Lyme IMPLANT FIRST STAGE;  Surgeon: Cleon Gustin, MD;  Location: [facility];  Service: Urology;  Laterality: N/A;  . INTERSTIM IMPLANT PLACEMENT N/A 03/30/2018   Procedure: Barrie Lyme IMPLANT SECOND STAGE;  Surgeon: Cleon Gustin, MD;  Location: [facility];  Service: Urology;  Laterality: N/A;  . MULTIPLE TOOTH EXTRACTIONS  2015  . OVARIAN CYST REMOVAL Right 2004 approx  . TONSILLECTOMY  12/30/2004  . TOTAL ABDOMINAL HYSTERECTOMY W/ BILATERAL SALPINGOOPHORECTOMY  05/16/2004  . TRANSURETHRAL RESECTION OF BLADDER TUMOR N/A 10/03/2015   Procedure: TRANSURETHRAL RESECTION OF BLADDER TUMOR (TURBT);  Surgeon: Irine Seal, MD;  Location: [facility];  Service: Urology;  Laterality: N/A;  . TRANSURETHRAL RESECTION OF BLADDER TUMOR N/A 10/31/2015   Procedure: RE-STAGINGG TRANSURETHRAL RESECTION OF BLADDER TUMOR (TURBT);  Surgeon: Irine Seal, MD;  Location: [facility];  Service: Urology;  Laterality: N/A;  . TRANSURETHRAL RESECTION OF BLADDER TUMOR N/A 08/17/2016   Procedure: TRANSURETHRAL RESECTION OF BLADDER TUMOR (TURBT);  Surgeon: Cleon Gustin, MD;  Location: [facility];  Service: Urology;  Laterality: N/A;     OB History    Gravida  2   Para      Term      Preterm      AB      Living  2     SAB      TAB      Ectopic      Multiple      Live Births               Home Medications    Prior to Admission medications   Medication Sig Start Date End Date Taking?  Authorizing Provider  aspirin-acetaminophen-caffeine (EXCEDRIN MIGRAINE) [phone] MG tablet Take 2 tablets by mouth 3 (three) times daily as needed for migraine.     [provider]  Blood Glucose Monitoring Suppl (ACCU-CHEK GUIDE) w/Device KIT 1 Piece by Does not apply route as directed. 10/10/18   Cassandria Anger, MD  cyclobenzaprine (FLEXERIL) 5 MG tablet Take 1 tablet (5 mg total) by mouth 3 (three) times daily as needed. 11/21/18   Rolland Porter, MD  FLUoxetine (PROZAC) 20 MG capsule Take 40 mg by mouth at bedtime. 02/24/18   [provider]  glipiZIDE (GLUCOTROL XL) 5 MG 24 hr tablet Take 1 tablet (5 mg total) by mouth daily with breakfast. 10/10/18   Cassandria Anger, MD  glucose blood (ACCU-CHEK GUIDE) test strip Use as instructed 10/10/18   Cassandria Anger, MD  Insulin Glargine (LANTUS SOLOSTAR) 100 UNIT/ML Solostar Pen Inject 1 Units into the skin daily.    [provider]  metFORMIN (GLUCOPHAGE) 1000 MG tablet Take 1 tablet (1,000 mg total) by mouth 2 (two) times daily with a meal. 11/03/18   Nida, Marella Chimes, MD  pantoprazole (PROTONIX) 40 MG tablet Take 40 mg by mouth at bedtime.  10/09/14   [provider]  predniSONE (DELTASONE) 20 MG tablet Take 2 po QD x 3d starting on Tues Nov 10 then 1 po QD x 4 days 11/21/18   Rolland Porter, MD  rosuvastatin (CRESTOR) 20 MG tablet Take 1 tablet (20 mg total) by mouth daily. 10/10/18   Cassandria Anger, MD    Family History Family History  Problem Relation Age of Onset  . Hypertension Mother   . Cancer Mother   . Breast cancer Maternal Aunt   . Lung cancer Maternal Uncle   . Cancer Other   . Diabetes Daughter     Social History Social History   Tobacco Use  . Smoking status: Current Every Day Smoker    Packs/day: 0.50    Types: Cigarettes  . Smokeless tobacco: Never Used  Substance Use Topics  . Alcohol use: No  . Drug use: No  Unemployed   Allergies   Benazepril, Ibuprofen,  Metronidazole, Oxycodone, Adhesive [tape], Codeine, Loratadine, Nystatin-triamcinolone, and Penicillins   Review of Systems Review of Systems  All other systems reviewed and are negative.    Physical Exam Updated Vital Signs BP 133/63   Pulse 64   Temp 98.5 F (36.9 C) (Oral)   Resp 18   Ht 5' (1.524 m)   Wt 65.8 kg   SpO2 96%   BMI 28.32 kg/m   Physical Exam Vitals signs and nursing note reviewed.  Constitutional:      Appearance:  Normal appearance.  HENT:     Head: Normocephalic and atraumatic.     Right Ear: External ear normal.     Left Ear: External ear normal.  Eyes:     Extraocular Movements: Extraocular movements intact.     Conjunctiva/sclera: Conjunctivae normal.  Neck:     Musculoskeletal: Normal range of motion and neck supple. No muscular tenderness.     Comments: She also is nontender in her trapezius muscle. Cardiovascular:     Rate and Rhythm: Normal rate.  Pulmonary:     Effort: Pulmonary effort is normal. No respiratory distress.     Breath sounds: Normal breath sounds.  Musculoskeletal:        General: Tenderness present.     Comments: Patient has limited range of motion in her left shoulder, any movement makes it hurt.  She is nontender over the clavicle or the left trapezius.  She is tender in the left shoulder joint and then also inferior to the subdeltoid bursa.  She has intact flexion and extension of her elbow.  She has good distal pulses.  Skin:    General: Skin is warm and dry.     Findings: No erythema.  Neurological:     General: No focal deficit present.     Mental Status: She is alert and oriented to person, place, and time.     Cranial Nerves: Cranial nerve deficit present.  Psychiatric:        Mood and Affect: Mood normal.        Behavior: Behavior normal.        Thought Content: Thought content normal.      ED Treatments / Results  Labs (all labs ordered are listed, but only abnormal results are displayed) Labs Reviewed   GLUCOSE, CAPILLARY - Abnormal; Notable for the following components:      Result Value   Glucose-Capillary 280 (*)    All other components within normal limits  CBG MONITORING, ED    EKG None  Radiology Dg Shoulder Left  Result Date: 11/21/2018 CLINICAL DATA:  Left shoulder pain EXAM: LEFT SHOULDER - 2+ VIEW COMPARISON:  None. FINDINGS: There is no evidence of fracture or dislocation. There is no evidence of arthropathy or other focal bone abnormality. Soft tissues are unremarkable. IMPRESSION: Negative. Electronically Signed   By: Ulyses Jarred M.D.   On: 11/21/2018 02:32    Procedures Procedures (including critical care time)  Medications Ordered in ED Medications  dexamethasone (DECADRON) injection 4 mg (4 mg Intramuscular Given 11/21/18 0500)  cyclobenzaprine (FLEXERIL) tablet 5 mg (5 mg Oral Given 11/21/18 0459)     Initial Impression / Assessment and Plan / ED Course  I have reviewed the triage vital signs and the nursing notes.  Pertinent labs & imaging results that were available during my care of the patient were reviewed by me and considered in my medical decision making (see chart for details).      When I review her prior lab tests on February 13 of this year her BUN was 9 her creatinine was 0.49.  CBG was done here and was mildly elevated.  She was given a low dose of Decadron IM.  I am going to send her home on low-dose steroids and a muscle relaxer.  She is going to be referred to orthopedics.  We discussed that her x-ray did not show any bony problem such as arthritis however she could have tendon or muscle problems which would not show up on a  regular x-ray.  Final Clinical Impressions(s) / ED Diagnoses   Final diagnoses:  Tendonitis of shoulder, left    ED Discharge Orders         Ordered    predniSONE (DELTASONE) 20 MG tablet     11/21/18 0505    cyclobenzaprine (FLEXERIL) 5 MG tablet  3 times daily PRN     11/21/18 0505         Plan discharge   Rolland Porter, MD, Barbette Or, MD 11/21/18 [id number]

## 2018-11-21 NOTE — ED Triage Notes (Signed)
Pt c/o pain that starts in her left shoulder and shoots down arm x 3 weeks.

## 2018-11-29 ENCOUNTER — Other Ambulatory Visit: Payer: Self-pay

## 2018-11-29 ENCOUNTER — Encounter: Payer: Self-pay | Admitting: Orthopaedic Surgery

## 2018-11-29 ENCOUNTER — Ambulatory Visit: Payer: Medicaid Other | Admitting: Orthopaedic Surgery

## 2018-11-29 VITALS — BP 144/96 | HR 78 | Temp 96.8°F | Ht 60.0 in | Wt 160.0 lb

## 2018-11-29 DIAGNOSIS — M25512 Pain in left shoulder: Secondary | ICD-10-CM | POA: Diagnosis not present

## 2018-11-29 DIAGNOSIS — G8929 Other chronic pain: Secondary | ICD-10-CM

## 2018-11-29 MED ORDER — OXYCODONE-ACETAMINOPHEN 5-325 MG PO TABS: ORAL_TABLET | ORAL | 0 refills | Status: DC

## 2018-11-29 NOTE — Progress Notes (Signed)
Patient LO:VFIEP Sandra [name], female DOB:1975-12-14, 43 y.o. PIR:[id number]  Chief Complaint  Patient presents with  . Shoulder Pain    Left shoulder for over a month.    HPI  Sandra [name] is a 43 y.o. female who has developed pain in the left shoulder over the last few weeks. She has no trauma. She has pain with trying to move her hand overhead. S he has no numbness.  It is getting worse. She has no swelling, no redness.  She has tried ice, heat, tylenol with no help.  She went to the ER 11-20-2018.  I have reviewed the notes and x-rays.  X-rays were negative.   Body mass index is 31.25 kg/m.  ROS  Review of Systems  Constitutional: Positive for activity change.  Musculoskeletal: Positive for arthralgias.  All other systems reviewed and are negative.   All other systems reviewed and are negative.  The following is a summary of the past history medically, past history surgically, known current medicines, social history and family history.  This information is gathered electronically by the computer from prior information and documentation.  I review this each visit and have found including this information at this point in the chart is beneficial and informative.    Past Medical History:  Diagnosis Date  . ADHD (attention deficit hyperactivity disorder)   . Allergic rhinitis   . Anemia    hx of  . Bipolar 1 disorder ([name])   . Bladder tumor   . Cancer ([name])    bladder  . Dizziness   . Full dentures   . GERD (gastroesophageal reflux disease)   . Gross hematuria   . Hyperlipidemia   . Hypertension   . Type 2 diabetes mellitus (Sandra [name])   . Urgency of urination    dysuria, sui  . Wears glasses     Past Surgical History:  Procedure Laterality Date  . CYSTOSCOPY N/A 10/03/2015   Procedure: CYSTOSCOPY;  Surgeon: Irine Seal, MD;  Location: [name];  Service: Urology;  Laterality: N/A;  . CYSTOSCOPY WITH STENT PLACEMENT Right 10/31/2015   Procedure:  CYSTOSCOPY WITH ATTEMPTED RIGHT URETERAL OPENING;  Surgeon: Irine Seal, MD;  Location: [name];  Service: Urology;  Laterality: Right;  . INTERSTIM IMPLANT PLACEMENT N/A 03/14/2018   Procedure: Barrie Lyme IMPLANT FIRST STAGE;  Surgeon: Cleon Gustin, MD;  Location: [name];  Service: Urology;  Laterality: N/A;  . INTERSTIM IMPLANT PLACEMENT N/A 03/30/2018   Procedure: Barrie Lyme IMPLANT SECOND STAGE;  Surgeon: Cleon Gustin, MD;  Location: [name];  Service: Urology;  Laterality: N/A;  . MULTIPLE TOOTH EXTRACTIONS  2015  . OVARIAN CYST REMOVAL Right 2004 approx  . TONSILLECTOMY  12/30/2004  . TOTAL ABDOMINAL HYSTERECTOMY W/ BILATERAL SALPINGOOPHORECTOMY  05/16/2004  . TRANSURETHRAL RESECTION OF BLADDER TUMOR N/A 10/03/2015   Procedure: TRANSURETHRAL RESECTION OF BLADDER TUMOR (TURBT);  Surgeon: Irine Seal, MD;  Location: [name];  Service: Urology;  Laterality: N/A;  . TRANSURETHRAL RESECTION OF BLADDER TUMOR N/A 10/31/2015   Procedure: RE-STAGINGG TRANSURETHRAL RESECTION OF BLADDER TUMOR (TURBT);  Surgeon: Irine Seal, MD;  Location: [name];  Service: Urology;  Laterality: N/A;  . TRANSURETHRAL RESECTION OF BLADDER TUMOR N/A 08/17/2016   Procedure: TRANSURETHRAL RESECTION OF BLADDER TUMOR (TURBT);  Surgeon: Cleon Gustin, MD;  Location: [name];  Service: Urology;  Laterality: N/A;    Family History  Problem Relation Age of Onset  . Hypertension Mother   .  Cancer Mother   . Breast cancer Maternal Aunt   . Lung cancer Maternal Uncle   . Cancer Other   . Diabetes Daughter     Social History Social History   Tobacco Use  . Smoking status: Current Every Day Smoker    Packs/day: 0.50    Types: Cigarettes  . Smokeless tobacco: Never Used  Substance Use Topics  . Alcohol use: No  . Drug use: No    Allergies  Allergen Reactions  . Benazepril Hives  . Ibuprofen Hives    itching  . Metronidazole Nausea And Vomiting   . Oxycodone     Pt has tolerated hydromorphone in the past and takes Norco at home.  . Adhesive [Tape] Rash  . Codeine Nausea And Vomiting and Rash  . Loratadine Rash  . Nystatin-Triamcinolone Rash  . Penicillins Rash    Has patient had a PCN reaction causing immediate rash, facial/tongue/throat swelling, SOB or lightheadedness with hypotension: No Has patient had a PCN reaction causing severe rash involving mucus membranes or skin necrosis: Yes Has patient had a PCN reaction that required hospitalization: No Has patient had a PCN reaction occurring within the last 10 years: no  If all of the above answers are "NO", then may proceed with Cephalosporin use.    Current Outpatient Medications  Medication Sig Dispense Refill  . aspirin-acetaminophen-caffeine (EXCEDRIN MIGRAINE) 250-250-65 MG tablet Take 2 tablets by mouth 3 (three) times daily as needed for migraine.     . Blood Glucose Monitoring Suppl (ACCU-CHEK GUIDE) w/Device KIT 1 Piece by Does not apply route as directed. 1 kit 0  . cyclobenzaprine (FLEXERIL) 5 MG tablet Take 1 tablet (5 mg total) by mouth 3 (three) times daily as needed. 30 tablet 0  . FLUoxetine (PROZAC) 20 MG capsule Take 40 mg by mouth at bedtime.    Marland Kitchen glipiZIDE (GLUCOTROL XL) 5 MG 24 hr tablet Take 1 tablet (5 mg total) by mouth daily with breakfast. 30 tablet 3  . glucose blood (ACCU-CHEK GUIDE) test strip Use as instructed 150 each 2  . Insulin Glargine (LANTUS SOLOSTAR) 100 UNIT/ML Solostar Pen Inject 1 Units into the skin daily.    . metFORMIN (GLUCOPHAGE) 1000 MG tablet Take 1 tablet (1,000 mg total) by mouth 2 (two) times daily with a meal. 60 tablet 3  . oxyCODONE-acetaminophen (PERCOCET/ROXICET) 5-325 MG tablet One tablet every four hours as need for pain,  5 day limit. 25 tablet 0  . pantoprazole (PROTONIX) 40 MG tablet Take 40 mg by mouth at bedtime.   2  . predniSONE (DELTASONE) 20 MG tablet Take 2 po QD x 3d starting on Tues Nov 10 then 1 po QD x 4  days 10 tablet 0  . rosuvastatin (CRESTOR) 20 MG tablet Take 1 tablet (20 mg total) by mouth daily. 30 tablet 3   Current Facility-Administered Medications  Medication Dose Route Frequency Provider Last Rate Last Dose  . betamethasone acetate-betamethasone sodium phosphate (CELESTONE) injection 3 mg  3 mg Intramuscular Once Edrick Kins, DPM         Physical Exam  Blood pressure (!) 144/96, pulse 78, temperature (!) 96.8 F (36 C), height 5' (1.524 m), weight 160 lb (72.6 kg).  Constitutional: overall normal hygiene, normal nutrition, well developed, normal grooming, normal body habitus. Assistive device:none  Musculoskeletal: gait and station Limp none, muscle tone and strength are normal, no tremors or atrophy is present.  .  Neurological: coordination overall normal.  Deep tendon reflex/nerve stretch  intact.  Sensation normal.  Cranial nerves II-XII intact.   Skin:   Normal overall no scars, lesions, ulcers or rashes. No psoriasis.  Psychiatric: Alert and oriented x 3.  Recent memory intact, remote memory unclear.  Normal mood and affect. Well groomed.  Good eye contact.  Cardiovascular: overall no swelling, no varicosities, no edema bilaterally, normal temperatures of the legs and arms, no clubbing, cyanosis and good capillary refill.  Lymphatic: palpation is normal.  Examination of left Upper Extremity is done.  Inspection:   Overall:  Elbow non-tender without crepitus or defects, forearm non-tender without crepitus or defects, wrist non-tender without crepitus or defects, hand non-tender.    Shoulder: with glenohumeral joint tenderness, without effusion.   Upper arm:  without swelling and tenderness   Range of motion:   Overall:  Full range of motion of the elbow, full range of motion of wrist and full range of motion in fingers.   Shoulder:  left  110 degrees forward flexion; 85 degrees abduction; 25 degrees internal rotation, 25 degrees external rotation, 10 degrees  extension, 40 degrees adduction.   Stability:   Overall:  Shoulder, elbow and wrist stable   Strength and Tone:   Overall full shoulder muscles strength, full upper arm strength and normal upper arm bulk and tone.  All other systems reviewed and are negative   The patient has been educated about the nature of the problem(s) and counseled on treatment options.  The patient appeared to understand what I have discussed and is in agreement with it.  Encounter Diagnosis  Name Primary?  . Chronic left shoulder pain Yes   PROCEDURE NOTE:  The patient request injection, verbal consent was obtained.  The left shoulder was prepped appropriately after time out was performed.   Sterile technique was observed and injection of 1 cc of Depo-Medrol 40 mg with several cc's of plain xylocaine. Anesthesia was provided by ethyl chloride and a 20-gauge needle was used to inject the shoulder area. A posterior approach was used.  The injection was tolerated well.  A band aid dressing was applied.  The patient was advised to apply ice later today and tomorrow to the injection sight as needed.    PLAN Call if any problems.  Precautions discussed.  Continue current medications.   Return to clinic 2 weeks   Electronically Signed Sanjuana Kava, MD 11/17/20209:23 AM

## 2018-12-13 ENCOUNTER — Encounter: Payer: Self-pay | Admitting: Orthopaedic Surgery

## 2018-12-13 ENCOUNTER — Ambulatory Visit: Payer: Medicaid Other | Admitting: Orthopaedic Surgery

## 2018-12-13 ENCOUNTER — Other Ambulatory Visit: Payer: Self-pay

## 2018-12-13 VITALS — BP 137/91 | HR 94 | Ht 60.0 in | Wt 160.0 lb

## 2018-12-13 DIAGNOSIS — M25512 Pain in left shoulder: Secondary | ICD-10-CM

## 2018-12-13 DIAGNOSIS — G8929 Other chronic pain: Secondary | ICD-10-CM

## 2018-12-13 MED ORDER — OXYCODONE-ACETAMINOPHEN 5-325 MG PO TABS: ORAL_TABLET | ORAL | 0 refills | Status: DC

## 2018-12-13 NOTE — Progress Notes (Signed)
Patient SE:GBTDV Sandra [name], female DOB:April 06, 1975, 43 y.o. VOH:[id number]  Chief Complaint  Patient presents with  . Shoulder Pain    left    HPI  Sandra [name] is a 43 y.o. female who continues to have left shoulder pain.  She is no better, even after the injection last time. She has pain with overhead use and laying on it at night.  She has no new trauma, no numbness.  She needs MRI but has to wait six weeks for insurance reasons.   Body mass index is 31.25 kg/m.  ROS  Review of Systems  Constitutional: Positive for activity change.  Musculoskeletal: Positive for arthralgias.  All other systems reviewed and are negative.   All other systems reviewed and are negative.  The following is a summary of the past history medically, past history surgically, known current medicines, social history and family history.  This information is gathered electronically by the computer from prior information and documentation.  I review this each visit and have found including this information at this point in the chart is beneficial and informative.    Past Medical History:  Diagnosis Date  . ADHD (attention deficit hyperactivity disorder)   . Allergic rhinitis   . Anemia    hx of  . Bipolar 1 disorder ([place])   . Bladder tumor   . Cancer ([place])    bladder  . Dizziness   . Full dentures   . GERD (gastroesophageal reflux disease)   . Gross hematuria   . Hyperlipidemia   . Hypertension   . Type 2 diabetes mellitus ([place])   . Urgency of urination    dysuria, sui  . Wears glasses     Past Surgical History:  Procedure Laterality Date  . CYSTOSCOPY N/A 10/03/2015   Procedure: CYSTOSCOPY;  Surgeon: Irine Seal, MD;  Location: [facility];  Service: Urology;  Laterality: N/A;  . CYSTOSCOPY WITH STENT PLACEMENT Right 10/31/2015   Procedure: CYSTOSCOPY WITH ATTEMPTED RIGHT URETERAL OPENING;  Surgeon: Irine Seal, MD;  Location: [facility];  Service: Urology;   Laterality: Right;  . INTERSTIM IMPLANT PLACEMENT N/A 03/14/2018   Procedure: Barrie Lyme IMPLANT FIRST STAGE;  Surgeon: Cleon Gustin, MD;  Location: [facility];  Service: Urology;  Laterality: N/A;  . INTERSTIM IMPLANT PLACEMENT N/A 03/30/2018   Procedure: Barrie Lyme IMPLANT SECOND STAGE;  Surgeon: Cleon Gustin, MD;  Location: [facility];  Service: Urology;  Laterality: N/A;  . MULTIPLE TOOTH EXTRACTIONS  2015  . OVARIAN CYST REMOVAL Right 2004 approx  . TONSILLECTOMY  12/30/2004  . TOTAL ABDOMINAL HYSTERECTOMY W/ BILATERAL SALPINGOOPHORECTOMY  05/16/2004  . TRANSURETHRAL RESECTION OF BLADDER TUMOR N/A 10/03/2015   Procedure: TRANSURETHRAL RESECTION OF BLADDER TUMOR (TURBT);  Surgeon: Irine Seal, MD;  Location: [facility];  Service: Urology;  Laterality: N/A;  . TRANSURETHRAL RESECTION OF BLADDER TUMOR N/A 10/31/2015   Procedure: RE-STAGINGG TRANSURETHRAL RESECTION OF BLADDER TUMOR (TURBT);  Surgeon: Irine Seal, MD;  Location: [facility];  Service: Urology;  Laterality: N/A;  . TRANSURETHRAL RESECTION OF BLADDER TUMOR N/A 08/17/2016   Procedure: TRANSURETHRAL RESECTION OF BLADDER TUMOR (TURBT);  Surgeon: Cleon Gustin, MD;  Location: [facility];  Service: Urology;  Laterality: N/A;    Family History  Problem Relation Age of Onset  . Hypertension Mother   . Cancer Mother   . Breast cancer Maternal Aunt   . Lung cancer Maternal Uncle   . Cancer Other   . Diabetes Daughter  Social History Social History   Tobacco Use  . Smoking status: Current Every Day Smoker    Packs/day: 0.50    Types: Cigarettes  . Smokeless tobacco: Never Used  Substance Use Topics  . Alcohol use: No  . Drug use: No    Allergies  Allergen Reactions  . Benazepril Hives  . Ibuprofen Hives    itching  . Metronidazole Nausea And Vomiting  . Oxycodone     Pt has tolerated hydromorphone in the past and takes Norco at home.  . Adhesive [Tape] Rash  . Codeine Nausea And  Vomiting and Rash  . Loratadine Rash  . Nystatin-Triamcinolone Rash  . Penicillins Rash    Has patient had a PCN reaction causing immediate rash, facial/tongue/throat swelling, SOB or lightheadedness with hypotension: No Has patient had a PCN reaction causing severe rash involving mucus membranes or skin necrosis: Yes Has patient had a PCN reaction that required hospitalization: No Has patient had a PCN reaction occurring within the last 10 years: no  If all of the above answers are "NO", then may proceed with Cephalosporin use.    Current Outpatient Medications  Medication Sig Dispense Refill  . aspirin-acetaminophen-caffeine (EXCEDRIN MIGRAINE) 250-250-65 MG tablet Take 2 tablets by mouth 3 (three) times daily as needed for migraine.     . Blood Glucose Monitoring Suppl (ACCU-CHEK GUIDE) w/Device KIT 1 Piece by Does not apply route as directed. 1 kit 0  . cyclobenzaprine (FLEXERIL) 5 MG tablet Take 1 tablet (5 mg total) by mouth 3 (three) times daily as needed. 30 tablet 0  . FLUoxetine (PROZAC) 20 MG capsule Take 40 mg by mouth at bedtime.    Marland Kitchen glipiZIDE (GLUCOTROL XL) 5 MG 24 hr tablet Take 1 tablet (5 mg total) by mouth daily with breakfast. 30 tablet 3  . glucose blood (ACCU-CHEK GUIDE) test strip Use as instructed 150 each 2  . Insulin Glargine (LANTUS SOLOSTAR) 100 UNIT/ML Solostar Pen Inject 1 Units into the skin daily.    . metFORMIN (GLUCOPHAGE) 1000 MG tablet Take 1 tablet (1,000 mg total) by mouth 2 (two) times daily with a meal. 60 tablet 3  . oxyCODONE-acetaminophen (PERCOCET/ROXICET) 5-325 MG tablet One tablet every four hours as need for pain,  5 day limit. 25 tablet 0  . pantoprazole (PROTONIX) 40 MG tablet Take 40 mg by mouth at bedtime.   2  . predniSONE (DELTASONE) 20 MG tablet Take 2 po QD x 3d starting on Tues Nov 10 then 1 po QD x 4 days 10 tablet 0  . rosuvastatin (CRESTOR) 20 MG tablet Take 1 tablet (20 mg total) by mouth daily. 30 tablet 3   Current  Facility-Administered Medications  Medication Dose Route Frequency Provider Last Rate Last Dose  . betamethasone acetate-betamethasone sodium phosphate (CELESTONE) injection 3 mg  3 mg Intramuscular Once Edrick Kins, DPM         Physical Exam  Blood pressure (!) 137/91, pulse 94, height 5' (1.524 m), weight 160 lb (72.6 kg).  Constitutional: overall normal hygiene, normal nutrition, well developed, normal grooming, normal body habitus. Assistive device:none  Musculoskeletal: gait and station Limp none, muscle tone and strength are normal, no tremors or atrophy is present.  .  Neurological: coordination overall normal.  Deep tendon reflex/nerve stretch intact.  Sensation normal.  Cranial nerves II-XII intact.   Skin:   Normal overall no scars, lesions, ulcers or rashes. No psoriasis.  Psychiatric: Alert and oriented x 3.  Recent memory intact, remote  memory unclear.  Normal mood and affect. Well groomed.  Good eye contact.  Cardiovascular: overall no swelling, no varicosities, no edema bilaterally, normal temperatures of the legs and arms, no clubbing, cyanosis and good capillary refill.  Lymphatic: palpation is normal.  Examination of left Upper Extremity is done.  Inspection:   Overall:  Elbow non-tender without crepitus or defects, forearm non-tender without crepitus or defects, wrist non-tender without crepitus or defects, hand non-tender.    Shoulder: with glenohumeral joint tenderness, without effusion.   Upper arm:  without swelling and tenderness   Range of motion:   Overall:  Full range of motion of the elbow, full range of motion of wrist and full range of motion in fingers.   Shoulder:  left  145 degrees forward flexion; 120 degrees abduction; 30 degrees internal rotation, 30 degrees external rotation, 10 degrees extension, 40 degrees adduction.   Stability:   Overall:  Shoulder, elbow and wrist stable   Strength and Tone:   Overall full shoulder muscles strength,  full upper arm strength and normal upper arm bulk and tone.  All other systems reviewed and are negative   The patient has been educated about the nature of the problem(s) and counseled on treatment options.  The patient appeared to understand what I have discussed and is in agreement with it.  Encounter Diagnosis  Name Primary?  . Chronic left shoulder pain Yes    PLAN Call if any problems.  Precautions discussed.  Continue current medications.   Return to clinic 3 weeks   I have reviewed the [place] web site prior to prescribing narcotic medicine for this patient.   Electronically Signed Sanjuana Kava, MD 12/1/20209:52 AM

## 2018-12-16 ENCOUNTER — Emergency Department (HOSPITAL_COMMUNITY)
Admission: EM | Admit: 2018-12-16 | Discharge: 2018-12-17 | Disposition: A | Discharge status: Home / Self Care | Payer: Medicaid Other | Attending: Emergency Medicine | Admitting: Emergency Medicine

## 2018-12-16 ENCOUNTER — Other Ambulatory Visit: Payer: Self-pay

## 2018-12-16 DIAGNOSIS — I10 Essential (primary) hypertension: Secondary | ICD-10-CM | POA: Diagnosis not present

## 2018-12-16 DIAGNOSIS — E1165 Type 2 diabetes mellitus with hyperglycemia: Secondary | ICD-10-CM | POA: Diagnosis not present

## 2018-12-16 DIAGNOSIS — R739 Hyperglycemia, unspecified: Secondary | ICD-10-CM

## 2018-12-16 DIAGNOSIS — G629 Polyneuropathy, unspecified: Secondary | ICD-10-CM | POA: Diagnosis not present

## 2018-12-16 DIAGNOSIS — F17210 Nicotine dependence, cigarettes, uncomplicated: Secondary | ICD-10-CM | POA: Insufficient documentation

## 2018-12-16 DIAGNOSIS — M79672 Pain in left foot: Secondary | ICD-10-CM

## 2018-12-16 DIAGNOSIS — Z79899 Other long term (current) drug therapy: Secondary | ICD-10-CM | POA: Diagnosis not present

## 2018-12-16 DIAGNOSIS — Z7984 Long term (current) use of oral hypoglycemic drugs: Secondary | ICD-10-CM | POA: Insufficient documentation

## 2018-12-16 LAB — CBG MONITORING, ED: Glucose-Capillary: >600 mg/dL (ref 70–99)

## 2018-12-16 NOTE — ED Triage Notes (Signed)
Pt states her blood sugars have been running high for a couple of days "off her scale".    Pt denies other complaints

## 2018-12-17 LAB — CBC WITH DIFFERENTIAL/PLATELET
Abs Immature Granulocytes: 0.03 10*3/uL (ref 0.00–0.07)
Basophils Absolute: 0 10*3/uL (ref 0.0–0.1)
Basophils Relative: 0 %
Eosinophils Absolute: 0.1 10*3/uL (ref 0.0–0.5)
Eosinophils Relative: 1 %
HCT: 45 % (ref 36.0–46.0)
Hemoglobin: 14.3 g/dL (ref 12.0–15.0)
Immature Granulocytes: 0 %
Lymphocytes Relative: 33 %
Lymphs Abs: 2.9 10*3/uL (ref 0.7–4.0)
MCH: 27.2 pg (ref 26.0–34.0)
MCHC: 31.8 g/dL (ref 30.0–36.0)
MCV: 85.6 fL (ref 80.0–100.0)
Monocytes Absolute: 0.6 10*3/uL (ref 0.1–1.0)
Monocytes Relative: 6 %
Neutro Abs: 5.1 10*3/uL (ref 1.7–7.7)
Neutrophils Relative %: 60 %
Platelets: 315 10*3/uL (ref 150–400)
RBC: 5.26 MIL/uL — ABNORMAL HIGH (ref 3.87–5.11)
RDW: 13 % (ref 11.5–15.5)
WBC: 8.7 10*3/uL (ref 4.0–10.5)
nRBC: 0 % (ref 0.0–0.2)

## 2018-12-17 LAB — URINALYSIS, ROUTINE W REFLEX MICROSCOPIC
Bacteria, UA: NONE SEEN
Bilirubin Urine: NEGATIVE
Glucose, UA: >=500 mg/dL — AB
Hgb urine dipstick: NEGATIVE
Ketones, ur: NEGATIVE mg/dL
Leukocytes,Ua: NEGATIVE
Nitrite: NEGATIVE
Protein, ur: NEGATIVE mg/dL
Specific Gravity, Urine: 1.03 (ref 1.005–1.030)
pH: 5 (ref 5.0–8.0)

## 2018-12-17 LAB — BASIC METABOLIC PANEL
Anion gap: 14 (ref 5–15)
BUN: 11 mg/dL (ref 6–20)
CO2: 23 mmol/L (ref 22–32)
Calcium: 9.7 mg/dL (ref 8.9–10.3)
Chloride: 93 mmol/L — ABNORMAL LOW (ref 98–111)
Creatinine, Ser: 0.75 mg/dL (ref 0.44–1.00)
GFR calc Af Amer: >60 mL/min (ref >60)
GFR calc non Af Amer: >60 mL/min (ref >60)
Glucose, Bld: 630 mg/dL (ref 70–99)
Potassium: 4 mmol/L (ref 3.5–5.1)
Sodium: 130 mmol/L — ABNORMAL LOW (ref 135–145)

## 2018-12-17 LAB — CBG MONITORING, ED: Glucose-Capillary: 314 mg/dL — ABNORMAL HIGH (ref 70–99)

## 2018-12-17 MED ORDER — GABAPENTIN 100 MG PO CAPS: 100.0000 mg | ORAL_CAPSULE | Freq: Three times a day (TID) | ORAL | 0 refills | Status: DC

## 2018-12-17 MED ORDER — SODIUM CHLORIDE 0.9 % IV BOLUS
1000.0000 mL | Freq: Once | INTRAVENOUS | Status: AC
  Administered 2018-12-17: 1000 mL via INTRAVENOUS

## 2018-12-17 MED ORDER — GABAPENTIN 100 MG PO CAPS
200.0000 mg | ORAL_CAPSULE | Freq: Once | ORAL | Status: AC
  Administered 2018-12-17: 200 mg via ORAL
  Filled 2018-12-17: qty 2

## 2018-12-17 MED ORDER — INSULIN ASPART 100 UNIT/ML IV SOLN
10.0000 [IU] | Freq: Once | INTRAVENOUS | Status: AC
  Administered 2018-12-17: 10 [IU] via INTRAVENOUS

## 2018-12-17 NOTE — Discharge Instructions (Addendum)
Please work with your endocrinologist to adjust your medication so that your blood sugar doesn't go so high.

## 2018-12-17 NOTE — ED Notes (Signed)
Pt ambulatory to bathroom and back to room 

## 2018-12-17 NOTE — ED Provider Notes (Signed)
[facility] EMERGENCY DEPARTMENT Provider Note   CSN: [id number] Arrival date & time: 12/16/18  2300    History   Chief Complaint Chief Complaint  Patient presents with  . Hyperglycemia    HPI Sandra [name] is a 43 y.o. female.   The history is provided by the patient.  Hyperglycemia She has history of diabetes, hypertension, hyperlipidemia and has been running high glucoses at home for the last several days.  Her glucose meter has been reading high.  There has been associated fatigue, polyuria, polydipsia.  She denies blurred vision.  There has been no nausea or vomiting.  She is currently on glipizide and Metformin for her glucose, her endocrinologist took her off of insulin in July.  She is also complaining of swelling and redness and pain in her left foot.  She has not done anything to treat this, but she is asking for medicine for her pain.  Past Medical History:  Diagnosis Date  . ADHD (attention deficit hyperactivity disorder)   . Allergic rhinitis   . Anemia    hx of  . Bipolar 1 disorder ([place])   . Bladder tumor   . Cancer ([place])    bladder  . Dizziness   . Full dentures   . GERD (gastroesophageal reflux disease)   . Gross hematuria   . Hyperlipidemia   . Hypertension   . Type 2 diabetes mellitus ([place])   . Urgency of urination    dysuria, sui  . Wears glasses     Patient Active Problem List   Diagnosis Date Noted  . Uncontrolled type 2 diabetes mellitus with hyperglycemia ([place]) 10/10/2018  . Mixed hyperlipidemia 10/10/2018  . GAD (generalized anxiety disorder) 12/17/2016  . MDD (major depressive disorder), recurrent episode, moderate ([place]) 12/17/2016  . Attention deficit hyperactivity disorder (ADHD) 12/17/2016  . Postoperative anemia due to acute blood loss 11/04/2015  . Bladder cancer ([place]) 10/31/2015  . CARPAL TUNNEL SYNDROME 10/02/2009    Past Surgical History:  Procedure Laterality Date  . CYSTOSCOPY N/A 10/03/2015   Procedure: CYSTOSCOPY;   Surgeon: Irine Seal, MD;  Location: [facility];  Service: Urology;  Laterality: N/A;  . CYSTOSCOPY WITH STENT PLACEMENT Right 10/31/2015   Procedure: CYSTOSCOPY WITH ATTEMPTED RIGHT URETERAL OPENING;  Surgeon: Irine Seal, MD;  Location: [facility];  Service: Urology;  Laterality: Right;  . INTERSTIM IMPLANT PLACEMENT N/A 03/14/2018   Procedure: Barrie Lyme IMPLANT FIRST STAGE;  Surgeon: Cleon Gustin, MD;  Location: [facility];  Service: Urology;  Laterality: N/A;  . INTERSTIM IMPLANT PLACEMENT N/A 03/30/2018   Procedure: Barrie Lyme IMPLANT SECOND STAGE;  Surgeon: Cleon Gustin, MD;  Location: [facility];  Service: Urology;  Laterality: N/A;  . MULTIPLE TOOTH EXTRACTIONS  2015  . OVARIAN CYST REMOVAL Right 2004 approx  . TONSILLECTOMY  12/30/2004  . TOTAL ABDOMINAL HYSTERECTOMY W/ BILATERAL SALPINGOOPHORECTOMY  05/16/2004  . TRANSURETHRAL RESECTION OF BLADDER TUMOR N/A 10/03/2015   Procedure: TRANSURETHRAL RESECTION OF BLADDER TUMOR (TURBT);  Surgeon: Irine Seal, MD;  Location: [facility];  Service: Urology;  Laterality: N/A;  . TRANSURETHRAL RESECTION OF BLADDER TUMOR N/A 10/31/2015   Procedure: RE-STAGINGG TRANSURETHRAL RESECTION OF BLADDER TUMOR (TURBT);  Surgeon: Irine Seal, MD;  Location: [facility];  Service: Urology;  Laterality: N/A;  . TRANSURETHRAL RESECTION OF BLADDER TUMOR N/A 08/17/2016   Procedure: TRANSURETHRAL RESECTION OF BLADDER TUMOR (TURBT);  Surgeon: Cleon Gustin, MD;  Location: [facility];  Service: Urology;  Laterality:  N/A;     OB History    Gravida  2   Para      Term      Preterm      AB      Living  2     SAB      TAB      Ectopic      Multiple      Live Births               Home Medications    Prior to Admission medications   Medication Sig Start Date End Date Taking? Authorizing Provider  aspirin-acetaminophen-caffeine (EXCEDRIN MIGRAINE) [phone] MG tablet Take 2 tablets  by mouth 3 (three) times daily as needed for migraine.     [provider]  Blood Glucose Monitoring Suppl (ACCU-CHEK GUIDE) w/Device KIT 1 Piece by Does not apply route as directed. 10/10/18   Cassandria Anger, MD  cyclobenzaprine (FLEXERIL) 5 MG tablet Take 1 tablet (5 mg total) by mouth 3 (three) times daily as needed. 11/21/18   Rolland Porter, MD  FLUoxetine (PROZAC) 20 MG capsule Take 40 mg by mouth at bedtime. 02/24/18   [provider]  glipiZIDE (GLUCOTROL XL) 5 MG 24 hr tablet Take 1 tablet (5 mg total) by mouth daily with breakfast. 10/10/18   Cassandria Anger, MD  glucose blood (ACCU-CHEK GUIDE) test strip Use as instructed 10/10/18   Cassandria Anger, MD  Insulin Glargine (LANTUS SOLOSTAR) 100 UNIT/ML Solostar Pen Inject 1 Units into the skin daily.    [provider]  metFORMIN (GLUCOPHAGE) 1000 MG tablet Take 1 tablet (1,000 mg total) by mouth 2 (two) times daily with a meal. 11/03/18   Nida, Marella Chimes, MD  oxyCODONE-acetaminophen (PERCOCET/ROXICET) 5-325 MG tablet One tablet every four hours as need for pain,  5 day limit. 12/13/18   Sanjuana Kava, MD  pantoprazole (PROTONIX) 40 MG tablet Take 40 mg by mouth at bedtime.  10/09/14   [provider]  predniSONE (DELTASONE) 20 MG tablet Take 2 po QD x 3d starting on Tues Nov 10 then 1 po QD x 4 days 11/21/18   Rolland Porter, MD  rosuvastatin (CRESTOR) 20 MG tablet Take 1 tablet (20 mg total) by mouth daily. 10/10/18   Cassandria Anger, MD    Family History Family History  Problem Relation Age of Onset  . Hypertension Mother   . Cancer Mother   . Breast cancer Maternal Aunt   . Lung cancer Maternal Uncle   . Cancer Other   . Diabetes Daughter     Social History Social History   Tobacco Use  . Smoking status: Current Every Day Smoker    Packs/day: 0.50    Types: Cigarettes  . Smokeless tobacco: Never Used  Substance Use Topics  . Alcohol use: No  . Drug use: No      Allergies   Benazepril, Ibuprofen, Metronidazole, Oxycodone, Adhesive [tape], Codeine, Loratadine, Nystatin-triamcinolone, and Penicillins   Review of Systems Review of Systems  All other systems reviewed and are negative.    Physical Exam Updated Vital Signs BP (!) 146/78 (BP Location: Right Arm)   Pulse 74   Temp 98.3 F (36.8 C) (Oral)   Resp 18   Ht '4\' 11"'  (1.499 m)   Wt 67.6 kg   SpO2 98%   BMI 30.09 kg/m   Physical Exam Vitals signs and nursing note reviewed.    43 year old female, resting comfortably and in no  acute distress. Vital signs are significant for mildly elevated blood pressure. Oxygen saturation is 98%, which is normal. Head is normocephalic and atraumatic. PERRLA, EOMI. Oropharynx is clear. Neck is nontender and supple without adenopathy or JVD. Back is nontender and there is no CVA tenderness. Lungs are clear without rales, wheezes, or rhonchi. Chest is nontender. Heart has regular rate and rhythm without murmur. Abdomen is soft, flat, nontender without masses or hepatosplenomegaly and peristalsis is normoactive. Extremities have no cyanosis or edema, full range of motion is present. Skin is warm and dry without rash. Neurologic: Mental status is normal, cranial nerves are intact, there are no motor deficits.  Decreased sensation noted in the feet and distal legs consistent with peripheral neuropathy.  ED Treatments / Results  Labs (all labs ordered are listed, but only abnormal results are displayed) Labs Reviewed  BASIC METABOLIC PANEL - Abnormal; Notable for the following components:      Result Value   Sodium 130 (*)    Chloride 93 (*)    Glucose, Bld 630 (*)    All other components within normal limits  CBC WITH DIFFERENTIAL/PLATELET - Abnormal; Notable for the following components:   RBC 5.26 (*)    All other components within normal limits  URINALYSIS, ROUTINE W REFLEX MICROSCOPIC - Abnormal; Notable for the following components:    Color, Urine STRAW (*)    Glucose, UA >=500 (*)    All other components within normal limits  CBG MONITORING, ED - Abnormal; Notable for the following components:   Glucose-Capillary >600 (*)    All other components within normal limits  CBG MONITORING, ED - Abnormal; Notable for the following components:   Glucose-Capillary 314 (*)    All other components within normal limits    EKG EKG Interpretation  Date/Time:  Friday December 16 2018 23:56:51 EST Ventricular Rate:  71 PR Interval:    QRS Duration: 97 QT Interval:  393 QTC Calculation: 428 R Axis:   70 Text Interpretation: Sinus rhythm Normal ECG When compared with ECG of 02/24/2018, No significant change was found Confirmed by Delora Fuel ([id number]) on 12/17/2018 12:16:57 AM   Procedures Procedures   Medications Ordered in ED Medications  sodium chloride 0.9 % bolus 1,000 mL (0 mLs Intravenous Stopped 12/17/18 0133)  insulin aspart (novoLOG) injection 10 Units (10 Units Intravenous Given 12/17/18 0140)  gabapentin (NEURONTIN) capsule 200 mg (200 mg Oral Given 12/17/18 0139)     Initial Impression / Assessment and Plan / ED Course  I have reviewed the triage vital signs and the nursing notes.  Pertinent labs & imaging results that were available during my care of the patient were reviewed by me and considered in my medical decision making (see chart for details).  Hyperglycemia.  Doubt ketoacidosis.  Capillary blood glucose is greater than 600, will get basic metabolic panel and check urinalysis.  In the meantime, she is given IV fluids and insulin.  Foot pain seems most likely to be due to neuropathy as no obvious cause for pain seen on exam but she does have evidence of peripheral neuropathy.  She will be given a trial with a single dose of gabapentin.  Old records are reviewed, and she does have prior ED visit for hyperglycemia.  CO2 is normal and anion gap is normal.  Glucose has come down to 314.  She is felt to be safe  for discharge.  She did not have any improvement in her foot pain with gabapentin, but will try  her on an outpatient regimen to see if it helps.  She is instructed to follow-up with her endocrinologist to get adjust her diabetic medications.  Final Clinical Impressions(s) / ED Diagnoses   Final diagnoses:  Hyperglycemia  Pain in left foot  Peripheral polyneuropathy    ED Discharge Orders         Ordered    gabapentin (NEURONTIN) 100 MG capsule  3 times daily     12/17/18 0199           Delora Fuel, MD 24/15/51 [phone]

## 2018-12-17 NOTE — ED Notes (Signed)
Date and time results received: 12/17/18 1:33 AM (use smartphrase ".now" to insert current time)  Test: glucose Critical Value: 630  Name of Provider Notified: Dr Roxanne Mins  Orders Received? Or Actions Taken?: see chart

## 2019-01-03 ENCOUNTER — Encounter: Payer: Self-pay | Admitting: Orthopaedic Surgery

## 2019-01-03 ENCOUNTER — Other Ambulatory Visit: Payer: Self-pay

## 2019-01-03 ENCOUNTER — Encounter: Payer: Self-pay | Admitting: Urology

## 2019-01-03 ENCOUNTER — Ambulatory Visit: Payer: Medicaid Other | Admitting: Orthopaedic Surgery

## 2019-01-03 VITALS — BP 147/99 | HR 85 | Temp 97.2°F | Ht 60.0 in | Wt 154.5 lb

## 2019-01-03 DIAGNOSIS — M25512 Pain in left shoulder: Secondary | ICD-10-CM

## 2019-01-03 DIAGNOSIS — G8929 Other chronic pain: Secondary | ICD-10-CM

## 2019-01-03 DIAGNOSIS — F17210 Nicotine dependence, cigarettes, uncomplicated: Secondary | ICD-10-CM | POA: Diagnosis not present

## 2019-01-03 MED ORDER — OXYCODONE-ACETAMINOPHEN 5-325 MG PO TABS: ORAL_TABLET | ORAL | 0 refills | Status: DC

## 2019-01-03 NOTE — Patient Instructions (Signed)

## 2019-01-03 NOTE — Progress Notes (Signed)
Patient HU:[id number] Sandra [name], female DOB:06/06/1975, 43 y.o. WYO:[id number]  Chief Complaint  Patient presents with  . Shoulder Pain    L/Hurts and the pain is radiating down into arm    HPI  Sandra [name] is a 43 y.o. female who has continued pain of the left shoulder not getting any better.  The injection did not help.  I will get MRI of the shoulder.  Return after the MRI.  She has pain with most any movement, no new trauma, no numbness.   Body mass index is 30.17 kg/m.  ROS  Review of Systems  Constitutional: Positive for activity change.  Musculoskeletal: Positive for arthralgias.  All other systems reviewed and are negative.   All other systems reviewed and are negative.  The following is a summary of the past history medically, past history surgically, known current medicines, social history and family history.  This information is gathered electronically by the computer from prior information and documentation.  I review this each visit and have found including this information at this point in the chart is beneficial and informative.    Past Medical History:  Diagnosis Date  . ADHD (attention deficit hyperactivity disorder)   . Allergic rhinitis   . Anemia    hx of  . Bipolar 1 disorder ([place])   . Bladder tumor   . Cancer ([place])    bladder  . Dizziness   . Full dentures   . GERD (gastroesophageal reflux disease)   . Gross hematuria   . Hyperlipidemia   . Hypertension   . Type 2 diabetes mellitus ([place])   . Urgency of urination    dysuria, sui  . Wears glasses     Past Surgical History:  Procedure Laterality Date  . CYSTOSCOPY N/A 10/03/2015   Procedure: CYSTOSCOPY;  Surgeon: Irine Seal, MD;  Location: [facility];  Service: Urology;  Laterality: N/A;  . CYSTOSCOPY WITH STENT PLACEMENT Right 10/31/2015   Procedure: CYSTOSCOPY WITH ATTEMPTED RIGHT URETERAL OPENING;  Surgeon: Irine Seal, MD;  Location: [facility];  Service:  Urology;  Laterality: Right;  . INTERSTIM IMPLANT PLACEMENT N/A 03/14/2018   Procedure: [place]rie [place] IMPLANT FIRST STAGE;  Surgeon: Cleon Gustin, MD;  Location: [facility];  Service: Urology;  Laterality: N/A;  . INTERSTIM IMPLANT PLACEMENT N/A 03/30/2018   Procedure: Barrie Lyme IMPLANT SECOND STAGE;  Surgeon: Cleon Gustin, MD;  Location: [facility];  Service: Urology;  Laterality: N/A;  . MULTIPLE TOOTH EXTRACTIONS  2015  . OVARIAN CYST REMOVAL Right 2004 approx  . TONSILLECTOMY  12/30/2004  . TOTAL ABDOMINAL HYSTERECTOMY W/ BILATERAL SALPINGOOPHORECTOMY  05/16/2004  . TRANSURETHRAL RESECTION OF BLADDER TUMOR N/A 10/03/2015   Procedure: TRANSURETHRAL RESECTION OF BLADDER TUMOR (TURBT);  Surgeon: Irine Seal, MD;  Location: [facility];  Service: Urology;  Laterality: N/A;  . TRANSURETHRAL RESECTION OF BLADDER TUMOR N/A 10/31/2015   Procedure: RE-STAGINGG TRANSURETHRAL RESECTION OF BLADDER TUMOR (TURBT);  Surgeon: Irine Seal, MD;  Location: [facility];  Service: Urology;  Laterality: N/A;  . TRANSURETHRAL RESECTION OF BLADDER TUMOR N/A 08/17/2016   Procedure: TRANSURETHRAL RESECTION OF BLADDER TUMOR (TURBT);  Surgeon: Cleon Gustin, MD;  Location: [facility];  Service: Urology;  Laterality: N/A;    Family History  Problem Relation Age of Onset  . Hypertension Mother   . Cancer Mother   . Breast cancer Maternal Aunt   . Lung cancer Maternal Uncle   . Cancer Other   . Diabetes  Daughter     Social History Social History   Tobacco Use  . Smoking status: Current Every Day Smoker    Packs/day: 0.50    Types: Cigarettes  . Smokeless tobacco: Never Used  Substance Use Topics  . Alcohol use: No  . Drug use: No    Allergies  Allergen Reactions  . Benazepril Hives  . Ibuprofen Hives    itching  . Metronidazole Nausea And Vomiting  . Oxycodone     Pt has tolerated hydromorphone in the past and takes Norco at home.  . Adhesive [Tape] Rash  . Codeine  Nausea And Vomiting and Rash  . Loratadine Rash  . Nystatin-Triamcinolone Rash  . Penicillins Rash    Has patient had a PCN reaction causing immediate rash, facial/tongue/throat swelling, SOB or lightheadedness with hypotension: No Has patient had a PCN reaction causing severe rash involving mucus membranes or skin necrosis: Yes Has patient had a PCN reaction that required hospitalization: No Has patient had a PCN reaction occurring within the last 10 years: no  If all of the above answers are "NO", then may proceed with Cephalosporin use.    Current Outpatient Medications  Medication Sig Dispense Refill  . aspirin-acetaminophen-caffeine (EXCEDRIN MIGRAINE) 250-250-65 MG tablet Take 2 tablets by mouth 3 (three) times daily as needed for migraine.     . Blood Glucose Monitoring Suppl (ACCU-CHEK GUIDE) w/Device KIT 1 Piece by Does not apply route as directed. 1 kit 0  . cyclobenzaprine (FLEXERIL) 5 MG tablet Take 1 tablet (5 mg total) by mouth 3 (three) times daily as needed. 30 tablet 0  . FLUoxetine (PROZAC) 20 MG capsule Take 40 mg by mouth at bedtime.    . gabapentin (NEURONTIN) 100 MG capsule Take 1 capsule (100 mg total) by mouth 3 (three) times daily. 60 capsule 0  . glipiZIDE (GLUCOTROL XL) 5 MG 24 hr tablet Take 1 tablet (5 mg total) by mouth daily with breakfast. 30 tablet 3  . glucose blood (ACCU-CHEK GUIDE) test strip Use as instructed 150 each 2  . Insulin Glargine (LANTUS SOLOSTAR) 100 UNIT/ML Solostar Pen Inject 1 Units into the skin daily.    . metFORMIN (GLUCOPHAGE) 1000 MG tablet Take 1 tablet (1,000 mg total) by mouth 2 (two) times daily with a meal. 60 tablet 3  . oxyCODONE-acetaminophen (PERCOCET/ROXICET) 5-325 MG tablet One tablet every four hours as need for pain,  5 day limit. 25 tablet 0  . pantoprazole (PROTONIX) 40 MG tablet Take 40 mg by mouth at bedtime.   2  . predniSONE (DELTASONE) 20 MG tablet Take 2 po QD x 3d starting on Tues Nov 10 then 1 po QD x 4 days 10  tablet 0  . rosuvastatin (CRESTOR) 20 MG tablet Take 1 tablet (20 mg total) by mouth daily. 30 tablet 3   Current Facility-Administered Medications  Medication Dose Route Frequency Provider Last Rate Last Admin  . betamethasone acetate-betamethasone sodium phosphate (CELESTONE) injection 3 mg  3 mg Intramuscular Once Edrick Kins, DPM         Physical Exam  Blood pressure (!) 147/99, pulse 85, temperature (!) 97.2 F (36.2 C), height 5' (1.524 m), weight 154 lb 8 oz (70.1 kg).  Constitutional: overall normal hygiene, normal nutrition, well developed, normal grooming, normal body habitus. Assistive device:none  Musculoskeletal: gait and station Limp none, muscle tone and strength are normal, no tremors or atrophy is present.  .  Neurological: coordination overall normal.  Deep tendon reflex/nerve stretch intact.  Sensation normal.  Cranial nerves II-XII intact.   Skin:   Normal overall no scars, lesions, ulcers or rashes. No psoriasis.  Psychiatric: Alert and oriented x 3.  Recent memory intact, remote memory unclear.  Normal mood and affect. Well groomed.  Good eye contact.  Cardiovascular: overall no swelling, no varicosities, no edema bilaterally, normal temperatures of the legs and arms, no clubbing, cyanosis and good capillary refill.  Lymphatic: palpation is normal.  Left shoulder is very painful, I did not attempt much ROM as she is hurting so much.  NV intact.  All other systems reviewed and are negative   The patient has been educated about the nature of the problem(s) and counseled on treatment options.  The patient appeared to understand what I have discussed and is in agreement with it.  Encounter Diagnoses  Name Primary?  . Chronic left shoulder pain Yes  . Nicotine dependence, cigarettes, uncomplicated     PLAN Call if any problems.  Precautions discussed.  Continue current medications.   Return to clinic 3 weeks   I have reviewed the [place] web site prior to prescribing narcotic medicine for this patient.   Electronically Signed Sanjuana Kava, MD 12/22/20208:50 AM

## 2019-01-10 ENCOUNTER — Telehealth: Payer: Self-pay | Admitting: Orthopaedic Surgery

## 2019-01-10 NOTE — Telephone Encounter (Signed)
Sandra [name] called this afternoon stating she was told she cannot have an MRI due to having an "implanted box" on the left side of her back that runs to her bladder.  She wants to know what is another option for her?  Please call the patient and advise  Thanks

## 2019-01-11 NOTE — Telephone Encounter (Signed)
Then set up regular arthrogram of shoulder.

## 2019-01-11 NOTE — Telephone Encounter (Signed)
Wanted to clarify. Would you like to order a CT Arthrogram?  Or just the plain arthrogram?

## 2019-01-11 NOTE — Telephone Encounter (Signed)
See below.  Patient cannot have MRI, please advise on what study you would like to order instead?

## 2019-01-12 ENCOUNTER — Other Ambulatory Visit: Payer: Self-pay | Admitting: Orthopaedic Surgery

## 2019-01-12 DIAGNOSIS — G8929 Other chronic pain: Secondary | ICD-10-CM

## 2019-01-12 NOTE — Telephone Encounter (Signed)
CT arthrogram

## 2019-01-16 ENCOUNTER — Telehealth: Payer: Self-pay

## 2019-01-16 MED ORDER — HYDROCODONE-ACETAMINOPHEN 7.5-325 MG PO TABS: 1.0000 | ORAL_TABLET | Freq: Four times a day (QID) | ORAL | 0 refills | Status: AC | PRN

## 2019-01-16 NOTE — Telephone Encounter (Signed)
Oxycodone-Acetaminophen 5/325mg   Qty 25 Tablets  PATIENT USES [facility] @ [address] IN [place], [place]

## 2019-01-16 NOTE — Telephone Encounter (Signed)
CT Arthrogram ordered.

## 2019-01-17 ENCOUNTER — Encounter: Payer: Self-pay | Admitting: Orthopaedic Surgery

## 2019-01-23 ENCOUNTER — Telehealth: Payer: Self-pay

## 2019-01-23 NOTE — Telephone Encounter (Signed)
Patient called stating that her daughter had tested positive for Covid and she was in quarantine for the next ten days. She had spoke to someone else here at the office and told them that she had not had her MRI done yet. I told her that I would be glad to help her in anyway I can. She said she would call us back when she has her MRI done.

## 2019-01-24 ENCOUNTER — Ambulatory Visit: Payer: Medicaid Other | Admitting: Orthopaedic Surgery

## 2019-01-30 ENCOUNTER — Telehealth: Payer: Self-pay

## 2019-01-30 MED ORDER — HYDROCODONE-ACETAMINOPHEN 5-325 MG PO TABS: ORAL_TABLET | ORAL | 0 refills | Status: DC

## 2019-01-30 NOTE — Telephone Encounter (Signed)
Hydrocodone-Acetaminophen 7.5/325mg     PATIENT USES [facility] ON [address] IN [place], [place]

## 2019-02-06 ENCOUNTER — Telehealth: Payer: Self-pay | Admitting: "Endocrinology

## 2019-02-06 ENCOUNTER — Other Ambulatory Visit: Payer: Self-pay | Admitting: Radiology

## 2019-02-06 DIAGNOSIS — E1165 Type 2 diabetes mellitus with hyperglycemia: Secondary | ICD-10-CM

## 2019-02-06 MED ORDER — HYDROCODONE-ACETAMINOPHEN 5-325 MG PO TABS: ORAL_TABLET | ORAL | 0 refills | Status: DC

## 2019-02-06 NOTE — Telephone Encounter (Signed)
Joy, Would you send a referral to a podiatry for this patient ?( reportedly saw Dr. Amalia Hailey before). Thanks.

## 2019-02-06 NOTE — Telephone Encounter (Signed)
Patient called, requested refill hydrocodone 7.5mg . [facility] [address]

## 2019-02-06 NOTE — Telephone Encounter (Signed)
Pt was just diagnosed with neuropathy and is having awful pain in her toes. She was in the Emergency Dept. She is asking for a referral to a foot doctor. She has seen Dr Amalia Hailey before at [facility].

## 2019-02-07 ENCOUNTER — Ambulatory Visit: Payer: Medicaid Other | Admitting: "Endocrinology

## 2019-02-07 NOTE — Telephone Encounter (Signed)
Sent referral to [facility] in [place]

## 2019-02-08 ENCOUNTER — Ambulatory Visit (INDEPENDENT_AMBULATORY_CARE_PROVIDER_SITE_OTHER): Payer: Medicaid Other

## 2019-02-08 ENCOUNTER — Ambulatory Visit (INDEPENDENT_AMBULATORY_CARE_PROVIDER_SITE_OTHER): Payer: Medicaid Other | Admitting: Podiatry

## 2019-02-08 ENCOUNTER — Other Ambulatory Visit: Payer: Self-pay

## 2019-02-08 DIAGNOSIS — M79672 Pain in left foot: Secondary | ICD-10-CM

## 2019-02-08 DIAGNOSIS — M79671 Pain in right foot: Secondary | ICD-10-CM

## 2019-02-08 DIAGNOSIS — E0842 Diabetes mellitus due to underlying condition with diabetic polyneuropathy: Secondary | ICD-10-CM

## 2019-02-08 DIAGNOSIS — E0843 Diabetes mellitus due to underlying condition with diabetic autonomic (poly)neuropathy: Secondary | ICD-10-CM

## 2019-02-08 DIAGNOSIS — G629 Polyneuropathy, unspecified: Secondary | ICD-10-CM

## 2019-02-08 MED ORDER — GABAPENTIN 300 MG PO CAPS: 300.0000 mg | ORAL_CAPSULE | Freq: Three times a day (TID) | ORAL | 3 refills | Status: DC

## 2019-02-10 ENCOUNTER — Other Ambulatory Visit: Payer: Self-pay | Admitting: Podiatry

## 2019-02-10 DIAGNOSIS — E0842 Diabetes mellitus due to underlying condition with diabetic polyneuropathy: Secondary | ICD-10-CM

## 2019-02-11 NOTE — Progress Notes (Signed)
   HPI: 44 y.o. female with PMHx of T2DM presenting today with a chief complaint of intermittent numbness and tingling of the bilateral feet that began about three months ago. She reports associated intermittent pain, swelling and redness of the feet. Walking increases the symptoms. She states she was seen in the ED on 12/17/2018 for similar symptoms and was diagnosed with neuropathy. She was prescribed Gabapentin which she reports taking as directed with no relief. Patient is here for further evaluation and treatment.   Past Medical History:  Diagnosis Date  . ADHD (attention deficit hyperactivity disorder)   . Allergic rhinitis   . Anemia    hx of  . Bipolar 1 disorder ([place])   . Bladder tumor   . Cancer ([place])    bladder  . Dizziness   . Full dentures   . GERD (gastroesophageal reflux disease)   . Gross hematuria   . Hyperlipidemia   . Hypertension   . Type 2 diabetes mellitus ([place])   . Urgency of urination    dysuria, sui  . Wears glasses      Physical Exam: General: The patient is alert and oriented x3 in no acute distress.  Dermatology: Skin is warm, dry and supple bilateral lower extremities. Negative for open lesions or macerations.  Vascular: Palpable pedal pulses bilaterally. No edema or erythema noted. Capillary refill within normal limits.  Neurological: Epicritic and protective threshold diminished bilaterally.   Musculoskeletal Exam: Range of motion within normal limits to all pedal and ankle joints bilateral. Muscle strength 5/5 in all groups bilateral.   Radiographic Exam:  Normal osseous mineralization. Joint spaces preserved. No fracture/dislocation/boney destruction.    Assessment: 1. Type II DM - uncontrolled  2. Peripheral polyneuropathy    Plan of Care:  1. Patient evaluated. X-Rays reviewed.  2. Recommended follow up with PCP to control blood glucose levels.  3. Recommended good shoe gear.  4. Increase Gabapentin to 300 mg TID. Prescription  provided to patient.  5. Return to clinic as needed.       Edrick Kins, DPM [facility]  Dr. Edrick Kins, DPM    [address], [place] [address]                Office [phone]  Fax [phone]

## 2019-02-15 ENCOUNTER — Encounter: Payer: Self-pay | Admitting: "Endocrinology

## 2019-02-15 ENCOUNTER — Other Ambulatory Visit: Payer: Self-pay

## 2019-02-15 ENCOUNTER — Ambulatory Visit (INDEPENDENT_AMBULATORY_CARE_PROVIDER_SITE_OTHER): Payer: Medicaid Other | Admitting: "Endocrinology

## 2019-02-15 VITALS — BP 156/90 | HR 98 | Ht 60.0 in | Wt 155.0 lb

## 2019-02-15 DIAGNOSIS — E1165 Type 2 diabetes mellitus with hyperglycemia: Secondary | ICD-10-CM | POA: Diagnosis not present

## 2019-02-15 DIAGNOSIS — E782 Mixed hyperlipidemia: Secondary | ICD-10-CM | POA: Diagnosis not present

## 2019-02-15 LAB — POCT GLYCOSYLATED HEMOGLOBIN (HGB A1C): Hemoglobin A1C: 12.1 % — AB (ref 4.0–5.6)

## 2019-02-15 MED ORDER — TRESIBA FLEXTOUCH 200 UNIT/ML ~~LOC~~ SOPN: 20.0000 [IU] | PEN_INJECTOR | Freq: Every day | SUBCUTANEOUS | 2 refills | Status: DC

## 2019-02-15 MED ORDER — ACCU-CHEK GUIDE W/DEVICE KIT: 1.0000 | PACK | 0 refills | Status: DC

## 2019-02-15 MED ORDER — ACCU-CHEK GUIDE VI STRP: ORAL_STRIP | 2 refills | Status: DC

## 2019-02-15 MED ORDER — BD PEN NEEDLE SHORT U/F 31G X 8 MM MISC: 1.0000 | 3 refills | Status: DC

## 2019-02-15 NOTE — Patient Instructions (Signed)

## 2019-02-15 NOTE — Progress Notes (Signed)
02/15/2019, 4:42 PM   Endocrinology follow-up note    Subjective:    Patient ID: Sandra [name], female    DOB: 10-02-1975.  Sandra [name] is being seen in follow-up after she was seen in consultation for management of currently uncontrolled symptomatic type 2  diabetes requested by  Joyice Faster, FNP.   Past Medical History:  Diagnosis Date  . ADHD (attention deficit hyperactivity disorder)   . Allergic rhinitis   . Anemia    hx of  . Bipolar 1 disorder ([place])   . Bladder tumor   . Cancer ([place])    bladder  . Dizziness   . Full dentures   . GERD (gastroesophageal reflux disease)   . Gross hematuria   . Hyperlipidemia   . Hypertension   . Type 2 diabetes mellitus ([place])   . Urgency of urination    dysuria, sui  . Wears glasses     Past Surgical History:  Procedure Laterality Date  . CYSTOSCOPY N/A 10/03/2015   Procedure: CYSTOSCOPY;  Surgeon: Irine Seal, MD;  Location: [facility];  Service: Urology;  Laterality: N/A;  . CYSTOSCOPY WITH STENT PLACEMENT Right 10/31/2015   Procedure: CYSTOSCOPY WITH ATTEMPTED RIGHT URETERAL OPENING;  Surgeon: Irine Seal, MD;  Location: [facility];  Service: Urology;  Laterality: Right;  . INTERSTIM IMPLANT PLACEMENT N/A 03/14/2018   Procedure: Barrie Lyme IMPLANT FIRST STAGE;  Surgeon: Cleon Gustin, MD;  Location: [facility];  Service: Urology;  Laterality: N/A;  . INTERSTIM IMPLANT PLACEMENT N/A 03/30/2018   Procedure: Barrie Lyme IMPLANT SECOND STAGE;  Surgeon: Cleon Gustin, MD;  Location: [facility];  Service: Urology;  Laterality: N/A;  . MULTIPLE TOOTH EXTRACTIONS  2015  . OVARIAN CYST REMOVAL Right 2004 approx  . TONSILLECTOMY  12/30/2004  . TOTAL ABDOMINAL HYSTERECTOMY W/ BILATERAL SALPINGOOPHORECTOMY  05/16/2004  . TRANSURETHRAL RESECTION OF BLADDER TUMOR N/A 10/03/2015   Procedure: TRANSURETHRAL RESECTION OF BLADDER  TUMOR (TURBT);  Surgeon: Irine Seal, MD;  Location: [facility];  Service: Urology;  Laterality: N/A;  . TRANSURETHRAL RESECTION OF BLADDER TUMOR N/A 10/31/2015   Procedure: RE-STAGINGG TRANSURETHRAL RESECTION OF BLADDER TUMOR (TURBT);  Surgeon: Irine Seal, MD;  Location: [facility];  Service: Urology;  Laterality: N/A;  . TRANSURETHRAL RESECTION OF BLADDER TUMOR N/A 08/17/2016   Procedure: TRANSURETHRAL RESECTION OF BLADDER TUMOR (TURBT);  Surgeon: Cleon Gustin, MD;  Location: [facility];  Service: Urology;  Laterality: N/A;    Social History   Socioeconomic History  . Marital status: Legally Separated    Spouse name: Not on file  . Number of children: Not on file  . Years of education: Not on file  . Highest education level: Not on file  Occupational History  . Not on file  Tobacco Use  . Smoking status: Current Every Day Smoker    Packs/day: 0.50    Types: Cigarettes  . Smokeless tobacco: Never Used  Substance and Sexual Activity  . Alcohol use: No  . Drug use: No  . Sexual activity: Not Currently    Birth control/protection: Surgical    Comment: hyst  Other Topics Concern  .  Not on file  Social History Narrative  . Not on file   Social Determinants of Health   Financial Resource Strain:   . Difficulty of Paying Living Expenses: Not on file  Food Insecurity:   . Worried About Charity fundraiser in the Last Year: Not on file  . Ran Out of Food in the Last Year: Not on file  Transportation Needs:   . Lack of Transportation (Medical): Not on file  . Lack of Transportation (Non-Medical): Not on file  Physical Activity:   . Days of Exercise per Week: Not on file  . Minutes of Exercise per Session: Not on file  Stress:   . Feeling of Stress : Not on file  Social Connections:   . Frequency of Communication with Friends and Family: Not on file  . Frequency of Social Gatherings with Friends and Family: Not on file  . Attends Religious  Services: Not on file  . Active Member of Clubs or Organizations: Not on file  . Attends Archivist Meetings: Not on file  . Marital Status: Not on file    Family History  Problem Relation Age of Onset  . Hypertension Mother   . Cancer Mother   . Breast cancer Maternal Aunt   . Lung cancer Maternal Uncle   . Cancer Other   . Diabetes Daughter     Outpatient Encounter Medications as of 02/15/2019  Medication Sig  . aspirin-acetaminophen-caffeine (EXCEDRIN MIGRAINE) 250-250-65 MG tablet Take 2 tablets by mouth 3 (three) times daily as needed for migraine.   . Blood Glucose Monitoring Suppl (ACCU-CHEK GUIDE) w/Device KIT 1 Piece by Does not apply route as directed.  . Blood Glucose Monitoring Suppl (ACCU-CHEK GUIDE) w/Device KIT 1 Piece by Does not apply route as directed.  . cyclobenzaprine (FLEXERIL) 5 MG tablet Take 1 tablet (5 mg total) by mouth 3 (three) times daily as needed.  Marland Kitchen FLUoxetine (PROZAC) 20 MG capsule Take 40 mg by mouth at bedtime.  . gabapentin (NEURONTIN) 300 MG capsule Take 1 capsule (300 mg total) by mouth 3 (three) times daily.  Marland Kitchen glipiZIDE (GLUCOTROL XL) 5 MG 24 hr tablet Take 1 tablet (5 mg total) by mouth daily with breakfast.  . glucose blood (ACCU-CHEK GUIDE) test strip Use as instructed  . HYDROcodone-acetaminophen (NORCO/VICODIN) 5-325 MG tablet One tablet every six hours for pain.  Limit 7 days.  . Insulin Degludec (TRESIBA FLEXTOUCH) 200 UNIT/ML SOPN Inject 20 Units into the skin at bedtime.  . Insulin Pen Needle (B-D ULTRAFINE III SHORT PEN) 31G X 8 MM MISC 1 each by Does not apply route as directed.  . metFORMIN (GLUCOPHAGE) 1000 MG tablet Take 1 tablet (1,000 mg total) by mouth 2 (two) times daily with a meal.  . pantoprazole (PROTONIX) 40 MG tablet Take 40 mg by mouth at bedtime.   . predniSONE (DELTASONE) 20 MG tablet Take 2 po QD x 3d starting on Tues Nov 10 then 1 po QD x 4 days  . rosuvastatin (CRESTOR) 20 MG tablet Take 1 tablet (20 mg  total) by mouth daily.  . [DISCONTINUED] glucose blood (ACCU-CHEK GUIDE) test strip Use as instructed  . [DISCONTINUED] Insulin Glargine (LANTUS SOLOSTAR) 100 UNIT/ML Solostar Pen Inject 1 Units into the skin daily.   Facility-Administered Encounter Medications as of 02/15/2019  Medication  . betamethasone acetate-betamethasone sodium phosphate (CELESTONE) injection 3 mg    ALLERGIES: Allergies  Allergen Reactions  . Benazepril Hives  . Ibuprofen Hives  itching  . Metronidazole Nausea And Vomiting  . Oxycodone     Pt has tolerated hydromorphone in the past and takes Norco at home.  . Adhesive [Tape] Rash  . Codeine Nausea And Vomiting and Rash  . Loratadine Rash  . Nystatin-Triamcinolone Rash  . Penicillins Rash    Has patient had a PCN reaction causing immediate rash, facial/tongue/throat swelling, SOB or lightheadedness with hypotension: No Has patient had a PCN reaction causing severe rash involving mucus membranes or skin necrosis: Yes Has patient had a PCN reaction that required hospitalization: No Has patient had a PCN reaction occurring within the last 10 years: no  If all of the above answers are "NO", then may proceed with Cephalosporin use.    VACCINATION STATUS: Immunization History  Administered Date(s) Administered  . Influenza,inj,Quad PF,6+ Mos 11/01/2015    Diabetes She presents for her follow-up diabetic visit. She has type 2 diabetes mellitus. Onset time: She was diagnosed at approximate age of 55 years with A1c of greater than 15% Her disease course has been worsening (She was initiated on Lantus, metformin.  She admits that she has not been consistent taking her Lantus, however she has seen improvement in her glycemic profile.). Pertinent negatives for hypoglycemia include no confusion, headaches, pallor or seizures. Associated symptoms include foot paresthesias, polydipsia and polyuria. Pertinent negatives for diabetes include no blurred vision, no chest  pain, no fatigue and no polyphagia. Symptoms are worsening. Risk factors for coronary artery disease include dyslipidemia, diabetes mellitus, hypertension, sedentary lifestyle and tobacco exposure. Current diabetic treatments: Metformin 1000 mg p.o. twice daily. Her weight is increasing steadily. She is following a generally unhealthy diet. When asked about meal planning, she reported none. She has not had a previous visit with a dietitian. She rarely participates in exercise. Her overall blood glucose range is >200 mg/dl. (She is in randomly monitor blood glucose profile, significantly above target postprandial readings.  Her point-of-care A1c is 12.1%, increasing from 11.2%.  She did have A1c of greater than 15% last year.   ) An ACE inhibitor/angiotensin II receptor blocker is not being taken.  Hyperlipidemia This is a chronic problem. The problem is uncontrolled. Exacerbating diseases include diabetes. Pertinent negatives include no chest pain, myalgias or shortness of breath. She is currently on no antihyperlipidemic treatment. Risk factors for coronary artery disease include diabetes mellitus, dyslipidemia, a sedentary lifestyle and family history.  Hypertension This is a chronic problem. The problem is uncontrolled. Pertinent negatives include no blurred vision, chest pain, headaches, palpitations or shortness of breath. Risk factors for coronary artery disease include dyslipidemia and diabetes mellitus. Past treatments include nothing.    Review of systems: Limited as above. Objective:    BP (!) 156/90   Pulse 98   Ht 5' (1.524 m)   Wt 155 lb (70.3 kg)   BMI 30.27 kg/m   Wt Readings from Last 3 Encounters:  02/15/19 155 lb (70.3 kg)  01/03/19 154 lb 8 oz (70.1 kg)  12/16/18 149 lb (67.6 kg)       CMP ( most recent) CMP     Component Value Date/Time   NA 130 (L) 12/17/2018 0020   K 4.0 12/17/2018 0020   CL 93 (L) 12/17/2018 0020   CO2 23 12/17/2018 0020   GLUCOSE 630 (HH)  12/17/2018 0020   BUN 11 12/17/2018 0020   BUN 9 02/24/2018 0000   CREATININE 0.75 12/17/2018 0020   CALCIUM 9.7 12/17/2018 0020   PROT 6.8 02/24/2018 1923  ALBUMIN 3.4 (L) 02/24/2018 1923   AST 11 (L) 02/24/2018 1923   ALT 16 02/24/2018 1923   ALKPHOS 127 (H) 02/24/2018 1923   BILITOT 0.4 02/24/2018 1923   GFRNONAA >60 12/17/2018 0020   GFRAA >60 12/17/2018 0020     Diabetic Labs (most recent): Lab Results  Component Value Date   HGBA1C 12.1 (A) 02/15/2019   HGBA1C 11.2 (A) 10/10/2018   HGBA1C 15 02/24/2018     Assessment & Plan:   1. Uncontrolled type 2 diabetes mellitus with hyperglycemia ([place])  - Sandra [name] has currently uncontrolled symptomatic type 2 DM since  44 years of age.  -She is presenting with persistent severe hyperglycemia, A1c of 12.1% at point-of-care today.   - I had a long discussion with her about the progressive nature of diabetes and the pathology behind its complications. -her diabetes is complicated by chronic smoking and she remains at a high risk for more acute and chronic complications which include CAD, CVA, CKD, retinopathy, and neuropathy. These are all discussed in detail with her.  - I have counseled her on diet  and weight management  by adopting a carbohydrate restricted/protein rich diet. Patient is encouraged to switch to  unprocessed or minimally processed     complex starch and increased protein intake (animal or plant source), fruits, and vegetables. -  she is advised to stick to a routine mealtimes to eat 3 meals  a day and avoid unnecessary snacks ( to snack only to correct hypoglycemia).   - she  admits there is a room for improvement in her diet and drink choices. -  Suggestion is made for her to avoid simple carbohydrates  from her diet including Cakes, Sweet Desserts / Pastries, Ice Cream, Soda (diet and regular), Sweet Tea, Candies, Chips, Cookies, Sweet Pastries,  Store Bought Juices, Alcohol in Excess of  1-2 drinks a day,  Artificial Sweeteners, Coffee Creamer, and "Sugar-free" Products. This will help patient to have stable blood glucose profile and potentially avoid unintended weight gain.   - she will be scheduled with Jearld Fenton, RDN, CDE for diabetes education.  - I have approached her with the following individualized plan to manage  her diabetes and patient agrees:   -Based on her presentation with severe and persistent hyperglycemia, she is being considered for basal insulin.  She hesitantly accepts.    -I discussed and initiated Tresiba 20 units nightly, associated with strict monitoring of blood glucose 4 times a day-before meals and at bedtime and return in 1 week for reevaluation.   -She is encouraged to call clinic for readings below 70 or greater than 300 mg per DL x3. -She has used insulin Lantus Solostar before, however, insulin use is redemonstrated for her in the exam room. - she is advised to continue metformin 1000 mg p.o. twice daily, therapeutically suitable for patient . -She has benefited from low-dose glipizide therapy.  She is advised to continue  glipizide 5 mg XL p.o. at breakfast.   - she is not a suitable candidate for incretin therapy, she will need fasting lipid profile on subsequent visits.  - Specific targets for  A1c;  LDL, HDL, Triglycerides, and  Waist Circumference were discussed with the patient.  2) Blood Pressure /Hypertension: Her blood pressure is above target.  She is allergic to ACE inhibitors. she is a chronic heavy smoker, counseled  on smoking cessation. She will be considered for medication intervention next visit.   3) Lipids/Hyperlipidemia: No recent lipid panel  to review.  Given her periorbital xanthelasma, she is advised to continue Crestor 20 mg p.o. nightly.     Side effects and precautions discussed with her.  4)  Weight/Diet: She has a BMI of 30.5-      she is  a candidate for weight loss. I discussed with her the fact that loss of 5 - 10% of her   current body weight will have the most impact on her diabetes management.  Exercise, and detailed carbohydrates information provided  -  detailed on discharge instructions.  5) Chronic Care/Health Maintenance:  -she  Is not on ACEI/ARB and Statin medications and  is encouraged to initiate and continue to follow up with Ophthalmology, Dentist,  Podiatrist at least yearly or according to recommendations, and advised to  Quit smoking. I have recommended yearly flu vaccine and pneumonia vaccine at least every 5 years; moderate intensity exercise for up to 150 minutes weekly; and  sleep for at least 7 hours a day.  - she is  advised to maintain close follow up with Joyice Faster, FNP for primary care needs, as well as her other providers for optimal and coordinated care.  - Time spent on this patient care encounter:  35 min, of which > 50% was spent in  counseling and the rest reviewing her blood glucose logs , discussing her hypoglycemia and hyperglycemia episodes, reviewing her current and  previous labs / studies  ( including abstraction from other facilities) and medications  doses and developing a  long term treatment plan and documenting her care.   Please refer to Patient Instructions for Blood Glucose Monitoring and Insulin/Medications Dosing Guide"  in media tab for additional information. Please  also refer to " Patient Self Inventory" in the Media  tab for reviewed elements of pertinent patient history.  Sandra [name] participated in the discussions, expressed understanding, and voiced agreement with the above plans.  All questions were answered to her satisfaction. she is encouraged to contact clinic should she have any questions or concerns prior to her return visit.    Follow up plan: - Return in about 10 days (around 02/25/2019), or office, for Follow up with Meter and Logs Only - no Labs.  Glade Lloyd, MD [facility] [address] [place], [place] [address] Phone: [phone]  Fax: [phone]    02/15/2019, 4:42 PM  This note was partially dictated with voice recognition software. Similar sounding words can be transcribed inadequately or may not  be corrected upon review.

## 2019-02-17 ENCOUNTER — Ambulatory Visit (HOSPITAL_COMMUNITY)
Admission: RE | Admit: 2019-02-17 | Discharge: 2019-02-17 | Disposition: A | Discharge status: Home / Self Care | Payer: Medicaid Other | Source: Ambulatory Visit | Attending: Orthopaedic Surgery | Admitting: Orthopaedic Surgery

## 2019-02-17 ENCOUNTER — Other Ambulatory Visit: Payer: Self-pay

## 2019-02-17 ENCOUNTER — Other Ambulatory Visit: Payer: Self-pay | Admitting: Orthopaedic Surgery

## 2019-02-17 ENCOUNTER — Telehealth: Payer: Self-pay | Admitting: Orthopaedic Surgery

## 2019-02-17 DIAGNOSIS — G8929 Other chronic pain: Secondary | ICD-10-CM | POA: Insufficient documentation

## 2019-02-17 DIAGNOSIS — M25512 Pain in left shoulder: Secondary | ICD-10-CM

## 2019-02-17 DIAGNOSIS — M75122 Complete rotator cuff tear or rupture of left shoulder, not specified as traumatic: Secondary | ICD-10-CM | POA: Insufficient documentation

## 2019-02-17 MED ORDER — LIDOCAINE HCL (PF) 1 % IJ SOLN
INTRAMUSCULAR | Status: AC
  Administered 2019-02-17: 5 mL
  Filled 2019-02-17: qty 5

## 2019-02-17 MED ORDER — POVIDONE-IODINE 10 % EX SOLN
CUTANEOUS | Status: AC
  Administered 2019-02-17: 1
  Filled 2019-02-17: qty 15

## 2019-02-17 MED ORDER — SODIUM CHLORIDE (PF) 0.9 % IJ SOLN
INTRAMUSCULAR | Status: AC
  Administered 2019-02-17: 10 mL
  Filled 2019-02-17: qty 10

## 2019-02-17 MED ORDER — IOHEXOL 300 MG/ML  SOLN
50.0000 mL | Freq: Once | INTRAMUSCULAR | Status: AC | PRN
  Administered 2019-02-17: 5 mL via INTRA_ARTICULAR

## 2019-02-17 NOTE — Telephone Encounter (Signed)
Hydrocodone-Acetaminophen 7.5/325mg     PATIENT USES [facility] ON [address] IN [place], [place]

## 2019-02-17 NOTE — Procedures (Signed)
Preprocedure Dx: chronic left shoulder pain Postprocedure Dx: chronic left shoulder pain Procedure  Fluoroscopically guided LT shoulder joint injection for CT arthrogrpahy Radiologist:  Thornton Papas Anesthesia:  5 ml of 1% lidocaine Injectate:  10 ml of [5 ml Omni300 and 31ml saline] Fluoro time:  2 minutes 0 seconds EBL:   None Complications: None

## 2019-02-20 MED ORDER — HYDROCODONE-ACETAMINOPHEN 5-325 MG PO TABS: ORAL_TABLET | ORAL | 0 refills | Status: DC

## 2019-02-20 NOTE — Telephone Encounter (Signed)
Noted. Called patient. Aware.

## 2019-02-21 ENCOUNTER — Other Ambulatory Visit: Payer: Self-pay

## 2019-02-21 ENCOUNTER — Ambulatory Visit: Payer: Medicaid Other | Admitting: Orthopaedic Surgery

## 2019-02-21 ENCOUNTER — Encounter: Payer: Self-pay | Admitting: Orthopaedic Surgery

## 2019-02-21 VITALS — BP 160/99 | HR 85 | Ht 60.0 in | Wt 154.0 lb

## 2019-02-21 DIAGNOSIS — M25512 Pain in left shoulder: Secondary | ICD-10-CM | POA: Diagnosis not present

## 2019-02-21 DIAGNOSIS — G8929 Other chronic pain: Secondary | ICD-10-CM

## 2019-02-21 DIAGNOSIS — F17210 Nicotine dependence, cigarettes, uncomplicated: Secondary | ICD-10-CM | POA: Diagnosis not present

## 2019-02-21 NOTE — Progress Notes (Signed)
Patient Sandra [name], female DOB:[birth year], 44 y.o. GTX:[id number]  Chief Complaint  Patient presents with  . Shoulder Pain    left/ has severe pain     HPI  Sandra [name] is a 44 y.o. female who has pain of the left shoulder that has not gotten better.  She had MRI which showed: IMPRESSION: 1. Full-thickness tear of the anterior aspect of the distal supraspinatus tendon with approximately 11 mm of tendinous retraction. 2. Full-thickness tearing of the superior aspect of the distal subscapularis tendon.  I have independently reviewed the MRI of the shoulder and concur with the report.  I have explained the findings to her.  I recommend consideration for surgery.  I have briefly described the procedure to her.  I will have her see Dr. Aline Brochure for more definitive discussion and possible planning.   Body mass index is 30.08 kg/m.  ROS  Review of Systems  Constitutional: Positive for activity change.  Musculoskeletal: Positive for arthralgias.  Allergic/Immunologic: Positive for environmental allergies and food allergies.  All other systems reviewed and are negative.   All other systems reviewed and are negative.  The following is a summary of the past history medically, past history surgically, known current medicines, social history and family history.  This information is gathered electronically by the computer from prior information and documentation.  I review this each visit and have found including this information at this point in the chart is beneficial and informative.    Past Medical History:  Diagnosis Date  . ADHD (attention deficit hyperactivity disorder)   . Allergic rhinitis   . Anemia    hx of  . Bipolar 1 disorder ([place])   . Bladder tumor   . Cancer ([place])    bladder  . Dizziness   . Full dentures   . GERD (gastroesophageal reflux disease)   . Gross hematuria   . Hyperlipidemia   . Hypertension   . Type 2 diabetes mellitus ([place])   . Urgency  of urination    dysuria, sui  . Wears glasses     Past Surgical History:  Procedure Laterality Date  . CYSTOSCOPY N/A 10/03/2015   Procedure: CYSTOSCOPY;  Surgeon: Irine Seal, MD;  Location[facility];  Service: Urology;  Laterality: N/A;  . CYSTOSCOPY WITH STENT PLACEMENT Right 10/31/2015   Procedure: CYSTOSCOPY WITH ATTEMPTED RIGHT URETERAL OPENING;  Surgeon: Irine Seal, MD;  Location: [facility];  Service: Urology;  Laterality: Right;  . INTERSTIM IMPLANT PLACEMENT N/A 03/14/2018   Procedure: Barrie Lyme IMPLANT FIRST STAGE;  Surgeon: Cleon Gustin, MD;  Location: [facility];  Service: Urology;  Laterality: N/A;  . INTERSTIM IMPLANT PLACEMENT N/A 03/30/2018   Procedure: Barrie Lyme IMPLANT SECOND STAGE;  Surgeon: Cleon Gustin, MD;  Location: [facility];  Service: Urology;  Laterality: N/A;  . MULTIPLE TOOTH EXTRACTIONS  2015  . OVARIAN CYST REMOVAL Right 2004 approx  . TONSILLECTOMY  12/30/2004  . TOTAL ABDOMINAL HYSTERECTOMY W/ BILATERAL SALPINGOOPHORECTOMY  05/16/2004  . TRANSURETHRAL RESECTION OF BLADDER TUMOR N/A 10/03/2015   Procedure: TRANSURETHRAL RESECTION OF BLADDER TUMOR (TURBT);  Surgeon: Irine Seal, MD;  Location: [facility];  Service: Urology;  Laterality: N/A;  . TRANSURETHRAL RESECTION OF BLADDER TUMOR N/A 10/31/2015   Procedure: RE-STAGINGG TRANSURETHRAL RESECTION OF BLADDER TUMOR (TURBT);  Surgeon: Irine Seal, MD;  Location: [facility];  Service: Urology;  Laterality: N/A;  . TRANSURETHRAL RESECTION OF BLADDER TUMOR N/A 08/17/2016   Procedure: TRANSURETHRAL  RESECTION OF BLADDER TUMOR (TURBT);  Surgeon: Cleon Gustin, MD;  Location: [facility];  Service: Urology;  Laterality: N/A;    Family History  Problem Relation Age of Onset  . Hypertension Mother   . Cancer Mother   . Breast cancer Maternal Aunt   . Lung cancer Maternal Uncle   . Cancer Other   . Diabetes Daughter     Social History Social  History   Tobacco Use  . Smoking status: Current Every Day Smoker    Packs/day: 0.50    Types: Cigarettes  . Smokeless tobacco: Never Used  Substance Use Topics  . Alcohol use: No  . Drug use: No    Allergies  Allergen Reactions  . Benazepril Hives  . Ibuprofen Hives    itching  . Metronidazole Nausea And Vomiting  . Oxycodone     Pt has tolerated hydromorphone in the past and takes Norco at home.  . Adhesive [Tape] Rash  . Codeine Nausea And Vomiting and Rash  . Loratadine Rash  . Nystatin-Triamcinolone Rash  . Penicillins Rash    Has patient had a PCN reaction causing immediate rash, facial/tongue/throat swelling, SOB or lightheadedness with hypotension: No Has patient had a PCN reaction causing severe rash involving mucus membranes or skin necrosis: Yes Has patient had a PCN reaction that required hospitalization: No Has patient had a PCN reaction occurring within the last 10 years: no  If all of the above answers are "NO", then may proceed with Cephalosporin use.    Current Outpatient Medications  Medication Sig Dispense Refill  . aspirin-acetaminophen-caffeine (EXCEDRIN MIGRAINE) 250-250-65 MG tablet Take 2 tablets by mouth 3 (three) times daily as needed for migraine.     . Blood Glucose Monitoring Suppl (ACCU-CHEK GUIDE) w/Device KIT 1 Piece by Does not apply route as directed. 1 kit 0  . Blood Glucose Monitoring Suppl (ACCU-CHEK GUIDE) w/Device KIT 1 Piece by Does not apply route as directed. 1 kit 0  . cloNIDine (CATAPRES) 0.1 MG tablet Take 0.1 mg by mouth 2 (two) times daily.    . cyclobenzaprine (FLEXERIL) 5 MG tablet Take 1 tablet (5 mg total) by mouth 3 (three) times daily as needed. 30 tablet 0  . FLUoxetine (PROZAC) 20 MG capsule Take 40 mg by mouth at bedtime.    . gabapentin (NEURONTIN) 300 MG capsule Take 1 capsule (300 mg total) by mouth 3 (three) times daily. 90 capsule 3  . glipiZIDE (GLUCOTROL XL) 5 MG 24 hr tablet Take 1 tablet (5 mg total) by mouth  daily with breakfast. 30 tablet 3  . glucose blood (ACCU-CHEK GUIDE) test strip Use as instructed 150 each 2  . HYDROcodone-acetaminophen (NORCO/VICODIN) 5-325 MG tablet One tablet every six hours for pain.  Limit 7 days. 15 tablet 0  . Insulin Degludec (TRESIBA FLEXTOUCH) 200 UNIT/ML SOPN Inject 20 Units into the skin at bedtime. 2 pen 2  . Insulin Pen Needle (B-D ULTRAFINE III SHORT PEN) 31G X 8 MM MISC 1 each by Does not apply route as directed. 100 each 3  . metFORMIN (GLUCOPHAGE) 1000 MG tablet Take 1 tablet (1,000 mg total) by mouth 2 (two) times daily with a meal. 60 tablet 3  . pantoprazole (PROTONIX) 40 MG tablet Take 40 mg by mouth at bedtime.   2  . rosuvastatin (CRESTOR) 20 MG tablet Take 1 tablet (20 mg total) by mouth daily. 30 tablet 3   Current Facility-Administered Medications  Medication Dose Route Frequency Provider Last Rate Last  Admin  . betamethasone acetate-betamethasone sodium phosphate (CELESTONE) injection 3 mg  3 mg Intramuscular Once Edrick Kins, DPM         Physical Exam  Blood pressure (!) 160/99, pulse 85, height 5' (1.524 m), weight 154 lb (69.9 kg).  Constitutional: overall normal hygiene, normal nutrition, well developed, normal grooming, normal body habitus. Assistive device:none  Musculoskeletal: gait and station Limp none, muscle tone and strength are normal, no tremors or atrophy is present.  .  Neurological: coordination overall normal.  Deep tendon reflex/nerve stretch intact.  Sensation normal.  Cranial nerves II-XII intact.   Skin:   Normal overall no scars, lesions, ulcers or rashes. No psoriasis.  Psychiatric: Alert and oriented x 3.  Recent memory intact, remote memory unclear.  Normal mood and affect. Well groomed.  Good eye contact.  Cardiovascular: overall no swelling, no varicosities, no edema bilaterally, normal temperatures of the legs and arms, no clubbing, cyanosis and good capillary refill.  Lymphatic: palpation is  normal.  Left shoulder is very tender today and she prefers not to use it much and declines examination.  She says she knows the results of the MRI.  All other systems reviewed and are negative   The patient has been educated about the nature of the problem(s) and counseled on treatment options.  The patient appeared to understand what I have discussed and is in agreement with it.  Encounter Diagnoses  Name Primary?  . Chronic left shoulder pain Yes  . Nicotine dependence, cigarettes, uncomplicated     PLAN Call if any problems.  Precautions discussed.  Continue current medications.   Return to clinic to see Dr. Aline Brochure.   Electronically Signed Sanjuana Kava, MD 2/9/202110:55 AM

## 2019-02-28 ENCOUNTER — Other Ambulatory Visit: Payer: Self-pay

## 2019-02-28 ENCOUNTER — Ambulatory Visit: Payer: Medicaid Other | Admitting: Orthopedic Surgery

## 2019-02-28 ENCOUNTER — Ambulatory Visit: Payer: Medicaid Other | Admitting: "Endocrinology

## 2019-02-28 ENCOUNTER — Ambulatory Visit (INDEPENDENT_AMBULATORY_CARE_PROVIDER_SITE_OTHER): Payer: Medicaid Other | Admitting: "Endocrinology

## 2019-02-28 ENCOUNTER — Encounter: Payer: Self-pay | Admitting: "Endocrinology

## 2019-02-28 VITALS — BP 130/84 | HR 83 | Temp 96.8°F | Ht 59.0 in | Wt 150.0 lb

## 2019-02-28 DIAGNOSIS — E782 Mixed hyperlipidemia: Secondary | ICD-10-CM | POA: Diagnosis not present

## 2019-02-28 DIAGNOSIS — M75122 Complete rotator cuff tear or rupture of left shoulder, not specified as traumatic: Secondary | ICD-10-CM

## 2019-02-28 DIAGNOSIS — Z720 Tobacco use: Secondary | ICD-10-CM

## 2019-02-28 DIAGNOSIS — E1165 Type 2 diabetes mellitus with hyperglycemia: Secondary | ICD-10-CM

## 2019-02-28 MED ORDER — HYDROCODONE-ACETAMINOPHEN 5-325 MG PO TABS: ORAL_TABLET | ORAL | 0 refills | Status: DC

## 2019-02-28 MED ORDER — TRESIBA FLEXTOUCH 200 UNIT/ML ~~LOC~~ SOPN: 30.0000 [IU] | PEN_INJECTOR | Freq: Every day | SUBCUTANEOUS | 2 refills | Status: DC

## 2019-02-28 NOTE — Progress Notes (Signed)
PREOP CONSULT/REFERRAL INTRA-OFFICE FROM DR Tyrone Apple   Chief Complaint  Patient presents with  . Shoulder Pain    Left shoulder pain, consult from Dr. Luna Glasgow    44 year old female presents for left rotator cuff repair history of rotator cuff pain shoulder pain unresponsive to nonoperative measures.  She had a CT arthrogram which shows she has a rotator cuff tear supraspinatus tendon  She complains of severe left shoulder pain seems to be located primarily around the deltoid and acromion radiating in the upper arm associated with weakness pain at night and loss of function terms of lifting anything away from her body   Review of Systems  Respiratory: Negative for shortness of breath.   Cardiovascular: Negative for chest pain.   The patient has a bladder stimulator    Past Medical History:  Diagnosis Date  . ADHD (attention deficit hyperactivity disorder)   . Allergic rhinitis   . Anemia    hx of  . Bipolar 1 disorder ([place])   . Bladder tumor   . Cancer ([place])    bladder  . Dizziness   . Full dentures   . GERD (gastroesophageal reflux disease)   . Gross hematuria   . Hyperlipidemia   . Hypertension   . Type 2 diabetes mellitus ([place])   . Urgency of urination    dysuria, sui  . Wears glasses     Past Surgical History:  Procedure Laterality Date  . CYSTOSCOPY N/A 10/03/2015   Procedure: CYSTOSCOPY;  Surgeon: Irine Seal, MD;  Location: [facility];  Service: Urology;  Laterality: N/A;  . CYSTOSCOPY WITH STENT PLACEMENT Right 10/31/2015   Procedure: CYSTOSCOPY WITH ATTEMPTED RIGHT URETERAL OPENING;  Surgeon: Irine Seal, MD;  Location: [facility];  Service: Urology;  Laterality: Right;  . INTERSTIM IMPLANT PLACEMENT N/A 03/14/2018   Procedure: Barrie Lyme IMPLANT FIRST STAGE;  Surgeon: Cleon Gustin, MD;  Location: [facility];  Service: Urology;  Laterality: N/A;  . INTERSTIM IMPLANT PLACEMENT N/A 03/30/2018   Procedure: Barrie Lyme IMPLANT  SECOND STAGE;  Surgeon: Cleon Gustin, MD;  Location: [facility];  Service: Urology;  Laterality: N/A;  . MULTIPLE TOOTH EXTRACTIONS  2015  . OVARIAN CYST REMOVAL Right 2004 approx  . TONSILLECTOMY  12/30/2004  . TOTAL ABDOMINAL HYSTERECTOMY W/ BILATERAL SALPINGOOPHORECTOMY  05/16/2004  . TRANSURETHRAL RESECTION OF BLADDER TUMOR N/A 10/03/2015   Procedure: TRANSURETHRAL RESECTION OF BLADDER TUMOR (TURBT);  Surgeon: Irine Seal, MD;  Location: [facility];  Service: Urology;  Laterality: N/A;  . TRANSURETHRAL RESECTION OF BLADDER TUMOR N/A 10/31/2015   Procedure: RE-STAGINGG TRANSURETHRAL RESECTION OF BLADDER TUMOR (TURBT);  Surgeon: Irine Seal, MD;  Location: [facility];  Service: Urology;  Laterality: N/A;  . TRANSURETHRAL RESECTION OF BLADDER TUMOR N/A 08/17/2016   Procedure: TRANSURETHRAL RESECTION OF BLADDER TUMOR (TURBT);  Surgeon: Cleon Gustin, MD;  Location: [facility];  Service: Urology;  Laterality: N/A;    Family History  Problem Relation Age of Onset  . Hypertension Mother   . Cancer Mother   . Breast cancer Maternal Aunt   . Lung cancer Maternal Uncle   . Cancer Other   . Diabetes Daughter    Social History   Tobacco Use  . Smoking status: Current Every Day Smoker    Packs/day: 0.50    Types: Cigarettes  . Smokeless tobacco: Never Used  Substance Use Topics  . Alcohol use: No  . Drug use: No    Allergies  Allergen  Reactions  . Benazepril Hives  . Ibuprofen Hives    itching  . Metronidazole Nausea And Vomiting  . Oxycodone     Pt has tolerated hydromorphone in the past and takes Norco at home.  . Adhesive [Tape] Rash  . Codeine Nausea And Vomiting and Rash  . Loratadine Rash  . Nystatin-Triamcinolone Rash  . Penicillins Rash    Has patient had a PCN reaction causing immediate rash, facial/tongue/throat swelling, SOB or lightheadedness with hypotension: No Has patient had a PCN reaction causing severe rash involving mucus  membranes or skin necrosis: Yes Has patient had a PCN reaction that required hospitalization: No Has patient had a PCN reaction occurring within the last 10 years: no  If all of the above answers are "NO", then may proceed with Cephalosporin use.     Current Meds  Medication Sig  . aspirin-acetaminophen-caffeine (EXCEDRIN MIGRAINE) 250-250-65 MG tablet Take 2 tablets by mouth 3 (three) times daily as needed for migraine.   . Blood Glucose Monitoring Suppl (ACCU-CHEK GUIDE) w/Device KIT 1 Piece by Does not apply route as directed.  . Blood Glucose Monitoring Suppl (ACCU-CHEK GUIDE) w/Device KIT 1 Piece by Does not apply route as directed.  . cloNIDine (CATAPRES) 0.1 MG tablet Take 0.1 mg by mouth 2 (two) times daily.  . cyclobenzaprine (FLEXERIL) 5 MG tablet Take 1 tablet (5 mg total) by mouth 3 (three) times daily as needed.  Marland Kitchen FLUoxetine (PROZAC) 20 MG capsule Take 40 mg by mouth at bedtime.  . gabapentin (NEURONTIN) 300 MG capsule Take 1 capsule (300 mg total) by mouth 3 (three) times daily.  Marland Kitchen glipiZIDE (GLUCOTROL XL) 5 MG 24 hr tablet Take 1 tablet (5 mg total) by mouth daily with breakfast.  . glucose blood (ACCU-CHEK GUIDE) test strip Use as instructed  . HYDROcodone-acetaminophen (NORCO/VICODIN) 5-325 MG tablet One tablet every six hours for pain.  Limit 7 days.  . Insulin Degludec (TRESIBA FLEXTOUCH) 200 UNIT/ML SOPN Inject 30 Units into the skin at bedtime.  . Insulin Pen Needle (B-D ULTRAFINE III SHORT PEN) 31G X 8 MM MISC 1 each by Does not apply route as directed.  . metFORMIN (GLUCOPHAGE) 1000 MG tablet Take 1 tablet (1,000 mg total) by mouth 2 (two) times daily with a meal.  . pantoprazole (PROTONIX) 40 MG tablet Take 40 mg by mouth at bedtime.   . rosuvastatin (CRESTOR) 20 MG tablet Take 1 tablet (20 mg total) by mouth daily.  . [DISCONTINUED] HYDROcodone-acetaminophen (NORCO/VICODIN) 5-325 MG tablet One tablet every six hours for pain.  Limit 7 days.   Current  Facility-Administered Medications for the 02/28/19 encounter (Office Visit) with Carole Civil, MD  Medication  . betamethasone acetate-betamethasone sodium phosphate (CELESTONE) injection 3 mg    BP 130/84   Pulse 83   Temp (!) 96.8 F (36 C)   Ht '4\' 11"'  (1.499 m)   Wt 150 lb (68 kg)   BMI 30.30 kg/m   Physical Exam Vitals and nursing note reviewed.  Constitutional:      Appearance: Normal appearance.  Neurological:     Mental Status: She is alert and oriented to person, place, and time.  Psychiatric:        Mood and Affect: Mood normal.     Right Shoulder Exam   Range of Motion  The patient has normal right shoulder ROM.  Muscle Strength  The patient has normal right shoulder strength.  Other  Erythema: absent Scars: absent Sensation: normal Pulse: present  Left Shoulder Exam   Tenderness  Left shoulder tenderness location: deltoid.  Range of Motion  Active abduction: abnormal  Passive abduction: abnormal  Extension: 20  External rotation: 40  Forward flexion: 90   Muscle Strength  Abduction: 4/5  Internal rotation: 5/5  External rotation: 5/5  Subscapularis: 5/5  Biceps: 5/5   Other  Erythema: absent Scars: absent Sensation: normal Pulse: present   Comments:  Apprehension could not assess cross chest crossarm could not assess drop arm could not get the arm past 90 degrees.  Sulcus sign normal      MEDICAL DECISION SECTION    My independent reading of xrays: CT arthrogram shows a rotator cuff tear and the x-rays show no glenohumeral arthritis   Encounter Diagnoses  Name Primary?  . Tobacco use Yes  . Complete tear of left rotator cuff, unspecified whether traumatic      PLAN:   Surgical procedure planned: Arthroscopy and rotator cuff repair left shoulder however  This patient has extreme for glucose management she has had hemoglobin A1c's in 12 and 13 and glucoses in the 300-600 range  This puts her at increased risk of  failed cuff repair.  She says she has not had any physical therapy I do not know she can tolerate it based on amount of pain she exhibits.  She is at high risk for extended opioid use based on her record.  And she is on Xanax type medication and she has been on hydrocodone now for over 2 months and previously on oxycodone  So we are going to wait and I will see her in 4 weeks I sent a letter to her doctor to try to get her glucose under control  Also history of smoking  The procedure has been fully reviewed with the patient; The risks and benefits of surgery have been discussed and explained and understood. Alternative treatment has also been reviewed, questions were encouraged and answered. The postoperative plan is also been reviewed.  Nonsurgical treatment as described in the history and physical section was attempted and unsuccessful and the patient has agreed to proceed with surgical intervention to improve their situation.  Meds ordered this encounter  Medications  . HYDROcodone-acetaminophen (NORCO/VICODIN) 5-325 MG tablet    Sig: One tablet every six hours for pain.  Limit 7 days.    Dispense:  15 tablet    Refill:  0    Arther Abbott, MD 02/28/2019 3:20 PM    Status:  Final result Visible to patient:  Yes (MyChart) Next appt:  03/10/2019 at 08:30 AM in Endocrinology Glade Lloyd, MD) Dx:  Uncontrolled type 2 diabetes mellitus...  Ref Range & Units 13 d ago 4 mo ago 1 yr ago  Hemoglobin A1C 4.0 - 5.6 % 12.1Abnormal   11.2Abnormal   15 R, CM

## 2019-02-28 NOTE — Patient Instructions (Signed)

## 2019-02-28 NOTE — Patient Instructions (Signed)
You need to get your sugar under better control   You will need surgery on the shoulder to repair the rotator cuff

## 2019-02-28 NOTE — Progress Notes (Signed)
02/28/2019, 2:21 PM                                                      Endocrinology Telehealth Visit Follow up Note -During COVID -19 Pandemic  This visit type was conducted due to national recommendations for restrictions regarding the COVID-19 Pandemic  in an effort to limit this patient's exposure and mitigate transmission of the corona virus.  Due to her co-morbid illnesses, Magdalyn L [name] is at  moderate to high risk for complications without adequate follow up.  This format is felt to be most appropriate for her at this time.  I connected with this patient on 02/28/2019   by telephone and verified that I am speaking with the correct person using two identifiers. Sandra [name], July 17, 1975. she has verbally consented to this visit. All issues noted in this document were discussed and addressed. The format was not optimal for physical exam.    Subjective:    Patient ID: Sandra [name], female    DOB: 09-03-1975.  Sandra [name] is being engaged in telehealth via telephone in follow-up after she was seen in consultation for management of currently uncontrolled symptomatic type 2  diabetes requested by  Joyice Faster, FNP.   Past Medical History:  Diagnosis Date  . ADHD (attention deficit hyperactivity disorder)   . Allergic rhinitis   . Anemia    hx of  . Bipolar 1 disorder ([place])   . Bladder tumor   . Cancer ([place])    bladder  . Dizziness   . Full dentures   . GERD (gastroesophageal reflux disease)   . Gross hematuria   . Hyperlipidemia   . Hypertension   . Type 2 diabetes mellitus ([place])   . Urgency of urination    dysuria, sui  . Wears glasses     Past Surgical History:  Procedure Laterality Date  . CYSTOSCOPY N/A 10/03/2015   Procedure: CYSTOSCOPY;  Surgeon: Irine Seal, MD;  Location: [facility];  Service: Urology;  Laterality: N/A;  . CYSTOSCOPY WITH STENT PLACEMENT Right  10/31/2015   Procedure: CYSTOSCOPY WITH ATTEMPTED RIGHT URETERAL OPENING;  Surgeon: Irine Seal, MD;  Location: [facility];  Service: Urology;  Laterality: Right;  . INTERSTIM IMPLANT PLACEMENT N/A 03/14/2018   Procedure: Barrie Lyme IMPLANT FIRST STAGE;  Surgeon: Cleon Gustin, MD;  Location: [facility];  Service: Urology;  Laterality: N/A;  . INTERSTIM IMPLANT PLACEMENT N/A 03/30/2018   Procedure: Barrie Lyme IMPLANT SECOND STAGE;  Surgeon: Cleon Gustin, MD;  Location: [facility];  Service: Urology;  Laterality: N/A;  . MULTIPLE TOOTH EXTRACTIONS  2015  . OVARIAN CYST REMOVAL Right 2004 approx  . TONSILLECTOMY  12/30/2004  . TOTAL ABDOMINAL HYSTERECTOMY W/ BILATERAL SALPINGOOPHORECTOMY  05/16/2004  . TRANSURETHRAL RESECTION OF BLADDER TUMOR N/A 10/03/2015   Procedure: TRANSURETHRAL RESECTION OF BLADDER TUMOR (TURBT);  Surgeon: Irine Seal, MD;  Location: [facility];  Service: Urology;  Laterality: N/A;  . TRANSURETHRAL RESECTION OF  BLADDER TUMOR N/A 10/31/2015   Procedure: RE-STAGINGG TRANSURETHRAL RESECTION OF BLADDER TUMOR (TURBT);  Surgeon: Irine Seal, MD;  Location[facility];  Service: Urology;  Laterality: N/A;  . TRANSURETHRAL RESECTION OF BLADDER TUMOR N/A 08/17/2016   Procedure: TRANSURETHRAL RESECTION OF BLADDER TUMOR (TURBT);  Surgeon: Cleon Gustin, MD;  Location: [facility];  Service: Urology;  Laterality: N/A;    Social History   Socioeconomic History  . Marital status: Legally Separated    Spouse name: Not on file  . Number of children: Not on file  . Years of education: Not on file  . Highest education level: Not on file  Occupational History  . Not on file  Tobacco Use  . Smoking status: Current Every Day Smoker    Packs/day: 0.50    Types: Cigarettes  . Smokeless tobacco: Never Used  Substance and Sexual Activity  . Alcohol use: No  . Drug use: No  . Sexual activity: Not Currently    Birth control/protection:  Surgical    Comment: hyst  Other Topics Concern  . Not on file  Social History Narrative  . Not on file   Social Determinants of Health   Financial Resource Strain:   . Difficulty of Paying Living Expenses: Not on file  Food Insecurity:   . Worried About Charity fundraiser in the Last Year: Not on file  . Ran Out of Food in the Last Year: Not on file  Transportation Needs:   . Lack of Transportation (Medical): Not on file  . Lack of Transportation (Non-Medical): Not on file  Physical Activity:   . Days of Exercise per Week: Not on file  . Minutes of Exercise per Session: Not on file  Stress:   . Feeling of Stress : Not on file  Social Connections:   . Frequency of Communication with Friends and Family: Not on file  . Frequency of Social Gatherings with Friends and Family: Not on file  . Attends Religious Services: Not on file  . Active Member of Clubs or Organizations: Not on file  . Attends Archivist Meetings: Not on file  . Marital Status: Not on file    Family History  Problem Relation Age of Onset  . Hypertension Mother   . Cancer Mother   . Breast cancer Maternal Aunt   . Lung cancer Maternal Uncle   . Cancer Other   . Diabetes Daughter     Outpatient Encounter Medications as of 02/28/2019  Medication Sig  . aspirin-acetaminophen-caffeine (EXCEDRIN MIGRAINE) 250-250-65 MG tablet Take 2 tablets by mouth 3 (three) times daily as needed for migraine.   . Blood Glucose Monitoring Suppl (ACCU-CHEK GUIDE) w/Device KIT 1 Piece by Does not apply route as directed.  . Blood Glucose Monitoring Suppl (ACCU-CHEK GUIDE) w/Device KIT 1 Piece by Does not apply route as directed.  . cloNIDine (CATAPRES) 0.1 MG tablet Take 0.1 mg by mouth 2 (two) times daily.  . cyclobenzaprine (FLEXERIL) 5 MG tablet Take 1 tablet (5 mg total) by mouth 3 (three) times daily as needed.  Marland Kitchen FLUoxetine (PROZAC) 20 MG capsule Take 40 mg by mouth at bedtime.  . gabapentin (NEURONTIN) 300 MG  capsule Take 1 capsule (300 mg total) by mouth 3 (three) times daily.  Marland Kitchen glipiZIDE (GLUCOTROL XL) 5 MG 24 hr tablet Take 1 tablet (5 mg total) by mouth daily with breakfast.  . glucose blood (ACCU-CHEK GUIDE) test strip Use as instructed  . HYDROcodone-acetaminophen (NORCO/VICODIN) 5-325  MG tablet One tablet every six hours for pain.  Limit 7 days.  . Insulin Degludec (TRESIBA FLEXTOUCH) 200 UNIT/ML SOPN Inject 30 Units into the skin at bedtime.  . Insulin Pen Needle (B-D ULTRAFINE III SHORT PEN) 31G X 8 MM MISC 1 each by Does not apply route as directed.  . metFORMIN (GLUCOPHAGE) 1000 MG tablet Take 1 tablet (1,000 mg total) by mouth 2 (two) times daily with a meal.  . pantoprazole (PROTONIX) 40 MG tablet Take 40 mg by mouth at bedtime.   . rosuvastatin (CRESTOR) 20 MG tablet Take 1 tablet (20 mg total) by mouth daily.  . [DISCONTINUED] Insulin Degludec (TRESIBA FLEXTOUCH) 200 UNIT/ML SOPN Inject 20 Units into the skin at bedtime.   Facility-Administered Encounter Medications as of 02/28/2019  Medication  . betamethasone acetate-betamethasone sodium phosphate (CELESTONE) injection 3 mg    ALLERGIES: Allergies  Allergen Reactions  . Benazepril Hives  . Ibuprofen Hives    itching  . Metronidazole Nausea And Vomiting  . Oxycodone     Pt has tolerated hydromorphone in the past and takes Norco at home.  . Adhesive [Tape] Rash  . Codeine Nausea And Vomiting and Rash  . Loratadine Rash  . Nystatin-Triamcinolone Rash  . Penicillins Rash    Has patient had a PCN reaction causing immediate rash, facial/tongue/throat swelling, SOB or lightheadedness with hypotension: No Has patient had a PCN reaction causing severe rash involving mucus membranes or skin necrosis: Yes Has patient had a PCN reaction that required hospitalization: No Has patient had a PCN reaction occurring within the last 10 years: no  If all of the above answers are "NO", then may proceed with Cephalosporin use.     VACCINATION STATUS: Immunization History  Administered Date(s) Administered  . Influenza,inj,Quad PF,6+ Mos 11/01/2015    Diabetes She presents for her follow-up diabetic visit. She has type 2 diabetes mellitus. Onset time: She was diagnosed at approximate age of 84 years with A1c of greater than 15% Her disease course has been worsening (She was initiated on Lantus, metformin.  She admits that she has not been consistent taking her Lantus, however she has seen improvement in her glycemic profile.). Pertinent negatives for hypoglycemia include no confusion, headaches, pallor or seizures. Associated symptoms include foot paresthesias, polydipsia and polyuria. Pertinent negatives for diabetes include no blurred vision, no chest pain, no fatigue and no polyphagia. Symptoms are improving. Risk factors for coronary artery disease include dyslipidemia, diabetes mellitus, hypertension, sedentary lifestyle and tobacco exposure. Current diabetic treatments: Metformin 1000 mg p.o. twice daily. Her weight is fluctuating minimally. She is following a generally unhealthy diet. When asked about meal planning, she reported none. She has not had a previous visit with a dietitian. She rarely participates in exercise. Her home blood glucose trend is decreasing steadily. Her breakfast blood glucose range is generally 180-200 mg/dl. Her lunch blood glucose range is generally >200 mg/dl. Her dinner blood glucose range is generally >200 mg/dl. Her bedtime blood glucose range is generally >200 mg/dl. Her overall blood glucose range is >200 mg/dl. (She was put on a basal insulin last visit, responding slowly.  Her fasting blood glucose ranged between 124-264, prelunch between 188-249, presupper between 119-281, bedtime between 141-340 .   Her point-of-care A1c is 12.1%, increasing from 11.2%.  She did have A1c of greater than 15% last year.   ) An ACE inhibitor/angiotensin II receptor blocker is not being taken.   Hyperlipidemia This is a chronic problem. The problem is uncontrolled. Exacerbating  diseases include diabetes. Pertinent negatives include no chest pain, myalgias or shortness of breath. She is currently on no antihyperlipidemic treatment. Risk factors for coronary artery disease include diabetes mellitus, dyslipidemia, a sedentary lifestyle and family history.  Hypertension This is a chronic problem. The problem is uncontrolled. Pertinent negatives include no blurred vision, chest pain, headaches, palpitations or shortness of breath. Risk factors for coronary artery disease include dyslipidemia and diabetes mellitus. Past treatments include nothing.    Review of systems: Limited as above. Objective:    There were no vitals taken for this visit.  Wt Readings from Last 3 Encounters:  02/21/19 154 lb (69.9 kg)  02/15/19 155 lb (70.3 kg)  01/03/19 154 lb 8 oz (70.1 kg)       CMP ( most recent) CMP     Component Value Date/Time   NA 130 (L) 12/17/2018 0020   K 4.0 12/17/2018 0020   CL 93 (L) 12/17/2018 0020   CO2 23 12/17/2018 0020   GLUCOSE 630 (HH) 12/17/2018 0020   BUN 11 12/17/2018 0020   BUN 9 02/24/2018 0000   CREATININE 0.75 12/17/2018 0020   CALCIUM 9.7 12/17/2018 0020   PROT 6.8 02/24/2018 1923   ALBUMIN 3.4 (L) 02/24/2018 1923   AST 11 (L) 02/24/2018 1923   ALT 16 02/24/2018 1923   ALKPHOS 127 (H) 02/24/2018 1923   BILITOT 0.4 02/24/2018 1923   GFRNONAA >60 12/17/2018 0020   GFRAA >60 12/17/2018 0020     Diabetic Labs (most recent): Lab Results  Component Value Date   HGBA1C 12.1 (A) 02/15/2019   HGBA1C 11.2 (A) 10/10/2018   HGBA1C 15 02/24/2018     Assessment & Plan:   1. Uncontrolled type 2 diabetes mellitus with hyperglycemia ([place])  - Sandra [name] has currently uncontrolled symptomatic type 2 DM since  44 years of age.  She was put on a basal insulin  last visit, responding slowly.  Her fasting blood glucose ranged between 124-264, prelunch  between 188-249, presupper between 119-281, bedtime between 141-340 .   Her point-of-care A1c is 12.1%, increasing from 11.2%.  She did have A1c of greater than 15% last year.    - I had a long discussion with her about the progressive nature of diabetes and the pathology behind its complications. -her diabetes is complicated by chronic smoking and she remains at a high risk for more acute and chronic complications which include CAD, CVA, CKD, retinopathy, and neuropathy. These are all discussed in detail with her.  - I have counseled her on diet  and weight management  by adopting a carbohydrate restricted/protein rich diet. Patient is encouraged to switch to  unprocessed or minimally processed     complex starch and increased protein intake (animal or plant source), fruits, and vegetables. -  she is advised to stick to a routine mealtimes to eat 3 meals  a day and avoid unnecessary snacks ( to snack only to correct hypoglycemia).   - she  admits there is a room for improvement in her diet and drink choices. -  Suggestion is made for her to avoid simple carbohydrates  from her diet including Cakes, Sweet Desserts / Pastries, Ice Cream, Soda (diet and regular), Sweet Tea, Candies, Chips, Cookies, Sweet Pastries,  Store Bought Juices, Alcohol in Excess of  1-2 drinks a day, Artificial Sweeteners, Coffee Creamer, and "Sugar-free" Products. This will help patient to have stable blood glucose profile and potentially avoid unintended weight gain.   - she will  be scheduled with Sandra [name], RDN, CDE for diabetes education.  - I have approached her with the following individualized plan to manage  her diabetes and patient agrees:   -Her presentation with still significantly above target glycemic profile and upcoming shoulder surgery, she will continue to need insulin treatment in order for her to achieve control of diabetes to target. -She is advised to increase her Tresiba to 30 units nightly,  associated with strict monitoring of blood glucose 4 times a day-before meals and at bedtime and she would be on phone visit in 10 days, to clear her for her elective shoulder surgery.    -She is encouraged to call clinic for readings below 70 or greater than 200 mg per DL x3.  - she is advised to continue metformin 1000 mg p.o. twice daily, therapeutically suitable for patient . -She has benefited from low-dose glipizide therapy.  She is advised to continue  glipizide 5 mg XL p.o. at breakfast.  - she is not a suitable candidate for incretin therapy, she will need fasting lipid profile on subsequent visits.  - Specific targets for  A1c;  LDL, HDL, Triglycerides, and  Waist Circumference were discussed with the patient.  2) Blood Pressure /Hypertension: she is advised to home monitor blood pressure and report if > 140/90 on 2 separate readings.  She is allergic to ACE inhibitors. she is a chronic heavy smoker, counseled  on smoking cessation. She will be considered for medication intervention next visit.   3) Lipids/Hyperlipidemia: No recent lipid panel to review.  Given her periorbital xanthelasma, she is advised to continue Crestor 20 mg p.o. nightly.     Side effects and precautions discussed with her.  4)  Weight/Diet: She has a BMI of 30.5-      she is  a candidate for modest weight loss. I discussed with her the fact that loss of 5 - 10% of her  current body weight will have the most impact on her diabetes management.  Exercise, and detailed carbohydrates information provided  -  detailed on discharge instructions.  5) Chronic Care/Health Maintenance:  -she  Is not on ACEI/ARB and Statin medications and  is encouraged to initiate and continue to follow up with Ophthalmology, Dentist,  Podiatrist at least yearly or according to recommendations, and advised to  Quit smoking. I have recommended yearly flu vaccine and pneumonia vaccine at least every 5 years; moderate intensity exercise for  up to 150 minutes weekly; and  sleep for at least 7 hours a day.  - she is  advised to maintain close follow up with Joyice Faster, FNP for primary care needs, as well as her other providers for optimal and coordinated care.  - Time spent on this patient care encounter:  35 min, of which >50% was spent in  counseling and the rest reviewing her  current and  previous labs/studies ( including abstraction from other facilities),  previous treatments, her blood glucose readings, and medications' doses and developing a plan for long-term care based on the latest recommendations for standards of care; and documenting her care.  Sandra [name] participated in the discussions, expressed understanding, and voiced agreement with the above plans.  All questions were answered to her satisfaction. she is encouraged to contact clinic should she have any questions or concerns prior to her return visit.   Follow up plan: - Return in about 10 days (around 03/10/2019), or phone, for Follow up with Meter and Logs Only -  no Labs, Include 8 log sheets.  Glade Lloyd, MD [facility] [address] [place], [place] [address] Phone: [phone]  Fax: [phone]    02/28/2019, 2:21 PM  This note was partially dictated with voice recognition software. Similar sounding words can be transcribed inadequately or may not  be corrected upon review.

## 2019-03-01 ENCOUNTER — Other Ambulatory Visit: Payer: Self-pay

## 2019-03-01 MED ORDER — INSULIN GLARGINE 100 UNIT/ML SOLOSTAR PEN: 30.0000 [IU] | PEN_INJECTOR | Freq: Every day | SUBCUTANEOUS | 0 refills | Status: DC

## 2019-03-06 ENCOUNTER — Other Ambulatory Visit: Payer: Self-pay | Admitting: Orthopedic Surgery

## 2019-03-06 NOTE — Telephone Encounter (Signed)
Patient requests refill on Hydrocodone/Acetaminophen 5-325 mgs.  Qty  15  Sig: One tablet every six hours for pain. Limit 7 days.  Patient uses [facility] in [place]

## 2019-03-08 ENCOUNTER — Telehealth: Payer: Self-pay | Admitting: Orthopedic Surgery

## 2019-03-08 NOTE — Telephone Encounter (Signed)
Patient is calling inquiring about refill of medication. See other note in chart.  Are we able to refill this for her?

## 2019-03-08 NOTE — Telephone Encounter (Signed)
Patient states her pain medication refill as requested on 03/06/19 is "still not at pharmacy" - [facility], [place], [place]. Encounter appears to be open. Please advise.

## 2019-03-09 ENCOUNTER — Other Ambulatory Visit: Payer: Self-pay | Admitting: Orthopedic Surgery

## 2019-03-09 MED ORDER — HYDROCODONE-ACETAMINOPHEN 5-325 MG PO TABS: ORAL_TABLET | ORAL | 0 refills | Status: DC

## 2019-03-09 NOTE — Telephone Encounter (Signed)
I am going to fill it but the patient has not been scheduled for surgery yet and I have never filled her medication for pain

## 2019-03-10 ENCOUNTER — Telehealth: Payer: Self-pay

## 2019-03-10 ENCOUNTER — Ambulatory Visit (INDEPENDENT_AMBULATORY_CARE_PROVIDER_SITE_OTHER): Payer: Medicaid Other | Admitting: "Endocrinology

## 2019-03-10 ENCOUNTER — Encounter: Payer: Self-pay | Admitting: "Endocrinology

## 2019-03-10 DIAGNOSIS — E782 Mixed hyperlipidemia: Secondary | ICD-10-CM | POA: Diagnosis not present

## 2019-03-10 DIAGNOSIS — E1165 Type 2 diabetes mellitus with hyperglycemia: Secondary | ICD-10-CM | POA: Diagnosis not present

## 2019-03-10 MED ORDER — GLIPIZIDE ER 5 MG PO TB24: 10.0000 mg | ORAL_TABLET | Freq: Every day | ORAL | 3 refills | Status: DC

## 2019-03-10 NOTE — Telephone Encounter (Signed)
Sandra [name]- sending to you in case patient calls today.  Amy- FYI for future.

## 2019-03-10 NOTE — Telephone Encounter (Signed)
Spoke with patient and she had picked up her medicine yesterday. Also,she stated her diabetic doctor has cleared her for surgery, I told her that her doctor would need to send Dr. Aline [name] something stating she has been cleared for surgery.

## 2019-03-10 NOTE — Progress Notes (Signed)
03/10/2019, 10:14 AM                                                      Endocrinology Telehealth Visit Follow up Note -During COVID -19 Pandemic  This visit type was conducted due to national recommendations for restrictions regarding the COVID-19 Pandemic  in an effort to limit this patient's exposure and mitigate transmission of the corona virus.  Due to her co-morbid illnesses, Sandra [name] is at  moderate to high risk for complications without adequate follow up.  This format is felt to be most appropriate for her at this time.  I connected with this patient on 03/10/2019   by telephone and verified that I am speaking with the correct person using two identifiers. Sandra [name], 12/08/1975. she has verbally consented to this visit. All issues noted in this document were discussed and addressed. The format was not optimal for physical exam.    Subjective:    Patient ID: Sandra [name], female    DOB: 1975/04/01.  Sandra [name] is being engaged in telehealth via telephone in follow-up after she was seen in consultation for management of currently uncontrolled symptomatic type 2  diabetes requested by  Joyice Faster, FNP.   Past Medical History:  Diagnosis Date  . ADHD (attention deficit hyperactivity disorder)   . Allergic rhinitis   . Anemia    hx of  . Bipolar 1 disorder ([place])   . Bladder tumor   . Cancer ([place])    bladder  . Dizziness   . Full dentures   . GERD (gastroesophageal reflux disease)   . Gross hematuria   . Hyperlipidemia   . Hypertension   . Type 2 diabetes mellitus ([place])   . Urgency of urination    dysuria, sui  . Wears glasses     Past Surgical History:  Procedure Laterality Date  . CYSTOSCOPY N/A 10/03/2015   Procedure: CYSTOSCOPY;  Surgeon: Irine Seal, MD;  Location: [facility];  Service: Urology;  Laterality: N/A;  . CYSTOSCOPY WITH STENT PLACEMENT Right  10/31/2015   Procedure: CYSTOSCOPY WITH ATTEMPTED RIGHT URETERAL OPENING;  Surgeon: Irine Seal, MD;  Location: [facility];  Service: Urology;  Laterality: Right;  . INTERSTIM IMPLANT PLACEMENT N/A 03/14/2018   Procedure: [place] IMPLANT FIRST STAGE;  Surgeon: Cleon Gustin, MD;  Location: [facility];  Service: Urology;  Laterality: N/A;  . INTERSTIM IMPLANT PLACEMENT N/A 03/30/2018   Procedure: [place] IMPLANT SECOND STAGE;  Surgeon: Cleon Gustin, MD;  Location: [facility];  Service: Urology;  Laterality: N/A;  . MULTIPLE TOOTH EXTRACTIONS  2015  . OVARIAN CYST REMOVAL Right 2004 approx  . TONSILLECTOMY  12/30/2004  . TOTAL ABDOMINAL HYSTERECTOMY W/ BILATERAL SALPINGOOPHORECTOMY  05/16/2004  . TRANSURETHRAL RESECTION OF BLADDER TUMOR N/A 10/03/2015   Procedure: TRANSURETHRAL RESECTION OF BLADDER TUMOR (TURBT);  Surgeon: Irine Seal, MD;  Location: [facility];  Service: Urology;  Laterality: N/A;  . TRANSURETHRAL RESECTION OF  BLADDER TUMOR N/A 10/31/2015   Procedure: RE-STAGINGG TRANSURETHRAL RESECTION OF BLADDER TUMOR (TURBT);  Surgeon: Irine Seal, MD;  Location: [facility];  Service: Urology;  Laterality: N/A;  . TRANSURETHRAL RESECTION OF BLADDER TUMOR N/A 08/17/2016   Procedure: TRANSURETHRAL RESECTION OF BLADDER TUMOR (TURBT);  Surgeon: Cleon Gustin, MD;  Location: [facility];  Service: Urology;  Laterality: N/A;    Social History   Socioeconomic History  . Marital status: Legally Separated    Spouse name: Not on file  . Number of children: Not on file  . Years of education: Not on file  . Highest education level: Not on file  Occupational History  . Not on file  Tobacco Use  . Smoking status: Current Every Day Smoker    Packs/day: 0.50    Types: Cigarettes  . Smokeless tobacco: Never Used  Substance and Sexual Activity  . Alcohol use: No  . Drug use: No  . Sexual activity: Not Currently    Birth control/protection:  Surgical    Comment: hyst  Other Topics Concern  . Not on file  Social History Narrative  . Not on file   Social Determinants of Health   Financial Resource Strain:   . Difficulty of Paying Living Expenses: Not on file  Food Insecurity:   . Worried About Charity fundraiser in the Last Year: Not on file  . Ran Out of Food in the Last Year: Not on file  Transportation Needs:   . Lack of Transportation (Medical): Not on file  . Lack of Transportation (Non-Medical): Not on file  Physical Activity:   . Days of Exercise per Week: Not on file  . Minutes of Exercise per Session: Not on file  Stress:   . Feeling of Stress : Not on file  Social Connections:   . Frequency of Communication with Friends and Family: Not on file  . Frequency of Social Gatherings with Friends and Family: Not on file  . Attends Religious Services: Not on file  . Active Member of Clubs or Organizations: Not on file  . Attends Archivist Meetings: Not on file  . Marital Status: Not on file    Family History  Problem Relation Age of Onset  . Hypertension Mother   . Cancer Mother   . Breast cancer Maternal Aunt   . Lung cancer Maternal Uncle   . Cancer Other   . Diabetes Daughter     Outpatient Encounter Medications as of 03/10/2019  Medication Sig  . aspirin-acetaminophen-caffeine (EXCEDRIN MIGRAINE) 250-250-65 MG tablet Take 2 tablets by mouth 3 (three) times daily as needed for migraine.   . Blood Glucose Monitoring Suppl (ACCU-CHEK GUIDE) w/Device KIT 1 Piece by Does not apply route as directed.  . Blood Glucose Monitoring Suppl (ACCU-CHEK GUIDE) w/Device KIT 1 Piece by Does not apply route as directed.  . cloNIDine (CATAPRES) 0.1 MG tablet Take 0.1 mg by mouth 2 (two) times daily.  . cyclobenzaprine (FLEXERIL) 5 MG tablet Take 1 tablet (5 mg total) by mouth 3 (three) times daily as needed.  Marland Kitchen FLUoxetine (PROZAC) 20 MG capsule Take 40 mg by mouth at bedtime.  . gabapentin (NEURONTIN) 300 MG  capsule Take 1 capsule (300 mg total) by mouth 3 (three) times daily.  Marland Kitchen glipiZIDE (GLUCOTROL XL) 5 MG 24 hr tablet Take 2 tablets (10 mg total) by mouth daily with breakfast.  . glucose blood (ACCU-CHEK GUIDE) test strip Use as instructed  . HYDROcodone-acetaminophen (NORCO/VICODIN) 5-325  MG tablet One tablet every six hours for pain.  Limit 7 days.  . Insulin Degludec (TRESIBA FLEXTOUCH) 200 UNIT/ML SOPN Inject 30 Units into the skin at bedtime.  . Insulin Glargine (LANTUS) 100 UNIT/ML Solostar Pen Inject 30 Units into the skin at bedtime.  . Insulin Pen Needle (B-D ULTRAFINE III SHORT PEN) 31G X 8 MM MISC 1 each by Does not apply route as directed.  . metFORMIN (GLUCOPHAGE) 1000 MG tablet Take 1 tablet (1,000 mg total) by mouth 2 (two) times daily with a meal.  . pantoprazole (PROTONIX) 40 MG tablet Take 40 mg by mouth at bedtime.   . rosuvastatin (CRESTOR) 20 MG tablet Take 1 tablet (20 mg total) by mouth daily.  . [DISCONTINUED] glipiZIDE (GLUCOTROL XL) 5 MG 24 hr tablet Take 1 tablet (5 mg total) by mouth daily with breakfast.   Facility-Administered Encounter Medications as of 03/10/2019  Medication  . betamethasone acetate-betamethasone sodium phosphate (CELESTONE) injection 3 mg    ALLERGIES: Allergies  Allergen Reactions  . Benazepril Hives  . Ibuprofen Hives    itching  . Metronidazole Nausea And Vomiting  . Oxycodone     Pt has tolerated hydromorphone in the past and takes Norco at home.  . Adhesive [Tape] Rash  . Codeine Nausea And Vomiting and Rash  . Loratadine Rash  . Nystatin-Triamcinolone Rash  . Penicillins Rash    Has patient had a PCN reaction causing immediate rash, facial/tongue/throat swelling, SOB or lightheadedness with hypotension: No Has patient had a PCN reaction causing severe rash involving mucus membranes or skin necrosis: Yes Has patient had a PCN reaction that required hospitalization: No Has patient had a PCN reaction occurring within the last 10  years: no  If all of the above answers are "NO", then may proceed with Cephalosporin use.    VACCINATION STATUS: Immunization History  Administered Date(s) Administered  . Influenza,inj,Quad PF,6+ Mos 11/01/2015    Diabetes She presents for her follow-up diabetic visit. She has type 2 diabetes mellitus. Onset time: She was diagnosed at approximate age of 49 years with A1c of greater than 15% Her disease course has been improving (She was initiated on Lantus, metformin.  She admits that she has not been consistent taking her Lantus, however she has seen improvement in her glycemic profile.). Pertinent negatives for hypoglycemia include no confusion, headaches, pallor or seizures. Associated symptoms include foot paresthesias. Pertinent negatives for diabetes include no blurred vision, no chest pain, no fatigue, no polydipsia, no polyphagia and no polyuria. Symptoms are improving. Risk factors for coronary artery disease include dyslipidemia, diabetes mellitus, hypertension, sedentary lifestyle and tobacco exposure. Current diabetic treatments: Metformin 1000 mg p.o. twice daily. Her weight is fluctuating minimally. She is following a generally unhealthy diet. When asked about meal planning, she reported none. She has not had a previous visit with a dietitian. She rarely participates in exercise. Her home blood glucose trend is decreasing steadily. Her breakfast blood glucose range is generally 140-180 mg/dl. Her lunch blood glucose range is generally 140-180 mg/dl. Her dinner blood glucose range is generally 140-180 mg/dl. Her bedtime blood glucose range is generally 140-180 mg/dl. Her overall blood glucose range is 140-180 mg/dl. (She has responded favorably with glycemic profile averaging between 100-150 mg per DL over the last 7 days.      Her recent point-of-care A1c is 12.1%, increasing from 11.2%.  She did have A1c of greater than 15% last year.   ) An ACE inhibitor/angiotensin II receptor  blocker is  not being taken.  Hyperlipidemia This is a chronic problem. The problem is uncontrolled. Exacerbating diseases include diabetes. Pertinent negatives include no chest pain, myalgias or shortness of breath. She is currently on no antihyperlipidemic treatment. Risk factors for coronary artery disease include diabetes mellitus, dyslipidemia, a sedentary lifestyle and family history.  Hypertension This is a chronic problem. The problem is uncontrolled. Pertinent negatives include no blurred vision, chest pain, headaches, palpitations or shortness of breath. Risk factors for coronary artery disease include dyslipidemia and diabetes mellitus. Past treatments include nothing.    Review of systems: Limited as above. Objective:    There were no vitals taken for this visit.  Wt Readings from Last 3 Encounters:  02/28/19 150 lb (68 kg)  02/21/19 154 lb (69.9 kg)  02/15/19 155 lb (70.3 kg)       CMP ( most recent) CMP     Component Value Date/Time   NA 130 (L) 12/17/2018 0020   K 4.0 12/17/2018 0020   CL 93 (L) 12/17/2018 0020   CO2 23 12/17/2018 0020   GLUCOSE 630 (HH) 12/17/2018 0020   BUN 11 12/17/2018 0020   BUN 9 02/24/2018 0000   CREATININE 0.75 12/17/2018 0020   CALCIUM 9.7 12/17/2018 0020   PROT 6.8 02/24/2018 1923   ALBUMIN 3.4 (L) 02/24/2018 1923   AST 11 (L) 02/24/2018 1923   ALT 16 02/24/2018 1923   ALKPHOS 127 (H) 02/24/2018 1923   BILITOT 0.4 02/24/2018 1923   GFRNONAA >60 12/17/2018 0020   GFRAA >60 12/17/2018 0020     Diabetic Labs (most recent): Lab Results  Component Value Date   HGBA1C 12.1 (A) 02/15/2019   HGBA1C 11.2 (A) 10/10/2018   HGBA1C 15 02/24/2018     Assessment & Plan:   1. Uncontrolled type 2 diabetes mellitus with hyperglycemia ([place])  - Toi L Lanes has currently uncontrolled symptomatic type 2 DM since  44 years of age.  She was put on a basal insulin  last visit. She has responded favorably with glycemic profile averaging  between 100-150 mg per DL over the last 7 days.      Her recent point-of-care A1c is 12.1%, increasing from 11.2%.  She did have A1c of greater than 15% last year.     - I had a long discussion with her about the progressive nature of diabetes and the pathology behind its complications. -her diabetes is complicated by chronic smoking and she remains at a high risk for more acute and chronic complications which include CAD, CVA, CKD, retinopathy, and neuropathy. These are all discussed in detail with her.  - I have counseled her on diet  and weight management  by adopting a carbohydrate restricted/protein rich diet. Patient is encouraged to switch to  unprocessed or minimally processed     complex starch and increased protein intake (animal or plant source), fruits, and vegetables. -  she is advised to stick to a routine mealtimes to eat 3 meals  a day and avoid unnecessary snacks ( to snack only to correct hypoglycemia).   - she  admits there is a room for improvement in her diet and drink choices. -  Suggestion is made for her to avoid simple carbohydrates  from her diet including Cakes, Sweet Desserts / Pastries, Ice Cream, Soda (diet and regular), Sweet Tea, Candies, Chips, Cookies, Sweet Pastries,  Store Bought Juices, Alcohol in Excess of  1-2 drinks a day, Artificial Sweeteners, Coffee Creamer, and "Sugar-free" Products. This will help patient to  have stable blood glucose profile and potentially avoid unintended weight gain.   - she will be scheduled with Jearld Fenton, RDN, CDE for diabetes education.  - I have approached her with the following individualized plan to manage  her diabetes and patient agrees:   -She has been responded to her treatment regimen.  She is advised to continue Tresiba 30 units nightly, associated with monitoring of blood glucose 4 times a day-until the time of her surgery.   -From the point of view of diabetes, she is clear for her scheduled elective shoulder  surgery.    -She is encouraged to call clinic for readings below 70 or greater than 200 mg per DL x3.  - she is advised to continue metformin 1000 mg p.o. twice daily, therapeutically suitable for patient . -She has benefited from glipizide therapy.  To give her even better control, she is advised to increase her glipizide to 10 mg XL p.o. daily at bedtime.  This medication will be lowered back to 5 mg after her next visit.    - she is not a suitable candidate for incretin therapy, she will need fasting lipid profile on subsequent visits.  - Specific targets for  A1c;  LDL, HDL, Triglycerides, and  Waist Circumference were discussed with the patient.  2) Blood Pressure /Hypertension: she is advised to home monitor blood pressure and report if > 140/90 on 2 separate readings.  She is allergic to ACE inhibitors. she is a chronic heavy smoker, counseled  on smoking cessation. She will be considered for medication intervention next visit.   3) Lipids/Hyperlipidemia: No recent lipid panel to review.  Given her periorbital xanthelasma, she is advised to continue Crestor 20 mg p.o. nightly.   Side effects and precautions discussed with her.  4)  Weight/Diet: She has a BMI of 30.5-      she is  a candidate for modest weight loss. I discussed with her the fact that loss of 5 - 10% of her  current body weight will have the most impact on her diabetes management.  Exercise, and detailed carbohydrates information provided  -  detailed on discharge instructions.  5) Chronic Care/Health Maintenance:  -she  is not on ACEI/ARB and Statin medications and  is encouraged to initiate and continue to follow up with Ophthalmology, Dentist,  Podiatrist at least yearly or according to recommendations, and advised to  Quit smoking. I have recommended yearly flu vaccine and pneumonia vaccine at least every 5 years; moderate intensity exercise for up to 150 minutes weekly; and  sleep for at least 7 hours a day.  - she  is  advised to maintain close follow up with Joyice Faster, FNP for primary care needs, as well as her other providers for optimal and coordinated care.  - Time spent on this patient care encounter:  35 min, of which >50% was spent in  counseling and the rest reviewing her  current and  previous labs/studies ( including abstraction from other facilities),  previous treatments, her blood glucose readings, and medications' doses and developing a plan for long-term care based on the latest recommendations for standards of care; and documenting her care.  Sandra [name] participated in the discussions, expressed understanding, and voiced agreement with the above plans.  All questions were answered to her satisfaction. she is encouraged to contact clinic should she have any questions or concerns prior to her return visit.   Follow up plan: - Return in about 9 weeks (  around 05/12/2019) for Bring Meter and Logs- A1c in Office, Follow up with Pre-visit Labs.  Glade Lloyd, MD [facility] [address] [place], [place] [address] Phone: [phone]  Fax: [phone]    03/10/2019, 10:14 AM  This note was partially dictated with voice recognition software. Similar sounding words can be transcribed inadequately or may not  be corrected upon review.

## 2019-03-10 NOTE — Patient Instructions (Signed)

## 2019-03-10 NOTE — Telephone Encounter (Signed)
Sharing as Sandra [name].

## 2019-03-15 ENCOUNTER — Telehealth: Payer: Self-pay | Admitting: Orthopedic Surgery

## 2019-03-15 ENCOUNTER — Telehealth: Payer: Self-pay

## 2019-03-15 NOTE — Telephone Encounter (Signed)
No pain meds until after surgery

## 2019-03-15 NOTE — Telephone Encounter (Signed)
Pt called stating she takes lantus 30 units each night, states her BG this morning was 69, yesterday morning 80 and Monday morning 99.

## 2019-03-15 NOTE — Telephone Encounter (Signed)
Advise her to lower lantus to 24 units qhs.

## 2019-03-15 NOTE — Telephone Encounter (Signed)
Discussed with pt, understanding voiced. 

## 2019-03-15 NOTE — Telephone Encounter (Signed)
Patient requests refill on Hydrocodone/Acetaminophen 5-325  Mgs.  Qty  15  Sig: One tablet every six hours for pain. Limit 7 days.  Patient states she uses [facility] on [address]

## 2019-03-16 NOTE — Telephone Encounter (Signed)
Patient voiced understanding, however, she is asking if she may have any other medication?

## 2019-03-20 NOTE — Telephone Encounter (Signed)
Left message for her to call back so I can advise.

## 2019-03-20 NOTE — Telephone Encounter (Signed)
Ibuprofen 800 mg every 8 hrs and tylenol 500 mg every 6 hours

## 2019-03-21 NOTE — Telephone Encounter (Signed)
Tried again, unable to reach

## 2019-03-29 ENCOUNTER — Telehealth: Payer: Self-pay | Admitting: Radiology

## 2019-03-29 ENCOUNTER — Ambulatory Visit: Payer: Medicaid Other | Admitting: Orthopedic Surgery

## 2019-03-29 ENCOUNTER — Other Ambulatory Visit: Payer: Self-pay

## 2019-03-29 VITALS — BP 145/100 | HR 84 | Temp 97.0°F | Ht 61.0 in | Wt 158.0 lb

## 2019-03-29 DIAGNOSIS — M75122 Complete rotator cuff tear or rupture of left shoulder, not specified as traumatic: Secondary | ICD-10-CM | POA: Diagnosis not present

## 2019-03-29 NOTE — Telephone Encounter (Signed)
Dr Aline Brochure spoke to Dr Alyson Ingles, patient has a device that turns off the bladder stim, she will bring it with her for the surgery

## 2019-03-29 NOTE — Progress Notes (Signed)
Chief Complaint  Patient presents with  . Shoulder Pain    Recheck on left shoulder.    44 year old female diabetic recently evaluated by Dr. Dorris Fetch cleared for surgery.  Patient is to have open left rotator cuff repair  We have gone over her allergies, her recent glucose numbers.  She is in severe pain at the moment.  I reviewed her CT arthrogram again.  She will need an open cuff repair left shoulder  She will have pain medication stopped 1 week prior to surgery to assist with postop pain management  The procedure has been fully reviewed with the patient; The risks and benefits of surgery have been discussed and explained and understood. Alternative treatment has also been reviewed, questions were encouraged and answered. The postoperative plan is also been reviewed.  Encounter Diagnosis  Name Primary?  . Complete tear of left rotator cuff, unspecified whether traumatic Yes    Note the patient has some type of bladder device which we will need to get information from Dr. Noah Delaine regarding use of a Bovie versus bipolar or whether or not this is even an issue  Past Medical History:  Diagnosis Date  . ADHD (attention deficit hyperactivity disorder)   . Allergic rhinitis   . Anemia    hx of  . Bipolar 1 disorder ([place])   . Bladder tumor   . Cancer ([place])    bladder  . Dizziness   . Full dentures   . GERD (gastroesophageal reflux disease)   . Gross hematuria   . Hyperlipidemia   . Hypertension   . Type 2 diabetes mellitus ([place])   . Urgency of urination    dysuria, sui  . Wears glasses    Past Surgical History:  Procedure Laterality Date  . CYSTOSCOPY N/A 10/03/2015   Procedure: CYSTOSCOPY;  Surgeon: Irine Seal, MD;  Location: [facility];  Service: Urology;  Laterality: N/A;  . CYSTOSCOPY WITH STENT PLACEMENT Right 10/31/2015   Procedure: CYSTOSCOPY WITH ATTEMPTED RIGHT URETERAL OPENING;  Surgeon: Irine Seal, MD;  Location: [facility];   Service: Urology;  Laterality: Right;  . INTERSTIM IMPLANT PLACEMENT N/A 03/14/2018   Procedure: Barrie Lyme IMPLANT FIRST STAGE;  Surgeon: Cleon Gustin, MD;  Location: [facility];  Service: Urology;  Laterality: N/A;  . INTERSTIM IMPLANT PLACEMENT N/A 03/30/2018   Procedure: Barrie Lyme IMPLANT SECOND STAGE;  Surgeon: Cleon Gustin, MD;  Location: [facility];  Service: Urology;  Laterality: N/A;  . MULTIPLE TOOTH EXTRACTIONS  2015  . OVARIAN CYST REMOVAL Right 2004 approx  . TONSILLECTOMY  12/30/2004  . TOTAL ABDOMINAL HYSTERECTOMY W/ BILATERAL SALPINGOOPHORECTOMY  05/16/2004  . TRANSURETHRAL RESECTION OF BLADDER TUMOR N/A 10/03/2015   Procedure: TRANSURETHRAL RESECTION OF BLADDER TUMOR (TURBT);  Surgeon: Irine Seal, MD;  Location: [facility];  Service: Urology;  Laterality: N/A;  . TRANSURETHRAL RESECTION OF BLADDER TUMOR N/A 10/31/2015   Procedure: RE-STAGINGG TRANSURETHRAL RESECTION OF BLADDER TUMOR (TURBT);  Surgeon: Irine Seal, MD;  Location: [facility];  Service: Urology;  Laterality: N/A;  . TRANSURETHRAL RESECTION OF BLADDER TUMOR N/A 08/17/2016   Procedure: TRANSURETHRAL RESECTION OF BLADDER TUMOR (TURBT);  Surgeon: Cleon Gustin, MD;  Location: [facility];  Service: Urology;  Laterality: N/A;   Family History  Problem Relation Age of Onset  . Hypertension Mother   . Cancer Mother   . Breast cancer Maternal Aunt   . Lung cancer Maternal Uncle   . Cancer Other   . Diabetes  Daughter    Allergies  Allergen Reactions  . Benazepril Hives  . Ibuprofen Hives    itching  . Metronidazole Nausea And Vomiting  . Oxycodone     Pt has tolerated hydromorphone in the past and takes Norco at home.  . Adhesive [Tape] Rash  . Codeine Nausea And Vomiting and Rash  . Loratadine Rash  . Nystatin-Triamcinolone Rash  . Penicillins Rash    Has patient had a PCN reaction causing immediate rash, facial/tongue/throat swelling, SOB or lightheadedness with hypotension:  No Has patient had a PCN reaction causing severe rash involving mucus membranes or skin necrosis: Yes Has patient had a PCN reaction that required hospitalization: No Has patient had a PCN reaction occurring within the last 10 years: no  If all of the above answers are "NO", then may proceed with Cephalosporin use.    Current Outpatient Medications:  .  aspirin-acetaminophen-caffeine (EXCEDRIN MIGRAINE) 250-250-65 MG tablet, Take 2 tablets by mouth 3 (three) times daily as needed for migraine. , Disp: , Rfl:  .  Blood Glucose Monitoring Suppl (ACCU-CHEK GUIDE) w/Device KIT, 1 Piece by Does not apply route as directed., Disp: 1 kit, Rfl: 0 .  Blood Glucose Monitoring Suppl (ACCU-CHEK GUIDE) w/Device KIT, 1 Piece by Does not apply route as directed., Disp: 1 kit, Rfl: 0 .  cloNIDine (CATAPRES) 0.1 MG tablet, Take 0.1 mg by mouth 2 (two) times daily., Disp: , Rfl:  .  cyclobenzaprine (FLEXERIL) 5 MG tablet, Take 1 tablet (5 mg total) by mouth 3 (three) times daily as needed., Disp: 30 tablet, Rfl: 0 .  FLUoxetine (PROZAC) 20 MG capsule, Take 40 mg by mouth at bedtime., Disp: , Rfl:  .  gabapentin (NEURONTIN) 300 MG capsule, Take 1 capsule (300 mg total) by mouth 3 (three) times daily., Disp: 90 capsule, Rfl: 3 .  glipiZIDE (GLUCOTROL XL) 5 MG 24 hr tablet, Take 2 tablets (10 mg total) by mouth daily with breakfast., Disp: 60 tablet, Rfl: 3 .  glucose blood (ACCU-CHEK GUIDE) test strip, Use as instructed, Disp: 150 each, Rfl: 2 .  HYDROcodone-acetaminophen (NORCO/VICODIN) 5-325 MG tablet, One tablet every six hours for pain.  Limit 7 days., Disp: 15 tablet, Rfl: 0 .  Insulin Degludec (TRESIBA FLEXTOUCH) 200 UNIT/ML SOPN, Inject 30 Units into the skin at bedtime., Disp: 2 pen, Rfl: 2 .  Insulin Glargine (LANTUS) 100 UNIT/ML Solostar Pen, Inject 30 Units into the skin at bedtime., Disp: 15 mL, Rfl: 0 .  Insulin Pen Needle (B-D ULTRAFINE III SHORT PEN) 31G X 8 MM MISC, 1 each by Does not apply route as  directed., Disp: 100 each, Rfl: 3 .  metFORMIN (GLUCOPHAGE) 1000 MG tablet, Take 1 tablet (1,000 mg total) by mouth 2 (two) times daily with a meal., Disp: 60 tablet, Rfl: 3 .  pantoprazole (PROTONIX) 40 MG tablet, Take 40 mg by mouth at bedtime. , Disp: , Rfl: 2 .  rosuvastatin (CRESTOR) 20 MG tablet, Take 1 tablet (20 mg total) by mouth daily., Disp: 30 tablet, Rfl: 3  Current Facility-Administered Medications:  .  betamethasone acetate-betamethasone sodium phosphate (CELESTONE) injection 3 mg, 3 mg, Intramuscular, Once, Amalia Hailey, Dorathy Daft, DPM  Social History   Tobacco Use  . Smoking status: Current Every Day Smoker    Packs/day: 0.50    Types: Cigarettes  . Smokeless tobacco: Never Used  Substance Use Topics  . Alcohol use: No  . Drug use: No

## 2019-03-29 NOTE — Telephone Encounter (Signed)
Dr [name]  [facility] Phone: [phone] Patient has an interstem bladder implant

## 2019-03-29 NOTE — Patient Instructions (Signed)
Surgery for Rotator Cuff Tear  Rotator cuff surgery is only recommended for individuals who have experienced persistent disability for greater than 3 months of non-surgical (conservative) treatment. Surgery is not necessary but is recommended for individuals who experience difficulty completing daily activities or athletes who are unable to compete. Rotator cuff tears do not usually heal without surgical intervention. If left alone small rotator cuff tears usually become larger. Younger athletes who have a rotator cuff tear may be recommended for surgery without attempting conservative rehabilitation. The purpose of surgery is to regain function of the shoulder joint and eliminate pain associated with the injury. In addition to repairing the tendon tear, the surgery may often remove a portion of the bony roof of the shoulder (acromion) as well as the chronically thickened and inflamed membrane below the acromion (subacromial bursa). REASONS NOT TO OPERATE  Infection of the shoulder. Inability to complete a rehabilitation program. Patients who have other conditions (emotional or psychological) conditions that contribute to their shoulder condition. RISKS AND COMPLICATIONS Infection. Re-tear of the rotator cuff tendons or muscles. Shoulder stiffness and/or weakness. Inability to compete in athletics. Acromioclavicular (AC) joint pain Risks of surgery: infection, bleeding, nerve damage, or damage to surrounding tissues. TECHNIQUE There are different surgical procedures used to treat rotator cuff tears. The type of procedure depends on the extent of injury as well as the surgeon's preference. All of the surgical techniques for rotator cuff tears have the same goal of repairing the torn tendon, removing part of the acromion, and removing the subacromial bursa. There are two main types of procedures: arthroscopic and open incision. Arthroscopic procedures are usually completed and you go home the same day  as surgery (outpatient). These procedures use multiple small incisions in which tools and a video camera are placed to work on the shoulder. An electric shaver removes the bursa, then a power burr shaves down the portion of the acromion that places pressure on the rotator cuff. Finally the rotator cuff is sewed (sutured) back to the humeral head. Open incision procedures require a larger incision. The deltoid muscle is detached from the acromion and a ligament in the shoulder (coracoacromial) is cut in order for the surgeon to access the rotator cuff. The subacromial bursa is removed as well as part of the acromion to give the rotator cuff room to move freely. The torn tendon is then sutured to the humeral head. After the rotator cuff is repaired, then the deltoid is reattached and the incision is closed up.  RECOVERY  Post-operative care depends on the surgical technique and the preferences of your therapist. Keep the wound clean and dry for the first 10 to 14 days after surgery. Keep your shoulder and arm in the sling provided to you for as long as you have been instructed to. You will be given pain medications by your caregiver. Passive (without using muscles) shoulder movements may be begun when instructed. It is important to follow through with you rehabilitation program in order to have the best possible recovery. RETURN TO SPORTS  The rehabilitation period will depend on the sport and position you play as well as the success of the operation. The minimum recovery period is 6 months. You must have regained complete shoulder motion and strength before returning to sports. SEEK IMMEDIATE MEDICAL CARE IF:  Any medications produce adverse side effects. Any complications from surgery occur: Pain, numbness, or coldness in the extremity operated upon. Discoloration of the nail beds (they become blue or gray) of   the extremity operated upon. Signs of infections (fever, pain, inflammation, redness, or  persistent bleeding).   You have decided to proceed with rotator cuff repair surgery. You have decided not to continue with nonoperative measures such as but not limited to oral medication,   activity modification, physical therapy, bracing, or injection.  We will perform rotator cuff repair. Some of the risks associated with rotator cuff repair include but are not limited to Bleeding Infection Swelling Stiffness Blood clot Pain Re-tearing of the rotator cuff Failure of the rotator cuff to heal   If you're not comfortable with these risks and would like to continue with nonoperative treatment please let Dr. Katheline Brendlinger know prior to your surgery.  

## 2019-03-30 ENCOUNTER — Other Ambulatory Visit: Payer: Self-pay | Admitting: Orthopedic Surgery

## 2019-03-30 MED ORDER — HYDROCODONE-ACETAMINOPHEN 5-325 MG PO TABS: ORAL_TABLET | ORAL | 0 refills | Status: DC

## 2019-03-30 NOTE — Telephone Encounter (Signed)
Patient requests refill on Hydrocodone/Acetaminophen 5-325  Mgs.  Qty  15  Sig: One tablet every six hours for pain. Limit 7 days.  Patient states she uses [facility] on [address]

## 2019-04-17 ENCOUNTER — Other Ambulatory Visit: Payer: Self-pay

## 2019-04-17 ENCOUNTER — Encounter (HOSPITAL_COMMUNITY): Payer: Self-pay

## 2019-04-17 ENCOUNTER — Other Ambulatory Visit (HOSPITAL_COMMUNITY)
Admission: RE | Admit: 2019-04-17 | Discharge: 2019-04-17 | Disposition: A | Discharge status: Home / Self Care | Payer: Medicaid Other | Source: Ambulatory Visit | Attending: Orthopedic Surgery | Admitting: Orthopedic Surgery

## 2019-04-17 ENCOUNTER — Encounter (HOSPITAL_COMMUNITY)
Admission: RE | Admit: 2019-04-17 | Discharge: 2019-04-17 | Disposition: A | Discharge status: Home / Self Care | Payer: Medicaid Other | Source: Ambulatory Visit | Attending: Orthopedic Surgery | Admitting: Orthopedic Surgery

## 2019-04-17 DIAGNOSIS — Z01812 Encounter for preprocedural laboratory examination: Secondary | ICD-10-CM | POA: Insufficient documentation

## 2019-04-17 DIAGNOSIS — Z20822 Contact with and (suspected) exposure to covid-19: Secondary | ICD-10-CM | POA: Insufficient documentation

## 2019-04-17 LAB — CBC WITH DIFFERENTIAL/PLATELET
Abs Immature Granulocytes: 0.03 10*3/uL (ref 0.00–0.07)
Basophils Absolute: 0.1 10*3/uL (ref 0.0–0.1)
Basophils Relative: 1 %
Eosinophils Absolute: 0.1 10*3/uL (ref 0.0–0.5)
Eosinophils Relative: 1 %
HCT: 45.1 % (ref 36.0–46.0)
Hemoglobin: 14.2 g/dL (ref 12.0–15.0)
Immature Granulocytes: 0 %
Lymphocytes Relative: 40 %
Lymphs Abs: 4 10*3/uL (ref 0.7–4.0)
MCH: 26.8 pg (ref 26.0–34.0)
MCHC: 31.5 g/dL (ref 30.0–36.0)
MCV: 85.3 fL (ref 80.0–100.0)
Monocytes Absolute: 0.6 10*3/uL (ref 0.1–1.0)
Monocytes Relative: 6 %
Neutro Abs: 5.1 10*3/uL (ref 1.7–7.7)
Neutrophils Relative %: 52 %
Platelets: 371 10*3/uL (ref 150–400)
RBC: 5.29 MIL/uL — ABNORMAL HIGH (ref 3.87–5.11)
RDW: 13.8 % (ref 11.5–15.5)
WBC: 9.9 10*3/uL (ref 4.0–10.5)
nRBC: 0 % (ref 0.0–0.2)

## 2019-04-17 LAB — BASIC METABOLIC PANEL
Anion gap: 8 (ref 5–15)
BUN: 17 mg/dL (ref 6–20)
CO2: 25 mmol/L (ref 22–32)
Calcium: 9.4 mg/dL (ref 8.9–10.3)
Chloride: 105 mmol/L (ref 98–111)
Creatinine, Ser: 0.79 mg/dL (ref 0.44–1.00)
GFR calc Af Amer: >60 mL/min (ref >60)
GFR calc non Af Amer: >60 mL/min (ref >60)
Glucose, Bld: 143 mg/dL — ABNORMAL HIGH (ref 70–99)
Potassium: 4 mmol/L (ref 3.5–5.1)
Sodium: 138 mmol/L (ref 135–145)

## 2019-04-17 LAB — GLUCOSE, CAPILLARY: Glucose-Capillary: 139 mg/dL — ABNORMAL HIGH (ref 70–99)

## 2019-04-17 LAB — HEMOGLOBIN A1C
Hgb A1c MFr Bld: 8.5 % — ABNORMAL HIGH (ref 4.8–5.6)
Mean Plasma Glucose: 197.25 mg/dL

## 2019-04-17 LAB — SARS CORONAVIRUS 2 (TAT 6-24 HRS): SARS Coronavirus 2: NEGATIVE

## 2019-04-17 NOTE — Patient Instructions (Addendum)
Sandra [name]  04/17/2019     @PREFPERIOPPHARMACY @   Your procedure is scheduled on  04/20/2019 .  Report to [facility] at  [phone]  A.M.  Call this number if you have problems the morning of surgery:  [phone]   Remember:  Do not eat or drink after midnight.                      Take these medicines the morning of surgery with A SIP OF WATER  Protonix, singulair, gabapentin. Take only 1/2 of your usual insulin dosage the night before your procedure. DO NOT take any medications for diabetes the morning of your procedure.    Do not wear jewelry, make-up or nail polish.  Do not wear lotions, powders, or perfumes, or deodorant. Please brush your teeth.  Do not shave 48 hours prior to surgery.  Men may shave face and neck.  Do not bring valuables to the hospital.  [facility] is not responsible for any belongings or valuables.  Contacts, dentures or bridgework may not be worn into surgery.  Leave your suitcase in the car.  After surgery it may be brought to your room.  For patients admitted to the hospital, discharge time will be determined by your treatment team.  Patients discharged the day of surgery will not be allowed to drive home.   Name and phone number of your driver:   family Special instructions:  DO NOT smoke the morning of your procedure.  Please read over the following fact sheets that you were given. Anesthesia Post-op Instructions and Care and Recovery After Surgery       Surgery for Rotator Cuff Tear, Care After This sheet gives you information about how to care for yourself after your procedure. Your health care provider may also give you more specific instructions. If you have problems or questions, contact your health care provider. What can I expect after the procedure? After the procedure, it is common to have:  Swelling.  Pain.  Stiffness.  Tenderness. Follow these instructions at home: If you have a sling or a shoulder  immobilizer:  Wear it as told by your health care provider. Remove it only as told by your health care provider.  Loosen it if your fingers tingle, become numb, or turn cold and blue.  Keep it clean. Bathing  Do not take baths, swim, or use a hot tub until your health care provider approves. Ask your health care provider if you may take showers. You may only be allowed to take sponge baths.  Keep your bandage (dressing) dry until your health care provider says it can be removed.  If your sling or shoulder immobilizer is not waterproof: ? Do not let it get wet. ? Remove it when you take a bath or shower as told by your health care provider. Once the sling or shoulder immobilizer is removed, try not to move your shoulder until your health care provider says that you can. Incision care   Follow instructions from your health care provider about how to take care of your incision. Make sure you: ? Wash your hands with soap and water before and after you change your dressing. If soap and water are not available, use hand sanitizer. ? Change your dressing as told by your health care provider. ? Leave stitches (sutures), skin glue, or adhesive strips in place. These skin closures may need to stay in place  for 2 weeks or longer. If adhesive strip edges start to loosen and curl up, you may trim the loose edges. Do not remove adhesive strips completely unless your health care provider tells you to do that.  Check your incision area every day for signs of infection. Check for: ? More redness, swelling, or pain. ? More fluid or blood. ? Warmth. ? Pus or a bad smell. Managing pain, stiffness, and swelling   If directed, put ice on your shoulder area. ? Put ice in a plastic bag. ? Place a towel between your skin and the bag. ? Leave the ice on for 20 minutes, 2-3 times a day.  Move your fingers often to reduce stiffness and swelling.  Raise (elevate) your upper body on pillows when you lie  down and when you sleep. ? Do not sleep on the front of your body (abdomen). ? Do not sleep on the side that your surgery was performed on. Medicines  Take over-the-counter and prescription medicines only as told by your health care provider.  Ask your health care provider if the medicine prescribed to you: ? Requires you to avoid driving or using heavy machinery. ? Can cause constipation. You may need to take actions to prevent or treat constipation, such as:  Drink enough fluid to keep your urine pale yellow.  Take over-the-counter or prescription medicines.  Eat foods that are high in fiber, such as beans, whole grains, and fresh fruits and vegetables.  Limit foods that are high in fat and processed sugars, such as fried or sweet foods. Driving  Do not drive for 24 hours if you were given a sedative during your procedure.  Do not drive while wearing a sling or a shoulder immobilizer. Ask your health care provider when it is safe to drive. Activity  Do not use your arm to support your body weight until your health care provider says that you can.  Do not lift or hold anything with your arm until your health care provider approves.  Return to your normal activities as told by your health care provider. Ask your health care provider what activities are safe for you.  Do exercises as told by your health care provider. General instructions  Do not use any products that contain nicotine or tobacco, such as cigarettes, e-cigarettes, and chewing tobacco. These can delay healing after surgery. If you need help quitting, ask your health care provider.  Keep all follow-up visits as told by your health care provider. This is important. Contact a health care provider if:  You have a fever.  You have more redness, swelling, or pain around your incision.  You have more fluid or blood coming from your incision.  Your incision feels warm to the touch.  You have pus or a bad smell  coming from your incision.  You have pain that gets worse or does not get better with medicine. Get help right away if:  You have severe pain.  You lose feeling in your arm or hand.  Your hand or fingers turn very pale or blue. Summary  If you have a sling, wear it as told by your health care provider. Remove it only as told by your health care provider.  Change your dressing as told by your health care provider. Check the incision area every day for signs of infection.  If directed, put ice on your shoulder area 2-3 times a day.  Do not use your arm to lift anything or to support  your body weight until your health care provider says that you can. This information is not intended to replace advice given to you by your health care provider. Make sure you discuss any questions you have with your health care provider. Document Revised: 10/04/2017 Document Reviewed: 10/06/2017 Elsevier Patient Education  2020 [place] Anesthesia, Adult, Care After This sheet gives you information about how to care for yourself after your procedure. Your health care provider may also give you more specific instructions. If you have problems or questions, contact your health care provider. What can I expect after the procedure? After the procedure, the following side effects are common:  Pain or discomfort at the IV site.  Nausea.  Vomiting.  Sore throat.  Trouble concentrating.  Feeling cold or chills.  Weak or tired.  Sleepiness and fatigue.  Soreness and body aches. These side effects can affect parts of the body that were not involved in surgery. Follow these instructions at home:  For at least 24 hours after the procedure:  Have a responsible adult stay with you. It is important to have someone help care for you until you are awake and alert.  Rest as needed.  Do not: ? Participate in activities in which you could fall or become injured. ? Drive. ? Use heavy  machinery. ? Drink alcohol. ? Take sleeping pills or medicines that cause drowsiness. ? Make important decisions or sign legal documents. ? Take care of children on your own. Eating and drinking  Follow any instructions from your health care provider about eating or drinking restrictions.  When you feel hungry, start by eating small amounts of foods that are soft and easy to digest (bland), such as toast. Gradually return to your regular diet.  Drink enough fluid to keep your urine pale yellow.  If you vomit, rehydrate by drinking water, juice, or clear broth. General instructions  If you have sleep apnea, surgery and certain medicines can increase your risk for breathing problems. Follow instructions from your health care provider about wearing your sleep device: ? Anytime you are sleeping, including during daytime naps. ? While taking prescription pain medicines, sleeping medicines, or medicines that make you drowsy.  Return to your normal activities as told by your health care provider. Ask your health care provider what activities are safe for you.  Take over-the-counter and prescription medicines only as told by your health care provider.  If you smoke, do not smoke without supervision.  Keep all follow-up visits as told by your health care provider. This is important. Contact a health care provider if:  You have nausea or vomiting that does not get better with medicine.  You cannot eat or drink without vomiting.  You have pain that does not get better with medicine.  You are unable to pass urine.  You develop a skin rash.  You have a fever.  You have redness around your IV site that gets worse. Get help right away if:  You have difficulty breathing.  You have chest pain.  You have blood in your urine or stool, or you vomit blood. Summary  After the procedure, it is common to have a sore throat or nausea. It is also common to feel tired.  Have a responsible  adult stay with you for the first 24 hours after general anesthesia. It is important to have someone help care for you until you are awake and alert.  When you feel hungry, start by eating small amounts of  foods that are soft and easy to digest (bland), such as toast. Gradually return to your regular diet.  Drink enough fluid to keep your urine pale yellow.  Return to your normal activities as told by your health care provider. Ask your health care provider what activities are safe for you. This information is not intended to replace advice given to you by your health care provider. Make sure you discuss any questions you have with your health care provider. Document Revised: 01/01/2017 Document Reviewed: 08/14/2016 Elsevier Patient Education  [place]. How to Use Chlorhexidine for Bathing Chlorhexidine gluconate (CHG) is a germ-killing (antiseptic) solution that is used to clean the skin. It can get rid of the bacteria that normally live on the skin and can keep them away for about 24 hours. To clean your skin with CHG, you may be given:  A CHG solution to use in the shower or as part of a sponge bath.  A prepackaged cloth that contains CHG. Cleaning your skin with CHG may help lower the risk for infection:  While you are staying in the intensive care unit of the hospital.  If you have a vascular access, such as a central line, to provide short-term or long-term access to your veins.  If you have a catheter to drain urine from your bladder.  If you are on a ventilator. A ventilator is a machine that helps you breathe by moving air in and out of your lungs.  After surgery. What are the risks? Risks of using CHG include:  A skin reaction.  Hearing loss, if CHG gets in your ears.  Eye injury, if CHG gets in your eyes and is not rinsed out.  The CHG product catching fire. Make sure that you avoid smoking and flames after applying CHG to your skin. Do not use CHG:  If  you have a chlorhexidine allergy or have previously reacted to chlorhexidine.  On babies younger than 45 months of age. How to use CHG solution  Use CHG only as told by your health care provider, and follow the instructions on the label.  Use the full amount of CHG as directed. Usually, this is one bottle. During a shower Follow these steps when using CHG solution during a shower (unless your health care provider gives you different instructions): 1. Start the shower. 2. Use your normal soap and shampoo to wash your face and hair. 3. Turn off the shower or move out of the shower stream. 4. Pour the CHG onto a clean washcloth. Do not use any type of brush or rough-edged sponge. 5. Starting at your neck, lather your body down to your toes. Make sure you follow these instructions: ? If you will be having surgery, pay special attention to the part of your body where you will be having surgery. Scrub this area for at least 1 minute. ? Do not use CHG on your head or face. If the solution gets into your ears or eyes, rinse them well with water. ? Avoid your genital area. ? Avoid any areas of skin that have broken skin, cuts, or scrapes. ? Scrub your back and under your arms. Make sure to wash skin folds. 6. Let the lather sit on your skin for 1-2 minutes or as long as told by your health care provider. 7. Thoroughly rinse your entire body in the shower. Make sure that all body creases and crevices are rinsed well. 8. Dry off with a clean towel. Do not put  any substances on your body afterward--such as powder, lotion, or perfume--unless you are told to do so by your health care provider. Only use lotions that are recommended by the manufacturer. 9. Put on clean clothes or pajamas. 10. If it is the night before your surgery, sleep in clean sheets.  During a sponge bath Follow these steps when using CHG solution during a sponge bath (unless your health care provider gives you different  instructions): 1. Use your normal soap and shampoo to wash your face and hair. 2. Pour the CHG onto a clean washcloth. 3. Starting at your neck, lather your body down to your toes. Make sure you follow these instructions: ? If you will be having surgery, pay special attention to the part of your body where you will be having surgery. Scrub this area for at least 1 minute. ? Do not use CHG on your head or face. If the solution gets into your ears or eyes, rinse them well with water. ? Avoid your genital area. ? Avoid any areas of skin that have broken skin, cuts, or scrapes. ? Scrub your back and under your arms. Make sure to wash skin folds. 4. Let the lather sit on your skin for 1-2 minutes or as long as told by your health care provider. 5. Using a different clean, wet washcloth, thoroughly rinse your entire body. Make sure that all body creases and crevices are rinsed well. 6. Dry off with a clean towel. Do not put any substances on your body afterward--such as powder, lotion, or perfume--unless you are told to do so by your health care provider. Only use lotions that are recommended by the manufacturer. 7. Put on clean clothes or pajamas. 8. If it is the night before your surgery, sleep in clean sheets. How to use CHG prepackaged cloths  Only use CHG cloths as told by your health care provider, and follow the instructions on the label.  Use the CHG cloth on clean, dry skin.  Do not use the CHG cloth on your head or face unless your health care provider tells you to.  When washing with the CHG cloth: ? Avoid your genital area. ? Avoid any areas of skin that have broken skin, cuts, or scrapes. Before surgery Follow these steps when using a CHG cloth to clean before surgery (unless your health care provider gives you different instructions): 1. Using the CHG cloth, vigorously scrub the part of your body where you will be having surgery. Scrub using a back-and-forth motion for 3 minutes.  The area on your body should be completely wet with CHG when you are done scrubbing. 2. Do not rinse. Discard the cloth and let the area air-dry. Do not put any substances on the area afterward, such as powder, lotion, or perfume. 3. Put on clean clothes or pajamas. 4. If it is the night before your surgery, sleep in clean sheets.  For general bathing Follow these steps when using CHG cloths for general bathing (unless your health care provider gives you different instructions). 1. Use a separate CHG cloth for each area of your body. Make sure you wash between any folds of skin and between your fingers and toes. Wash your body in the following order, switching to a new cloth after each step: ? The front of your neck, shoulders, and chest. ? Both of your arms, under your arms, and your hands. ? Your stomach and groin area, avoiding the genitals. ? Your right leg and foot. ?  Your left leg and foot. ? The back of your neck, your back, and your buttocks. 2. Do not rinse. Discard the cloth and let the area air-dry. Do not put any substances on your body afterward--such as powder, lotion, or perfume--unless you are told to do so by your health care provider. Only use lotions that are recommended by the manufacturer. 3. Put on clean clothes or pajamas. Contact a health care provider if:  Your skin gets irritated after scrubbing.  You have questions about using your solution or cloth. Get help right away if:  Your eyes become very red or swollen.  Your eyes itch badly.  Your skin itches badly and is red or swollen.  Your hearing changes.  You have trouble seeing.  You have swelling or tingling in your mouth or throat.  You have trouble breathing.  You swallow any chlorhexidine. Summary  Chlorhexidine gluconate (CHG) is a germ-killing (antiseptic) solution that is used to clean the skin. Cleaning your skin with CHG may help to lower your risk for infection.  You may be given CHG to  use for bathing. It may be in a bottle or in a prepackaged cloth to use on your skin. Carefully follow your health care provider's instructions and the instructions on the product label.  Do not use CHG if you have a chlorhexidine allergy.  Contact your health care provider if your skin gets irritated after scrubbing. This information is not intended to replace advice given to you by your health care provider. Make sure you discuss any questions you have with your health care provider. Document Revised: 03/17/2018 Document Reviewed: 11/26/2016 Elsevier Patient Education  [place].

## 2019-04-19 NOTE — H&P (Addendum)
PREOP CONSULT/REFERRAL INTRA-OFFICE FROM DR Tyrone Apple         Chief Complaint  Patient presents with  . Shoulder Pain      Left shoulder pain, consult from Dr. Luna Glasgow      44 year old female presents for left rotator cuff repair history of rotator cuff pain shoulder pain unresponsive to nonoperative measures.  She had a CT arthrogram which shows she has a rotator cuff tear supraspinatus tendon   She complains of severe left shoulder pain seems to be located primarily around the deltoid and acromion radiating in the upper arm associated with weakness pain at night and loss of function terms of lifting anything away from her body     Review of Systems  Respiratory: Negative for shortness of breath.   Cardiovascular: Negative for chest pain.    The patient has a bladder stimulator           Past Medical History:  Diagnosis Date  . ADHD (attention deficit hyperactivity disorder)    . Allergic rhinitis    . Anemia      hx of  . Bipolar 1 disorder ([place])    . Bladder tumor    . Cancer ([place])      bladder  . Dizziness    . Full dentures    . GERD (gastroesophageal reflux disease)    . Gross hematuria    . Hyperlipidemia    . Hypertension    . Type 2 diabetes mellitus ([place])    . Urgency of urination      dysuria, sui  . Wears glasses             Past Surgical History:  Procedure Laterality Date  . CYSTOSCOPY N/A 10/03/2015    Procedure: CYSTOSCOPY;  Surgeon: Irine Seal, MD;  Location: [facility];  Service: Urology;  Laterality: N/A;  . CYSTOSCOPY WITH STENT PLACEMENT Right 10/31/2015    Procedure: CYSTOSCOPY WITH ATTEMPTED RIGHT URETERAL OPENING;  Surgeon: Irine Seal, MD;  Location: [facility];  Service: Urology;  Laterality: Right;  . INTERSTIM IMPLANT PLACEMENT N/A 03/14/2018    Procedure: Barrie Lyme IMPLANT FIRST STAGE;  Surgeon: Cleon Gustin, MD;  Location: [facility];  Service: Urology;  Laterality: N/A;  . INTERSTIM IMPLANT  PLACEMENT N/A 03/30/2018    Procedure: Barrie Lyme IMPLANT SECOND STAGE;  Surgeon: Cleon Gustin, MD;  Location: [facility];  Service: Urology;  Laterality: N/A;  . MULTIPLE TOOTH EXTRACTIONS   2015  . OVARIAN CYST REMOVAL Right 2004 approx  . TONSILLECTOMY   12/30/2004  . TOTAL ABDOMINAL HYSTERECTOMY W/ BILATERAL SALPINGOOPHORECTOMY   05/16/2004  . TRANSURETHRAL RESECTION OF BLADDER TUMOR N/A 10/03/2015    Procedure: TRANSURETHRAL RESECTION OF BLADDER TUMOR (TURBT);  Surgeon: Irine Seal, MD;  Location: [facility];  Service: Urology;  Laterality: N/A;  . TRANSURETHRAL RESECTION OF BLADDER TUMOR N/A 10/31/2015    Procedure: RE-STAGINGG TRANSURETHRAL RESECTION OF BLADDER TUMOR (TURBT);  Surgeon: Irine Seal, MD;  Location: [facility];  Service: Urology;  Laterality: N/A;  . TRANSURETHRAL RESECTION OF BLADDER TUMOR N/A 08/17/2016    Procedure: TRANSURETHRAL RESECTION OF BLADDER TUMOR (TURBT);  Surgeon: Cleon Gustin, MD;  Location: [facility];  Service: Urology;  Laterality: N/A;           Family History  Problem Relation Age of Onset  . Hypertension Mother    . Cancer Mother    . Breast cancer Maternal Aunt    . Lung  cancer Maternal Uncle    . Cancer Other    . Diabetes Daughter      Social History         Tobacco Use  . Smoking status: Current Every Day Smoker      Packs/day: 0.50      Types: Cigarettes  . Smokeless tobacco: Never Used  Substance Use Topics  . Alcohol use: No  . Drug use: No           Allergies  Allergen Reactions  . Benazepril Hives  . Ibuprofen Hives      itching  . Metronidazole Nausea And Vomiting  . Oxycodone        Pt has tolerated hydromorphone in the past and takes Norco at home.  . Adhesive [Tape] Rash  . Codeine Nausea And Vomiting and Rash  . Loratadine Rash  . Nystatin-Triamcinolone Rash  . Penicillins Rash      Has patient had a PCN reaction causing immediate rash, facial/tongue/throat swelling, SOB or  lightheadedness with hypotension: No Has patient had a PCN reaction causing severe rash involving mucus membranes or skin necrosis: Yes Has patient had a PCN reaction that required hospitalization: No Has patient had a PCN reaction occurring within the last 10 years: no   If all of the above answers are "NO", then may proceed with Cephalosporin use.        Active Medications  Current Meds  Medication Sig  . aspirin-acetaminophen-caffeine (EXCEDRIN MIGRAINE) 250-250-65 MG tablet Take 2 tablets by mouth 3 (three) times daily as needed for migraine.   . Blood Glucose Monitoring Suppl (ACCU-CHEK GUIDE) w/Device KIT 1 Piece by Does not apply route as directed.  . Blood Glucose Monitoring Suppl (ACCU-CHEK GUIDE) w/Device KIT 1 Piece by Does not apply route as directed.  . cloNIDine (CATAPRES) 0.1 MG tablet Take 0.1 mg by mouth 2 (two) times daily.  . cyclobenzaprine (FLEXERIL) 5 MG tablet Take 1 tablet (5 mg total) by mouth 3 (three) times daily as needed.  Marland Kitchen FLUoxetine (PROZAC) 20 MG capsule Take 40 mg by mouth at bedtime.  . gabapentin (NEURONTIN) 300 MG capsule Take 1 capsule (300 mg total) by mouth 3 (three) times daily.  Marland Kitchen glipiZIDE (GLUCOTROL XL) 5 MG 24 hr tablet Take 1 tablet (5 mg total) by mouth daily with breakfast.  . glucose blood (ACCU-CHEK GUIDE) test strip Use as instructed  . HYDROcodone-acetaminophen (NORCO/VICODIN) 5-325 MG tablet One tablet every six hours for pain.  Limit 7 days.  . Insulin Degludec (TRESIBA FLEXTOUCH) 200 UNIT/ML SOPN Inject 30 Units into the skin at bedtime.  . Insulin Pen Needle (B-D ULTRAFINE III SHORT PEN) 31G X 8 MM MISC 1 each by Does not apply route as directed.  . metFORMIN (GLUCOPHAGE) 1000 MG tablet Take 1 tablet (1,000 mg total) by mouth 2 (two) times daily with a meal.  . pantoprazole (PROTONIX) 40 MG tablet Take 40 mg by mouth at bedtime.   . rosuvastatin (CRESTOR) 20 MG tablet Take 1 tablet (20 mg total) by mouth daily.  . [DISCONTINUED]  HYDROcodone-acetaminophen (NORCO/VICODIN) 5-325 MG tablet One tablet every six hours for pain.  Limit 7 days.    Current Facility-Administered Medications for the 02/28/19 encounter (Office Visit) with Carole Civil, MD  Medication  . betamethasone acetate-betamethasone sodium phosphate (CELESTONE) injection 3 mg        BP 130/84   Pulse 83   Temp (!) 96.8 F (36 C)   Ht '4\' 11"'  (1.499 m)  Wt 150 lb (68 kg)   BMI 30.30 kg/m    Physical Exam Vitals and nursing note reviewed.  Constitutional:      Appearance: Normal appearance.  Neurological:     Mental Status: She is alert and oriented to person, place, and time.  Psychiatric:        Mood and Affect: Mood normal.        Right Shoulder Exam    Range of Motion  The patient has normal right shoulder ROM.   Muscle Strength  The patient has normal right shoulder strength.   Other  Erythema: absent Scars: absent Sensation: normal Pulse: present     Left Shoulder Exam    Tenderness  Left shoulder tenderness location: deltoid.   Range of Motion  Active abduction: abnormal  Passive abduction: abnormal  Extension: 20  External rotation: 40  Forward flexion: 90    Muscle Strength  Abduction: 4/5  Internal rotation: 5/5  External rotation: 5/5  Subscapularis: 5/5  Biceps: 5/5    Other  Erythema: absent Scars: absent Sensation: normal Pulse: present    Comments:  Apprehension could not assess cross chest crossarm could not assess drop arm could not get the arm past 90 degrees.  Sulcus sign normal           MEDICAL DECISION SECTION      My independent reading of xrays: CT arthrogram shows a rotator cuff tear and the x-rays show no glenohumeral arthritis         Encounter Diagnoses  Name Primary?  . Tobacco use Yes  . Complete tear of left rotator cuff, unspecified whether traumatic          PLAN:    Surgical procedure planned: OPEN rotator cuff repair left shoulder    Patient's  diabetes and smoking history patient are at increased risk for failure of healing of the cuff repair  This puts her at increased risk of failed cuff repair.    The procedure has been fully reviewed with the patient; The risks and benefits of surgery have been discussed and explained and understood. Alternative treatment has also been reviewed, questions were encouraged and answered. The postoperative plan is also been reviewed.   Nonsurgical treatment as described in the history and physical section was attempted and unsuccessful and the patient has agreed to proceed with surgical intervention to improve their situation.    Arther Abbott, MD 12:37 PM 04/19/2019

## 2019-04-20 ENCOUNTER — Ambulatory Visit (HOSPITAL_COMMUNITY): Payer: Medicaid Other | Admitting: Anesthesiology

## 2019-04-20 ENCOUNTER — Ambulatory Visit (HOSPITAL_COMMUNITY)
Admission: RE | Admit: 2019-04-20 | Discharge: 2019-04-20 | Disposition: A | Discharge status: Home / Self Care | Payer: Medicaid Other | Attending: Orthopedic Surgery | Admitting: Orthopedic Surgery

## 2019-04-20 ENCOUNTER — Encounter (HOSPITAL_COMMUNITY): Payer: Self-pay | Admitting: Orthopedic Surgery

## 2019-04-20 ENCOUNTER — Encounter (HOSPITAL_COMMUNITY)
Admission: RE | Disposition: A | Discharge status: Home / Self Care | Payer: Self-pay | Source: Home / Self Care | Attending: Orthopedic Surgery

## 2019-04-20 DIAGNOSIS — E119 Type 2 diabetes mellitus without complications: Secondary | ICD-10-CM | POA: Diagnosis not present

## 2019-04-20 DIAGNOSIS — E785 Hyperlipidemia, unspecified: Secondary | ICD-10-CM | POA: Insufficient documentation

## 2019-04-20 DIAGNOSIS — K219 Gastro-esophageal reflux disease without esophagitis: Secondary | ICD-10-CM | POA: Diagnosis not present

## 2019-04-20 DIAGNOSIS — M75122 Complete rotator cuff tear or rupture of left shoulder, not specified as traumatic: Secondary | ICD-10-CM

## 2019-04-20 DIAGNOSIS — M75102 Unspecified rotator cuff tear or rupture of left shoulder, not specified as traumatic: Secondary | ICD-10-CM | POA: Diagnosis present

## 2019-04-20 DIAGNOSIS — I10 Essential (primary) hypertension: Secondary | ICD-10-CM | POA: Diagnosis not present

## 2019-04-20 DIAGNOSIS — Z794 Long term (current) use of insulin: Secondary | ICD-10-CM | POA: Diagnosis not present

## 2019-04-20 DIAGNOSIS — Z8551 Personal history of malignant neoplasm of bladder: Secondary | ICD-10-CM | POA: Insufficient documentation

## 2019-04-20 DIAGNOSIS — Z79899 Other long term (current) drug therapy: Secondary | ICD-10-CM | POA: Diagnosis not present

## 2019-04-20 DIAGNOSIS — F17210 Nicotine dependence, cigarettes, uncomplicated: Secondary | ICD-10-CM | POA: Diagnosis not present

## 2019-04-20 DIAGNOSIS — F319 Bipolar disorder, unspecified: Secondary | ICD-10-CM | POA: Insufficient documentation

## 2019-04-20 HISTORY — PX: SHOULDER OPEN ROTATOR CUFF REPAIR: SHX2407

## 2019-04-20 LAB — GLUCOSE, CAPILLARY
Glucose-Capillary: 125 mg/dL — ABNORMAL HIGH (ref 70–99)
Glucose-Capillary: 86 mg/dL (ref 70–99)
Glucose-Capillary: 151 mg/dL — ABNORMAL HIGH (ref 70–99)

## 2019-04-20 SURGERY — REPAIR, ROTATOR CUFF, OPEN
Anesthesia: General | Site: Shoulder | Laterality: Left

## 2019-04-20 MED ORDER — PREGABALIN 50 MG PO CAPS
ORAL_CAPSULE | ORAL | Status: AC
  Filled 2019-04-20: qty 1

## 2019-04-20 MED ORDER — LACTATED RINGERS IV SOLN: INTRAVENOUS | Status: DC | PRN

## 2019-04-20 MED ORDER — SUGAMMADEX SODIUM 200 MG/2ML IV SOLN
INTRAVENOUS | Status: DC | PRN
  Administered 2019-04-20: 140 mg via INTRAVENOUS

## 2019-04-20 MED ORDER — PROMETHAZINE HCL 25 MG/ML IJ SOLN: 6.2500 mg | INTRAMUSCULAR | Status: DC | PRN

## 2019-04-20 MED ORDER — VANCOMYCIN HCL IN DEXTROSE 1-5 GM/200ML-% IV SOLN
1000.0000 mg | INTRAVENOUS | Status: AC
  Administered 2019-04-20: 08:00:00 1000 mg via INTRAVENOUS
  Filled 2019-04-20: qty 200

## 2019-04-20 MED ORDER — OXYCODONE HCL 5 MG PO TABS
5.0000 mg | ORAL_TABLET | Freq: Once | ORAL | Status: AC
  Administered 2019-04-20: 10:00:00 5 mg via ORAL

## 2019-04-20 MED ORDER — FENTANYL CITRATE (PF) 100 MCG/2ML IJ SOLN
INTRAMUSCULAR | Status: DC | PRN
  Administered 2019-04-20 (×2): 50 ug via INTRAVENOUS

## 2019-04-20 MED ORDER — PROMETHAZINE HCL 12.5 MG PO TABS: 12.5000 mg | ORAL_TABLET | Freq: Four times a day (QID) | ORAL | 0 refills | Status: DC | PRN

## 2019-04-20 MED ORDER — CHLORHEXIDINE GLUCONATE 4 % EX LIQD: 60.0000 mL | Freq: Once | CUTANEOUS | Status: DC

## 2019-04-20 MED ORDER — ONDANSETRON HCL 4 MG/2ML IJ SOLN
INTRAMUSCULAR | Status: AC
  Filled 2019-04-20: qty 2

## 2019-04-20 MED ORDER — MIDAZOLAM HCL 2 MG/2ML IJ SOLN
INTRAMUSCULAR | Status: AC
  Filled 2019-04-20: qty 2

## 2019-04-20 MED ORDER — LIDOCAINE HCL (CARDIAC) PF 100 MG/5ML IV SOSY
PREFILLED_SYRINGE | INTRAVENOUS | Status: DC | PRN
  Administered 2019-04-20: 60 mg via INTRAVENOUS

## 2019-04-20 MED ORDER — LIDOCAINE HCL (PF) 1 % IJ SOLN
INTRAMUSCULAR | Status: AC
  Filled 2019-04-20: qty 30

## 2019-04-20 MED ORDER — CYCLOBENZAPRINE HCL 5 MG PO TABS: 5.0000 mg | ORAL_TABLET | Freq: Three times a day (TID) | ORAL | 0 refills | Status: DC | PRN

## 2019-04-20 MED ORDER — PHENYLEPHRINE 40 MCG/ML (10ML) SYRINGE FOR IV PUSH (FOR BLOOD PRESSURE SUPPORT)
PREFILLED_SYRINGE | INTRAVENOUS | Status: AC
  Filled 2019-04-20: qty 10

## 2019-04-20 MED ORDER — HYDROMORPHONE HCL 1 MG/ML IJ SOLN
0.2500 mg | INTRAMUSCULAR | Status: DC | PRN
  Administered 2019-04-20: 10:00:00 0.5 mg via INTRAVENOUS
  Filled 2019-04-20: qty 0.5

## 2019-04-20 MED ORDER — ROPIVACAINE HCL 5 MG/ML IJ SOLN
INTRAMUSCULAR | Status: AC
  Filled 2019-04-20: qty 30

## 2019-04-20 MED ORDER — SUCCINYLCHOLINE CHLORIDE 20 MG/ML IJ SOLN
INTRAMUSCULAR | Status: DC | PRN
  Administered 2019-04-20: 120 mg via INTRAVENOUS

## 2019-04-20 MED ORDER — DEXAMETHASONE SODIUM PHOSPHATE 4 MG/ML IJ SOLN
INTRAMUSCULAR | Status: DC | PRN
  Administered 2019-04-20: 6 mg via PERINEURAL

## 2019-04-20 MED ORDER — EPHEDRINE SULFATE-NACL 50-0.9 MG/10ML-% IV SOSY
PREFILLED_SYRINGE | INTRAVENOUS | Status: DC | PRN
  Administered 2019-04-20 (×3): 10 mg via INTRAVENOUS

## 2019-04-20 MED ORDER — FENTANYL CITRATE (PF) 100 MCG/2ML IJ SOLN
INTRAMUSCULAR | Status: AC
  Filled 2019-04-20: qty 2

## 2019-04-20 MED ORDER — PHENYLEPHRINE 40 MCG/ML (10ML) SYRINGE FOR IV PUSH (FOR BLOOD PRESSURE SUPPORT)
PREFILLED_SYRINGE | INTRAVENOUS | Status: DC | PRN
  Administered 2019-04-20 (×4): 40 ug via INTRAVENOUS

## 2019-04-20 MED ORDER — PROPOFOL 10 MG/ML IV BOLUS
INTRAVENOUS | Status: AC
  Filled 2019-04-20: qty 40

## 2019-04-20 MED ORDER — ONDANSETRON HCL 4 MG/2ML IJ SOLN
INTRAMUSCULAR | Status: DC | PRN
  Administered 2019-04-20: 4 mg via INTRAVENOUS

## 2019-04-20 MED ORDER — DEXTROSE 50 % IV SOLN
12.5000 g | Freq: Once | INTRAVENOUS | Status: AC
  Administered 2019-04-20: 07:00:00 12.5 g via INTRAVENOUS

## 2019-04-20 MED ORDER — METHOCARBAMOL 1000 MG/10ML IJ SOLN
500.0000 mg | Freq: Once | INTRAVENOUS | Status: DC
  Filled 2019-04-20: qty 5

## 2019-04-20 MED ORDER — DEXAMETHASONE SODIUM PHOSPHATE 4 MG/ML IJ SOLN
INTRAMUSCULAR | Status: AC
  Filled 2019-04-20: qty 1

## 2019-04-20 MED ORDER — HYDROCODONE-ACETAMINOPHEN 10-325 MG PO TABS: 1.0000 | ORAL_TABLET | ORAL | 0 refills | Status: DC | PRN

## 2019-04-20 MED ORDER — ONDANSETRON HCL 4 MG/2ML IJ SOLN
4.0000 mg | Freq: Once | INTRAMUSCULAR | Status: AC
  Administered 2019-04-20: 10:00:00 4 mg via INTRAVENOUS

## 2019-04-20 MED ORDER — ROCURONIUM BROMIDE 100 MG/10ML IV SOLN
INTRAVENOUS | Status: DC | PRN
  Administered 2019-04-20: 20 mg via INTRAVENOUS

## 2019-04-20 MED ORDER — MEPERIDINE HCL 50 MG/ML IJ SOLN: 6.2500 mg | INTRAMUSCULAR | Status: DC | PRN

## 2019-04-20 MED ORDER — LIDOCAINE HCL (PF) 1 % IJ SOLN
INTRAMUSCULAR | Status: DC | PRN
  Administered 2019-04-20: 3 mL

## 2019-04-20 MED ORDER — MIDAZOLAM HCL 2 MG/2ML IJ SOLN
2.0000 mg | Freq: Once | INTRAMUSCULAR | Status: AC
  Administered 2019-04-20: 07:00:00 2 mg via INTRAVENOUS
  Filled 2019-04-20: qty 2

## 2019-04-20 MED ORDER — PREGABALIN 50 MG PO CAPS
50.0000 mg | ORAL_CAPSULE | Freq: Once | ORAL | Status: AC
  Administered 2019-04-20: 50 mg via ORAL

## 2019-04-20 MED ORDER — PROPOFOL 10 MG/ML IV BOLUS
INTRAVENOUS | Status: DC | PRN
  Administered 2019-04-20: 180 mg via INTRAVENOUS

## 2019-04-20 MED ORDER — 0.9 % SODIUM CHLORIDE (POUR BTL) OPTIME
TOPICAL | Status: DC | PRN
  Administered 2019-04-20: 1000 mL

## 2019-04-20 MED ORDER — ROPIVACAINE HCL 5 MG/ML IJ SOLN
INTRAMUSCULAR | Status: DC | PRN
  Administered 2019-04-20: 24 mL via PERINEURAL

## 2019-04-20 MED ORDER — EPHEDRINE 5 MG/ML INJ
INTRAVENOUS | Status: AC
  Filled 2019-04-20: qty 10

## 2019-04-20 MED ORDER — LACTATED RINGERS IV SOLN
Freq: Once | INTRAVENOUS | Status: AC
  Administered 2019-04-20: 07:00:00 1000 mL via INTRAVENOUS

## 2019-04-20 MED ORDER — DEXTROSE 50 % IV SOLN
12.5000 g | Freq: Once | INTRAVENOUS | Status: AC
  Administered 2019-04-20: 08:00:00 12.5 g via INTRAVENOUS

## 2019-04-20 MED ORDER — OXYCODONE HCL 5 MG PO TABS
ORAL_TABLET | ORAL | Status: AC
  Filled 2019-04-20: qty 1

## 2019-04-20 MED ORDER — DEXTROSE 50 % IV SOLN
INTRAVENOUS | Status: AC
  Filled 2019-04-20: qty 50

## 2019-04-20 SURGICAL SUPPLY — 47 items
ANCHOR SUT CORKSCREW 5X15.5 (Anchor) ×2 IMPLANT
ANCHOR SUT CORKSCREW 5X15.5MM (Anchor) ×1 IMPLANT
BENZOIN TINCTURE PRP APPL 2/3 (GAUZE/BANDAGES/DRESSINGS) ×3 IMPLANT
BIT DRILL 2.0X128 (BIT) ×2 IMPLANT
BIT DRILL 2.0X128MM (BIT) ×1
BLADE OSC/SAGITTAL MD 9X18.5 (BLADE) ×3 IMPLANT
CHLORAPREP W/TINT 26 (MISCELLANEOUS) ×3 IMPLANT
CLOSURE WOUND 1/2 X4 (GAUZE/BANDAGES/DRESSINGS) ×1
CLOTH BEACON ORANGE TIMEOUT ST (SAFETY) ×3 IMPLANT
COVER LIGHT HANDLE STERIS (MISCELLANEOUS) ×6 IMPLANT
COVER WAND RF STERILE (DRAPES) ×3 IMPLANT
DRAPE ORTHO 2.5IN SPLIT 77X108 (DRAPES) ×2 IMPLANT
DRAPE ORTHO SPLIT 77X108 STRL (DRAPES) ×6
DRESSING MEPILEX BORDER 6X8 (GAUZE/BANDAGES/DRESSINGS) ×1 IMPLANT
DRSG MEPILEX BORDER 6X8 (GAUZE/BANDAGES/DRESSINGS) ×3
ELECT REM PT RETURN 9FT ADLT (ELECTROSURGICAL) ×3
ELECTRODE REM PT RTRN 9FT ADLT (ELECTROSURGICAL) ×1 IMPLANT
GLOVE BIOGEL PI IND STRL 7.0 (GLOVE) ×3 IMPLANT
GLOVE BIOGEL PI INDICATOR 7.0 (GLOVE) ×6
GLOVE SKINSENSE NS SZ8.0 LF (GLOVE) ×2
GLOVE SKINSENSE STRL SZ8.0 LF (GLOVE) ×1 IMPLANT
GLOVE SS N UNI LF 8.5 STRL (GLOVE) ×3 IMPLANT
GOWN STRL REUS W/TWL LRG LVL3 (GOWN DISPOSABLE) ×6 IMPLANT
GOWN STRL REUS W/TWL XL LVL3 (GOWN DISPOSABLE) ×3 IMPLANT
INST SET MINOR BONE (KITS) ×3 IMPLANT
KIT BLADEGUARD II DBL (SET/KITS/TRAYS/PACK) ×3 IMPLANT
KIT SURGICAL DEVON (SET/KITS/TRAYS/PACK) ×3 IMPLANT
KIT TURNOVER KIT A (KITS) ×3 IMPLANT
MANIFOLD NEPTUNE II (INSTRUMENTS) ×3 IMPLANT
MARKER SKIN DUAL TIP RULER LAB (MISCELLANEOUS) ×6 IMPLANT
NEEDLE MA TROC 1/2 (NEEDLE) IMPLANT
NEEDLE MAYO 6 CRC TAPER PT (NEEDLE) IMPLANT
NEEDLE SCORPION MULTI FIRE (NEEDLE) ×3 IMPLANT
NS IRRIG 1000ML POUR BTL (IV SOLUTION) ×3 IMPLANT
PACK TOTAL JOINT (CUSTOM PROCEDURE TRAY) ×3 IMPLANT
PAD ARMBOARD 7.5X6 YLW CONV (MISCELLANEOUS) ×3 IMPLANT
PENCIL SMOKE EVACUATOR (MISCELLANEOUS) ×3 IMPLANT
PUSHLOCK PEEK 4.5X24 (Orthopedic Implant) ×3 IMPLANT
RASP SM TEAR CROSS CUT (RASP) ×3 IMPLANT
SET BASIN LINEN APH (SET/KITS/TRAYS/PACK) ×3 IMPLANT
SLING ARM FOAM STRAP MED (SOFTGOODS) ×3 IMPLANT
STRIP CLOSURE SKIN 1/2X4 (GAUZE/BANDAGES/DRESSINGS) ×2 IMPLANT
SUT ETHIBOND NAB OS 4 #2 30IN (SUTURE) ×6 IMPLANT
SUT MON AB 0 CT1 (SUTURE) ×3 IMPLANT
SUT MON AB 2-0 CT1 36 (SUTURE) ×3 IMPLANT
SYR BULB IRRIGATION 50ML (SYRINGE) ×3 IMPLANT
YANKAUER SUCT 12FT TUBE ARGYLE (SUCTIONS) ×3 IMPLANT

## 2019-04-20 NOTE — Anesthesia Preprocedure Evaluation (Signed)
Anesthesia Evaluation  Patient identified by MRN, date of birth, ID band Patient awake    Reviewed: Allergy & Precautions, NPO status , Patient's Chart, lab work & pertinent test results  Airway Mallampati: II  TM Distance: >3 FB Neck ROM: Full    Dental  (+) Edentulous Lower, Edentulous Upper   Pulmonary Current Smoker and Patient abstained from smoking.,    Pulmonary exam normal breath sounds clear to auscultation       Cardiovascular Exercise Tolerance: Good hypertension, Pt. on medications  Rhythm:Regular Rate:Normal  16-Dec-2018 23:56:51 [facility] ROUTINE RECORD Sinus rhythm Normal ECG When compared with ECG of 02/24/2018, No significant change was found Confirmed by Delora Fuel ([id number]) on 12/17/2018 12:16:57 AM   Neuro/Psych PSYCHIATRIC DISORDERS Anxiety Depression Bipolar Disorder  Neuromuscular disease    GI/Hepatic GERD  Medicated,  Endo/Other  diabetes, Poorly Controlled, Type 2, Oral Hypoglycemic Agents, Insulin Dependent  Renal/GU  Bladder dysfunction (bladder tumor, interstim)  Bladder tumor, interstim     Musculoskeletal Chronic back pain   Abdominal   Peds  (+) ADHD Hematology  (+) anemia ,   Anesthesia Other Findings   Reproductive/Obstetrics                             Anesthesia Physical Anesthesia Plan  ASA: III  Anesthesia Plan: General   Post-op Pain Management:  Regional for Post-op pain   Induction: Intravenous  PONV Risk Score and Plan: 3  Airway Management Planned: Oral ETT  Additional Equipment:   Intra-op Plan:   Post-operative Plan: Extubation in OR  Informed Consent: I have reviewed the patients History and Physical, chart, labs and discussed the procedure including the risks, benefits and alternatives for the proposed anesthesia with the patient or authorized representative who has indicated his/her understanding and  acceptance.     Dental advisory given  Plan Discussed with: CRNA and Surgeon  Anesthesia Plan Comments:         Anesthesia Quick Evaluation

## 2019-04-20 NOTE — Anesthesia Procedure Notes (Signed)
Anesthesia Regional Block: Interscalene brachial plexus block   Pre-Anesthetic Checklist: ,, timeout performed, Correct Patient, Correct Site, Correct Laterality, Correct Procedure, Correct Position, site marked, Risks and benefits discussed, at surgeon's request and post-op pain management  Laterality: Upper and Left  Prep: chloraprep       Needles:  Injection technique: Single-shot  Needle Type: Echogenic Stimulator Needle     Needle Length: 8.3cm  Needle Gauge: 22     Additional Needles:   Procedures:, nerve stimulator,,, ultrasound used (permanent image in chart),,,,  Narrative:  Start time: 04/20/2019 7:13 AM End time: 04/20/2019 7:23 AM Injection made incrementally with aspirations every 5 mL.  Performed by: Personally  Anesthesiologist: Denese Killings, MD  Additional Notes: Block assessed prior to start of surgery

## 2019-04-20 NOTE — Transfer of Care (Signed)
Immediate Anesthesia Transfer of Care Note  Patient: Sandra [name]  Procedure(s) Performed: ROTATOR CUFF REPAIR SHOULDER OPEN (Left Shoulder)  Patient Location: PACU  Anesthesia Type:General  Level of Consciousness: awake  Airway & Oxygen Therapy: Patient Spontanous Breathing  Post-op Assessment: Report given to RN and Post -op Vital signs reviewed and stable  Post vital signs: Reviewed and stable  Last Vitals:  Vitals Value Taken Time  BP 170/87 04/20/19 0907  Temp    Pulse 86 04/20/19 0912  Resp 18 04/20/19 0912  SpO2 100 % 04/20/19 0912  Vitals shown include unvalidated device data.  Last Pain:  Vitals:   04/20/19 0653  TempSrc: Oral  PainSc: 10-Worst pain ever      Patients Stated Pain Goal: 8 ([id number])  Complications: No apparent anesthesia complications

## 2019-04-20 NOTE — Interval H&P Note (Signed)
History and Physical Interval Note:  04/20/2019 7:18 AM  Sandra [name]  has presented today for surgery, with the diagnosis of torn rotator cuff left shoulder.  The various methods of treatment have been discussed with the patient and family. After consideration of risks, benefits and other options for treatment, the patient has consented to  Procedure(s): ROTATOR CUFF REPAIR SHOULDER OPEN (Left) as a surgical intervention.  The patient's history has been reviewed, patient examined, no change in status, stable for surgery.  I have reviewed the patient's chart and labs.  Questions were answered to the patient's satisfaction.     Arther Abbott

## 2019-04-20 NOTE — Anesthesia Postprocedure Evaluation (Signed)
Anesthesia Post Note  Patient: Sandra [name]  Procedure(s) Performed: ROTATOR CUFF REPAIR SHOULDER OPEN (Left Shoulder)  Patient location during evaluation: PACU Anesthesia Type: General Level of consciousness: awake Pain management: pain level controlled Vital Signs Assessment: post-procedure vital signs reviewed and stable Respiratory status: spontaneous breathing Cardiovascular status: stable Postop Assessment: no apparent nausea or vomiting Anesthetic complications: no     Last Vitals:  Vitals:   04/20/19 0653 04/20/19 0908  BP: (!) 166/99   Pulse: 61 (P) 80  Resp: 18   Temp: 36.8 C   SpO2: 97% (P) 100%    Last Pain:  Vitals:   04/20/19 0653  TempSrc: Oral  PainSc: 10-Worst pain ever                 [employer]

## 2019-04-20 NOTE — Op Note (Signed)
04/20/2019  8:57 AM  PATIENT:  Sandra [name]  44 y.o. female  PRE-OPERATIVE DIAGNOSIS:  torn rotator cuff left shoulder  POST-OPERATIVE DIAGNOSIS:  torn rotator cuff left shoulder  Operative findings 1 cm U-shaped rotator cuff tear with some delamination thick bursal tissue  PROCEDURE:  Procedure(s): ROTATOR CUFF REPAIR SHOULDER OPEN (Left)   23410 rotator cuff repair open no retraction   Implant 5.0 corkscrew anchor with 2 sutures 1, 4.5 push lock   SURGEON:  Surgeon(s) and Role:    Carole Civil, MD - Primary  PHYSICIAN ASSISTANT:   ASSISTANTS: Corrie Dandy  ANESTHESIA:   Interscalene block and general anesthesia  EBL:   Minimal  BLOOD ADMINISTERED:none  DRAINS: none   LOCAL MEDICATIONS USED:  NONE  SPECIMEN:  No Specimen  DISPOSITION OF SPECIMEN:  N/A  COUNTS:  YES  TOURNIQUET:  * No tourniquets in log *  DICTATION: .Dragon Dictation  PLAN OF CARE: Discharge to home after PACU  PATIENT DISPOSITION:  PACU - hemodynamically stable.   Delay start of Pharmacological VTE agent (>24hrs) due to surgical blood loss or risk of bleeding: not applicable  The procedure details  The patient was seen in preop identity confirmed surgical site confirmed chart reviewed and then surgical site marked.  The patient had interscalene block by anesthesia and then was brought to surgery she had 1 g of vancomycin due to penicillin allergies  After intubation she was placed in the modified beachchair position with appropriate padding  After timeout we made an incision over the anterolateral edge of the acromion extended distally 4 cm we divided the subcutaneous tissue and dissected out the deltoid fascia.  Palpation revealed the anterolateral border of the acromion and we made a longitudinal incision preserving the acromial fascia.  We divided the deltoid split down to the underlying bursa which was excised.  The rotator cuff tear was in the supraspinatus tendon it was a  U-shaped tear was 1 cm lateral to medial.  There was some delamination and degeneration of the tendon edge beneath the superficial layer which was debrided  I used a 2.0 drill bit to make 2 drill holes in the tuberosity to assist with healing.  The greater tuberosity was debrided with a power bur and smoothed off with the same.  I then placed a 5.0 corkscrew suture anchor with 2 sutures I passed them in the rotator cuff I then made a punch for the 4.5 push lock and after tying the first 2 sutures brought them across the rotator cuff using the push lock to anchor those sutures to the lateral humerus.  This gave an excellent watertight repair.  I used a #2 Ethibond suture to close the dog ear.  I checked her range of motion it was excellent.  I irrigated the joint and closed the deltoid split with #2 Ethibond.  Closure: 0 Monocryl 3 interrupted sutures followed by two-point 0 Monocryl subcuticular stitch suture with benzoin Steri-Strips.  Patient placed in sling.  Patient extubated taken to recovery.  Postop:  She is allowed full passive range of motion with therapy immediately weeks 0 through 6  Advance weeks 6 through 12 as tolerated to include active assisted range of motion, deltoid progressive resistance exercises  Week 12 through 24 rotator cuff strengthening

## 2019-04-20 NOTE — Brief Op Note (Signed)
04/20/2019  8:57 AM  PATIENT:  Sandra [name]  44 y.o. female  PRE-OPERATIVE DIAGNOSIS:  torn rotator cuff left shoulder  POST-OPERATIVE DIAGNOSIS:  torn rotator cuff left shoulder  Operative findings 1 cm U-shaped rotator cuff tear with some delamination thick bursal tissue  PROCEDURE:  Procedure(s): ROTATOR CUFF REPAIR SHOULDER OPEN (Left)   23410 rotator cuff repair open no retraction   Implant 5.0 corkscrew anchor with 2 sutures 1, 4.5 push lock   SURGEON:  Surgeon(s) and Role:    Carole Civil, MD - Primary  PHYSICIAN ASSISTANT:   ASSISTANTS: Corrie Dandy  ANESTHESIA:   Interscalene block and general anesthesia  EBL:   Minimal  BLOOD ADMINISTERED:none  DRAINS: none   LOCAL MEDICATIONS USED:  NONE  SPECIMEN:  No Specimen  DISPOSITION OF SPECIMEN:  N/A  COUNTS:  YES  TOURNIQUET:  * No tourniquets in log *  DICTATION: .Dragon Dictation  PLAN OF CARE: Discharge to home after PACU  PATIENT DISPOSITION:  PACU - hemodynamically stable.   Delay start of Pharmacological VTE agent (>24hrs) due to surgical blood loss or risk of bleeding: not applicable

## 2019-04-20 NOTE — Anesthesia Procedure Notes (Signed)
Procedure Name: Intubation Date/Time: 04/20/2019 7:41 AM Performed by: Hewitt Blade, CRNA Pre-anesthesia Checklist: Patient identified, Emergency Drugs available, Suction available and Patient being monitored Patient Re-evaluated:Patient Re-evaluated prior to induction Oxygen Delivery Method: Circle system utilized Preoxygenation: Pre-oxygenation with 100% oxygen Induction Type: IV induction Ventilation: Mask ventilation without difficulty Laryngoscope Size: Mac and 3 Grade View: Grade I Tube type: Oral Tube size: 7.0 mm Number of attempts: 1 Airway Equipment and Method: Stylet Placement Confirmation: ETT inserted through vocal cords under direct vision,  positive ETCO2 and breath sounds checked- equal and bilateral Secured at: 20 cm Tube secured with: Tape Dental Injury: Teeth and Oropharynx as per pre-operative assessment

## 2019-04-20 NOTE — Progress Notes (Signed)
Time out for a left shoulder nerve block performed at 0713 by Dr. Dorris Singh . Procedure complete [id number]. Patient tolerated well.

## 2019-04-27 DIAGNOSIS — Z9889 Other specified postprocedural states: Secondary | ICD-10-CM | POA: Insufficient documentation

## 2019-04-28 ENCOUNTER — Other Ambulatory Visit: Payer: Self-pay

## 2019-04-28 ENCOUNTER — Ambulatory Visit (INDEPENDENT_AMBULATORY_CARE_PROVIDER_SITE_OTHER): Payer: Medicaid Other | Admitting: Orthopedic Surgery

## 2019-04-28 VITALS — Temp 97.0°F | Ht 61.0 in | Wt 158.0 lb

## 2019-04-28 DIAGNOSIS — Z9889 Other specified postprocedural states: Secondary | ICD-10-CM

## 2019-04-28 MED ORDER — HYDROCODONE-ACETAMINOPHEN 10-325 MG PO TABS: 1.0000 | ORAL_TABLET | ORAL | 0 refills | Status: AC | PRN

## 2019-04-28 NOTE — Progress Notes (Signed)
Chief Complaint  Patient presents with  . Follow-up    Recheck on left RCR, DOS 04-20-19.    Encounter Diagnosis  Name Primary?  . S/P left rotator cuff repair 04/20/19  Yes    Postop visit #1 status post rotator cuff repair right shoulder reason open technique she had a U-shaped tear 1 cm no retraction we used 1 suture anchor and then 1 push lock to anchor it laterally.  She fell walking her dog.  She is advised not to do that  It looks like the repair is intact as there is no bruising or ecchymosis around the incision no excessive swelling.  Suture line looks good.  She has a lot of spasm.  She can take ibuprofen.  Recommend Flexeril Norco 10 start therapy continue sling use ice rest the arm by taking out of the sling keep it next to her body and and letting her arm stay straight several times a day  Follow-up 3 to 4 weeks  Meds ordered this encounter  Medications  . HYDROcodone-acetaminophen (NORCO) 10-325 MG tablet    Sig: Take 1 tablet by mouth every 4 (four) hours as needed for up to 7 days.    Dispense:  42 tablet    Refill:  0

## 2019-04-28 NOTE — Patient Instructions (Addendum)
Start therapy we will order it   Relax the arm straight 4 x a day   Make sure you are taking norco and flexeril   Apply ice to help with pain and swelling   Wear the sling   You have a new prescription for your pain pill

## 2019-05-01 ENCOUNTER — Other Ambulatory Visit: Payer: Self-pay | Admitting: Orthopedic Surgery

## 2019-05-01 MED ORDER — CYCLOBENZAPRINE HCL 5 MG PO TABS: 5.0000 mg | ORAL_TABLET | Freq: Three times a day (TID) | ORAL | 0 refills | Status: DC | PRN

## 2019-05-01 NOTE — Addendum Note (Signed)
Addended byCandice Camp on: 05/01/2019 08:17 AM   Modules accepted: Orders

## 2019-05-01 NOTE — Telephone Encounter (Signed)
Patient requests refill on Flexeril 5 mgs.  Qty  30      Sig: Take 1 tablet (5 mg total) by mouth 3 (three) times daily as needed for muscle spasms.   Patient states she uses [facility] in [place]

## 2019-05-04 ENCOUNTER — Other Ambulatory Visit: Payer: Self-pay | Admitting: Orthopedic Surgery

## 2019-05-04 ENCOUNTER — Ambulatory Visit (HOSPITAL_COMMUNITY): Payer: Medicaid Other | Attending: Orthopedic Surgery

## 2019-05-04 ENCOUNTER — Encounter (HOSPITAL_COMMUNITY): Payer: Self-pay

## 2019-05-04 ENCOUNTER — Other Ambulatory Visit: Payer: Self-pay

## 2019-05-04 DIAGNOSIS — M25512 Pain in left shoulder: Secondary | ICD-10-CM | POA: Diagnosis present

## 2019-05-04 DIAGNOSIS — R29898 Other symptoms and signs involving the musculoskeletal system: Secondary | ICD-10-CM | POA: Diagnosis present

## 2019-05-04 DIAGNOSIS — M25612 Stiffness of left shoulder, not elsewhere classified: Secondary | ICD-10-CM | POA: Diagnosis present

## 2019-05-04 MED ORDER — HYDROCODONE-ACETAMINOPHEN 7.5-325 MG PO TABS: 1.0000 | ORAL_TABLET | ORAL | 0 refills | Status: DC | PRN

## 2019-05-04 NOTE — Progress Notes (Signed)
Meds ordered this encounter  Medications  . HYDROcodone-acetaminophen (NORCO) 7.5-325 MG tablet    Sig: Take 1 tablet by mouth every 4 (four) hours as needed for up to 7 days for moderate pain.    Dispense:  42 tablet    Refill:  0    Reduction

## 2019-05-04 NOTE — Telephone Encounter (Signed)
     ° °  Patient requests refill on Hydrocodone/Acetaminophen 10-325  Mgs.  Qty  42      Sig: Take 1 tablet by mouth every 4 (four) hours as needed for up to 7 days.   Patient uses [facility] in [place]

## 2019-05-04 NOTE — Therapy (Signed)
[place], [place], [address] Phone: [phone]   Fax:  [phone]  Occupational Therapy Evaluation  Patient Details  Name: Sandra [name] MRN: [id number] Date of Birth: [birth year] Referring Provider (OT): Arther Abbott, MD   Encounter Date: 05/04/2019  OT End of Session - 05/04/19 1539    Visit Number  1    Number of Visits  24    Date for OT Re-Evaluation  07/27/19   mini reassess:06/01/19   Authorization Type  medicaid    Authorization Time Period  requesting 3 visits on 05/04/19    OT Start Time  1430    OT Stop Time  1510    OT Time Calculation (min)  40 min    Activity Tolerance  Patient tolerated treatment well    Behavior During Therapy  [facility] for tasks assessed/performed       Past Medical History:  Diagnosis Date  . ADHD (attention deficit hyperactivity disorder)   . Allergic rhinitis   . Anemia    hx of  . Bipolar 1 disorder ([place])   . Bladder tumor   . Cancer ([place])    bladder  . Dizziness   . Full dentures   . GERD (gastroesophageal reflux disease)   . Gross hematuria   . Hyperlipidemia   . Hypertension   . Type 2 diabetes mellitus ([place])   . Urgency of urination    dysuria, sui  . Wears glasses     Past Surgical History:  Procedure Laterality Date  . CYSTOSCOPY N/A 10/03/2015   Procedure: CYSTOSCOPY;  Surgeon: Irine Seal, MD;  Location: [facility];  Service: Urology;  Laterality: N/A;  . CYSTOSCOPY WITH STENT PLACEMENT Right 10/31/2015   Procedure: CYSTOSCOPY WITH ATTEMPTED RIGHT URETERAL OPENING;  Surgeon: Irine Seal, MD;  Location: [facility];  Service: Urology;  Laterality: Right;  . INTERSTIM IMPLANT PLACEMENT N/A 03/14/2018   Procedure: Barrie Lyme IMPLANT FIRST STAGE;  Surgeon: Cleon Gustin, MD;  Location: [facility];  Service: Urology;  Laterality: N/A;  . INTERSTIM IMPLANT PLACEMENT N/A 03/30/2018   Procedure: Barrie Lyme IMPLANT SECOND STAGE;   Surgeon: Cleon Gustin, MD;  Location: [facility];  Service: Urology;  Laterality: N/A;  . MULTIPLE TOOTH EXTRACTIONS  2015  . OVARIAN CYST REMOVAL Right 2004 approx  . SHOULDER OPEN ROTATOR CUFF REPAIR Left 04/20/2019   Procedure: ROTATOR CUFF REPAIR SHOULDER OPEN;  Surgeon: Carole Civil, MD;  Location: [facility];  Service: Orthopedics;  Laterality: Left;  . TONSILLECTOMY  12/30/2004  . TOTAL ABDOMINAL HYSTERECTOMY W/ BILATERAL SALPINGOOPHORECTOMY  05/16/2004  . TRANSURETHRAL RESECTION OF BLADDER TUMOR N/A 10/03/2015   Procedure: TRANSURETHRAL RESECTION OF BLADDER TUMOR (TURBT);  Surgeon: Irine Seal, MD;  Location: [facility];  Service: Urology;  Laterality: N/A;  . TRANSURETHRAL RESECTION OF BLADDER TUMOR N/A 10/31/2015   Procedure: RE-STAGINGG TRANSURETHRAL RESECTION OF BLADDER TUMOR (TURBT);  Surgeon: Irine Seal, MD;  Location: [facility];  Service: Urology;  Laterality: N/A;  . TRANSURETHRAL RESECTION OF BLADDER TUMOR N/A 08/17/2016   Procedure: TRANSURETHRAL RESECTION OF BLADDER TUMOR (TURBT);  Surgeon: Cleon Gustin, MD;  Location: [facility];  Service: Urology;  Laterality: N/A;    There were no vitals filed for this visit.  Subjective Assessment - 05/04/19 1440    Subjective   S: I fell a year before I even started to have issues with this shoulder.    Pertinent History  Patient  is a 44 y/o female S/P left shoulder RTC repair which was completed on 04/20/19. Dr. Aline Brochure has referred patient to occupational therapy for evaluation and treatment.    Patient Stated Goals  To be able to use her left arm as normal as possible. Return to walking her dog.    Currently in Pain?  Yes    Pain Score  8     Pain Location  Shoulder    Pain Orientation  Left    Pain Descriptors / Indicators  Constant;Throbbing    Pain Type  Surgical pain    Pain Radiating Towards  radiating down into wrist    Pain Onset  1 to 4 weeks ago    Pain Frequency  Constant     Aggravating Factors   Nothing    Pain Relieving Factors  pain medication, muscle relaxers, ice    Effect of Pain on Daily Activities  pt is unable to utilize her left arm at this time.    Multiple Pain Sites  No        [facility] OT Assessment - 05/04/19 1442      Assessment   Medical Diagnosis  Left shoulder RTC repair    Referring Provider (OT)  Arther Abbott, MD    Onset Date/Surgical Date  04/20/19    Hand Dominance  Left    Next MD Visit  05/19/19    Prior Therapy  None      Precautions   Precautions  Shoulder    Type of Shoulder Precautions  Sling on 4-6 weeks. Week 0-6 (4/8-5/20) P/ROM,  Week 6-12 (5/20-6/1) AA/ROM progressing to A/ROM. Week 12+: strengthening    Shoulder Interventions  Shoulder sling/immobilizer;At all times;Off for dressing/bathing/exercises      Restrictions   Weight Bearing Restrictions  Yes      Balance Screen   Has the patient fallen in the past 6 months  Yes    How many times?  1    Has the patient had a decrease in activity level because of a fear of falling?   No      Home  Environment   Family/patient expects to be discharged to:  Private residence    Living Arrangements  Alone      Prior Function   Level of Independence  Independent    Vocation  Unemployed    Leisure  walking her dog, yardwork, housework      ADL   ADL comments  Patient is unable to utilize her LUE for any daily tasks.      Mobility   Mobility Status  Independent      Written Expression   Dominant Hand  Left      Vision - History   Baseline Vision  No visual deficits      Cognition   Overall Cognitive Status  Within Functional Limits for tasks assessed      Observation/Other Assessments   Focus on Therapeutic Outcomes (FOTO)   N/A      Posture/Postural Control   Posture/Postural Control  Postural limitations    Postural Limitations  Rounded Shoulders;Forward head      ROM / Strength   AROM / PROM / Strength  AROM;PROM;Strength      Palpation   Palpation  comment  Min fascial restrictions in the left upper arm. trapezius, scapularis region.       AROM   Overall AROM   Unable to assess;Due to precautions      PROM  Overall PROM Comments  Assessed supine. IR/er adducted    PROM Assessment Site  Shoulder    Right/Left Shoulder  Left    Left Shoulder Flexion  88 Degrees    Left Shoulder ABduction  99 Degrees    Left Shoulder Internal Rotation  90 Degrees    Left Shoulder External Rotation  0 Degrees      Strength   Overall Strength  Due to precautions;Unable to assess                      OT Education - 05/04/19 1538    Education Details  arm position when seated in recliner or on couch, table slides, A/ROM wrist and elbow    Person(s) Educated  Patient    Methods  Explanation;Demonstration;Verbal cues;Handout    Comprehension  Returned demonstration;Verbalized understanding       OT Short Term Goals - 05/04/19 1550      OT SHORT TERM GOAL #1   Title  Patient will be educated and independent with her HEP in order to faciliate her progress in therapy and allow her to begin to use her LUE for daily tasks.    Time  6    Period  Weeks    Status  New    Target Date  06/15/19      OT SHORT TERM GOAL #2   Title  Patient will increaser her LUE P/ROM to [facility] in order to increase ability to complete dressing tasks independently.    Time  6    Period  Weeks    Status  New      OT SHORT TERM GOAL #3   Title  Patient will increase LUE strength to 3+/5 in order to complete waist level tasks without difficulty.    Time  6    Period  Weeks    Status  New      OT SHORT TERM GOAL #4   Title  Patient will report a decrease in pain of approximately 6/10 or less when completing daily tasks.    Time  6    Period  Weeks    Status  New        OT Long Term Goals - 05/04/19 1553      OT LONG TERM GOAL #1   Title  Patient will decrease her left UE fascial restrictions to trace amount in order to increase functional mobility  needed to complete reaching tasks above shoulder level.    Time  12    Period  Weeks    Status  New    Target Date  07/27/19      OT LONG TERM GOAL #2   Title  Patient will increase her LUE A/ROM to [facility] in order to complete household chores and yarwdwork tasks while using her LUE.    Time  12    Period  Weeks    Status  New      OT LONG TERM GOAL #3   Title  Patient will increase her LUE strength to 4+/5 in order to return to walking her dog with increased shoulder and scapular stability.    Time  12    Period  Weeks    Status  New      OT LONG TERM GOAL #4   Title  Patient will report a decrease in pain of approximately 3/10 or less in her LUE while completing daily tasks.    Time  12  Period  Weeks    Status  New            Plan - 05/04/19 1541    Clinical Impression Statement  A: Patient is a 44 y/o female S/P left RTC repair causing increased pain, fascial restrictions, and decreased ROM and strength resulting in difficulty completing any daily tasks using her LUE.    OT Occupational Profile and History  Problem Focused Assessment - Including review of records relating to presenting problem    Occupational performance deficits (Please refer to evaluation for details):  ADL's;IADL's;Rest and Sleep;Leisure    Body Structure / Function / Physical Skills  ADL;ROM;Fascial restriction;Strength;Pain;UE functional use;Mobility    Rehab Potential  Excellent    Clinical Decision Making  Limited treatment options, no task modification necessary    Comorbidities Affecting Occupational Performance:  None    Modification or Assistance to Complete Evaluation   No modification of tasks or assist necessary to complete eval    OT Frequency  2x / week    OT Duration  12 weeks    OT Treatment/Interventions  Self-care/ADL training;Therapeutic exercise;Manual Therapy;Neuromuscular education;Ultrasound;Therapeutic activities;Patient/family education;Passive range of motion;Moist  Heat;Electrical Stimulation;Cryotherapy;DME and/or AE instruction    Plan  P: Patient will benefit from skilled OT services to increase functional use of her LUE during daily tasks. Treatment plan: Follow protocol. Myofascial release, manual stretching, P/ROM , AA/ROM, A/ROM, general strengthening. Modalities PRN.    OT Home Exercise Plan  eval: table slides, A/ROM wrist and elbow    Consulted and Agree with Plan of Care  Patient       Patient will benefit from skilled therapeutic intervention in order to improve the following deficits and impairments:   Body Structure / Function / Physical Skills: ADL, ROM, Fascial restriction, Strength, Pain, UE functional use, Mobility       Visit Diagnosis: Other symptoms and signs involving the musculoskeletal system - Plan: Ot plan of care cert/re-cert  Stiffness of left shoulder, not elsewhere classified - Plan: Ot plan of care cert/re-cert  Acute pain of left shoulder - Plan: Ot plan of care cert/re-cert    Problem List Patient Active Problem List   Diagnosis Date Noted  . S/P left rotator cuff repair 04/20/19  04/27/2019  . Complete tear of left rotator cuff   . Uncontrolled type 2 diabetes mellitus with hyperglycemia ([place]) 10/10/2018  . Mixed hyperlipidemia 10/10/2018  . Sacroiliac joint pain 02/23/2017  . Chronic low back pain 02/09/2017  . Somatic dysfunction of left sacroiliac joint 02/09/2017  . GAD (generalized anxiety disorder) 12/17/2016  . MDD (major depressive disorder), recurrent episode, moderate ([place]) 12/17/2016  . Attention deficit hyperactivity disorder (ADHD) 12/17/2016  . Postoperative anemia due to acute blood loss 11/04/2015  . Bladder cancer ([place]) 10/31/2015  . CARPAL TUNNEL SYNDROME 10/02/2009   Ailene Ravel, OTR/L,CBIS  [phone]  05/04/2019, 3:57 PM  [place] [address] [place], [place], [address] Phone: [phone]   Fax:  [phone]  Name: Sandra [name] MRN: [id number] Date of Birth: 01-Mar-1975

## 2019-05-04 NOTE — Patient Instructions (Signed)
TOWEL SLIDES COMPLETE FOR 1-3 MINUTES, 3-5 TIMES PER DAY  SHOULDER: Flexion On Table   Place hands on table, elbows straight. Move hips away from body. Press hands down into table. Hold ___ seconds. ___ reps per set, ___ sets per day, ___ days per week  Abduction (Passive)   With arm out to side, resting on table, lower head toward arm, keeping trunk away from table. Hold ____ seconds. Repeat ____ times. Do ____ sessions per day.  Copyright  VHI. All rights reserved.     Internal Rotation (Assistive)   Seated with elbow bent at right angle and held against side, slide arm on table surface in an inward arc. Repeat ____ times. Do ____ sessions per day. Activity: Use this motion to brush crumbs off the table.   AROM: Wrist Extension   With right palm down, bend wrist up. Repeat 10____ times per set. Do ____ sets per session. Do __3__ sessions per day.  Copyright  VHI. All rights reserved.   AROM: Wrist Flexion   With right palm up, bend wrist up. Repeat ___10_ times per set. Do ____ sets per session. Do __3__ sessions per day.  Copyright  VHI. All rights reserved.   AROM: Forearm Pronation / Supination   With right arm in handshake position, slowly rotate palm down until stretch is felt. Relax. Then rotate palm up until stretch is felt. Repeat __10__ times per set. Do ____ sets per session. Do __3__ sessions per day.  Copyright  VHI. All rights reserved.   AFlexion (Passive)    OR  Use other hand to bend elbow, with thumb toward same shoulder. Do NOT force this motion. . Repeat _10__ times. Do ____ sessions per day.

## 2019-05-05 NOTE — Telephone Encounter (Signed)
Hydrocodone sent yesterday

## 2019-05-11 ENCOUNTER — Other Ambulatory Visit: Payer: Self-pay | Admitting: Orthopedic Surgery

## 2019-05-11 ENCOUNTER — Encounter (HOSPITAL_COMMUNITY): Payer: Self-pay

## 2019-05-11 ENCOUNTER — Other Ambulatory Visit: Payer: Self-pay

## 2019-05-11 ENCOUNTER — Ambulatory Visit (HOSPITAL_COMMUNITY): Payer: Medicaid Other

## 2019-05-11 DIAGNOSIS — R29898 Other symptoms and signs involving the musculoskeletal system: Secondary | ICD-10-CM | POA: Diagnosis not present

## 2019-05-11 DIAGNOSIS — Z9889 Other specified postprocedural states: Secondary | ICD-10-CM

## 2019-05-11 DIAGNOSIS — M25512 Pain in left shoulder: Secondary | ICD-10-CM

## 2019-05-11 DIAGNOSIS — M25612 Stiffness of left shoulder, not elsewhere classified: Secondary | ICD-10-CM

## 2019-05-11 MED ORDER — HYDROCODONE-ACETAMINOPHEN 7.5-325 MG PO TABS: 1.0000 | ORAL_TABLET | ORAL | 0 refills | Status: DC | PRN

## 2019-05-11 NOTE — Telephone Encounter (Signed)
Patient called for refill: HYDROcodone-acetaminophen (NORCO) 7.5-325 MG tablet 42 tablet  -[place], [place], [place]

## 2019-05-11 NOTE — Therapy (Signed)
[place], [place], [address] Phone: [phone]   Fax:  [phone]  Occupational Therapy Treatment  Patient Details  Name: Sandra [name] MRN: [id number] Date of Birth: 1975/07/25 Referring Provider (OT): Arther Abbott, MD   Encounter Date: 05/11/2019  OT End of Session - 05/11/19 1312    Visit Number  2    Number of Visits  24    Date for OT Re-Evaluation  07/27/19   mini reassess:06/01/19   Authorization Type  medicaid    Authorization Time Period  3 visits (05/11/19-05/24/19)    Authorization - Visit Number  1    Authorization - Number of Visits  3    OT Start Time  1042   Pt arrived late and was outside   OT Stop Time  1108    OT Time Calculation (min)  26 min    Activity Tolerance  Patient tolerated treatment well    Behavior During Therapy  [facility] for tasks assessed/performed       Past Medical History:  Diagnosis Date  . ADHD (attention deficit hyperactivity disorder)   . Allergic rhinitis   . Anemia    hx of  . Bipolar 1 disorder ([place])   . Bladder tumor   . Cancer ([place])    bladder  . Dizziness   . Full dentures   . GERD (gastroesophageal reflux disease)   . Gross hematuria   . Hyperlipidemia   . Hypertension   . Type 2 diabetes mellitus ([place])   . Urgency of urination    dysuria, sui  . Wears glasses     Past Surgical History:  Procedure Laterality Date  . CYSTOSCOPY N/A 10/03/2015   Procedure: CYSTOSCOPY;  Surgeon: Irine Seal, MD;  Location: [facility];  Service: Urology;  Laterality: N/A;  . CYSTOSCOPY WITH STENT PLACEMENT Right 10/31/2015   Procedure: CYSTOSCOPY WITH ATTEMPTED RIGHT URETERAL OPENING;  Surgeon: Irine Seal, MD;  Location: [facility];  Service: Urology;  Laterality: Right;  . INTERSTIM IMPLANT PLACEMENT N/A 03/14/2018   Procedure: Barrie Lyme IMPLANT FIRST STAGE;  Surgeon: Cleon Gustin, MD;  Location[facility];  Service: Urology;   Laterality: N/A;  . INTERSTIM IMPLANT PLACEMENT N/A 03/30/2018   Procedure: Barrie Lyme IMPLANT SECOND STAGE;  Surgeon: Cleon Gustin, MD;  Location[facility];  Service: Urology;  Laterality: N/A;  . MULTIPLE TOOTH EXTRACTIONS  2015  . OVARIAN CYST REMOVAL Right 2004 approx  . SHOULDER OPEN ROTATOR CUFF REPAIR Left 04/20/2019   Procedure: ROTATOR CUFF REPAIR SHOULDER OPEN;  Surgeon: Carole Civil, MD;  Location: [facility];  Service: Orthopedics;  Laterality: Left;  . TONSILLECTOMY  12/30/2004  . TOTAL ABDOMINAL HYSTERECTOMY W/ BILATERAL SALPINGOOPHORECTOMY  05/16/2004  . TRANSURETHRAL RESECTION OF BLADDER TUMOR N/A 10/03/2015   Procedure: TRANSURETHRAL RESECTION OF BLADDER TUMOR (TURBT);  Surgeon: Irine Seal, MD;  Location: [facility];  Service: Urology;  Laterality: N/A;  . TRANSURETHRAL RESECTION OF BLADDER TUMOR N/A 10/31/2015   Procedure: RE-STAGINGG TRANSURETHRAL RESECTION OF BLADDER TUMOR (TURBT);  Surgeon: Irine Seal, MD;  Location: [facility];  Service: Urology;  Laterality: N/A;  . TRANSURETHRAL RESECTION OF BLADDER TUMOR N/A 08/17/2016   Procedure: TRANSURETHRAL RESECTION OF BLADDER TUMOR (TURBT);  Surgeon: Cleon Gustin, MD;  Location: [facility];  Service: Urology;  Laterality: N/A;    There were no vitals filed for this visit.  Subjective Assessment - 05/11/19 1052    Subjective  S: I'm having a lot of muscle spasms.    Currently in Pain?  Yes    Pain Score  6     Pain Location  Shoulder    Pain Orientation  Left    Pain Descriptors / Indicators  Throbbing;Constant    Pain Type  Surgical pain    Pain Radiating Towards  N/A    Pain Onset  1 to 4 weeks ago    Pain Frequency  Constant    Aggravating Factors   Nothing    Pain Relieving Factors  pain medication, muscle relaxers, ice    Effect of Pain on Daily Activities  pt is unable to utilize her left arm at this time.    Multiple Pain Sites  No         [facility] OT Assessment - 05/11/19  1058      Assessment   Medical Diagnosis  Left shoulder RTC repair      Precautions   Precautions  Shoulder    Type of Shoulder Precautions  Sling on 4-6 weeks. Week 0-6 (4/8-5/20) P/ROM,  Week 6-12 (5/20-6/1) AA/ROM progressing to A/ROM. Week 12+: strengthening    Shoulder Interventions  Shoulder sling/immobilizer;At all times;Off for dressing/bathing/exercises               OT Treatments/Exercises (OP) - 05/11/19 1058      Exercises   Exercises  Shoulder      Shoulder Exercises: Seated   Extension  AROM;10 reps    Row  AROM;10 reps    Other Seated Exercises  scapular depression: 10X; A/ROm      Shoulder Exercises: Therapy Ball   Flexion  10 reps    ABduction  10 reps      Shoulder Exercises: ROM/Strengthening   Thumb Tacks  1' low level    Prot/Ret//Elev/Dep  1'      Shoulder Exercises: Isometric Strengthening   Flexion  Supine;3X5"    Extension  Supine;3X5"    External Rotation  Supine;3X5"    Internal Rotation  Supine;3X5"    ABduction  Supine;3X5"    ADduction  Supine;3X5"      Manual Therapy   Manual Therapy  --               OT Short Term Goals - 05/11/19 1316      OT SHORT TERM GOAL #1   Title  Patient will be educated and independent with her HEP in order to faciliate her progress in therapy and allow her to begin to use her LUE for daily tasks.    Time  6    Period  Weeks    Status  On-going    Target Date  06/15/19      OT SHORT TERM GOAL #2   Title  Patient will increaser her LUE P/ROM to [facility] in order to increase ability to complete dressing tasks independently.    Time  6    Period  Weeks    Status  On-going      OT SHORT TERM GOAL #3   Title  Patient will increase LUE strength to 3+/5 in order to complete waist level tasks without difficulty.    Time  6    Period  Weeks    Status  On-going      OT SHORT TERM GOAL #4   Title  Patient will report a decrease in pain of approximately 6/10 or less when completing daily tasks.     Time  6  Period  Weeks    Status  On-going        OT Long Term Goals - 05/11/19 1316      OT LONG TERM GOAL #1   Title  Patient will decrease her left UE fascial restrictions to trace amount in order to increase functional mobility needed to complete reaching tasks above shoulder level.    Time  12    Period  Weeks    Status  On-going      OT LONG TERM GOAL #2   Title  Patient will increase her LUE A/ROM to [facility] in order to complete household chores and yarwdwork tasks while using her LUE.    Time  12    Period  Weeks    Status  On-going      OT LONG TERM GOAL #3   Title  Patient will increase her LUE strength to 4+/5 in order to return to walking her dog with increased shoulder and scapular stability.    Time  12    Period  Weeks    Status  On-going      OT LONG TERM GOAL #4   Title  Patient will report a decrease in pain of approximately 3/10 or less in her LUE while completing daily tasks.    Time  12    Period  Weeks    Status  On-going            Plan - 05/11/19 1313    Clinical Impression Statement  A: Due to time constraint no myofascial release was completed. Initiated manual stretching, isometric exercises, therapy ball stretching, and scapular A/ROM exercises. patient was able to achieve more passive ROM than at evaluation. Reports increased muscle spasms since evaluation. VC for form and technique were provided.    Body Structure / Function / Physical Skills  ADL;ROM;Fascial restriction;Strength;Pain;UE functional use;Mobility    Plan  P: Complete manual techniques for fascial restrictions if needed. Continue with protocol: passive ROM. Add anterior and caudel glides.    Consulted and Agree with Plan of Care  Patient       Patient will benefit from skilled therapeutic intervention in order to improve the following deficits and impairments:   Body Structure / Function / Physical Skills: ADL, ROM, Fascial restriction, Strength, Pain, UE functional use,  Mobility       Visit Diagnosis: Stiffness of left shoulder, not elsewhere classified  Acute pain of left shoulder  Other symptoms and signs involving the musculoskeletal system    Problem List Patient Active Problem List   Diagnosis Date Noted  . S/P left rotator cuff repair 04/20/19  04/27/2019  . Complete tear of left rotator cuff   . Uncontrolled type 2 diabetes mellitus with hyperglycemia ([place]) 10/10/2018  . Mixed hyperlipidemia 10/10/2018  . Sacroiliac joint pain 02/23/2017  . Chronic low back pain 02/09/2017  . Somatic dysfunction of left sacroiliac joint 02/09/2017  . GAD (generalized anxiety disorder) 12/17/2016  . MDD (major depressive disorder), recurrent episode, moderate ([place]) 12/17/2016  . Attention deficit hyperactivity disorder (ADHD) 12/17/2016  . Postoperative anemia due to acute blood loss 11/04/2015  . Bladder cancer ([place]) 10/31/2015  . CARPAL TUNNEL SYNDROME 10/02/2009   Sandra [name], OTR/L,CBIS  [phone]  05/11/2019, 1:16 PM  [place] [address] [place], [place], [address] Phone: [phone]   Fax:  [phone]  Name: Sandra [name] MRN: [id number] Date of Birth: 1975-02-17

## 2019-05-11 NOTE — Progress Notes (Signed)
Meds ordered this encounter  Medications  . HYDROcodone-acetaminophen (NORCO) 7.5-325 MG tablet    Sig: Take 1 tablet by mouth every 4 (four) hours as needed for up to 7 days for moderate pain.    Dispense:  42 tablet    Refill:  0

## 2019-05-11 NOTE — Telephone Encounter (Signed)
I got the request twice, sent one to him, he is in surgery. now I have a third request. We still have the first one for him to review when he is out of surgery.

## 2019-05-11 NOTE — Telephone Encounter (Signed)
Patient called to relay that Mychart message shows that her request for pain medication refill has been denied. States her physical therapist advised her to take her medication 30 minutes prior to therapy. Please advise.

## 2019-05-13 ENCOUNTER — Other Ambulatory Visit: Payer: Self-pay

## 2019-05-15 ENCOUNTER — Ambulatory Visit (INDEPENDENT_AMBULATORY_CARE_PROVIDER_SITE_OTHER): Payer: Medicaid Other | Admitting: "Endocrinology

## 2019-05-15 ENCOUNTER — Other Ambulatory Visit: Payer: Self-pay

## 2019-05-15 ENCOUNTER — Encounter: Payer: Self-pay | Admitting: "Endocrinology

## 2019-05-15 VITALS — BP 141/92 | HR 92 | Ht 61.0 in | Wt 165.6 lb

## 2019-05-15 DIAGNOSIS — I10 Essential (primary) hypertension: Secondary | ICD-10-CM | POA: Diagnosis not present

## 2019-05-15 DIAGNOSIS — E1165 Type 2 diabetes mellitus with hyperglycemia: Secondary | ICD-10-CM

## 2019-05-15 DIAGNOSIS — E782 Mixed hyperlipidemia: Secondary | ICD-10-CM

## 2019-05-15 MED ORDER — GLIPIZIDE ER 5 MG PO TB24: 5.0000 mg | ORAL_TABLET | Freq: Every day | ORAL | 1 refills | Status: DC

## 2019-05-15 MED ORDER — CYCLOBENZAPRINE HCL 5 MG PO TABS: 5.0000 mg | ORAL_TABLET | Freq: Three times a day (TID) | ORAL | 0 refills | Status: DC | PRN

## 2019-05-15 MED ORDER — METFORMIN HCL 1000 MG PO TABS: 1000.0000 mg | ORAL_TABLET | Freq: Two times a day (BID) | ORAL | 1 refills | Status: DC

## 2019-05-15 MED ORDER — INSULIN GLARGINE 100 UNIT/ML SOLOSTAR PEN: 24.0000 [IU] | PEN_INJECTOR | Freq: Every day | SUBCUTANEOUS | 3 refills | Status: DC

## 2019-05-15 MED ORDER — ROSUVASTATIN CALCIUM 20 MG PO TABS: 20.0000 mg | ORAL_TABLET | Freq: Every day | ORAL | 1 refills | Status: DC

## 2019-05-15 NOTE — Progress Notes (Signed)
05/15/2019, 6:58 PM                   Endocrinology follow-up note   Subjective:    Patient ID: Sandra [name], female    DOB: 08-Sep-1975.  Sandra [name] is being seen in follow-up  for management of currently uncontrolled symptomatic type 2  diabetes requested by  Joyice Faster, FNP.   Past Medical History:  Diagnosis Date  . ADHD (attention deficit hyperactivity disorder)   . Allergic rhinitis   . Anemia    hx of  . Bipolar 1 disorder ([place])   . Bladder tumor   . Cancer ([place])    bladder  . Dizziness   . Full dentures   . GERD (gastroesophageal reflux disease)   . Gross hematuria   . Hyperlipidemia   . Hypertension   . Type 2 diabetes mellitus ([place])   . Urgency of urination    dysuria, sui  . Wears glasses     Past Surgical History:  Procedure Laterality Date  . CYSTOSCOPY N/A 10/03/2015   Procedure: CYSTOSCOPY;  Surgeon: Irine Seal, MD;  Location: [facility];  Service: Urology;  Laterality: N/A;  . CYSTOSCOPY WITH STENT PLACEMENT Right 10/31/2015   Procedure: CYSTOSCOPY WITH ATTEMPTED RIGHT URETERAL OPENING;  Surgeon: Irine Seal, MD;  Location: [facility];  Service: Urology;  Laterality: Right;  . INTERSTIM IMPLANT PLACEMENT N/A 03/14/2018   Procedure: Barrie Lyme IMPLANT FIRST STAGE;  Surgeon: Cleon Gustin, MD;  Location: [facility];  Service: Urology;  Laterality: N/A;  . INTERSTIM IMPLANT PLACEMENT N/A 03/30/2018   Procedure: Barrie Lyme IMPLANT SECOND STAGE;  Surgeon: Cleon Gustin, MD;  Location: [facility];  Service: Urology;  Laterality: N/A;  . MULTIPLE TOOTH EXTRACTIONS  2015  . OVARIAN CYST REMOVAL Right 2004 approx  . SHOULDER OPEN ROTATOR CUFF REPAIR Left 04/20/2019   Procedure: ROTATOR CUFF REPAIR SHOULDER OPEN;  Surgeon: Carole Civil, MD;  Location: [facility];  Service: Orthopedics;  Laterality: Left;  . TONSILLECTOMY  12/30/2004  . TOTAL  ABDOMINAL HYSTERECTOMY W/ BILATERAL SALPINGOOPHORECTOMY  05/16/2004  . TRANSURETHRAL RESECTION OF BLADDER TUMOR N/A 10/03/2015   Procedure: TRANSURETHRAL RESECTION OF BLADDER TUMOR (TURBT);  Surgeon: Irine Seal, MD;  Location[facility];  Service: Urology;  Laterality: N/A;  . TRANSURETHRAL RESECTION OF BLADDER TUMOR N/A 10/31/2015   Procedure: RE-STAGINGG TRANSURETHRAL RESECTION OF BLADDER TUMOR (TURBT);  Surgeon: Irine Seal, MD;  Location: [facility];  Service: Urology;  Laterality: N/A;  . TRANSURETHRAL RESECTION OF BLADDER TUMOR N/A 08/17/2016   Procedure: TRANSURETHRAL RESECTION OF BLADDER TUMOR (TURBT);  Surgeon: Cleon Gustin, MD;  Location: [facility];  Service: Urology;  Laterality: N/A;    Social History   Socioeconomic History  . Marital status: Legally Separated    Spouse name: Not on file  . Number of children: Not on file  . Years of education: Not on file  . Highest education level: Not on file  Occupational History  . Not on file  Tobacco Use  . Smoking status: Current Every Day Smoker    Packs/day: 0.50    Types: Cigarettes  .  Smokeless tobacco: Never Used  Substance and Sexual Activity  . Alcohol use: No  . Drug use: No  . Sexual activity: Not Currently    Birth control/protection: Surgical    Comment: hyst  Other Topics Concern  . Not on file  Social History Narrative  . Not on file   Social Determinants of Health   Financial Resource Strain:   . Difficulty of Paying Living Expenses:   Food Insecurity:   . Worried About Charity fundraiser in the Last Year:   . Arboriculturist in the Last Year:   Transportation Needs:   . Film/video editor (Medical):   Marland Kitchen Lack of Transportation (Non-Medical):   Physical Activity:   . Days of Exercise per Week:   . Minutes of Exercise per Session:   Stress:   . Feeling of Stress :   Social Connections:   . Frequency of Communication with Friends and Family:   . Frequency of  Social Gatherings with Friends and Family:   . Attends Religious Services:   . Active Member of Clubs or Organizations:   . Attends Archivist Meetings:   Marland Kitchen Marital Status:     Family History  Problem Relation Age of Onset  . Hypertension Mother   . Cancer Mother   . Breast cancer Maternal Aunt   . Lung cancer Maternal Uncle   . Cancer Other   . Diabetes Daughter     Outpatient Encounter Medications as of 05/15/2019  Medication Sig  . Blood Glucose Monitoring Suppl (ACCU-CHEK GUIDE) w/Device KIT 1 Piece by Does not apply route as directed.  . Blood Glucose Monitoring Suppl (ACCU-CHEK GUIDE) w/Device KIT 1 Piece by Does not apply route as directed.  . cyclobenzaprine (FLEXERIL) 5 MG tablet Take 1 tablet (5 mg total) by mouth 3 (three) times daily as needed for muscle spasms.  Marland Kitchen FLUoxetine (PROZAC) 20 MG capsule Take 20 mg by mouth at bedtime.   . gabapentin (NEURONTIN) 300 MG capsule Take 1 capsule (300 mg total) by mouth 3 (three) times daily.  Marland Kitchen glipiZIDE (GLUCOTROL XL) 5 MG 24 hr tablet Take 1 tablet (5 mg total) by mouth daily with breakfast.  . glucose blood (ACCU-CHEK GUIDE) test strip Use as instructed  . hydrochlorothiazide (MICROZIDE) 12.5 MG capsule Take 12.5 mg by mouth daily.  Marland Kitchen HYDROcodone-acetaminophen (NORCO) 7.5-325 MG tablet Take 1 tablet by mouth every 4 (four) hours as needed for up to 7 days for moderate pain.  Marland Kitchen insulin glargine (LANTUS) 100 UNIT/ML Solostar Pen Inject 24 Units into the skin at bedtime.  . Insulin Pen Needle (B-D ULTRAFINE III SHORT PEN) 31G X 8 MM MISC 1 each by Does not apply route as directed.  Marland Kitchen levocetirizine (XYZAL) 5 MG tablet Take 5 mg by mouth every evening.  . metFORMIN (GLUCOPHAGE) 1000 MG tablet Take 1 tablet (1,000 mg total) by mouth 2 (two) times daily with a meal.  . montelukast (SINGULAIR) 10 MG tablet Take 10 mg by mouth at bedtime.  . pantoprazole (PROTONIX) 40 MG tablet Take 40 mg by mouth at bedtime.   . promethazine  (PHENERGAN) 12.5 MG tablet Take 1 tablet (12.5 mg total) by mouth every 6 (six) hours as needed for nausea or vomiting.  . promethazine (PHENERGAN) 25 MG tablet Take 1 tablet (25 mg total) by mouth every 6 (six) hours as needed for nausea.  . rosuvastatin (CRESTOR) 20 MG tablet Take 1 tablet (20 mg total) by mouth daily.  . [  DISCONTINUED] glipiZIDE (GLUCOTROL XL) 5 MG 24 hr tablet Take 2 tablets (10 mg total) by mouth daily with breakfast. (Patient taking differently: Take 10 mg by mouth in the morning and at bedtime. )  . [DISCONTINUED] Insulin Glargine (LANTUS) 100 UNIT/ML Solostar Pen Inject 30 Units into the skin at bedtime.  . [DISCONTINUED] metFORMIN (GLUCOPHAGE) 1000 MG tablet Take 1 tablet (1,000 mg total) by mouth 2 (two) times daily with a meal.  . [DISCONTINUED] rosuvastatin (CRESTOR) 20 MG tablet Take 1 tablet (20 mg total) by mouth daily.   Facility-Administered Encounter Medications as of 05/15/2019  Medication  . betamethasone acetate-betamethasone sodium phosphate (CELESTONE) injection 3 mg    ALLERGIES: Allergies  Allergen Reactions  . Benazepril Hives  . Ibuprofen Hives    itching  . Metronidazole Nausea And Vomiting  . Oxycodone     Pt has tolerated hydromorphone in the past and takes Norco at home.  . Adhesive [Tape] Rash  . Codeine Nausea And Vomiting and Rash  . Loratadine Rash  . Nystatin-Triamcinolone Rash  . Penicillins Rash    Has patient had a PCN reaction causing immediate rash, facial/tongue/throat swelling, SOB or lightheadedness with hypotension: No Has patient had a PCN reaction causing severe rash involving mucus membranes or skin necrosis: Yes Has patient had a PCN reaction that required hospitalization: No Has patient had a PCN reaction occurring within the last 10 years: no  If all of the above answers are "NO", then may proceed with Cephalosporin use.    VACCINATION STATUS: Immunization History  Administered Date(s) Administered  .  Influenza,inj,Quad PF,6+ Mos 11/01/2015    Diabetes She presents for her follow-up diabetic visit. She has type 2 diabetes mellitus. Onset time: She was diagnosed at approximate age of 75 years with A1c of greater than 15% Her disease course has been improving (She was initiated on Lantus, metformin.  She admits that she has not been consistent taking her Lantus, however she has seen improvement in her glycemic profile.). Pertinent negatives for hypoglycemia include no confusion, headaches, pallor or seizures. Associated symptoms include foot paresthesias. Pertinent negatives for diabetes include no blurred vision, no chest pain, no fatigue, no polydipsia, no polyphagia and no polyuria. Symptoms are improving. Risk factors for coronary artery disease include dyslipidemia, diabetes mellitus, hypertension, sedentary lifestyle and tobacco exposure. Current diabetic treatments: Metformin 1000 mg p.o. twice daily. Her weight is increasing steadily. She is following a generally unhealthy diet. When asked about meal planning, she reported none. She has not had a previous visit with a dietitian. She rarely participates in exercise. Her home blood glucose trend is decreasing steadily. Her breakfast blood glucose range is generally 140-180 mg/dl. Her lunch blood glucose range is generally 140-180 mg/dl. Her dinner blood glucose range is generally 140-180 mg/dl. Her bedtime blood glucose range is generally 180-200 mg/dl. Her overall blood glucose range is 180-200 mg/dl. (She responded favorably to her current regimen of diabetes management.  Her point-of-care A1c is 8.5%, generally improving from 4.1%.     She did have A1c of greater than 15% last year.   ) An ACE inhibitor/angiotensin II receptor blocker is not being taken.  Hyperlipidemia This is a chronic problem. The problem is uncontrolled. Exacerbating diseases include diabetes. Pertinent negatives include no chest pain, myalgias or shortness of breath. She is  currently on no antihyperlipidemic treatment. Risk factors for coronary artery disease include diabetes mellitus, dyslipidemia, a sedentary lifestyle and family history.  Hypertension This is a chronic problem. The problem is  uncontrolled. Pertinent negatives include no blurred vision, chest pain, headaches, palpitations or shortness of breath. Risk factors for coronary artery disease include dyslipidemia and diabetes mellitus. Past treatments include nothing.    Review of systems  Constitutional: + Minimally fluctuating body weight,  current  Body mass index is 31.29 kg/m. , no fatigue, no subjective hyperthermia, no subjective hypothermia Eyes: no blurry vision, no xerophthalmia ENT: no sore throat, no nodules palpated in throat, no dysphagia/odynophagia, no hoarseness Cardiovascular: no Chest Pain, no Shortness of Breath, no palpitations, no leg swelling Respiratory: no cough, no shortness of breath Gastrointestinal: no Nausea/Vomiting/Diarhhea Musculoskeletal: + Left upper extremity in a sling status post shoulder surgery.   aches Skin: no rashes, no hyperemia Neurological: no tremors, no numbness, no tingling, no dizziness Psychiatric: no depression, no anxiety   Objective:    BP (!) 141/92   Pulse 92   Ht _0  (1.549 m)   Wt 165 lb 9.6 oz (75.1 kg)   BMI 31.29 kg/m   Wt Readings from Last 3 Encounters:  05/15/19 165 lb 9.6 oz (75.1 kg)  04/28/19 158 lb (71.7 kg)  04/17/19 153 lb (69.4 kg)     Physical Exam- Limited  Constitutional:  Body mass index is 31.29 kg/m. 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 Neck: Supple Thyroid: No gross goiter Respiratory: Adequate breathing efforts Musculoskeletal: + Left upper extremity in slings status post shoulder surgery.    No gross deformities, strength intact in all four extremities, no gross restriction of joint movements Skin:  no rashes, no  hyperemia Neurological: no tremor with outstretched hands,     CMP ( most recent) CMP     Component Value Date/Time   NA 138 04/17/2019 1427   K 4.0 04/17/2019 1427   CL 105 04/17/2019 1427   CO2 25 04/17/2019 1427   GLUCOSE 143 (H) 04/17/2019 1427   BUN 17 04/17/2019 1427   BUN 9 02/24/2018 0000   CREATININE 0.79 04/17/2019 1427   CALCIUM 9.4 04/17/2019 1427   PROT 6.8 02/24/2018 1923   ALBUMIN 3.4 (L) 02/24/2018 1923   AST 11 (L) 02/24/2018 1923   ALT 16 02/24/2018 1923   ALKPHOS 127 (H) 02/24/2018 1923   BILITOT 0.4 02/24/2018 1923   GFRNONAA >60 04/17/2019 1427   GFRAA >60 04/17/2019 1427     Diabetic Labs (most recent): Lab Results  Component Value Date   HGBA1C 8.5 (H) 04/17/2019   HGBA1C 12.1 (A) 02/15/2019   HGBA1C 11.2 (A) 10/10/2018      Assessment & Plan:   1. Uncontrolled type 2 diabetes mellitus with hyperglycemia ([place])  - Sandra [name] has currently uncontrolled symptomatic type 2 DM since  44 years of age.  She responded favorably to her current regimen of diabetes management.  Her point-of-care A1c is 8.5%, generally improving from 4.1%.     She did have A1c of greater than 15% last year.     - I had a long discussion with her about the progressive nature of diabetes and the pathology behind its complications. -her diabetes is complicated by chronic smoking and she remains at a high risk for more acute and chronic complications which include CAD, CVA, CKD, retinopathy, and neuropathy. These are all discussed in detail with her.  - I have counseled her on diet  and weight management  by adopting a carbohydrate restricted/protein rich diet. Patient is encouraged to switch to  unprocessed or minimally processed     complex starch and increased protein intake (animal or plant source), fruits, and vegetables. -  she is advised to stick to a routine mealtimes to eat 3 meals  a day and avoid unnecessary snacks ( to snack only to correct hypoglycemia).    - she  admits there is a room for improvement in her diet and drink choices. -  Suggestion is made for her to avoid simple carbohydrates  from her diet including Cakes, Sweet Desserts / Pastries, Ice Cream, Soda (diet and regular), Sweet Tea, Candies, Chips, Cookies, Sweet Pastries,  Store Bought Juices, Alcohol in Excess of  1-2 drinks a day, Artificial Sweeteners, Coffee Creamer, and "Sugar-free" Products. This will help patient to have stable blood glucose profile and potentially avoid unintended weight gain.  - she will be scheduled with Jearld Fenton, RDN, CDE for diabetes education.  - I have approached her with the following individualized plan to manage  her diabetes and patient agrees:   -She has been responded to her treatment regimen.  She is advised to continue Lantus 24 units nightly, associated with monitoring of blood glucose twice a day-daily before breakfast and at bedtime.  She is advised to lower glipizide to 5 mg p.o. daily at breakfast, continue Metformin 1000 mg p.o. twice daily.    -She is encouraged to call clinic for readings below 70 or greater than 200 mg per DL x3.   - she is not a suitable candidate for incretin therapy, she will need fasting lipid profile on subsequent visits.  - Specific targets for  A1c;  LDL, HDL, Triglycerides, were discussed with the patient.  2) Blood Pressure /Hypertension: Her blood pressure is not controlled to target.  She is allergic to ACE inhibitors. she is a chronic heavy smoker, counseled  on smoking cessation. She patient is currently on hydrochlorothiazide 12.5 mg p.o. daily, advised to continue.  She will be considered for additional medications on subsequent visits if her blood pressure remains above 130/80 The patient was counseled on the dangers of tobacco use, and was advised to quit.  Reviewed strategies to maximize success, including removing cigarettes and smoking materials from environment.    3)  Lipids/Hyperlipidemia: No recent lipid panel to review.  Given her periorbital xanthelasma, she is advised to continue Crestor 20 mg p.o. nightly.   Side effects and precautions discussed with her.  She will be considered for fasting lipid panel on subsequent visits.  4)  Weight/Diet: She has a BMI of 23.7-SEGBTDV complicating her diabetes care.  She is a candidate for modest weight loss.    - I discussed with her the fact that loss of 5 - 10% of her  current body weight will have the most impact on her diabetes management.  Exercise, and detailed carbohydrates information provided  -  detailed on discharge instructions.  5) Chronic Care/Health Maintenance:  -she  is not on ACEI/ARB and Statin medications and  is encouraged to initiate and continue to follow up with Ophthalmology, Dentist,  Podiatrist at least yearly or according to recommendations, and advised to  Quit smoking. I have recommended yearly flu vaccine and pneumonia vaccine at least every 5 years; moderate intensity exercise for up to 150 minutes weekly; and  sleep for at least 7 hours a day.  - she is  advised to maintain close follow up with Joyice Faster, FNP for primary care needs, as well as her other providers for optimal and coordinated care.  -  Time spent on this patient care encounter:  35 min, of which > 50% was spent in  counseling and the rest reviewing her blood glucose logs , discussing her hypoglycemia and hyperglycemia episodes, reviewing her current and  previous labs / studies  ( including abstraction from other facilities) and medications  doses and developing a  long term treatment plan and documenting her care.   Please refer to Patient Instructions for Blood Glucose Monitoring and Insulin/Medications Dosing Guide"  in media tab for additional information. Please  also refer to " Patient Self Inventory" in the Media  tab for reviewed elements of pertinent patient history.  Sandra [name] participated in the  discussions, expressed understanding, and voiced agreement with the above plans.  All questions were answered to her satisfaction. she is encouraged to contact clinic should she have any questions or concerns prior to her return visit.   Follow up plan: - Return in about 4 months (around 09/15/2019) for Bring Meter and Logs- A1c in Office.  Glade Lloyd, MD [facility] [address] [place], [place] [address] Phone: [phone]  Fax: [phone]    05/15/2019, 6:58 PM  This note was partially dictated with voice recognition software. Similar sounding words can be transcribed inadequately or may not  be corrected upon review.

## 2019-05-15 NOTE — Patient Instructions (Signed)

## 2019-05-16 ENCOUNTER — Encounter (HOSPITAL_COMMUNITY): Payer: Self-pay

## 2019-05-16 ENCOUNTER — Other Ambulatory Visit: Payer: Self-pay

## 2019-05-16 ENCOUNTER — Ambulatory Visit (HOSPITAL_COMMUNITY): Payer: Medicaid Other | Attending: Orthopedic Surgery

## 2019-05-16 DIAGNOSIS — M25512 Pain in left shoulder: Secondary | ICD-10-CM | POA: Insufficient documentation

## 2019-05-16 DIAGNOSIS — R29898 Other symptoms and signs involving the musculoskeletal system: Secondary | ICD-10-CM | POA: Insufficient documentation

## 2019-05-16 DIAGNOSIS — M25612 Stiffness of left shoulder, not elsewhere classified: Secondary | ICD-10-CM | POA: Diagnosis present

## 2019-05-16 NOTE — Therapy (Signed)
[place], [place], [address] Phone: [phone]   Fax:  [phone]  Occupational Therapy Treatment  Patient Details  Name: Sandra [name] MRN: [id number] Date of Birth: 1976-01-12 Referring Provider (OT): Arther Abbott, MD   Encounter Date: 05/16/2019  OT End of Session - 05/16/19 1558    Visit Number  3    Number of Visits  24    Date for OT Re-Evaluation  07/27/19   mini reassess:06/01/19   Authorization Type  medicaid    Authorization Time Period  3 visits (05/11/19-05/24/19)    Authorization - Visit Number  2    Authorization - Number of Visits  3    OT Start Time  1438   Pt arrived late   OT Stop Time  1508    OT Time Calculation (min)  30 min    Activity Tolerance  Patient tolerated treatment well    Behavior During Therapy  [facility] for tasks assessed/performed       Past Medical History:  Diagnosis Date  . ADHD (attention deficit hyperactivity disorder)   . Allergic rhinitis   . Anemia    hx of  . Bipolar 1 disorder ([place])   . Bladder tumor   . Cancer ([place])    bladder  . Dizziness   . Full dentures   . GERD (gastroesophageal reflux disease)   . Gross hematuria   . Hyperlipidemia   . Hypertension   . Type 2 diabetes mellitus ([place])   . Urgency of urination    dysuria, sui  . Wears glasses     Past Surgical History:  Procedure Laterality Date  . CYSTOSCOPY N/A 10/03/2015   Procedure: CYSTOSCOPY;  Surgeon: Irine Seal, MD;  Location: [facility];  Service: Urology;  Laterality: N/A;  . CYSTOSCOPY WITH STENT PLACEMENT Right 10/31/2015   Procedure: CYSTOSCOPY WITH ATTEMPTED RIGHT URETERAL OPENING;  Surgeon: Irine Seal, MD;  Location: [facility];  Service: Urology;  Laterality: Right;  . INTERSTIM IMPLANT PLACEMENT N/A 03/14/2018   Procedure: Barrie Lyme IMPLANT FIRST STAGE;  Surgeon: Cleon Gustin, MD;  Location[facility];  Service: Urology;  Laterality: N/A;  .  INTERSTIM IMPLANT PLACEMENT N/A 03/30/2018   Procedure: Barrie Lyme IMPLANT SECOND STAGE;  Surgeon: Cleon Gustin, MD;  Location: [facility];  Service: Urology;  Laterality: N/A;  . MULTIPLE TOOTH EXTRACTIONS  2015  . OVARIAN CYST REMOVAL Right 2004 approx  . SHOULDER OPEN ROTATOR CUFF REPAIR Left 04/20/2019   Procedure: ROTATOR CUFF REPAIR SHOULDER OPEN;  Surgeon: Carole Civil, MD;  Location: [facility];  Service: Orthopedics;  Laterality: Left;  . TONSILLECTOMY  12/30/2004  . TOTAL ABDOMINAL HYSTERECTOMY W/ BILATERAL SALPINGOOPHORECTOMY  05/16/2004  . TRANSURETHRAL RESECTION OF BLADDER TUMOR N/A 10/03/2015   Procedure: TRANSURETHRAL RESECTION OF BLADDER TUMOR (TURBT);  Surgeon: Irine Seal, MD;  Location: [facility];  Service: Urology;  Laterality: N/A;  . TRANSURETHRAL RESECTION OF BLADDER TUMOR N/A 10/31/2015   Procedure: RE-STAGINGG TRANSURETHRAL RESECTION OF BLADDER TUMOR (TURBT);  Surgeon: Irine Seal, MD;  Location: [facility];  Service: Urology;  Laterality: N/A;  . TRANSURETHRAL RESECTION OF BLADDER TUMOR N/A 08/17/2016   Procedure: TRANSURETHRAL RESECTION OF BLADDER TUMOR (TURBT);  Surgeon: Cleon Gustin, MD;  Location: [facility];  Service: Urology;  Laterality: N/A;    There were no vitals filed for this visit.  Subjective Assessment - 05/16/19 1453    Currently in Pain?  Yes    Pain Score  4     Pain Location  Shoulder    Pain Orientation  Left    Pain Descriptors / Indicators  Constant;Sore    Pain Type  Surgical pain                   OT Treatments/Exercises (OP) - 05/16/19 1500      Exercises   Exercises  Shoulder      Shoulder Exercises: Supine   Protraction  PROM;5 reps    Horizontal ABduction  PROM;5 reps    External Rotation  PROM;5 reps    Internal Rotation  PROM;5 reps    Flexion  PROM;5 reps    ABduction  PROM;5 reps      Shoulder Exercises: Seated   Extension  AROM;10 reps    Row  AROM;10 reps    Other  Seated Exercises  scapular depression: 10X; A/ROm      Shoulder Exercises: Therapy Ball   Flexion  10 reps    ABduction  10 reps      Shoulder Exercises: ROM/Strengthening   Thumb Tacks  1' low level    Anterior Glide  3x10"    Prot/Ret//Elev/Dep  1'      Shoulder Exercises: Isometric Strengthening   Flexion  Supine   3x10"   Extension  Supine   3x10"   External Rotation  Supine   3x10"   Internal Rotation  Supine   3x10"   ABduction  Supine   3x10"   ADduction  Supine   3x10"     Manual Therapy   Manual Therapy  Myofascial release    Manual therapy comments  Manual therapy completed prior to exercises.     Myofascial Release  Myofascial release and manual stretching completed to left UE to decrease fascial restrictions and increase joint mobility in a pain free zone.                OT Short Term Goals - 05/11/19 1316      OT SHORT TERM GOAL #1   Title  Patient will be educated and independent with her HEP in order to faciliate her progress in therapy and allow her to begin to use her LUE for daily tasks.    Time  6    Period  Weeks    Status  On-going    Target Date  06/15/19      OT SHORT TERM GOAL #2   Title  Patient will increaser her LUE P/ROM to [facility] in order to increase ability to complete dressing tasks independently.    Time  6    Period  Weeks    Status  On-going      OT SHORT TERM GOAL #3   Title  Patient will increase LUE strength to 3+/5 in order to complete waist level tasks without difficulty.    Time  6    Period  Weeks    Status  On-going      OT SHORT TERM GOAL #4   Title  Patient will report a decrease in pain of approximately 6/10 or less when completing daily tasks.    Time  6    Period  Weeks    Status  On-going        OT Long Term Goals - 05/11/19 1316      OT LONG TERM GOAL #1   Title  Patient will decrease her left UE fascial restrictions to trace amount  in order to increase functional mobility needed to complete  reaching tasks above shoulder level.    Time  12    Period  Weeks    Status  On-going      OT LONG TERM GOAL #2   Title  Patient will increase her LUE A/ROM to [facility] in order to complete household chores and yarwdwork tasks while using her LUE.    Time  12    Period  Weeks    Status  On-going      OT LONG TERM GOAL #3   Title  Patient will increase her LUE strength to 4+/5 in order to return to walking her dog with increased shoulder and scapular stability.    Time  12    Period  Weeks    Status  On-going      OT LONG TERM GOAL #4   Title  Patient will report a decrease in pain of approximately 3/10 or less in her LUE while completing daily tasks.    Time  12    Period  Weeks    Status  On-going            Plan - 05/16/19 1558    Clinical Impression Statement  A: Pt reports an increase in pain/sorenss in shoulder for the past few days and hasn't been able to tolerate her HEP. Pt demonstrates greater passive ROM this session. Able to tolerate an increase in isometric contraction time. Added anterior glide without difficulty. VC for form and technique were provided.  Manual techniques completed to address fascial restrictions.    Body Structure / Function / Physical Skills  ADL;ROM;Fascial restriction;Strength;Pain;UE functional use;Mobility    Plan  P: Follow up on soreness. Complete manual techniques for fascial restrictions. Continue with protocol. Work on increasing passive ROM tolerance. Request additional visits from medicaid.   Consulted and Agree with Plan of Care  Patient       Patient will benefit from skilled therapeutic intervention in order to improve the following deficits and impairments:   Body Structure / Function / Physical Skills: ADL, ROM, Fascial restriction, Strength, Pain, UE functional use, Mobility       Visit Diagnosis: Other symptoms and signs involving the musculoskeletal system  Acute pain of left shoulder  Stiffness of left shoulder, not  elsewhere classified    Problem List Patient Active Problem List   Diagnosis Date Noted  . Essential hypertension, benign 05/15/2019  . S/P left rotator cuff repair 04/20/19  04/27/2019  . Complete tear of left rotator cuff   . Uncontrolled type 2 diabetes mellitus with hyperglycemia ([place]) 10/10/2018  . Mixed hyperlipidemia 10/10/2018  . Sacroiliac joint pain 02/23/2017  . Chronic low back pain 02/09/2017  . Somatic dysfunction of left sacroiliac joint 02/09/2017  . GAD (generalized anxiety disorder) 12/17/2016  . MDD (major depressive disorder), recurrent episode, moderate ([place]) 12/17/2016  . Attention deficit hyperactivity disorder (ADHD) 12/17/2016  . Postoperative anemia due to acute blood loss 11/04/2015  . Bladder cancer ([place]) 10/31/2015  . CARPAL TUNNEL SYNDROME 10/02/2009   Ailene Ravel, OTR/L,CBIS  [phone]   05/16/2019, 4:01 PM  [place] [address] [place], [place], [address] Phone: [phone]   Fax:  [phone]  Name: Sandra [name] MRN: [id number] Date of Birth: [birth year]

## 2019-05-17 ENCOUNTER — Other Ambulatory Visit: Payer: Self-pay

## 2019-05-17 DIAGNOSIS — Z9889 Other specified postprocedural states: Secondary | ICD-10-CM

## 2019-05-18 MED ORDER — HYDROCODONE-ACETAMINOPHEN 7.5-325 MG PO TABS: 1.0000 | ORAL_TABLET | Freq: Four times a day (QID) | ORAL | 0 refills | Status: DC | PRN

## 2019-05-19 ENCOUNTER — Ambulatory Visit (INDEPENDENT_AMBULATORY_CARE_PROVIDER_SITE_OTHER): Payer: Medicaid Other | Admitting: Orthopedic Surgery

## 2019-05-19 ENCOUNTER — Other Ambulatory Visit: Payer: Self-pay

## 2019-05-19 ENCOUNTER — Encounter: Payer: Self-pay | Admitting: Orthopedic Surgery

## 2019-05-19 DIAGNOSIS — Z9889 Other specified postprocedural states: Secondary | ICD-10-CM

## 2019-05-19 MED ORDER — CYCLOBENZAPRINE HCL 5 MG PO TABS: 5.0000 mg | ORAL_TABLET | Freq: Three times a day (TID) | ORAL | 0 refills | Status: DC | PRN

## 2019-05-19 MED ORDER — CYCLOBENZAPRINE HCL 10 MG PO TABS: 10.0000 mg | ORAL_TABLET | Freq: Three times a day (TID) | ORAL | 1 refills | Status: DC

## 2019-05-19 NOTE — Patient Instructions (Signed)
Therapy

## 2019-05-19 NOTE — Progress Notes (Signed)
Postop  Chief Complaint  Patient presents with  . Routine Post Op    04/20/19 left rotator cuff repair     44 year old female had a rotator cuff repair on on April 8 open cuff repair she had a U-shaped tear 1 cm no retraction we used 1 suture anchor and 1 push lock  She is had about 3 therapy visits. She would like her Flexeril increased to 10 mg we are tapering her hydrocodone starting today.  Follow-up in 6 weeks continue therapy  Encounter Diagnosis  Name Primary?  . S/P left rotator cuff repair Yes

## 2019-05-23 ENCOUNTER — Telehealth (HOSPITAL_COMMUNITY): Payer: Self-pay

## 2019-05-23 ENCOUNTER — Ambulatory Visit (HOSPITAL_COMMUNITY): Payer: Medicaid Other

## 2019-05-23 NOTE — Telephone Encounter (Signed)
Pt called and lmonvm to cancel today's visit.

## 2019-05-24 ENCOUNTER — Other Ambulatory Visit: Payer: Self-pay

## 2019-05-24 DIAGNOSIS — Z9889 Other specified postprocedural states: Secondary | ICD-10-CM

## 2019-05-24 MED ORDER — HYDROCODONE-ACETAMINOPHEN 7.5-325 MG PO TABS: 1.0000 | ORAL_TABLET | Freq: Four times a day (QID) | ORAL | 0 refills | Status: AC | PRN

## 2019-05-25 ENCOUNTER — Ambulatory Visit (HOSPITAL_COMMUNITY): Payer: Medicaid Other

## 2019-05-30 ENCOUNTER — Encounter (HOSPITAL_COMMUNITY): Payer: Self-pay

## 2019-05-30 ENCOUNTER — Ambulatory Visit (HOSPITAL_COMMUNITY): Payer: Medicaid Other | Admitting: Occupational Therapy

## 2019-05-30 ENCOUNTER — Telehealth (HOSPITAL_COMMUNITY): Payer: Self-pay | Admitting: Occupational Therapy

## 2019-05-30 NOTE — Telephone Encounter (Signed)
Left message for pt regarding missed appt today. Reminded of next appt on 5/20 at 1:45 and asked to call and cancel if unable to attend.   Guadelupe Sabin, OTR/L  [phone] 05/30/19

## 2019-05-31 ENCOUNTER — Other Ambulatory Visit: Payer: Self-pay

## 2019-05-31 ENCOUNTER — Other Ambulatory Visit: Payer: Self-pay | Admitting: Orthopedic Surgery

## 2019-05-31 DIAGNOSIS — Z9889 Other specified postprocedural states: Secondary | ICD-10-CM

## 2019-05-31 MED ORDER — HYDROCODONE-ACETAMINOPHEN 5-325 MG PO TABS: 1.0000 | ORAL_TABLET | Freq: Three times a day (TID) | ORAL | 0 refills | Status: DC | PRN

## 2019-05-31 NOTE — Progress Notes (Signed)
Meds ordered this encounter  Medications  . HYDROcodone-acetaminophen (NORCO/VICODIN) 5-325 MG tablet    Sig: Take 1 tablet by mouth every 8 (eight) hours as needed for up to 7 days for moderate pain.    Dispense:  21 tablet    Refill:  0

## 2019-06-01 ENCOUNTER — Ambulatory Visit (HOSPITAL_COMMUNITY): Payer: Medicaid Other | Admitting: Specialist

## 2019-06-01 ENCOUNTER — Other Ambulatory Visit: Payer: Self-pay

## 2019-06-01 ENCOUNTER — Encounter (HOSPITAL_COMMUNITY): Payer: Self-pay | Admitting: Specialist

## 2019-06-01 DIAGNOSIS — R29898 Other symptoms and signs involving the musculoskeletal system: Secondary | ICD-10-CM

## 2019-06-01 DIAGNOSIS — M25512 Pain in left shoulder: Secondary | ICD-10-CM

## 2019-06-01 DIAGNOSIS — M25612 Stiffness of left shoulder, not elsewhere classified: Secondary | ICD-10-CM

## 2019-06-01 NOTE — Therapy (Signed)
[place], [place], [address] Phone: [phone]   Fax:  [phone]  Occupational Therapy Treatment  Patient Details  Name: Sandra [name] MRN: [id number] Date of Birth: Jun 02, 1975 Referring Provider (OT): Arther Abbott, MD   Encounter Date: 06/01/2019  OT End of Session - 06/01/19 2154    Visit Number  4    Number of Visits  24    Date for OT Re-Evaluation  07/27/19   mini reassess on 5/20   Authorization Type  medicaid    Authorization Time Period  medicaid auth OT 12 Visits 5/14-6/24/2021    Authorization - Visit Number  1    Authorization - Number of Visits  12    OT Start Time  8003    OT Stop Time  1430    OT Time Calculation (min)  41 min    Activity Tolerance  Patient tolerated treatment well    Behavior During Therapy  [facility] for tasks assessed/performed       Past Medical History:  Diagnosis Date  . ADHD (attention deficit hyperactivity disorder)   . Allergic rhinitis   . Anemia    hx of  . Bipolar 1 disorder ([place])   . Bladder tumor   . Cancer ([place])    bladder  . Dizziness   . Full dentures   . GERD (gastroesophageal reflux disease)   . Gross hematuria   . Hyperlipidemia   . Hypertension   . Type 2 diabetes mellitus ([place])   . Urgency of urination    dysuria, sui  . Wears glasses     Past Surgical History:  Procedure Laterality Date  . CYSTOSCOPY N/A 10/03/2015   Procedure: CYSTOSCOPY;  Surgeon: Irine Seal, MD;  Location: [facility];  Service: Urology;  Laterality: N/A;  . CYSTOSCOPY WITH STENT PLACEMENT Right 10/31/2015   Procedure: CYSTOSCOPY WITH ATTEMPTED RIGHT URETERAL OPENING;  Surgeon: Irine Seal, MD;  Location: [facility];  Service: Urology;  Laterality: Right;  . INTERSTIM IMPLANT PLACEMENT N/A 03/14/2018   Procedure: Barrie Lyme IMPLANT FIRST STAGE;  Surgeon: Cleon Gustin, MD;  Location[facility];  Service: Urology;  Laterality: N/A;  .  INTERSTIM IMPLANT PLACEMENT N/A 03/30/2018   Procedure: Barrie Lyme IMPLANT SECOND STAGE;  Surgeon: Cleon Gustin, MD;  Location: [facility];  Service: Urology;  Laterality: N/A;  . MULTIPLE TOOTH EXTRACTIONS  2015  . OVARIAN CYST REMOVAL Right 2004 approx  . SHOULDER OPEN ROTATOR CUFF REPAIR Left 04/20/2019   Procedure: ROTATOR CUFF REPAIR SHOULDER OPEN;  Surgeon: Carole Civil, MD;  Location: [facility];  Service: Orthopedics;  Laterality: Left;  . TONSILLECTOMY  12/30/2004  . TOTAL ABDOMINAL HYSTERECTOMY W/ BILATERAL SALPINGOOPHORECTOMY  05/16/2004  . TRANSURETHRAL RESECTION OF BLADDER TUMOR N/A 10/03/2015   Procedure: TRANSURETHRAL RESECTION OF BLADDER TUMOR (TURBT);  Surgeon: Irine Seal, MD;  Location: [facility];  Service: Urology;  Laterality: N/A;  . TRANSURETHRAL RESECTION OF BLADDER TUMOR N/A 10/31/2015   Procedure: RE-STAGINGG TRANSURETHRAL RESECTION OF BLADDER TUMOR (TURBT);  Surgeon: Irine Seal, MD;  Location: [facility];  Service: Urology;  Laterality: N/A;  . TRANSURETHRAL RESECTION OF BLADDER TUMOR N/A 08/17/2016   Procedure: TRANSURETHRAL RESECTION OF BLADDER TUMOR (TURBT);  Surgeon: Cleon Gustin, MD;  Location: [facility];  Service: Urology;  Laterality: N/A;    There were no vitals filed for this visit.  Subjective Assessment - 06/01/19 2154    Subjective  S:  it feels better.  I cant reach too high or behind by body with my arm.    Currently in Pain?  Yes    Pain Score  5     Pain Location  Shoulder    Pain Orientation  Left    Pain Descriptors / Indicators  Aching         [facility] OT Assessment - 06/01/19 0001      Assessment   Medical Diagnosis  Left shoulder RTC repair    Referring Provider (OT)  Arther Abbott, MD      Precautions   Precautions  Shoulder    Type of Shoulder Precautions  Sling on 4-6 weeks. Week 0-6 (4/8-5/20) P/ROM,  Week 6-12 (5/20-6/1) AA/ROM progressing to A/ROM. Week 12+: strengthening                OT Treatments/Exercises (OP) - 06/01/19 0001      Exercises   Exercises  Shoulder      Shoulder Exercises: Supine   Protraction  PROM;5 reps;AAROM;10 reps    Horizontal ABduction  PROM;5 reps;AAROM;10 reps    External Rotation  PROM;5 reps;AAROM;10 reps    Internal Rotation  PROM;5 reps;AAROM;10 reps    Flexion  PROM;5 reps;AAROM;10 reps    ABduction  PROM;5 reps;AAROM;10 reps      Shoulder Exercises: Seated   Extension  AROM;12 reps    Row  AAROM;12 reps      Shoulder Exercises: Pulleys   Flexion  1 minute    ABduction  1 minute      Shoulder Exercises: Therapy Ball   Flexion  15 reps    ABduction  15 reps    Right/Left  5 reps    Right/Left Limitations  with min pa from therapist at posterior point       Shoulder Exercises: ROM/Strengthening   Wall Wash  add next visit    Thumb Tacks  1' chest level    Prot/Ret//Elev/Dep  1'               OT Short Term Goals - 05/11/19 1316      OT SHORT TERM GOAL #1   Title  Patient will be educated and independent with her HEP in order to faciliate her progress in therapy and allow her to begin to use her LUE for daily tasks.    Time  6    Period  Weeks    Status  On-going    Target Date  06/15/19      OT SHORT TERM GOAL #2   Title  Patient will increaser her LUE P/ROM to [facility] in order to increase ability to complete dressing tasks independently.    Time  6    Period  Weeks    Status  On-going      OT SHORT TERM GOAL #3   Title  Patient will increase LUE strength to 3+/5 in order to complete waist level tasks without difficulty.    Time  6    Period  Weeks    Status  On-going      OT SHORT TERM GOAL #4   Title  Patient will report a decrease in pain of approximately 6/10 or less when completing daily tasks.    Time  6    Period  Weeks    Status  On-going        OT Long Term Goals - 05/11/19 1316      OT LONG TERM GOAL #1  Title  Patient will decrease her left UE fascial restrictions  to trace amount in order to increase functional mobility needed to complete reaching tasks above shoulder level.    Time  12    Period  Weeks    Status  On-going      OT LONG TERM GOAL #2   Title  Patient will increase her LUE A/ROM to [facility] in order to complete household chores and yarwdwork tasks while using her LUE.    Time  12    Period  Weeks    Status  On-going      OT LONG TERM GOAL #3   Title  Patient will increase her LUE strength to 4+/5 in order to return to walking her dog with increased shoulder and scapular stability.    Time  12    Period  Weeks    Status  On-going      OT LONG TERM GOAL #4   Title  Patient will report a decrease in pain of approximately 3/10 or less in her LUE while completing daily tasks.    Time  12    Period  Weeks    Status  On-going            Plan - 06/01/19 2159    Clinical Impression Statement  A:  per protocol able to begin aa/rom this date.  completed in supine with minimal difficulties and good from with minimal cuing.    Body Structure / Function / Physical Skills  ADL;ROM;Fascial restriction;Strength;Pain;UE functional use;Mobility    Plan  P:  continue to progress aa/rom until wnl.  attempt aa/rom in standing as tolerated and add aa/rom to HEP if form is good.  complete mini reassessment.       Patient will benefit from skilled therapeutic intervention in order to improve the following deficits and impairments:   Body Structure / Function / Physical Skills: ADL, ROM, Fascial restriction, Strength, Pain, UE functional use, Mobility       Visit Diagnosis: Other symptoms and signs involving the musculoskeletal system  Acute pain of left shoulder  Stiffness of left shoulder, not elsewhere classified    Problem List Patient Active Problem List   Diagnosis Date Noted  . Essential hypertension, benign 05/15/2019  . S/P left rotator cuff repair 04/20/19  04/27/2019  . Complete tear of left rotator cuff   . Uncontrolled type  2 diabetes mellitus with hyperglycemia ([place]) 10/10/2018  . Mixed hyperlipidemia 10/10/2018  . Sacroiliac joint pain 02/23/2017  . Chronic low back pain 02/09/2017  . Somatic dysfunction of left sacroiliac joint 02/09/2017  . GAD (generalized anxiety disorder) 12/17/2016  . MDD (major depressive disorder), recurrent episode, moderate ([place]) 12/17/2016  . Attention deficit hyperactivity disorder (ADHD) 12/17/2016  . Postoperative anemia due to acute blood loss 11/04/2015  . Bladder cancer ([place]) 10/31/2015  . CARPAL TUNNEL SYNDROME 10/02/2009    Vangie Bicker, [place], OTR/L [phone]  06/01/2019, 10:03 PM  [place], [place], [address] Phone: [phone]   Fax:  [phone]  Name: PRAIRIE STENBERG MRN: [id number] Date of Birth: 12-Jan-1976

## 2019-06-06 ENCOUNTER — Ambulatory Visit (HOSPITAL_COMMUNITY): Payer: Medicaid Other | Admitting: Occupational Therapy

## 2019-06-06 ENCOUNTER — Telehealth (HOSPITAL_COMMUNITY): Payer: Self-pay | Admitting: Occupational Therapy

## 2019-06-06 NOTE — Telephone Encounter (Signed)
pt called to cx today's appt due to she is not feeling well 

## 2019-06-07 ENCOUNTER — Other Ambulatory Visit: Payer: Self-pay | Admitting: Orthopedic Surgery

## 2019-06-07 ENCOUNTER — Other Ambulatory Visit: Payer: Self-pay

## 2019-06-07 MED ORDER — HYDROCODONE-ACETAMINOPHEN 5-325 MG PO TABS: 1.0000 | ORAL_TABLET | Freq: Three times a day (TID) | ORAL | 0 refills | Status: DC | PRN

## 2019-06-08 ENCOUNTER — Encounter (HOSPITAL_COMMUNITY): Payer: Medicaid Other | Admitting: Occupational Therapy

## 2019-06-08 ENCOUNTER — Telehealth (HOSPITAL_COMMUNITY): Payer: Self-pay | Admitting: Occupational Therapy

## 2019-06-08 NOTE — Telephone Encounter (Signed)
pt cancelled appt for today 

## 2019-06-13 ENCOUNTER — Encounter (HOSPITAL_COMMUNITY): Payer: Self-pay | Admitting: Occupational Therapy

## 2019-06-13 ENCOUNTER — Other Ambulatory Visit: Payer: Self-pay

## 2019-06-13 ENCOUNTER — Ambulatory Visit (HOSPITAL_COMMUNITY): Payer: Medicaid Other | Attending: Orthopedic Surgery | Admitting: Occupational Therapy

## 2019-06-13 DIAGNOSIS — M25612 Stiffness of left shoulder, not elsewhere classified: Secondary | ICD-10-CM | POA: Diagnosis present

## 2019-06-13 DIAGNOSIS — M25512 Pain in left shoulder: Secondary | ICD-10-CM | POA: Insufficient documentation

## 2019-06-13 DIAGNOSIS — R29898 Other symptoms and signs involving the musculoskeletal system: Secondary | ICD-10-CM

## 2019-06-13 NOTE — Patient Instructions (Signed)

## 2019-06-13 NOTE — Therapy (Signed)
[place], [place], [address] Phone: [phone]   Fax:  [phone]  Occupational Therapy Treatment  Patient Details  Name: Sandra [name] MRN: [id number] Date of Birth: 1975/12/27 Referring Provider (OT): Arther Abbott, MD   Encounter Date: 06/13/2019  OT End of Session - 06/13/19 1658    Visit Number  5    Number of Visits  24    Date for OT Re-Evaluation  07/27/19    Authorization Type  medicaid    Authorization Time Period  medicaid auth OT 12 Visits 5/14-6/24/2021    Authorization - Visit Number  2    Authorization - Number of Visits  12    OT Start Time  [id number]    OT Stop Time  1512    OT Time Calculation (min)  40 min    Activity Tolerance  Patient tolerated treatment well    Behavior During Therapy  [facility] for tasks assessed/performed       Past Medical History:  Diagnosis Date  . ADHD (attention deficit hyperactivity disorder)   . Allergic rhinitis   . Anemia    hx of  . Bipolar 1 disorder ([place])   . Bladder tumor   . Cancer ([place])    bladder  . Dizziness   . Full dentures   . GERD (gastroesophageal reflux disease)   . Gross hematuria   . Hyperlipidemia   . Hypertension   . Type 2 diabetes mellitus ([place])   . Urgency of urination    dysuria, sui  . Wears glasses     Past Surgical History:  Procedure Laterality Date  . CYSTOSCOPY N/A 10/03/2015   Procedure: CYSTOSCOPY;  Surgeon: Irine Seal, MD;  Location: [facility];  Service: Urology;  Laterality: N/A;  . CYSTOSCOPY WITH STENT PLACEMENT Right 10/31/2015   Procedure: CYSTOSCOPY WITH ATTEMPTED RIGHT URETERAL OPENING;  Surgeon: Irine Seal, MD;  Location: [facility];  Service: Urology;  Laterality: Right;  . INTERSTIM IMPLANT PLACEMENT N/A 03/14/2018   Procedure: Barrie Lyme IMPLANT FIRST STAGE;  Surgeon: Cleon Gustin, MD;  [facility];  Service: Urology;  Laterality: N/A;  . INTERSTIM IMPLANT PLACEMENT  N/A 03/30/2018   Procedure: Barrie Lyme IMPLANT SECOND STAGE;  Surgeon: Cleon Gustin, MD;  Location: [facility];  Service: Urology;  Laterality: N/A;  . MULTIPLE TOOTH EXTRACTIONS  2015  . OVARIAN CYST REMOVAL Right 2004 approx  . SHOULDER OPEN ROTATOR CUFF REPAIR Left 04/20/2019   Procedure: ROTATOR CUFF REPAIR SHOULDER OPEN;  Surgeon: Carole Civil, MD;  Location: [facility];  Service: Orthopedics;  Laterality: Left;  . TONSILLECTOMY  12/30/2004  . TOTAL ABDOMINAL HYSTERECTOMY W/ BILATERAL SALPINGOOPHORECTOMY  05/16/2004  . TRANSURETHRAL RESECTION OF BLADDER TUMOR N/A 10/03/2015   Procedure: TRANSURETHRAL RESECTION OF BLADDER TUMOR (TURBT);  Surgeon: Irine Seal, MD;  Location[facility];  Service: Urology;  Laterality: N/A;  . TRANSURETHRAL RESECTION OF BLADDER TUMOR N/A 10/31/2015   Procedure: RE-STAGINGG TRANSURETHRAL RESECTION OF BLADDER TUMOR (TURBT);  Surgeon: Irine Seal, MD;  Location: [facility];  Service: Urology;  Laterality: N/A;  . TRANSURETHRAL RESECTION OF BLADDER TUMOR N/A 08/17/2016   Procedure: TRANSURETHRAL RESECTION OF BLADDER TUMOR (TURBT);  Surgeon: Cleon Gustin, MD;  [facility];  Service: Urology;  Laterality: N/A;    There were no vitals filed for this visit.  Subjective Assessment - 06/13/19 1434    Subjective   S: It's frustrating because I  want to be able to do things like fasten my bra or sleep in a certain position    Pertinent History  Patient is a 44 y/o female S/P left shoulder RTC repair which was completed on 04/20/19. Dr. Aline Brochure has referred patient to occupational therapy for evaluation and treatment.    Currently in Pain?  Yes    Pain Score  5     Pain Location  Shoulder    Pain Orientation  Left    Pain Descriptors / Indicators  Aching    Pain Type  Surgical pain         [facility] OT Assessment - 06/13/19 0001      Assessment   Medical Diagnosis  Left shoulder RTC repair    Referring Provider (OT)  Arther Abbott, MD      Precautions   Precautions  Shoulder    Type of Shoulder Precautions  Sling on 4-6 weeks. Week 0-6 (4/8-5/20) P/ROM,  Week 6-12 (5/20-6/1) AA/ROM progressing to A/ROM. Week 12+: strengthening               OT Treatments/Exercises (OP) - 06/13/19 1442      Exercises   Exercises  Shoulder      Shoulder Exercises: Supine   Protraction  PROM;5 reps;AAROM;10 reps    Horizontal ABduction  PROM;5 reps;AAROM;10 reps    External Rotation  PROM;5 reps;AAROM;10 reps    Internal Rotation  PROM;5 reps;AAROM;10 reps    Flexion  PROM;5 reps;AAROM;10 reps    ABduction  PROM;5 reps;AAROM;10 reps      Shoulder Exercises: Seated   Extension  AAROM;10 reps   dowel   Row  AAROM;10 reps   dowel   Horizontal ABduction  AAROM;10 reps   dowel   External Rotation  AAROM;10 reps   dowel   Internal Rotation  AAROM;10 reps   dowel   Flexion  AAROM;10 reps   dowel   Abduction  AAROM;10 reps   dowel     Shoulder Exercises: Standing   Protraction  AROM;10 reps   hold 5 seconds   Row  AROM;10 reps   hold 5 seconds   Retraction  AROM;10 reps   hold 5 seconds     Shoulder Exercises: ROM/Strengthening   UBE (Upper Arm Bike)  1' forward; 1' backwards   pace 5   Wall Wash  10x flexion; 10x abduction    Thumb Tacks  1' chest level   with 1# wrist weight   Proximal Shoulder Strengthening, Supine  1' each      Functional Reaching Activities   Low Level  6 cones flexion; abduction      Manual Therapy   Manual Therapy  Myofascial release    Manual therapy comments  Manual therapy completed prior to exercises.     Myofascial Release  Myofascial release and manual stretching completed to left UE to decrease fascial restrictions and increase joint mobility in a pain free zone.              OT Education - 06/13/19 1657    Education Details  Provided handout with AAROM exercises using dowel rod to add to HEP       OT Short Term Goals - 05/11/19 1316      OT SHORT  TERM GOAL #1   Title  Patient will be educated and independent with her HEP in order to faciliate her progress in therapy and allow her to begin to use her LUE for daily tasks.  Time  6    Period  Weeks    Status  On-going    Target Date  06/15/19      OT SHORT TERM GOAL #2   Title  Patient will increaser her LUE P/ROM to [facility] in order to increase ability to complete dressing tasks independently.    Time  6    Period  Weeks    Status  On-going      OT SHORT TERM GOAL #3   Title  Patient will increase LUE strength to 3+/5 in order to complete waist level tasks without difficulty.    Time  6    Period  Weeks    Status  On-going      OT SHORT TERM GOAL #4   Title  Patient will report a decrease in pain of approximately 6/10 or less when completing daily tasks.    Time  6    Period  Weeks    Status  On-going        OT Long Term Goals - 05/11/19 1316      OT LONG TERM GOAL #1   Title  Patient will decrease her left UE fascial restrictions to trace amount in order to increase functional mobility needed to complete reaching tasks above shoulder level.    Time  12    Period  Weeks    Status  On-going      OT LONG TERM GOAL #2   Title  Patient will increase her LUE A/ROM to [facility] in order to complete household chores and yarwdwork tasks while using her LUE.    Time  12    Period  Weeks    Status  On-going      OT LONG TERM GOAL #3   Title  Patient will increase her LUE strength to 4+/5 in order to return to walking her dog with increased shoulder and scapular stability.    Time  12    Period  Weeks    Status  On-going      OT LONG TERM GOAL #4   Title  Patient will report a decrease in pain of approximately 3/10 or less in her LUE while completing daily tasks.    Time  12    Period  Weeks    Status  On-going            Plan - 06/13/19 1659    Clinical Impression Statement  A: Continued AAROM progressing to AROM per protocol. Continue myofascial relase to upper arm,  anterior shoulder, and trapezius. Pt demonstrating good technique with seated AAROM exercises using dowel rod. Provided handout to add to HEP. Providing verbal and tactile cues throughout    Body Structure / Function / Physical Skills  ADL;ROM;Fascial restriction;Strength;Pain;UE functional use;Mobility    Plan  P: continue AAROM and strengthing. Complete Mini reassessment       Patient will benefit from skilled therapeutic intervention in order to improve the following deficits and impairments:   Body Structure / Function / Physical Skills: ADL, ROM, Fascial restriction, Strength, Pain, UE functional use, Mobility       Visit Diagnosis: Other symptoms and signs involving the musculoskeletal system  Acute pain of left shoulder  Stiffness of left shoulder, not elsewhere classified    Problem List Patient Active Problem List   Diagnosis Date Noted  . Essential hypertension, benign 05/15/2019  . S/P left rotator cuff repair 04/20/19  04/27/2019  . Complete tear of left rotator cuff   .  Uncontrolled type 2 diabetes mellitus with hyperglycemia ([place]) 10/10/2018  . Mixed hyperlipidemia 10/10/2018  . Sacroiliac joint pain 02/23/2017  . Chronic low back pain 02/09/2017  . Somatic dysfunction of left sacroiliac joint 02/09/2017  . GAD (generalized anxiety disorder) 12/17/2016  . MDD (major depressive disorder), recurrent episode, moderate ([place]) 12/17/2016  . Attention deficit hyperactivity disorder (ADHD) 12/17/2016  . Postoperative anemia due to acute blood loss 11/04/2015  . Bladder cancer ([place]) 10/31/2015  . CARPAL TUNNEL SYNDROME 10/02/2009    Neal Dy, MSOT, OTR/L 06/13/2019, 5:02 PM  [place], [place], [address] Phone: [phone]   Fax:  [phone]  Name: Sandra [name] MRN: [id number] Date of Birth: November 18, 1975

## 2019-06-14 ENCOUNTER — Other Ambulatory Visit: Payer: Self-pay

## 2019-06-14 MED ORDER — HYDROCODONE-ACETAMINOPHEN 5-325 MG PO TABS: 1.0000 | ORAL_TABLET | Freq: Three times a day (TID) | ORAL | 0 refills | Status: DC | PRN

## 2019-06-15 ENCOUNTER — Encounter (HOSPITAL_COMMUNITY): Payer: Medicaid Other | Admitting: Occupational Therapy

## 2019-06-20 ENCOUNTER — Telehealth (HOSPITAL_COMMUNITY): Payer: Self-pay | Admitting: Occupational Therapy

## 2019-06-20 ENCOUNTER — Ambulatory Visit (HOSPITAL_COMMUNITY): Payer: Medicaid Other | Admitting: Occupational Therapy

## 2019-06-20 ENCOUNTER — Telehealth (HOSPITAL_COMMUNITY): Payer: Self-pay | Admitting: Orthopedic Surgery

## 2019-06-20 NOTE — Telephone Encounter (Signed)
Patient called to say: her daughter is in [facility] very sick and she can not get her this week. Please call or send a note to Dr Aline Brochure that she has not stopped PT. She is doing our exercises 3-4 txs a day while at the hospital.

## 2019-06-20 NOTE — Telephone Encounter (Signed)
Patient called to say: her daughter is in [facility] very sick and she can not get her this week. Please call or send a note to Dr Aline Brochure that she has not stopped PT. She is doing our exercises 3-4 txs a day while at the hospital.

## 2019-06-21 ENCOUNTER — Other Ambulatory Visit: Payer: Self-pay

## 2019-06-21 MED ORDER — HYDROCODONE-ACETAMINOPHEN 5-325 MG PO TABS: 1.0000 | ORAL_TABLET | Freq: Three times a day (TID) | ORAL | 0 refills | Status: DC | PRN

## 2019-06-22 ENCOUNTER — Encounter (HOSPITAL_COMMUNITY): Payer: Medicaid Other | Admitting: Occupational Therapy

## 2019-06-27 ENCOUNTER — Ambulatory Visit (HOSPITAL_COMMUNITY): Payer: Medicaid Other | Admitting: Occupational Therapy

## 2019-06-27 ENCOUNTER — Other Ambulatory Visit: Payer: Self-pay

## 2019-06-27 DIAGNOSIS — R29898 Other symptoms and signs involving the musculoskeletal system: Secondary | ICD-10-CM

## 2019-06-27 DIAGNOSIS — M25512 Pain in left shoulder: Secondary | ICD-10-CM

## 2019-06-27 DIAGNOSIS — M25612 Stiffness of left shoulder, not elsewhere classified: Secondary | ICD-10-CM

## 2019-06-27 NOTE — Therapy (Signed)
[place], [place], [address] Phone: [phone]   Fax:  [phone]  Occupational Therapy Treatment  Patient Details  Name: Sandra [name] MRN: [id number] Date of Birth: February 11, 1975 Referring Provider (OT): Arther Abbott, MD   Encounter Date: 06/27/2019   OT End of Session - 06/27/19 1456    Visit Number 6    Number of Visits 24    Date for OT Re-Evaluation 07/27/19    Authorization Type medicaid    Authorization Time Period medicaid auth OT 12 Visits 5/14-6/24/2021    Authorization - Visit Number 3    Authorization - Number of Visits 12    OT Start Time 1440    OT Stop Time 1521    OT Time Calculation (min) 41 min    Activity Tolerance Patient tolerated treatment well    Behavior During Therapy [facility] for tasks assessed/performed           Past Medical History:  Diagnosis Date  . ADHD (attention deficit hyperactivity disorder)   . Allergic rhinitis   . Anemia    hx of  . Bipolar 1 disorder ([place])   . Bladder tumor   . Cancer ([place])    bladder  . Dizziness   . Full dentures   . GERD (gastroesophageal reflux disease)   . Gross hematuria   . Hyperlipidemia   . Hypertension   . Type 2 diabetes mellitus ([place])   . Urgency of urination    dysuria, sui  . Wears glasses     Past Surgical History:  Procedure Laterality Date  . CYSTOSCOPY N/A 10/03/2015   Procedure: CYSTOSCOPY;  Surgeon: Irine Seal, MD;  Location: [facility];  Service: Urology;  Laterality: N/A;  . CYSTOSCOPY WITH STENT PLACEMENT Right 10/31/2015   Procedure: CYSTOSCOPY WITH ATTEMPTED RIGHT URETERAL OPENING;  Surgeon: Irine Seal, MD;  Location: [facility];  Service: Urology;  Laterality: Right;  . INTERSTIM IMPLANT PLACEMENT N/A 03/14/2018   Procedure: Barrie Lyme IMPLANT FIRST STAGE;  Surgeon: Cleon Gustin, MD;  [facility];  Service: Urology;  Laterality: N/A;  . INTERSTIM IMPLANT PLACEMENT N/A  03/30/2018   Procedure: Barrie Lyme IMPLANT SECOND STAGE;  Surgeon: Cleon Gustin, MD;  Location[facility];  Service: Urology;  Laterality: N/A;  . MULTIPLE TOOTH EXTRACTIONS  2015  . OVARIAN CYST REMOVAL Right 2004 approx  . SHOULDER OPEN ROTATOR CUFF REPAIR Left 04/20/2019   Procedure: ROTATOR CUFF REPAIR SHOULDER OPEN;  Surgeon: Carole Civil, MD;  Location: [facility];  Service: Orthopedics;  Laterality: Left;  . TONSILLECTOMY  12/30/2004  . TOTAL ABDOMINAL HYSTERECTOMY W/ BILATERAL SALPINGOOPHORECTOMY  05/16/2004  . TRANSURETHRAL RESECTION OF BLADDER TUMOR N/A 10/03/2015   Procedure: TRANSURETHRAL RESECTION OF BLADDER TUMOR (TURBT);  Surgeon: Irine Seal, MD;  Location: [facility];  Service: Urology;  Laterality: N/A;  . TRANSURETHRAL RESECTION OF BLADDER TUMOR N/A 10/31/2015   Procedure: RE-STAGINGG TRANSURETHRAL RESECTION OF BLADDER TUMOR (TURBT);  Surgeon: Irine Seal, MD;  Location: [facility];  Service: Urology;  Laterality: N/A;  . TRANSURETHRAL RESECTION OF BLADDER TUMOR N/A 08/17/2016   Procedure: TRANSURETHRAL RESECTION OF BLADDER TUMOR (TURBT);  Surgeon: Cleon Gustin, MD;  Location: [facility];  Service: Urology;  Laterality: N/A;    There were no vitals filed for this visit.   Subjective Assessment - 06/27/19 1441    Subjective  S: " My arm is just hurting so bad"  Pain Score 6     Pain Location Shoulder    Pain Orientation Left              [facility] OT Assessment - 06/27/19 0001      Assessment   Medical Diagnosis Left shoulder RTC repair    Referring Provider (OT) Arther Abbott, MD      Precautions   Precautions Shoulder    Type of Shoulder Precautions Sling on 4-6 weeks. Week 0-6 (4/8-5/20) P/ROM,  Week 6-12 (5/20-6/1) AA/ROM progressing to A/ROM. Week 12+: strengthening                    OT Treatments/Exercises (OP) - 06/27/19 1451      Exercises   Exercises Shoulder      Shoulder Exercises: Supine    Protraction PROM;5 reps;AAROM;10 reps    Horizontal ABduction PROM;5 reps;AAROM;10 reps    External Rotation PROM;5 reps;AAROM;10 reps    Internal Rotation PROM;5 reps;AAROM;10 reps    Flexion PROM;5 reps;AAROM;10 reps    ABduction PROM;5 reps;AAROM;10 reps      Shoulder Exercises: Therapy Ball   Flexion 10 reps    Other Therapy Ball Exercises Green ball for AAROM. Forward flexion 10x. chest press 10x. circles 10x (left and right).       Shoulder Exercises: ROM/Strengthening   UBE (Upper Arm Bike) 1' forward; 1' backwards   pace 8   Wall Wash 10x flexion; 10x abduction    Thumb Tacks 1' chest level    "W" Arms 10x    Proximal Shoulder Strengthening, Supine 1' each. Rest breaks between      Manual Therapy   Manual Therapy Myofascial release    Manual therapy comments Manual therapy completed prior to exercises.     Myofascial Release Myofascial release and manual stretching completed to left UE to decrease fascial restrictions and increase joint mobility in a pain free zone.                   OT Education - 06/27/19 1525    Education Details Re-printed dowel/ AAROM exercise handout and reviewed.    Person(s) Educated Patient    Methods Explanation;Demonstration;Verbal cues;Handout    Comprehension Returned demonstration;Verbalized understanding            OT Short Term Goals - 05/11/19 1316      OT SHORT TERM GOAL #1   Title Patient will be educated and independent with her HEP in order to faciliate her progress in therapy and allow her to begin to use her LUE for daily tasks.    Time 6    Period Weeks    Status On-going    Target Date 06/15/19      OT SHORT TERM GOAL #2   Title Patient will increaser her LUE P/ROM to [facility] in order to increase ability to complete dressing tasks independently.    Time 6    Period Weeks    Status On-going      OT SHORT TERM GOAL #3   Title Patient will increase LUE strength to 3+/5 in order to complete waist level tasks without  difficulty.    Time 6    Period Weeks    Status On-going      OT SHORT TERM GOAL #4   Title Patient will report a decrease in pain of approximately 6/10 or less when completing daily tasks.    Time 6    Period Weeks    Status On-going  OT Long Term Goals - 05/11/19 1316      OT LONG TERM GOAL #1   Title Patient will decrease her left UE fascial restrictions to trace amount in order to increase functional mobility needed to complete reaching tasks above shoulder level.    Time 12    Period Weeks    Status On-going      OT LONG TERM GOAL #2   Title Patient will increase her LUE A/ROM to [facility] in order to complete household chores and yarwdwork tasks while using her LUE.    Time 12    Period Weeks    Status On-going      OT LONG TERM GOAL #3   Title Patient will increase her LUE strength to 4+/5 in order to return to walking her dog with increased shoulder and scapular stability.    Time 12    Period Weeks    Status On-going      OT LONG TERM GOAL #4   Title Patient will report a decrease in pain of approximately 3/10 or less in her LUE while completing daily tasks.    Time 12    Period Weeks    Status On-going                 Plan - 06/27/19 1503    Clinical Impression Statement A: Continued AAROM and strengthening progressing towards AROM per protocal. Continued myofascial release and PROM. Pt demonstrating good technique. Performing dowel rod exercises, therapy ball, wall washing, and W exercises for AAROM and strengthing. Introduced UE bike and pt reporting she enjoyed the exercise.Providing cues throughout for form and technique.    Body Structure / Function / Physical Skills ADL;ROM;Fascial restriction;Strength;Pain;UE functional use;Mobility    Plan P: continue AAROM and strengthening.    Consulted and Agree with Plan of Care Patient           Patient will benefit from skilled therapeutic intervention in order to improve the following deficits  and impairments:   Body Structure / Function / Physical Skills: ADL, ROM, Fascial restriction, Strength, Pain, UE functional use, Mobility       Visit Diagnosis: Other symptoms and signs involving the musculoskeletal system  Acute pain of left shoulder  Stiffness of left shoulder, not elsewhere classified    Problem List Patient Active Problem List   Diagnosis Date Noted  . Essential hypertension, benign 05/15/2019  . S/P left rotator cuff repair 04/20/19  04/27/2019  . Complete tear of left rotator cuff   . Uncontrolled type 2 diabetes mellitus with hyperglycemia ([place]) 10/10/2018  . Mixed hyperlipidemia 10/10/2018  . Sacroiliac joint pain 02/23/2017  . Chronic low back pain 02/09/2017  . Somatic dysfunction of left sacroiliac joint 02/09/2017  . GAD (generalized anxiety disorder) 12/17/2016  . MDD (major depressive disorder), recurrent episode, moderate ([place]) 12/17/2016  . Attention deficit hyperactivity disorder (ADHD) 12/17/2016  . Postoperative anemia due to acute blood loss 11/04/2015  . Bladder cancer ([place]) 10/31/2015  . CARPAL TUNNEL SYNDROME 10/02/2009    Neal Dy, MSOT, OTR/L 06/27/2019, 5:25 PM  [place] [address] [place], [place], [address] Phone: [phone]   Fax:  [phone]  Name: CHELSEY REDONDO MRN: [id number] Date of Birth: [birth year]

## 2019-06-27 NOTE — Patient Instructions (Signed)

## 2019-06-28 ENCOUNTER — Other Ambulatory Visit: Payer: Self-pay

## 2019-06-28 MED ORDER — HYDROCODONE-ACETAMINOPHEN 5-325 MG PO TABS: 1.0000 | ORAL_TABLET | Freq: Three times a day (TID) | ORAL | 0 refills | Status: DC | PRN

## 2019-06-29 ENCOUNTER — Encounter (HOSPITAL_COMMUNITY): Payer: Medicaid Other | Admitting: Occupational Therapy

## 2019-07-04 ENCOUNTER — Ambulatory Visit (INDEPENDENT_AMBULATORY_CARE_PROVIDER_SITE_OTHER): Payer: Medicaid Other | Admitting: Orthopedic Surgery

## 2019-07-04 ENCOUNTER — Ambulatory Visit (HOSPITAL_COMMUNITY): Payer: Medicaid Other | Admitting: Occupational Therapy

## 2019-07-04 ENCOUNTER — Encounter: Payer: Self-pay | Admitting: Orthopedic Surgery

## 2019-07-04 ENCOUNTER — Other Ambulatory Visit: Payer: Self-pay

## 2019-07-04 ENCOUNTER — Encounter (HOSPITAL_COMMUNITY): Payer: Self-pay | Admitting: Occupational Therapy

## 2019-07-04 VITALS — Ht 61.0 in | Wt 156.0 lb

## 2019-07-04 DIAGNOSIS — M25512 Pain in left shoulder: Secondary | ICD-10-CM

## 2019-07-04 DIAGNOSIS — R29898 Other symptoms and signs involving the musculoskeletal system: Secondary | ICD-10-CM

## 2019-07-04 DIAGNOSIS — M25612 Stiffness of left shoulder, not elsewhere classified: Secondary | ICD-10-CM

## 2019-07-04 DIAGNOSIS — Z9889 Other specified postprocedural states: Secondary | ICD-10-CM

## 2019-07-04 MED ORDER — HYDROCODONE-ACETAMINOPHEN 5-325 MG PO TABS: 1.0000 | ORAL_TABLET | Freq: Three times a day (TID) | ORAL | 0 refills | Status: DC | PRN

## 2019-07-04 MED ORDER — CYCLOBENZAPRINE HCL 10 MG PO TABS: 10.0000 mg | ORAL_TABLET | Freq: Three times a day (TID) | ORAL | 1 refills | Status: DC

## 2019-07-04 NOTE — Therapy (Signed)
[place], [place], [address] Phone: [phone]   Fax:  [phone]  Occupational Therapy Treatment  Patient Details  Name: Sandra [name] MRN: [id number] Date of Birth: July 16, 1975 Referring Provider (OT): Arther Abbott, MD   Encounter Date: 07/04/2019   OT End of Session - 07/04/19 1725    Visit Number 7    Number of Visits 24    Date for OT Re-Evaluation 07/27/19    Authorization Type medicaid    Authorization Time Period medicaid auth OT 12 Visits 5/14-6/24/2021    Authorization - Visit Number 4    Authorization - Number of Visits 12    OT Start Time 9563    OT Stop Time 1515    OT Time Calculation (min) 41 min    Activity Tolerance Patient tolerated treatment well    Behavior During Therapy [facility] for tasks assessed/performed           Past Medical History:  Diagnosis Date  . ADHD (attention deficit hyperactivity disorder)   . Allergic rhinitis   . Anemia    hx of  . Bipolar 1 disorder ([place])   . Bladder tumor   . Cancer ([place])    bladder  . Dizziness   . Full dentures   . GERD (gastroesophageal reflux disease)   . Gross hematuria   . Hyperlipidemia   . Hypertension   . Type 2 diabetes mellitus ([place])   . Urgency of urination    dysuria, sui  . Wears glasses     Past Surgical History:  Procedure Laterality Date  . CYSTOSCOPY N/A 10/03/2015   Procedure: CYSTOSCOPY;  Surgeon: Irine Seal, MD;  Location: [facility];  Service: Urology;  Laterality: N/A;  . CYSTOSCOPY WITH STENT PLACEMENT Right 10/31/2015   Procedure: CYSTOSCOPY WITH ATTEMPTED RIGHT URETERAL OPENING;  Surgeon: Irine Seal, MD;  Location: [facility];  Service: Urology;  Laterality: Right;  . INTERSTIM IMPLANT PLACEMENT N/A 03/14/2018   Procedure: Barrie Lyme IMPLANT FIRST STAGE;  Surgeon: Cleon Gustin, MD;  Location: [facility];  Service: Urology;  Laterality: N/A;  . INTERSTIM IMPLANT PLACEMENT N/A  03/30/2018   Procedure: Barrie Lyme IMPLANT SECOND STAGE;  Surgeon: Cleon Gustin, MD;  Location: [facility];  Service: Urology;  Laterality: N/A;  . MULTIPLE TOOTH EXTRACTIONS  2015  . OVARIAN CYST REMOVAL Right 2004 approx  . SHOULDER OPEN ROTATOR CUFF REPAIR Left 04/20/2019   Procedure: ROTATOR CUFF REPAIR SHOULDER OPEN;  Surgeon: Carole Civil, MD;  Location: [facility];  Service: Orthopedics;  Laterality: Left;  . TONSILLECTOMY  12/30/2004  . TOTAL ABDOMINAL HYSTERECTOMY W/ BILATERAL SALPINGOOPHORECTOMY  05/16/2004  . TRANSURETHRAL RESECTION OF BLADDER TUMOR N/A 10/03/2015   Procedure: TRANSURETHRAL RESECTION OF BLADDER TUMOR (TURBT);  Surgeon: Irine Seal, MD;  Location: [facility];  Service: Urology;  Laterality: N/A;  . TRANSURETHRAL RESECTION OF BLADDER TUMOR N/A 10/31/2015   Procedure: RE-STAGINGG TRANSURETHRAL RESECTION OF BLADDER TUMOR (TURBT);  Surgeon: Irine Seal, MD;  Location: [facility];  Service: Urology;  Laterality: N/A;  . TRANSURETHRAL RESECTION OF BLADDER TUMOR N/A 08/17/2016   Procedure: TRANSURETHRAL RESECTION OF BLADDER TUMOR (TURBT);  Surgeon: Cleon Gustin, MD;  Location: [facility];  Service: Urology;  Laterality: N/A;    There were no vitals filed for this visit.   Subjective Assessment - 07/04/19 1436    Subjective  S: " I am really hurting today. This rain is likking me."  Pain Score 7     Pain Location Shoulder    Pain Orientation Left    Pain Descriptors / Indicators Shooting              [facility] OT Assessment - 07/04/19 1439      Assessment   Medical Diagnosis Left shoulder RTC repair    Referring Provider (OT) Arther Abbott, MD      Precautions   Precautions Shoulder    Type of Shoulder Precautions Sling on 4-6 weeks. Week 0-6 (4/8-5/20) P/ROM,  Week 6-12 (5/20-6/1) AA/ROM progressing to A/ROM. Week 12+: strengthening                    OT Treatments/Exercises (OP) - 07/04/19 1450      Exercises     Exercises Shoulder      Shoulder Exercises: Supine   Horizontal ABduction PROM;5 reps    Flexion PROM;5 reps    ABduction PROM;5 reps      Shoulder Exercises: Seated   Row AAROM;10 reps   dowel   Horizontal ABduction AAROM;10 reps   dowel   External Rotation AAROM;10 reps   dowel   Internal Rotation AAROM;10 reps   dowel   Flexion AAROM;10 reps   dowel   Abduction AAROM;10 reps   dowel     Shoulder Exercises: ROM/Strengthening   UBE (Upper Arm Bike) 1' forward; 1' backwards   pace 5-6   Wall Wash 10x flexion; 10x abduction    Thumb Tacks 1' chest level    "W" Arms 10x    Ball on Wall 1' flexion with wash clothe; 1' abduction with wash clothe      Shoulder Exercises: Stretch   Internal Rotation Stretch 5 reps   Hold 5 sec   Internal Rotation Stretch Limitations Using towel      Functional Reaching Activities   Low Level 5 cones flexion; abduction      Manual Therapy   Manual Therapy Myofascial release    Manual therapy comments Manual therapy completed prior to exercises.     Myofascial Release Myofascial release and manual stretching completed to left UE to decrease fascial restrictions and increase joint mobility in a pain free zone.                     OT Short Term Goals - 05/11/19 1316      OT SHORT TERM GOAL #1   Title Patient will be educated and independent with her HEP in order to faciliate her progress in therapy and allow her to begin to use her LUE for daily tasks.    Time 6    Period Weeks    Status On-going    Target Date 06/15/19      OT SHORT TERM GOAL #2   Title Patient will increaser her LUE P/ROM to [facility] in order to increase ability to complete dressing tasks independently.    Time 6    Period Weeks    Status On-going      OT SHORT TERM GOAL #3   Title Patient will increase LUE strength to 3+/5 in order to complete waist level tasks without difficulty.    Time 6    Period Weeks    Status On-going      OT SHORT TERM GOAL #4    Title Patient will report a decrease in pain of approximately 6/10 or less when completing daily tasks.    Time 6    Period Weeks  Status On-going             OT Long Term Goals - 05/11/19 1316      OT LONG TERM GOAL #1   Title Patient will decrease her left UE fascial restrictions to trace amount in order to increase functional mobility needed to complete reaching tasks above shoulder level.    Time 12    Period Weeks    Status On-going      OT LONG TERM GOAL #2   Title Patient will increase her LUE A/ROM to [facility] in order to complete household chores and yarwdwork tasks while using her LUE.    Time 12    Period Weeks    Status On-going      OT LONG TERM GOAL #3   Title Patient will increase her LUE strength to 4+/5 in order to return to walking her dog with increased shoulder and scapular stability.    Time 12    Period Weeks    Status On-going      OT LONG TERM GOAL #4   Title Patient will report a decrease in pain of approximately 3/10 or less in her LUE while completing daily tasks.    Time 12    Period Weeks    Status On-going                 Plan - 07/04/19 1725    Clinical Impression Statement A: Continued AAROM and strengthening progressing towards AROM per protocal. Continued myofascial release and PROM. Pt demonstrating good technique. Performing dowel rod exercises, wall washing, "wall circles with washclothe", thumb tacks, functional reach, UE bike, and W exercises for AAROM, AROM, and strengthening. Introduced internal/external rotation stretch with towel. .Providing cues throughout for form and technique.    Body Structure / Function / Physical Skills ADL;ROM;Fascial restriction;Strength;Pain;UE functional use;Mobility    Plan P: continue AAROM, AROM, and strengthening.    OT Home Exercise Plan eval: table slides, A/ROM wrist and elbow    Consulted and Agree with Plan of Care Patient           Patient will benefit from skilled therapeutic  intervention in order to improve the following deficits and impairments:   Body Structure / Function / Physical Skills: ADL, ROM, Fascial restriction, Strength, Pain, UE functional use, Mobility       Visit Diagnosis: Stiffness of left shoulder, not elsewhere classified  Acute pain of left shoulder  Other symptoms and signs involving the musculoskeletal system    Problem List Patient Active Problem List   Diagnosis Date Noted  . Essential hypertension, benign 05/15/2019  . S/P left rotator cuff repair 04/20/19  04/27/2019  . Complete tear of left rotator cuff   . Uncontrolled type 2 diabetes mellitus with hyperglycemia ([place]) 10/10/2018  . Mixed hyperlipidemia 10/10/2018  . Sacroiliac joint pain 02/23/2017  . Chronic low back pain 02/09/2017  . Somatic dysfunction of left sacroiliac joint 02/09/2017  . GAD (generalized anxiety disorder) 12/17/2016  . MDD (major depressive disorder), recurrent episode, moderate ([place]) 12/17/2016  . Attention deficit hyperactivity disorder (ADHD) 12/17/2016  . Postoperative anemia due to acute blood loss 11/04/2015  . Bladder cancer ([place]) 10/31/2015  . CARPAL TUNNEL SYNDROME 10/02/2009    Heywood Footman Christella App 07/04/2019, 5:29 PM  [facility] [address] [place], [place], [address] Phone: [phone]   Fax:  [phone]  Name: RUMAISA SCHNETZER MRN: [id number] Date of Birth: 1975/10/14

## 2019-07-04 NOTE — Progress Notes (Signed)
Chief Complaint  Patient presents with  . Post-op Follow-up    04/20/19 left rotator cuff repair     Encounter Diagnosis  Name Primary?  . S/P left rotator cuff repair Yes    Meds ordered this encounter  Medications  . cyclobenzaprine (FLEXERIL) 10 MG tablet    Sig: Take 1 tablet (10 mg total) by mouth 3 (three) times daily.    Dispense:  40 tablet    Refill:  1  . HYDROcodone-acetaminophen (NORCO/VICODIN) 5-325 MG tablet    Sig: Take 1 tablet by mouth every 8 (eight) hours as needed for up to 7 days for moderate pain.    Dispense:  21 tablet    Refill:  0   rom 115 active flexion  Moderate pain, has some opioid tolerance   Continue rehab   F/U 2 months

## 2019-07-06 ENCOUNTER — Encounter (HOSPITAL_COMMUNITY): Payer: Medicaid Other | Admitting: Occupational Therapy

## 2019-07-11 ENCOUNTER — Other Ambulatory Visit: Payer: Self-pay

## 2019-07-11 ENCOUNTER — Encounter (HOSPITAL_COMMUNITY): Payer: Self-pay | Admitting: Occupational Therapy

## 2019-07-11 ENCOUNTER — Ambulatory Visit (HOSPITAL_COMMUNITY): Payer: Medicaid Other | Admitting: Occupational Therapy

## 2019-07-11 DIAGNOSIS — M25512 Pain in left shoulder: Secondary | ICD-10-CM

## 2019-07-11 DIAGNOSIS — M25612 Stiffness of left shoulder, not elsewhere classified: Secondary | ICD-10-CM

## 2019-07-11 DIAGNOSIS — R29898 Other symptoms and signs involving the musculoskeletal system: Secondary | ICD-10-CM

## 2019-07-11 MED ORDER — HYDROCODONE-ACETAMINOPHEN 5-325 MG PO TABS: 1.0000 | ORAL_TABLET | Freq: Three times a day (TID) | ORAL | 0 refills | Status: DC | PRN

## 2019-07-11 NOTE — Therapy (Signed)
[place], [place], [address] Phone: [phone]   Fax:  [phone]  Occupational Therapy Reassessment & Treatment (recertification) Patient Details  Name: Sandra [name] MRN: [id number] Date of Birth: September 21, 1975 Referring Provider (OT): Arther Abbott, MD   Encounter Date: 07/11/2019   OT End of Session - 07/11/19 1508    Visit Number 8    Number of Visits 24    Date for OT Re-Evaluation 07/27/19    Authorization Type medicaid    Authorization Time Period medicaid auth OT 12 Visits 5/14-6/24/2021; Requesting 5 additional visits    Authorization - Visit Number 4    Authorization - Number of Visits 12    OT Start Time 1430    OT Stop Time 1504    OT Time Calculation (min) 34 min    Activity Tolerance Patient tolerated treatment well    Behavior During Therapy [facility] for tasks assessed/performed           Past Medical History:  Diagnosis Date  . ADHD (attention deficit hyperactivity disorder)   . Allergic rhinitis   . Anemia    hx of  . Bipolar 1 disorder ([place])   . Bladder tumor   . Cancer ([place])    bladder  . Dizziness   . Full dentures   . GERD (gastroesophageal reflux disease)   . Gross hematuria   . Hyperlipidemia   . Hypertension   . Type 2 diabetes mellitus ([place])   . Urgency of urination    dysuria, sui  . Wears glasses     Past Surgical History:  Procedure Laterality Date  . CYSTOSCOPY N/A 10/03/2015   Procedure: CYSTOSCOPY;  Surgeon: Irine Seal, MD;  Location: [facility];  Service: Urology;  Laterality: N/A;  . CYSTOSCOPY WITH STENT PLACEMENT Right 10/31/2015   Procedure: CYSTOSCOPY WITH ATTEMPTED RIGHT URETERAL OPENING;  Surgeon: Irine Seal, MD;  Location: [facility];  Service: Urology;  Laterality: Right;  . INTERSTIM IMPLANT PLACEMENT N/A 03/14/2018   Procedure: Barrie Lyme IMPLANT FIRST STAGE;  Surgeon: Cleon Gustin, MD;  Location: [facility];  Service:  Urology;  Laterality: N/A;  . INTERSTIM IMPLANT PLACEMENT N/A 03/30/2018   Procedure: Barrie Lyme IMPLANT SECOND STAGE;  Surgeon: Cleon Gustin, MD;  Location: [facility];  Service: Urology;  Laterality: N/A;  . MULTIPLE TOOTH EXTRACTIONS  2015  . OVARIAN CYST REMOVAL Right 2004 approx  . SHOULDER OPEN ROTATOR CUFF REPAIR Left 04/20/2019   Procedure: ROTATOR CUFF REPAIR SHOULDER OPEN;  Surgeon: Carole Civil, MD;  Location: [facility];  Service: Orthopedics;  Laterality: Left;  . TONSILLECTOMY  12/30/2004  . TOTAL ABDOMINAL HYSTERECTOMY W/ BILATERAL SALPINGOOPHORECTOMY  05/16/2004  . TRANSURETHRAL RESECTION OF BLADDER TUMOR N/A 10/03/2015   Procedure: TRANSURETHRAL RESECTION OF BLADDER TUMOR (TURBT);  Surgeon: Irine Seal, MD;  Location: [facility];  Service: Urology;  Laterality: N/A;  . TRANSURETHRAL RESECTION OF BLADDER TUMOR N/A 10/31/2015   Procedure: RE-STAGINGG TRANSURETHRAL RESECTION OF BLADDER TUMOR (TURBT);  Surgeon: Irine Seal, MD;  Location: [facility];  Service: Urology;  Laterality: N/A;  . TRANSURETHRAL RESECTION OF BLADDER TUMOR N/A 08/17/2016   Procedure: TRANSURETHRAL RESECTION OF BLADDER TUMOR (TURBT);  Surgeon: Cleon Gustin, MD;  Location: [facility];  Service: Urology;  Laterality: N/A;    There were no vitals filed for this visit.   Subjective Assessment - 07/11/19 1432    Subjective  S: My reaching has gotten a  little better.    Currently in Pain? Yes    Pain Score 6     Pain Location Shoulder    Pain Orientation Left    Pain Descriptors / Indicators Aching;Sore    Pain Type Acute pain    Pain Radiating Towards N/a    Pain Onset 1 to 4 weeks ago    Pain Frequency Constant    Aggravating Factors  nothing    Pain Relieving Factors pain medication, muscle relaxers, ice    Effect of Pain on Daily Activities mod effect on ADLs    Multiple Pain Sites No              [facility] OT Assessment - 07/11/19 1431      Assessment   Medical  Diagnosis Left shoulder RTC repair    Referring Provider (OT) Arther Abbott, MD      Precautions   Precautions Shoulder    Type of Shoulder Precautions progress as tolerated      Palpation   Palpation comment min-mod fascial restrictions in anterior shoulder and trapezius regions      AROM   Overall AROM Comments Assessed seated, er/IR adducted    AROM Assessment Site Shoulder    Right/Left Shoulder Left    Left Shoulder Flexion 150 Degrees   not previously assessed   Left Shoulder ABduction 96 Degrees   not previously assessed   Left Shoulder Internal Rotation 90 Degrees   not previously assessed   Left Shoulder External Rotation 32 Degrees   not previously assessed     PROM   Overall PROM Comments Assessed supine. IR/er adducted    PROM Assessment Site Shoulder    Right/Left Shoulder Left    Left Shoulder Flexion 155 Degrees   88 previous   Left Shoulder ABduction 180 Degrees   99 previous   Left Shoulder Internal Rotation 90 Degrees   same as previous   Left Shoulder External Rotation 35 Degrees   0 previous     Strength   Overall Strength Comments Assessed seated, er/IR adducted    Strength Assessment Site Shoulder    Right/Left Shoulder Left    Left Shoulder Flexion 4/5   not previously assessed   Left Shoulder ABduction 4/5   not previously assessed   Left Shoulder Internal Rotation 5/5   not previously assessed   Left Shoulder External Rotation 4+/5   not previously assessed                   OT Treatments/Exercises (OP) - 07/11/19 1433      Exercises   Exercises Shoulder      Shoulder Exercises: Supine   Protraction PROM;5 reps;AROM;10 reps    Horizontal ABduction PROM;5 reps;AROM;10 reps    External Rotation PROM;5 reps;AROM;10 reps    Internal Rotation PROM;5 reps;AROM;10 reps    Flexion PROM;5 reps;AROM;10 reps    ABduction PROM;5 reps;AROM;10 reps      Shoulder Exercises: Standing   Protraction AROM;10 reps    Horizontal ABduction AROM;10  reps    External Rotation AROM;10 reps    Internal Rotation AROM;10 reps    Flexion AROM;10 reps    ABduction AROM;10 reps    Extension Theraband;10 reps    Theraband Level (Shoulder Extension) Level 2 (Red)    Row Theraband;10 reps    Theraband Level (Shoulder Row) Level 2 (Red)    Retraction Theraband;10 reps    Theraband Level (Shoulder Retraction) Level 2 (Red)  Shoulder Exercises: ROM/Strengthening   X to V Arms 10X      Manual Therapy   Manual Therapy Myofascial release;Muscle Energy Technique    Manual therapy comments Manual therapy completed prior to exercises.     Myofascial Release Myofascial release and manual stretching completed to left UE to decrease fascial restrictions and increase joint mobility in a pain free zone.     Muscle Energy Technique muscle energy technique to left external rotators to decrease muscle spasms and improve ROM                  OT Education - 07/11/19 1455    Education Details A/ROM exercises    Person(s) Educated Patient    Methods Explanation;Demonstration;Verbal cues;Handout    Comprehension Returned demonstration;Verbalized understanding            OT Short Term Goals - 07/11/19 1510      OT SHORT TERM GOAL #1   Title Patient will be educated and independent with her HEP in order to faciliate her progress in therapy and allow her to begin to use her LUE for daily tasks.    Time 6    Period Weeks    Status On-going    Target Date 06/15/19      OT SHORT TERM GOAL #2   Title Patient will increaser her LUE P/ROM to [facility] in order to increase ability to complete dressing tasks independently.    Time 6    Period Weeks    Status Partially Met      OT SHORT TERM GOAL #3   Title Patient will increase LUE strength to 3+/5 in order to complete waist level tasks without difficulty.    Time 6    Period Weeks    Status Achieved      OT SHORT TERM GOAL #4   Title Patient will report a decrease in pain of approximately 6/10  or less when completing daily tasks.    Time 6    Period Weeks    Status Achieved             OT Long Term Goals - 07/11/19 1511      OT LONG TERM GOAL #1   Title Patient will decrease her left UE fascial restrictions to trace amount in order to increase functional mobility needed to complete reaching tasks above shoulder level.    Time 12    Period Weeks    Status On-going      OT LONG TERM GOAL #2   Title Patient will increase her LUE A/ROM to [facility] in order to complete household chores and yarwdwork tasks while using her LUE.    Time 12    Period Weeks    Status Partially Met      OT LONG TERM GOAL #3   Title Patient will increase her LUE strength to 4+/5 in order to return to walking her dog with increased shoulder and scapular stability.    Time 12    Period Weeks    Status On-going      OT LONG TERM GOAL #4   Title Patient will report a decrease in pain of approximately 3/10 or less in her LUE while completing daily tasks.    Time 12    Period Weeks    Status On-going                 Plan - 07/11/19 1447    Clinical Impression Statement A: Reassessment completed for  Medicaid reauthorization this session. Pt reports improvement in functional use of the LUE, continues to have difficulty with overhead  reaching and reaching behind back to fasten/unfasten bra. Pt has met 2 STGs and partially met 1 STG and 1 LTG. Pt with good improvements in ROM, er continues to be the most limited during passive stretching. Progressed to A/ROM for supine and standing today, pt demonstrating ROM WFL in all planes during tasks. Scapular theraband and x to v arms completed for stability. Verbal cuing for form and technique.    Body Structure / Function / Physical Skills ADL;ROM;Fascial restriction;Strength;Pain;UE functional use;Mobility    Rehab Potential Excellent    Clinical Decision Making Limited treatment options, no task modification necessary    Comorbidities Affecting  Occupational Performance: None    Modification or Assistance to Complete Evaluation  No modification of tasks or assist necessary to complete eval    OT Frequency 2x / week    OT Duration Other (comment)   3 weeks   OT Treatment/Interventions Self-care/ADL training;Therapeutic exercise;Manual Therapy;Neuromuscular education;Ultrasound;Therapeutic activities;Patient/family education;Passive range of motion;Moist Heat;Electrical Stimulation;Cryotherapy;DME and/or AE instruction    Plan P: Follow up on HEP, continue with A/ROM and scapular theraband, add overhead lacing, resume ball on wall    OT Home Exercise Plan eval: table slides, A/ROM wrist and elbow; 6/29: A/ROM    Consulted and Agree with Plan of Care Patient           Patient will benefit from skilled therapeutic intervention in order to improve the following deficits and impairments:   Body Structure / Function / Physical Skills: ADL, ROM, Fascial restriction, Strength, Pain, UE functional use, Mobility       Visit Diagnosis: Stiffness of left shoulder, not elsewhere classified  Acute pain of left shoulder  Other symptoms and signs involving the musculoskeletal system    Problem List Patient Active Problem List   Diagnosis Date Noted  . Essential hypertension, benign 05/15/2019  . S/P left rotator cuff repair 04/20/19  04/27/2019  . Complete tear of left rotator cuff   . Uncontrolled type 2 diabetes mellitus with hyperglycemia ([place]) 10/10/2018  . Mixed hyperlipidemia 10/10/2018  . Sacroiliac joint pain 02/23/2017  . Chronic low back pain 02/09/2017  . Somatic dysfunction of left sacroiliac joint 02/09/2017  . GAD (generalized anxiety disorder) 12/17/2016  . MDD (major depressive disorder), recurrent episode, moderate ([place]) 12/17/2016  . Attention deficit hyperactivity disorder (ADHD) 12/17/2016  . Postoperative anemia due to acute blood loss 11/04/2015  . Bladder cancer ([place]) 10/31/2015  . CARPAL TUNNEL SYNDROME  10/02/2009   Guadelupe Sabin, OTR/L  [phone] 07/11/2019, 3:15 PM  [facility] [address] [place], [place], [address] Phone: [phone]   Fax:  [phone]  Name: Sandra [name] MRN: [id number] Date of Birth: [birth year]

## 2019-07-11 NOTE — Patient Instructions (Signed)

## 2019-07-13 ENCOUNTER — Ambulatory Visit (HOSPITAL_COMMUNITY): Payer: Medicaid Other | Admitting: Occupational Therapy

## 2019-07-14 ENCOUNTER — Ambulatory Visit (HOSPITAL_COMMUNITY): Payer: Medicaid Other | Attending: Orthopedic Surgery | Admitting: Occupational Therapy

## 2019-07-14 ENCOUNTER — Encounter (HOSPITAL_COMMUNITY): Payer: Self-pay | Admitting: Occupational Therapy

## 2019-07-14 ENCOUNTER — Other Ambulatory Visit: Payer: Self-pay

## 2019-07-14 DIAGNOSIS — M25512 Pain in left shoulder: Secondary | ICD-10-CM | POA: Diagnosis present

## 2019-07-14 DIAGNOSIS — R29898 Other symptoms and signs involving the musculoskeletal system: Secondary | ICD-10-CM

## 2019-07-14 DIAGNOSIS — M25612 Stiffness of left shoulder, not elsewhere classified: Secondary | ICD-10-CM | POA: Diagnosis present

## 2019-07-14 NOTE — Patient Instructions (Signed)

## 2019-07-14 NOTE — Therapy (Signed)
[place], [place], [address] Phone: [phone]   Fax:  [phone]  Occupational Therapy Treatment  Patient Details  Name: SAGA BALTHAZAR MRN: [id number] Date of Birth: 28-May-1975 Referring Provider (OT): Arther Abbott, MD   Encounter Date: 07/14/2019   OT End of Session - 07/14/19 1341    Visit Number 9    Number of Visits 24    Date for OT Re-Evaluation 07/27/19    Authorization Type medicaid    Authorization Time Period medicaid auth OT 12 Visits 5/14-6/24/2021; Requesting 5 additional visits    Authorization - Visit Number 5    Authorization - Number of Visits 12    OT Start Time 1302    OT Stop Time 1340    OT Time Calculation (min) 38 min    Activity Tolerance Patient tolerated treatment well    Behavior During Therapy [facility] for tasks assessed/performed           Past Medical History:  Diagnosis Date  . ADHD (attention deficit hyperactivity disorder)   . Allergic rhinitis   . Anemia    hx of  . Bipolar 1 disorder ([place])   . Bladder tumor   . Cancer ([place])    bladder  . Dizziness   . Full dentures   . GERD (gastroesophageal reflux disease)   . Gross hematuria   . Hyperlipidemia   . Hypertension   . Type 2 diabetes mellitus ([place])   . Urgency of urination    dysuria, sui  . Wears glasses     Past Surgical History:  Procedure Laterality Date  . CYSTOSCOPY N/A 10/03/2015   Procedure: CYSTOSCOPY;  Surgeon: Irine Seal, MD;  Location: [facility];  Service: Urology;  Laterality: N/A;  . CYSTOSCOPY WITH STENT PLACEMENT Right 10/31/2015   Procedure: CYSTOSCOPY WITH ATTEMPTED RIGHT URETERAL OPENING;  Surgeon: Irine Seal, MD;  Location: [facility];  Service: Urology;  Laterality: Right;  . INTERSTIM IMPLANT PLACEMENT N/A 03/14/2018   Procedure: Barrie Lyme IMPLANT FIRST STAGE;  Surgeon: Cleon Gustin, MD;  Location: [facility];  Service: Urology;  Laterality: N/A;  .  INTERSTIM IMPLANT PLACEMENT N/A 03/30/2018   Procedure: Barrie Lyme IMPLANT SECOND STAGE;  Surgeon: Cleon Gustin, MD;  Location: [facility];  Service: Urology;  Laterality: N/A;  . MULTIPLE TOOTH EXTRACTIONS  2015  . OVARIAN CYST REMOVAL Right 2004 approx  . SHOULDER OPEN ROTATOR CUFF REPAIR Left 04/20/2019   Procedure: ROTATOR CUFF REPAIR SHOULDER OPEN;  Surgeon: Carole Civil, MD;  Location: [facility];  Service: Orthopedics;  Laterality: Left;  . TONSILLECTOMY  12/30/2004  . TOTAL ABDOMINAL HYSTERECTOMY W/ BILATERAL SALPINGOOPHORECTOMY  05/16/2004  . TRANSURETHRAL RESECTION OF BLADDER TUMOR N/A 10/03/2015   Procedure: TRANSURETHRAL RESECTION OF BLADDER TUMOR (TURBT);  Surgeon: Irine Seal, MD;  Location: [facility];  Service: Urology;  Laterality: N/A;  . TRANSURETHRAL RESECTION OF BLADDER TUMOR N/A 10/31/2015   Procedure: RE-STAGINGG TRANSURETHRAL RESECTION OF BLADDER TUMOR (TURBT);  Surgeon: Irine Seal, MD;  Location: [facility];  Service: Urology;  Laterality: N/A;  . TRANSURETHRAL RESECTION OF BLADDER TUMOR N/A 08/17/2016   Procedure: TRANSURETHRAL RESECTION OF BLADDER TUMOR (TURBT);  Surgeon: Cleon Gustin, MD;  Location: [facility];  Service: Urology;  Laterality: N/A;    There were no vitals filed for this visit.   Subjective Assessment - 07/14/19 1304    Subjective  S: The rain last night really made it  hurt.    Currently in Pain? Yes    Pain Score 5     Pain Location Shoulder    Pain Orientation Left    Pain Descriptors / Indicators Aching;Sore    Pain Type Acute pain    Pain Radiating Towards N/A    Pain Onset 1 to 4 weeks ago    Pain Frequency Intermittent    Aggravating Factors  nothing    Pain Relieving Factors pain medication, muscle relaxers, heat    Effect of Pain on Daily Activities min/mod effect on ADLs    Multiple Pain Sites No              [facility] OT Assessment - 07/14/19 1303      Assessment   Medical Diagnosis Left  shoulder RTC repair      Precautions   Precautions Shoulder    Type of Shoulder Precautions progress as tolerated                    OT Treatments/Exercises (OP) - 07/14/19 1304      Exercises   Exercises Shoulder      Shoulder Exercises: Supine   Protraction PROM;5 reps;AROM;10 reps    Horizontal ABduction PROM;5 reps;AROM;10 reps    External Rotation PROM;5 reps;AROM;10 reps    Internal Rotation PROM;5 reps;AROM;10 reps    Flexion PROM;5 reps;AROM;10 reps    ABduction PROM;5 reps;AROM;10 reps      Shoulder Exercises: Standing   Protraction AROM;10 reps    Horizontal ABduction AROM;10 reps    External Rotation AROM;10 reps    Internal Rotation AROM;10 reps    Flexion AROM;10 reps    ABduction AROM;10 reps    Extension Theraband;10 reps    Theraband Level (Shoulder Extension) Level 2 (Red)    Row Theraband;10 reps    Theraband Level (Shoulder Row) Level 2 (Red)    Retraction Theraband;10 reps    Theraband Level (Shoulder Retraction) Level 2 (Red)      Shoulder Exercises: ROM/Strengthening   X to V Arms 10X    Proximal Shoulder Strengthening, Supine 10X each, no rest breaks      Modalities   Modalities Electrical Stimulation      Electrical Stimulation   Electrical Stimulation Location left shoulder    Electrical Stimulation Action interferential    Electrical Stimulation Parameters 6.0CV    Electrical Stimulation Goals Pain      Manual Therapy   Manual Therapy Myofascial release    Manual therapy comments Manual therapy completed prior to exercises.     Myofascial Release Myofascial release and manual stretching completed to left UE to decrease fascial restrictions and increase joint mobility in a pain free zone.                   OT Education - 07/14/19 1329    Education Details scapular theraband    Person(s) Educated Patient    Methods Explanation;Demonstration;Handout;Verbal cues    Comprehension Returned demonstration;Verbalized  understanding            OT Short Term Goals - 07/11/19 1510      OT SHORT TERM GOAL #1   Title Patient will be educated and independent with her HEP in order to faciliate her progress in therapy and allow her to begin to use her LUE for daily tasks.    Time 6    Period Weeks    Status On-going    Target Date 06/15/19  OT SHORT TERM GOAL #2   Title Patient will increaser her LUE P/ROM to [facility] in order to increase ability to complete dressing tasks independently.    Time 6    Period Weeks    Status Partially Met      OT SHORT TERM GOAL #3   Title Patient will increase LUE strength to 3+/5 in order to complete waist level tasks without difficulty.    Time 6    Period Weeks    Status Achieved      OT SHORT TERM GOAL #4   Title Patient will report a decrease in pain of approximately 6/10 or less when completing daily tasks.    Time 6    Period Weeks    Status Achieved             OT Long Term Goals - 07/11/19 1511      OT LONG TERM GOAL #1   Title Patient will decrease her left UE fascial restrictions to trace amount in order to increase functional mobility needed to complete reaching tasks above shoulder level.    Time 12    Period Weeks    Status On-going      OT LONG TERM GOAL #2   Title Patient will increase her LUE A/ROM to [facility] in order to complete household chores and yarwdwork tasks while using her LUE.    Time 12    Period Weeks    Status Partially Met      OT LONG TERM GOAL #3   Title Patient will increase her LUE strength to 4+/5 in order to return to walking her dog with increased shoulder and scapular stability.    Time 12    Period Weeks    Status On-going      OT LONG TERM GOAL #4   Title Patient will report a decrease in pain of approximately 3/10 or less in her LUE while completing daily tasks.    Time 12    Period Weeks    Status On-going                 Plan - 07/14/19 1330    Clinical Impression Statement A: Pt reporting  increased pain with rain last night. Completed manual therapy noting deep fascial restrictions in anterior shoulder region. Continued with A/ROM and scapular theraband exercises. Pt with full ROM, reports discomfort in anterior shoulder during tasks. ES trialed at end of session for pain. Pt reporting minimal improvement in pain after ES.    Body Structure / Function / Physical Skills ADL;ROM;Fascial restriction;Strength;Pain;UE functional use;Mobility    Plan P: Follow up on HEP, continue with A/ROM and add overhead lacing; resume ball on wall    OT Home Exercise Plan eval: table slides, A/ROM wrist and elbow; 6/29: A/ROM; 7/2: scapular theraband-red    Consulted and Agree with Plan of Care Patient           Patient will benefit from skilled therapeutic intervention in order to improve the following deficits and impairments:   Body Structure / Function / Physical Skills: ADL, ROM, Fascial restriction, Strength, Pain, UE functional use, Mobility       Visit Diagnosis: Stiffness of left shoulder, not elsewhere classified  Acute pain of left shoulder  Other symptoms and signs involving the musculoskeletal system    Problem List Patient Active Problem List   Diagnosis Date Noted  . Essential hypertension, benign 05/15/2019  . S/P left rotator cuff repair 04/20/19  04/27/2019  .  Complete tear of left rotator cuff   . Uncontrolled type 2 diabetes mellitus with hyperglycemia ([place]) 10/10/2018  . Mixed hyperlipidemia 10/10/2018  . Sacroiliac joint pain 02/23/2017  . Chronic low back pain 02/09/2017  . Somatic dysfunction of left sacroiliac joint 02/09/2017  . GAD (generalized anxiety disorder) 12/17/2016  . MDD (major depressive disorder), recurrent episode, moderate ([place]) 12/17/2016  . Attention deficit hyperactivity disorder (ADHD) 12/17/2016  . Postoperative anemia due to acute blood loss 11/04/2015  . Bladder cancer ([place]) 10/31/2015  . CARPAL TUNNEL SYNDROME 10/02/2009    Guadelupe Sabin, OTR/L  [phone] 07/14/2019, 1:42 PM  [place] [address] [place], [place], [address] Phone: [phone]   Fax:  [phone]  Name: AVANTHIKA DEHNERT MRN: [id number] Date of Birth: Sep 24, 1975

## 2019-07-18 ENCOUNTER — Other Ambulatory Visit: Payer: Self-pay

## 2019-07-18 ENCOUNTER — Ambulatory Visit (HOSPITAL_COMMUNITY): Payer: Medicaid Other | Admitting: Occupational Therapy

## 2019-07-18 MED ORDER — HYDROCODONE-ACETAMINOPHEN 5-325 MG PO TABS: 1.0000 | ORAL_TABLET | Freq: Three times a day (TID) | ORAL | 0 refills | Status: DC | PRN

## 2019-07-19 ENCOUNTER — Other Ambulatory Visit: Payer: Self-pay

## 2019-07-19 ENCOUNTER — Encounter (HOSPITAL_COMMUNITY): Payer: Self-pay | Admitting: Occupational Therapy

## 2019-07-19 ENCOUNTER — Ambulatory Visit (HOSPITAL_COMMUNITY): Payer: Medicaid Other | Admitting: Occupational Therapy

## 2019-07-19 DIAGNOSIS — M25512 Pain in left shoulder: Secondary | ICD-10-CM

## 2019-07-19 DIAGNOSIS — M25612 Stiffness of left shoulder, not elsewhere classified: Secondary | ICD-10-CM

## 2019-07-19 DIAGNOSIS — R29898 Other symptoms and signs involving the musculoskeletal system: Secondary | ICD-10-CM

## 2019-07-19 NOTE — Patient Instructions (Signed)
   1) Towel Stretch with Internal Rotation   Or     Gently pull up (or to the side) your affected arm  behind your back with the assist of a towel. Hold 10 seconds, repeat 3-5 times. 1-2 times/day.     2) Ball pass:  Use a tennis ball and pass behind your back, using left arm to push the ball as far as possible before grabbing with the right hand. 10X, 1-2x/day   3) Reaching task:  Use plastic cups to reach into overhead cabinet while standing with feet flat on the floor. Place and remove 10X, 1-2x/day

## 2019-07-19 NOTE — Therapy (Signed)
[place], [place], [address] Phone: [phone]   Fax:  [phone]  Occupational Therapy Treatment  Patient Details  Name: Sandra [name] MRN: [id number] Date of Birth: 1975/11/08 Referring Provider (OT): Arther Abbott, MD   Encounter Date: 07/19/2019   OT End of Session - 07/19/19 1609    Visit Number 10    Number of Visits 24    Date for OT Re-Evaluation 07/27/19    Authorization Type medicaid    Authorization Time Period 6 visits approved 7/2-7/22    Authorization - Visit Number 2    Authorization - Number of Visits 6    OT Start Time 9798    OT Stop Time 1557    OT Time Calculation (min) 38 min    Activity Tolerance Patient tolerated treatment well    Behavior During Therapy [facility] for tasks assessed/performed           Past Medical History:  Diagnosis Date  . ADHD (attention deficit hyperactivity disorder)   . Allergic rhinitis   . Anemia    hx of  . Bipolar 1 disorder ([place])   . Bladder tumor   . Cancer ([place])    bladder  . Dizziness   . Full dentures   . GERD (gastroesophageal reflux disease)   . Gross hematuria   . Hyperlipidemia   . Hypertension   . Type 2 diabetes mellitus ([place])   . Urgency of urination    dysuria, sui  . Wears glasses     Past Surgical History:  Procedure Laterality Date  . CYSTOSCOPY N/A 10/03/2015   Procedure: CYSTOSCOPY;  Surgeon: Irine Seal, MD;  Location[facility]  Service: Urology;  Laterality: N/A;  . CYSTOSCOPY WITH STENT PLACEMENT Right 10/31/2015   Procedure: CYSTOSCOPY WITH ATTEMPTED RIGHT URETERAL OPENING;  Surgeon: Irine Seal, MD;  Location[facility];  Service: Urology;  Laterality: Right;  . INTERSTIM IMPLANT PLACEMENT N/A 03/14/2018   Procedure: Barrie Lyme IMPLANT FIRST STAGE;  Surgeon: Cleon Gustin, MD;  Location: [facility];  Service: Urology;  Laterality: N/A;  . INTERSTIM IMPLANT PLACEMENT N/A 03/30/2018    Procedure: Barrie Lyme IMPLANT SECOND STAGE;  Surgeon: Cleon Gustin, MD;  Location: [facility];  Service: Urology;  Laterality: N/A;  . MULTIPLE TOOTH EXTRACTIONS  2015  . OVARIAN CYST REMOVAL Right 2004 approx  . SHOULDER OPEN ROTATOR CUFF REPAIR Left 04/20/2019   Procedure: ROTATOR CUFF REPAIR SHOULDER OPEN;  Surgeon: Carole Civil, MD;  Location: [facility];  Service: Orthopedics;  Laterality: Left;  . TONSILLECTOMY  12/30/2004  . TOTAL ABDOMINAL HYSTERECTOMY W/ BILATERAL SALPINGOOPHORECTOMY  05/16/2004  . TRANSURETHRAL RESECTION OF BLADDER TUMOR N/A 10/03/2015   Procedure: TRANSURETHRAL RESECTION OF BLADDER TUMOR (TURBT);  Surgeon: Irine Seal, MD;  Location: [facility];  Service: Urology;  Laterality: N/A;  . TRANSURETHRAL RESECTION OF BLADDER TUMOR N/A 10/31/2015   Procedure: RE-STAGINGG TRANSURETHRAL RESECTION OF BLADDER TUMOR (TURBT);  Surgeon: Irine Seal, MD;  Location: [facility];  Service: Urology;  Laterality: N/A;  . TRANSURETHRAL RESECTION OF BLADDER TUMOR N/A 08/17/2016   Procedure: TRANSURETHRAL RESECTION OF BLADDER TUMOR (TURBT);  Surgeon: Cleon Gustin, MD;  Location: [facility];  Service: Urology;  Laterality: N/A;    There were no vitals filed for this visit.   Subjective Assessment - 07/19/19 1521    Subjective  S: I'm just really frustrated with this shoulder.    Currently in Pain?  Yes    Pain Score 6     Pain Location Shoulder    Pain Orientation Left    Pain Descriptors / Indicators Aching;Sore    Pain Type Acute pain    Pain Radiating Towards N/A    Pain Onset More than a month ago    Pain Frequency Intermittent    Aggravating Factors  rain    Pain Relieving Factors pain medication, muscle relaxers, heat    Effect of Pain on Daily Activities min/mod effect on ADLs    Multiple Pain Sites No              [facility] OT Assessment - 07/19/19 1521      Assessment   Medical Diagnosis Left shoulder RTC repair      Precautions    Precautions Shoulder    Type of Shoulder Precautions progress as tolerated                    OT Treatments/Exercises (OP) - 07/19/19 1522      Exercises   Exercises Shoulder      Shoulder Exercises: Supine   Protraction PROM;5 reps;AROM;12 reps    Horizontal ABduction PROM;5 reps;AROM;12 reps    External Rotation PROM;5 reps;AROM;12 reps    Internal Rotation PROM;5 reps;AROM;12 reps    Flexion PROM;5 reps;AROM;12 reps    ABduction PROM;5 reps;AROM;12 reps      Shoulder Exercises: Standing   Flexion AROM;10 reps    Extension Theraband;12 reps    Theraband Level (Shoulder Extension) Level 2 (Red)    Row Theraband;12 reps    Theraband Level (Shoulder Row) Level 2 (Red)    Retraction Theraband;12 reps    Theraband Level (Shoulder Retraction) Level 2 (Red)      Shoulder Exercises: ROM/Strengthening   UBE (Upper Arm Bike) Level 1, 4' reverse   Pace: 4.0   X to V Arms 10X    Ball on Wall 1' flexion, 1' abduction, green weighted ball    Other ROM/Strengthening Exercises ball pass behind back for IR, behind head for er-10X      Shoulder Exercises: Stretch   Internal Rotation Stretch 3 reps   15", horizontal towel     Functional Reaching Activities   High Level Pt placed 10 cones on overhead shelf in flexion, removed in abduction      Manual Therapy   Manual Therapy Myofascial release    Manual therapy comments Manual therapy completed prior to exercises.     Myofascial Release Myofascial release and manual stretching completed to left UE to decrease fascial restrictions and increase joint mobility in a pain free zone.                   OT Education - 07/19/19 1545    Education Details IR stretch and ROM practice, overhead reaching practice    Person(s) Educated Patient    Methods Explanation;Demonstration;Handout;Verbal cues    Comprehension Returned demonstration;Verbalized understanding            OT Short Term Goals - 07/11/19 1510      OT SHORT  TERM GOAL #1   Title Patient will be educated and independent with her HEP in order to faciliate her progress in therapy and allow her to begin to use her LUE for daily tasks.    Time 6    Period Weeks    Status On-going    Target Date 06/15/19      OT SHORT TERM GOAL #2  Title Patient will increaser her LUE P/ROM to [facility] in order to increase ability to complete dressing tasks independently.    Time 6    Period Weeks    Status Partially Met      OT SHORT TERM GOAL #3   Title Patient will increase LUE strength to 3+/5 in order to complete waist level tasks without difficulty.    Time 6    Period Weeks    Status Achieved      OT SHORT TERM GOAL #4   Title Patient will report a decrease in pain of approximately 6/10 or less when completing daily tasks.    Time 6    Period Weeks    Status Achieved             OT Long Term Goals - 07/11/19 1511      OT LONG TERM GOAL #1   Title Patient will decrease her left UE fascial restrictions to trace amount in order to increase functional mobility needed to complete reaching tasks above shoulder level.    Time 12    Period Weeks    Status On-going      OT LONG TERM GOAL #2   Title Patient will increase her LUE A/ROM to [facility] in order to complete household chores and yarwdwork tasks while using her LUE.    Time 12    Period Weeks    Status Partially Met      OT LONG TERM GOAL #3   Title Patient will increase her LUE strength to 4+/5 in order to return to walking her dog with increased shoulder and scapular stability.    Time 12    Period Weeks    Status On-going      OT LONG TERM GOAL #4   Title Patient will report a decrease in pain of approximately 3/10 or less in her LUE while completing daily tasks.    Time 12    Period Weeks    Status On-going                 Plan - 07/19/19 1610    Clinical Impression Statement A: Pt reports frustration due to inability to reach bra and due to consistent discomfort. Manual  therapy completed to address fascial restrictions, pt with max restrictions in trapezius and scapular regions today. Pt with ROM WNL with exception of IR behind back. Added towel stretch and ball pass to address. Pt with good overhead reaching. Added ball on wall for scapular stability. Verbal cuing for form, technique, and speed.    Body Structure / Function / Physical Skills ADL;ROM;Fascial restriction;Strength;Pain;UE functional use;Mobility    Plan P: Follow up on HEP, continue with A/ROM and add overhead lacing, IR/er work    OT Home Exercise Plan eval: table slides, A/ROM wrist and elbow; 6/29: A/ROM; 7/2: scapular theraband-red; 7/7: IR work, functional reaching    Consulted and Agree with Plan of Care Patient           Patient will benefit from skilled therapeutic intervention in order to improve the following deficits and impairments:   Body Structure / Function / Physical Skills: ADL, ROM, Fascial restriction, Strength, Pain, UE functional use, Mobility       Visit Diagnosis: Stiffness of left shoulder, not elsewhere classified  Acute pain of left shoulder  Other symptoms and signs involving the musculoskeletal system    Problem List Patient Active Problem List   Diagnosis Date Noted  . Essential hypertension, benign 05/15/2019  .  S/P left rotator cuff repair 04/20/19  04/27/2019  . Complete tear of left rotator cuff   . Uncontrolled type 2 diabetes mellitus with hyperglycemia ([place]) 10/10/2018  . Mixed hyperlipidemia 10/10/2018  . Sacroiliac joint pain 02/23/2017  . Chronic low back pain 02/09/2017  . Somatic dysfunction of left sacroiliac joint 02/09/2017  . GAD (generalized anxiety disorder) 12/17/2016  . MDD (major depressive disorder), recurrent episode, moderate ([place]) 12/17/2016  . Attention deficit hyperactivity disorder (ADHD) 12/17/2016  . Postoperative anemia due to acute blood loss 11/04/2015  . Bladder cancer ([place]) 10/31/2015  . CARPAL TUNNEL SYNDROME  10/02/2009   Guadelupe Sabin, OTR/L  [phone] 07/19/2019, 4:13 PM  [place] [address] [place], [place], [address] Phone: [phone]   Fax:  [phone]  Name: Sandra [name] MRN: [id number] Date of Birth: Sep 25, 1975

## 2019-07-20 ENCOUNTER — Encounter (HOSPITAL_COMMUNITY): Payer: Medicaid Other | Admitting: Occupational Therapy

## 2019-07-25 ENCOUNTER — Encounter (HOSPITAL_COMMUNITY): Payer: Self-pay | Admitting: Occupational Therapy

## 2019-07-25 ENCOUNTER — Other Ambulatory Visit: Payer: Self-pay

## 2019-07-25 ENCOUNTER — Ambulatory Visit (HOSPITAL_COMMUNITY): Payer: Medicaid Other | Admitting: Occupational Therapy

## 2019-07-25 DIAGNOSIS — M25612 Stiffness of left shoulder, not elsewhere classified: Secondary | ICD-10-CM

## 2019-07-25 DIAGNOSIS — M25512 Pain in left shoulder: Secondary | ICD-10-CM

## 2019-07-25 DIAGNOSIS — R29898 Other symptoms and signs involving the musculoskeletal system: Secondary | ICD-10-CM

## 2019-07-25 MED ORDER — HYDROCODONE-ACETAMINOPHEN 5-325 MG PO TABS: 1.0000 | ORAL_TABLET | Freq: Three times a day (TID) | ORAL | 0 refills | Status: DC | PRN

## 2019-07-25 NOTE — Therapy (Addendum)
[place], [place], [address] Phone: [phone]   Fax:  [phone]  Occupational Therapy Treatment  Patient Details  Name: Sandra [name] MRN: [id number] Date of Birth: Oct 11, 1975 Referring Provider (OT): Arther Abbott, MD   Encounter Date: 07/25/2019   OT End of Session - 07/25/19 1832    Visit Number 11    Number of Visits 24    Date for OT Re-Evaluation 08/08/19    Authorization Type medicaid    Authorization Time Period 6 visits approved 7/2-7/22    Authorization - Visit Number 3    Authorization - Number of Visits 6    OT Start Time 6333    OT Stop Time 1513    OT Time Calculation (min) 41 min    Activity Tolerance Patient tolerated treatment well    Behavior During Therapy [facility] for tasks assessed/performed           Past Medical History:  Diagnosis Date   ADHD (attention deficit hyperactivity disorder)    Allergic rhinitis    Anemia    hx of   Bipolar 1 disorder ([place])    Bladder tumor    Cancer ([place])    bladder   Dizziness    Full dentures    GERD (gastroesophageal reflux disease)    Gross hematuria    Hyperlipidemia    Hypertension    Type 2 diabetes mellitus ([place])    Urgency of urination    dysuria, sui   Wears glasses     Past Surgical History:  Procedure Laterality Date   CYSTOSCOPY N/A 10/03/2015   Procedure: CYSTOSCOPY;  Surgeon: Irine Seal, MD;  Location: [place];  Service: Urology;  Laterality: N/A;   CYSTOSCOPY WITH STENT PLACEMENT Right 10/31/2015   Procedure: CYSTOSCOPY WITH ATTEMPTED RIGHT URETERAL OPENING;  Surgeon: Irine Seal, MD;  Location: [facility];  Service: Urology;  Laterality: Right;   INTERSTIM IMPLANT PLACEMENT N/A 03/14/2018   Procedure: Barrie Lyme IMPLANT FIRST STAGE;  Surgeon: Cleon Gustin, MD;  [facility];  Service: Urology;  Laterality: N/A;   INTERSTIM IMPLANT PLACEMENT N/A 03/30/2018    Procedure: Barrie Lyme IMPLANT SECOND STAGE;  Surgeon: Cleon Gustin, MD;  Location: [facility];  Service: Urology;  Laterality: N/A;   MULTIPLE TOOTH EXTRACTIONS  2015   OVARIAN CYST REMOVAL Right 2004 approx   SHOULDER OPEN ROTATOR CUFF REPAIR Left 04/20/2019   Procedure: ROTATOR CUFF REPAIR SHOULDER OPEN;  Surgeon: Carole Civil, MD;  Location: [facility];  Service: Orthopedics;  Laterality: Left;   TONSILLECTOMY  12/30/2004   TOTAL ABDOMINAL HYSTERECTOMY W/ BILATERAL SALPINGOOPHORECTOMY  05/16/2004   TRANSURETHRAL RESECTION OF BLADDER TUMOR N/A 10/03/2015   Procedure: TRANSURETHRAL RESECTION OF BLADDER TUMOR (TURBT);  Surgeon: Irine Seal, MD;  Location: [facility];  Service: Urology;  Laterality: N/A;   TRANSURETHRAL RESECTION OF BLADDER TUMOR N/A 10/31/2015   Procedure: RE-STAGINGG TRANSURETHRAL RESECTION OF BLADDER TUMOR (TURBT);  Surgeon: Irine Seal, MD;  Location: [facility];  Service: Urology;  Laterality: N/A;   TRANSURETHRAL RESECTION OF BLADDER TUMOR N/A 08/17/2016   Procedure: TRANSURETHRAL RESECTION OF BLADDER TUMOR (TURBT);  Surgeon: Cleon Gustin, MD;  [facility];  Service: Urology;  Laterality: N/A;    There were no vitals filed for this visit.   Subjective Assessment - 07/25/19 1433    Subjective  S: This arm and I are just not getting along    Pertinent  History Patient is a 44 y/o female S/P left shoulder RTC repair which was completed on 04/20/19. Dr. Aline Brochure has referred patient to occupational therapy for evaluation and treatment.    Currently in Pain? Yes    Pain Score 5     Pain Location Shoulder    Pain Orientation Left              [facility] OT Assessment - 07/25/19 0001      Assessment   Medical Diagnosis Left shoulder RTC repair    Referring Provider (OT) Arther Abbott, MD      Precautions   Precautions Shoulder    Type of Shoulder Precautions progress as tolerated      AROM   Overall AROM Comments  Assessed seated, er/IR adducted    AROM Assessment Site Shoulder    Right/Left Shoulder Left    Left Shoulder Flexion 161 Degrees   150   Left Shoulder ABduction 102 Degrees   96   Left Shoulder Internal Rotation 90 Degrees   90   Left Shoulder External Rotation 72 Degrees   32     PROM   Overall PROM Comments Assessed supine. IR/er adducted    PROM Assessment Site Shoulder    Right/Left Shoulder Left    Left Shoulder Flexion 164 Degrees   155   Left Shoulder ABduction 155 Degrees   180   Left Shoulder Internal Rotation 90 Degrees   90   Left Shoulder External Rotation 84 Degrees   35     Strength   Overall Strength Comments Assessed seated, er/IR adducted    Strength Assessment Site Shoulder    Right/Left Shoulder Left    Left Shoulder Flexion 4+/5    Left Shoulder ABduction 4/5    Left Shoulder Internal Rotation 5/5    Left Shoulder External Rotation 4+/5                    OT Treatments/Exercises (OP) - 07/25/19 1437      Exercises   Exercises Shoulder      Shoulder Exercises: Supine   Horizontal ABduction PROM;5 reps    External Rotation PROM;5 reps    Internal Rotation PROM;5 reps    Flexion PROM;10 reps    ABduction PROM;5 reps      Shoulder Exercises: Seated   Row AAROM;10 reps   Dowel rod   Horizontal ABduction AAROM;10 reps    External Rotation AAROM;10 reps   Dowel rod   Internal Rotation AAROM;10 reps   Dowel rod   Flexion AAROM;10 reps   Dowel rod   Abduction AAROM;10 reps   Dowel rod     Shoulder Exercises: ROM/Strengthening   UBE (Upper Arm Bike) Level 1; forward 1'; reverse 1'; pace 5    Over Head Lace Lacing and unlacing; requiring ~5 minutes    Ball on Wall 2' flexion, 2' abduction, green therapy ball    Other ROM/Strengthening Exercises Rolling green therapy ball up and down wall; 10x flexion and 10x abduction      Manual Therapy   Manual Therapy Myofascial release    Manual therapy comments Manual therapy completed prior to  exercises.     Myofascial Release Myofascial release and manual stretching completed to left UE to decrease fascial restrictions and increase joint mobility in a pain free zone.                     OT Short Term Goals - 07/11/19 1510  OT SHORT TERM GOAL #1   Title Patient will be educated and independent with her HEP in order to faciliate her progress in therapy and allow her to begin to use her LUE for daily tasks.    Time 6    Period Weeks    Status On-going    Target Date 06/15/19      OT SHORT TERM GOAL #2   Title Patient will increaser her LUE P/ROM to [facility] in order to increase ability to complete dressing tasks independently.    Time 6    Period Weeks    Status Partially Met      OT SHORT TERM GOAL #3   Title Patient will increase LUE strength to 3+/5 in order to complete waist level tasks without difficulty.    Time 6    Period Weeks    Status Achieved      OT SHORT TERM GOAL #4   Title Patient will report a decrease in pain of approximately 6/10 or less when completing daily tasks.    Time 6    Period Weeks    Status Achieved             OT Long Term Goals - 07/11/19 1511      OT LONG TERM GOAL #1   Title Patient will decrease her left UE fascial restrictions to trace amount in order to increase functional mobility needed to complete reaching tasks above shoulder level.    Time 12    Period Weeks    Status On-going      OT LONG TERM GOAL #2   Title Patient will increase her LUE A/ROM to [facility] in order to complete household chores and yarwdwork tasks while using her LUE.    Time 12    Period Weeks    Status Partially Met      OT LONG TERM GOAL #3   Title Patient will increase her LUE strength to 4+/5 in order to return to walking her dog with increased shoulder and scapular stability.    Time 12    Period Weeks    Status On-going      OT LONG TERM GOAL #4   Title Patient will report a decrease in pain of approximately 3/10 or less in her LUE  while completing daily tasks.    Time 12    Period Weeks    Status On-going                 Plan - 07/25/19 1836    Clinical Impression Statement A: Manual therapy completed to address fascial restrictions, pt with mod restrictions in trapezius and scapular regions today. Continue proximal strengthening and functioanl reach through ball on wall, over head lacing, and UB bike. Verbal cuing for form, technique, and speed.    Body Structure / Function / Physical Skills ADL;ROM;Fascial restriction;Strength;Pain;UE functional use;Mobility    Plan P: Follow up on HEP, continue with A/ROM, overhead lacing, IR/er work    OT Home Exercise Plan eval: table slides, A/ROM wrist and elbow; 6/29: A/ROM; 7/2: scapular theraband-red; 7/7: IR work, functional reaching    Consulted and Agree with Plan of Care Patient           Patient will benefit from skilled therapeutic intervention in order to improve the following deficits and impairments:   Body Structure / Function / Physical Skills: ADL, ROM, Fascial restriction, Strength, Pain, UE functional use, Mobility       Visit Diagnosis: Stiffness of  left shoulder, not elsewhere classified  Acute pain of left shoulder  Other symptoms and signs involving the musculoskeletal system    Problem List Patient Active Problem List   Diagnosis Date Noted   Essential hypertension, benign 05/15/2019   S/P left rotator cuff repair 04/20/19  04/27/2019   Complete tear of left rotator cuff    Uncontrolled type 2 diabetes mellitus with hyperglycemia ([place]) 10/10/2018   Mixed hyperlipidemia 10/10/2018   Sacroiliac joint pain 02/23/2017   Chronic low back pain 02/09/2017   Somatic dysfunction of left sacroiliac joint 02/09/2017   GAD (generalized anxiety disorder) 12/17/2016   MDD (major depressive disorder), recurrent episode, moderate ([place]) 12/17/2016   Attention deficit hyperactivity disorder (ADHD) 12/17/2016   Postoperative anemia due  to acute blood loss 11/04/2015   Bladder cancer ([place]) 10/31/2015   CARPAL TUNNEL SYNDROME 10/02/2009    Neal Dy, MSOT, OTR/L 07/25/2019, 6:40 PM  [place] [address] [place], [place], [address] Phone: [phone]   Fax:  [phone]  Name: Sandra [name] MRN: [id number] Date of Birth: 1975/04/03

## 2019-07-25 NOTE — Addendum Note (Signed)
Addended by: Vangie Bicker on: 07/25/2019 09:35 PM   Modules accepted: Orders

## 2019-07-25 NOTE — Telephone Encounter (Signed)
Rx request 

## 2019-07-27 ENCOUNTER — Ambulatory Visit (HOSPITAL_COMMUNITY): Payer: Medicaid Other | Admitting: Occupational Therapy

## 2019-07-27 ENCOUNTER — Encounter (HOSPITAL_COMMUNITY): Payer: Self-pay | Admitting: Occupational Therapy

## 2019-07-27 ENCOUNTER — Other Ambulatory Visit: Payer: Self-pay

## 2019-07-27 DIAGNOSIS — M25612 Stiffness of left shoulder, not elsewhere classified: Secondary | ICD-10-CM | POA: Diagnosis not present

## 2019-07-27 DIAGNOSIS — R29898 Other symptoms and signs involving the musculoskeletal system: Secondary | ICD-10-CM

## 2019-07-27 DIAGNOSIS — M25512 Pain in left shoulder: Secondary | ICD-10-CM

## 2019-07-27 NOTE — Therapy (Signed)
[facility] [facility] [place], [place], [address] Phone: [phone]   Fax:  [phone]  Occupational Therapy Treatment  Patient Details  Name: Sandra [name] MRN: [id number] Date of Birth: 08-13-1975 Referring Provider (OT): Arther Abbott, MD   Encounter Date: 07/27/2019   OT End of Session - 07/27/19 1456    Visit Number 12    Number of Visits 24    Date for OT Re-Evaluation 08/08/19    Authorization Type medicaid    Authorization Time Period 6 visits approved 7/2-7/22    Authorization - Visit Number 4    Authorization - Number of Visits 6    OT Start Time 1834    OT Stop Time 1515    OT Time Calculation (min) 43 min    Activity Tolerance Patient tolerated treatment well    Behavior During Therapy [facility] for tasks assessed/performed           Past Medical History:  Diagnosis Date  . ADHD (attention deficit hyperactivity disorder)   . Allergic rhinitis   . Anemia    hx of  . Bipolar 1 disorder ([place])   . Bladder tumor   . Cancer ([place])    bladder  . Dizziness   . Full dentures   . GERD (gastroesophageal reflux disease)   . Gross hematuria   . Hyperlipidemia   . Hypertension   . Type 2 diabetes mellitus ([place])   . Urgency of urination    dysuria, sui  . Wears glasses     Past Surgical History:  Procedure Laterality Date  . CYSTOSCOPY N/A 10/03/2015   Procedure: CYSTOSCOPY;  Surgeon: Irine Seal, MD;  Location: [facility];  Service: Urology;  Laterality: N/A;  . CYSTOSCOPY WITH STENT PLACEMENT Right 10/31/2015   Procedure: CYSTOSCOPY WITH ATTEMPTED RIGHT URETERAL OPENING;  Surgeon: Irine Seal, MD;  Location: [facility];  Service: Urology;  Laterality: Right;  . INTERSTIM IMPLANT PLACEMENT N/A 03/14/2018   Procedure: Barrie Lyme IMPLANT FIRST STAGE;  Surgeon: Cleon Gustin, MD;  Location[facility];  Service: Urology;  Laterality: N/A;  . INTERSTIM IMPLANT PLACEMENT N/A 03/30/2018    Procedure: Barrie Lyme IMPLANT SECOND STAGE;  Surgeon: Cleon Gustin, MD;  Location: [facility];  Service: Urology;  Laterality: N/A;  . MULTIPLE TOOTH EXTRACTIONS  2015  . OVARIAN CYST REMOVAL Right 2004 approx  . SHOULDER OPEN ROTATOR CUFF REPAIR Left 04/20/2019   Procedure: ROTATOR CUFF REPAIR SHOULDER OPEN;  Surgeon: Carole Civil, MD;  Location: [facility];  Service: Orthopedics;  Laterality: Left;  . TONSILLECTOMY  12/30/2004  . TOTAL ABDOMINAL HYSTERECTOMY W/ BILATERAL SALPINGOOPHORECTOMY  05/16/2004  . TRANSURETHRAL RESECTION OF BLADDER TUMOR N/A 10/03/2015   Procedure: TRANSURETHRAL RESECTION OF BLADDER TUMOR (TURBT);  Surgeon: Irine Seal, MD;  Location: [facility];  Service: Urology;  Laterality: N/A;  . TRANSURETHRAL RESECTION OF BLADDER TUMOR N/A 10/31/2015   Procedure: RE-STAGINGG TRANSURETHRAL RESECTION OF BLADDER TUMOR (TURBT);  Surgeon: Irine Seal, MD;  Location[facility];  Service: Urology;  Laterality: N/A;  . TRANSURETHRAL RESECTION OF BLADDER TUMOR N/A 08/17/2016   Procedure: TRANSURETHRAL RESECTION OF BLADDER TUMOR (TURBT);  Surgeon: Cleon Gustin, MD;  Location[facility];  Service: Urology;  Laterality: N/A;    There were no vitals filed for this visit.   Subjective Assessment - 07/27/19 1437    Subjective  S: I had a situation with my dog last night. She had two seizures  last night. And during the second one I was holding her and I fell on my back."    Pertinent History Patient is a 44 y/o female S/P left shoulder RTC repair which was completed on 04/20/19. Dr. Aline Brochure has referred patient to occupational therapy for evaluation and treatment.    Currently in Pain? Yes    Pain Score 4     Pain Location Shoulder    Pain Orientation Left    Pain Descriptors / Indicators Aching              [facility] OT Assessment - 07/27/19 1438      Assessment   Medical Diagnosis Left shoulder RTC repair    Referring Provider (OT) Arther Abbott,  MD      Precautions   Precautions Shoulder    Type of Shoulder Precautions progress as tolerated                    OT Treatments/Exercises (OP) - 07/27/19 1438      Exercises   Exercises Shoulder      Shoulder Exercises: Supine   Horizontal ABduction PROM;5 reps    External Rotation PROM;5 reps    Internal Rotation PROM;5 reps    Flexion PROM;5 reps    ABduction PROM;5 reps      Shoulder Exercises: Seated   Row AAROM;10 reps    Horizontal ABduction AAROM;10 reps    External Rotation AAROM;10 reps    Internal Rotation AAROM;10 reps    Flexion AAROM;10 reps    Abduction AAROM;10 reps      Shoulder Exercises: Standing   External Rotation Strengthening;10 reps;Theraband    Theraband Level (Shoulder External Rotation) Level 2 (Red)    Internal Rotation Strengthening;10 reps;Theraband    Theraband Level (Shoulder Internal Rotation) Level 2 (Red)    Flexion Strengthening;10 reps;Theraband    Theraband Level (Shoulder Flexion) Level 2 (Red)      Shoulder Exercises: ROM/Strengthening   UBE (Upper Arm Bike) Level 1; forward 1'; reverse 1'; pace 5    Wall Wash 10x flexion; 10x abduction    Over Head Lace Lacing and unlacing; requiring 3 min and 45 sec    Ball on Wall 1' flexion, 1' abduction, green weighted ball; 2x      Functional Reaching Activities   High Level 10 cones flexion; 10 cones abduction; 2lb wrist weight      Manual Therapy   Manual Therapy Myofascial release    Manual therapy comments Manual therapy completed prior to exercises.     Myofascial Release Myofascial release and manual stretching completed to left UE to decrease fascial restrictions and increase joint mobility in a pain free zone.                   OT Education - 07/27/19 1541    Education Details Providing theraband exericses anad focused on Internal rotation, external rotation, and flexion.    Person(s) Educated Patient    Methods Explanation;Demonstration;Handout;Verbal cues     Comprehension Returned demonstration;Verbalized understanding            OT Short Term Goals - 07/11/19 1510      OT SHORT TERM GOAL #1   Title Patient will be educated and independent with her HEP in order to faciliate her progress in therapy and allow her to begin to use her LUE for daily tasks.    Time 6    Period Weeks    Status On-going    Target  Date 06/15/19      OT SHORT TERM GOAL #2   Title Patient will increaser her LUE P/ROM to [facility] in order to increase ability to complete dressing tasks independently.    Time 6    Period Weeks    Status Partially Met      OT SHORT TERM GOAL #3   Title Patient will increase LUE strength to 3+/5 in order to complete waist level tasks without difficulty.    Time 6    Period Weeks    Status Achieved      OT SHORT TERM GOAL #4   Title Patient will report a decrease in pain of approximately 6/10 or less when completing daily tasks.    Time 6    Period Weeks    Status Achieved             OT Long Term Goals - 07/11/19 1511      OT LONG TERM GOAL #1   Title Patient will decrease her left UE fascial restrictions to trace amount in order to increase functional mobility needed to complete reaching tasks above shoulder level.    Time 12    Period Weeks    Status On-going      OT LONG TERM GOAL #2   Title Patient will increase her LUE A/ROM to [facility] in order to complete household chores and yarwdwork tasks while using her LUE.    Time 12    Period Weeks    Status Partially Met      OT LONG TERM GOAL #3   Title Patient will increase her LUE strength to 4+/5 in order to return to walking her dog with increased shoulder and scapular stability.    Time 12    Period Weeks    Status On-going      OT LONG TERM GOAL #4   Title Patient will report a decrease in pain of approximately 3/10 or less in her LUE while completing daily tasks.    Time 12    Period Weeks    Status On-going                 Plan - 07/27/19 1543     Clinical Impression Statement A: Continued myofascial release and noting improvements in tension with main focus area being upper traps. Continues AAROM with dowel rod and wall stretches. Continued proxmial stretching with ball on wall, weight cone pick up, pver head lacing, and UB bike. Providing handout for HEP on theraband exericses for inter/exten rotation and flexion. Cues throughout on form and technique.    Body Structure / Function / Physical Skills ADL;ROM;Fascial restriction;Strength;Pain;UE functional use;Mobility    Plan P: Follow up on HEP, continue with A/ROM, overhead lacing, IR/er work    OT Home Exercise Plan eval: table slides, A/ROM wrist and elbow; 6/29: A/ROM; 7/2: scapular theraband-red; 7/7: IR work, functional reaching 7/15/ therabanad for ex/in rotation and flexion    Consulted and Agree with Plan of Care Patient           Patient will benefit from skilled therapeutic intervention in order to improve the following deficits and impairments:   Body Structure / Function / Physical Skills: ADL, ROM, Fascial restriction, Strength, Pain, UE functional use, Mobility       Visit Diagnosis: Stiffness of left shoulder, not elsewhere classified  Acute pain of left shoulder  Other symptoms and signs involving the musculoskeletal system    Problem List Patient Active Problem List  Diagnosis Date Noted  . Essential hypertension, benign 05/15/2019  . S/P left rotator cuff repair 04/20/19  04/27/2019  . Complete tear of left rotator cuff   . Uncontrolled type 2 diabetes mellitus with hyperglycemia ([place]) 10/10/2018  . Mixed hyperlipidemia 10/10/2018  . Sacroiliac joint pain 02/23/2017  . Chronic low back pain 02/09/2017  . Somatic dysfunction of left sacroiliac joint 02/09/2017  . GAD (generalized anxiety disorder) 12/17/2016  . MDD (major depressive disorder), recurrent episode, moderate ([place]) 12/17/2016  . Attention deficit hyperactivity disorder (ADHD) 12/17/2016   . Postoperative anemia due to acute blood loss 11/04/2015  . Bladder cancer ([place]) 10/31/2015  . CARPAL TUNNEL SYNDROME 10/02/2009    Neal Dy, MSOT, OTR/L 07/27/2019, 3:49 PM  [place] [address] [place], [place], [address] Phone: [phone]   Fax:  [phone]  Name: DETRA BORES MRN: [id number] Date of Birth: Oct 14, 1975

## 2019-07-27 NOTE — Patient Instructions (Signed)

## 2019-08-01 ENCOUNTER — Other Ambulatory Visit: Payer: Self-pay

## 2019-08-01 ENCOUNTER — Ambulatory Visit (HOSPITAL_COMMUNITY): Payer: Medicaid Other | Admitting: Occupational Therapy

## 2019-08-01 DIAGNOSIS — R29898 Other symptoms and signs involving the musculoskeletal system: Secondary | ICD-10-CM

## 2019-08-01 DIAGNOSIS — M25612 Stiffness of left shoulder, not elsewhere classified: Secondary | ICD-10-CM

## 2019-08-01 DIAGNOSIS — M25512 Pain in left shoulder: Secondary | ICD-10-CM

## 2019-08-01 MED ORDER — HYDROCODONE-ACETAMINOPHEN 5-325 MG PO TABS: 1.0000 | ORAL_TABLET | Freq: Three times a day (TID) | ORAL | 0 refills | Status: AC | PRN

## 2019-08-01 NOTE — Therapy (Signed)
[place], [place], [address] Phone: [phone]   Fax:  [phone]  Occupational Therapy Treatment  Patient Details  Name: Sandra [name] MRN: [id number] Date of Birth: [birth year] Referring Provider (OT): Arther Abbott, MD   Encounter Date: 08/01/2019   OT End of Session - 08/01/19 1540    Visit Number 13    Number of Visits 24    Date for OT Re-Evaluation 09/12/19    Authorization Type medicaid    Authorization Time Period 12 visits approved 7/20-731    Authorization - Visit Number 1    Authorization - Number of Visits 12    OT Start Time 1518    OT Stop Time 1556    OT Time Calculation (min) 38 min    Activity Tolerance Patient tolerated treatment well    Behavior During Therapy [facility] for tasks assessed/performed           Past Medical History:  Diagnosis Date   ADHD (attention deficit hyperactivity disorder)    Allergic rhinitis    Anemia    hx of   Bipolar 1 disorder ([place])    Bladder tumor    Cancer ([place])    bladder   Dizziness    Full dentures    GERD (gastroesophageal reflux disease)    Gross hematuria    Hyperlipidemia    Hypertension    Type 2 diabetes mellitus ([place])    Urgency of urination    dysuria, sui   Wears glasses     Past Surgical History:  Procedure Laterality Date   CYSTOSCOPY N/A 10/03/2015   Procedure: CYSTOSCOPY;  Surgeon: Irine Seal, MD;  Location: [facility];  Service: Urology;  Laterality: N/A;   CYSTOSCOPY WITH STENT PLACEMENT Right 10/31/2015   Procedure: CYSTOSCOPY WITH ATTEMPTED RIGHT URETERAL OPENING;  Surgeon: Irine Seal, MD;  Location: [facility];  Service: Urology;  Laterality: Right;   INTERSTIM IMPLANT PLACEMENT N/A 03/14/2018   Procedure: Barrie Lyme IMPLANT FIRST STAGE;  Surgeon: Cleon Gustin, MD;  Location[facility];  Service: Urology;  Laterality: N/A;   INTERSTIM IMPLANT PLACEMENT N/A 03/30/2018    Procedure: Barrie Lyme IMPLANT SECOND STAGE;  Surgeon: Cleon Gustin, MD;  Location: [facility];  Service: Urology;  Laterality: N/A;   MULTIPLE TOOTH EXTRACTIONS  2015   OVARIAN CYST REMOVAL Right 2004 approx   SHOULDER OPEN ROTATOR CUFF REPAIR Left 04/20/2019   Procedure: ROTATOR CUFF REPAIR SHOULDER OPEN;  Surgeon: Carole Civil, MD;  Location: [facility];  Service: Orthopedics;  Laterality: Left;   TONSILLECTOMY  12/30/2004   TOTAL ABDOMINAL HYSTERECTOMY W/ BILATERAL SALPINGOOPHORECTOMY  05/16/2004   TRANSURETHRAL RESECTION OF BLADDER TUMOR N/A 10/03/2015   Procedure: TRANSURETHRAL RESECTION OF BLADDER TUMOR (TURBT);  Surgeon: Irine Seal, MD;  Location: [facility];  Service: Urology;  Laterality: N/A;   TRANSURETHRAL RESECTION OF BLADDER TUMOR N/A 10/31/2015   Procedure: RE-STAGINGG TRANSURETHRAL RESECTION OF BLADDER TUMOR (TURBT);  Surgeon: Irine Seal, MD;  Location: [facility];  Service: Urology;  Laterality: N/A;   TRANSURETHRAL RESECTION OF BLADDER TUMOR N/A 08/17/2016   Procedure: TRANSURETHRAL RESECTION OF BLADDER TUMOR (TURBT);  Surgeon: Cleon Gustin, MD;  [facility];  Service: Urology;  Laterality: N/A;    There were no vitals filed for this visit.   Subjective Assessment - 08/01/19 1520    Subjective  S: I didn't have a good night last night.    Currently in  Pain? Yes    Pain Score 6     Pain Location Shoulder    Pain Orientation Left              [facility] OT Assessment - 08/01/19 0001      Assessment   Medical Diagnosis Left shoulder RTC repair    Referring Provider (OT) Arther Abbott, MD      Precautions   Precautions Shoulder    Type of Shoulder Precautions progress as tolerated      ROM / Strength   AROM / PROM / Strength AROM;PROM;Strength      AROM   Overall AROM Comments Assessed seated, er/IR adducted    AROM Assessment Site Shoulder    Right/Left Shoulder Left    Left Shoulder Flexion 160 Degrees    161   Left Shoulder ABduction 136 Degrees   102   Left Shoulder Internal Rotation 90 Degrees   90   Left Shoulder External Rotation 84 Degrees   72     PROM   Overall PROM Comments Assessed supine. IR/er adducted    PROM Assessment Site Shoulder    Right/Left Shoulder Left    Left Shoulder Flexion 165 Degrees   164   Left Shoulder ABduction 156 Degrees   155   Left Shoulder Internal Rotation 90 Degrees   90   Left Shoulder External Rotation 73 Degrees   84     Strength   Overall Strength Comments Assessed seated, er/IR adducted    Strength Assessment Site Shoulder    Right/Left Shoulder Left    Left Shoulder Flexion 4+/5    Left Shoulder ABduction 4+/5    Left Shoulder Internal Rotation 5/5    Left Shoulder External Rotation 4+/5                    OT Treatments/Exercises (OP) - 08/01/19 1519      Exercises   Exercises Shoulder      Shoulder Exercises: Supine   External Rotation PROM;5 reps    Internal Rotation PROM;5 reps    Flexion PROM;5 reps    ABduction PROM;5 reps      Shoulder Exercises: ROM/Strengthening   "W" Arms 20x    Proximal Shoulder Strengthening, Supine 1' each. no rest breaks between    [place] on Wall 1' flexion, 1' abduction, tennis ball    Other ROM/Strengthening Exercises Internal rotation with tennis ball pass behind back; 10x both direction    Other ROM/Strengthening Exercises Blue ball rolling around body while seated in chair; 10x both directions      Manual Therapy   Manual Therapy Myofascial release    Manual therapy comments Manual therapy completed prior to exercises.     Myofascial Release Myofascial release and manual stretching completed to left UE to decrease fascial restrictions and increase joint mobility in a pain free zone.                     OT Short Term Goals - 07/11/19 1510      OT SHORT TERM GOAL #1   Title Patient will be educated and independent with her HEP in order to faciliate her progress in therapy  and allow her to begin to use her LUE for daily tasks.    Time 6    Period Weeks    Status On-going    Target Date 06/15/19      OT SHORT TERM GOAL #2   Title Patient will increaser her  LUE P/ROM to [facility] in order to increase ability to complete dressing tasks independently.    Time 6    Period Weeks    Status Partially Met      OT SHORT TERM GOAL #3   Title Patient will increase LUE strength to 3+/5 in order to complete waist level tasks without difficulty.    Time 6    Period Weeks    Status Achieved      OT SHORT TERM GOAL #4   Title Patient will report a decrease in pain of approximately 6/10 or less when completing daily tasks.    Time 6    Period Weeks    Status Achieved             OT Long Term Goals - 07/11/19 1511      OT LONG TERM GOAL #1   Title Patient will decrease her left UE fascial restrictions to trace amount in order to increase functional mobility needed to complete reaching tasks above shoulder level.    Time 12    Period Weeks    Status On-going      OT LONG TERM GOAL #2   Title Patient will increase her LUE A/ROM to [facility] in order to complete household chores and yarwdwork tasks while using her LUE.    Time 12    Period Weeks    Status Partially Met      OT LONG TERM GOAL #3   Title Patient will increase her LUE strength to 4+/5 in order to return to walking her dog with increased shoulder and scapular stability.    Time 12    Period Weeks    Status On-going      OT LONG TERM GOAL #4   Title Patient will report a decrease in pain of approximately 3/10 or less in her LUE while completing daily tasks.    Time 12    Period Weeks    Status On-going                 Plan - 08/01/19 1535    Clinical Impression Statement A: Mini-reassessment completed for measurements. Pt continues to demonstrate decreased AROM and reports increased pain with active movement; especially in abduction and external rotation. Pt reporting continued pain with  certain ADLs that require "behind back" reaching including donning a bra or toilet hygiene. Continued myofascial release, PROM, proximal strengthening, and AROM. Cues for form and techniques provided throughout.    Body Structure / Function / Physical Skills ADL;ROM;Fascial restriction;Strength;Pain;UE functional use;Mobility    Plan P: Planning for 6 more weeks 2x wk.  Continue follow up on HEP, continue with A/ROM, overhead lacing, IR/er work    OT Home Exercise Plan eval: table slides, A/ROM wrist and elbow; 6/29: A/ROM; 7/2: scapular theraband-red; 7/7: IR work, functional reaching 7/15/ therabanad for ex/in rotation and flexion    Consulted and Agree with Plan of Care Patient           Patient will benefit from skilled therapeutic intervention in order to improve the following deficits and impairments:   Body Structure / Function / Physical Skills: ADL, ROM, Fascial restriction, Strength, Pain, UE functional use, Mobility       Visit Diagnosis: Stiffness of left shoulder, not elsewhere classified  Acute pain of left shoulder  Other symptoms and signs involving the musculoskeletal system    Problem List Patient Active Problem List   Diagnosis Date Noted   Essential hypertension, benign 05/15/2019  S/P left rotator cuff repair 04/20/19  04/27/2019   Complete tear of left rotator cuff    Uncontrolled type 2 diabetes mellitus with hyperglycemia ([place]) 10/10/2018   Mixed hyperlipidemia 10/10/2018   Sacroiliac joint pain 02/23/2017   Chronic low back pain 02/09/2017   Somatic dysfunction of left sacroiliac joint 02/09/2017   GAD (generalized anxiety disorder) 12/17/2016   MDD (major depressive disorder), recurrent episode, moderate ([place]) 12/17/2016   Attention deficit hyperactivity disorder (ADHD) 12/17/2016   Postoperative anemia due to acute blood loss 11/04/2015   Bladder cancer ([place]) 10/31/2015   CARPAL TUNNEL SYNDROME 10/02/2009    Neal Dy, MSOT  ,OTR/L 08/01/2019, 4:19 PM  [place] [address] [place], [place], [address] Phone: [phone]   Fax:  [phone]  Name: JAMINA MACBETH MRN: [id number] Date of Birth: 04-16-1975

## 2019-08-04 ENCOUNTER — Encounter (HOSPITAL_COMMUNITY): Payer: Medicaid Other | Admitting: Occupational Therapy

## 2019-08-08 ENCOUNTER — Other Ambulatory Visit: Payer: Self-pay | Admitting: Orthopedic Surgery

## 2019-08-08 ENCOUNTER — Other Ambulatory Visit: Payer: Self-pay | Admitting: Podiatry

## 2019-08-08 ENCOUNTER — Other Ambulatory Visit: Payer: Self-pay

## 2019-08-10 ENCOUNTER — Encounter (HOSPITAL_COMMUNITY): Payer: Medicaid Other | Admitting: Occupational Therapy

## 2019-08-10 MED ORDER — HYDROCODONE-ACETAMINOPHEN 5-325 MG PO TABS: 1.0000 | ORAL_TABLET | Freq: Three times a day (TID) | ORAL | 0 refills | Status: AC | PRN

## 2019-09-05 ENCOUNTER — Encounter: Payer: Self-pay | Admitting: Orthopedic Surgery

## 2019-09-05 ENCOUNTER — Ambulatory Visit: Payer: Medicaid Other | Admitting: Orthopedic Surgery

## 2019-09-10 ENCOUNTER — Encounter: Payer: Self-pay | Admitting: Internal Medicine

## 2019-09-19 ENCOUNTER — Ambulatory Visit: Payer: Medicaid Other | Admitting: Nurse Practitioner

## 2019-10-05 ENCOUNTER — Ambulatory Visit: Payer: Medicaid Other | Admitting: Nurse Practitioner

## 2019-10-19 ENCOUNTER — Ambulatory Visit: Payer: Medicaid Other | Admitting: Nurse Practitioner

## 2019-11-06 NOTE — Progress Notes (Deleted)
Primary Care Physician:  Joyice Faster, FNP Primary Gastroenterologist:  Dr. Gala Romney  No chief complaint on file.   HPI:   Sandra [name] is a 44 y.o. female who presents on referral from primary care for esophageal reflux.  Reviewed information provided with referral including ***.  No history of colonoscopy or endoscopy found in our system.  Today she states she is doing okay overall.  Past Medical History:  Diagnosis Date  . ADHD (attention deficit hyperactivity disorder)   . Allergic rhinitis   . Anemia    hx of  . Bipolar 1 disorder ([place])   . Bladder tumor   . Cancer ([place])    bladder  . Dizziness   . Full dentures   . GERD (gastroesophageal reflux disease)   . Gross hematuria   . Hyperlipidemia   . Hypertension   . Type 2 diabetes mellitus ([place])   . Urgency of urination    dysuria, sui  . Wears glasses     Past Surgical History:  Procedure Laterality Date  . CYSTOSCOPY N/A 10/03/2015   Procedure: CYSTOSCOPY;  Surgeon: Irine Seal, MD;  Location: [facility];  Service: Urology;  Laterality: N/A;  . CYSTOSCOPY WITH STENT PLACEMENT Right 10/31/2015   Procedure: CYSTOSCOPY WITH ATTEMPTED RIGHT URETERAL OPENING;  Surgeon: Irine Seal, MD;  Location: [facility];  Service: Urology;  Laterality: Right;  . INTERSTIM IMPLANT PLACEMENT N/A 03/14/2018   Procedure: Barrie Lyme IMPLANT FIRST STAGE;  Surgeon: Cleon Gustin, MD;  Location[facility];  Service: Urology;  Laterality: N/A;  . INTERSTIM IMPLANT PLACEMENT N/A 03/30/2018   Procedure: Barrie Lyme IMPLANT SECOND STAGE;  Surgeon: Cleon Gustin, MD;  Location[facility];  Service: Urology;  Laterality: N/A;  . MULTIPLE TOOTH EXTRACTIONS  2015  . OVARIAN CYST REMOVAL Right 2004 approx  . SHOULDER OPEN ROTATOR CUFF REPAIR Left 04/20/2019   Procedure: ROTATOR CUFF REPAIR SHOULDER OPEN;  Surgeon: Carole Civil, MD;  Location: [facility];  Service: Orthopedics;  Laterality: Left;  .  TONSILLECTOMY  12/30/2004  . TOTAL ABDOMINAL HYSTERECTOMY W/ BILATERAL SALPINGOOPHORECTOMY  05/16/2004  . TRANSURETHRAL RESECTION OF BLADDER TUMOR N/A 10/03/2015   Procedure: TRANSURETHRAL RESECTION OF BLADDER TUMOR (TURBT);  Surgeon: Irine Seal, MD;  Location: [facility];  Service: Urology;  Laterality: N/A;  . TRANSURETHRAL RESECTION OF BLADDER TUMOR N/A 10/31/2015   Procedure: RE-STAGINGG TRANSURETHRAL RESECTION OF BLADDER TUMOR (TURBT);  Surgeon: Irine Seal, MD;  Location: [facility];  Service: Urology;  Laterality: N/A;  . TRANSURETHRAL RESECTION OF BLADDER TUMOR N/A 08/17/2016   Procedure: TRANSURETHRAL RESECTION OF BLADDER TUMOR (TURBT);  Surgeon: Cleon Gustin, MD;  [facility];  Service: Urology;  Laterality: N/A;    Current Outpatient Medications  Medication Sig Dispense Refill  . Blood Glucose Monitoring Suppl (ACCU-CHEK GUIDE) w/Device KIT 1 Piece by Does not apply route as directed. 1 kit 0  . Blood Glucose Monitoring Suppl (ACCU-CHEK GUIDE) w/Device KIT 1 Piece by Does not apply route as directed. 1 kit 0  . cyclobenzaprine (FLEXERIL) 10 MG tablet Take 1 tablet (10 mg total) by mouth 3 (three) times daily. 40 tablet 1  . FLUoxetine (PROZAC) 20 MG capsule Take 20 mg by mouth at bedtime.     . gabapentin (NEURONTIN) 300 MG capsule TAKE 1 CAPSULE(300 MG) BY MOUTH THREE TIMES DAILY 90 capsule 0  . glipiZIDE (GLUCOTROL XL) 5 MG 24 hr tablet Take 1 tablet (5 mg total)  by mouth daily with breakfast. 90 tablet 1  . glucose blood (ACCU-CHEK GUIDE) test strip Use as instructed 150 each 2  . hydrochlorothiazide (MICROZIDE) 12.5 MG capsule Take 12.5 mg by mouth daily.    . insulin glargine (LANTUS) 100 UNIT/ML Solostar Pen Inject 24 Units into the skin at bedtime. 5 pen 3  . Insulin Pen Needle (B-D ULTRAFINE III SHORT PEN) 31G X 8 MM MISC 1 each by Does not apply route as directed. 100 each 3  . levocetirizine (XYZAL) 5 MG tablet Take 5 mg by mouth  every evening.    . metFORMIN (GLUCOPHAGE) 1000 MG tablet Take 1 tablet (1,000 mg total) by mouth 2 (two) times daily with a meal. 180 tablet 1  . montelukast (SINGULAIR) 10 MG tablet Take 10 mg by mouth at bedtime.    . pantoprazole (PROTONIX) 40 MG tablet Take 40 mg by mouth at bedtime.   2  . promethazine (PHENERGAN) 12.5 MG tablet Take 1 tablet (12.5 mg total) by mouth every 6 (six) hours as needed for nausea or vomiting. 30 tablet 0  . promethazine (PHENERGAN) 25 MG tablet Take 1 tablet (25 mg total) by mouth every 6 (six) hours as needed for nausea. 12 tablet 0  . rosuvastatin (CRESTOR) 20 MG tablet Take 1 tablet (20 mg total) by mouth daily. 90 tablet 1   Current Facility-Administered Medications  Medication Dose Route Frequency Provider Last Rate Last Admin  . betamethasone acetate-betamethasone sodium phosphate (CELESTONE) injection 3 mg  3 mg Intramuscular Once Edrick Kins, DPM        Allergies as of 11/07/2019 - Review Complete 07/27/2019  Allergen Reaction Noted  . Benazepril Hives 11/25/2014  . Ibuprofen Hives 05/12/2016  . Metronidazole Nausea And Vomiting   . Oxycodone  08/19/2016  . Adhesive [tape] Rash 09/30/2015  . Codeine Nausea And Vomiting and Rash   . Loratadine Rash   . Nystatin-triamcinolone Rash 02/24/2018  . Penicillins Rash     Family History  Problem Relation Age of Onset  . Hypertension Mother   . Cancer Mother   . Breast cancer Maternal Aunt   . Lung cancer Maternal Uncle   . Cancer Other   . Diabetes Daughter     Social History   Socioeconomic History  . Marital status: Legally Separated    Spouse name: Not on file  . Number of children: Not on file  . Years of education: Not on file  . Highest education level: Not on file  Occupational History  . Not on file  Tobacco Use  . Smoking status: Current Every Day Smoker    Packs/day: 0.50    Types: Cigarettes  . Smokeless tobacco: Never Used  Vaping Use  . Vaping Use: Never used    Substance and Sexual Activity  . Alcohol use: No  . Drug use: No  . Sexual activity: Not Currently    Birth control/protection: Surgical    Comment: hyst  Other Topics Concern  . Not on file  Social History Narrative  . Not on file   Social Determinants of Health   Financial Resource Strain:   . Difficulty of Paying Living Expenses: Not on file  Food Insecurity:   . Worried About Charity fundraiser in the Last Year: Not on file  . Ran Out of Food in the Last Year: Not on file  Transportation Needs:   . Lack of Transportation (Medical): Not on file  . Lack of Transportation (Non-Medical): Not on  file  Physical Activity:   . Days of Exercise per Week: Not on file  . Minutes of Exercise per Session: Not on file  Stress:   . Feeling of Stress : Not on file  Social Connections:   . Frequency of Communication with Friends and Family: Not on file  . Frequency of Social Gatherings with Friends and Family: Not on file  . Attends Religious Services: Not on file  . Active Member of Clubs or Organizations: Not on file  . Attends Archivist Meetings: Not on file  . Marital Status: Not on file  Intimate Partner Violence:   . Fear of Current or Ex-Partner: Not on file  . Emotionally Abused: Not on file  . Physically Abused: Not on file  . Sexually Abused: Not on file    Subjective:*** Review of Systems  Constitutional: Negative for chills, fever, malaise/fatigue and weight loss.  HENT: Negative for congestion and sore throat.   Respiratory: Negative for cough and shortness of breath.   Cardiovascular: Negative for chest pain and palpitations.  Gastrointestinal: Negative for abdominal pain, blood in stool, diarrhea, melena, nausea and vomiting.  Musculoskeletal: Negative for joint pain and myalgias.  Skin: Negative for rash.  Neurological: Negative for dizziness and weakness.  Endo/Heme/Allergies: Does not bruise/bleed easily.  Psychiatric/Behavioral: Negative for  depression. The patient is not nervous/anxious.   All other systems reviewed and are negative.      Objective: There were no vitals taken for this visit. Physical Exam Vitals and nursing note reviewed.  Constitutional:      General: She is not in acute distress.    Appearance: Normal appearance. She is well-developed. She is not ill-appearing, toxic-appearing or diaphoretic.  HENT:     Head: Normocephalic and atraumatic.     Nose: No congestion or rhinorrhea.  Eyes:     General: No scleral icterus. Cardiovascular:     Rate and Rhythm: Normal rate and regular rhythm.     Heart sounds: Normal heart sounds.  Pulmonary:     Effort: Pulmonary effort is normal. No respiratory distress.     Breath sounds: Normal breath sounds.  Abdominal:     General: Bowel sounds are normal.     Palpations: Abdomen is soft. There is no hepatomegaly, splenomegaly or mass.     Tenderness: There is no abdominal tenderness. There is no guarding or rebound.     Hernia: No hernia is present.  Skin:    General: Skin is warm and dry.     Coloration: Skin is not jaundiced.     Findings: No rash.  Neurological:     General: No focal deficit present.     Mental Status: She is alert and oriented to person, place, and time.  Psychiatric:        Attention and Perception: Attention normal.        Mood and Affect: Mood normal.        Speech: Speech normal.        Behavior: Behavior normal.        Thought Content: Thought content normal.        Cognition and Memory: Cognition and memory normal.      Assessment:  ***   Plan: ***    Thank you for allowing [place] to participate in the care of [place], DNP, AGNP-C Adult & Gerontological Nurse Practitioner [facility]   11/06/2019 7:57 PM   Disclaimer: This note was dictated with voice  recognition software. Similar sounding words can inadvertently be transcribed and may not be corrected upon review.

## 2019-11-07 ENCOUNTER — Ambulatory Visit: Payer: Medicaid Other | Admitting: Nurse Practitioner

## 2019-11-12 ENCOUNTER — Encounter (HOSPITAL_COMMUNITY): Payer: Self-pay

## 2019-11-12 ENCOUNTER — Other Ambulatory Visit: Payer: Self-pay

## 2019-11-12 ENCOUNTER — Emergency Department (HOSPITAL_COMMUNITY): Payer: Medicaid Other

## 2019-11-12 ENCOUNTER — Other Ambulatory Visit (HOSPITAL_COMMUNITY): Payer: Self-pay | Admitting: Family

## 2019-11-12 ENCOUNTER — Emergency Department (HOSPITAL_COMMUNITY)
Admission: EM | Admit: 2019-11-12 | Discharge: 2019-11-12 | Disposition: A | Discharge status: Home / Self Care | Payer: Medicaid Other | Attending: Emergency Medicine | Admitting: Emergency Medicine

## 2019-11-12 DIAGNOSIS — Z8551 Personal history of malignant neoplasm of bladder: Secondary | ICD-10-CM | POA: Insufficient documentation

## 2019-11-12 DIAGNOSIS — R739 Hyperglycemia, unspecified: Secondary | ICD-10-CM

## 2019-11-12 DIAGNOSIS — R0602 Shortness of breath: Secondary | ICD-10-CM | POA: Diagnosis present

## 2019-11-12 DIAGNOSIS — E119 Type 2 diabetes mellitus without complications: Secondary | ICD-10-CM | POA: Insufficient documentation

## 2019-11-12 DIAGNOSIS — I10 Essential (primary) hypertension: Secondary | ICD-10-CM | POA: Diagnosis not present

## 2019-11-12 DIAGNOSIS — F17210 Nicotine dependence, cigarettes, uncomplicated: Secondary | ICD-10-CM | POA: Insufficient documentation

## 2019-11-12 DIAGNOSIS — U071 COVID-19: Secondary | ICD-10-CM | POA: Insufficient documentation

## 2019-11-12 DIAGNOSIS — E785 Hyperlipidemia, unspecified: Secondary | ICD-10-CM | POA: Diagnosis not present

## 2019-11-12 DIAGNOSIS — Z79899 Other long term (current) drug therapy: Secondary | ICD-10-CM | POA: Insufficient documentation

## 2019-11-12 DIAGNOSIS — Z794 Long term (current) use of insulin: Secondary | ICD-10-CM | POA: Insufficient documentation

## 2019-11-12 LAB — RESPIRATORY PANEL BY RT PCR (FLU A&B, COVID)
Influenza A by PCR: NEGATIVE
Influenza B by PCR: NEGATIVE
SARS Coronavirus 2 by RT PCR: POSITIVE — AB

## 2019-11-12 LAB — CBG MONITORING, ED
Glucose-Capillary: 314 mg/dL — ABNORMAL HIGH (ref 70–99)
Glucose-Capillary: 231 mg/dL — ABNORMAL HIGH (ref 70–99)

## 2019-11-12 MED ORDER — ALBUTEROL SULFATE HFA 108 (90 BASE) MCG/ACT IN AERS
2.0000 | INHALATION_SPRAY | Freq: Once | RESPIRATORY_TRACT | Status: AC
  Administered 2019-11-12: 2 via RESPIRATORY_TRACT
  Filled 2019-11-12: qty 6.7

## 2019-11-12 MED ORDER — ACETAMINOPHEN 325 MG PO TABS
650.0000 mg | ORAL_TABLET | Freq: Once | ORAL | Status: AC
  Administered 2019-11-12: 650 mg via ORAL
  Filled 2019-11-12: qty 2

## 2019-11-12 MED ORDER — INSULIN ASPART 100 UNIT/ML ~~LOC~~ SOLN
8.0000 [IU] | Freq: Once | SUBCUTANEOUS | Status: AC
  Administered 2019-11-12: 8 [IU] via SUBCUTANEOUS
  Filled 2019-11-12: qty 1

## 2019-11-12 NOTE — ED Notes (Signed)
I assumed care of this patient at this time. At the time of assuming care, PA at bedside for assessment. Pt appears to be sitting in bed with no obvious signs of distress. Bed is locked in the lowest position, side rails x2, call bell within reach. Pt educated on call light use and hourly rounding. All questions and concerns voiced addressed at this time.

## 2019-11-12 NOTE — Discharge Instructions (Addendum)
Your Covid test this evening is positive.  I have notified the monoclonal antibody infusion clinic.  Someone from the clinic will contact you to arrange a time to come in for the antibody infusion.  If you need to call the clinic the number is [phone].  You have been given an albuterol inhaler.  Use 1 to 2 puffs every 4-6 hours as needed for wheezing or shortness of breath.  Follow-up with your primary doctor for recheck.  You may want to purchase a portable pulse oximeter to monitor your oxygen levels at home.  If your oxygen level consistently stays below 90% please return to the emergency department for evaluation.  Your blood sugar this evening was elevated.  Monitor your blood sugar at home and recheck it this evening before taking your Lantus as you may need to reduce your dose for this evening.

## 2019-11-12 NOTE — ED Provider Notes (Signed)
[facility] EMERGENCY DEPARTMENT Provider Note   CSN: [id number] Arrival date & time: 11/12/19  1653     History Chief Complaint  Patient presents with  . Shortness of Breath    Shawntae Lowy Fera is a 44 y.o. female.  HPI      TRIVIA HEFFELFINGER is a 44 y.o. female with past medical history anemia, hypertension, type 2 diabetes, hyperlipidemia who presents to the Emergency Department complaining of nasal congestion, cough, fatigue and generalized body aches.  Symptoms began 5 days ago.  Loss her taste and smell 2 days ago.  Describes her cough as occasional and mostly non productive.  Also reports feeling short of breath.  No chest pain, fever, abdominal pain and diarrhea.  No known covid exposures and reports that she has received both covid vaccines.   Past Medical History:  Diagnosis Date  . ADHD (attention deficit hyperactivity disorder)   . Allergic rhinitis   . Anemia    hx of  . Bipolar 1 disorder ([place])   . Bladder tumor   . Cancer ([place])    bladder  . Dizziness   . Full dentures   . GERD (gastroesophageal reflux disease)   . Gross hematuria   . Hyperlipidemia   . Hypertension   . Type 2 diabetes mellitus ([place])   . Urgency of urination    dysuria, sui  . Wears glasses     Patient Active Problem List   Diagnosis Date Noted  . Essential hypertension, benign 05/15/2019  . S/P left rotator cuff repair 04/20/19  04/27/2019  . Complete tear of left rotator cuff   . Uncontrolled type 2 diabetes mellitus with hyperglycemia ([place]) 10/10/2018  . Mixed hyperlipidemia 10/10/2018  . Sacroiliac joint pain 02/23/2017  . Chronic low back pain 02/09/2017  . Somatic dysfunction of left sacroiliac joint 02/09/2017  . GAD (generalized anxiety disorder) 12/17/2016  . MDD (major depressive disorder), recurrent episode, moderate ([place]) 12/17/2016  . Attention deficit hyperactivity disorder (ADHD) 12/17/2016  . Postoperative anemia due to acute blood loss 11/04/2015  . Bladder cancer  ([place]) 10/31/2015  . CARPAL TUNNEL SYNDROME 10/02/2009    Past Surgical History:  Procedure Laterality Date  . CYSTOSCOPY N/A 10/03/2015   Procedure: CYSTOSCOPY;  Surgeon: Irine Seal, MD;  Location: [facility];  Service: Urology;  Laterality: N/A;  . CYSTOSCOPY WITH STENT PLACEMENT Right 10/31/2015   Procedure: CYSTOSCOPY WITH ATTEMPTED RIGHT URETERAL OPENING;  Surgeon: Irine Seal, MD;  Location: [facility];  Service: Urology;  Laterality: Right;  . INTERSTIM IMPLANT PLACEMENT N/A 03/14/2018   Procedure: Barrie Lyme IMPLANT FIRST STAGE;  Surgeon: Cleon Gustin, MD;  Location: [facility];  Service: Urology;  Laterality: N/A;  . INTERSTIM IMPLANT PLACEMENT N/A 03/30/2018   Procedure: Barrie Lyme IMPLANT SECOND STAGE;  Surgeon: Cleon Gustin, MD;  Location: [facility];  Service: Urology;  Laterality: N/A;  . MULTIPLE TOOTH EXTRACTIONS  2015  . OVARIAN CYST REMOVAL Right 2004 approx  . SHOULDER OPEN ROTATOR CUFF REPAIR Left 04/20/2019   Procedure: ROTATOR CUFF REPAIR SHOULDER OPEN;  Surgeon: Carole Civil, MD;  Location: [facility];  Service: Orthopedics;  Laterality: Left;  . TONSILLECTOMY  12/30/2004  . TOTAL ABDOMINAL HYSTERECTOMY W/ BILATERAL SALPINGOOPHORECTOMY  05/16/2004  . TRANSURETHRAL RESECTION OF BLADDER TUMOR N/A 10/03/2015   Procedure: TRANSURETHRAL RESECTION OF BLADDER TUMOR (TURBT);  Surgeon: Irine Seal, MD;  Location: [facility];  Service: Urology;  Laterality: N/A;  . TRANSURETHRAL RESECTION OF BLADDER TUMOR  N/A 10/31/2015   Procedure: RE-STAGINGG TRANSURETHRAL RESECTION OF BLADDER TUMOR (TURBT);  Surgeon: Irine Seal, MD;  Location: [facility];  Service: Urology;  Laterality: N/A;  . TRANSURETHRAL RESECTION OF BLADDER TUMOR N/A 08/17/2016   Procedure: TRANSURETHRAL RESECTION OF BLADDER TUMOR (TURBT);  Surgeon: Cleon Gustin, MD;  Location: [facility];  Service: Urology;  Laterality: N/A;     OB History    Gravida  2     Para      Term      Preterm      AB      Living  2     SAB      TAB      Ectopic      Multiple      Live Births              Family History  Problem Relation Age of Onset  . Hypertension Mother   . Cancer Mother   . Breast cancer Maternal Aunt   . Lung cancer Maternal Uncle   . Cancer Other   . Diabetes Daughter     Social History   Tobacco Use  . Smoking status: Current Every Day Smoker    Packs/day: 0.50    Types: Cigarettes  . Smokeless tobacco: Never Used  Vaping Use  . Vaping Use: Never used  Substance Use Topics  . Alcohol use: No  . Drug use: No    Home Medications Prior to Admission medications   Medication Sig Start Date End Date Taking? Authorizing Provider  Blood Glucose Monitoring Suppl (ACCU-CHEK GUIDE) w/Device KIT 1 Piece by Does not apply route as directed. 10/10/18   Cassandria Anger, MD  Blood Glucose Monitoring Suppl (ACCU-CHEK GUIDE) w/Device KIT 1 Piece by Does not apply route as directed. 02/15/19   Cassandria Anger, MD  cyclobenzaprine (FLEXERIL) 10 MG tablet Take 1 tablet (10 mg total) by mouth 3 (three) times daily. 07/04/19   Carole Civil, MD  FLUoxetine (PROZAC) 20 MG capsule Take 20 mg by mouth at bedtime.  02/24/18   [provider]  gabapentin (NEURONTIN) 300 MG capsule TAKE 1 CAPSULE(300 MG) BY MOUTH THREE TIMES DAILY 08/08/19   Edrick Kins, DPM  glipiZIDE (GLUCOTROL XL) 5 MG 24 hr tablet Take 1 tablet (5 mg total) by mouth daily with breakfast. 05/15/19   Nida, Marella Chimes, MD  glucose blood (ACCU-CHEK GUIDE) test strip Use as instructed 02/15/19   Cassandria Anger, MD  hydrochlorothiazide (MICROZIDE) 12.5 MG capsule Take 12.5 mg by mouth daily.    [provider]  insulin glargine (LANTUS) 100 UNIT/ML Solostar Pen Inject 24 Units into the skin at bedtime. 05/15/19   Cassandria Anger, MD  Insulin Pen Needle (B-D ULTRAFINE III SHORT PEN) 31G X 8 MM MISC 1 each by Does not apply  route as directed. 02/15/19   Cassandria Anger, MD  levocetirizine (XYZAL) 5 MG tablet Take 5 mg by mouth every evening.    [provider]  metFORMIN (GLUCOPHAGE) 1000 MG tablet Take 1 tablet (1,000 mg total) by mouth 2 (two) times daily with a meal. 05/15/19   Nida, Marella Chimes, MD  montelukast (SINGULAIR) 10 MG tablet Take 10 mg by mouth at bedtime.    [provider]  pantoprazole (PROTONIX) 40 MG tablet Take 40 mg by mouth at bedtime.  10/09/14   [provider]  promethazine (PHENERGAN) 12.5 MG tablet Take 1 tablet (12.5 mg total) by mouth  every 6 (six) hours as needed for nausea or vomiting. 04/20/19   Carole Civil, MD  promethazine (PHENERGAN) 25 MG tablet Take 1 tablet (25 mg total) by mouth every 6 (six) hours as needed for nausea. 03/01/11 03/08/11  Fredia Sorrow, MD  rosuvastatin (CRESTOR) 20 MG tablet Take 1 tablet (20 mg total) by mouth daily. 05/15/19   Cassandria Anger, MD    Allergies    Benazepril, Ibuprofen, Metronidazole, Oxycodone, Adhesive [tape], Codeine, Loratadine, Nystatin-triamcinolone, and Penicillins  Review of Systems   Review of Systems  Constitutional: Positive for fatigue. Negative for appetite change, chills and fever.  HENT: Positive for congestion. Negative for sore throat and trouble swallowing.   Respiratory: Positive for cough and shortness of breath. Negative for wheezing.   Cardiovascular: Negative for chest pain and leg swelling.  Gastrointestinal: Negative for abdominal pain, diarrhea, nausea and vomiting.  Genitourinary: Negative for dysuria.  Musculoskeletal: Positive for myalgias. Negative for arthralgias, neck pain and neck stiffness.  Skin: Negative for rash.  Neurological: Negative for dizziness, syncope, weakness, numbness and headaches.  Hematological: Negative for adenopathy.    Physical Exam Updated Vital Signs BP (!) 165/93   Pulse 84   Temp 98.2 F (36.8 C) (Oral)   Resp 18   Ht '4\' 11"'   (1.499 m)   Wt 72.6 kg   SpO2 96%   BMI 32.32 kg/m   Physical Exam Vitals and nursing note reviewed.  Constitutional:      General: She is not in acute distress.    Appearance: She is not ill-appearing or toxic-appearing.     Comments: Pt is anxious appearing and tearful  Cardiovascular:     Rate and Rhythm: Normal rate and regular rhythm.     Pulses: Normal pulses.  Pulmonary:     Effort: Pulmonary effort is normal. No respiratory distress.     Breath sounds: Normal breath sounds. No wheezing, rhonchi or rales.  Abdominal:     Palpations: Abdomen is soft.     Tenderness: There is no abdominal tenderness.  Musculoskeletal:        General: Normal range of motion.     Cervical back: Normal range of motion.     Right lower leg: No edema.     Left lower leg: No edema.  Skin:    General: Skin is warm.     Capillary Refill: Capillary refill takes less than 2 seconds.     Findings: No rash.  Neurological:     General: No focal deficit present.     Mental Status: She is alert.     Sensory: Sensation is intact. No sensory deficit.     Motor: Motor function is intact. No weakness.     Coordination: Coordination is intact.     Comments: CN II-XII grossly intact. Speech clear.     ED Results / Procedures / Treatments   Labs (all labs ordered are listed, but only abnormal results are displayed) Labs Reviewed  RESPIRATORY PANEL BY RT PCR (FLU A&B, COVID) - Abnormal; Notable for the following components:      Result Value   SARS Coronavirus 2 by RT PCR POSITIVE (*)    All other components within normal limits  CBG MONITORING, ED - Abnormal; Notable for the following components:   Glucose-Capillary 314 (*)    All other components within normal limits  CBG MONITORING, ED - Abnormal; Notable for the following components:   Glucose-Capillary 231 (*)    All other components within normal  limits    EKG EKG Interpretation  Date/Time:  Sunday November 12 2019 17:01:31  EDT Ventricular Rate:  84 PR Interval:  140 QRS Duration: 100 QT Interval:  390 QTC Calculation: 460 R Axis:   96 Text Interpretation: Normal sinus rhythm Rightward axis Borderline ECG No STEMI Confirmed by Octaviano Glow [phone]) on 11/12/2019 5:32:53 PM   Radiology DG Chest Portable 1 View  Result Date: 11/12/2019 CLINICAL DATA:  Shortness of breath. Cough. Congestion. Chest pain. Loss of taste and small 2-3 days ago. COVID test pending. EXAM: PORTABLE CHEST 1 VIEW COMPARISON:  Radiograph 02/24/2018 FINDINGS: Low lung volumes. Upper normal heart size with unchanged mediastinal contours, unchanged from prior. Mild interstitial coarsening. Minimal subsegmental opacity in the left mid lung zone. No confluent consolidation. No pleural fluid or pneumothorax. Anchor from rotator cuff repair in the left shoulder. IMPRESSION: Low lung volumes with mild interstitial coarsening, likely related smoking. Minimal subsegmental opacity in the left mid lung zone, favor atelectasis. Early/mild COVID pneumonia could have a similar appearance. Electronically Signed   By: Keith Rake M.D.   On: 11/12/2019 17:41    Procedures Procedures (including critical care time)  Medications Ordered in ED Medications  albuterol (VENTOLIN HFA) 108 (90 Base) MCG/ACT inhaler 2 puff (has no administration in time range)  insulin aspart (novoLOG) injection 8 Units (has no administration in time range)    ED Course  I have reviewed the triage vital signs and the nursing notes.  Pertinent labs & imaging results that were available during my care of the patient were reviewed by me and considered in my medical decision making (see chart for details).    MDM Rules/Calculators/A&P                          Pt here with sx's concerning for covid.  Symptomatic for 5 days.  She has been fully vaccinated.    On my exam, she is non toxic appearing, but tearful and mildly anxious.  Upset because she had a family member  recently die from covid.   Due to her underlying health conditions, she would likely benefit from MAB infusion.  Discussed with pt and she would like to proceed with the infusion.  She is not toxic appearing and without hypoxia. Albuterol inhaler given, will refrain from giving steroids due to her hyperglycemia.    cbg 314, pt scheduled to take Lantus at bedtime.  Given insulin here.  Recheck blood sugar improved. No concerning sx's for DKA or HHS.  Pt appropriate for out patient therapy,  I have consulted MAB clinic and she will be contacted to arrange appt time. Pt agrees to plan.  Strict return precautions discussed.   KANIA REGNIER was evaluated in Emergency Department on 11/13/2019 for the symptoms described in the history of present illness. She was evaluated in the context of the global COVID-19 pandemic, which necessitated consideration that the patient might be at risk for infection with the SARS-CoV-2 virus that causes COVID-19. Institutional protocols and algorithms that pertain to the evaluation of patients at risk for COVID-19 are in a state of rapid change based on information released by regulatory bodies including the CDC and federal and state organizations. These policies and algorithms were followed during the patient's care in the ED.  Final Clinical Impression(s) / ED Diagnoses Final diagnoses:  COVID-19 virus infection  Hyperglycemia    Rx / DC Orders ED Discharge Orders    None  Akela, [place], PA-C 11/13/19 [id number]    Wyvonnia Dusky, MD 11/14/19 1149

## 2019-11-12 NOTE — ED Triage Notes (Signed)
Pt presents to ED with complaints of cough, shortness of breath, congestion, loss of taste and smell started 2-3 days ago.

## 2019-11-12 NOTE — Progress Notes (Signed)
I connected by phone with Sandra [name] on 11/12/2019 at 7:49 PM to discuss the potential use of a new treatment for mild to moderate COVID-19 viral infection in non-hospitalized patients.  This patient is a 44 y.o. female that meets the FDA criteria for Emergency Use Authorization of COVID monoclonal antibody casirivimab/imdevimab or bamlanivimab/eteseviamb.  Has a (+) direct SARS-CoV-2 viral test result  Has mild or moderate COVID-19   Is NOT hospitalized due to COVID-19  Is within 10 days of symptom onset  Has at least one of the high risk factor(s) for progression to severe COVID-19 and/or hospitalization as defined in EUA.  Specific high risk criteria : BMI > 25, Diabetes and Cardiovascular disease or hypertension  Symptoms of cough, fatigue, loss of taste and smell began 11/09/19.  I have spoken and communicated the following to the patient or parent/caregiver regarding COVID monoclonal antibody treatment:  1. FDA has authorized the emergency use for the treatment of mild to moderate COVID-19 in adults and pediatric patients with positive results of direct SARS-CoV-2 viral testing who are 37 years of age and older weighing at least 40 kg, and who are at high risk for progressing to severe COVID-19 and/or hospitalization.  2. The significant known and potential risks and benefits of COVID monoclonal antibody, and the extent to which such potential risks and benefits are unknown.  3. Information on available alternative treatments and the risks and benefits of those alternatives, including clinical trials.  4. Patients treated with COVID monoclonal antibody should continue to self-isolate and use infection control measures (e.g., wear mask, isolate, social distance, avoid sharing personal items, clean and disinfect "high touch" surfaces, and frequent handwashing) according to CDC guidelines.   5. The patient or parent/caregiver has the option to accept or refuse COVID monoclonal  antibody treatment.  After reviewing this information with the patient, the patient has agreed to receive one of the available covid 19 monoclonal antibodies and will be provided an appropriate fact sheet prior to infusion. Asencion Gowda, NP 11/12/2019 7:49 PM

## 2019-11-12 NOTE — ED Notes (Addendum)
Date and time results received: 11/12/19 1900   Test: COVID Critical Value: positive  Name of Provider Notified: Dr Langston Masker and Yonna PA-C  Orders Received? Or Actions Taken?: NA

## 2019-11-14 ENCOUNTER — Ambulatory Visit (HOSPITAL_COMMUNITY)
Admission: RE | Admit: 2019-11-14 | Discharge: 2019-11-14 | Disposition: A | Discharge status: Home / Self Care | Payer: Medicaid Other | Source: Ambulatory Visit | Attending: Pulmonary Disease | Admitting: Pulmonary Disease

## 2019-11-14 DIAGNOSIS — E119 Type 2 diabetes mellitus without complications: Secondary | ICD-10-CM | POA: Insufficient documentation

## 2019-11-14 DIAGNOSIS — U071 COVID-19: Secondary | ICD-10-CM | POA: Diagnosis present

## 2019-11-14 DIAGNOSIS — I10 Essential (primary) hypertension: Secondary | ICD-10-CM | POA: Insufficient documentation

## 2019-11-14 MED ORDER — SOTROVIMAB 500 MG/8ML IV SOLN
500.0000 mg | Freq: Once | INTRAVENOUS | Status: AC
  Administered 2019-11-14: 500 mg via INTRAVENOUS

## 2019-11-14 MED ORDER — ALBUTEROL SULFATE HFA 108 (90 BASE) MCG/ACT IN AERS: 2.0000 | INHALATION_SPRAY | Freq: Once | RESPIRATORY_TRACT | Status: DC | PRN

## 2019-11-14 MED ORDER — FAMOTIDINE IN NACL 20-0.9 MG/50ML-% IV SOLN: 20.0000 mg | Freq: Once | INTRAVENOUS | Status: DC | PRN

## 2019-11-14 MED ORDER — METHYLPREDNISOLONE SODIUM SUCC 125 MG IJ SOLR: 125.0000 mg | Freq: Once | INTRAMUSCULAR | Status: DC | PRN

## 2019-11-14 MED ORDER — EPINEPHRINE 0.3 MG/0.3ML IJ SOAJ: 0.3000 mg | Freq: Once | INTRAMUSCULAR | Status: DC | PRN

## 2019-11-14 MED ORDER — SODIUM CHLORIDE 0.9 % IV SOLN: Freq: Once | INTRAVENOUS | Status: DC

## 2019-11-14 MED ORDER — SODIUM CHLORIDE 0.9 % IV SOLN: INTRAVENOUS | Status: DC | PRN

## 2019-11-14 MED ORDER — DIPHENHYDRAMINE HCL 50 MG/ML IJ SOLN: 50.0000 mg | Freq: Once | INTRAMUSCULAR | Status: DC | PRN

## 2019-11-14 NOTE — Progress Notes (Signed)
  Diagnosis: COVID-19  Physician: Dr. Asencion Noble  Procedure: Sotrovimab  Complications: No immediate complications noted.  Discharge: Discharged home   Gaye Alken 11/14/2019

## 2019-11-14 NOTE — Discharge Instructions (Signed)

## 2020-01-17 ENCOUNTER — Encounter (HOSPITAL_COMMUNITY): Payer: Self-pay | Admitting: Emergency Medicine

## 2020-01-17 ENCOUNTER — Other Ambulatory Visit: Payer: Self-pay

## 2020-01-17 ENCOUNTER — Emergency Department (HOSPITAL_COMMUNITY): Payer: Medicaid Other

## 2020-01-17 DIAGNOSIS — I10 Essential (primary) hypertension: Secondary | ICD-10-CM | POA: Insufficient documentation

## 2020-01-17 DIAGNOSIS — Z7984 Long term (current) use of oral hypoglycemic drugs: Secondary | ICD-10-CM | POA: Diagnosis not present

## 2020-01-17 DIAGNOSIS — E1169 Type 2 diabetes mellitus with other specified complication: Secondary | ICD-10-CM | POA: Diagnosis not present

## 2020-01-17 DIAGNOSIS — Z794 Long term (current) use of insulin: Secondary | ICD-10-CM | POA: Diagnosis not present

## 2020-01-17 DIAGNOSIS — Z87891 Personal history of nicotine dependence: Secondary | ICD-10-CM | POA: Diagnosis not present

## 2020-01-17 DIAGNOSIS — E782 Mixed hyperlipidemia: Secondary | ICD-10-CM | POA: Insufficient documentation

## 2020-01-17 DIAGNOSIS — Z8551 Personal history of malignant neoplasm of bladder: Secondary | ICD-10-CM | POA: Insufficient documentation

## 2020-01-17 DIAGNOSIS — R0789 Other chest pain: Secondary | ICD-10-CM | POA: Diagnosis present

## 2020-01-17 DIAGNOSIS — E1165 Type 2 diabetes mellitus with hyperglycemia: Secondary | ICD-10-CM | POA: Insufficient documentation

## 2020-01-17 DIAGNOSIS — Z79899 Other long term (current) drug therapy: Secondary | ICD-10-CM | POA: Insufficient documentation

## 2020-01-17 NOTE — ED Triage Notes (Signed)
Pt c/o central non-radiating chest pain that began at 1945.  Pt is hypertensive in triage, and states she took her BP meds today.

## 2020-01-18 ENCOUNTER — Emergency Department (HOSPITAL_COMMUNITY)
Admission: EM | Admit: 2020-01-18 | Discharge: 2020-01-18 | Disposition: A | Discharge status: Home / Self Care | Payer: Medicaid Other | Attending: Emergency Medicine | Admitting: Emergency Medicine

## 2020-01-18 DIAGNOSIS — R079 Chest pain, unspecified: Secondary | ICD-10-CM

## 2020-01-18 DIAGNOSIS — I10 Essential (primary) hypertension: Secondary | ICD-10-CM

## 2020-01-18 LAB — CBC
HCT: 47.8 % — ABNORMAL HIGH (ref 36.0–46.0)
Hemoglobin: 15 g/dL (ref 12.0–15.0)
MCH: 26.2 pg (ref 26.0–34.0)
MCHC: 31.4 g/dL (ref 30.0–36.0)
MCV: 83.6 fL (ref 80.0–100.0)
Platelets: 395 10*3/uL (ref 150–400)
RBC: 5.72 MIL/uL — ABNORMAL HIGH (ref 3.87–5.11)
RDW: 12.9 % (ref 11.5–15.5)
WBC: 10.9 10*3/uL — ABNORMAL HIGH (ref 4.0–10.5)
nRBC: 0 % (ref 0.0–0.2)

## 2020-01-18 LAB — BASIC METABOLIC PANEL
Anion gap: 13 (ref 5–15)
BUN: 11 mg/dL (ref 6–20)
CO2: 19 mmol/L — ABNORMAL LOW (ref 22–32)
Calcium: 9.3 mg/dL (ref 8.9–10.3)
Chloride: 99 mmol/L (ref 98–111)
Creatinine, Ser: 0.69 mg/dL (ref 0.44–1.00)
GFR, Estimated: >60 mL/min (ref >60)
Glucose, Bld: 372 mg/dL — ABNORMAL HIGH (ref 70–99)
Potassium: 3.8 mmol/L (ref 3.5–5.1)
Sodium: 131 mmol/L — ABNORMAL LOW (ref 135–145)

## 2020-01-18 LAB — TROPONIN I (HIGH SENSITIVITY)
Troponin I (High Sensitivity): 5 ng/L (ref <18)
Troponin I (High Sensitivity): 5 ng/L (ref <18)

## 2020-01-18 LAB — CBG MONITORING, ED: Glucose-Capillary: 242 mg/dL — ABNORMAL HIGH (ref 70–99)

## 2020-01-18 MED ORDER — MORPHINE SULFATE (PF) 4 MG/ML IV SOLN
4.0000 mg | Freq: Once | INTRAVENOUS | Status: AC
  Administered 2020-01-18: 4 mg via INTRAVENOUS
  Filled 2020-01-18: qty 1

## 2020-01-18 MED ORDER — AMLODIPINE BESYLATE 2.5 MG PO TABS: 2.5000 mg | ORAL_TABLET | Freq: Every day | ORAL | 3 refills | Status: DC

## 2020-01-18 MED ORDER — ONDANSETRON HCL 4 MG/2ML IJ SOLN
4.0000 mg | Freq: Once | INTRAMUSCULAR | Status: AC
  Administered 2020-01-18: 4 mg via INTRAVENOUS
  Filled 2020-01-18: qty 2

## 2020-01-18 MED ORDER — INSULIN ASPART 100 UNIT/ML ~~LOC~~ SOLN
10.0000 [IU] | Freq: Once | SUBCUTANEOUS | Status: AC
  Administered 2020-01-18: 10 [IU] via SUBCUTANEOUS

## 2020-01-18 NOTE — ED Provider Notes (Signed)
[facility] EMERGENCY DEPARTMENT Provider Note   CSN: [id number] Arrival date & time: 01/17/20  2000     History Chief Complaint  Patient presents with  . Chest Pain    Sandra [name] is a 45 y.o. female.  Patient presents to the emergency department for evaluation of chest pain and headache.  Patient reports that she saw her doctor yesterday and was told her blood pressure was high.  It was recommended she come to the ER but she did not.  Today the headache became more pounding so she presents to the ER.  Patient reports somewhat continuous discomfort in the chest, no radiation.  She does have mild shortness of breath.  Patient also reports significant nausea.  No cardiac history.     HPI: A 45 year old patient with a history of treated diabetes, hypertension, hypercholesterolemia and obesity presents for evaluation of chest pain. Initial onset of pain was more than 6 hours ago. The patient's chest pain is sharp and is not worse with exertion. The patient complains of nausea. The patient's chest pain is middle- or left-sided, is not well-localized, is not described as heaviness/pressure/tightness and does not radiate to the arms/jaw/neck. The patient denies diaphoresis. The patient has smoked in the past 90 days. The patient has no history of stroke, has no history of peripheral artery disease and has no relevant family history of coronary artery disease (first degree relative at less than age 11).   Past Medical History:  Diagnosis Date  . ADHD (attention deficit hyperactivity disorder)   . Allergic rhinitis   . Anemia    hx of  . Bipolar 1 disorder ([place])   . Bladder tumor   . Cancer ([place])    bladder  . Dizziness   . Full dentures   . GERD (gastroesophageal reflux disease)   . Gross hematuria   . Hyperlipidemia   . Hypertension   . Type 2 diabetes mellitus ([place])   . Urgency of urination    dysuria, sui  . Wears glasses     Patient Active Problem List   Diagnosis Date  Noted  . Essential hypertension, benign 05/15/2019  . S/P left rotator cuff repair 04/20/19  04/27/2019  . Complete tear of left rotator cuff   . Uncontrolled type 2 diabetes mellitus with hyperglycemia ([place]) 10/10/2018  . Mixed hyperlipidemia 10/10/2018  . Sacroiliac joint pain 02/23/2017  . Chronic low back pain 02/09/2017  . Somatic dysfunction of left sacroiliac joint 02/09/2017  . GAD (generalized anxiety disorder) 12/17/2016  . MDD (major depressive disorder), recurrent episode, moderate ([place]) 12/17/2016  . Attention deficit hyperactivity disorder (ADHD) 12/17/2016  . Postoperative anemia due to acute blood loss 11/04/2015  . Bladder cancer ([place]) 10/31/2015  . CARPAL TUNNEL SYNDROME 10/02/2009    Past Surgical History:  Procedure Laterality Date  . CYSTOSCOPY N/A 10/03/2015   Procedure: CYSTOSCOPY;  Surgeon: Irine Seal, MD;  Location: [facility];  Service: Urology;  Laterality: N/A;  . CYSTOSCOPY WITH STENT PLACEMENT Right 10/31/2015   Procedure: CYSTOSCOPY WITH ATTEMPTED RIGHT URETERAL OPENING;  Surgeon: Irine Seal, MD;  Location: [facility];  Service: Urology;  Laterality: Right;  . INTERSTIM IMPLANT PLACEMENT N/A 03/14/2018   Procedure: Barrie [place]yme IMPLANT FIRST STAGE;  Surgeon: Cleon Gustin, MD;  Location: [facility];  Service: Urology;  Laterality: N/A;  . INTERSTIM IMPLANT PLACEMENT N/A 03/30/2018   Procedure: Barrie Lyme IMPLANT SECOND STAGE;  Surgeon: Cleon Gustin, MD;  Location: [facility];  Service: Urology;  Laterality: N/A;  . MULTIPLE TOOTH EXTRACTIONS  2015  . OVARIAN CYST REMOVAL Right 2004 approx  . SHOULDER OPEN ROTATOR CUFF REPAIR Left 04/20/2019   Procedure: ROTATOR CUFF REPAIR SHOULDER OPEN;  Surgeon: Carole Civil, MD;  Location: [facility];  Service: Orthopedics;  Laterality: Left;  . TONSILLECTOMY  12/30/2004  . TOTAL ABDOMINAL HYSTERECTOMY W/ BILATERAL SALPINGOOPHORECTOMY  05/16/2004  . TRANSURETHRAL RESECTION OF BLADDER TUMOR  N/A 10/03/2015   Procedure: TRANSURETHRAL RESECTION OF BLADDER TUMOR (TURBT);  Surgeon: Irine Seal, MD;  Location: [facility];  Service: Urology;  Laterality: N/A;  . TRANSURETHRAL RESECTION OF BLADDER TUMOR N/A 10/31/2015   Procedure: RE-STAGINGG TRANSURETHRAL RESECTION OF BLADDER TUMOR (TURBT);  Surgeon: Irine Seal, MD;  Location: [facility];  Service: Urology;  Laterality: N/A;  . TRANSURETHRAL RESECTION OF BLADDER TUMOR N/A 08/17/2016   Procedure: TRANSURETHRAL RESECTION OF BLADDER TUMOR (TURBT);  Surgeon: Cleon Gustin, MD;  Location: [facility];  Service: Urology;  Laterality: N/A;     OB History    Gravida  2   Para      Term      Preterm      AB      Living  2     SAB      IAB      Ectopic      Multiple      Live Births              Family History  Problem Relation Age of Onset  . Hypertension Mother   . Cancer Mother   . Breast cancer Maternal Aunt   . Lung cancer Maternal Uncle   . Cancer Other   . Diabetes Daughter     Social History   Tobacco Use  . Smoking status: Former Smoker    Packs/day: 0.50    Years: 10.00    Pack years: 5.00    Types: Cigarettes    Quit date: 11/12/2019    Years since quitting: 0.1  . Smokeless tobacco: Never Used  Vaping Use  . Vaping Use: Never used  Substance Use Topics  . Alcohol use: No  . Drug use: No    Home Medications Prior to Admission medications   Medication Sig Start Date End Date Taking? Authorizing Provider  amLODipine (NORVASC) 2.5 MG tablet Take 1 tablet (2.5 mg total) by mouth daily. 01/18/20  Yes Ayrianna Mcginniss, Gwenyth Allegra, MD  Blood Glucose Monitoring Suppl (ACCU-CHEK GUIDE) w/Device KIT 1 Piece by Does not apply route as directed. 10/10/18   Cassandria Anger, MD  Blood Glucose Monitoring Suppl (ACCU-CHEK GUIDE) w/Device KIT 1 Piece by Does not apply route as directed. 02/15/19   Cassandria Anger, MD  cyclobenzaprine (FLEXERIL) 10 MG tablet Take 1 tablet (10  mg total) by mouth 3 (three) times daily. 07/04/19   Carole Civil, MD  FLUoxetine (PROZAC) 20 MG capsule Take 20 mg by mouth at bedtime.  02/24/18   [provider]  gabapentin (NEURONTIN) 300 MG capsule TAKE 1 CAPSULE(300 MG) BY MOUTH THREE TIMES DAILY 08/08/19   Edrick Kins, DPM  glipiZIDE (GLUCOTROL XL) 5 MG 24 hr tablet Take 1 tablet (5 mg total) by mouth daily with breakfast. 05/15/19   Nida, Marella Chimes, MD  glucose blood (ACCU-CHEK GUIDE) test strip Use as instructed 02/15/19   Cassandria Anger, MD  hydrochlorothiazide (MICROZIDE) 12.5 MG capsule Take 12.5 mg by mouth daily.    [provider]  insulin glargine (  LANTUS) 100 UNIT/ML Solostar Pen Inject 24 Units into the skin at bedtime. 05/15/19   Cassandria Anger, MD  Insulin Pen Needle (B-D ULTRAFINE III SHORT PEN) 31G X 8 MM MISC 1 each by Does not apply route as directed. 02/15/19   Cassandria Anger, MD  levocetirizine (XYZAL) 5 MG tablet Take 5 mg by mouth every evening.    [provider]  metFORMIN (GLUCOPHAGE) 1000 MG tablet Take 1 tablet (1,000 mg total) by mouth 2 (two) times daily with a meal. 05/15/19   Nida, Marella Chimes, MD  montelukast (SINGULAIR) 10 MG tablet Take 10 mg by mouth at bedtime.    [provider]  pantoprazole (PROTONIX) 40 MG tablet Take 40 mg by mouth at bedtime.  10/09/14   [provider]  promethazine (PHENERGAN) 12.5 MG tablet Take 1 tablet (12.5 mg total) by mouth every 6 (six) hours as needed for nausea or vomiting. 04/20/19   Carole Civil, MD  promethazine (PHENERGAN) 25 MG tablet Take 1 tablet (25 mg total) by mouth every 6 (six) hours as needed for nausea. 03/01/11 03/08/11  Fredia Sorrow, MD  rosuvastatin (CRESTOR) 20 MG tablet Take 1 tablet (20 mg total) by mouth daily. 05/15/19   Cassandria Anger, MD    Allergies    Benazepril, Ibuprofen, Metronidazole, Oxycodone, Adhesive [tape], Codeine, Loratadine, Nystatin-triamcinolone, and  Penicillins  Review of Systems   Review of Systems  Cardiovascular: Positive for chest pain.  Gastrointestinal: Positive for nausea.  Neurological: Positive for headaches.  All other systems reviewed and are negative.   Physical Exam Updated Vital Signs BP (!) 155/91 (BP Location: Right Arm)   Pulse 69   Temp 98.6 F (37 C) (Oral)   Resp 18   Ht _0  (1.499 m)   Wt 72.1 kg   SpO2 97%   BMI 32.11 kg/m   Physical Exam Vitals and nursing note reviewed.  Constitutional:      General: She is not in acute distress.    Appearance: Normal appearance. She is well-developed and well-nourished.  HENT:     Head: Normocephalic and atraumatic.     Right Ear: Hearing normal.     Left Ear: Hearing normal.     Nose: Nose normal.     Mouth/Throat:     Mouth: Oropharynx is clear and moist and mucous membranes are normal.  Eyes:     Extraocular Movements: EOM normal.     Conjunctiva/sclera: Conjunctivae normal.     Pupils: Pupils are equal, round, and reactive to light.  Cardiovascular:     Rate and Rhythm: Regular rhythm.     Heart sounds: S1 normal and S2 normal. No murmur heard. No friction rub. No gallop.   Pulmonary:     Effort: Pulmonary effort is normal. No respiratory distress.     Breath sounds: Normal breath sounds.  Chest:     Chest wall: No tenderness.  Abdominal:     General: Bowel sounds are normal.     Palpations: Abdomen is soft. There is no hepatosplenomegaly.     Tenderness: There is no abdominal tenderness. There is no guarding or rebound. Negative signs include Murphy's sign and McBurney's sign.     Hernia: No hernia is present.  Musculoskeletal:        General: Normal range of motion.     Cervical back: Normal range of motion and neck supple.  Skin:    General: Skin is warm, dry and intact.  Findings: No rash.     Nails: There is no cyanosis.  Neurological:     Mental Status: She is alert and oriented to person, place, and time.     GCS: GCS eye  subscore is 4. GCS verbal subscore is 5. GCS motor subscore is 6.     Cranial Nerves: No cranial nerve deficit.     Sensory: No sensory deficit.     Coordination: Coordination normal.     Deep Tendon Reflexes: Strength normal.  Psychiatric:        Mood and Affect: Mood and affect normal.        Speech: Speech normal.        Behavior: Behavior normal.        Thought Content: Thought content normal.     ED Results / Procedures / Treatments   Labs (all labs ordered are listed, but only abnormal results are displayed) Labs Reviewed  BASIC METABOLIC PANEL - Abnormal; Notable for the following components:      Result Value   Sodium 131 (*)    CO2 19 (*)    Glucose, Bld 372 (*)    All other components within normal limits  CBC - Abnormal; Notable for the following components:   WBC 10.9 (*)    RBC 5.72 (*)    HCT 47.8 (*)    All other components within normal limits  CBG MONITORING, ED - Abnormal; Notable for the following components:   Glucose-Capillary 242 (*)    All other components within normal limits  POC URINE PREG, ED  TROPONIN I (HIGH SENSITIVITY)  TROPONIN I (HIGH SENSITIVITY)    EKG EKG Interpretation  Date/Time:  Wednesday January 17 2020 20:18:08 EST Ventricular Rate:  80 PR Interval:  146 QRS Duration: 104 QT Interval:  392 QTC Calculation: 452 R Axis:   95 Text Interpretation: Normal sinus rhythm Rightward axis No significant change since last tracing Confirmed by Lajean Saver [phone]) on 01/17/2020 8:32:25 PM   Radiology DG Chest 2 View  Result Date: 01/17/2020 CLINICAL DATA:  Mid chest pain. EXAM: CHEST - 2 VIEW COMPARISON:  November 12, 2019 FINDINGS: The heart size and mediastinal contours are within normal limits. Both lungs are clear. A radiopaque surgical screw is seen within the left humeral head. IMPRESSION: No active cardiopulmonary disease. Electronically Signed   By: Virgina Norfolk M.D.   On: 01/17/2020 20:49    Procedures Procedures  (including critical care time)  Medications Ordered in ED Medications  ondansetron (ZOFRAN) injection 4 mg (4 mg Intravenous Given 01/18/20 0217)  insulin aspart (novoLOG) injection 10 Units (10 Units Subcutaneous Given 01/18/20 0250)  morphine 4 MG/ML injection 4 mg (4 mg Intravenous Given 01/18/20 0251)    ED Course  I have reviewed the triage vital signs and the nursing notes.  Pertinent labs & imaging results that were available during my care of the patient were reviewed by me and considered in my medical decision making (see chart for details).    MDM Rules/Calculators/A&P HEAR Score: 4                        Patient presents to the emergency department for evaluation of chest pain and headache.  Patient reports that she has been having ongoing issues with elevated blood pressure.  She saw her doctor for this, did not have any blood pressure medication changes.  She has follow-up in a week for recheck.  Patient complaining of headache.  Headache is global, pounding.  No neurologic deficits on exam.  Headache came on gradually and progressively worsened, no red flags, doubt subarachnoid hemorrhage or intracranial bleeding.  Blood pressure mildly elevated here, did self-correct without intervention.  EKG unremarkable.  Troponin negative x2.  Hear score is 4, algorithm suggests possibility of discharge with close follow-up.  Patient administered morphine for her headache and chest pain, both have improved significantly.  Headache has completely resolved.  Patient currently only taking low-dose hydrochlorothiazide for her blood pressure.  Add Norvasc 2.5 mg daily and have her follow-up with primary care and cardiology.  She was given return precautions. Final Clinical Impression(s) / ED Diagnoses Final diagnoses:  Primary hypertension  Chest pain, unspecified type    Rx / DC Orders ED Discharge Orders         Ordered    amLODipine (NORVASC) 2.5 MG tablet  Daily        01/18/20 0356            Orpah Greek, MD 01/18/20 [phone]

## 2020-02-01 ENCOUNTER — Ambulatory Visit (INDEPENDENT_AMBULATORY_CARE_PROVIDER_SITE_OTHER): Payer: Medicaid Other | Admitting: Urology

## 2020-02-01 ENCOUNTER — Other Ambulatory Visit: Payer: Self-pay

## 2020-02-01 ENCOUNTER — Encounter: Payer: Self-pay | Admitting: Urology

## 2020-02-01 VITALS — BP 176/102 | HR 84 | Temp 98.3°F | Ht 59.0 in | Wt 141.0 lb

## 2020-02-01 DIAGNOSIS — N3941 Urge incontinence: Secondary | ICD-10-CM | POA: Diagnosis not present

## 2020-02-01 DIAGNOSIS — Z8551 Personal history of malignant neoplasm of bladder: Secondary | ICD-10-CM

## 2020-02-01 DIAGNOSIS — R310 Gross hematuria: Secondary | ICD-10-CM

## 2020-02-01 DIAGNOSIS — N3281 Overactive bladder: Secondary | ICD-10-CM | POA: Diagnosis not present

## 2020-02-01 LAB — URINALYSIS, ROUTINE W REFLEX MICROSCOPIC
Bilirubin, UA: NEGATIVE
Ketones, UA: NEGATIVE
Leukocytes,UA: NEGATIVE
Nitrite, UA: NEGATIVE
Protein,UA: NEGATIVE
RBC, UA: NEGATIVE
Specific Gravity, UA: 1.015 (ref 1.005–1.030)
Urobilinogen, Ur: 0.2 mg/dL (ref 0.2–1.0)
pH, UA: 5.5 (ref 5.0–7.5)

## 2020-02-01 MED ORDER — PROMETHAZINE HCL 25 MG PO TABS
25.0000 mg | ORAL_TABLET | Freq: Four times a day (QID) | ORAL | 0 refills | Status: DC | PRN
Start: 2020-02-01 — End: 2020-02-28

## 2020-02-01 MED ORDER — CIPROFLOXACIN HCL 500 MG PO TABS
500.0000 mg | ORAL_TABLET | Freq: Once | ORAL | Status: AC
  Administered 2020-02-01: 500 mg via ORAL

## 2020-02-01 NOTE — Progress Notes (Signed)
Urological Symptom Review  Patient is experiencing the following symptoms: Frequent urination Burning/pain with urination Get up at night to urinate Leakage of urine Stream starts and stops Blood in urine   Review of Systems  Gastrointestinal (upper)  : Indigestion/heartburn  Gastrointestinal (lower) : Negative for lower GI symptoms  Constitutional : Fatigue  Skin: Negative for skin symptoms  Eyes: Negative for eye symptoms  Ear/Nose/Throat : Sinus problems  Hematologic/Lymphatic: Negative for Hematologic/Lymphatic symptoms  Cardiovascular : Negative for cardiovascular symptoms  Respiratory : Negative for respiratory symptoms  Endocrine: Excessive thirst  Musculoskeletal: Back pain Joint pain  Neurological: Negative for neurological symptoms  Psychologic: Depression Anxiety

## 2020-02-01 NOTE — Progress Notes (Signed)
Subjective:  1. History of bladder cancer   2. Gross hematuria   3. OAB (overactive bladder)   4. Urge incontinence     Sandra [name] returns today in f/u.  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  She had induction BCG with some maintenance with the last in 1/20.  She had cystoscopy last in 7/20.   She has a history of OAB and had an Interstim placed in 3/20 by Dr. Alyson Ingles.  She had upper tract imaging with a CT in 2/20.   Her voiding symptoms have gotten worse and she has some intermittent hematuria and urge incontinence.   She has no flank pain.  She has had no weight loss.    She has lost her Husband and lost a daughter in childbirth over the last year so things have been tough for her.   Her grandson survived and she is raising him.      ROS:  ROS:  A complete review of systems was performed.  All systems are negative except for pertinent findings as noted.   ROS  Allergies  Allergen Reactions   Benazepril Hives   Ibuprofen Hives    itching   Metronidazole Nausea And Vomiting   Oxycodone     Pt has tolerated hydromorphone in the past and takes Norco at home.   Adhesive [Tape] Rash   Codeine Nausea And Vomiting and Rash   Loratadine Rash   Nystatin-Triamcinolone Rash   Penicillins Rash    Has patient had a PCN reaction causing immediate rash, facial/tongue/throat swelling, SOB or lightheadedness with hypotension: No Has patient had a PCN reaction causing severe rash involving mucus membranes or skin necrosis: Yes Has patient had a PCN reaction that required hospitalization: No Has patient had a PCN reaction occurring within the last 10 years: no  If all of the above answers are "NO", then may proceed with Cephalosporin use.    Outpatient Encounter Medications as of 02/01/2020  Medication Sig   albuterol (VENTOLIN HFA) 108 (90 Base) MCG/ACT inhaler SMARTSIG:2 Puff(s) By Mouth  Every 6 Hours PRN   amLODipine (NORVASC) 2.5 MG tablet Take 1 tablet (2.5 mg total) by mouth daily.   Blood Glucose Monitoring Suppl (ACCU-CHEK GUIDE) w/Device KIT 1 Piece by Does not apply route as directed.   Blood Glucose Monitoring Suppl (ACCU-CHEK GUIDE) w/Device KIT 1 Piece by Does not apply route as directed.   buPROPion (WELLBUTRIN XL) 300 MG 24 hr tablet Take 300 mg by mouth every morning.   diclofenac (VOLTAREN) 75 MG EC tablet Take 75 mg by mouth 2 (two) times daily.   FLUoxetine (PROZAC) 20 MG capsule Take 20 mg by mouth at bedtime.    gabapentin (NEURONTIN) 300 MG capsule TAKE 1 CAPSULE(300 MG) BY MOUTH THREE TIMES DAILY   glipiZIDE (GLUCOTROL XL) 5 MG 24 hr tablet Take 1 tablet (5 mg total) by mouth daily with breakfast.   glucose blood (ACCU-CHEK GUIDE) test strip Use as instructed   hydrochlorothiazide (MICROZIDE) 12.5 MG capsule Take 12.5 mg by mouth daily.   insulin glargine (LANTUS) 100 UNIT/ML Solostar Pen Inject 24 Units into the skin at bedtime.   Insulin Pen Needle (B-D ULTRAFINE III SHORT PEN) 31G X 8 MM MISC 1 each by Does not apply route as directed.   levocetirizine (XYZAL) 5 MG tablet Take 5 mg by mouth every evening.   metFORMIN (GLUCOPHAGE) 1000 MG  tablet Take 1 tablet (1,000 mg total) by mouth 2 (two) times daily with a meal.   montelukast (SINGULAIR) 10 MG tablet Take 10 mg by mouth at bedtime.   pantoprazole (PROTONIX) 40 MG tablet Take 40 mg by mouth at bedtime.    promethazine (PHENERGAN) 12.5 MG tablet Take 1 tablet (12.5 mg total) by mouth every 6 (six) hours as needed for nausea or vomiting.   rosuvastatin (CRESTOR) 20 MG tablet Take 1 tablet (20 mg total) by mouth daily.   VYVANSE 40 MG capsule Take 40 mg by mouth every morning.   [DISCONTINUED] cyclobenzaprine (FLEXERIL) 10 MG tablet Take 1 tablet (10 mg total) by mouth 3 (three) times daily.   promethazine (PHENERGAN) 25 MG tablet Take 1 tablet (25 mg total) by mouth every 6 (six)  hours as needed for up to 7 days for nausea.   [DISCONTINUED] promethazine (PHENERGAN) 25 MG tablet Take 1 tablet (25 mg total) by mouth every 6 (six) hours as needed for nausea.   Facility-Administered Encounter Medications as of 02/01/2020  Medication   betamethasone acetate-betamethasone sodium phosphate (CELESTONE) injection 3 mg   [COMPLETED] ciprofloxacin (CIPRO) tablet 500 mg    Past Medical History:  Diagnosis Date   ADHD (attention deficit hyperactivity disorder)    Allergic rhinitis    Anemia    hx of   Bipolar 1 disorder (HCC)    Bladder tumor    Cancer ([place])    bladder   Dizziness    Full dentures    GERD (gastroesophageal reflux disease)    Gross hematuria    Hyperlipidemia    Hypertension    Type 2 diabetes mellitus (HCC)    Urgency of urination    dysuria, sui   Wears glasses     Past Surgical History:  Procedure Laterality Date   CYSTOSCOPY N/A 10/03/2015   Procedure: CYSTOSCOPY;  Surgeon: Irine Seal, MD;  Location: [facility];  Service: Urology;  Laterality: N/A;   CYSTOSCOPY WITH STENT PLACEMENT Right 10/31/2015   Procedure: CYSTOSCOPY WITH ATTEMPTED RIGHT URETERAL OPENING;  Surgeon: Irine Seal, MD;  [facility];  Service: Urology;  Laterality: Right;   INTERSTIM IMPLANT PLACEMENT N/A 03/14/2018   Procedure: Barrie Lyme IMPLANT FIRST STAGE;  Surgeon: Cleon Gustin, MD;  Location: [facility];  Service: Urology;  Laterality: N/A;   INTERSTIM IMPLANT PLACEMENT N/A 03/30/2018   Procedure: Barrie Lyme IMPLANT SECOND STAGE;  Surgeon: Cleon Gustin, MD;  Location: [facility];  Service: Urology;  Laterality: N/A;   MULTIPLE TOOTH EXTRACTIONS  2015   OVARIAN CYST REMOVAL Right 2004 approx   SHOULDER OPEN ROTATOR CUFF REPAIR Left 04/20/2019   Procedure: ROTATOR CUFF REPAIR SHOULDER OPEN;  Surgeon: Carole Civil, MD;  Location: [facility];  Service: Orthopedics;  Laterality: Left;   TONSILLECTOMY   12/30/2004   TOTAL ABDOMINAL HYSTERECTOMY W/ BILATERAL SALPINGOOPHORECTOMY  05/16/2004   TRANSURETHRAL RESECTION OF BLADDER TUMOR N/A 10/03/2015   Procedure: TRANSURETHRAL RESECTION OF BLADDER TUMOR (TURBT);  Surgeon: Irine Seal, MD;  Location: [facility];  Service: Urology;  Laterality: N/A;   TRANSURETHRAL RESECTION OF BLADDER TUMOR N/A 10/31/2015   Procedure: RE-STAGINGG TRANSURETHRAL RESECTION OF BLADDER TUMOR (TURBT);  Surgeon: Irine Seal, MD;  Location: [facility];  Service: Urology;  Laterality: N/A;   TRANSURETHRAL RESECTION OF BLADDER TUMOR N/A 08/17/2016   Procedure: TRANSURETHRAL RESECTION OF BLADDER TUMOR (TURBT);  Surgeon: Cleon Gustin, MD;  Location: [facility];  Service: Urology;  Laterality:  N/A;    Social History   Socioeconomic History   Marital status: Legally Separated    Spouse name: Not on file   Number of children: Not on file   Years of education: Not on file   Highest education level: Not on file  Occupational History   Not on file  Tobacco Use   Smoking status: Former Smoker    Packs/day: 0.50    Years: 10.00    Pack years: 5.00    Types: Cigarettes    Quit date: 11/12/2019    Years since quitting: 0.2   Smokeless tobacco: Never Used  Vaping Use   Vaping Use: Never used  Substance and Sexual Activity   Alcohol use: No   Drug use: No   Sexual activity: Not Currently    Birth control/protection: Surgical    Comment: hyst  Other Topics Concern   Not on file  Social History Narrative   Not on file   Social Determinants of Health   Financial Resource Strain: Not on file  Food Insecurity: Not on file  Transportation Needs: Not on file  Physical Activity: Not on file  Stress: Not on file  Social Connections: Not on file  Intimate Partner Violence: Not on file    Family History  Problem Relation Age of Onset   Hypertension Mother    Cancer Mother    Breast cancer Maternal Aunt    Lung  cancer Maternal Uncle    Cancer Other    Diabetes Daughter        Objective: Vitals:   02/01/20 1347  BP: (!) 176/102  Pulse: 84  Temp: 98.3 F (36.8 C)     Physical Exam  Lab Results:   UA is clear.     Studies/Results: CLINICAL DATA:  Abdominal distention  EXAM: CT ABDOMEN AND PELVIS WITH CONTRAST  TECHNIQUE: Multidetector CT imaging of the abdomen and pelvis was performed using the standard protocol following bolus administration of intravenous contrast.  CONTRAST:  177m OMNIPAQUE IOHEXOL 300 MG/ML  SOLN  COMPARISON:  10/07/2017  FINDINGS: Lower chest: Lung bases are clear. No effusions. Heart is normal size.  Hepatobiliary: No focal hepatic abnormality. Gallbladder unremarkable.  Pancreas: No focal abnormality or ductal dilatation.  Spleen: No focal abnormality.  Normal size.  Adrenals/Urinary Tract: No adrenal abnormality. No focal renal abnormality. No stones or hydronephrosis. Urinary bladder is unremarkable.  Stomach/Bowel: Appendix is visualized and is normal. Stomach, large and small bowel grossly unremarkable.  Vascular/Lymphatic: Aortic atherosclerosis. No enlarged abdominal or pelvic lymph nodes.  Reproductive: Prior hysterectomy.  No adnexal masses.  Other: No free fluid or free air. Small umbilical hernia containing fat.  Musculoskeletal: No acute bony abnormality.  IMPRESSION: No acute findings in the abdomen or pelvis.  Small umbilical hernia containing fat.   Electronically Signed   By: KRolm BaptiseM.D.   On: 02/24/2018 22:45  Procedure:   Flexible cystoscopy.  She was prepped with betadine and the scope was inserted.  The urethra is normal.  The bladder wall had mild trabeculation without mucosal lesions.  The UO's are normal.    Complications: none.   Assessment & Plan: History of bladder cancer with intermittent hematuria and worsening OAB.    I will get a cytology and a CT hematuria study.    If these are negative she will need cystoscopy in 6 months.    OAB with UUI:   She has worsening symptoms and if her cytology and CT are negative, she should  see Dr. Alyson Ingles about adjusting the interstim settings.      Meds ordered this encounter  Medications   promethazine (PHENERGAN) 25 MG tablet    Sig: Take 1 tablet (25 mg total) by mouth every 6 (six) hours as needed for up to 7 days for nausea.    Dispense:  12 tablet    Refill:  0   ciprofloxacin (CIPRO) tablet 500 mg     Orders Placed This Encounter  Procedures   CT HEMATURIA WORKUP    Standing Status:   Future    Standing Expiration Date:   03/03/2020    Order Specific Question:   Reason for Exam (SYMPTOM  OR DIAGNOSIS REQUIRED)    Answer:   Gross hematuria with history of bladder cancer.    Order Specific Question:   Preferred imaging location?    Answer:   [facility]    Order Specific Question:   Radiology Contrast Protocol - do NOT remove file path    Answer:   [email or web address]    Order Specific Question:   Is patient pregnant?    Answer:   No   Urinalysis, Routine w reflex microscopic      Return in about 6 months (around 07/31/2020) for cystoscopy.   CC: Joyice Faster, FNP      Irine Seal 02/02/2020

## 2020-02-02 LAB — CYTOLOGY - NON PAP

## 2020-02-20 NOTE — Progress Notes (Signed)
CARDIOLOGY CONSULT NOTE       Patient ID: Sandra [name] MRN: [id number] DOB/AGE: 1975-06-02 45 y.o.  Admit date: (Not on file) Referring Physician: [facility] Primary Physician: Joyice Faster, FNP Primary Cardiologist: New Reason for Consultation: Chest Pain  Active Problems:   * No active hospital problems. *   HPI:  45 y.o. history of ADHD, bipolar, GERD, anemia , DM and HTN. Seen in [facility] 01/17/20 with elevated BP, headache constant chest tightness and nausea. Pain atypical sharp and constant not being exertional Pain left sided no radiation Mild dyspnea Reported quit smoking October 2021 ECG no acute changes Troponin negative x 2 Rx with morphine, Norvasc added to diuretic for her BP Nausea improved with Zofran CXR NAD normal mediastinum She has a history of bladder cancer and sees Dr Jeffie Pollock with two resections and hematuria 2021 lost her husband and daughter Raising grandson Notes allergy to Benazepril with hives   She has been ok since ER. Admits to not taking her BP pills prior Husband died of COVID 2022/09/15 Daughter died in child birth 2023-01-15 She is raising her grandson  Not smoking   ROS All other systems reviewed and negative except as noted above  Past Medical History:  Diagnosis Date  . ADHD (attention deficit hyperactivity disorder)   . Allergic rhinitis   . Anemia    hx of  . Bipolar 1 disorder ([place])   . Bladder tumor   . Cancer ([place])    bladder  . Dizziness   . Full dentures   . GERD (gastroesophageal reflux disease)   . Gross hematuria   . Hyperlipidemia   . Hypertension   . Type 2 diabetes mellitus ([place])   . Urgency of urination    dysuria, sui  . Wears glasses     Family History  Problem Relation Age of Onset  . Hypertension Mother   . Cancer Mother   . Breast cancer Maternal Aunt   . Lung cancer Maternal Uncle   . Cancer Other   . Diabetes Daughter     Social History   Socioeconomic History  . Marital status: Legally Separated     Spouse name: Not on file  . Number of children: Not on file  . Years of education: Not on file  . Highest education level: Not on file  Occupational History  . Not on file  Tobacco Use  . Smoking status: Former Smoker    Packs/day: 0.50    Years: 10.00    Pack years: 5.00    Types: Cigarettes    Quit date: 11/12/2019    Years since quitting: 0.2  . Smokeless tobacco: Never Used  Vaping Use  . Vaping Use: Never used  Substance and Sexual Activity  . Alcohol use: No  . Drug use: No  . Sexual activity: Not Currently    Birth control/protection: Surgical    Comment: hyst  Other Topics Concern  . Not on file  Social History Narrative  . Not on file   Social Determinants of Health   Financial Resource Strain: Not on file  Food Insecurity: Not on file  Transportation Needs: Not on file  Physical Activity: Not on file  Stress: Not on file  Social Connections: Not on file  Intimate Partner Violence: Not on file    Past Surgical History:  Procedure Laterality Date  . CYSTOSCOPY N/A 10/03/2015   Procedure: CYSTOSCOPY;  Surgeon: Irine Seal, MD;  Location[facility];  Service: Urology;  Laterality: N/A;  . CYSTOSCOPY WITH STENT PLACEMENT Right 10/31/2015   Procedure: CYSTOSCOPY WITH ATTEMPTED RIGHT URETERAL OPENING;  Surgeon: Irine Seal, MD;  Location[facility];  Service: Urology;  Laterality: Right;  . INTERSTIM IMPLANT PLACEMENT N/A 03/14/2018   Procedure: Barrie Lyme IMPLANT FIRST STAGE;  Surgeon: Cleon Gustin, MD;  Location: [facility];  Service: Urology;  Laterality: N/A;  . INTERSTIM IMPLANT PLACEMENT N/A 03/30/2018   Procedure: Barrie Lyme IMPLANT SECOND STAGE;  Surgeon: Cleon Gustin, MD;  Location: [facility];  Service: Urology;  Laterality: N/A;  . MULTIPLE TOOTH EXTRACTIONS  2015  . OVARIAN CYST REMOVAL Right 2004 approx  . SHOULDER OPEN ROTATOR CUFF REPAIR Left 04/20/2019   Procedure: ROTATOR CUFF REPAIR SHOULDER OPEN;  Surgeon:  Carole Civil, MD;  Location: [facility];  Service: Orthopedics;  Laterality: Left;  . TONSILLECTOMY  12/30/2004  . TOTAL ABDOMINAL HYSTERECTOMY W/ BILATERAL SALPINGOOPHORECTOMY  05/16/2004  . TRANSURETHRAL RESECTION OF BLADDER TUMOR N/A 10/03/2015   Procedure: TRANSURETHRAL RESECTION OF BLADDER TUMOR (TURBT);  Surgeon: Irine Seal, MD;  Location: [facility];  Service: Urology;  Laterality: N/A;  . TRANSURETHRAL RESECTION OF BLADDER TUMOR N/A 10/31/2015   Procedure: RE-STAGINGG TRANSURETHRAL RESECTION OF BLADDER TUMOR (TURBT);  Surgeon: Irine Seal, MD;  Location: [facility];  Service: Urology;  Laterality: N/A;  . TRANSURETHRAL RESECTION OF BLADDER TUMOR N/A 08/17/2016   Procedure: TRANSURETHRAL RESECTION OF BLADDER TUMOR (TURBT);  Surgeon: Cleon Gustin, MD;  Location: [facility];  Service: Urology;  Laterality: N/A;      Current Outpatient Medications:  .  albuterol (VENTOLIN HFA) 108 (90 Base) MCG/ACT inhaler, SMARTSIG:2 Puff(s) By Mouth Every 6 Hours PRN, Disp: , Rfl:  .  amLODipine (NORVASC) 2.5 MG tablet, Take 1 tablet (2.5 mg total) by mouth daily., Disp: 30 tablet, Rfl: 3 .  Blood Glucose Monitoring Suppl (ACCU-CHEK GUIDE) w/Device KIT, 1 Piece by Does not apply route as directed., Disp: 1 kit, Rfl: 0 .  Blood Glucose Monitoring Suppl (ACCU-CHEK GUIDE) w/Device KIT, 1 Piece by Does not apply route as directed., Disp: 1 kit, Rfl: 0 .  buPROPion (WELLBUTRIN XL) 300 MG 24 hr tablet, Take 300 mg by mouth every morning., Disp: , Rfl:  .  diclofenac (VOLTAREN) 75 MG EC tablet, Take 75 mg by mouth 2 (two) times daily., Disp: , Rfl:  .  FLUoxetine (PROZAC) 20 MG capsule, Take 20 mg by mouth at bedtime. , Disp: , Rfl:  .  gabapentin (NEURONTIN) 300 MG capsule, TAKE 1 CAPSULE(300 MG) BY MOUTH THREE TIMES DAILY, Disp: 90 capsule, Rfl: 0 .  glipiZIDE (GLUCOTROL XL) 5 MG 24 hr tablet, Take 1 tablet (5 mg total) by mouth daily with breakfast., Disp: 90 tablet, Rfl: 1 .   glucose blood (ACCU-CHEK GUIDE) test strip, Use as instructed, Disp: 150 each, Rfl: 2 .  hydrochlorothiazide (MICROZIDE) 12.5 MG capsule, Take 12.5 mg by mouth daily., Disp: , Rfl:  .  insulin glargine (LANTUS) 100 UNIT/ML Solostar Pen, Inject 24 Units into the skin at bedtime., Disp: 5 pen, Rfl: 3 .  Insulin Pen Needle (B-D ULTRAFINE III SHORT PEN) 31G X 8 MM MISC, 1 each by Does not apply route as directed., Disp: 100 each, Rfl: 3 .  levocetirizine (XYZAL) 5 MG tablet, Take 5 mg by mouth every evening., Disp: , Rfl:  .  metFORMIN (GLUCOPHAGE) 1000 MG tablet, Take 1 tablet (1,000 mg total) by mouth 2 (two) times daily with a  meal., Disp: 180 tablet, Rfl: 1 .  montelukast (SINGULAIR) 10 MG tablet, Take 10 mg by mouth at bedtime., Disp: , Rfl:  .  pantoprazole (PROTONIX) 40 MG tablet, Take 40 mg by mouth at bedtime. , Disp: , Rfl: 2 .  rosuvastatin (CRESTOR) 40 MG tablet, 1 tablet, Disp: , Rfl:  .  VYVANSE 40 MG capsule, Take 40 mg by mouth every morning., Disp: , Rfl:   Current Facility-Administered Medications:  .  betamethasone acetate-betamethasone sodium phosphate (CELESTONE) injection 3 mg, 3 mg, Intramuscular, Once, Evans, Brent M, DPM . betamethasone acetate-betamethasone sodium phosphate  3 mg Intramuscular Once     Physical Exam: Blood pressure 130/88, pulse 82, height _0  (1.499 m), weight 69.9 kg, SpO2 99 %.    Affect appropriate Healthy:  appears stated age 48: normal Neck supple with no adenopathy JVP normal no bruits no thyromegaly Lungs clear with no wheezing and good diaphragmatic motion Heart:  S1/S2 no murmur, no rub, gallop or click PMI normal Abdomen: benighn, BS positve, no tenderness, no AAA no bruit.  No HSM or HJR Distal pulses intact with no bruits No edema Neuro non-focal Bladder stimulator in left buttocks  No muscular weakness   Labs:   Lab Results  Component Value Date   WBC 10.9 (H) 01/17/2020   HGB 15.0 01/17/2020   HCT 47.8 (H)  01/17/2020   MCV 83.6 01/17/2020   PLT 395 01/17/2020   No results for input(s): NA, K, CL, CO2, BUN, CREATININE, CALCIUM, PROT, BILITOT, ALKPHOS, ALT, AST, GLUCOSE in the last 168 hours.  Invalid input(s): LABALBU Lab Results  Component Value Date   TROPONINI <0.03 02/24/2018   No results found for: CHOL No results found for: HDL No results found for: LDLCALC No results found for: TRIG No results found for: CHOLHDL No results found for: LDLDIRECT    Radiology: No results found.  EKG: SR right axis insignificant inferior Q waves 01/17/20   ASSESSMENT AND PLAN:   1. Chest Pain: atypical r/o in ER 01/17/20 CRF;s HLD, HTN, DM f/u lexiscan myovue ordered  2. HTN:  On norvasc and diuretic ? Allergy to ACE/ARB improved with compliance  3. DM:  Discussed low carb diet.  Target hemoglobin A1c is 6.5 or less.  Continue current medications. 4. HLD:  On statin labs with primary  5. Bladder Cancer:  F/u Dr Jeffie Pollock has stimulator for incontinence working well  Rosey Bath F/U Cardiology PRN   Signed: Jenkins Rouge 02/28/2020, 9:50 AM

## 2020-02-28 ENCOUNTER — Encounter: Payer: Self-pay | Admitting: Cardiovascular Disease

## 2020-02-28 ENCOUNTER — Ambulatory Visit: Payer: Medicaid Other | Admitting: Cardiovascular Disease

## 2020-02-28 ENCOUNTER — Other Ambulatory Visit: Payer: Self-pay

## 2020-02-28 VITALS — BP 130/88 | HR 82 | Ht 59.0 in | Wt 154.0 lb

## 2020-02-28 DIAGNOSIS — R079 Chest pain, unspecified: Secondary | ICD-10-CM

## 2020-02-28 NOTE — Patient Instructions (Signed)
Medication Instructions:  Your physician recommends that you continue on your current medications as directed. Please refer to the Current Medication list given to you today.  *If you need a refill on your cardiac medications before your next appointment, please call your pharmacy*   Lab Work: None today If you have labs (blood work) drawn today and your tests are completely normal, you will receive your results only by: Marland Kitchen MyChart Message (if you have MyChart) OR . A paper copy in the mail If you have any lab test that is abnormal or we need to change your treatment, we will call you to review the results.   Testing/Procedures: Your physician has requested that you have a lexiscan myoview. For further information please visit [email or web address]. Please follow instruction sheet, as given.     Follow-Up: At [facility], you and your health needs are our priority.  As part of our continuing mission to provide you with exceptional heart care, we have created designated Provider Care Teams.  These Care Teams include your primary Cardiologist (physician) and Advanced Practice Providers (APPs -  Physician Assistants and Nurse Practitioners) who all work together to provide you with the care you need, when you need it.  We recommend signing up for the patient portal called "MyChart".  Sign up information is provided on this After Visit Summary.  MyChart is used to connect with patients for Virtual Visits (Telemedicine).  Patients are able to view lab/test results, encounter notes, upcoming appointments, etc.  Non-urgent messages can be sent to your provider as well.   To learn more about what you can do with MyChart, go to [email or web address].    Your next appointment:  We will call you with test results.   Follow up as needed.       Thank you for choosing [facility] !

## 2020-02-29 ENCOUNTER — Other Ambulatory Visit: Payer: Self-pay | Admitting: Orthopaedic Surgery

## 2020-02-29 DIAGNOSIS — G8929 Other chronic pain: Secondary | ICD-10-CM

## 2020-03-07 ENCOUNTER — Ambulatory Visit (HOSPITAL_COMMUNITY)
Admission: RE | Admit: 2020-03-07 | Discharge: 2020-03-07 | Disposition: A | Discharge status: Home / Self Care | Payer: Medicaid Other | Source: Ambulatory Visit | Attending: Cardiovascular Disease | Admitting: Cardiovascular Disease

## 2020-03-07 ENCOUNTER — Encounter (HOSPITAL_COMMUNITY)
Admission: RE | Admit: 2020-03-07 | Discharge: 2020-03-07 | Disposition: A | Discharge status: Home / Self Care | Payer: Medicaid Other | Source: Ambulatory Visit | Attending: Cardiovascular Disease | Admitting: Cardiovascular Disease

## 2020-03-07 DIAGNOSIS — R079 Chest pain, unspecified: Secondary | ICD-10-CM | POA: Diagnosis present

## 2020-03-07 LAB — NM MYOCAR MULTI W/SPECT W/WALL MOTION / EF
LV dias vol: 80 mL (ref 46–106)
LV sys vol: 44 mL
Peak HR: 90 {beats}/min
RATE: 0.38
Rest HR: 72 {beats}/min
SDS: 5
SRS: 0
SSS: 5
TID: 0.93

## 2020-03-07 MED ORDER — TECHNETIUM TC 99M TETROFOSMIN IV KIT
10.0000 | PACK | Freq: Once | INTRAVENOUS | Status: AC | PRN
  Administered 2020-03-07: 11 via INTRAVENOUS

## 2020-03-07 MED ORDER — SODIUM CHLORIDE FLUSH 0.9 % IV SOLN
INTRAVENOUS | Status: AC
  Administered 2020-03-07: 10 mL via INTRAVENOUS
  Filled 2020-03-07: qty 10

## 2020-03-07 MED ORDER — TECHNETIUM TC 99M TETROFOSMIN IV KIT
30.0000 | PACK | Freq: Once | INTRAVENOUS | Status: AC | PRN
  Administered 2020-03-07: 32.6 via INTRAVENOUS

## 2020-03-07 MED ORDER — REGADENOSON 0.4 MG/5ML IV SOLN
INTRAVENOUS | Status: AC
  Administered 2020-03-07: 0.4 mg via INTRAVENOUS
  Filled 2020-03-07: qty 5

## 2020-03-08 ENCOUNTER — Telehealth: Payer: Self-pay

## 2020-03-08 DIAGNOSIS — R079 Chest pain, unspecified: Secondary | ICD-10-CM

## 2020-03-08 NOTE — Telephone Encounter (Signed)
-----   Message from Josue Hector, MD sent at 03/08/2020  7:19 AM EST ----- Breast attenuation low risk no need for cath. Echo to assess EF if not already ordered

## 2020-03-08 NOTE — Telephone Encounter (Signed)
Echo scheduled.

## 2020-03-08 NOTE — Telephone Encounter (Signed)
Left message to return call. Echo order entered.

## 2020-03-27 ENCOUNTER — Other Ambulatory Visit: Payer: Self-pay

## 2020-03-27 ENCOUNTER — Ambulatory Visit (HOSPITAL_COMMUNITY)
Admission: RE | Admit: 2020-03-27 | Discharge: 2020-03-27 | Disposition: A | Discharge status: Home / Self Care | Payer: Medicaid Other | Source: Ambulatory Visit | Attending: Cardiovascular Disease | Admitting: Cardiovascular Disease

## 2020-03-27 DIAGNOSIS — R079 Chest pain, unspecified: Secondary | ICD-10-CM | POA: Diagnosis not present

## 2020-03-27 LAB — ECHOCARDIOGRAM COMPLETE
Area-P 1/2: 3.91 cm2
S' Lateral: 3.2 cm

## 2020-03-27 NOTE — Progress Notes (Signed)
*  PRELIMINARY RESULTS* Echocardiogram 2D Echocardiogram has been performed.  Sandra [name] 03/27/2020, 3:58 PM

## 2020-03-29 ENCOUNTER — Other Ambulatory Visit: Payer: Self-pay

## 2020-03-29 ENCOUNTER — Encounter: Payer: Self-pay | Admitting: Urology

## 2020-03-29 ENCOUNTER — Ambulatory Visit (INDEPENDENT_AMBULATORY_CARE_PROVIDER_SITE_OTHER): Payer: Medicaid Other | Admitting: Urology

## 2020-03-29 VITALS — BP 178/111 | HR 90 | Temp 98.6°F | Ht 60.0 in | Wt 151.0 lb

## 2020-03-29 DIAGNOSIS — R3915 Urgency of urination: Secondary | ICD-10-CM

## 2020-03-29 DIAGNOSIS — Z8551 Personal history of malignant neoplasm of bladder: Secondary | ICD-10-CM

## 2020-03-29 LAB — URINALYSIS, ROUTINE W REFLEX MICROSCOPIC
Bilirubin, UA: NEGATIVE
Ketones, UA: NEGATIVE
Leukocytes,UA: NEGATIVE
Nitrite, UA: NEGATIVE
Protein,UA: NEGATIVE
RBC, UA: NEGATIVE
Specific Gravity, UA: 1.01 (ref 1.005–1.030)
Urobilinogen, Ur: 0.2 mg/dL (ref 0.2–1.0)
pH, UA: 5.5 (ref 5.0–7.5)

## 2020-03-29 NOTE — Progress Notes (Signed)
Urological Symptom Review  Patient is experiencing the following symptoms: Frequent urination Get up at night to urinate Leakage of urine Stream starts and stops Trouble starting stream   Review of Systems  Gastrointestinal (upper)  : Indigestion/heartburn  Gastrointestinal (lower) : Negative for lower GI symptoms  Constitutional : Fatigue  Skin: Negative for skin symptoms  Eyes: Negative for eye symptoms  Ear/Nose/Throat : Sinus problems  Hematologic/Lymphatic: Negative for Hematologic/Lymphatic symptoms  Cardiovascular : Negative for cardiovascular symptoms  Respiratory : Cough  Endocrine: Negative for endocrine symptoms  Musculoskeletal: Back pain  Neurological: Negative for neurological symptoms  Psychologic: Depression Anxiety

## 2020-03-29 NOTE — Progress Notes (Signed)
03/29/2020 2:28 PM   Sandra [name] [id number] [id number]  Referring provider: Joyice Faster, FNP [address] [place],  [place] [address]  Worsening urinary incontinence  HPI: Sandra [name] is a 45yo here for followup for urge incontinence and OAB. Starting in 11/17/22 her urgency and urge incontinence. Her daughter passed away in 11/17/2022 and husband passed away also recently. She ran out of insulin 2 weeks ago. She drinks 80oz of regular mountain dew   Nocturia 2x.    PMH: Past Medical History:  Diagnosis Date  . ADHD (attention deficit hyperactivity disorder)   . Allergic rhinitis   . Anemia    hx of  . Bipolar 1 disorder ([place])   . Bladder tumor   . Cancer ([place])    bladder  . Dizziness   . Full dentures   . GERD (gastroesophageal reflux disease)   . Gross hematuria   . Hyperlipidemia   . Hypertension   . Type 2 diabetes mellitus ([place])   . Urgency of urination    dysuria, sui  . Wears glasses     Surgical History: Past Surgical History:  Procedure Laterality Date  . CYSTOSCOPY N/A 10/03/2015   Procedure: CYSTOSCOPY;  Surgeon: Irine Seal, MD;  Location: [facility];  Service: Urology;  Laterality: N/A;  . CYSTOSCOPY WITH STENT PLACEMENT Right 10/31/2015   Procedure: CYSTOSCOPY WITH ATTEMPTED RIGHT URETERAL OPENING;  Surgeon: Irine Seal, MD;  Location: [facility];  Service: Urology;  Laterality: Right;  . INTERSTIM IMPLANT PLACEMENT N/A 03/14/2018   Procedure: [place]rie [place] IMPLANT FIRST STAGE;  Surgeon: Cleon Gustin, MD;  Location: [facility];  Service: Urology;  Laterality: N/A;  . INTERSTIM IMPLANT PLACEMENT N/A 03/30/2018   Procedure: Barrie Lyme IMPLANT SECOND STAGE;  Surgeon: Cleon Gustin, MD;  Location: [facility];  Service: Urology;  Laterality: N/A;  . MULTIPLE TOOTH EXTRACTIONS  2015  . OVARIAN CYST REMOVAL Right 2004 approx  . SHOULDER OPEN ROTATOR CUFF REPAIR Left 04/20/2019   Procedure: ROTATOR CUFF REPAIR SHOULDER OPEN;   Surgeon: Carole Civil, MD;  Location: [facility];  Service: Orthopedics;  Laterality: Left;  . TONSILLECTOMY  12/30/2004  . TOTAL ABDOMINAL HYSTERECTOMY W/ BILATERAL SALPINGOOPHORECTOMY  05/16/2004  . TRANSURETHRAL RESECTION OF BLADDER TUMOR N/A 10/03/2015   Procedure: TRANSURETHRAL RESECTION OF BLADDER TUMOR (TURBT);  Surgeon: Irine Seal, MD;  Location: [facility];  Service: Urology;  Laterality: N/A;  . TRANSURETHRAL RESECTION OF BLADDER TUMOR N/A 10/31/2015   Procedure: RE-STAGINGG TRANSURETHRAL RESECTION OF BLADDER TUMOR (TURBT);  Surgeon: Irine Seal, MD;  Location: [facility];  Service: Urology;  Laterality: N/A;  . TRANSURETHRAL RESECTION OF BLADDER TUMOR N/A 08/17/2016   Procedure: TRANSURETHRAL RESECTION OF BLADDER TUMOR (TURBT);  Surgeon: Cleon Gustin, MD;  Location: [facility];  Service: Urology;  Laterality: N/A;    Home Medications:  Allergies as of 03/29/2020      Reactions   Benazepril Hives   Ibuprofen Hives   itching   Metronidazole Nausea And Vomiting   Oxycodone    Pt has tolerated hydromorphone in the past and takes Norco at home.   Adhesive [tape] Rash   Codeine Nausea And Vomiting, Rash   Loratadine Rash   Nystatin-triamcinolone Rash   Penicillins Rash   Has patient had a PCN reaction causing immediate rash, facial/tongue/throat swelling, SOB or lightheadedness with hypotension: No Has patient had a PCN reaction causing severe rash involving mucus membranes or skin necrosis: Yes Has  patient had a PCN reaction that required hospitalization: No Has patient had a PCN reaction occurring within the last 10 years: no If all of the above answers are "NO", then may proceed with Cephalosporin use.      Medication List       Accurate as of March 29, 2020  2:28 PM. If you have any questions, ask your nurse or doctor.        Accu-Chek Guide test strip Generic drug: glucose blood Use as instructed   Accu-Chek Guide w/Device Kit 1  Piece by Does not apply route as directed.   Accu-Chek Guide w/Device Kit 1 Piece by Does not apply route as directed.   albuterol 108 (90 Base) MCG/ACT inhaler Commonly known as: VENTOLIN HFA SMARTSIG:2 Puff(s) By Mouth Every 6 Hours PRN   amLODipine 2.5 MG tablet Commonly known as: NORVASC Take 1 tablet (2.5 mg total) by mouth daily.   B-D ULTRAFINE III SHORT PEN 31G X 8 MM Misc Generic drug: Insulin Pen Needle 1 each by Does not apply route as directed.   buPROPion 300 MG 24 hr tablet Commonly known as: WELLBUTRIN XL Take 300 mg by mouth every morning.   diclofenac 75 MG EC tablet Commonly known as: VOLTAREN Take 75 mg by mouth 2 (two) times daily.   FLUoxetine 20 MG capsule Commonly known as: PROZAC Take 20 mg by mouth at bedtime.   gabapentin 300 MG capsule Commonly known as: NEURONTIN TAKE 1 CAPSULE(300 MG) BY MOUTH THREE TIMES DAILY   glipiZIDE 5 MG 24 hr tablet Commonly known as: GLUCOTROL XL Take 1 tablet (5 mg total) by mouth daily with breakfast.   hydrochlorothiazide 12.5 MG capsule Commonly known as: MICROZIDE Take 12.5 mg by mouth daily.   insulin glargine 100 UNIT/ML Solostar Pen Commonly known as: LANTUS Inject 24 Units into the skin at bedtime.   levocetirizine 5 MG tablet Commonly known as: XYZAL Take 5 mg by mouth every evening.   metFORMIN 1000 MG tablet Commonly known as: GLUCOPHAGE Take 1 tablet (1,000 mg total) by mouth 2 (two) times daily with a meal.   montelukast 10 MG tablet Commonly known as: SINGULAIR Take 10 mg by mouth at bedtime.   pantoprazole 40 MG tablet Commonly known as: PROTONIX Take 40 mg by mouth at bedtime.   rosuvastatin 40 MG tablet Commonly known as: CRESTOR 1 tablet   Vyvanse 40 MG capsule Generic drug: lisdexamfetamine Take 40 mg by mouth every morning.       Allergies:  Allergies  Allergen Reactions  . Benazepril Hives  . Ibuprofen Hives    itching  . Metronidazole Nausea And Vomiting  .  Oxycodone     Pt has tolerated hydromorphone in the past and takes Norco at home.  . Adhesive [Tape] Rash  . Codeine Nausea And Vomiting and Rash  . Loratadine Rash  . Nystatin-Triamcinolone Rash  . Penicillins Rash    Has patient had a PCN reaction causing immediate rash, facial/tongue/throat swelling, SOB or lightheadedness with hypotension: No Has patient had a PCN reaction causing severe rash involving mucus membranes or skin necrosis: Yes Has patient had a PCN reaction that required hospitalization: No Has patient had a PCN reaction occurring within the last 10 years: no  If all of the above answers are "NO", then may proceed with Cephalosporin use.    Family History: Family History  Problem Relation Age of Onset  . Hypertension Mother   . Cancer Mother   . Breast cancer Maternal  Aunt   . Lung cancer Maternal Uncle   . Cancer Other   . Diabetes Daughter     Social History:  reports that she quit smoking about 4 months ago. Her smoking use included cigarettes. She has a 5.00 pack-year smoking history. She has never used smokeless tobacco. She reports that she does not drink alcohol and does not use drugs.  ROS: All other review of systems were reviewed and are negative except what is noted above in HPI  Physical Exam: BP (!) 178/111   Pulse 90   Temp 98.6 F (37 C)   Ht 5' (1.524 m)   Wt 151 lb (68.5 kg)   BMI 29.49 kg/m   Constitutional:  Alert and oriented, No acute distress. HEENT: [place] AT, moist mucus membranes.  Trachea midline, no masses. Cardiovascular: No clubbing, cyanosis, or edema. Respiratory: Normal respiratory effort, no increased work of breathing. GI: Abdomen is soft, nontender, nondistended, no abdominal masses GU: No CVA tenderness.  Lymph: No cervical or inguinal lymphadenopathy. Skin: No rashes, bruises or suspicious lesions. Neurologic: Grossly intact, no focal deficits, moving all 4 extremities. Psychiatric: Normal mood and  affect.  Laboratory Data: Lab Results  Component Value Date   WBC 10.9 (H) 01/17/2020   HGB 15.0 01/17/2020   HCT 47.8 (H) 01/17/2020   MCV 83.6 01/17/2020   PLT 395 01/17/2020    Lab Results  Component Value Date   CREATININE 0.69 01/17/2020    No results found for: PSA  No results found for: TESTOSTERONE  Lab Results  Component Value Date   HGBA1C 8.5 (H) 04/17/2019    Urinalysis    Component Value Date/Time   COLORURINE STRAW (A) 12/17/2018 0020   APPEARANCEUR Clear 03/29/2020 1412   LABSPEC 1.030 12/17/2018 0020   PHURINE 5.0 12/17/2018 0020   GLUCOSEU 3+ (A) 03/29/2020 1412   HGBUR NEGATIVE 12/17/2018 0020   BILIRUBINUR Negative 03/29/2020 1412   KETONESUR NEGATIVE 12/17/2018 0020   PROTEINUR Negative 03/29/2020 1412   PROTEINUR NEGATIVE 12/17/2018 0020   UROBILINOGEN 0.2 03/01/2011 1920   NITRITE Negative 03/29/2020 1412   NITRITE NEGATIVE 12/17/2018 0020   LEUKOCYTESUR Negative 03/29/2020 1412   LEUKOCYTESUR NEGATIVE 12/17/2018 0020    Lab Results  Component Value Date   LABMICR Comment 03/29/2020   BACTERIA NONE SEEN 12/17/2018    Pertinent Imaging:  No results found for this or any previous visit.  No results found for this or any previous visit.  No results found for this or any previous visit.  No results found for this or any previous visit.  Results for orders placed during the hospital encounter of 01/08/16  US Renal  Narrative CLINICAL DATA:  Malignant tumor of trigone of bladder. Cystoscopy and trans urethral resection of bladder tumor 10/03/2015 and 10/31/2015.  EXAM: RENAL / URINARY TRACT ULTRASOUND COMPLETE  COMPARISON:  None.  FINDINGS: Right Kidney:  Length: 12.0 cm, within normal limits. Echogenicity within normal limits. No mass or hydronephrosis visualized.  Left Kidney:  Length: 12.4 cm, within normal limits. Echogenicity within normal limits. No mass or hydronephrosis visualized.  Bladder:  The bladder  is not well distended.  No discrete lesions are evident.  IMPRESSION: Negative bilateral renal ultrasound.   Electronically Signed By: San Morelle M.D. On: 01/08/2016 12:27  No results found for this or any previous visit.  No results found for this or any previous visit.  No results found for this or any previous visit.   Assessment & Plan:  1. Urge incontinence -decrease mountain dew intake -A1c today, will call with results.  -voiding diary and RTC 2 weeks with Interstim rep.  - Urinalysis, Routine w reflex microscopic   No follow-ups on file.  [name], MD  [facility] [place]

## 2020-04-01 ENCOUNTER — Telehealth: Payer: Self-pay

## 2020-04-01 ENCOUNTER — Telehealth: Payer: Self-pay | Admitting: Cardiovascular Disease

## 2020-04-01 DIAGNOSIS — R931 Abnormal findings on diagnostic imaging of heart and coronary circulation: Secondary | ICD-10-CM

## 2020-04-01 DIAGNOSIS — R079 Chest pain, unspecified: Secondary | ICD-10-CM

## 2020-04-01 LAB — HEMOGLOBIN A1C
Est. average glucose Bld gHb Est-mCnc: 369 mg/dL
Hgb A1c MFr Bld: 14.5 % — ABNORMAL HIGH (ref 4.8–5.6)

## 2020-04-01 NOTE — Telephone Encounter (Signed)
Spoke with patient regarding preferred scheduling weekdays and times for the Cardiac MRI ordered by Dr. Johnsie Cancel.  Informed patient as soon as we hear regarding the insurance prior authorization,  I will be in touch with the appointment.  She voiced her understanding.,

## 2020-04-01 NOTE — Telephone Encounter (Signed)
Discussed echo results with patient.She states she still has CP now and again and would like cardiac MRI. I will place order and await pre-cert clearance. Patient is aware it is done at [facility].

## 2020-04-02 ENCOUNTER — Encounter: Payer: Self-pay | Admitting: Cardiovascular Disease

## 2020-04-02 NOTE — Telephone Encounter (Signed)
Spoke with patient regarding the Monday 05/13/20 12:00 pm Cardiac MRI appointment at [facility]---arrival time is 11:30 am--1st floor admissions office for check in.  Will mail information to patient and it is available in My Chart. Patient voiced  Her understanding

## 2020-04-04 ENCOUNTER — Other Ambulatory Visit: Payer: Self-pay

## 2020-04-04 ENCOUNTER — Ambulatory Visit: Payer: Medicaid Other

## 2020-04-08 ENCOUNTER — Other Ambulatory Visit: Payer: Self-pay

## 2020-04-08 ENCOUNTER — Emergency Department (HOSPITAL_COMMUNITY)
Admission: EM | Admit: 2020-04-08 | Discharge: 2020-04-08 | Disposition: A | Discharge status: Home / Self Care | Payer: Medicaid Other | Attending: Emergency Medicine | Admitting: Emergency Medicine

## 2020-04-08 ENCOUNTER — Emergency Department (HOSPITAL_COMMUNITY): Payer: Medicaid Other

## 2020-04-08 DIAGNOSIS — R079 Chest pain, unspecified: Secondary | ICD-10-CM | POA: Diagnosis not present

## 2020-04-08 DIAGNOSIS — Z7984 Long term (current) use of oral hypoglycemic drugs: Secondary | ICD-10-CM | POA: Insufficient documentation

## 2020-04-08 DIAGNOSIS — Z87891 Personal history of nicotine dependence: Secondary | ICD-10-CM | POA: Insufficient documentation

## 2020-04-08 DIAGNOSIS — E1165 Type 2 diabetes mellitus with hyperglycemia: Secondary | ICD-10-CM | POA: Insufficient documentation

## 2020-04-08 DIAGNOSIS — Z8551 Personal history of malignant neoplasm of bladder: Secondary | ICD-10-CM | POA: Insufficient documentation

## 2020-04-08 DIAGNOSIS — Z794 Long term (current) use of insulin: Secondary | ICD-10-CM | POA: Diagnosis not present

## 2020-04-08 DIAGNOSIS — I10 Essential (primary) hypertension: Secondary | ICD-10-CM | POA: Diagnosis not present

## 2020-04-08 DIAGNOSIS — Z79899 Other long term (current) drug therapy: Secondary | ICD-10-CM | POA: Insufficient documentation

## 2020-04-08 DIAGNOSIS — R739 Hyperglycemia, unspecified: Secondary | ICD-10-CM

## 2020-04-08 LAB — CBC
HCT: 46.9 % — ABNORMAL HIGH (ref 36.0–46.0)
Hemoglobin: 15 g/dL (ref 12.0–15.0)
MCH: 26.5 pg (ref 26.0–34.0)
MCHC: 32 g/dL (ref 30.0–36.0)
MCV: 82.7 fL (ref 80.0–100.0)
Platelets: 380 10*3/uL (ref 150–400)
RBC: 5.67 MIL/uL — ABNORMAL HIGH (ref 3.87–5.11)
RDW: 13.3 % (ref 11.5–15.5)
WBC: 8.3 10*3/uL (ref 4.0–10.5)
nRBC: 0 % (ref 0.0–0.2)

## 2020-04-08 LAB — URINALYSIS, ROUTINE W REFLEX MICROSCOPIC
Bacteria, UA: NONE SEEN
Bilirubin Urine: NEGATIVE
Glucose, UA: >=500 mg/dL — AB
Hgb urine dipstick: NEGATIVE
Ketones, ur: NEGATIVE mg/dL
Leukocytes,Ua: NEGATIVE
Nitrite: NEGATIVE
Protein, ur: NEGATIVE mg/dL
Specific Gravity, Urine: 1.028 (ref 1.005–1.030)
pH: 5 (ref 5.0–8.0)

## 2020-04-08 LAB — BASIC METABOLIC PANEL
Anion gap: 11 (ref 5–15)
BUN: 14 mg/dL (ref 6–20)
CO2: 21 mmol/L — ABNORMAL LOW (ref 22–32)
Calcium: 9.1 mg/dL (ref 8.9–10.3)
Chloride: 92 mmol/L — ABNORMAL LOW (ref 98–111)
Creatinine, Ser: 0.79 mg/dL (ref 0.44–1.00)
GFR, Estimated: >60 mL/min (ref >60)
Glucose, Bld: 452 mg/dL — ABNORMAL HIGH (ref 70–99)
Potassium: 3.9 mmol/L (ref 3.5–5.1)
Sodium: 124 mmol/L — ABNORMAL LOW (ref 135–145)

## 2020-04-08 LAB — HEPATIC FUNCTION PANEL
ALT: 24 U/L (ref 0–44)
AST: 20 U/L (ref 15–41)
Albumin: 3.8 g/dL (ref 3.5–5.0)
Alkaline Phosphatase: 112 U/L (ref 38–126)
Bilirubin, Direct: <0.1 mg/dL (ref 0.0–0.2)
Total Bilirubin: 0.3 mg/dL (ref 0.3–1.2)
Total Protein: 7.6 g/dL (ref 6.5–8.1)

## 2020-04-08 LAB — GLUCOSE, CAPILLARY: Glucose-Capillary: 430 mg/dL — ABNORMAL HIGH (ref 70–99)

## 2020-04-08 LAB — TROPONIN I (HIGH SENSITIVITY)
Troponin I (High Sensitivity): 4 ng/L (ref <18)
Troponin I (High Sensitivity): 4 ng/L (ref <18)

## 2020-04-08 LAB — CBG MONITORING, ED: Glucose-Capillary: 257 mg/dL — ABNORMAL HIGH (ref 70–99)

## 2020-04-08 MED ORDER — MORPHINE SULFATE (PF) 4 MG/ML IV SOLN
4.0000 mg | Freq: Once | INTRAVENOUS | Status: AC
Start: 2020-04-08 — End: 2020-04-08
  Administered 2020-04-08: 4 mg via INTRAVENOUS
  Filled 2020-04-08: qty 1

## 2020-04-08 MED ORDER — SODIUM CHLORIDE 0.9 % IV BOLUS
1000.0000 mL | Freq: Once | INTRAVENOUS | Status: AC
  Administered 2020-04-08: 1000 mL via INTRAVENOUS

## 2020-04-08 MED ORDER — INSULIN ASPART 100 UNIT/ML ~~LOC~~ SOLN
10.0000 [IU] | Freq: Once | SUBCUTANEOUS | Status: AC
  Administered 2020-04-08: 10 [IU] via SUBCUTANEOUS
  Filled 2020-04-08: qty 1

## 2020-04-08 MED ORDER — LIDOCAINE VISCOUS HCL 2 % MT SOLN: 15.0000 mL | Freq: Once | OROMUCOSAL | Status: DC

## 2020-04-08 MED ORDER — ALUM & MAG HYDROXIDE-SIMETH 200-200-20 MG/5ML PO SUSP: 30.0000 mL | Freq: Once | ORAL | Status: DC

## 2020-04-08 NOTE — ED Provider Notes (Signed)
[facility] EMERGENCY DEPARTMENT Provider Note   CSN: [id number] Arrival date & time: 04/08/20  1558     History Chief Complaint  Patient presents with  . Chest Pain    Sandra [name] is a 45 y.o. female.  HPI   This patient is a 45 year old female, she has a known history of type 2 diabetes, she does take both insulin and oral medications, she also has a history of hypertension and hyperlipidemia, she has a history of bladder cancer.  She is currently under the care of the [facility] where she went several days ago for a follow-up, she was told today that she had abnormal lab work, including abnormal blood sugar which was very high, salt level which was very low, and abnormal triglycerides.  She is able to share with me from her MyChart account that her last hemoglobin A1c was over 14.  She reports she does take her medications but she also eats whatever she wants and is not caring for herself very well.  She has had frequent urination, frequent thirst, she has no abdominal pain, she did develop some chest pain today due to the anxiety thinking about having to come to the ER.  She is not short of breath, not having fevers, not having diarrhea and denies any swelling at this time.  The chest pain was intermittent, came on approximately 1 hour ago, it is now resolved, not associated with coughing or shortness of breath or fever  The patient does follow with cardiology here, she has had an echocardiogram showing a low normal ejection fraction with some slight wall motion abnormalities, she is pending a cardiac MRI to look for further abnormalities however she has had multiple visits to the cardiologist as well as to the ER for some underlying chronic chest pain  Past Medical History:  Diagnosis Date  . ADHD (attention deficit hyperactivity disorder)   . Allergic rhinitis   . Anemia    hx of  . Bipolar 1 disorder ([place])   . Bladder tumor   . Cancer ([place])    bladder  .  Dizziness   . Full dentures   . GERD (gastroesophageal reflux disease)   . Gross hematuria   . Hyperlipidemia   . Hypertension   . Type 2 diabetes mellitus ([place])   . Urgency of urination    dysuria, sui  . Wears glasses     Patient Active Problem List   Diagnosis Date Noted  . Essential hypertension, benign 05/15/2019  . S/P left rotator cuff repair 04/20/19  04/27/2019  . Complete tear of left rotator cuff   . Uncontrolled type 2 diabetes mellitus with hyperglycemia ([place]) 10/10/2018  . Mixed hyperlipidemia 10/10/2018  . Sacroiliac joint pain 02/23/2017  . Chronic low back pain 02/09/2017  . Somatic dysfunction of left sacroiliac joint 02/09/2017  . GAD (generalized anxiety disorder) 12/17/2016  . MDD (major depressive disorder), recurrent episode, moderate ([place]) 12/17/2016  . Attention deficit hyperactivity disorder (ADHD) 12/17/2016  . Postoperative anemia due to acute blood loss 11/04/2015  . Bladder cancer ([place]) 10/31/2015  . CARPAL TUNNEL SYNDROME 10/02/2009    Past Surgical History:  Procedure Laterality Date  . CYSTOSCOPY N/A 10/03/2015   Procedure: CYSTOSCOPY;  Surgeon: Irine Seal, MD;  Location: [facility];  Service: Urology;  Laterality: N/A;  . CYSTOSCOPY WITH STENT PLACEMENT Right 10/31/2015   Procedure: CYSTOSCOPY WITH ATTEMPTED RIGHT URETERAL OPENING;  Surgeon: Irine Seal, MD;  Location: [facility];  Service: Urology;  Laterality: Right;  . INTERSTIM IMPLANT PLACEMENT N/A 03/14/2018   Procedure: Barrie Lyme IMPLANT FIRST STAGE;  Surgeon: Cleon Gustin, MD;  Location: [facility];  Service: Urology;  Laterality: N/A;  . INTERSTIM IMPLANT PLACEMENT N/A 03/30/2018   Procedure: Barrie Lyme IMPLANT SECOND STAGE;  Surgeon: Cleon Gustin, MD;  Location: [facility];  Service: Urology;  Laterality: N/A;  . MULTIPLE TOOTH EXTRACTIONS  2015  . OVARIAN CYST REMOVAL Right 2004 approx  . SHOULDER OPEN ROTATOR CUFF REPAIR Left 04/20/2019   Procedure:  ROTATOR CUFF REPAIR SHOULDER OPEN;  Surgeon: Carole Civil, MD;  Location: [facility];  Service: Orthopedics;  Laterality: Left;  . TONSILLECTOMY  12/30/2004  . TOTAL ABDOMINAL HYSTERECTOMY W/ BILATERAL SALPINGOOPHORECTOMY  05/16/2004  . TRANSURETHRAL RESECTION OF BLADDER TUMOR N/A 10/03/2015   Procedure: TRANSURETHRAL RESECTION OF BLADDER TUMOR (TURBT);  Surgeon: Irine Seal, MD;  Location: [facility];  Service: Urology;  Laterality: N/A;  . TRANSURETHRAL RESECTION OF BLADDER TUMOR N/A 10/31/2015   Procedure: RE-STAGINGG TRANSURETHRAL RESECTION OF BLADDER TUMOR (TURBT);  Surgeon: Irine Seal, MD;  Location: [facility];  Service: Urology;  Laterality: N/A;  . TRANSURETHRAL RESECTION OF BLADDER TUMOR N/A 08/17/2016   Procedure: TRANSURETHRAL RESECTION OF BLADDER TUMOR (TURBT);  Surgeon: Cleon Gustin, MD;  Location: [facility];  Service: Urology;  Laterality: N/A;     OB History    Gravida  2   Para      Term      Preterm      AB      Living  2     SAB      IAB      Ectopic      Multiple      Live Births              Family History  Problem Relation Age of Onset  . Hypertension Mother   . Cancer Mother   . Breast cancer Maternal Aunt   . Lung cancer Maternal Uncle   . Cancer Other   . Diabetes Daughter     Social History   Tobacco Use  . Smoking status: Former Smoker    Packs/day: 0.50    Years: 10.00    Pack years: 5.00    Types: Cigarettes    Quit date: 11/12/2019    Years since quitting: 0.4  . Smokeless tobacco: Never Used  Vaping Use  . Vaping Use: Never used  Substance Use Topics  . Alcohol use: No  . Drug use: No    Home Medications Prior to Admission medications   Medication Sig Start Date End Date Taking? Authorizing Provider  amLODipine (NORVASC) 2.5 MG tablet Take 1 tablet (2.5 mg total) by mouth daily. 01/18/20  Yes Orpah Greek, MD  buPROPion (WELLBUTRIN XL) 300 MG 24 hr tablet Take 300 mg by  mouth every morning. 01/18/20  Yes [provider]  cloNIDine HCl (KAPVAY) 0.1 MG TB12 ER tablet Take 0.1 mg by mouth every evening. 02/29/20  Yes [provider]  diclofenac (VOLTAREN) 75 MG EC tablet Take 75 mg by mouth 2 (two) times daily. 09/25/19  Yes [provider]  FLUoxetine (PROZAC) 40 MG capsule Take 40 mg by mouth daily. 04/02/20  Yes [provider]  gabapentin (NEURONTIN) 300 MG capsule TAKE 1 CAPSULE(300 MG) BY MOUTH THREE TIMES DAILY Patient taking differently: Take 300 mg by mouth 3 (three) times daily. 08/08/19  Yes Edrick Kins,  DPM  glipiZIDE (GLUCOTROL XL) 5 MG 24 hr tablet Take 1 tablet (5 mg total) by mouth daily with breakfast. 05/15/19  Yes Nida, Marella Chimes, MD  hydrochlorothiazide (MICROZIDE) 12.5 MG capsule Take 12.5 mg by mouth daily.   Yes [provider]  insulin glargine (LANTUS) 100 UNIT/ML Solostar Pen Inject 24 Units into the skin at bedtime. Patient taking differently: Inject 30 Units into the skin at bedtime. 05/15/19  Yes Nida, Marella Chimes, MD  levocetirizine (XYZAL) 5 MG tablet Take 5 mg by mouth every evening.   Yes [provider]  metFORMIN (GLUCOPHAGE) 1000 MG tablet Take 1 tablet (1,000 mg total) by mouth 2 (two) times daily with a meal. 05/15/19  Yes Nida, Marella Chimes, MD  montelukast (SINGULAIR) 10 MG tablet Take 10 mg by mouth at bedtime.   Yes [provider]  pantoprazole (PROTONIX) 40 MG tablet Take 40 mg by mouth at bedtime.  10/09/14  Yes [provider]  rosuvastatin (CRESTOR) 40 MG tablet Take 40 mg by mouth daily.   Yes [provider]  VYVANSE 40 MG capsule Take 40 mg by mouth every morning. 12/20/19  Yes [provider]  Blood Glucose Monitoring Suppl (ACCU-CHEK GUIDE) w/Device KIT 1 Piece by Does not apply route as directed. 10/10/18   Cassandria Anger, MD  Blood Glucose Monitoring Suppl (ACCU-CHEK GUIDE) w/Device KIT 1 Piece by Does not apply route  as directed. 02/15/19   Cassandria Anger, MD  glucose blood (ACCU-CHEK GUIDE) test strip Use as instructed 02/15/19   Cassandria Anger, MD  Insulin Pen Needle (B-D ULTRAFINE III SHORT PEN) 31G X 8 MM MISC 1 each by Does not apply route as directed. 02/15/19   Cassandria Anger, MD    Allergies    Benazepril, Diphenhydramine, Ibuprofen, Metronidazole, Oxycodone, Adhesive [tape], Codeine, Loratadine, Nystatin-triamcinolone, and Penicillins  Review of Systems   Review of Systems  All other systems reviewed and are negative.   Physical Exam Updated Vital Signs BP (!) 142/96   Pulse 76   Temp 98.2 F (36.8 C) (Oral)   Resp 17   SpO2 99%   Physical Exam Vitals and nursing note reviewed.  Constitutional:      General: She is not in acute distress.    Appearance: She is well-developed.  HENT:     Head: Normocephalic and atraumatic.     Mouth/Throat:     Pharynx: No oropharyngeal exudate.  Eyes:     General: No scleral icterus.       Right eye: No discharge.        Left eye: No discharge.     Conjunctiva/sclera: Conjunctivae normal.     Pupils: Pupils are equal, round, and reactive to light.  Neck:     Thyroid: No thyromegaly.     Vascular: No JVD.  Cardiovascular:     Rate and Rhythm: Normal rate and regular rhythm.     Heart sounds: Normal heart sounds. No murmur heard. No friction rub. No gallop.   Pulmonary:     Effort: Pulmonary effort is normal. No respiratory distress.     Breath sounds: Normal breath sounds. No wheezing or rales.  Abdominal:     General: Bowel sounds are normal. There is no distension.     Palpations: Abdomen is soft. There is no mass.     Tenderness: There is no abdominal tenderness.  Musculoskeletal:        General: No tenderness. Normal range of motion.  Cervical back: Normal range of motion and neck supple.  Lymphadenopathy:     Cervical: No cervical adenopathy.  Skin:    General: Skin is warm and dry.     Findings: No erythema  or rash.  Neurological:     Mental Status: She is alert.     Coordination: Coordination normal.  Psychiatric:        Behavior: Behavior normal.     ED Results / Procedures / Treatments   Labs (all labs ordered are listed, but only abnormal results are displayed) Labs Reviewed  BASIC METABOLIC PANEL - Abnormal; Notable for the following components:      Result Value   Sodium 124 (*)    Chloride 92 (*)    CO2 21 (*)    Glucose, Bld 452 (*)    All other components within normal limits  CBC - Abnormal; Notable for the following components:   RBC 5.67 (*)    HCT 46.9 (*)    All other components within normal limits  GLUCOSE, CAPILLARY - Abnormal; Notable for the following components:   Glucose-Capillary 430 (*)    All other components within normal limits  URINALYSIS, ROUTINE W REFLEX MICROSCOPIC - Abnormal; Notable for the following components:   Color, Urine STRAW (*)    Glucose, UA >=500 (*)    All other components within normal limits  CBG MONITORING, ED - Abnormal; Notable for the following components:   Glucose-Capillary 257 (*)    All other components within normal limits  HEPATIC FUNCTION PANEL  TROPONIN I (HIGH SENSITIVITY)  TROPONIN I (HIGH SENSITIVITY)    EKG EKG Interpretation  Date/Time:  Monday April 08 2020 17:11:00 EDT Ventricular Rate:  85 PR Interval:  150 QRS Duration: 104 QT Interval:  388 QTC Calculation: 461 R Axis:   96 Text Interpretation: Normal sinus rhythm Rightward axis Cannot rule out Anterior infarct , age undetermined Abnormal ECG Confirmed by Noemi Chapel [phone]) on 04/08/2020 5:53:03 PM   Radiology DG Chest 2 View  Result Date: 04/08/2020 CLINICAL DATA:  Chest pain. EXAM: CHEST - 2 VIEW COMPARISON:  January 16, 2018. FINDINGS: The heart size and mediastinal contours are within normal limits. Both lungs are clear. No pneumothorax or pleural effusion is noted. The visualized skeletal structures are unremarkable. IMPRESSION: No active  cardiopulmonary disease. Electronically Signed   By: Marijo Conception M.D.   On: 04/08/2020 18:50    Procedures Procedures   Medications Ordered in ED Medications  sodium chloride 0.9 % bolus 1,000 mL (1,000 mLs Intravenous New Bag/Given 04/08/20 1807)  insulin aspart (novoLOG) injection 10 Units (10 Units Subcutaneous Given 04/08/20 1807)  morphine 4 MG/ML injection 4 mg (4 mg Intravenous Given 04/08/20 1952)    ED Course  I have reviewed the triage vital signs and the nursing notes.  Pertinent labs & imaging results that were available during my care of the patient were reviewed by me and considered in my medical decision making (see chart for details).  Clinical Course as of 04/08/20 2024  Mon Apr 08, 2020  1909 Initial troponin is negative, second troponin is in process.  She has no ketones in the urine, she has no anion gap acidosis and her CO2 is 21, LFTs are normal, sodium is 124 which is likely a pseudohyponatremia as her corrected level comes back at 132. [BM]  1910 The patient is asking for pain medication, she has been given a single dose of pain medicine while the second troponin is running. [  BM]    Clinical Course User Index [BM] Noemi Chapel, MD   MDM Rules/Calculators/A&P                          Normal exam VS with mild hypertension Needs w/u for hyperglycemia - doesn't appear ill or toxic and has no signs of DKA clinicially - fluids, insuling, r/o DKA or MI - EKG reassuring  Repeat blood sugar improved, closer to 250, no DKA, patient admonished on her medication use and dietary choices, she is agreeable.  She still has chest pain, this seems to be chronic  Negative troponin  Final Clinical Impression(s) / ED Diagnoses Final diagnoses:  Chest pain, unspecified type  Hyperglycemia      Noemi Chapel, MD 04/08/20 2024

## 2020-04-08 NOTE — ED Triage Notes (Signed)
States she was sent from [facility] for evaluation of abnormal labs. States she has chest pain on arrival

## 2020-04-08 NOTE — Discharge Instructions (Signed)
Your blood sugar has come down significantly and is closer to 250.  Though this is not normal it is much better.  He will need to do better with her eating eating far less carbohydrates, taking your medication exactly as prescribed and following up with your doctor.  There was no signs of heart attack on your work-up, please see your doctor for further evaluation and follow-up with cardiology this week  ER for worsening symptoms

## 2020-04-08 NOTE — ED Notes (Signed)
Patient transported to X-ray 

## 2020-04-09 ENCOUNTER — Encounter: Payer: Self-pay | Admitting: Gastroenterology

## 2020-04-12 ENCOUNTER — Ambulatory Visit: Payer: Medicaid Other | Admitting: Urology

## 2020-04-12 ENCOUNTER — Telehealth: Payer: Self-pay

## 2020-04-12 DIAGNOSIS — N3281 Overactive bladder: Secondary | ICD-10-CM

## 2020-04-12 DIAGNOSIS — R3915 Urgency of urination: Secondary | ICD-10-CM

## 2020-04-12 NOTE — Telephone Encounter (Signed)
Called and got the interstim rep Colletta Maryland) voicemail. Message left about Ms. Sandra [name] cancelling appointment and to call back with her next availability dates. Patient would be rescheduled based on Ms Amalia Hailey availability.

## 2020-04-15 ENCOUNTER — Ambulatory Visit (INDEPENDENT_AMBULATORY_CARE_PROVIDER_SITE_OTHER): Payer: Medicaid Other | Admitting: Physician Assistant

## 2020-04-15 ENCOUNTER — Other Ambulatory Visit: Payer: Self-pay

## 2020-04-15 ENCOUNTER — Telehealth: Payer: Self-pay

## 2020-04-15 ENCOUNTER — Encounter: Payer: Self-pay | Admitting: Physician Assistant

## 2020-04-15 VITALS — BP 142/92 | HR 78 | Ht 60.0 in | Wt 155.0 lb

## 2020-04-15 DIAGNOSIS — R079 Chest pain, unspecified: Secondary | ICD-10-CM | POA: Diagnosis not present

## 2020-04-15 DIAGNOSIS — E1165 Type 2 diabetes mellitus with hyperglycemia: Secondary | ICD-10-CM

## 2020-04-15 DIAGNOSIS — F439 Reaction to severe stress, unspecified: Secondary | ICD-10-CM

## 2020-04-15 DIAGNOSIS — I10 Essential (primary) hypertension: Secondary | ICD-10-CM | POA: Diagnosis not present

## 2020-04-15 DIAGNOSIS — E782 Mixed hyperlipidemia: Secondary | ICD-10-CM

## 2020-04-15 MED ORDER — ACCU-CHEK GUIDE W/DEVICE KIT: 1.0000 | PACK | 0 refills | Status: DC

## 2020-04-15 MED ORDER — ACCU-CHEK GUIDE W/DEVICE KIT: 1.0000 | PACK | 0 refills | Status: AC

## 2020-04-15 MED ORDER — ACCU-CHEK GUIDE VI STRP: ORAL_STRIP | 2 refills | Status: DC

## 2020-04-15 MED ORDER — AMLODIPINE BESYLATE 5 MG PO TABS: 5.0000 mg | ORAL_TABLET | Freq: Every day | ORAL | 3 refills | Status: DC

## 2020-04-15 MED ORDER — METOPROLOL SUCCINATE ER 25 MG PO TB24: 25.0000 mg | ORAL_TABLET | Freq: Every day | ORAL | 3 refills | Status: DC

## 2020-04-15 NOTE — Telephone Encounter (Signed)
[facility] [address] [facility] [place], couldn't find one on a [address] in [place]

## 2020-04-15 NOTE — Patient Instructions (Signed)
Medication Instructions:  Your physician has recommended you make the following change in your medication:   Stop Taking HCTZ  Increase Amlodipine to 5 mg Daily  Start Taking Toprol XL 25 mg Daily   *If you need a refill on your cardiac medications before your next appointment, please call your pharmacy*   Lab Work: NONE   If you have labs (blood work) drawn today and your tests are completely normal, you will receive your results only by: Marland Kitchen MyChart Message (if you have MyChart) OR . A paper copy in the mail If you have any lab test that is abnormal or we need to change your treatment, we will call you to review the results.   Testing/Procedures: NONE   Follow-Up: At [facility], you and your health needs are our priority.  As part of our continuing mission to provide you with exceptional heart care, we have created designated Provider Care Teams.  These Care Teams include your primary Cardiologist (physician) and Advanced Practice Providers (APPs -  Physician Assistants and Nurse Practitioners) who all work together to provide you with the care you need, when you need it.  We recommend signing up for the patient portal called "MyChart".  Sign up information is provided on this After Visit Summary.  MyChart is used to connect with patients for Virtual Visits (Telemedicine).  Patients are able to view lab/test results, encounter notes, upcoming appointments, etc.  Non-urgent messages can be sent to your provider as well.   To learn more about what you can do with MyChart, go to [email or web address].    Your next appointment:    After MRI   The format for your next appointment:   In Person  Provider:   Jenkins Rouge, MD   Other Instructions Thank you for choosing [facility]!

## 2020-04-15 NOTE — Telephone Encounter (Signed)
[facility] called and made this pt an appt-I made them aware that if she did not have meter or logs she would need to reschedule. Patient called back and said her meter broke & she had not been keeping up with them, she asked for a new accu-chek guide to be called in [facility] [address] [facility] [place]. I told her we need atleast 2 weeks worth of readings. Patient would call back for an appt

## 2020-04-15 NOTE — Telephone Encounter (Signed)
It [facility], sorry

## 2020-04-15 NOTE — Progress Notes (Signed)
Cardiology Office Note    Date:  04/15/2020   ID:  DAWNN NAM, DOB 26-Jan-1975, MRN [id number]   PCP:  [place]ze, [place]  Cardiologist:  Jenkins Rouge, MD  Advanced Practice Provider:  No care team member to display Electrophysiologist:  None   [id number]   Chief Complaint  Patient presents with  . Chest Pain    History of Present Illness:  Sandra [name] is a 45 y.o. female with history of hypertension, DM, HLD ADHD, bipolar, bladder CA, GERD.  Patient saw Dr. Johnsie Cancel 02/28/2020 for evaluation of chest pain. Lost her husband to [phone] 09/23/2019, daughter died in childbirth in Sep 23, 2022, raising grandson.  Lexiscan Myoview 03/07/2020 low risk study EF 45% visually appeared normal.  03/27/2020 severe hypokinesis/akinesis of the basal mid inferior wall LVEF 50 to 55%, mild LVH.  Dr. Johnsie Cancel recommend cardiac MRI if she was still having chest pain.  Patient was in the ER 3/28 with recurrent chest pain and hyperglycemia.  Asking for pain meds.  glucose 450  repeat 252, was not eating properly.  Troponins negative EKG without acute change.Sodium was 124. Hasn't seen endocrinologist since Sept. Notes from PCP 04/11/20 chest pain and wanted her to go to ED-she went to [facility] ER and was given pain meds.  Patient describes chest pain as  constant. Like a heaviness and stabbing chest pain. Under so much stress taking care of her baby grandson. Walks up/down her road 12-15 times/day, -doesn't make chest pain worse-actually gets better. Notices it mostly at rest. Has lost over 100 lbs in the past 2 years by diet and exercise. Tearful over her losses. See's psychiatrist Wed.    Past Medical History:  Diagnosis Date  . ADHD (attention deficit hyperactivity disorder)   . Allergic rhinitis   . Anemia    hx of  . Bipolar 1 disorder ([place])   . Bladder tumor   . Cancer ([place])    bladder  . Dizziness   . Full dentures   . GERD (gastroesophageal reflux disease)    . Gross hematuria   . Hyperlipidemia   . Hypertension   . Type 2 diabetes mellitus ([place])   . Urgency of urination    dysuria, sui  . Wears glasses     Past Surgical History:  Procedure Laterality Date  . CYSTOSCOPY N/A 10/03/2015   Procedure: CYSTOSCOPY;  Surgeon: Irine Seal, MD;  Location[facility];  Service: Urology;  Laterality: N/A;  . CYSTOSCOPY WITH STENT PLACEMENT Right 10/31/2015   Procedure: CYSTOSCOPY WITH ATTEMPTED RIGHT URETERAL OPENING;  Surgeon: Irine Seal, MD;  Location: [facility];  Service: Urology;  Laterality: Right;  . INTERSTIM IMPLANT PLACEMENT N/A 03/14/2018   Procedure: Barrie Lyme IMPLANT FIRST STAGE;  Surgeon: Cleon Gustin, MD;  Location: [facility];  Service: Urology;  Laterality: N/A;  . INTERSTIM IMPLANT PLACEMENT N/A 03/30/2018   Procedure: Barrie Lyme IMPLANT SECOND STAGE;  Surgeon: Cleon Gustin, MD;  Location: [facility];  Service: Urology;  Laterality: N/A;  . MULTIPLE TOOTH EXTRACTIONS  2015  . OVARIAN CYST REMOVAL Right 2004 approx  . SHOULDER OPEN ROTATOR CUFF REPAIR Left 04/20/2019   Procedure: ROTATOR CUFF REPAIR SHOULDER OPEN;  Surgeon: Carole Civil, MD;  Location: [facility];  Service: Orthopedics;  Laterality: Left;  . TONSILLECTOMY  12/30/2004  . TOTAL ABDOMINAL HYSTERECTOMY W/ BILATERAL SALPINGOOPHORECTOMY  05/16/2004  . TRANSURETHRAL RESECTION OF BLADDER TUMOR N/A 10/03/2015   Procedure:  TRANSURETHRAL RESECTION OF BLADDER TUMOR (TURBT);  Surgeon: Irine Seal, MD;  Location: [facility];  Service: Urology;  Laterality: N/A;  . TRANSURETHRAL RESECTION OF BLADDER TUMOR N/A 10/31/2015   Procedure: RE-STAGINGG TRANSURETHRAL RESECTION OF BLADDER TUMOR (TURBT);  Surgeon: Irine Seal, MD;  Location: [facility];  Service: Urology;  Laterality: N/A;  . TRANSURETHRAL RESECTION OF BLADDER TUMOR N/A 08/17/2016   Procedure: TRANSURETHRAL RESECTION OF BLADDER TUMOR (TURBT);  Surgeon: Cleon Gustin, MD;  Location: [facility];  Service: Urology;  Laterality: N/A;    Current Medications: Current Meds  Medication Sig  . amLODipine (NORVASC) 5 MG tablet Take 1 tablet (5 mg total) by mouth daily.  . Blood Glucose Monitoring Suppl (ACCU-CHEK GUIDE) w/Device KIT 1 Piece by Does not apply route as directed.  . Blood Glucose Monitoring Suppl (ACCU-CHEK GUIDE) w/Device KIT 1 Piece by Does not apply route as directed.  Marland Kitchen buPROPion (WELLBUTRIN XL) 300 MG 24 hr tablet Take 300 mg by mouth every morning.  . cloNIDine HCl (KAPVAY) 0.1 MG TB12 ER tablet Take 0.1 mg by mouth every evening.  . diclofenac (VOLTAREN) 75 MG EC tablet Take 75 mg by mouth 2 (two) times daily.  . fenofibrate (TRICOR) 145 MG tablet Take 145 mg by mouth daily.  Marland Kitchen FLUoxetine (PROZAC) 40 MG capsule Take 40 mg by mouth daily.  . fluticasone (FLONASE) 50 MCG/ACT nasal spray fluticasone propionate 50 mcg/actuation nasal spray,suspension  . gabapentin (NEURONTIN) 300 MG capsule TAKE 1 CAPSULE(300 MG) BY MOUTH THREE TIMES DAILY (Patient taking differently: Take 300 mg by mouth 3 (three) times daily.)  . glipiZIDE (GLUCOTROL XL) 5 MG 24 hr tablet Take 1 tablet (5 mg total) by mouth daily with breakfast.  . glucose blood (ACCU-CHEK GUIDE) test strip Use as instructed  . insulin glargine (LANTUS) 100 UNIT/ML Solostar Pen Inject 24 Units into the skin at bedtime. (Patient taking differently: Inject 30 Units into the skin at bedtime.)  . Insulin Pen Needle (B-D ULTRAFINE III SHORT PEN) 31G X 8 MM MISC 1 each by Does not apply route as directed.  Marland Kitchen levocetirizine (XYZAL) 5 MG tablet Take 5 mg by mouth every evening.  . metFORMIN (GLUCOPHAGE) 1000 MG tablet Take 1 tablet (1,000 mg total) by mouth 2 (two) times daily with a meal.  . metoprolol succinate (TOPROL XL) 25 MG 24 hr tablet Take 1 tablet (25 mg total) by mouth daily.  . montelukast (SINGULAIR) 10 MG tablet Take 10 mg by mouth at bedtime.  . pantoprazole (PROTONIX) 40 MG tablet  Take 40 mg by mouth at bedtime.   . rosuvastatin (CRESTOR) 40 MG tablet Take 40 mg by mouth daily.  Marland Kitchen VYVANSE 40 MG capsule Take 40 mg by mouth every morning.  . [DISCONTINUED] amLODipine (NORVASC) 2.5 MG tablet Take 1 tablet (2.5 mg total) by mouth daily.  . [DISCONTINUED] hydrochlorothiazide (MICROZIDE) 12.5 MG capsule Take 12.5 mg by mouth daily.   Current Facility-Administered Medications for the 04/15/20 encounter (Office Visit) with Imogene Burn, PA-C  Medication  . betamethasone acetate-betamethasone sodium phosphate (CELESTONE) injection 3 mg     Allergies:   Benazepril, Diphenhydramine, Ibuprofen, Metronidazole, Oxycodone, Adhesive [tape], Codeine, Loratadine, Nystatin-triamcinolone, and Penicillins   Social History   Socioeconomic History  . Marital status: Married    Spouse name: Not on file  . Number of children: Not on file  . Years of education: Not on file  . Highest education level: Not on file  Occupational History  .  Not on file  Tobacco Use  . Smoking status: Former Smoker    Packs/day: 0.50    Years: 10.00    Pack years: 5.00    Types: Cigarettes    Quit date: 11/12/2019    Years since quitting: 0.4  . Smokeless tobacco: Never Used  Vaping Use  . Vaping Use: Never used  Substance and Sexual Activity  . Alcohol use: No  . Drug use: No  . Sexual activity: Not Currently    Birth control/protection: Surgical    Comment: hyst  Other Topics Concern  . Not on file  Social History Narrative  . Not on file   Social Determinants of Health   Financial Resource Strain: Not on file  Food Insecurity: Not on file  Transportation Needs: Not on file  Physical Activity: Not on file  Stress: Not on file  Social Connections: Not on file     Family History:  The patient's family history includes Breast cancer in her maternal aunt; Cancer in her mother and another family member; Diabetes in her daughter; Hypertension in her mother; Lung cancer in her maternal  uncle.   ROS:   Please see the history of present illness.    ROS All other systems reviewed and are negative.   PHYSICAL EXAM:   VS:  BP (!) 142/92   Pulse 78   Ht 5' (1.524 m)   Wt 155 lb (70.3 kg)   SpO2 97%   BMI 30.27 kg/m   Physical Exam  GEN: Well nourished, well developed, in no acute distress  Neck: no JVD, carotid bruits, or masses Cardiac:RRR; no murmurs, rubs, or gallops  Respiratory:  clear to auscultation bilaterally, normal work of breathing GI: soft, nontender, nondistended, + BS Ext: without cyanosis, clubbing, or edema, Good distal pulses bilaterally Neuro:  Alert and Oriented x 3 Psych: euthymic mood, full affect  Wt Readings from Last 3 Encounters:  04/15/20 155 lb (70.3 kg)  03/29/20 151 lb (68.5 kg)  02/28/20 154 lb (69.9 kg)      Studies/Labs Reviewed:   EKG:  EKG is not ordered today.  The ekg reviewed from ER 04/08/2020 normal sinus rhythm with right axis deviation, no acute change Recent Labs: 04/08/2020: ALT 24; BUN 14; Creatinine, Ser 0.79; Hemoglobin 15.0; Platelets 380; Potassium 3.9; Sodium 124   Lipid Panel No results found for: CHOL, TRIG, HDL, CHOLHDL, VLDL, LDLCALC, LDLDIRECT  Additional studies/ records that were reviewed today include:  2D echo 03/27/20 IMPRESSIONS     1. There is severe hypokinesis / akinesis of the basal / mid inferior  wall . Left ventricular ejection fraction, by estimation, is 50 to 55%.  The left ventricle has low normal function. There is mild left ventricular  hypertrophy. Left ventricular  diastolic parameters are indeterminate.   2. Right ventricular systolic function is normal. The right ventricular  size is normal.   3. The mitral valve is normal in structure. Trivial mitral valve  regurgitation.   4. The aortic valve is normal in structure. Aortic valve regurgitation is  not visualized.   5. The inferior vena cava is normal in size with greater than 50%  respiratory variability, suggesting right  atrial pressure of 3 mmHg.   NST 03/07/2020  No diagnostic ST segment changes indicate ischemia.  Small, mild intensity, apical septal and inferior basal defects that are partially reversible in the setting of significant breast attenuation and also radiotracer uptake near the inferior wall, most likely artifactual. No large ischemic  territories.  This is a low risk study.  Nuclear stress EF: 45%, visually appears normal. Suggest echocardiogram.      Risk Assessment/Calculations:         ASSESSMENT:    1. Chest pain, unspecified type   2. Uncontrolled type 2 diabetes mellitus with hyperglycemia ([place])   3. Essential hypertension, benign   4. Mixed hyperlipidemia   5. Stress      PLAN:  In order of problems listed above:  Chest pain, constant improves with exercise, mostly with rest, somewhat atypical.  NST 03/07/2020 low risk study small mild intensity apical septal and inferior basal defects partially reversible defect in the setting of significant breast attenuation most likely artifact EF 45%.  Follow-up echo EF 50 to 55% with severe hypokinesis akinesis of the basal mid inferior wall.  MRI ordered by Dr. Johnsie Cancel. A lot of her chest pain revolves around her stress and anxiety of losing her husband and daughter and caring for her grandbaby. Also having some heart racing. Will add toprol XL 25 mg once daily, on amlodipine 5 mg daily. F/U with Dr. Johnsie Cancel after MRI.  Diabetes mellitus uncontrolled- needs to call and make appt with Dr. Dorris Fetch.  Hypertension-BP running high but sodium low 124. Will stop HCTZ and add toprol xl 25 mg once daily to amlodipine.  HLD on statin managed by PCP  Stress/anxiety-seeing her psychiatrist Wed.  Shared Decision Making/Informed Consent        Medication Adjustments/Labs and Tests Ordered: Current medicines are reviewed at length with the patient today.  Concerns regarding medicines are outlined above.  Medication changes, Labs and Tests  ordered today are listed in the Patient Instructions below. Patient Instructions  Medication Instructions:  Your physician has recommended you make the following change in your medication:   Stop Taking HCTZ  Increase Amlodipine to 5 mg Daily  Start Taking Toprol XL 25 mg Daily   *If you need a refill on your cardiac medications before your next appointment, please call your pharmacy*   Lab Work: NONE   If you have labs (blood work) drawn today and your tests are completely normal, you will receive your results only by: Marland Kitchen MyChart Message (if you have MyChart) OR . A paper copy in the mail If you have any lab test that is abnormal or we need to change your treatment, we will call you to review the results.   Testing/Procedures: NONE   Follow-Up: At [facility], you and your health needs are our priority.  As part of our continuing mission to provide you with exceptional heart care, we have created designated Provider Care Teams.  These Care Teams include your primary Cardiologist (physician) and Advanced Practice Providers (APPs -  Physician Assistants and Nurse Practitioners) who all work together to provide you with the care you need, when you need it.  We recommend signing up for the patient portal called "MyChart".  Sign up information is provided on this After Visit Summary.  MyChart is used to connect with patients for Virtual Visits (Telemedicine).  Patients are able to view lab/test results, encounter notes, upcoming appointments, etc.  Non-urgent messages can be sent to your provider as well.   To learn more about what you can do with MyChart, go to [email or web address].    Your next appointment:    After MRI   The format for your next appointment:   In Person  Provider:   Jenkins Rouge, MD   Other Instructions Thank you for choosing  [facility]!       Signed, Ermalinda Barrios, PA-C  04/15/2020 [facility] [place], [place], [place]  [address] Phone: [phone]; Fax: [phone]

## 2020-04-16 ENCOUNTER — Ambulatory Visit: Payer: Medicaid Other | Admitting: Nurse Practitioner

## 2020-04-16 ENCOUNTER — Telehealth: Payer: Self-pay

## 2020-04-16 NOTE — Telephone Encounter (Signed)
Patient called and notified.  Patient has f/u with her PCP and is being referred to endocrinologist.

## 2020-04-16 NOTE — Telephone Encounter (Signed)
-----   Message from Cleon Gustin, MD sent at 04/09/2020 10:47 AM EDT ----- A1c is 14.5. This is what is causing her LUTS, not the interstim. She needs to call her PCP ----- Message ----- From: Dorisann Frames, RN Sent: 04/01/2020  10:49 AM EDT To: Cleon Gustin, MD  Please review

## 2020-05-10 ENCOUNTER — Telehealth (HOSPITAL_COMMUNITY): Payer: Self-pay | Admitting: Emergency Medicine

## 2020-05-10 NOTE — Telephone Encounter (Signed)
Reaching out to patient to offer assistance regarding upcoming cardiac imaging study; pt verbalizes understanding of appt date/time, parking situation and where to check in, and verified current allergies; name and call back number provided for further questions should they arise Marchia Bond RN Navigator Cardiac Imaging [facility] [phone] office [phone] cell  Bladder stimulator, denies claustro  Per melvin- bladder stimulator is MR Conditional

## 2020-05-13 ENCOUNTER — Encounter (HOSPITAL_COMMUNITY): Payer: Self-pay

## 2020-05-13 ENCOUNTER — Ambulatory Visit (HOSPITAL_COMMUNITY): Admission: RE | Admit: 2020-05-13 | Payer: Medicaid Other | Source: Ambulatory Visit

## 2020-05-16 NOTE — Progress Notes (Signed)
Referring Provider: Coolidge Breeze, FNP Primary Care Physician:  Coolidge Breeze, FNP Primary Gastroenterologist:  Dr. Gala Romney  Chief Complaint  Patient presents with  . Dysphagia    Food/pills get stuck. Sometimes has to vomit    HPI:   Sandra [name] is a 45 y.o. female presenting today at the request of Coolidge Breeze, FNP for dysphagia.  She has been seeing cardiology for chest pain evaluation.  It is not felt that a lot of her chest pain revolves around her stress and anxiety of losing her husband and daughter and caring for her grandbaby.  She had a low risk stress test in February 2022 with EF 45%.  Follow-up echocardiogram in March 2022 with severe hypokinesis/akinesis of the basal/mid inferior wall, LVEF 50 to 55%.  Mild left ventricular hypertrophy.  A cardiac MRI has been ordered.  Upcoming appointment with cardiology?  *Hemoglobin A1c 14.16 March 2020.  Today: She reports she has been struggling with dysphagia for quite some time.  She has trouble with food and pills getting stuck in her esophagus and occasionally has to regurgitate these items. Symptoms are daily. No trouble with soft foods. Liquids are ok. Uncontrolled reflux on Protonix 40 mg daily. Symptoms are daily. Has been on omeprazole in the past but insurance wouldn't pay. Drinks soda daily. No fried/fatty/spicy foods.   No prior EGD. No colonoscopy.   No abdominal pain. Occasional constipation, 3-4 days a month. Has to strain. Occasional diarrhea a couple times a week with mushy stools with 2 BMs per day. Diarrhea can be triggered by eating out- for example [place]. Metformin also causes looser stools. History of hemorrhoids that prolapse at times. Intermittent rectal bleeding with blood on stools a couple times a week. This started about 3 months ago. No black stools.  She uses Preparation H for a day or so for her hemorrhoids.   Has interstem in her back for bladder problems. History of bladder cancer in 2018.  Can't have cardiac MRI. No CP currently. Last occurrence was about a week ago. Associated with stress/anxiety. Not associated with activity. Mild SOB but nothing significant. Chronic cough since COVID in October 2021.   States she is taking her diabetes medications as prescribed. She doesn't have a meter to check her sugars. Hasn't seen Dr. Dorris Fetch in quite some time. She called and was told to monitor her BS twice weekly for 2 weeks, then schedule an appointment.    Past Medical History:  Diagnosis Date  . ADHD (attention deficit hyperactivity disorder)   . Allergic rhinitis   . Anemia    hx of  . Bipolar 1 disorder ([place])   . Bladder tumor   . Cancer ([place])    bladder  . Dizziness   . Full dentures   . GERD (gastroesophageal reflux disease)   . Gross hematuria   . Hyperlipidemia   . Hypertension   . Type 2 diabetes mellitus ([place])   . Urgency of urination    dysuria, sui  . Wears glasses     Past Surgical History:  Procedure Laterality Date  . CYSTOSCOPY N/A 10/03/2015   Procedure: CYSTOSCOPY;  Surgeon: Irine Seal, MD;  Location: [facility];  Service: Urology;  Laterality: N/A;  . CYSTOSCOPY WITH STENT PLACEMENT Right 10/31/2015   Procedure: CYSTOSCOPY WITH ATTEMPTED RIGHT URETERAL OPENING;  Surgeon: Irine Seal, MD;  Location: [facility];  Service: Urology;  Laterality: Right;  . INTERSTIM IMPLANT PLACEMENT N/A 03/14/2018  Procedure: INTERSTIM IMPLANT FIRST STAGE;  Surgeon: Cleon Gustin, MD;  Location: [facility];  Service: Urology;  Laterality: N/A;  . INTERSTIM IMPLANT PLACEMENT N/A 03/30/2018   Procedure: [place] IMPLANT SECOND STAGE;  Surgeon: Cleon Gustin, MD;  Location: [facility];  Service: Urology;  Laterality: N/A;  . MULTIPLE TOOTH EXTRACTIONS  2015  . OVARIAN CYST REMOVAL Right 2004 approx  . SHOULDER OPEN ROTATOR CUFF REPAIR Left 04/20/2019   Procedure: ROTATOR CUFF REPAIR SHOULDER OPEN;  Surgeon: Carole Civil, MD;  Location:  [facility];  Service: Orthopedics;  Laterality: Left;  . TONSILLECTOMY  12/30/2004  . TOTAL ABDOMINAL HYSTERECTOMY W/ BILATERAL SALPINGOOPHORECTOMY  05/16/2004  . TRANSURETHRAL RESECTION OF BLADDER TUMOR N/A 10/03/2015   Procedure: TRANSURETHRAL RESECTION OF BLADDER TUMOR (TURBT);  Surgeon: Irine Seal, MD;  Location: [facility];  Service: Urology;  Laterality: N/A;  . TRANSURETHRAL RESECTION OF BLADDER TUMOR N/A 10/31/2015   Procedure: RE-STAGINGG TRANSURETHRAL RESECTION OF BLADDER TUMOR (TURBT);  Surgeon: Irine Seal, MD;  Location: [facility];  Service: Urology;  Laterality: N/A;  . TRANSURETHRAL RESECTION OF BLADDER TUMOR N/A 08/17/2016   Procedure: TRANSURETHRAL RESECTION OF BLADDER TUMOR (TURBT);  Surgeon: Cleon Gustin, MD;  Location: [facility];  Service: Urology;  Laterality: N/A;    Current Outpatient Medications  Medication Sig Dispense Refill  . amLODipine (NORVASC) 5 MG tablet Take 1 tablet (5 mg total) by mouth daily. 90 tablet 3  . Blood Glucose Monitoring Suppl (ACCU-CHEK GUIDE) w/Device KIT 1 Piece by Does not apply route as directed. 1 kit 0  . buPROPion (WELLBUTRIN XL) 300 MG 24 hr tablet Take 300 mg by mouth every morning.    . cloNIDine HCl (KAPVAY) 0.1 MG TB12 ER tablet Take 0.1 mg by mouth every evening.    . diclofenac (VOLTAREN) 75 MG EC tablet Take 75 mg by mouth 2 (two) times daily.    . fenofibrate (TRICOR) 145 MG tablet Take 145 mg by mouth daily.    Marland Kitchen FLUoxetine (PROZAC) 20 MG capsule Take 1 capsule by mouth daily.    Marland Kitchen FLUoxetine (PROZAC) 40 MG capsule Take 40 mg by mouth daily.    . fluticasone (FLONASE) 50 MCG/ACT nasal spray daily.    Marland Kitchen gabapentin (NEURONTIN) 300 MG capsule TAKE 1 CAPSULE(300 MG) BY MOUTH THREE TIMES DAILY (Patient taking differently: Take 300 mg by mouth 3 (three) times daily.) 90 capsule 0  . glipiZIDE (GLUCOTROL XL) 5 MG 24 hr tablet Take 1 tablet (5 mg total) by mouth daily with breakfast. 90 tablet 1  . glucose  blood (ACCU-CHEK GUIDE) test strip Use as instructed to monitor glucose twice daily, before breakfast and before bed. 200 each 2  . hydrocortisone (ANUSOL-HC) 2.5 % rectal cream Place 1 application rectally 2 (two) times daily. 30 g 1  . insulin glargine (LANTUS) 100 UNIT/ML Solostar Pen Inject 24 Units into the skin at bedtime. (Patient taking differently: Inject 30 Units into the skin at bedtime.) 5 pen 3  . Insulin Pen Needle (B-D ULTRAFINE III SHORT PEN) 31G X 8 MM MISC 1 each by Does not apply route as directed. 100 each 3  . levocetirizine (XYZAL) 5 MG tablet Take 5 mg by mouth every evening.    . metFORMIN (GLUCOPHAGE) 1000 MG tablet Take 1 tablet (1,000 mg total) by mouth 2 (two) times daily with a meal. 180 tablet 1  . metoprolol succinate (TOPROL XL) 25 MG 24 hr tablet Take 1 tablet (25 mg total)  by mouth daily. 90 tablet 3  . montelukast (SINGULAIR) 10 MG tablet Take 10 mg by mouth at bedtime.    . pantoprazole (PROTONIX) 40 MG tablet Take 1 tablet (40 mg total) by mouth 2 (two) times daily before a meal. 60 tablet 3  . rosuvastatin (CRESTOR) 40 MG tablet Take 40 mg by mouth daily.    Marland Kitchen VYVANSE 40 MG capsule Take 40 mg by mouth every morning.     Current Facility-Administered Medications  Medication Dose Route Frequency Provider Last Rate Last Admin  . betamethasone acetate-betamethasone sodium phosphate (CELESTONE) injection 3 mg  3 mg Intramuscular Once Edrick Kins, DPM        Allergies as of 05/17/2020 - Review Complete 05/17/2020  Allergen Reaction Noted  . Benazepril Hives 11/25/2014  . Diphenhydramine Hives 04/04/2020  . Ibuprofen Hives 05/12/2016  . Metronidazole Nausea And Vomiting   . Oxycodone  08/19/2016  . Adhesive [tape] Rash 09/30/2015  . Codeine Nausea And Vomiting and Rash   . Loratadine Rash   . Nystatin-triamcinolone Rash 02/24/2018  . Penicillins Rash     Family History  Problem Relation Age of Onset  . Hypertension Mother   . Cancer Mother   .  Breast cancer Maternal Aunt   . Lung cancer Maternal Uncle   . Cancer Other   . Diabetes Daughter   . Colon cancer Neg Hx     Social History   Socioeconomic History  . Marital status: Married    Spouse name: Not on file  . Number of children: Not on file  . Years of education: Not on file  . Highest education level: Not on file  Occupational History  . Not on file  Tobacco Use  . Smoking status: Former Smoker    Packs/day: 0.50    Years: 10.00    Pack years: 5.00    Types: Cigarettes    Quit date: 11/12/2019    Years since quitting: 0.5  . Smokeless tobacco: Never Used  Vaping Use  . Vaping Use: Never used  Substance and Sexual Activity  . Alcohol use: No  . Drug use: No  . Sexual activity: Not Currently    Birth control/protection: Surgical    Comment: hyst  Other Topics Concern  . Not on file  Social History Narrative  . Not on file   Social Determinants of Health   Financial Resource Strain: Not on file  Food Insecurity: Not on file  Transportation Needs: Not on file  Physical Activity: Not on file  Stress: Not on file  Social Connections: Not on file  Intimate Partner Violence: Not on file    Review of Systems: Gen: Denies any fever, chills, cold or flulike symptoms, lightheadedness, dizziness, presyncope, syncope. CV: See HPI Resp: See HPI GI: See HPI GU : Denies urinary burning, urinary frequency, urinary hesitancy MS: Denies joint pain Derm: Denies rash Psych: Admits to depression and anxiety Heme: See HPI  Physical Exam: BP (!) 162/90   Pulse 76   Temp (!) 97.1 F (36.2 C) (Temporal)   Ht 5' (1.524 m)   Wt 155 lb 3.2 oz (70.4 kg)   BMI 30.31 kg/m  General:   Alert and oriented. Pleasant and cooperative. Well-nourished and well-developed.  Head:  Normocephalic and atraumatic. Eyes:  Without icterus, sclera clear and conjunctiva pink.  Ears:  Normal auditory acuity. Lungs:  Clear to auscultation bilaterally. No wheezes, rales, or  rhonchi. No distress.  Heart:  S1, S2 present without murmurs  appreciated.  Abdomen:  +BS, soft, non-tender and non-distended. No HSM noted. No guarding or rebound. No masses appreciated.  Rectal:  Deferred  Msk:  Symmetrical without gross deformities. Normal posture. Extremities:  Without edema. Neurologic:  Alert and  oriented x4;  grossly normal neurologically. Skin:  Intact without significant lesions or rashes. Psych: Normal mood and affect.   Assessment: 45 year old female with history of anxiety, depression, and bipolar disorder, HTN, HLD, uncontrolled diabetes, bladder cancer in 2018 presenting today with dysphagia, GERD, and rectal bleeding.   Dysphagia: Solid food and pill dysphagia with sensation of items getting hung at the sternal notch requiring intermittent regurgitation.  This is in the setting of uncontrolled GERD.  No prior EGD.  Suspect she likely has esophagitis, esophageal web, ring, or stricture.  Less likely but cannot rule out malignancy.  She needs EGD for further evaluation and therapeutic intervention.  However, due to uncontrolled diabetes and inability to monitor her blood sugars, I have asked that she schedule a follow-up with Dr. Dorris Fetch ASAP prior to scheduling.  We discussed dysphagia precautions and ED precautions.   GERD: Chronic.  Uncontrolled on Protonix 40 mg daily.  Also with dysphagia as per above.  We will try her in Protonix 40 mg BID.  She will also need an EGD in the near future due to dysphagia which will help to evaluate for other underlying problems that may be contributing to GERD.  Notably, with uncontrolled diabetes, cannot rule out gastroparesis which may also contributing.  May need to consider evaluating for this in the future.  Rectal bleeding: Intermittent rectal bleeding x3 months.  Symptoms occur a couple times a week.  Patient reports chronic history of hemorrhoids that prolapse at times for which she uses Preparation H.  With this, her  hemorrhoids will reduce.  She has never used Preparation H for more than a couple days at a time.  I suspect rectal bleeding is likely secondary to hemorrhoids; however, as she has never had a colonoscopy, cannot rule out colon polyps or malignancy.  She will need colonoscopy for further evaluation.  However, due to uncontrolled diabetes and inability to monitor her blood glucose, I am concerned about making medication adjustments while on clear liquids as well as anesthesia risk if she were to present with a glucose >300. I have recommended follow-up with Dr. Dorris Fetch ASAP prior to scheduling. We will check a CBC and start her on Anusol rectal cream twice daily. If she is anemic, would need to try to move forward with a colonoscopy as soon as possible.   Uncontrolled diabetes: Hemoglobin A1c 14.5 in March 2020.  She tells me she is taking her medications as prescribed, but does not have a way to check her blood glucose at home.  States she was to receive a glucometer from Dr. Dorris Fetch, but has not received this.  Has not seen Dr. Dorris Fetch in quite some time.  I have advised patient to reach out to Dr. Liliane Channel office today and schedule an appointment ASAP.  Her uncontrolled diabetes is hindering her ability to move forward with procedures.  She needs a way to monitor her blood glucose and better management of diabetes. Will check a fasting glucose with labs I am ordering.  Did not order A1c as this was completed 1.5 months ago.   Plan: 1.  CBC, glucose fasting.  2.  Increase Protonix to 40 mg twice daily 30 minutes before breakfast and dinner.  Prescription sent to pharmacy.  3.  Counseled on GERD diet/lifestyle.  Written instructions provided.  4.  Chopped meats finely.  5.  Eat slowly, take small bites, chew thoroughly, drink plenty of liquids throughout meals.  6.  If something gets stuck in her esophagus and will not come up or go down, she is to proceed to the emergency room.  7.  Anusol rectal cream twice  daily x10-14 days.  8.  Advised patient to call Dr. Liliane Channel office today to schedule an appointment ASAP for uncontrolled diabetes.  9.  Patient needs EGD +/- dilation and colonoscopy with propofol in the very near future for dysphagia, GERD, and rectal bleeding.  However, due to uncontrolled diabetes, we are holding off for now.  If evidence of anemia on labs, will need to consider procedures as soon as possible. Would also need to reach out to cardiology to get the okay considering they have been evaluating her for chest pain.    Aliene Altes, PA-C [facility] 05/17/2020

## 2020-05-17 ENCOUNTER — Other Ambulatory Visit: Payer: Self-pay

## 2020-05-17 ENCOUNTER — Ambulatory Visit (INDEPENDENT_AMBULATORY_CARE_PROVIDER_SITE_OTHER): Payer: Medicaid Other | Admitting: Gastroenterology

## 2020-05-17 ENCOUNTER — Encounter: Payer: Self-pay | Admitting: Gastroenterology

## 2020-05-17 VITALS — BP 162/90 | HR 76 | Temp 97.1°F | Ht 60.0 in | Wt 155.2 lb

## 2020-05-17 DIAGNOSIS — E1110 Type 2 diabetes mellitus with ketoacidosis without coma: Secondary | ICD-10-CM

## 2020-05-17 DIAGNOSIS — K625 Hemorrhage of anus and rectum: Secondary | ICD-10-CM

## 2020-05-17 DIAGNOSIS — E1165 Type 2 diabetes mellitus with hyperglycemia: Secondary | ICD-10-CM | POA: Diagnosis not present

## 2020-05-17 DIAGNOSIS — K219 Gastro-esophageal reflux disease without esophagitis: Secondary | ICD-10-CM | POA: Diagnosis not present

## 2020-05-17 DIAGNOSIS — R1310 Dysphagia, unspecified: Secondary | ICD-10-CM | POA: Insufficient documentation

## 2020-05-17 DIAGNOSIS — Z79899 Other long term (current) drug therapy: Secondary | ICD-10-CM

## 2020-05-17 MED ORDER — PANTOPRAZOLE SODIUM 40 MG PO TBEC
40.0000 mg | DELAYED_RELEASE_TABLET | Freq: Two times a day (BID) | ORAL | 3 refills | Status: DC
Start: 2020-05-17 — End: 2021-02-06

## 2020-05-17 MED ORDER — HYDROCORTISONE (PERIANAL) 2.5 % EX CREA: 1.0000 "application " | TOPICAL_CREAM | Freq: Two times a day (BID) | CUTANEOUS | 1 refills | Status: DC

## 2020-05-17 NOTE — Patient Instructions (Addendum)
Please have blood work completed at [facility].  This needs to be completed FASTING as I am also checking your glucose.   I am unable to check an A1C as you last A1C was only 1.5 months ago.   Please call Dr. Liliane Channel office today to schedule an appointment ASAP and get a blood glucose monitor.  Increase Protonix to 40 mg twice daily 30 minutes before breakfast and dinner.  Use Anusol rectal cream twice daily for the next 10-14 days to help with intermittent rectal bleeding related to hemorrhoids.  Follow a GERD diet:  Avoid fried, fatty, greasy, spicy, citrus foods. Avoid caffeine and carbonated beverages. Avoid chocolate. Try eating 4-6 small meals a day rather than 3 large meals. Do not eat within 3 hours of laying down. Prop head of bed up on wood or bricks to create a 6 inch incline.  To help with your swallowing: Meats should be chopped finely. Eat slowly, take small bites, chew thoroughly, drink plenty of liquids throughout meals. If something gets hung in your esophagus and would not come up or go down, you should proceed to the emergency room.  We will determine urgency of your procedures once I have labs back. You will need to have a way to monitor your blood glucose while prepping for a colonoscopy.   Aliene Altes, PA-C [facility]

## 2020-05-20 NOTE — H&P (View-Only) (Signed)
Cardiology Office Note    Date:  05/20/2020   ID:  Sandra [name], DOB Oct 22, 1975, MRN [id number]   PCP:  [facility] Breeze, [facility]  Cardiologist:  Jenkins Rouge, MD  Advanced Practice Provider:  No care team member to display Electrophysiologist:  None   [id number]   History of Present Illness:   45 y.o. history of HTN, HLD, ADHD, bipolar, bladder CA GERD.  Reactive depression husband died COVID 08/29/19 and daughter died in childbirth 29-Aug-2022 as well She is raising grandson. Chest pain atypical. Myovue 03/07/20 no ischemia ? Inferior infarct EF 45% TTE inferior hypokinesis EF 50-55% SSCP in ER 04/08/20 R/O no acute ECG changes BS 450   Patient describes chest pain as  constant. Like a heaviness and stabbing chest pain. Under so much stress taking care of her baby grandson. Walks up/down her road 12-15 times/day, -doesn't make chest pain worse-actually gets better. Notices it mostly at rest. Has lost over 100 lbs in the past 2 years by diet and exercise. Tearful over her losses. See's psychiatrist    Unable to do cardiac MRI to assess for scar due to urologic Interstim implant   She has had hives with Benazapril   She continues to have chest pain 3-4 x / week Now it is more exertional She is scared that something may happen to her Since she is raising her 41 month old. Discussed options for definitive diagnosis and since she  Is having active chest pains favor cath this week    Past Medical History:  Diagnosis Date  . ADHD (attention deficit hyperactivity disorder)   . Allergic rhinitis   . Anemia    hx of  . Bipolar 1 disorder ([place])   . Bladder tumor   . Cancer ([place])    bladder  . Dizziness   . Full dentures   . GERD (gastroesophageal reflux disease)   . Gross hematuria   . Hyperlipidemia   . Hypertension   . Type 2 diabetes mellitus ([place])   . Urgency of urination    dysuria, sui  . Wears glasses     Past Surgical History:   Procedure Laterality Date  . CYSTOSCOPY N/A 10/03/2015   Procedure: CYSTOSCOPY;  Surgeon: Irine Seal, MD;  Location: [facility];  Service: Urology;  Laterality: N/A;  . CYSTOSCOPY WITH STENT PLACEMENT Right 10/31/2015   Procedure: CYSTOSCOPY WITH ATTEMPTED RIGHT URETERAL OPENING;  Surgeon: Irine Seal, MD;  Location: [facility];  Service: Urology;  Laterality: Right;  . INTERSTIM IMPLANT PLACEMENT N/A 03/14/2018   Procedure: Barrie Lyme IMPLANT FIRST STAGE;  Surgeon: Cleon Gustin, MD;  Location: [facility];  Service: Urology;  Laterality: N/A;  . INTERSTIM IMPLANT PLACEMENT N/A 03/30/2018   Procedure: Barrie Lyme IMPLANT SECOND STAGE;  Surgeon: Cleon Gustin, MD;  Location: [facility];  Service: Urology;  Laterality: N/A;  . MULTIPLE TOOTH EXTRACTIONS  2015  . OVARIAN CYST REMOVAL Right 2004 approx  . SHOULDER OPEN ROTATOR CUFF REPAIR Left 04/20/2019   Procedure: ROTATOR CUFF REPAIR SHOULDER OPEN;  Surgeon: Carole Civil, MD;  Location: [facility];  Service: Orthopedics;  Laterality: Left;  . TONSILLECTOMY  12/30/2004  . TOTAL ABDOMINAL HYSTERECTOMY W/ BILATERAL SALPINGOOPHORECTOMY  05/16/2004  . TRANSURETHRAL RESECTION OF BLADDER TUMOR N/A 10/03/2015   Procedure: TRANSURETHRAL RESECTION OF BLADDER TUMOR (TURBT);  Surgeon: Irine Seal, MD;  Location: [facility];  Service: Urology;  Laterality: N/A;  .  TRANSURETHRAL RESECTION OF BLADDER TUMOR N/A 10/31/2015   Procedure: RE-STAGINGG TRANSURETHRAL RESECTION OF BLADDER TUMOR (TURBT);  Surgeon: John Wrenn, MD;  Location: [facility];  Service: Urology;  Laterality: N/A;  . TRANSURETHRAL RESECTION OF BLADDER TUMOR N/A 08/17/2016   Procedure: TRANSURETHRAL RESECTION OF BLADDER TUMOR (TURBT);  Surgeon: McKenzie, Patrick L, MD;  Location: [facility];  Service: Urology;  Laterality: N/A;    Current Medications: No outpatient medications have been marked as taking for the 05/28/20 encounter  (Appointment) with Rhylen Shaheen C, MD.   Current Facility-Administered Medications for the 05/28/20 encounter (Appointment) with Jarrah Babich C, MD  Medication  . betamethasone acetate-betamethasone sodium phosphate (CELESTONE) injection 3 mg     Allergies:   Benazepril, Diphenhydramine, Ibuprofen, Metronidazole, Oxycodone, Adhesive [tape], Codeine, Loratadine, Nystatin-triamcinolone, and Penicillins   Social History   Socioeconomic History  . Marital status: Married    Spouse name: Not on file  . Number of children: Not on file  . Years of education: Not on file  . Highest education level: Not on file  Occupational History  . Not on file  Tobacco Use  . Smoking status: Former Smoker    Packs/day: 0.50    Years: 10.00    Pack years: 5.00    Types: Cigarettes    Quit date: 11/12/2019    Years since quitting: 0.5  . Smokeless tobacco: Never Used  Vaping Use  . Vaping Use: Never used  Substance and Sexual Activity  . Alcohol use: No  . Drug use: No  . Sexual activity: Not Currently    Birth control/protection: Surgical    Comment: hyst  Other Topics Concern  . Not on file  Social History Narrative  . Not on file   Social Determinants of Health   Financial Resource Strain: Not on file  Food Insecurity: Not on file  Transportation Needs: Not on file  Physical Activity: Not on file  Stress: Not on file  Social Connections: Not on file     Family History:  The patient's family history includes Breast cancer in her maternal aunt; Cancer in her mother and another family member; Diabetes in her daughter; Hypertension in her mother; Lung cancer in her maternal uncle.   ROS:   Please see the history of present illness.    ROS All other systems reviewed and are negative.   PHYSICAL EXAM:   VS:  There were no vitals taken for this visit.  Physical Exam  Depressed  Chronically ill  HEENT: normal Neck supple with no adenopathy JVP normal no bruits no  thyromegaly Lungs clear with no wheezing and good diaphragmatic motion Heart:  S1/S2 no murmur, no rub, gallop or click PMI normal Abdomen: benighn, BS positve, no tenderness, no AAA no bruit.  No HSM or HJR Distal pulses intact with no bruits No edema Neuro non-focal Skin warm and dry No muscular weakness   Wt Readings from Last 3 Encounters:  05/17/20 70.4 kg  04/15/20 70.3 kg  03/29/20 68.5 kg      Studies/Labs Reviewed:   EKG:   SR rate 85 insignificant Q waves inferior lateral leads   Recent Labs: 04/08/2020: ALT 24; BUN 14; Creatinine, Ser 0.79; Hemoglobin 15.0; Platelets 380; Potassium 3.9; Sodium 124   Lipid Panel No results found for: CHOL, TRIG, HDL, CHOLHDL, VLDL, LDLCALC, LDLDIRECT  Additional studies/ records that were reviewed today include:  2D echo 03/27/20 IMPRESSIONS     1. There is severe hypokinesis / akinesis of the   basal / mid inferior  wall . Left ventricular ejection fraction, by estimation, is 50 to 55%.  The left ventricle has low normal function. There is mild left ventricular  hypertrophy. Left ventricular  diastolic parameters are indeterminate.   2. Right ventricular systolic function is normal. The right ventricular  size is normal.   3. The mitral valve is normal in structure. Trivial mitral valve  regurgitation.   4. The aortic valve is normal in structure. Aortic valve regurgitation is  not visualized.   5. The inferior vena cava is normal in size with greater than 50%  respiratory variability, suggesting right atrial pressure of 3 mmHg.   NST 03/07/2020  No diagnostic ST segment changes indicate ischemia.  Small, mild intensity, apical septal and inferior basal defects that are partially reversible in the setting of significant breast attenuation and also radiotracer uptake near the inferior wall, most likely artifactual. No large ischemic territories.  This is a low risk study.  Nuclear stress EF: 45%, visually appears normal.  Suggest echocardiogram.     ASSESSMENT:    Chest Pain   PLAN:  In order of problems listed above:  Chest pain:  Atypical but multiple risk factors including poorly controlled DM. Echo and myovue suggest inferior wall motion abnormality with depressed EF. Discussed options and since chest pain is frequent and she is sole caregiver for her 9 month old grandson favor Diagnostic cath this week. Limited contrast due to shortage and no LV gram since we have Echo Risks including stroke, MI, bleeding need for CABG discussed willing to proceed She has had contrast before LABS done cath lab contacted orders written   DM:  Poorly controlled A1c 14.5 03/29/20 f/u Dr Nida   HTN:  Improved with Toprol continue add ARB given DM and low EF  HLD:  Continue statin check lipid and liver   Depression:  Related to death of husband and daughter f/u psychiatry Prozac    Time:  Spent reviewing chart , myovue echo direct patient interview composing note and arranging Heart cath with orders 45 minutes   Medication Adjustments/Labs and Tests Ordered: Current medicines are reviewed at length with the patient today.  Concerns regarding medicines are outlined above.  Medication changes, Labs and Tests ordered today are listed in the Patient Instructions below. There are no Patient Instructions on file for this visit.   Signed, Jerryl Holzhauer, MD  05/20/2020 5:26 PM    [facility] [address], [place], [place]  [address] Phone: [phone]; Fax: [phone]    

## 2020-05-20 NOTE — Progress Notes (Signed)
Cardiology Office Note    Date:  05/20/2020   ID:  Sandra [name], DOB Oct 22, 1975, MRN [id number]   PCP:  [facility] Breeze, [facility]  Cardiologist:  Jenkins Rouge, MD  Advanced Practice Provider:  No care team member to display Electrophysiologist:  None   [id number]}   History of Present Illness:   45 y.o. history of HTN, HLD, ADHD, bipolar, bladder CA GERD.  Reactive depression husband died COVID 08/29/19 and daughter died in childbirth 29-Aug-2022 as well She is raising grandson. Chest pain atypical. Myovue 03/07/20 no ischemia ? Inferior infarct EF 45% TTE inferior hypokinesis EF 50-55% SSCP in ER 04/08/20 R/O no acute ECG changes BS 450   Patient describes chest pain as  constant. Like a heaviness and stabbing chest pain. Under so much stress taking care of her baby grandson. Walks up/down her road 12-15 times/day, -doesn't make chest pain worse-actually gets better. Notices it mostly at rest. Has lost over 100 lbs in the past 2 years by diet and exercise. Tearful over her losses. See's psychiatrist    Unable to do cardiac MRI to assess for scar due to urologic Interstim implant   She has had hives with Benazapril   She continues to have chest pain 3-4 x / week Now it is more exertional She is scared that something may happen to her Since she is raising her 41 month old. Discussed options for definitive diagnosis and since she  Is having active chest pains favor cath this week    Past Medical History:  Diagnosis Date  . ADHD (attention deficit hyperactivity disorder)   . Allergic rhinitis   . Anemia    hx of  . Bipolar 1 disorder ([place])   . Bladder tumor   . Cancer ([place])    bladder  . Dizziness   . Full dentures   . GERD (gastroesophageal reflux disease)   . Gross hematuria   . Hyperlipidemia   . Hypertension   . Type 2 diabetes mellitus ([place])   . Urgency of urination    dysuria, sui  . Wears glasses     Past Surgical History:   Procedure Laterality Date  . CYSTOSCOPY N/A 10/03/2015   Procedure: CYSTOSCOPY;  Surgeon: Irine Seal, MD;  Location: [facility];  Service: Urology;  Laterality: N/A;  . CYSTOSCOPY WITH STENT PLACEMENT Right 10/31/2015   Procedure: CYSTOSCOPY WITH ATTEMPTED RIGHT URETERAL OPENING;  Surgeon: Irine Seal, MD;  Location: [facility];  Service: Urology;  Laterality: Right;  . INTERSTIM IMPLANT PLACEMENT N/A 03/14/2018   Procedure: Barrie Lyme IMPLANT FIRST STAGE;  Surgeon: Cleon Gustin, MD;  Location: [facility];  Service: Urology;  Laterality: N/A;  . INTERSTIM IMPLANT PLACEMENT N/A 03/30/2018   Procedure: Barrie Lyme IMPLANT SECOND STAGE;  Surgeon: Cleon Gustin, MD;  Location: [facility];  Service: Urology;  Laterality: N/A;  . MULTIPLE TOOTH EXTRACTIONS  2015  . OVARIAN CYST REMOVAL Right 2004 approx  . SHOULDER OPEN ROTATOR CUFF REPAIR Left 04/20/2019   Procedure: ROTATOR CUFF REPAIR SHOULDER OPEN;  Surgeon: Carole Civil, MD;  Location: [facility];  Service: Orthopedics;  Laterality: Left;  . TONSILLECTOMY  12/30/2004  . TOTAL ABDOMINAL HYSTERECTOMY W/ BILATERAL SALPINGOOPHORECTOMY  05/16/2004  . TRANSURETHRAL RESECTION OF BLADDER TUMOR N/A 10/03/2015   Procedure: TRANSURETHRAL RESECTION OF BLADDER TUMOR (TURBT);  Surgeon: Irine Seal, MD;  Location: [facility];  Service: Urology;  Laterality: N/A;  .  TRANSURETHRAL RESECTION OF BLADDER TUMOR N/A 10/31/2015   Procedure: RE-STAGINGG TRANSURETHRAL RESECTION OF BLADDER TUMOR (TURBT);  Surgeon: Irine Seal, MD;  Location: [facility];  Service: Urology;  Laterality: N/A;  . TRANSURETHRAL RESECTION OF BLADDER TUMOR N/A 08/17/2016   Procedure: TRANSURETHRAL RESECTION OF BLADDER TUMOR (TURBT);  Surgeon: Cleon Gustin, MD;  Location: [facility];  Service: Urology;  Laterality: N/A;    Current Medications: No outpatient medications have been marked as taking for the 05/28/20 encounter  (Appointment) with Josue Hector, MD.   Current Facility-Administered Medications for the 05/28/20 encounter (Appointment) with Josue Hector, MD  Medication  . betamethasone acetate-betamethasone sodium phosphate (CELESTONE) injection 3 mg     Allergies:   Benazepril, Diphenhydramine, Ibuprofen, Metronidazole, Oxycodone, Adhesive [tape], Codeine, Loratadine, Nystatin-triamcinolone, and Penicillins   Social History   Socioeconomic History  . Marital status: Married    Spouse name: Not on file  . Number of children: Not on file  . Years of education: Not on file  . Highest education level: Not on file  Occupational History  . Not on file  Tobacco Use  . Smoking status: Former Smoker    Packs/day: 0.50    Years: 10.00    Pack years: 5.00    Types: Cigarettes    Quit date: 11/12/2019    Years since quitting: 0.5  . Smokeless tobacco: Never Used  Vaping Use  . Vaping Use: Never used  Substance and Sexual Activity  . Alcohol use: No  . Drug use: No  . Sexual activity: Not Currently    Birth control/protection: Surgical    Comment: hyst  Other Topics Concern  . Not on file  Social History Narrative  . Not on file   Social Determinants of Health   Financial Resource Strain: Not on file  Food Insecurity: Not on file  Transportation Needs: Not on file  Physical Activity: Not on file  Stress: Not on file  Social Connections: Not on file     Family History:  The patient's family history includes Breast cancer in her maternal aunt; Cancer in her mother and another family member; Diabetes in her daughter; Hypertension in her mother; Lung cancer in her maternal uncle.   ROS:   Please see the history of present illness.    ROS All other systems reviewed and are negative.   PHYSICAL EXAM:   VS:  There were no vitals taken for this visit.  Physical Exam  Depressed  Chronically ill  HEENT: normal Neck supple with no adenopathy JVP normal no bruits no  thyromegaly Lungs clear with no wheezing and good diaphragmatic motion Heart:  S1/S2 no murmur, no rub, gallop or click PMI normal Abdomen: benighn, BS positve, no tenderness, no AAA no bruit.  No HSM or HJR Distal pulses intact with no bruits No edema Neuro non-focal Skin warm and dry No muscular weakness   Wt Readings from Last 3 Encounters:  05/17/20 70.4 kg  04/15/20 70.3 kg  03/29/20 68.5 kg      Studies/Labs Reviewed:   EKG:   SR rate 85 insignificant Q waves inferior lateral leads   Recent Labs: 04/08/2020: ALT 24; BUN 14; Creatinine, Ser 0.79; Hemoglobin 15.0; Platelets 380; Potassium 3.9; Sodium 124   Lipid Panel No results found for: CHOL, TRIG, HDL, CHOLHDL, VLDL, LDLCALC, LDLDIRECT  Additional studies/ records that were reviewed today include:  2D echo 03/27/20 IMPRESSIONS     1. There is severe hypokinesis / akinesis of the  basal / mid inferior  wall . Left ventricular ejection fraction, by estimation, is 50 to 55%.  The left ventricle has low normal function. There is mild left ventricular  hypertrophy. Left ventricular  diastolic parameters are indeterminate.   2. Right ventricular systolic function is normal. The right ventricular  size is normal.   3. The mitral valve is normal in structure. Trivial mitral valve  regurgitation.   4. The aortic valve is normal in structure. Aortic valve regurgitation is  not visualized.   5. The inferior vena cava is normal in size with greater than 50%  respiratory variability, suggesting right atrial pressure of 3 mmHg.   NST 03/07/2020  No diagnostic ST segment changes indicate ischemia.  Small, mild intensity, apical septal and inferior basal defects that are partially reversible in the setting of significant breast attenuation and also radiotracer uptake near the inferior wall, most likely artifactual. No large ischemic territories.  This is a low risk study.  Nuclear stress EF: 45%, visually appears normal.  Suggest echocardiogram.     ASSESSMENT:    Chest Pain   PLAN:  In order of problems listed above:  Chest pain:  Atypical but multiple risk factors including poorly controlled DM. Echo and myovue suggest inferior wall motion abnormality with depressed EF. Discussed options and since chest pain is frequent and she is sole caregiver for her 46 month old grandson favor Diagnostic cath this week. Limited contrast due to shortage and no LV gram since we have Echo Risks including stroke, MI, bleeding need for CABG discussed willing to proceed She has had contrast before LABS done cath lab contacted orders written   DM:  Poorly controlled A1c 14.5 03/29/20 f/u Dr Dorris Fetch   HTN:  Improved with Toprol continue add ARB given DM and low EF  HLD:  Continue statin check lipid and liver   Depression:  Related to death of husband and daughter f/u psychiatry Prozac    Time:  Spent reviewing chart , myovue echo direct patient interview composing note and arranging Heart cath with orders 45 minutes   Medication Adjustments/Labs and Tests Ordered: Current medicines are reviewed at length with the patient today.  Concerns regarding medicines are outlined above.  Medication changes, Labs and Tests ordered today are listed in the Patient Instructions below. There are no Patient Instructions on file for this visit.   Signed, Jenkins Rouge, MD  05/20/2020 5:26 PM    [facility] [place], [place], [place]  [address] Phone: [phone]; Fax: [phone]

## 2020-05-22 NOTE — Progress Notes (Signed)
Cc'ed to pcp °

## 2020-05-23 ENCOUNTER — Telehealth: Payer: Self-pay | Admitting: Gastroenterology

## 2020-05-23 NOTE — Telephone Encounter (Signed)
Can we call patient to remind her about having labs completed ASAP? I saw her in office last week.    Has she called Dr. Dorris Fetch to get a glucose meter and schedule an appointment? I do not see anything in the system yet.

## 2020-05-23 NOTE — Telephone Encounter (Signed)
Called Sandra [name].  Reminded her to have labs drawn ASAP.  Sandra [name] apologized and said she had been real busy but would go today or tomorrow.    She said that Dr. Liliane Channel office told her that she had to have 1 week of blood sugar readings before scheduling an appointment with them.  Sandra [name] said that they told her that they would send in RX for supplies needed to help her check blood sugar.   She said that she hasn't heard back from them.  I informed her that it looks like they sent in a RX for supplies.  She said that she was unaware of it but will call her pharmacy.

## 2020-05-28 ENCOUNTER — Encounter: Payer: Self-pay | Admitting: Cardiovascular Disease

## 2020-05-28 ENCOUNTER — Encounter: Payer: Self-pay | Admitting: *Deleted

## 2020-05-28 ENCOUNTER — Ambulatory Visit (INDEPENDENT_AMBULATORY_CARE_PROVIDER_SITE_OTHER): Payer: Medicaid Other | Admitting: Cardiovascular Disease

## 2020-05-28 ENCOUNTER — Other Ambulatory Visit: Payer: Self-pay | Admitting: Cardiovascular Disease

## 2020-05-28 ENCOUNTER — Other Ambulatory Visit: Payer: Self-pay

## 2020-05-28 VITALS — BP 138/84 | HR 86 | Ht 60.0 in | Wt 155.0 lb

## 2020-05-28 DIAGNOSIS — R9439 Abnormal result of other cardiovascular function study: Secondary | ICD-10-CM | POA: Diagnosis not present

## 2020-05-28 DIAGNOSIS — Z01818 Encounter for other preprocedural examination: Secondary | ICD-10-CM

## 2020-05-28 DIAGNOSIS — R079 Chest pain, unspecified: Secondary | ICD-10-CM | POA: Diagnosis not present

## 2020-05-28 DIAGNOSIS — Z01812 Encounter for preprocedural laboratory examination: Secondary | ICD-10-CM

## 2020-05-28 DIAGNOSIS — I10 Essential (primary) hypertension: Secondary | ICD-10-CM | POA: Diagnosis not present

## 2020-05-28 DIAGNOSIS — E7801 Familial hypercholesterolemia: Secondary | ICD-10-CM

## 2020-05-28 MED ORDER — SODIUM CHLORIDE 0.9% FLUSH: 3.0000 mL | Freq: Two times a day (BID) | INTRAVENOUS | Status: DC

## 2020-05-28 NOTE — Patient Instructions (Addendum)
Medication Instructions:  Continue all current medications.  Labwork: CBC, BMET - orders given today.  Testing/Procedures: Your physician has requested that you have a cardiac catheterization. Cardiac catheterization is used to diagnose and/or treat various heart conditions. Doctors may recommend this procedure for a number of different reasons. The most common reason is to evaluate chest pain. Chest pain can be a symptom of coronary artery disease (CAD), and cardiac catheterization can show whether plaque is narrowing or blocking your heart's arteries. This procedure is also used to evaluate the valves, as well as measure the blood flow and oxygen levels in different parts of your heart. For further information please visit www.cardiosmart.org. Please follow instruction sheet, as given.  Follow-Up: 1 month   Any Other Special Instructions Will Be Listed Below (If Applicable).  If you need a refill on your cardiac medications before your next appointment, please call your pharmacy.  

## 2020-05-29 ENCOUNTER — Telehealth: Payer: Self-pay

## 2020-05-29 ENCOUNTER — Other Ambulatory Visit (HOSPITAL_COMMUNITY)
Admission: RE | Admit: 2020-05-29 | Discharge: 2020-05-29 | Disposition: A | Discharge status: Home / Self Care | Payer: Medicaid Other | Source: Ambulatory Visit | Attending: Gastroenterology | Admitting: Gastroenterology

## 2020-05-29 ENCOUNTER — Other Ambulatory Visit (HOSPITAL_COMMUNITY)
Admission: RE | Admit: 2020-05-29 | Discharge: 2020-05-29 | Disposition: A | Discharge status: Home / Self Care | Payer: Medicaid Other | Source: Ambulatory Visit | Attending: Cardiovascular Disease | Admitting: Cardiovascular Disease

## 2020-05-29 DIAGNOSIS — I10 Essential (primary) hypertension: Secondary | ICD-10-CM | POA: Insufficient documentation

## 2020-05-29 DIAGNOSIS — R079 Chest pain, unspecified: Secondary | ICD-10-CM | POA: Insufficient documentation

## 2020-05-29 DIAGNOSIS — Z01812 Encounter for preprocedural laboratory examination: Secondary | ICD-10-CM | POA: Diagnosis present

## 2020-05-29 DIAGNOSIS — E1110 Type 2 diabetes mellitus with ketoacidosis without coma: Secondary | ICD-10-CM | POA: Diagnosis present

## 2020-05-29 DIAGNOSIS — K625 Hemorrhage of anus and rectum: Secondary | ICD-10-CM | POA: Diagnosis present

## 2020-05-29 DIAGNOSIS — Z79899 Other long term (current) drug therapy: Secondary | ICD-10-CM | POA: Diagnosis present

## 2020-05-29 LAB — CBC WITH DIFFERENTIAL/PLATELET
Abs Immature Granulocytes: 0.03 10*3/uL (ref 0.00–0.07)
Basophils Absolute: 0 10*3/uL (ref 0.0–0.1)
Basophils Relative: 1 %
Eosinophils Absolute: 0.1 10*3/uL (ref 0.0–0.5)
Eosinophils Relative: 1 %
HCT: 45.3 % (ref 36.0–46.0)
Hemoglobin: 14.3 g/dL (ref 12.0–15.0)
Immature Granulocytes: 0 %
Lymphocytes Relative: 36 %
Lymphs Abs: 3.1 10*3/uL (ref 0.7–4.0)
MCH: 26.4 pg (ref 26.0–34.0)
MCHC: 31.6 g/dL (ref 30.0–36.0)
MCV: 83.7 fL (ref 80.0–100.0)
Monocytes Absolute: 0.5 10*3/uL (ref 0.1–1.0)
Monocytes Relative: 6 %
Neutro Abs: 4.7 10*3/uL (ref 1.7–7.7)
Neutrophils Relative %: 56 %
Platelets: 383 10*3/uL (ref 150–400)
RBC: 5.41 MIL/uL — ABNORMAL HIGH (ref 3.87–5.11)
RDW: 13.4 % (ref 11.5–15.5)
WBC: 8.5 10*3/uL (ref 4.0–10.5)
nRBC: 0 % (ref 0.0–0.2)

## 2020-05-29 LAB — BASIC METABOLIC PANEL
Anion gap: 9 (ref 5–15)
BUN: 11 mg/dL (ref 6–20)
CO2: 25 mmol/L (ref 22–32)
Calcium: 9.4 mg/dL (ref 8.9–10.3)
Chloride: 99 mmol/L (ref 98–111)
Creatinine, Ser: 0.64 mg/dL (ref 0.44–1.00)
GFR, Estimated: >60 mL/min (ref >60)
Glucose, Bld: 374 mg/dL — ABNORMAL HIGH (ref 70–99)
Potassium: 4 mmol/L (ref 3.5–5.1)
Sodium: 133 mmol/L — ABNORMAL LOW (ref 135–145)

## 2020-05-29 NOTE — Telephone Encounter (Signed)
-----   Message from Josue Hector, MD sent at 05/29/2020  1:58 PM EDT ----- K/Cr ok BS too high f/u primary

## 2020-05-29 NOTE — Telephone Encounter (Signed)
Pt voiced understanding. Had no questions or concerns at this point.

## 2020-05-30 ENCOUNTER — Telehealth: Payer: Self-pay | Admitting: *Deleted

## 2020-05-30 NOTE — Telephone Encounter (Addendum)
Pt contacted pre-catheterization scheduled at [facility] for: Friday May 31, 2020 7:30 AM Verified arrival time and place: [facility]) at: 5:30 AM   No solid food after midnight prior to cath, clear liquids until 5 AM day of procedure.  Hold: Glipizide-AM of procedure Metformin-day of procedure and 48 hours post procedure. Insulin-AM of procedure/1/2 usual Insulin HS prior to procedure  Except hold medications usual AM can be  taken pre-cath with sips of water including: ASA 81 mg   Confirmed patient has responsible adult to drive home post procedure and be with patient first 24 hours after arriving home: yes  You are allowed ONE visitor in the waiting room during the time you are at the hospital for your procedure. Both you and your visitor must wear a mask once you enter the hospital.    Reviewed procedure/mask/visitor instructions reviewed with patient.

## 2020-05-31 ENCOUNTER — Encounter (HOSPITAL_COMMUNITY)
Admission: RE | Disposition: A | Discharge status: Home / Self Care | Payer: Self-pay | Source: Home / Self Care | Attending: Cardiovascular Disease

## 2020-05-31 ENCOUNTER — Ambulatory Visit (HOSPITAL_COMMUNITY)
Admission: RE | Admit: 2020-05-31 | Discharge: 2020-05-31 | Disposition: A | Discharge status: Home / Self Care | Payer: Medicaid Other | Attending: Cardiovascular Disease | Admitting: Cardiovascular Disease

## 2020-05-31 ENCOUNTER — Other Ambulatory Visit: Payer: Self-pay

## 2020-05-31 ENCOUNTER — Encounter (HOSPITAL_COMMUNITY): Payer: Self-pay | Admitting: Cardiovascular Disease

## 2020-05-31 DIAGNOSIS — Z886 Allergy status to analgesic agent status: Secondary | ICD-10-CM | POA: Insufficient documentation

## 2020-05-31 DIAGNOSIS — I10 Essential (primary) hypertension: Secondary | ICD-10-CM | POA: Insufficient documentation

## 2020-05-31 DIAGNOSIS — Z8249 Family history of ischemic heart disease and other diseases of the circulatory system: Secondary | ICD-10-CM | POA: Insufficient documentation

## 2020-05-31 DIAGNOSIS — Z87891 Personal history of nicotine dependence: Secondary | ICD-10-CM | POA: Diagnosis not present

## 2020-05-31 DIAGNOSIS — E785 Hyperlipidemia, unspecified: Secondary | ICD-10-CM | POA: Insufficient documentation

## 2020-05-31 DIAGNOSIS — Z801 Family history of malignant neoplasm of trachea, bronchus and lung: Secondary | ICD-10-CM | POA: Diagnosis not present

## 2020-05-31 DIAGNOSIS — Z885 Allergy status to narcotic agent status: Secondary | ICD-10-CM | POA: Diagnosis not present

## 2020-05-31 DIAGNOSIS — Z888 Allergy status to other drugs, medicaments and biological substances status: Secondary | ICD-10-CM | POA: Diagnosis not present

## 2020-05-31 DIAGNOSIS — I25119 Atherosclerotic heart disease of native coronary artery with unspecified angina pectoris: Secondary | ICD-10-CM | POA: Insufficient documentation

## 2020-05-31 DIAGNOSIS — Z881 Allergy status to other antibiotic agents status: Secondary | ICD-10-CM | POA: Diagnosis not present

## 2020-05-31 DIAGNOSIS — Z634 Disappearance and death of family member: Secondary | ICD-10-CM | POA: Insufficient documentation

## 2020-05-31 DIAGNOSIS — R9439 Abnormal result of other cardiovascular function study: Secondary | ICD-10-CM | POA: Diagnosis not present

## 2020-05-31 DIAGNOSIS — Z833 Family history of diabetes mellitus: Secondary | ICD-10-CM | POA: Diagnosis not present

## 2020-05-31 DIAGNOSIS — E1165 Type 2 diabetes mellitus with hyperglycemia: Secondary | ICD-10-CM | POA: Insufficient documentation

## 2020-05-31 DIAGNOSIS — Z803 Family history of malignant neoplasm of breast: Secondary | ICD-10-CM | POA: Insufficient documentation

## 2020-05-31 DIAGNOSIS — I2510 Atherosclerotic heart disease of native coronary artery without angina pectoris: Secondary | ICD-10-CM | POA: Diagnosis not present

## 2020-05-31 DIAGNOSIS — Z880 Allergy status to penicillin: Secondary | ICD-10-CM | POA: Insufficient documentation

## 2020-05-31 DIAGNOSIS — Z90722 Acquired absence of ovaries, bilateral: Secondary | ICD-10-CM | POA: Diagnosis not present

## 2020-05-31 DIAGNOSIS — Z8551 Personal history of malignant neoplasm of bladder: Secondary | ICD-10-CM | POA: Diagnosis not present

## 2020-05-31 DIAGNOSIS — R079 Chest pain, unspecified: Secondary | ICD-10-CM | POA: Diagnosis present

## 2020-05-31 DIAGNOSIS — Z90710 Acquired absence of both cervix and uterus: Secondary | ICD-10-CM | POA: Insufficient documentation

## 2020-05-31 HISTORY — PX: LEFT HEART CATH AND CORONARY ANGIOGRAPHY: CATH118249

## 2020-05-31 LAB — GLUCOSE, CAPILLARY
Glucose-Capillary: 281 mg/dL — ABNORMAL HIGH (ref 70–99)
Glucose-Capillary: 249 mg/dL — ABNORMAL HIGH (ref 70–99)

## 2020-05-31 SURGERY — LEFT HEART CATH AND CORONARY ANGIOGRAPHY
Anesthesia: LOCAL

## 2020-05-31 MED ORDER — SODIUM CHLORIDE 0.9 % IV SOLN: 250.0000 mL | INTRAVENOUS | Status: DC | PRN

## 2020-05-31 MED ORDER — SODIUM CHLORIDE 0.9% FLUSH: 3.0000 mL | INTRAVENOUS | Status: DC | PRN

## 2020-05-31 MED ORDER — FENTANYL CITRATE (PF) 100 MCG/2ML IJ SOLN
INTRAMUSCULAR | Status: DC | PRN
  Administered 2020-05-31: 50 ug via INTRAVENOUS
  Administered 2020-05-31 (×2): 25 ug via INTRAVENOUS

## 2020-05-31 MED ORDER — HYDRALAZINE HCL 20 MG/ML IJ SOLN
INTRAMUSCULAR | Status: DC | PRN
  Administered 2020-05-31 (×2): 5 mg via INTRAVENOUS

## 2020-05-31 MED ORDER — HEPARIN SODIUM (PORCINE) 1000 UNIT/ML IJ SOLN
INTRAMUSCULAR | Status: AC
  Filled 2020-05-31: qty 1

## 2020-05-31 MED ORDER — HYDRALAZINE HCL 20 MG/ML IJ SOLN
INTRAMUSCULAR | Status: AC
  Filled 2020-05-31: qty 1

## 2020-05-31 MED ORDER — HEPARIN (PORCINE) IN NACL 1000-0.9 UT/500ML-% IV SOLN
INTRAVENOUS | Status: AC
  Filled 2020-05-31: qty 1000

## 2020-05-31 MED ORDER — HEPARIN (PORCINE) IN NACL 1000-0.9 UT/500ML-% IV SOLN
INTRAVENOUS | Status: DC | PRN
  Administered 2020-05-31 (×2): 500 mL

## 2020-05-31 MED ORDER — METOPROLOL TARTRATE 5 MG/5ML IV SOLN
INTRAVENOUS | Status: DC | PRN
  Administered 2020-05-31: 2.5 mg via INTRAVENOUS

## 2020-05-31 MED ORDER — ONDANSETRON HCL 4 MG/2ML IJ SOLN: 4.0000 mg | Freq: Four times a day (QID) | INTRAMUSCULAR | Status: DC | PRN

## 2020-05-31 MED ORDER — DIAZEPAM 5 MG PO TABS: 5.0000 mg | ORAL_TABLET | ORAL | Status: DC | PRN

## 2020-05-31 MED ORDER — ASPIRIN 81 MG PO CHEW: 81.0000 mg | CHEWABLE_TABLET | Freq: Every day | ORAL | Status: DC

## 2020-05-31 MED ORDER — VERAPAMIL HCL 2.5 MG/ML IV SOLN
INTRAVENOUS | Status: DC | PRN
  Administered 2020-05-31: 2 mg via INTRAVENOUS

## 2020-05-31 MED ORDER — SODIUM CHLORIDE 0.9 % IV SOLN: INTRAVENOUS | Status: DC

## 2020-05-31 MED ORDER — HEPARIN SODIUM (PORCINE) 1000 UNIT/ML IJ SOLN
INTRAMUSCULAR | Status: DC | PRN
  Administered 2020-05-31: 3500 [IU] via INTRAVENOUS

## 2020-05-31 MED ORDER — VERAPAMIL HCL 2.5 MG/ML IV SOLN
INTRAVENOUS | Status: DC | PRN
  Administered 2020-05-31: 10 mL via INTRA_ARTERIAL

## 2020-05-31 MED ORDER — SODIUM CHLORIDE 0.9 % WEIGHT BASED INFUSION
3.0000 mL/kg/h | INTRAVENOUS | Status: AC
  Administered 2020-05-31: 3 mL/kg/h via INTRAVENOUS

## 2020-05-31 MED ORDER — LIDOCAINE HCL (PF) 1 % IJ SOLN
INTRAMUSCULAR | Status: AC
  Filled 2020-05-31: qty 30

## 2020-05-31 MED ORDER — MIDAZOLAM HCL 2 MG/2ML IJ SOLN
INTRAMUSCULAR | Status: AC
  Filled 2020-05-31: qty 2

## 2020-05-31 MED ORDER — HYDRALAZINE HCL 20 MG/ML IJ SOLN: 10.0000 mg | INTRAMUSCULAR | Status: DC | PRN

## 2020-05-31 MED ORDER — FENTANYL CITRATE (PF) 100 MCG/2ML IJ SOLN
INTRAMUSCULAR | Status: AC
  Filled 2020-05-31: qty 2

## 2020-05-31 MED ORDER — SODIUM CHLORIDE 0.9% FLUSH: 3.0000 mL | Freq: Two times a day (BID) | INTRAVENOUS | Status: DC

## 2020-05-31 MED ORDER — LIDOCAINE HCL (PF) 1 % IJ SOLN
INTRAMUSCULAR | Status: DC | PRN
  Administered 2020-05-31: 2 mL

## 2020-05-31 MED ORDER — MIDAZOLAM HCL 2 MG/2ML IJ SOLN
INTRAMUSCULAR | Status: DC | PRN
  Administered 2020-05-31: 1 mg via INTRAVENOUS
  Administered 2020-05-31: 2 mg via INTRAVENOUS
  Administered 2020-05-31: 1 mg via INTRAVENOUS

## 2020-05-31 MED ORDER — SODIUM CHLORIDE 0.9 % WEIGHT BASED INFUSION: 1.0000 mL/kg/h | INTRAVENOUS | Status: DC

## 2020-05-31 MED ORDER — LABETALOL HCL 5 MG/ML IV SOLN: 10.0000 mg | INTRAVENOUS | Status: DC | PRN

## 2020-05-31 MED ORDER — METOPROLOL TARTRATE 5 MG/5ML IV SOLN
INTRAVENOUS | Status: AC
  Filled 2020-05-31: qty 5

## 2020-05-31 MED ORDER — IOHEXOL 350 MG/ML SOLN
INTRAVENOUS | Status: DC | PRN
  Administered 2020-05-31: 47 mL via INTRA_ARTERIAL

## 2020-05-31 MED ORDER — ACETAMINOPHEN 325 MG PO TABS: 650.0000 mg | ORAL_TABLET | ORAL | Status: DC | PRN

## 2020-05-31 MED ORDER — ASPIRIN 81 MG PO CHEW
81.0000 mg | CHEWABLE_TABLET | ORAL | Status: AC
  Administered 2020-05-31: 81 mg via ORAL
  Filled 2020-05-31: qty 1

## 2020-05-31 MED ORDER — VERAPAMIL HCL 2.5 MG/ML IV SOLN
INTRAVENOUS | Status: AC
  Filled 2020-05-31: qty 2

## 2020-05-31 SURGICAL SUPPLY — 13 items
CATH 5FR JL3.5 JR4 ANG PIG MP (CATHETERS) ×2 IMPLANT
CATH LAUNCHER 5F 3DRIGHT (CATHETERS) ×1 IMPLANT
CATH LAUNCHER 5F JR4 (CATHETERS) ×4 IMPLANT
CATH OPTITORQUE TIG 4.0 5F (CATHETERS) ×2 IMPLANT
CATHETER LAUNCHER 5F 3DRIGHT (CATHETERS) ×2
DEVICE RAD COMP TR BAND LRG (VASCULAR PRODUCTS) ×2 IMPLANT
GLIDESHEATH SLEND SS 6F .021 (SHEATH) ×2 IMPLANT
GUIDEWIRE INQWIRE 1.5J.035X260 (WIRE) ×1 IMPLANT
INQWIRE 1.5J .035X260CM (WIRE) ×2
KIT HEART LEFT (KITS) ×2 IMPLANT
PACK CARDIAC CATHETERIZATION (CUSTOM PROCEDURE TRAY) ×2 IMPLANT
TRANSDUCER W/STOPCOCK (MISCELLANEOUS) ×2 IMPLANT
TUBING CIL FLEX 10 FLL-RA (TUBING) ×2 IMPLANT

## 2020-05-31 NOTE — Interval H&P Note (Signed)
Cath Lab Visit (complete for each Cath Lab visit)  Clinical Evaluation Leading to the Procedure:   ACS: No.  Non-ACS:    Anginal Classification: CCS III  Anti-ischemic medical therapy: Maximal Therapy (2 or more classes of medications)  Non-Invasive Test Results: Intermediate-risk stress test findings: cardiac mortality 1-3%/year  Prior CABG: No previous CABG      History and Physical Interval Note:  05/31/2020 7:33 AM  Sandra [name]  has presented today for surgery, with the diagnosis of chest pain.  The various methods of treatment have been discussed with the patient and family. After consideration of risks, benefits and other options for treatment, the patient has consented to  Procedure(s): LEFT HEART CATH AND CORONARY ANGIOGRAPHY (N/A) as a surgical intervention.  The patient's history has been reviewed, patient examined, no change in status, stable for surgery.  I have reviewed the patient's chart and labs.  Questions were answered to the patient's satisfaction.     Shelva Majestic

## 2020-05-31 NOTE — Discharge Instructions (Signed)
Radial Site Care  This sheet gives you information about how to care for yourself after your procedure. Your health care provider may also give you more specific instructions. If you have problems or questions, contact your health care provider. What can I expect after the procedure? After the procedure, it is common to have:  Bruising and tenderness at the catheter insertion area. Follow these instructions at home: Medicines  Take over-the-counter and prescription medicines only as told by your health care provider. Insertion site care  Follow instructions from your health care provider about how to take care of your insertion site. Make sure you: ? Wash your hands with soap and water before you change your bandage (dressing). If soap and water are not available, use hand sanitizer. ? Change your dressing as told by your health care provider. ? Leave stitches (sutures), skin glue, or adhesive strips in place. These skin closures may need to stay in place for 2 weeks or longer. If adhesive strip edges start to loosen and curl up, you may trim the loose edges. Do not remove adhesive strips completely unless your health care provider tells you to do that.  Check your insertion site every day for signs of infection. Check for: ? Redness, swelling, or pain. ? Fluid or blood. ? Pus or a bad smell. ? Warmth.  Do not take baths, swim, or use a hot tub until your health care provider approves.  You may shower 24-48 hours after the procedure, or as directed by your health care provider. ? Remove the dressing and gently wash the site with plain soap and water. ? Pat the area dry with a clean towel. ? Do not rub the site. That could cause bleeding.  Do not apply powder or lotion to the site. Activity  For 24 hours after the procedure, or as directed by your health care provider: ? Do not flex or bend the affected arm. ? Do not push or pull heavy objects with the affected arm. ? Do not drive  yourself home from the hospital or clinic. You may drive 24 hours after the procedure unless your health care provider tells you not to. ? Do not operate machinery or power tools.  Do not lift anything that is heavier than 10 lb (4.5 kg), or the limit that you are told, until your health care provider says that it is safe.  Ask your health care provider when it is okay to: ? Return to work or school. ? Resume usual physical activities or sports. ? Resume sexual activity.   General instructions  If the catheter site starts to bleed, raise your arm and put firm pressure on the site. If the bleeding does not stop, get help right away. This is a medical emergency.  If you went home on the same day as your procedure, a responsible adult should be with you for the first 24 hours after you arrive home.  Keep all follow-up visits as told by your health care provider. This is important. Contact a health care provider if:  You have a fever.  You have redness, swelling, or yellow drainage around your insertion site. Get help right away if:  You have unusual pain at the radial site.  The catheter insertion area swells very fast.  The insertion area is bleeding, and the bleeding does not stop when you hold steady pressure on the area.  Your arm or hand becomes pale, cool, tingly, or numb. These symptoms may represent a serious   problem that is an emergency. Do not wait to see if the symptoms will go away. Get medical help right away. Call your local emergency services (911 in the [place] Do not drive yourself to the hospital. Summary  After the procedure, it is common to have bruising and tenderness at the site.  Follow instructions from your health care provider about how to take care of your radial site wound. Check the wound every day for signs of infection.  Do not lift anything that is heavier than 10 lb (4.5 kg), or the limit that you are told, until your health care provider says that it  is safe. This information is not intended to replace advice given to you by your health care provider. Make sure you discuss any questions you have with your health care provider. Document Revised: 02/03/2017 Document Reviewed: 02/03/2017 Elsevier Patient Education  2021 [employer]vier Inc.  

## 2020-06-03 ENCOUNTER — Telehealth: Payer: Self-pay | Admitting: Cardiovascular Disease

## 2020-06-03 NOTE — Telephone Encounter (Signed)
I spoke with patient and she would like to sit down with Dr.Nishan to discuss heart cath results. Appointment made for this Wednesday, 06/05/20 at 8:30 am in the [facility] office.

## 2020-06-03 NOTE — Telephone Encounter (Signed)
New message    Patient has questions about her heart cath results, why did not fix the problem why they had her in the hospital

## 2020-06-03 NOTE — Progress Notes (Signed)
Cardiology Office Note    Date:  06/05/2020   ID:  Sandra [name], DOB 1975/12/05, MRN [id number]   PCP:  [facility], [facility]  Cardiologist:  Jenkins Rouge, MD  Advanced Practice Provider:  No care team member to display Electrophysiologist:  None   [id number]   History of Present Illness:   45 y.o. history of HTN, HLD, ADHD, bipolar, bladder CA GERD.  Reactive depression husband died COVID 08/23/2019 and daughter died in childbirth 08/23/22 as well She is raising grandson. Chest pain atypical. Myovue 03/07/20 no ischemia ? Inferior infarct EF 45% TTE inferior hypokinesis EF 50-55% SSCP in ER 04/08/20 R/O no acute ECG changes BS 450   Patient describes chest pain as  constant. Like a heaviness and stabbing chest pain. Under so much stress taking care of her baby grandson. Walks up/down her road 12-15 times/day, -doesn't make chest pain worse-actually gets better. Notices it mostly at rest. Has lost over 100 lbs in the past 2 years by diet and exercise. Tearful over her losses. 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 ADHD (attention deficit hyperactivity disorder)   . Allergic rhinitis   . Anemia    hx of  . Bipolar 1 disorder ([place])   . Bladder tumor   . Cancer ([place])    bladder  . Dizziness   . Full dentures   . GERD (gastroesophageal reflux disease)   . Gross hematuria   . Hyperlipidemia    . Hypertension   . Type 2 diabetes mellitus ([place])   . Urgency of urination    dysuria, sui  . Wears glasses     Past Surgical History:  Procedure Laterality Date  . CYSTOSCOPY N/A 10/03/2015   Procedure: CYSTOSCOPY;  Surgeon: Irine Seal, MD;  Location: [facility];  Service: Urology;  Laterality: N/A;  . CYSTOSCOPY WITH STENT PLACEMENT Right 10/31/2015   Procedure: CYSTOSCOPY WITH ATTEMPTED RIGHT URETERAL OPENING;  Surgeon: Irine Seal, MD;  Location: [facility];  Service: Urology;  Laterality: Right;  . INTERSTIM IMPLANT PLACEMENT N/A 03/14/2018   Procedure: Barrie Lyme IMPLANT FIRST STAGE;  Surgeon: Cleon Gustin, MD;  Location: [facility];  Service: Urology;  Laterality: N/A;  . INTERSTIM IMPLANT PLACEMENT N/A 03/30/2018   Procedure: Barrie Lyme IMPLANT SECOND STAGE;  Surgeon: Cleon Gustin, MD;  Location: [facility];  Service: Urology;  Laterality: N/A;  . LEFT HEART CATH AND CORONARY ANGIOGRAPHY N/A 05/31/2020   Procedure: LEFT HEART CATH AND CORONARY ANGIOGRAPHY;  Surgeon: Troy Sine, MD;  Location: [facility];  Service: Cardiovascular;  Laterality: N/A;  . MULTIPLE TOOTH EXTRACTIONS  2015  . OVARIAN CYST REMOVAL Right 2004 approx  . SHOULDER OPEN ROTATOR CUFF REPAIR Left 04/20/2019   Procedure: ROTATOR CUFF REPAIR SHOULDER OPEN;  Surgeon: Arther Abbott  E, MD;  Location: [facility];  Service: Orthopedics;  Laterality: Left;  . TONSILLECTOMY  12/30/2004  . TOTAL ABDOMINAL HYSTERECTOMY W/ BILATERAL SALPINGOOPHORECTOMY  05/16/2004  . TRANSURETHRAL RESECTION OF BLADDER TUMOR N/A 10/03/2015   Procedure: TRANSURETHRAL RESECTION OF BLADDER TUMOR (TURBT);  Surgeon: Irine Seal, MD;  Location: [facility];  Service: Urology;  Laterality: N/A;  . TRANSURETHRAL RESECTION OF BLADDER TUMOR N/A 10/31/2015   Procedure: RE-STAGINGG TRANSURETHRAL RESECTION OF BLADDER TUMOR (TURBT);  Surgeon: Irine Seal, MD;  Location: [facility];   Service: Urology;  Laterality: N/A;  . TRANSURETHRAL RESECTION OF BLADDER TUMOR N/A 08/17/2016   Procedure: TRANSURETHRAL RESECTION OF BLADDER TUMOR (TURBT);  Surgeon: Cleon Gustin, MD;  Location: [facility];  Service: Urology;  Laterality: N/A;    Current Medications: Current Meds  Medication Sig  . amLODipine (NORVASC) 5 MG tablet Take 1 tablet (5 mg total) by mouth daily.  . Blood Glucose Monitoring Suppl (ACCU-CHEK GUIDE) w/Device KIT 1 Piece by Does not apply route as directed.  Marland Kitchen buPROPion (WELLBUTRIN XL) 300 MG 24 hr tablet Take 300 mg by mouth every morning.  . cloNIDine HCl (KAPVAY) 0.1 MG TB12 ER tablet Take 0.1 mg by mouth every evening.  . diclofenac (VOLTAREN) 75 MG EC tablet Take 75 mg by mouth 2 (two) times daily.  . fenofibrate (TRICOR) 145 MG tablet Take 145 mg by mouth daily.  Marland Kitchen FLUoxetine (PROZAC) 20 MG capsule Take 20 mg by mouth daily.  Marland Kitchen FLUoxetine (PROZAC) 40 MG capsule Take 40 mg by mouth daily.  Marland Kitchen gabapentin (NEURONTIN) 300 MG capsule TAKE 1 CAPSULE(300 MG) BY MOUTH THREE TIMES DAILY (Patient taking differently: Take 300 mg by mouth 4 (four) times daily.)  . glipiZIDE (GLUCOTROL XL) 5 MG 24 hr tablet Take 1 tablet (5 mg total) by mouth daily with breakfast.  . glucose blood (ACCU-CHEK GUIDE) test strip Use as instructed to monitor glucose twice daily, before breakfast and before bed.  . insulin glargine (LANTUS) 100 UNIT/ML Solostar Pen Inject 24 Units into the skin at bedtime. (Patient taking differently: Inject 30 Units into the skin at bedtime.)  . Insulin Pen Needle (B-D ULTRAFINE III SHORT PEN) 31G X 8 MM MISC 1 each by Does not apply route as directed.  . metFORMIN (GLUCOPHAGE) 1000 MG tablet Take 1 tablet (1,000 mg total) by mouth 2 (two) times daily with a meal.  . metoprolol succinate (TOPROL XL) 25 MG 24 hr tablet Take 1 tablet (25 mg total) by mouth daily.  . montelukast (SINGULAIR) 10 MG tablet Take 10 mg by mouth at bedtime.  . pantoprazole (PROTONIX) 40  MG tablet Take 1 tablet (40 mg total) by mouth 2 (two) times daily before a meal.  . rosuvastatin (CRESTOR) 40 MG tablet Take 40 mg by mouth daily.  Marland Kitchen VYVANSE 40 MG capsule Take 40 mg by mouth every morning.   Current Facility-Administered Medications for the 06/05/20 encounter (Office Visit) with Josue Hector, MD  Medication  . betamethasone acetate-betamethasone sodium phosphate (CELESTONE) injection 3 mg     Allergies:   Benazepril, Diphenhydramine, Ibuprofen, Metronidazole, Oxycodone, Adhesive [tape], Codeine, Loratadine, Nystatin-triamcinolone, and Penicillins   Social History   Socioeconomic History  . Marital status: Married    Spouse name: Not on file  . Number of children: Not on file  . Years of education: Not on file  . Highest education level: Not on file  Occupational History  . Not on file  Tobacco Use  .  Smoking status: Former Smoker    Packs/day: 0.50    Years: 10.00    Pack years: 5.00    Types: Cigarettes    Quit date: 11/12/2019    Years since quitting: 0.5  . Smokeless tobacco: Never Used  Vaping Use  . Vaping Use: Never used  Substance and Sexual Activity  . Alcohol use: No  . Drug use: No  . Sexual activity: Not Currently    Birth control/protection: Surgical    Comment: hyst  Other Topics Concern  . Not on file  Social History Narrative  . Not on file   Social Determinants of Health   Financial Resource Strain: Not on file  Food Insecurity: Not on file  Transportation Needs: Not on file  Physical Activity: Not on file  Stress: Not on file  Social Connections: Not on file     Family History:  The patient's family history includes Breast cancer in her maternal aunt; Cancer in her mother and another family member; Diabetes in her daughter; Hypertension in her mother; Lung cancer in her maternal uncle.   ROS:   Please see the history of present illness.    ROS All other systems reviewed and are negative.   PHYSICAL EXAM:   VS:  BP  128/82   Pulse 76   Ht 5' (1.524 m)   Wt 69.2 kg   BMI 29.80 kg/m   Physical Exam  Depressed  Chronically ill  HEENT: normal Neck supple with no adenopathy JVP normal no bruits no thyromegaly Lungs clear with no wheezing and good diaphragmatic motion Heart:  S1/S2 no murmur, no rub, gallop or click PMI normal Abdomen: benighn, BS positve, no tenderness, no AAA no bruit.  No HSM or HJR Distal pulses intact with no bruits No edema Neuro non-focal Skin warm and dry No muscular weakness   Wt Readings from Last 3 Encounters:  06/05/20 69.2 kg  05/31/20 70.3 kg  05/28/20 70.3 kg      Studies/Labs Reviewed:   EKG:   SR rate 85 insignificant Q waves inferior lateral leads   Recent Labs: 04/08/2020: ALT 24 05/29/2020: BUN 11; Creatinine, Ser 0.64; Hemoglobin 14.3; Platelets 383; Potassium 4.0; Sodium 133   Lipid Panel No results found for: CHOL, TRIG, HDL, CHOLHDL, VLDL, LDLCALC, LDLDIRECT  Additional studies/ records that were reviewed today include:  2D echo 03/27/20 IMPRESSIONS     1. There is severe hypokinesis / akinesis of the basal / mid inferior  wall . Left ventricular ejection fraction, by estimation, is 50 to 55%.  The left ventricle has low normal function. There is mild left ventricular  hypertrophy. Left ventricular  diastolic parameters are indeterminate.   2. Right ventricular systolic function is normal. The right ventricular  size is normal.   3. The mitral valve is normal in structure. Trivial mitral valve  regurgitation.   4. The aortic valve is normal in structure. Aortic valve regurgitation is  not visualized.   5. The inferior vena cava is normal in size with greater than 50%  respiratory variability, suggesting right atrial pressure of 3 mmHg.   NST 03/07/2020  No diagnostic ST segment changes indicate ischemia.  Small, mild intensity, apical septal and inferior basal defects that are partially reversible in the setting of significant breast  attenuation and also radiotracer uptake near the inferior wall, most likely artifactual. No large ischemic territories.  This is a low risk study.  Nuclear stress EF: 45%, visually appears normal. Suggest echocardiogram.  ASSESSMENT:    Chest Pain   PLAN:  In order of problems listed above:  CAD: Small vessel diabetic disease. Collateralized occluded PDA and 60% OM2 biggest issue is control of risk factors especially DM add imdur 30 mg daily   DM:  Poorly controlled A1c 14.5 03/29/20 f/u Dr Dorris Fetch   HTN:  Improved with Toprol continue add ARB given DM and low EF  HLD:  ON crestor and tricor target LDL 70 or less LIpid and liver profile today   Depression:  Related to death of husband and daughter f/u psychiatry Prozac    Time:  Spent reviewing angiogram with patient, exam and composing note 25 minutes   F/U 6 months in [place]   Medication Adjustments/Labs and Tests Ordered: Current medicines are reviewed at length with the patient today.  Concerns regarding medicines are outlined above.  Medication changes, Labs and Tests ordered today are listed in the Patient Instructions below. There are no Patient Instructions on file for this visit.   Signed, Jenkins Rouge, MD  06/05/2020 8:39 AM    [facility] [address], [place], [place]  [address] Phone: [phone]; Fax: [phone]

## 2020-06-05 ENCOUNTER — Other Ambulatory Visit: Payer: Self-pay

## 2020-06-05 ENCOUNTER — Ambulatory Visit (INDEPENDENT_AMBULATORY_CARE_PROVIDER_SITE_OTHER): Payer: Medicaid Other | Admitting: Cardiovascular Disease

## 2020-06-05 VITALS — BP 128/82 | HR 76 | Ht 60.0 in | Wt 152.6 lb

## 2020-06-05 DIAGNOSIS — I2510 Atherosclerotic heart disease of native coronary artery without angina pectoris: Secondary | ICD-10-CM | POA: Diagnosis not present

## 2020-06-05 DIAGNOSIS — E782 Mixed hyperlipidemia: Secondary | ICD-10-CM

## 2020-06-05 LAB — LIPID PANEL
Chol/HDL Ratio: 7.9 ratio — ABNORMAL HIGH (ref 0.0–4.4)
Cholesterol, Total: 246 mg/dL — ABNORMAL HIGH (ref 100–199)
HDL: 31 mg/dL — ABNORMAL LOW (ref >39)
LDL Chol Calc (NIH): 163 mg/dL — ABNORMAL HIGH (ref 0–99)
Triglycerides: 273 mg/dL — ABNORMAL HIGH (ref 0–149)
VLDL Cholesterol Cal: 52 mg/dL — ABNORMAL HIGH (ref 5–40)

## 2020-06-05 LAB — HEPATIC FUNCTION PANEL
ALT: 19 IU/L (ref 0–32)
AST: 15 IU/L (ref 0–40)
Albumin: 4.2 g/dL (ref 3.8–4.8)
Alkaline Phosphatase: 137 IU/L — ABNORMAL HIGH (ref 44–121)
Bilirubin Total: 0.2 mg/dL (ref 0.0–1.2)
Bilirubin, Direct: <0.1 mg/dL (ref 0.00–0.40)
Total Protein: 7 g/dL (ref 6.0–8.5)

## 2020-06-05 MED ORDER — ISOSORBIDE MONONITRATE ER 30 MG PO TB24: 30.0000 mg | ORAL_TABLET | Freq: Every day | ORAL | 3 refills | Status: DC

## 2020-06-05 NOTE — Patient Instructions (Addendum)
Medication Instructions:   START ISOSORBIDE 30 MG EVERY DAY  *If you need a refill on your cardiac medications before your next appointment, please call your pharmacy*   Lab Work: LIPID AND LIVER TODAY  If you have labs (blood work) drawn today and your tests are completely normal, you will receive your results only by: Marland Kitchen MyChart Message (if you have MyChart) OR . A paper copy in the mail If you have any lab test that is abnormal or we need to change your treatment, we will call you to review the results.   Testing/Procedures: NONE   Follow-Up: At [facility], you and your health needs are our priority.  As part of our continuing mission to provide you with exceptional heart care, we have created designated Provider Care Teams.  These Care Teams include your primary Cardiologist (physician) and Advanced Practice Providers (APPs -  Physician Assistants and Nurse Practitioners) who all work together to provide you with the care you need, when you need it.  We recommend signing up for the patient portal called "MyChart".  Sign up information is provided on this After Visit Summary.  MyChart is used to connect with patients for Virtual Visits (Telemedicine).  Patients are able to view lab/test results, encounter notes, upcoming appointments, etc.  Non-urgent messages can be sent to your provider as well.   To learn more about what you can do with MyChart, go to [email or web address].    Your next appointment:   6 month(s)  The format for your next appointment:   In Person  Provider:   Jenkins Rouge, MD   Other Instructions NONE

## 2020-06-06 ENCOUNTER — Telehealth: Payer: Self-pay

## 2020-06-06 DIAGNOSIS — E782 Mixed hyperlipidemia: Secondary | ICD-10-CM

## 2020-06-06 DIAGNOSIS — Z79899 Other long term (current) drug therapy: Secondary | ICD-10-CM

## 2020-06-06 NOTE — Telephone Encounter (Signed)
-----   Message from Josue Hector, MD sent at 06/05/2020  5:16 PM EDT ----- LDL is very high has she been taking crestor 40 mg ?

## 2020-06-06 NOTE — Telephone Encounter (Signed)
Spoke to patient through [employer] (pt contacted office about results). Pt states that she is compliant with Crestor 40 mg tablets. Pt was made aware of lab work results.

## 2020-06-11 ENCOUNTER — Telehealth: Payer: Self-pay | Admitting: Internal Medicine

## 2020-06-11 NOTE — Telephone Encounter (Signed)
We aren't waiting to schedule procedure. See last office note and lab result note. Cyril Mourning wanted to see pt back for f/u in October. Her diabetes has to be better under control and she needs to see Dr. Dorris Fetch.

## 2020-06-11 NOTE — Telephone Encounter (Signed)
Pt sent a MyChart message saying she can't wait that long that food is getting hung in her throat. We just seen her the first part of May and has a FU OV in OCT. She must be thinking that's when her procedure will be. Are we still waiting for Dr Roseanne Kaufman procedure schedule to open up? Please advise. [phone]

## 2020-06-17 NOTE — Addendum Note (Signed)
Addended by: Christella Scheuermann C on: 06/17/2020 10:32 AM   Modules accepted: Orders

## 2020-06-18 DIAGNOSIS — Z9682 Presence of neurostimulator: Secondary | ICD-10-CM | POA: Insufficient documentation

## 2020-06-18 DIAGNOSIS — Z8551 Personal history of malignant neoplasm of bladder: Secondary | ICD-10-CM | POA: Insufficient documentation

## 2020-06-18 DIAGNOSIS — E119 Type 2 diabetes mellitus without complications: Secondary | ICD-10-CM | POA: Insufficient documentation

## 2020-06-18 DIAGNOSIS — F319 Bipolar disorder, unspecified: Secondary | ICD-10-CM | POA: Insufficient documentation

## 2020-06-18 DIAGNOSIS — Z90710 Acquired absence of both cervix and uterus: Secondary | ICD-10-CM | POA: Insufficient documentation

## 2020-06-18 NOTE — Telephone Encounter (Signed)
Pt notified and verbalized understanding.

## 2020-06-24 NOTE — Progress Notes (Deleted)
Cardiology Office Note    Date:  06/24/2020   ID:  [name], DOB 05-Nov-1975, MRN [id number]   PCP:  Coolidge Breeze, [place]  Cardiologist:  Jenkins Rouge, MD *** Advanced Practice Provider:  No care team member to display Electrophysiologist:  None   [phone]   No chief complaint on file.   History of Present Illness:  Sandra [name] is a 45 y.o. female  with history of hypertension, DM, HLD ADHD, bipolar, bladder CA, GERD.  Patient saw Dr. Johnsie Cancel 02/28/2020 for evaluation of chest pain. Lost her husband to Covid19 09/02/19, daughter died in childbirth 01-02-20, raising grandson.  Lexiscan Myoview 03/07/2020 low risk study EF 45% visually appeared normal.  03/27/2020 severe hypokinesis/akinesis of the basal mid inferior wall LVEF 50 to 55%, mild LVH.  Dr. Johnsie Cancel recommend cardiac MRI if she was still having chest pain.  Patient was in the ER 3/28 with recurrent chest pain and hyperglycemia.  Asking for pain meds.  glucose 450  repeat 252, was not eating properly.  Troponins negative EKG without acute change.  Cardiac cath 05/31/2020 vessel CAD with diffuse small vessels diabetic female, occluded PDA with left-to-right collaterals 70% proximal to mid RCA and 60% OM 2 medical therapy recommended.    Past Medical History:  Diagnosis Date   ADHD (attention deficit hyperactivity disorder)    Allergic rhinitis    Anemia    hx of   Bipolar 1 disorder (HCC)    Bladder tumor    Cancer ([place])    bladder   Dizziness    Full dentures    GERD (gastroesophageal reflux disease)    Gross hematuria    Hyperlipidemia    Hypertension    Type 2 diabetes mellitus (HCC)    Urgency of urination    dysuria, sui   Wears glasses     Past Surgical History:  Procedure Laterality Date   CYSTOSCOPY N/A 10/03/2015   Procedure: CYSTOSCOPY;  Surgeon: Irine Seal, MD;  Location: [facility];  Service: Urology;  Laterality: N/A;   CYSTOSCOPY WITH  STENT PLACEMENT Right 10/31/2015   Procedure: CYSTOSCOPY WITH ATTEMPTED RIGHT URETERAL OPENING;  Surgeon: Irine Seal, MD;  Location: [facility];  Service: Urology;  Laterality: Right;   INTERSTIM IMPLANT PLACEMENT N/A 03/14/2018   Procedure: Barrie Lyme IMPLANT FIRST STAGE;  Surgeon: Cleon Gustin, MD;  Location: [facility];  Service: Urology;  Laterality: N/A;   INTERSTIM IMPLANT PLACEMENT N/A 03/30/2018   Procedure: Barrie Lyme IMPLANT SECOND STAGE;  Surgeon: Cleon Gustin, MD;  Location: [facility];  Service: Urology;  Laterality: N/A;   LEFT HEART CATH AND CORONARY ANGIOGRAPHY N/A 05/31/2020   Procedure: LEFT HEART CATH AND CORONARY ANGIOGRAPHY;  Surgeon: Troy Sine, MD;  Location: [facility] [facility];  Service: Cardiovascular;  Laterality: N/A;   MULTIPLE TOOTH EXTRACTIONS  2015   OVARIAN CYST REMOVAL Right 2004 approx   SHOULDER OPEN ROTATOR CUFF REPAIR Left 04/20/2019   Procedure: ROTATOR CUFF REPAIR SHOULDER OPEN;  Surgeon: Carole Civil, MD;  Location: [facility];  Service: Orthopedics;  Laterality: Left;   TONSILLECTOMY  01-01-2005   TOTAL ABDOMINAL HYSTERECTOMY W/ BILATERAL SALPINGOOPHORECTOMY  05/16/2004   TRANSURETHRAL RESECTION OF BLADDER TUMOR N/A 10/03/2015   Procedure: TRANSURETHRAL RESECTION OF BLADDER TUMOR (TURBT);  Surgeon: Irine Seal, MD;  [facility];  Service: Urology;  Laterality: N/A;   TRANSURETHRAL RESECTION OF BLADDER TUMOR N/A 10/31/2015  Procedure: RE-STAGINGG TRANSURETHRAL RESECTION OF BLADDER TUMOR (TURBT);  Surgeon: Irine Seal, MD;  [facility];  Service: Urology;  Laterality: N/A;   TRANSURETHRAL RESECTION OF BLADDER TUMOR N/A 08/17/2016   Procedure: TRANSURETHRAL RESECTION OF BLADDER TUMOR (TURBT);  Surgeon: Cleon Gustin, MD;  Location: [facility];  Service: Urology;  Laterality: N/A;    Current Medications: No outpatient medications have been marked as taking for the 06/25/20 encounter  (Appointment) with Imogene Burn, PA-C.   Current Facility-Administered Medications for the 06/25/20 encounter (Appointment) with Imogene Burn, PA-C  Medication   betamethasone acetate-betamethasone sodium phosphate (CELESTONE) injection 3 mg     Allergies:   Benazepril, Diphenhydramine, Ibuprofen, Metronidazole, Oxycodone, Adhesive [tape], Codeine, Loratadine, Nystatin-triamcinolone, and Penicillins   Social History   Socioeconomic History   Marital status: Married    Spouse name: Not on file   Number of children: Not on file   Years of education: Not on file   Highest education level: Not on file  Occupational History   Not on file  Tobacco Use   Smoking status: Former    Packs/day: 0.50    Years: 10.00    Pack years: 5.00    Types: Cigarettes    Quit date: 11/12/2019    Years since quitting: 0.6   Smokeless tobacco: Never  Vaping Use   Vaping Use: Never used  Substance and Sexual Activity   Alcohol use: No   Drug use: No   Sexual activity: Not Currently    Birth control/protection: Surgical    Comment: hyst  Other Topics Concern   Not on file  Social History Narrative   Not on file   Social Determinants of Health   Financial Resource Strain: Not on file  Food Insecurity: Not on file  Transportation Needs: Not on file  Physical Activity: Not on file  Stress: Not on file  Social Connections: Not on file     Family History:  The patient's ***family history includes Breast cancer in her maternal aunt; Cancer in her mother and another family member; Diabetes in her daughter; Hypertension in her mother; Lung cancer in her maternal uncle.   ROS:   Please see the history of present illness.    ROS All other systems reviewed and are negative.   PHYSICAL EXAM:   VS:  There were no vitals taken for this visit.  Physical Exam  GEN: Well nourished, well developed, in no acute distress  HEENT: normal  Neck: no JVD, carotid bruits, or masses Cardiac:RRR; no  murmurs, rubs, or gallops  Respiratory:  clear to auscultation bilaterally, normal work of breathing GI: soft, nontender, nondistended, + BS Ext: without cyanosis, clubbing, or edema, Good distal pulses bilaterally MS: no deformity or atrophy  Skin: warm and dry, no rash Neuro:  Alert and Oriented x 3, Strength and sensation are intact Psych: euthymic mood, full affect  Wt Readings from Last 3 Encounters:  06/05/20 152 lb 9.6 oz (69.2 kg)  05/31/20 155 lb (70.3 kg)  05/28/20 155 lb (70.3 kg)      Studies/Labs Reviewed:   EKG:  EKG is*** ordered today.  The ekg ordered today demonstrates ***  Recent Labs: 05/29/2020: BUN 11; Creatinine, Ser 0.64; Hemoglobin 14.3; Platelets 383; Potassium 4.0; Sodium 133 06/05/2020: ALT 19   Lipid Panel    Component Value Date/Time   CHOL 246 (H) 06/05/2020 0853   TRIG 273 (H) 06/05/2020 0853   HDL 31 (L) 06/05/2020 0853   CHOLHDL  7.9 (H) 06/05/2020 0853   LDLCALC 163 (H) 06/05/2020 5465    Additional studies/ records that were reviewed today include:   Cardiac cath 05/31/2020 Prox RCA lesion is 30% stenosed. RPDA lesion is 100% stenosed. Prox Cx lesion is 40% stenosed. 1st Mrg lesion is 30% stenosed. Mid Cx lesion is 20% stenosed. 2nd Mrg lesion is 60% stenosed. Mid RCA to Dist RCA lesion is 50% stenosed. Prox RCA to Mid RCA lesion is 70% stenosed. Prox LAD lesion is 20% stenosed. Ramus lesion is 30% stenosed. Mid LAD lesion is 20% stenosed.   Multivessel CAD with diffuse small vessels in this diabetic female.  The LAD has mild 20% areas of narrowing.  The ramus intermediate vessel has 30% narrowing.  The circumflex vessel has a percent proximal narrowing on a bend in the vessel and then has 20 to 30% stenoses.  There is focal 60% stenosis in the second marginal vessel.  From the left system there is collateralization to the very distal PDA.  The right coronary artery is diffusely small vessel in its mid segment with focal narrowing of  70% after the anterior RV marginal branch.  The mid -distal PDA is occluded and collateralized from the left circulation.  2D echo 03/27/20 IMPRESSIONS     1. There is severe hypokinesis / akinesis of the basal / mid inferior  wall . Left ventricular ejection fraction, by estimation, is 50 to 55%.  The left ventricle has low normal function. There is mild left ventricular  hypertrophy. Left ventricular  diastolic parameters are indeterminate.   2. Right ventricular systolic function is normal. The right ventricular  size is normal.   3. The mitral valve is normal in structure. Trivial mitral valve  regurgitation.   4. The aortic valve is normal in structure. Aortic valve regurgitation is  not visualized.   5. The inferior vena cava is normal in size with greater than 50%  respiratory variability, suggesting right atrial pressure of 3 mmHg.   NST 03/07/2020 No diagnostic ST segment changes indicate ischemia. Small, mild intensity, apical septal and inferior basal defects that are partially reversible in the setting of significant breast attenuation and also radiotracer uptake near the inferior wall, most likely artifactual. No large ischemic territories. This is a low risk study. Nuclear stress EF: 45%, visually appears normal. Suggest echocardiogram.      Risk Assessment/Calculations:   {Does this patient have ATRIAL FIBRILLATION?[phone]}     ASSESSMENT:    1. Coronary artery disease involving native coronary artery of native heart without angina pectoris   2. Essential hypertension   3. Mixed hyperlipidemia   4. Uncontrolled type 2 diabetes mellitus with hyperglycemia (HCC)   5. Stress      PLAN:  In order of problems listed above:  CAD multi small vessel diabetic disease occluded PDA with collateralization 60% OM 2 Imdur added by Dr. 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 stress test, cath, DCCV, TEE, etc)?   Press F2        :[id number]}    Medication Adjustments/Labs and Tests Ordered: Current medicines are reviewed at length with the patient today.  Concerns regarding medicines are outlined above.  Medication changes, Labs and Tests ordered today are listed  in the Patient Instructions below. There are no Patient Instructions on file for this visit.   Sumner Boast, PA-C  06/24/2020 3:23 PM    [facility] [place], [place], [place]  [address] Phone: [phone]; Fax: [phone]

## 2020-06-25 ENCOUNTER — Ambulatory Visit: Payer: Medicaid Other | Admitting: Physician Assistant

## 2020-07-04 NOTE — Progress Notes (Signed)
Cardiology Office Note    Date:  07/16/2020   ID:  Sandra [name], DOB [birth year], MRN [id number]   PCP:  Coolidge Breeze, [facility]  Cardiologist:  Jenkins Rouge, MD  Advanced Practice Provider:  No care team member to display Electrophysiologist:  None   [id number]}   History of Present Illness:   45 y.o. history of HTN, HLD, ADHD, bipolar, bladder CA GERD.  Reactive depression husband died COVID September 18, 2019 and daughter died in childbirth 2022-09-18 as well She is raising grandson. Chest pain atypical. Myovue 03/07/20 no ischemia ? Inferior infarct EF 45% TTE inferior hypokinesis EF 50-55% SSCP in ER 04/08/20 R/O no acute ECG changes BS 450   Patient describes chest pain as  constant. Like a heaviness and stabbing chest pain. Under so much stress taking care of her baby grandson. Walks up/down her road 12-15 times/day, -doesn't make chest pain worse-actually gets better. Notices it mostly at rest. Has lost over 100 lbs in the past 2 years by diet and exercise. Tearful over her losses. See's psychiatrist    Unable to do cardiac MRI to assess for scar due to urologic Interstim implant   She has had hives with Benazapril   05/28/20 seen by me in office and continued to have chest pain and concerns about her heart since she is raising her 89 month old grandson Cath arranged  Cath Dr Claiborne Billings 05/31/20 showed occluded PDA with left to right collaterals  and 70% proximal to mid RCA Disease OM2 60%  Medical Rx recommended   Discussed issues with vaping and poorly controlled DM leading to small vessel CAD She is on a better Regiman and seeing endocrine in [place]     Past Medical History:  Diagnosis Date   ADHD (attention deficit hyperactivity disorder)    Allergic rhinitis    Anemia    hx of   Bipolar 1 disorder ([place])    Bladder tumor    Cancer ([place])    bladder   Dizziness    Full dentures    GERD (gastroesophageal reflux disease)    Gross hematuria     Hyperlipidemia    Hypertension    Type 2 diabetes mellitus ([place])    Urgency of urination    dysuria, sui   Wears glasses     Past Surgical History:  Procedure Laterality Date   CYSTOSCOPY N/A 10/03/2015   Procedure: CYSTOSCOPY;  Surgeon: Irine Seal, MD;  Location: [facility];  Service: Urology;  Laterality: N/A;   CYSTOSCOPY WITH STENT PLACEMENT Right 10/31/2015   Procedure: CYSTOSCOPY WITH ATTEMPTED RIGHT URETERAL OPENING;  Surgeon: Irine Seal, MD;  Location: [facility];  Service: Urology;  Laterality: Right;   INTERSTIM IMPLANT PLACEMENT N/A 03/14/2018   Procedure: Barrie Lyme IMPLANT FIRST STAGE;  Surgeon: Cleon Gustin, MD;  Location: [facility];  Service: Urology;  Laterality: N/A;   INTERSTIM IMPLANT PLACEMENT N/A 03/30/2018   Procedure: Barrie Lyme IMPLANT SECOND STAGE;  Surgeon: Cleon Gustin, MD;  Location: [facility];  Service: Urology;  Laterality: N/A;   LEFT HEART CATH AND CORONARY ANGIOGRAPHY N/A 05/31/2020   Procedure: LEFT HEART CATH AND CORONARY ANGIOGRAPHY;  Surgeon: Troy Sine, MD;  Location: [place] [facility];  Service: Cardiovascular;  Laterality: N/A;   MULTIPLE TOOTH EXTRACTIONS  2015   OVARIAN CYST REMOVAL Right 2004 approx   SHOULDER OPEN ROTATOR CUFF REPAIR Left 04/20/2019   Procedure: ROTATOR CUFF REPAIR SHOULDER  OPEN;  Surgeon: Carole Civil, MD;  Location: [facility];  Service: Orthopedics;  Laterality: Left;   TONSILLECTOMY  12/30/2004   TOTAL ABDOMINAL HYSTERECTOMY W/ BILATERAL SALPINGOOPHORECTOMY  05/16/2004   TRANSURETHRAL RESECTION OF BLADDER TUMOR N/A 10/03/2015   Procedure: TRANSURETHRAL RESECTION OF BLADDER TUMOR (TURBT);  Surgeon: Irine Seal, MD;  Location[facility];  Service: Urology;  Laterality: N/A;   TRANSURETHRAL RESECTION OF BLADDER TUMOR N/A 10/31/2015   Procedure: RE-STAGINGG TRANSURETHRAL RESECTION OF BLADDER TUMOR (TURBT);  Surgeon: Irine Seal, MD;  Location: [facility];   Service: Urology;  Laterality: N/A;   TRANSURETHRAL RESECTION OF BLADDER TUMOR N/A 08/17/2016   Procedure: TRANSURETHRAL RESECTION OF BLADDER TUMOR (TURBT);  Surgeon: Cleon Gustin, MD;  Location: [facility];  Service: Urology;  Laterality: N/A;    Current Medications: Current Meds  Medication Sig   Accu-Chek Softclix Lancets lancets Use as instructed to monitor glucose 4 times daily   amLODipine (NORVASC) 5 MG tablet Take 1 tablet (5 mg total) by mouth daily.   Blood Glucose Monitoring Suppl (ACCU-CHEK GUIDE) w/Device KIT 1 Piece by Does not apply route as directed.   buPROPion (WELLBUTRIN XL) 300 MG 24 hr tablet Take 300 mg by mouth every morning.   cloNIDine HCl (KAPVAY) 0.1 MG TB12 ER tablet Take 0.1 mg by mouth every evening.   diclofenac (VOLTAREN) 75 MG EC tablet Take 75 mg by mouth 2 (two) times daily.   fenofibrate (TRICOR) 145 MG tablet Take 145 mg by mouth daily.   FLUoxetine (PROZAC) 20 MG capsule Take 20 mg by mouth daily.   FLUoxetine (PROZAC) 40 MG capsule Take 40 mg by mouth daily.   gabapentin (NEURONTIN) 300 MG capsule TAKE 1 CAPSULE(300 MG) BY MOUTH THREE TIMES DAILY (Patient taking differently: Take 300 mg by mouth 4 (four) times daily.)   glipiZIDE (GLUCOTROL XL) 5 MG 24 hr tablet Take 1 tablet (5 mg total) by mouth daily with breakfast.   glucose blood (ACCU-CHEK GUIDE) test strip Use as instructed to monitor glucose 4 times daily, before meals and before bed.   hydrocortisone (ANUSOL-HC) 2.5 % rectal cream Place 1 application rectally 2 (two) times daily.   insulin aspart (NOVOLOG) 100 UNIT/ML FlexPen Inject 10-16 Units into the skin 3 (three) times daily with meals.   insulin glargine (LANTUS) 100 UNIT/ML Solostar Pen Inject 40 Units into the skin at bedtime.   Insulin Pen Needle (PEN NEEDLES) 33G X 4 MM MISC 1 each by Does not apply route in the morning, at noon, in the evening, and at bedtime.   isosorbide mononitrate (IMDUR) 30 MG 24 hr tablet Take 1 tablet (30 mg  total) by mouth daily.   metFORMIN (GLUCOPHAGE) 1000 MG tablet Take 1 tablet (1,000 mg total) by mouth 2 (two) times daily with a meal.   metoprolol succinate (TOPROL XL) 25 MG 24 hr tablet Take 1 tablet (25 mg total) by mouth daily.   montelukast (SINGULAIR) 10 MG tablet Take 10 mg by mouth at bedtime.   pantoprazole (PROTONIX) 40 MG tablet Take 1 tablet (40 mg total) by mouth 2 (two) times daily before a meal.   rosuvastatin (CRESTOR) 40 MG tablet Take 40 mg by mouth daily.   VYVANSE 40 MG capsule Take 40 mg by mouth every morning.   Current Facility-Administered Medications for the 07/16/20 encounter (Office Visit) with Josue Hector, MD  Medication   betamethasone acetate-betamethasone sodium phosphate (CELESTONE) injection 3 mg     Allergies:  Benazepril, Diphenhydramine, Ibuprofen, Metronidazole, Oxycodone, Adhesive [tape], Codeine, Loratadine, Nystatin-triamcinolone, and Penicillins   Social History   Socioeconomic History   Marital status: Married    Spouse name: Not on file   Number of children: Not on file   Years of education: Not on file   Highest education level: Not on file  Occupational History   Not on file  Tobacco Use   Smoking status: Former    Packs/day: 0.50    Years: 10.00    Pack years: 5.00    Types: Cigarettes    Quit date: 11/12/2019    Years since quitting: 0.6   Smokeless tobacco: Never  Vaping Use   Vaping Use: Never used  Substance and Sexual Activity   Alcohol use: No   Drug use: No   Sexual activity: Not Currently    Birth control/protection: Surgical    Comment: hyst  Other Topics Concern   Not on file  Social History Narrative   Not on file   Social Determinants of Health   Financial Resource Strain: Not on file  Food Insecurity: Not on file  Transportation Needs: Not on file  Physical Activity: Not on file  Stress: Not on file  Social Connections: Not on file     Family History:  The patient's family history includes Breast  cancer in her maternal aunt; Cancer in her mother and another family member; Diabetes in her daughter; Hypertension in her mother; Lung cancer in her maternal uncle.   ROS:   Please see the history of present illness.    ROS All other systems reviewed and are negative.   PHYSICAL EXAM:   VS:  BP (!) 142/88   Pulse 89   Ht 5' (1.524 m)   Wt 72.1 kg   SpO2 98%   BMI 31.05 kg/m   Physical Exam  Anxious  Chronically ill  HEENT: Xanthelasma  Neck supple with no adenopathy JVP normal no bruits no thyromegaly Lungs clear with no wheezing and good diaphragmatic motion Heart:  S1/S2 no murmur, no rub, gallop or click PMI normal Abdomen: benighn, BS positve, no tenderness, no AAA no bruit.  No HSM or HJR Distal pulses intact with no bruits No edema Neuro non-focal Skin warm and dry No muscular weakness   Wt Readings from Last 3 Encounters:  07/16/20 72.1 kg  07/10/20 71.2 kg  06/05/20 69.2 kg      Studies/Labs Reviewed:   EKG:   SR rate 85 insignificant Q waves inferior lateral leads   Recent Labs: 05/29/2020: BUN 11; Creatinine, Ser 0.64; Hemoglobin 14.3; Platelets 383; Potassium 4.0; Sodium 133 06/05/2020: ALT 19   Lipid Panel    Component Value Date/Time   CHOL 246 (H) 06/05/2020 0853   TRIG 273 (H) 06/05/2020 0853   HDL 31 (L) 06/05/2020 0853   CHOLHDL 7.9 (H) 06/05/2020 0853   LDLCALC 163 (H) 06/05/2020 6283    Additional studies/ records that were reviewed today include:  2D echo 03/27/20 IMPRESSIONS     1. There is severe hypokinesis / akinesis of the basal / mid inferior  wall . Left ventricular ejection fraction, by estimation, is 50 to 55%.  The left ventricle has low normal function. There is mild left ventricular  hypertrophy. Left ventricular  diastolic parameters are indeterminate.   2. Right ventricular systolic function is normal. The right ventricular  size is normal.   3. The mitral valve is normal in structure. Trivial mitral valve   regurgitation.   4.  The aortic valve is normal in structure. Aortic valve regurgitation is  not visualized.   5. The inferior vena cava is normal in size with greater than 50%  respiratory variability, suggesting right atrial pressure of 3 mmHg.   NST 03/07/2020 No diagnostic ST segment changes indicate ischemia. Small, mild intensity, apical septal and inferior basal defects that are partially reversible in the setting of significant breast attenuation and also radiotracer uptake near the inferior wall, most likely artifactual. No large ischemic territories. This is a low risk study. Nuclear stress EF: 45%, visually appears normal. Suggest echocardiogram.     ASSESSMENT:    Chest Pain   PLAN:  In order of problems listed above:  CAD: Small vessel diabetic disease. Collateralized occluded PDA and 60% OM2 biggest issue is control of risk factors especially DM added imdur 30 mg daily   DM:  Poorly controlled A1c 14.5 03/29/20 f/u Dr Dorris Fetch   HTN:  Improved with Toprol continue add ARB given DM and low EF  HLD:  ON crestor and tricor target LDL 70 or less   Depression:  Related to death of husband and daughter f/u psychiatry Prozac    Time:  Spent reviewing angiogram with patient, exam and composing note 25 minutes   F/U 6 months in [place]   Medication Adjustments/Labs and Tests Ordered: Current medicines are reviewed at length with the patient today.  Concerns regarding medicines are outlined above.  Medication changes, Labs and Tests ordered today are listed in the Patient Instructions below. There are no Patient Instructions on file for this visit.   Signed, Jenkins Rouge, MD  07/16/2020 2:00 PM    [facility] [place], [place],   [address] Phone: [phone]; Fax: [phone]

## 2020-07-10 ENCOUNTER — Ambulatory Visit (INDEPENDENT_AMBULATORY_CARE_PROVIDER_SITE_OTHER): Payer: Medicaid Other | Admitting: Nurse Practitioner

## 2020-07-10 ENCOUNTER — Encounter: Payer: Self-pay | Admitting: Nurse Practitioner

## 2020-07-10 ENCOUNTER — Other Ambulatory Visit: Payer: Self-pay

## 2020-07-10 VITALS — BP 162/102 | HR 88 | Ht 60.0 in | Wt 157.0 lb

## 2020-07-10 DIAGNOSIS — I10 Essential (primary) hypertension: Secondary | ICD-10-CM

## 2020-07-10 DIAGNOSIS — E1165 Type 2 diabetes mellitus with hyperglycemia: Secondary | ICD-10-CM

## 2020-07-10 DIAGNOSIS — E782 Mixed hyperlipidemia: Secondary | ICD-10-CM

## 2020-07-10 LAB — POCT GLYCOSYLATED HEMOGLOBIN (HGB A1C): Hemoglobin A1C: 11.2 % — AB (ref 4.0–5.6)

## 2020-07-10 LAB — POCT UA - MICROALBUMIN
Creatinine, POC: 300 mg/dL
Microalbumin Ur, POC: 80 mg/L

## 2020-07-10 MED ORDER — INSULIN ASPART 100 UNIT/ML FLEXPEN: 10.0000 [IU] | PEN_INJECTOR | Freq: Three times a day (TID) | SUBCUTANEOUS | 3 refills | Status: DC

## 2020-07-10 MED ORDER — ACCU-CHEK SOFTCLIX LANCETS MISC: 12 refills | Status: DC

## 2020-07-10 MED ORDER — ACCU-CHEK GUIDE VI STRP: ORAL_STRIP | 2 refills | Status: DC

## 2020-07-10 MED ORDER — PEN NEEDLES 33G X 4 MM MISC: 1.0000 | Freq: Four times a day (QID) | 12 refills | Status: DC

## 2020-07-10 MED ORDER — INSULIN GLARGINE 100 UNIT/ML SOLOSTAR PEN: 40.0000 [IU] | PEN_INJECTOR | Freq: Every day | SUBCUTANEOUS | 3 refills | Status: DC

## 2020-07-10 NOTE — Patient Instructions (Signed)

## 2020-07-10 NOTE — Progress Notes (Signed)
07/10/2020, 4:07 PM                   Endocrinology follow-up note   Subjective:    Patient ID: Sandra [name], female    DOB: 06-11-1975.  Sandra [name] is being seen in follow-up  for management of currently uncontrolled symptomatic type 2  diabetes requested by  Coolidge Breeze, FNP.   Past Medical History:  Diagnosis Date   ADHD (attention deficit hyperactivity disorder)    Allergic rhinitis    Anemia    hx of   Bipolar 1 disorder (HCC)    Bladder tumor    Cancer ([place])    bladder   Dizziness    Full dentures    GERD (gastroesophageal reflux disease)    Gross hematuria    Hyperlipidemia    Hypertension    Type 2 diabetes mellitus (HCC)    Urgency of urination    dysuria, sui   Wears glasses     Past Surgical History:  Procedure Laterality Date   CYSTOSCOPY N/A 10/03/2015   Procedure: CYSTOSCOPY;  Surgeon: Irine Seal, MD;  Location: [facility];  Service: Urology;  Laterality: N/A;   CYSTOSCOPY WITH STENT PLACEMENT Right 10/31/2015   Procedure: CYSTOSCOPY WITH ATTEMPTED RIGHT URETERAL OPENING;  Surgeon: Irine Seal, MD;  Location: [facility];  Service: Urology;  Laterality: Right;   INTERSTIM IMPLANT PLACEMENT N/A 03/14/2018   Procedure: Barrie Lyme IMPLANT FIRST STAGE;  Surgeon: Cleon Gustin, MD;  Location: [facility];  Service: Urology;  Laterality: N/A;   INTERSTIM IMPLANT PLACEMENT N/A 03/30/2018   Procedure: Barrie Lyme IMPLANT SECOND STAGE;  Surgeon: Cleon Gustin, MD;  Location: [facility];  Service: Urology;  Laterality: N/A;   LEFT HEART CATH AND CORONARY ANGIOGRAPHY N/A 05/31/2020   Procedure: LEFT HEART CATH AND CORONARY ANGIOGRAPHY;  Surgeon: Troy Sine, MD;  Location: [facility];  Service: Cardiovascular;  Laterality: N/A;   MULTIPLE TOOTH EXTRACTIONS  2015   OVARIAN CYST REMOVAL Right 2004 approx   SHOULDER OPEN ROTATOR CUFF REPAIR Left  04/20/2019   Procedure: ROTATOR CUFF REPAIR SHOULDER OPEN;  Surgeon: Carole Civil, MD;  Location: [facility];  Service: Orthopedics;  Laterality: Left;   TONSILLECTOMY  12/30/2004   TOTAL ABDOMINAL HYSTERECTOMY W/ BILATERAL SALPINGOOPHORECTOMY  05/16/2004   TRANSURETHRAL RESECTION OF BLADDER TUMOR N/A 10/03/2015   Procedure: TRANSURETHRAL RESECTION OF BLADDER TUMOR (TURBT);  Surgeon: Irine Seal, MD;  Location: [facility];  Service: Urology;  Laterality: N/A;   TRANSURETHRAL RESECTION OF BLADDER TUMOR N/A 10/31/2015   Procedure: RE-STAGINGG TRANSURETHRAL RESECTION OF BLADDER TUMOR (TURBT);  Surgeon: Irine Seal, MD;  Location: [facility];  Service: Urology;  Laterality: N/A;   TRANSURETHRAL RESECTION OF BLADDER TUMOR N/A 08/17/2016   Procedure: TRANSURETHRAL RESECTION OF BLADDER TUMOR (TURBT);  Surgeon: Cleon Gustin, MD;  Location: [facility];  Service: Urology;  Laterality: N/A;    Social History   Socioeconomic History   Marital status: Married    Spouse name: Not on file   Number of children: Not on file   Years of education: Not on file  Highest education level: Not on file  Occupational History   Not on file  Tobacco Use   Smoking status: Former    Packs/day: 0.50    Years: 10.00    Pack years: 5.00    Types: Cigarettes    Quit date: 11/12/2019    Years since quitting: 0.6   Smokeless tobacco: Never  Vaping Use   Vaping Use: Never used  Substance and Sexual Activity   Alcohol use: No   Drug use: No   Sexual activity: Not Currently    Birth control/protection: Surgical    Comment: hyst  Other Topics Concern   Not on file  Social History Narrative   Not on file   Social Determinants of Health   Financial Resource Strain: Not on file  Food Insecurity: Not on file  Transportation Needs: Not on file  Physical Activity: Not on file  Stress: Not on file  Social Connections: Not on file    Family History  Problem Relation Age of  Onset   Hypertension Mother    Cancer Mother    Breast cancer Maternal Aunt    Lung cancer Maternal Uncle    Cancer Other    Diabetes Daughter    Colon cancer Neg Hx     Outpatient Encounter Medications as of 07/10/2020  Medication Sig   Accu-Chek Softclix Lancets lancets Use as instructed to monitor glucose 4 times daily   insulin aspart (NOVOLOG) 100 UNIT/ML FlexPen Inject 10-16 Units into the skin 3 (three) times daily with meals.   Insulin Pen Needle (PEN NEEDLES) 33G X 4 MM MISC 1 each by Does not apply route in the morning, at noon, in the evening, and at bedtime.   amLODipine (NORVASC) 5 MG tablet Take 1 tablet (5 mg total) by mouth daily.   Blood Glucose Monitoring Suppl (ACCU-CHEK GUIDE) w/Device KIT 1 Piece by Does not apply route as directed.   buPROPion (WELLBUTRIN XL) 300 MG 24 hr tablet Take 300 mg by mouth every morning.   cloNIDine HCl (KAPVAY) 0.1 MG TB12 ER tablet Take 0.1 mg by mouth every evening.   diclofenac (VOLTAREN) 75 MG EC tablet Take 75 mg by mouth 2 (two) times daily.   fenofibrate (TRICOR) 145 MG tablet Take 145 mg by mouth daily.   FLUoxetine (PROZAC) 20 MG capsule Take 20 mg by mouth daily.   FLUoxetine (PROZAC) 40 MG capsule Take 40 mg by mouth daily.   gabapentin (NEURONTIN) 300 MG capsule TAKE 1 CAPSULE(300 MG) BY MOUTH THREE TIMES DAILY (Patient taking differently: Take 300 mg by mouth 4 (four) times daily.)   glipiZIDE (GLUCOTROL XL) 5 MG 24 hr tablet Take 1 tablet (5 mg total) by mouth daily with breakfast. (Patient not taking: Reported on 07/10/2020)   glucose blood (ACCU-CHEK GUIDE) test strip Use as instructed to monitor glucose 4 times daily, before meals and before bed.   hydrocortisone (ANUSOL-HC) 2.5 % rectal cream Place 1 application rectally 2 (two) times daily. (Patient not taking: No sig reported)   insulin glargine (LANTUS) 100 UNIT/ML Solostar Pen Inject 40 Units into the skin at bedtime.   isosorbide mononitrate (IMDUR) 30 MG 24 hr tablet  Take 1 tablet (30 mg total) by mouth daily.   metFORMIN (GLUCOPHAGE) 1000 MG tablet Take 1 tablet (1,000 mg total) by mouth 2 (two) times daily with a meal.   metoprolol succinate (TOPROL XL) 25 MG 24 hr tablet Take 1 tablet (25 mg total) by mouth daily.   montelukast (SINGULAIR)  10 MG tablet Take 10 mg by mouth at bedtime.   pantoprazole (PROTONIX) 40 MG tablet Take 1 tablet (40 mg total) by mouth 2 (two) times daily before a meal.   rosuvastatin (CRESTOR) 40 MG tablet Take 40 mg by mouth daily.   VYVANSE 40 MG capsule Take 40 mg by mouth every morning.   [DISCONTINUED] glucose blood (ACCU-CHEK GUIDE) test strip Use as instructed to monitor glucose twice daily, before breakfast and before bed.   [DISCONTINUED] insulin glargine (LANTUS) 100 UNIT/ML Solostar Pen Inject 24 Units into the skin at bedtime. (Patient taking differently: Inject 30 Units into the skin at bedtime.)   [DISCONTINUED] Insulin Pen Needle (B-D ULTRAFINE III SHORT PEN) 31G X 8 MM MISC 1 each by Does not apply route as directed.   Facility-Administered Encounter Medications as of 07/10/2020  Medication   betamethasone acetate-betamethasone sodium phosphate (CELESTONE) injection 3 mg    ALLERGIES: Allergies  Allergen Reactions   Benazepril Hives   Diphenhydramine Hives   Ibuprofen Hives    itching   Metronidazole Nausea And Vomiting   Oxycodone     Pt has tolerated hydromorphone in the past and takes Norco at home.   Adhesive [Tape] Rash   Codeine Nausea And Vomiting and Rash   Loratadine Rash   Nystatin-Triamcinolone Rash   Penicillins Rash    Has patient had a PCN reaction causing immediate rash, facial/tongue/throat swelling, SOB or lightheadedness with hypotension: No Has patient had a PCN reaction causing severe rash involving mucus membranes or skin necrosis: Yes Has patient had a PCN reaction that required hospitalization: No Has patient had a PCN reaction occurring within the last 10 years: no  If all of  the above answers are "NO", then may proceed with Cephalosporin use.    VACCINATION STATUS: Immunization History  Administered Date(s) Administered   Influenza,inj,Quad PF,6+ Mos 11/01/2015    Diabetes She presents for her follow-up diabetic visit. She has type 2 diabetes mellitus. Onset time: She was diagnosed at approximate age of 42 years with A1c of greater than 15% Her disease course has been improving (She was initiated on Lantus, metformin.  She admits that she has not been consistent taking her Lantus, however she has seen improvement in her glycemic profile.). Pertinent negatives for hypoglycemia include no confusion, headaches, pallor or seizures. Associated symptoms include foot paresthesias and polyuria. Pertinent negatives for diabetes include no blurred vision, no chest pain, no fatigue, no polydipsia and no polyphagia. There are no hypoglycemic complications. Symptoms are stable. Diabetic complications include peripheral neuropathy. Risk factors for coronary artery disease include dyslipidemia, diabetes mellitus, hypertension, sedentary lifestyle and tobacco exposure. Current diabetic treatment includes insulin injections and oral agent (monotherapy). She is compliant with treatment most of the time. Her weight is increasing steadily. She is following a generally unhealthy diet. When asked about meal planning, she reported none. She has not had a previous visit with a dietitian. She rarely participates in exercise. (She presents today after being absent for over 1 year with her meter (which she shares with her daughter).  She did not bring logs, so I am unable to determine what readings were truly hers.  Her POCT A1c today is 11.2%, improving from previous reading of 14.5%.  She does admit that she has been using some of her daughters Novolog insulin at meal times to try and manage her high readings.  Her cardiologist has since stopped her Glipizide, says she has 3 blockages which will  require intervention in the   coming months.  She denies any hypoglycemia. ) An ACE inhibitor/angiotensin II receptor blocker is not being taken. She does not see a podiatrist.Eye exam is current.  Hyperlipidemia This is a chronic problem. The current episode started more than 1 year ago. The problem is uncontrolled. Recent lipid tests were reviewed and are high. Exacerbating diseases include diabetes. Factors aggravating her hyperlipidemia include beta blockers and fatty foods. Pertinent negatives include no chest pain, myalgias or shortness of breath. Current antihyperlipidemic treatment includes fibric acid derivatives and statins. The current treatment provides mild improvement of lipids. Compliance problems include adherence to diet and adherence to exercise.  Risk factors for coronary artery disease include diabetes mellitus, dyslipidemia, a sedentary lifestyle, family history and hypertension.  Hypertension This is a chronic problem. The current episode started more than 1 year ago. The problem is uncontrolled. Pertinent negatives include no blurred vision, chest pain, headaches, palpitations or shortness of breath. There are no associated agents to hypertension. Risk factors for coronary artery disease include dyslipidemia, diabetes mellitus and sedentary lifestyle. Past treatments include calcium channel blockers and beta blockers. The current treatment provides mild improvement. Compliance problems include diet and exercise.  Hypertensive end-organ damage includes CAD/MI.   Review of systems  Constitutional: + Minimally fluctuating body weight,  current Body mass index is 30.66 kg/m. , no fatigue, no subjective hyperthermia, no subjective hypothermia Eyes: no blurry vision, no xerophthalmia ENT: no sore throat, no nodules palpated in throat, no dysphagia/odynophagia, no hoarseness Cardiovascular: no chest pain, no shortness of breath, no palpitations, no leg swelling Respiratory: no cough, no  shortness of breath Gastrointestinal: no nausea/vomiting/diarrhea Genitourinary: + polyuria Musculoskeletal: no muscle/joint aches Skin: no rashes, no hyperemia Neurological: no tremors, + numbness/tingling to BLE, no dizziness Psychiatric: no depression, no anxiety   Objective:    BP (!) 162/102   Pulse 88   Ht 5' (1.524 m)   Wt 157 lb (71.2 kg)   BMI 30.66 kg/m   Wt Readings from Last 3 Encounters:  07/10/20 157 lb (71.2 kg)  06/05/20 152 lb 9.6 oz (69.2 kg)  05/31/20 155 lb (70.3 kg)    BP Readings from Last 3 Encounters:  07/10/20 (!) 162/102  06/05/20 128/82  05/31/20 124/80    Physical Exam- Limited  Constitutional:  Body mass index is 30.66 kg/m. , not in acute distress, normal state of mind Eyes:  EOMI, no exophthalmos Neck: Supple Cardiovascular: RRR, no murmurs, rubs, or gallops, no edema Respiratory: Adequate breathing efforts, no crackles, rales, rhonchi, or wheezing Musculoskeletal: no gross deformities, strength intact in all four extremities, no gross restriction of joint movements Skin:  no rashes, no hyperemia Neurological: no tremor with outstretched hands    CMP ( most recent) CMP     Component Value Date/Time   NA 133 (L) 05/29/2020 1218   K 4.0 05/29/2020 1218   CL 99 05/29/2020 1218   CO2 25 05/29/2020 1218   GLUCOSE 374 (H) 05/29/2020 1218   BUN 11 05/29/2020 1218   BUN 9 02/24/2018 0000   CREATININE 0.64 05/29/2020 1218   CALCIUM 9.4 05/29/2020 1218   PROT 7.0 06/05/2020 0853   ALBUMIN 4.2 06/05/2020 0853   AST 15 06/05/2020 0853   ALT 19 06/05/2020 0853   ALKPHOS 137 (H) 06/05/2020 0853   BILITOT 0.2 06/05/2020 0853   GFRNONAA >60 05/29/2020 1218   GFRAA >60 04/17/2019 1427     Diabetic Labs (most recent): Lab Results  Component Value Date   HGBA1C 11.2 (A) 07/10/2020  HGBA1C 14.5 (H) 03/29/2020   HGBA1C 8.5 (H) 04/17/2019      Assessment & Plan:   1) Uncontrolled type 2 diabetes mellitus with hyperglycemia  ([place])  - Sandra [name] has currently uncontrolled symptomatic type 2 DM since  45 years of age.  She presents today after being absent for over 1 year with her meter (which she shares with her daughter).  She did not bring logs, so I am unable to determine what readings were truly hers.  Her POCT A1c today is 11.2%, improving from previous reading of 14.5%.  She does admit that she has been using some of her daughters Novolog insulin at meal times to try and manage her high readings.  Her cardiologist has since stopped her Glipizide, says she has 3 blockages which will require intervention in the coming months.  She denies any hypoglycemia.  - I had a long discussion with her about the progressive nature of diabetes and the pathology behind its complications. -her diabetes is complicated by chronic smoking and she remains at a high risk for more acute and chronic complications which include CAD, CVA, CKD, retinopathy, and neuropathy. These are all discussed in detail with her.  - Nutritional counseling repeated at each appointment due to patients tendency to fall back in to old habits.  - The patient admits there is a room for improvement in their diet and drink choices. -  Suggestion is made for the patient to avoid simple carbohydrates from their diet including Cakes, Sweet Desserts / Pastries, Ice Cream, Soda (diet and regular), Sweet Tea, Candies, Chips, Cookies, Sweet Pastries, Store Bought Juices, Alcohol in Excess of 1-2 drinks a day, Artificial Sweeteners, Coffee Creamer, and "Sugar-free" Products. This will help patient to have stable blood glucose profile and potentially avoid unintended weight gain.   - I encouraged the patient to switch to unprocessed or minimally processed complex starch and increased protein intake (animal or plant source), fruits, and vegetables.   - Patient is advised to stick to a routine mealtimes to eat 3 meals a day and avoid unnecessary snacks (to snack only to  correct hypoglycemia).  - she will be scheduled with Jearld Fenton, RDN, CDE for diabetes education.  - I have approached her with the following individualized plan to manage  her diabetes and patient agrees:   -Based on her persistent hyperglycemia, she is approached to start basal/bolus regimen and she agrees.  She is advised to increase her Lantus to 40 units SQ nightly and start Novolog 10-16 units TID with meals if glucose is above 90 and she is eating (specific instructions on how to titrate her insulin dose based on glucose readings given to patient in writing and she demonstrated her ability to properly use the SSI chart with me today).  She can also continue her Metformin 1000 mg po twice daily with meals.    -She is encouraged to continue monitoring blood glucose 4 times daily, before meals and at bedtime, and to call the clinic if she has readings less than 70 or greater than 300 for 3 readings in a row.   - she is not a suitable candidate for incretin therapy, she will need fasting lipid profile on subsequent visits.  - Specific targets for  A1c;  LDL, HDL, Triglycerides, were discussed with the patient.  2) Blood Pressure /Hypertension: Her blood pressure is not controlled to target. She is allergic to ACE inhibitors. She has a history of being a chronic heavy smoker, quit  in 2021.  She is advised to continue her current medications including Metoprolol 25 mg po daily and Norvasc 5 mg po daily.   3) Lipids/Hyperlipidemia:  Her most recent lipid panel from 06/05/20 shows uncontrolled LDL of 163 and elevated triglycerides of 273.  She is advised to continue her Crestor 40 mg po daily at bedtime and Tricor 145 mg po daily.  Side effects and precautions discussed with her.    4)  Weight/Diet:  Her Body mass index is 30.66 kg/m.-clearly complicating her diabetes care.  She is a candidate for modest weight loss.  I discussed with her the fact that loss of 5 - 10% of her  current body  weight will have the most impact on her diabetes management.  Exercise, and detailed carbohydrates information provided  -  detailed on discharge instructions.  5) Chronic Care/Health Maintenance: -she  is not on ACEI/ARB and is on Statin medications and is encouraged to initiate and continue to follow up with Ophthalmology, Dentist,  Podiatrist at least yearly or according to recommendations, and advised to stay away from smoking. I have recommended yearly flu vaccine and pneumonia vaccine at least every 5 years; moderate intensity exercise for up to 150 minutes weekly; and  sleep for at least 7 hours a day.  - she is  advised to maintain close follow up with Coolidge Breeze, FNP for primary care needs, as well as her other providers for optimal and coordinated care.    I spent 42 minutes in the care of the patient today including review of labs from [place], Lipids, Thyroid Function, Hematology (current and previous including abstractions from other facilities); face-to-face time discussing  her blood glucose readings/logs, discussing hypoglycemia and hyperglycemia episodes and symptoms, medications doses, her options of short and long term treatment based on the latest standards of care / guidelines;  discussion about incorporating lifestyle medicine;  and documenting the encounter.    Please refer to Patient Instructions for Blood Glucose Monitoring and Insulin/Medications Dosing Guide"  in media tab for additional information. Please  also refer to " Patient Self Inventory" in the Media  tab for reviewed elements of pertinent patient history.  Sandra [name] participated in the discussions, expressed understanding, and voiced agreement with the above plans.  All questions were answered to her satisfaction. she is encouraged to contact clinic should she have any questions or concerns prior to her return visit.   Follow up plan: - Return in about 2 weeks (around 07/24/2020) for Diabetes F/U, Bring  meter and logs.  Rayetta Pigg, [facility] [address] [place], [place] [address] Phone: [phone] Fax: [phone]  07/10/2020, 4:07 PM

## 2020-07-16 ENCOUNTER — Encounter: Payer: Self-pay | Admitting: Cardiovascular Disease

## 2020-07-16 ENCOUNTER — Other Ambulatory Visit: Payer: Self-pay

## 2020-07-16 ENCOUNTER — Ambulatory Visit (INDEPENDENT_AMBULATORY_CARE_PROVIDER_SITE_OTHER): Payer: Medicaid Other | Admitting: Cardiovascular Disease

## 2020-07-16 VITALS — BP 142/88 | HR 89 | Ht 60.0 in | Wt 159.0 lb

## 2020-07-16 DIAGNOSIS — I2510 Atherosclerotic heart disease of native coronary artery without angina pectoris: Secondary | ICD-10-CM | POA: Diagnosis not present

## 2020-07-16 DIAGNOSIS — E782 Mixed hyperlipidemia: Secondary | ICD-10-CM | POA: Diagnosis not present

## 2020-07-16 DIAGNOSIS — I10 Essential (primary) hypertension: Secondary | ICD-10-CM | POA: Diagnosis not present

## 2020-07-16 NOTE — Patient Instructions (Signed)
Medication Instructions:  *If you need a refill on your cardiac medications before your next appointment, please call your pharmacy*  Lab Work: If you have labs (blood work) drawn today and your tests are completely normal, you will receive your results only by: [place] (if you have MyChart) OR A paper copy in the mail If you have any lab test that is abnormal or we need to change your treatment, we will call you to review the results.  Testing/Procedures: None ordered today.  Follow-Up: At [facility], you and your health needs are our priority.  As part of our continuing mission to provide you with exceptional heart care, we have created designated Provider Care Teams.  These Care Teams include your primary Cardiologist (physician) and Advanced Practice Providers (APPs -  Physician Assistants and Nurse Practitioners) who all work together to provide you with the care you need, when you need it.  We recommend signing up for the patient portal called "MyChart".  Sign up information is provided on this After Visit Summary.  MyChart is used to connect with patients for Virtual Visits (Telemedicine).  Patients are able to view lab/test results, encounter notes, upcoming appointments, etc.  Non-urgent messages can be sent to your provider as well.   To learn more about what you can do with MyChart, go to [email or web address].    Your next appointment:   6 month(s)  The format for your next appointment:   In Person  Provider:   You may see Jenkins Rouge, MD or one of the following Advanced Practice Providers on your designated Care Team:   [place], PA-C  Ermalinda Barrios, [place]

## 2020-07-24 ENCOUNTER — Ambulatory Visit (INDEPENDENT_AMBULATORY_CARE_PROVIDER_SITE_OTHER): Payer: Medicaid Other | Admitting: Nurse Practitioner

## 2020-07-24 ENCOUNTER — Encounter: Payer: Self-pay | Admitting: Nurse Practitioner

## 2020-07-24 VITALS — BP 147/96 | HR 92 | Ht 60.0 in | Wt 163.0 lb

## 2020-07-24 DIAGNOSIS — E782 Mixed hyperlipidemia: Secondary | ICD-10-CM | POA: Diagnosis not present

## 2020-07-24 DIAGNOSIS — I10 Essential (primary) hypertension: Secondary | ICD-10-CM | POA: Diagnosis not present

## 2020-07-24 DIAGNOSIS — E1165 Type 2 diabetes mellitus with hyperglycemia: Secondary | ICD-10-CM

## 2020-07-24 MED ORDER — DEXCOM G6 TRANSMITTER MISC: 1.0000 | 1 refills | Status: DC

## 2020-07-24 MED ORDER — DEXCOM G6 SENSOR MISC: 3 refills | Status: DC

## 2020-07-24 NOTE — Progress Notes (Signed)
07/24/2020, 2:01 PM                   Endocrinology follow-up note   Subjective:    Patient ID: Sandra [name], female    DOB: August 07, 1975.  Sandra [name] is being seen in follow-up  for management of currently uncontrolled symptomatic type 2  diabetes requested by  Coolidge Breeze, FNP.   Past Medical History:  Diagnosis Date   ADHD (attention deficit hyperactivity disorder)    Allergic rhinitis    Anemia    hx of   Bipolar 1 disorder (HCC)    Bladder tumor    Cancer ([place])    bladder   Dizziness    Full dentures    GERD (gastroesophageal reflux disease)    Gross hematuria    Hyperlipidemia    Hypertension    Type 2 diabetes mellitus (HCC)    Urgency of urination    dysuria, sui   Wears glasses     Past Surgical History:  Procedure Laterality Date   CYSTOSCOPY N/A 10/03/2015   Procedure: CYSTOSCOPY;  Surgeon: Irine Seal, MD;  Location: [facility];  Service: Urology;  Laterality: N/A;   CYSTOSCOPY WITH STENT PLACEMENT Right 10/31/2015   Procedure: CYSTOSCOPY WITH ATTEMPTED RIGHT URETERAL OPENING;  Surgeon: Irine Seal, MD;  Location: [facility];  Service: Urology;  Laterality: Right;   INTERSTIM IMPLANT PLACEMENT N/A 03/14/2018   Procedure: Barrie Lyme IMPLANT FIRST STAGE;  Surgeon: Cleon Gustin, MD;  Location: [facility];  Service: Urology;  Laterality: N/A;   INTERSTIM IMPLANT PLACEMENT N/A 03/30/2018   Procedure: Barrie Lyme IMPLANT SECOND STAGE;  Surgeon: Cleon Gustin, MD;  Location: [facility];  Service: Urology;  Laterality: N/A;   LEFT HEART CATH AND CORONARY ANGIOGRAPHY N/A 05/31/2020   Procedure: LEFT HEART CATH AND CORONARY ANGIOGRAPHY;  Surgeon: Troy Sine, MD;  Location: [place] [facility];  Service: Cardiovascular;  Laterality: N/A;   MULTIPLE TOOTH EXTRACTIONS  2015   OVARIAN CYST REMOVAL Right 2004 approx   SHOULDER OPEN ROTATOR CUFF REPAIR Left  04/20/2019   Procedure: ROTATOR CUFF REPAIR SHOULDER OPEN;  Surgeon: Carole Civil, MD;  Location: [facility];  Service: Orthopedics;  Laterality: Left;   TONSILLECTOMY  12/30/2004   TOTAL ABDOMINAL HYSTERECTOMY W/ BILATERAL SALPINGOOPHORECTOMY  05/16/2004   TRANSURETHRAL RESECTION OF BLADDER TUMOR N/A 10/03/2015   Procedure: TRANSURETHRAL RESECTION OF BLADDER TUMOR (TURBT);  Surgeon: Irine Seal, MD;  Location: [facility];  Service: Urology;  Laterality: N/A;   TRANSURETHRAL RESECTION OF BLADDER TUMOR N/A 10/31/2015   Procedure: RE-STAGINGG TRANSURETHRAL RESECTION OF BLADDER TUMOR (TURBT);  Surgeon: Irine Seal, MD;  Location: [facility];  Service: Urology;  Laterality: N/A;   TRANSURETHRAL RESECTION OF BLADDER TUMOR N/A 08/17/2016   Procedure: TRANSURETHRAL RESECTION OF BLADDER TUMOR (TURBT);  Surgeon: Cleon Gustin, MD;  Location: [facility];  Service: Urology;  Laterality: N/A;    Social History   Socioeconomic History   Marital status: Married    Spouse name: Not on file   Number of children: Not on file   Years of education: Not on file  Highest education level: Not on file  Occupational History   Not on file  Tobacco Use   Smoking status: Former    Packs/day: 0.50    Years: 10.00    Pack years: 5.00    Types: Cigarettes    Quit date: 11/12/2019    Years since quitting: 0.6   Smokeless tobacco: Never  Vaping Use   Vaping Use: Never used  Substance and Sexual Activity   Alcohol use: No   Drug use: No   Sexual activity: Not Currently    Birth control/protection: Surgical    Comment: hyst  Other Topics Concern   Not on file  Social History Narrative   Not on file   Social Determinants of Health   Financial Resource Strain: Not on file  Food Insecurity: Not on file  Transportation Needs: Not on file  Physical Activity: Not on file  Stress: Not on file  Social Connections: Not on file    Family History  Problem Relation Age of  Onset   Hypertension Mother    Cancer Mother    Breast cancer Maternal Aunt    Lung cancer Maternal Uncle    Cancer Other    Diabetes Daughter    Colon cancer Neg Hx     Outpatient Encounter Medications as of 07/24/2020  Medication Sig   Continuous Blood Gluc Sensor (DEXCOM G6 SENSOR) MISC Apply new sensor every 10 days as directed   Continuous Blood Gluc Transmit (DEXCOM G6 TRANSMITTER) MISC 1 Device by Does not apply route every 3 (three) months.   Accu-Chek Softclix Lancets lancets Use as instructed to monitor glucose 4 times daily   amLODipine (NORVASC) 5 MG tablet Take 1 tablet (5 mg total) by mouth daily.   Blood Glucose Monitoring Suppl (ACCU-CHEK GUIDE) w/Device KIT 1 Piece by Does not apply route as directed.   buPROPion (WELLBUTRIN XL) 300 MG 24 hr tablet Take 300 mg by mouth every morning.   cloNIDine HCl (KAPVAY) 0.1 MG TB12 ER tablet Take 0.1 mg by mouth every evening.   diclofenac (VOLTAREN) 75 MG EC tablet Take 75 mg by mouth 2 (two) times daily.   fenofibrate (TRICOR) 145 MG tablet Take 145 mg by mouth daily.   FLUoxetine (PROZAC) 20 MG capsule Take 20 mg by mouth daily.   FLUoxetine (PROZAC) 40 MG capsule Take 40 mg by mouth daily.   gabapentin (NEURONTIN) 300 MG capsule TAKE 1 CAPSULE(300 MG) BY MOUTH THREE TIMES DAILY (Patient taking differently: Take 300 mg by mouth 4 (four) times daily.)   glipiZIDE (GLUCOTROL XL) 5 MG 24 hr tablet Take 1 tablet (5 mg total) by mouth daily with breakfast.   glucose blood (ACCU-CHEK GUIDE) test strip Use as instructed to monitor glucose 4 times daily, before meals and before bed.   hydrocortisone (ANUSOL-HC) 2.5 % rectal cream Place 1 application rectally 2 (two) times daily.   insulin aspart (NOVOLOG) 100 UNIT/ML FlexPen Inject 10-16 Units into the skin 3 (three) times daily with meals.   insulin glargine (LANTUS) 100 UNIT/ML Solostar Pen Inject 40 Units into the skin at bedtime.   Insulin Pen Needle (PEN NEEDLES) 33G X 4 MM MISC 1  each by Does not apply route in the morning, at noon, in the evening, and at bedtime.   isosorbide mononitrate (IMDUR) 30 MG 24 hr tablet Take 1 tablet (30 mg total) by mouth daily.   metFORMIN (GLUCOPHAGE) 1000 MG tablet Take 1 tablet (1,000 mg total) by mouth 2 (two) times daily  with a meal.   metoprolol succinate (TOPROL XL) 25 MG 24 hr tablet Take 1 tablet (25 mg total) by mouth daily.   montelukast (SINGULAIR) 10 MG tablet Take 10 mg by mouth at bedtime.   pantoprazole (PROTONIX) 40 MG tablet Take 1 tablet (40 mg total) by mouth 2 (two) times daily before a meal.   rosuvastatin (CRESTOR) 40 MG tablet Take 40 mg by mouth daily.   VYVANSE 40 MG capsule Take 40 mg by mouth every morning.   Facility-Administered Encounter Medications as of 07/24/2020  Medication   betamethasone acetate-betamethasone sodium phosphate (CELESTONE) injection 3 mg    ALLERGIES: Allergies  Allergen Reactions   Benazepril Hives   Diphenhydramine Hives   Ibuprofen Hives    itching   Metronidazole Nausea And Vomiting   Oxycodone     Pt has tolerated hydromorphone in the past and takes Norco at home.   Adhesive [Tape] Rash   Codeine Nausea And Vomiting and Rash   Loratadine Rash   Nystatin-Triamcinolone Rash   Penicillins Rash    Has patient had a PCN reaction causing immediate rash, facial/tongue/throat swelling, SOB or lightheadedness with hypotension: No Has patient had a PCN reaction causing severe rash involving mucus membranes or skin necrosis: Yes Has patient had a PCN reaction that required hospitalization: No Has patient had a PCN reaction occurring within the last 10 years: no  If all of the above answers are "NO", then may proceed with Cephalosporin use.    VACCINATION STATUS: Immunization History  Administered Date(s) Administered   Influenza,inj,Quad PF,6+ Mos 11/01/2015    Diabetes She presents for her follow-up diabetic visit. She has type 2 diabetes mellitus. Onset time: She was  diagnosed at approximate age of 42 years with A1c of greater than 15% Her disease course has been improving (She was initiated on Lantus, metformin.  She admits that she has not been consistent taking her Lantus, however she has seen improvement in her glycemic profile.). Pertinent negatives for hypoglycemia include no confusion, headaches, pallor or seizures. Associated symptoms include foot paresthesias. Pertinent negatives for diabetes include no blurred vision, no chest pain, no fatigue, no polydipsia, no polyphagia and no polyuria. There are no hypoglycemic complications. Symptoms are improving. Diabetic complications include peripheral neuropathy. Risk factors for coronary artery disease include dyslipidemia, diabetes mellitus, hypertension, sedentary lifestyle and tobacco exposure. Current diabetic treatment includes intensive insulin program and oral agent (dual therapy). She is compliant with treatment most of the time. Her weight is increasing steadily. She is following a generally unhealthy diet. When asked about meal planning, she reported none. She has not had a previous visit with a dietitian. She rarely participates in exercise. Her home blood glucose trend is decreasing steadily. Her breakfast blood glucose range is generally 130-140 mg/dl. Her lunch blood glucose range is generally 130-140 mg/dl. Her dinner blood glucose range is generally 140-180 mg/dl. Her bedtime blood glucose range is generally 140-180 mg/dl. (She presents today with her logs, no meter, showing greatly improved glycemic profile  both fasting and postprandially.  She was not due for another A1c today.  She denies any hypoglycemia. ) An ACE inhibitor/angiotensin II receptor blocker is not being taken. She does not see a podiatrist.Eye exam is current.  Hyperlipidemia This is a chronic problem. The current episode started more than 1 year ago. The problem is uncontrolled. Recent lipid tests were reviewed and are high.  Exacerbating diseases include diabetes. Factors aggravating her hyperlipidemia include beta blockers and fatty foods. Pertinent negatives   include no chest pain, myalgias or shortness of breath. Current antihyperlipidemic treatment includes fibric acid derivatives and statins. The current treatment provides mild improvement of lipids. Compliance problems include adherence to diet and adherence to exercise.  Risk factors for coronary artery disease include diabetes mellitus, dyslipidemia, a sedentary lifestyle, family history and hypertension.  Hypertension This is a chronic problem. The current episode started more than 1 year ago. The problem is uncontrolled. Pertinent negatives include no blurred vision, chest pain, headaches, palpitations or shortness of breath. There are no associated agents to hypertension. Risk factors for coronary artery disease include dyslipidemia, diabetes mellitus and sedentary lifestyle. Past treatments include calcium channel blockers and beta blockers. The current treatment provides mild improvement. Compliance problems include diet and exercise.  Hypertensive end-organ damage includes CAD/MI.   Review of systems  Constitutional: + Minimally fluctuating body weight,  current Body mass index is 31.83 kg/m. , no fatigue, no subjective hyperthermia, no subjective hypothermia Eyes: no blurry vision, no xerophthalmia ENT: no sore throat, no nodules palpated in throat, no dysphagia/odynophagia, no hoarseness Cardiovascular: no chest pain, no shortness of breath, no palpitations, no leg swelling Respiratory: no cough, no shortness of breath Gastrointestinal: no nausea/vomiting/diarrhea Genitourinary: + polyuria Musculoskeletal: no muscle/joint aches Skin: no rashes, no hyperemia Neurological: no tremors, + numbness/tingling to BLE, no dizziness Psychiatric: no depression, no anxiety   Objective:    BP (!) 147/96   Pulse 92   Ht 5' (1.524 m)   Wt 163 lb (73.9 kg)   BMI  31.83 kg/m   Wt Readings from Last 3 Encounters:  07/24/20 163 lb (73.9 kg)  07/16/20 159 lb (72.1 kg)  07/10/20 157 lb (71.2 kg)    BP Readings from Last 3 Encounters:  07/24/20 (!) 147/96  07/16/20 (!) 142/88  07/10/20 (!) 162/102    Physical Exam- Limited  Constitutional:  Body mass index is 31.83 kg/m. , not in acute distress, normal state of mind Eyes:  EOMI, no exophthalmos Neck: Supple Cardiovascular: RRR, no murmurs, rubs, or gallops, no edema Respiratory: Adequate breathing efforts, no crackles, rales, rhonchi, or wheezing Musculoskeletal: no gross deformities, strength intact in all four extremities, no gross restriction of joint movements Skin:  no rashes, no hyperemia Neurological: no tremor with outstretched hands    CMP ( most recent) CMP     Component Value Date/Time   NA 133 (L) 05/29/2020 1218   K 4.0 05/29/2020 1218   CL 99 05/29/2020 1218   CO2 25 05/29/2020 1218   GLUCOSE 374 (H) 05/29/2020 1218   BUN 11 05/29/2020 1218   BUN 9 02/24/2018 0000   CREATININE 0.64 05/29/2020 1218   CALCIUM 9.4 05/29/2020 1218   PROT 7.0 06/05/2020 0853   ALBUMIN 4.2 06/05/2020 0853   AST 15 06/05/2020 0853   ALT 19 06/05/2020 0853   ALKPHOS 137 (H) 06/05/2020 0853   BILITOT 0.2 06/05/2020 0853   GFRNONAA >60 05/29/2020 1218   GFRAA >60 04/17/2019 1427     Diabetic Labs (most recent): Lab Results  Component Value Date   HGBA1C 11.2 (A) 07/10/2020   HGBA1C 14.5 (H) 03/29/2020   HGBA1C 8.5 (H) 04/17/2019      Assessment & Plan:   1) Uncontrolled type 2 diabetes mellitus with hyperglycemia (HCC)  - Sandra [name] has currently uncontrolled symptomatic type 2 DM since  45 years of age.  She presents today with her logs, no meter, showing greatly improved glycemic profile  both fasting and postprandially.  She was   not due for another A1c today.  She denies any hypoglycemia.  - I had a long discussion with her about the progressive nature of diabetes and  the pathology behind its complications. -her diabetes is complicated by chronic smoking and she remains at a high risk for more acute and chronic complications which include CAD, CVA, CKD, retinopathy, and neuropathy. These are all discussed in detail with her.  - Nutritional counseling repeated at each appointment due to patients tendency to fall back in to old habits.  - The patient admits there is a room for improvement in their diet and drink choices. -  Suggestion is made for the patient to avoid simple carbohydrates from their diet including Cakes, Sweet Desserts / Pastries, Ice Cream, Soda (diet and regular), Sweet Tea, Candies, Chips, Cookies, Sweet Pastries, Store Bought Juices, Alcohol in Excess of 1-2 drinks a day, Artificial Sweeteners, Coffee Creamer, and "Sugar-free" Products. This will help patient to have stable blood glucose profile and potentially avoid unintended weight gain.   - I encouraged the patient to switch to unprocessed or minimally processed complex starch and increased protein intake (animal or plant source), fruits, and vegetables.   - Patient is advised to stick to a routine mealtimes to eat 3 meals a day and avoid unnecessary snacks (to snack only to correct hypoglycemia).  - she will be scheduled with Jearld Fenton, RDN, CDE for diabetes education.  - I have approached her with the following individualized plan to manage  her diabetes and patient agrees:   -Based on her improved glycemic profile, she is advised to continue current medication regimen of Lantus 40 units SQ nightly and Novolog 10-16 units TID with meals if glucose is above 90 and she is eating (specific instructions on how to titrate insulin dose based on glucose readings given to patient in writing).  She can also continue her Metformin 1000 mg po twice daily with meals.  She says her cardiologist stopped her Glipizide at their last visit.  -She is encouraged to continue monitoring blood glucose 4  times daily, before meals and at bedtime, and to call the clinic if she has readings less than 70 or greater than 300 for 3 readings in a row. Will send in Rx for Dexcom to her local pharmacy for fulfillment.   - she is not a suitable candidate for incretin therapy, she will need fasting lipid profile on subsequent visits.  - Specific targets for  A1c;  LDL, HDL, Triglycerides, were discussed with the patient.  2) Blood Pressure /Hypertension: Her blood pressure is not controlled to target. She is allergic to ACE inhibitors. She has a history of being a chronic heavy smoker, quit in 2021.  She is advised to continue her current medications including Metoprolol 25 mg po daily and Norvasc 5 mg po daily.   3) Lipids/Hyperlipidemia:  Her most recent lipid panel from 06/05/20 shows uncontrolled LDL of 163 and elevated triglycerides of 273.  She is advised to continue her Crestor 40 mg po daily at bedtime and Tricor 145 mg po daily.  Side effects and precautions discussed with her.    4)  Weight/Diet:  Her Body mass index is 31.83 kg/m.-clearly complicating her diabetes care.  She is a candidate for modest weight loss.  I discussed with her the fact that loss of 5 - 10% of her  current body weight will have the most impact on her diabetes management.  Exercise, and detailed carbohydrates information provided  -  detailed  on discharge instructions.  5) Chronic Care/Health Maintenance: -she  is not on ACEI/ARB and is on Statin medications and is encouraged to initiate and continue to follow up with Ophthalmology, Dentist,  Podiatrist at least yearly or according to recommendations, and advised to stay away from smoking. I have recommended yearly flu vaccine and pneumonia vaccine at least every 5 years; moderate intensity exercise for up to 150 minutes weekly; and  sleep for at least 7 hours a day.  - she is advised to maintain close follow up with Collins, Gina L, FNP for primary care needs, as well as her  other providers for optimal and coordinated care.      I spent 25 minutes in the care of the patient today including review of labs from CMP, Lipids, Thyroid Function, Hematology (current and previous including abstractions from other facilities); face-to-face time discussing  her blood glucose readings/logs, discussing hypoglycemia and hyperglycemia episodes and symptoms, medications doses, her options of short and long term treatment based on the latest standards of care / guidelines;  discussion about incorporating lifestyle medicine;  and documenting the encounter.    Please refer to Patient Instructions for Blood Glucose Monitoring and Insulin/Medications Dosing Guide"  in media tab for additional information. Please  also refer to " Patient Self Inventory" in the Media  tab for reviewed elements of pertinent patient history.  Sandra [name] participated in the discussions, expressed understanding, and voiced agreement with the above plans.  All questions were answered to her satisfaction. she is encouraged to contact clinic should she have any questions or concerns prior to her return visit.   Follow up plan: - Return in about 3 months (around 10/24/2020) for Diabetes F/U with A1c in office, No previsit labs, [name] meter and logs.  Whitney Reardon, FNP-BC [facility] [address] , [place] [address] Phone: [phone] Fax: [phone]  07/24/2020, 2:01 PM    

## 2020-07-24 NOTE — Patient Instructions (Signed)

## 2020-08-01 ENCOUNTER — Other Ambulatory Visit: Payer: Medicaid Other | Admitting: Urology

## 2020-08-15 ENCOUNTER — Other Ambulatory Visit: Payer: Medicaid Other | Admitting: Urology

## 2020-08-15 DIAGNOSIS — Z8551 Personal history of malignant neoplasm of bladder: Secondary | ICD-10-CM

## 2020-08-20 ENCOUNTER — Ambulatory Visit: Payer: Medicaid Other | Admitting: Nutrition

## 2020-08-22 ENCOUNTER — Other Ambulatory Visit: Payer: Self-pay

## 2020-08-22 ENCOUNTER — Encounter: Payer: Self-pay | Admitting: Internal Medicine

## 2020-08-22 ENCOUNTER — Telehealth (INDEPENDENT_AMBULATORY_CARE_PROVIDER_SITE_OTHER): Payer: Medicaid Other | Admitting: Internal Medicine

## 2020-08-22 ENCOUNTER — Telehealth: Payer: Self-pay | Admitting: Internal Medicine

## 2020-08-22 VITALS — BP 119/79 | Wt 156.0 lb

## 2020-08-22 DIAGNOSIS — E785 Hyperlipidemia, unspecified: Secondary | ICD-10-CM | POA: Diagnosis not present

## 2020-08-22 DIAGNOSIS — I2510 Atherosclerotic heart disease of native coronary artery without angina pectoris: Secondary | ICD-10-CM

## 2020-08-22 DIAGNOSIS — E1165 Type 2 diabetes mellitus with hyperglycemia: Secondary | ICD-10-CM | POA: Diagnosis not present

## 2020-08-22 DIAGNOSIS — E7801 Familial hypercholesterolemia: Secondary | ICD-10-CM | POA: Diagnosis not present

## 2020-08-22 MED ORDER — REPATHA SURECLICK 140 MG/ML ~~LOC~~ SOAJ: 1.0000 | SUBCUTANEOUS | 11 refills | Status: DC

## 2020-08-22 NOTE — Telephone Encounter (Signed)
PA for [employer] submitted via [employer] - Medicaid  Confirmation [id number]

## 2020-08-22 NOTE — Progress Notes (Signed)
Virtual Visit via Video Note   This visit type was conducted due to national recommendations for restrictions regarding the COVID-19 Pandemic (e.g. social distancing) in an effort to limit this patient's exposure and mitigate transmission in our community.  Due to her co-morbid illnesses, this patient is at least at moderate risk for complications without adequate follow up.  This format is felt to be most appropriate for this patient at this time.  All issues noted in this document were discussed and addressed.  A limited physical exam was performed with this format.  Please refer to the patient's chart for her consent to telehealth for [facility].      Date:  08/22/2020   ID:  Sandra [name], DOB 1975-02-16, MRN [id number] The patient was identified using 2 identifiers.  Evaluation Performed:  New Patient Evaluation  Patient Location:  [place] [id number]  Provider location:   [address], [place] [address]  PCP:  Coolidge Breeze, FNP  Cardiologist:  Jenkins Rouge, MD Electrophysiologist:  None   Chief Complaint:  Manage dyslipidemia  History of Present Illness:    Sandra [name] is a 45 y.o. female who presents via audio/video conferencing for a telehealth visit today.  This is a pleasant 45 year old female with a history of bipolar 1 and ADHD, hypertension, dyslipidemia and type 2 diabetes on insulin.  Unfortunately she recently had recurrent chest pain symptoms and underwent heart catheterization in May 2022, which demonstrated multivessel coronary disease with diffuse small vessels.  There was no clear significant focal stenosis for PCI.  Medical therapy was recommended.  She does report family history of early onset heart disease in her father.  He also had high cholesterol.  She currently is on rosuvastatin 40 mg which she has been taking for "years".  She also takes fenofibrate.  Recent lipids demonstrated total cholesterol 202, direct  LDL 146, HDL 31 and triglyceride 168.  Her LDL then without high potency statin will be well over 190 more consistent with a familial hyperlipidemia.  This would explain her very early onset heart disease in addition to her diabetes.  She reports recently her diabetes control is much better as her blood sugars had remain between 4 and 500 and now are down around 130.  The patient does not have symptoms concerning for COVID-19 infection (fever, chills, cough, or new SHORTNESS OF BREATH).    Prior CV studies:   The following studies were reviewed today:  Chart reviewed, lab work  PMHx:  Past Medical History:  Diagnosis Date   ADHD (attention deficit hyperactivity disorder)    Allergic rhinitis    Anemia    hx of   Bipolar 1 disorder (HCC)    Bladder tumor    Cancer ([place])    bladder   Dizziness    Full dentures    GERD (gastroesophageal reflux disease)    Gross hematuria    Hyperlipidemia    Hypertension    Type 2 diabetes mellitus ([place])    Urgency of urination    dysuria, sui   Wears glasses     Past Surgical History:  Procedure Laterality Date   CYSTOSCOPY N/A 10/03/2015   Procedure: CYSTOSCOPY;  Surgeon: Irine Seal, MD;  Location: [facility];  Service: Urology;  Laterality: N/A;   CYSTOSCOPY WITH STENT PLACEMENT Right 10/31/2015   Procedure: CYSTOSCOPY WITH ATTEMPTED RIGHT URETERAL OPENING;  Surgeon: Irine Seal, MD;  Location: [place];  Service: Urology;  Laterality: Right;   INTERSTIM IMPLANT PLACEMENT N/A 03/14/2018   Procedure: Barrie Lyme IMPLANT FIRST STAGE;  Surgeon: Cleon Gustin, MD;  Location: [facility];  Service: Urology;  Laterality: N/A;   INTERSTIM IMPLANT PLACEMENT N/A 03/30/2018   Procedure: Barrie Lyme IMPLANT SECOND STAGE;  Surgeon: Cleon Gustin, MD;  Location: [facility];  Service: Urology;  Laterality: N/A;   LEFT HEART CATH AND CORONARY ANGIOGRAPHY N/A 05/31/2020   Procedure: LEFT HEART CATH AND CORONARY ANGIOGRAPHY;   Surgeon: Troy Sine, MD;  Location: [facility];  Service: Cardiovascular;  Laterality: N/A;   MULTIPLE TOOTH EXTRACTIONS  2015   OVARIAN CYST REMOVAL Right 2004 approx   SHOULDER OPEN ROTATOR CUFF REPAIR Left 04/20/2019   Procedure: ROTATOR CUFF REPAIR SHOULDER OPEN;  Surgeon: Carole Civil, MD;  Location: [facility];  Service: Orthopedics;  Laterality: Left;   TONSILLECTOMY  12/30/2004   TOTAL ABDOMINAL HYSTERECTOMY W/ BILATERAL SALPINGOOPHORECTOMY  05/16/2004   TRANSURETHRAL RESECTION OF BLADDER TUMOR N/A 10/03/2015   Procedure: TRANSURETHRAL RESECTION OF BLADDER TUMOR (TURBT);  Surgeon: Irine Seal, MD;  Location[facility];  Service: Urology;  Laterality: N/A;   TRANSURETHRAL RESECTION OF BLADDER TUMOR N/A 10/31/2015   Procedure: RE-STAGINGG TRANSURETHRAL RESECTION OF BLADDER TUMOR (TURBT);  Surgeon: Irine Seal, MD;  Location: [facility];  Service: Urology;  Laterality: N/A;   TRANSURETHRAL RESECTION OF BLADDER TUMOR N/A 08/17/2016   Procedure: TRANSURETHRAL RESECTION OF BLADDER TUMOR (TURBT);  Surgeon: Cleon Gustin, MD;  Location[facility];  Service: Urology;  Laterality: N/A;    FAMHx:  Family History  Problem Relation Age of Onset   Hypertension Mother    Cancer Mother    Breast cancer Maternal Aunt    Lung cancer Maternal Uncle    Cancer Other    Diabetes Daughter    Colon cancer Neg Hx     SOCHx:   reports that she quit smoking about 9 months ago. Her smoking use included cigarettes. She has a 5.00 pack-year smoking history. She has never used smokeless tobacco. She reports that she does not drink alcohol and does not use drugs.  ALLERGIES:  Allergies  Allergen Reactions   Benazepril Hives   Diphenhydramine Hives   Ibuprofen Hives    itching   Metronidazole Nausea And Vomiting   Oxycodone     Pt has tolerated hydromorphone in the past and takes Norco at home.   Adhesive [Tape] Rash   Codeine Nausea And Vomiting and Rash    Loratadine Rash   Nystatin-Triamcinolone Rash   Penicillins Rash    Has patient had a PCN reaction causing immediate rash, facial/tongue/throat swelling, SOB or lightheadedness with hypotension: No Has patient had a PCN reaction causing severe rash involving mucus membranes or skin necrosis: Yes Has patient had a PCN reaction that required hospitalization: No Has patient had a PCN reaction occurring within the last 10 years: no  If all of the above answers are "NO", then may proceed with Cephalosporin use.    MEDS:  Current Meds  Medication Sig   Accu-Chek Softclix Lancets lancets Use as instructed to monitor glucose 4 times daily   Blood Glucose Monitoring Suppl (ACCU-CHEK GUIDE) w/Device KIT 1 Piece by Does not apply route as directed.   buPROPion (WELLBUTRIN XL) 300 MG 24 hr tablet Take 300 mg by mouth every morning.   cloNIDine HCl (KAPVAY) 0.1 MG TB12 ER tablet Take 0.1 mg by mouth every evening.   Continuous  Blood Gluc Sensor (DEXCOM G6 SENSOR) MISC Apply new sensor every 10 days as directed   Continuous Blood Gluc Transmit (DEXCOM G6 TRANSMITTER) MISC 1 Device by Does not apply route every 3 (three) months.   diclofenac (VOLTAREN) 75 MG EC tablet Take 75 mg by mouth 2 (two) times daily.   Evolocumab (REPATHA SURECLICK) 623 MG/ML SOAJ Inject 1 Dose into the skin every 14 (fourteen) days.   fenofibrate (TRICOR) 145 MG tablet Take 145 mg by mouth daily.   FLUoxetine (PROZAC) 20 MG capsule Take 20 mg by mouth daily.   FLUoxetine (PROZAC) 40 MG capsule Take 40 mg by mouth daily.   gabapentin (NEURONTIN) 300 MG capsule TAKE 1 CAPSULE(300 MG) BY MOUTH THREE TIMES DAILY (Patient taking differently: Take 300 mg by mouth 4 (four) times daily.)   glipiZIDE (GLUCOTROL XL) 5 MG 24 hr tablet Take 1 tablet (5 mg total) by mouth daily with breakfast.   glucose blood (ACCU-CHEK GUIDE) test strip Use as instructed to monitor glucose 4 times daily, before meals and before bed.   hydrocortisone  (ANUSOL-HC) 2.5 % rectal cream Place 1 application rectally 2 (two) times daily.   insulin aspart (NOVOLOG) 100 UNIT/ML FlexPen Inject 10-16 Units into the skin 3 (three) times daily with meals.   insulin glargine (LANTUS) 100 UNIT/ML Solostar Pen Inject 40 Units into the skin at bedtime.   Insulin Pen Needle (PEN NEEDLES) 33G X 4 MM MISC 1 each by Does not apply route in the morning, at noon, in the evening, and at bedtime.   isosorbide mononitrate (IMDUR) 30 MG 24 hr tablet Take 1 tablet (30 mg total) by mouth daily.   metFORMIN (GLUCOPHAGE) 1000 MG tablet Take 1 tablet (1,000 mg total) by mouth 2 (two) times daily with a meal.   metoprolol succinate (TOPROL XL) 25 MG 24 hr tablet Take 1 tablet (25 mg total) by mouth daily.   montelukast (SINGULAIR) 10 MG tablet Take 10 mg by mouth at bedtime.   pantoprazole (PROTONIX) 40 MG tablet Take 1 tablet (40 mg total) by mouth 2 (two) times daily before a meal.   rosuvastatin (CRESTOR) 40 MG tablet Take 40 mg by mouth daily.   VYVANSE 40 MG capsule Take 40 mg by mouth every morning.   Current Facility-Administered Medications for the 08/22/20 encounter (Video Visit) with Pixie Casino, MD  Medication   betamethasone acetate-betamethasone sodium phosphate (CELESTONE) injection 3 mg     ROS: Pertinent items noted in HPI and remainder of comprehensive ROS otherwise negative.  Labs/Other Tests and Data Reviewed:    Recent Labs: 05/29/2020: BUN 11; Creatinine, Ser 0.64; Hemoglobin 14.3; Platelets 383; Potassium 4.0; Sodium 133 06/05/2020: ALT 19   Recent Lipid Panel Lab Results  Component Value Date/Time   CHOL 246 (H) 06/05/2020 08:53 AM   TRIG 273 (H) 06/05/2020 08:53 AM   HDL 31 (L) 06/05/2020 08:53 AM   CHOLHDL 7.9 (H) 06/05/2020 08:53 AM   LDLCALC 163 (H) 06/05/2020 08:53 AM    Wt Readings from Last 3 Encounters:  08/22/20 156 lb (70.8 kg)  07/24/20 163 lb (73.9 kg)  07/16/20 159 lb (72.1 kg)     Exam:    Vital Signs:  BP 119/79    Wt 156 lb (70.8 kg)   BMI 30.47 kg/m    General appearance: alert and no distress Lungs: No audible respiratory difficulty Extremities: extremities normal, atraumatic, no cyanosis or edema Skin: Notable for bilateral xanthelasmas over the medial upper eyelid area Neurologic: Grossly  normal Psych: Pleasant  ASSESSMENT & PLAN:    Probable FH with xanthelasmas Family history of premature coronary disease Multivessel coronary artery disease, medical therapy recommended (05/2020) Type 2 diabetes on insulin-A1c 11.2 Hypertension Obesity  Ms. 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  She also has type 2 diabetes with recent poorly controlled diabetes and A1c of 11.2, however prior to that 4 months ago it was 14.5.  She has been compliant with her statin and fenofibrate but LDL cholesterol remains more than 50% above target.  Without this therapy her LDL would be well over 200.  Would recommend a PCSK9 inhibitor as per clinical trial data from 2015 in this patient population.  Would recommend Repatha 140 mg every 2 weeks.  She is familiar with injections.  We will plan repeat lipids in about 3 to 4 months and follow-up with me virtually at that time per her request.  Thanks as always for the kind referral.  COVID-19 Education: The signs and symptoms of COVID-19 were discussed with the patient and how to seek care for testing (follow up with PCP or arrange E-visit).  The importance of social distancing was discussed today.  Patient Risk:   After full review of this patients clinical status, I feel that they are at least moderate risk at this time.  Time:   Today, I have spent 25 minutes with the patient with telehealth technology discussing dyslipidemia.     Medication Adjustments/Labs and Tests  Ordered: Current medicines are reviewed at length with the patient today.  Concerns regarding medicines are outlined above.   Tests Ordered: Orders Placed This Encounter  Procedures   NMR, lipoprofile    Medication Changes: Meds ordered this encounter  Medications   Evolocumab (REPATHA SURECLICK) 417 MG/ML SOAJ    Sig: Inject 1 Dose into the skin every 14 (fourteen) days.    Dispense:  2 mL    Refill:  11    Disposition:  in 3 month(s)  Pixie Casino, MD, [facility], [place] Director of the Advanced Lipid Disorders &  Cardiovascular Risk Reduction Clinic Diplomate of the American Board of Clinical Lipidology Attending Cardiologist  Direct Dial: [phone]  Fax: [phone]  Website:  [email or web address]  Pixie Casino, MD  08/22/2020 8:48 AM

## 2020-08-22 NOTE — Telephone Encounter (Signed)
Prior Approval [id number] [place]  Recipient [id number] Submission Date:08/22/2020 Status:APPROVED Effective Begin Date:08/22/2020 Effective End Date:08/22/2021

## 2020-08-22 NOTE — Patient Instructions (Signed)
Medication Instructions:  Dr. Debara Pickett recommends Repatha '140mg'$ /mL (PCSK9). This is an injectable cholesterol medication self-administered once every 14 days. This medication will likely need prior approval with your insurance company, which we will work on. If the medication is not approved initially, we may need to do an appeal with your insurance.   Administer medication in area of fatty tissue such as abdomen, outer thigh, back of upper arm - and rotate site with each injection Store medication in refrigerator until ready to administer - allow to sit at room temp for 30 mins - 1 hour prior to injection Dispose of medication in a SHARPS container - your pharmacy should be able to direct you on this and proper disposal   If you need a co-pay card for Repatha: [email or web address]-[email or web address] >> paying for Repatha or red box that says "[place]" in top right If you need a co-pay card for Praluent: WedMap.it >> starting & paying for Praluent  Patient Assistance:   The PAN Foundation: https://www.panfoundation.org/disease-funds/hypercholesterolemia/ -- can sign up for wait list  The [employer] Well foundation offers assistance to help pay for medication copays.  They will cover copays for all cholesterol lowering meds, including statins, fibrates, omega-3 fish oils like Vascepa, ezetimibe, Repatha, Praluent, Nexletol, Nexlizet.  The cards are usually good for $2,500 or 12 months, whichever comes first. Go to healthwellfoundation.org Click on "Apply Now" Answer questions as to whom is applying (patient or representative) Your disease fund will be "hypercholesterolemia - Medicare access" They will ask questions about finances and which medications you are taking for cholesterol When you submit, the approval is usually within minutes.  You will need to print the card information from the site You will need to show this information to your pharmacy, they will bill your Medicare Part D plan first -then bill Health  Well --for the copay.   You can also call them at [phone], although the hold times can be quite long.     *If you need a refill on your cardiac medications before your next appointment, please call your pharmacy*   Lab Work: FASTING lab work in 3-4 months (complete at a [facility] about 1 week before your next appointment)  If you have labs (blood work) drawn today and your tests are completely normal, you will receive your results only by: [place] (if you have MyChart) OR A paper copy in the mail If you have any lab test that is abnormal or we need to change your treatment, we will call you to review the results.   Testing/Procedures: NONE   Follow-Up: At [facility], you and your health needs are our priority.  As part of our continuing mission to provide you with exceptional heart care, we have created designated Provider Care Teams.  These Care Teams include your primary Cardiologist (physician) and Advanced Practice Providers (APPs -  Physician Assistants and Nurse Practitioners) who all work together to provide you with the care you need, when you need it.  We recommend signing up for the patient portal called "MyChart".  Sign up information is provided on this After Visit Summary.  MyChart is used to connect with patients for Virtual Visits (Telemedicine).  Patients are able to view lab/test results, encounter notes, upcoming appointments, etc.  Non-urgent messages can be sent to your provider as well.   To learn more about what you can do with MyChart, go to [email or web address].    Your next appointment:   3-4 month(s) - lipid clinic  The  format for your next appointment:   In Person or Virtual  Provider:   K. Mali Hilty, MD   Other Instructions

## 2020-08-28 ENCOUNTER — Telehealth: Payer: Self-pay | Admitting: Orthopedic Surgery

## 2020-08-28 NOTE — Telephone Encounter (Signed)
Patient called to ask about possible getting in quickly as possible for appointment with Dr Aline Brochure for a "fracture of her ankle in 7 places." [place] she was treated in [place] [facility] room about 1 week ago. States no doctor in [place] will see her due to her insurance of [facility] [place]. Relayed that our providers do not have immediate availability, and also discussed possibility of contacting an orthopaedic foot/ankle specialist. Aware may need all provider's notes from emergency room visit, Xray reports, and Xray CD. Patient said she will start calling. Encouraged to do so today in order to be seen in a timely manner.

## 2020-09-02 ENCOUNTER — Ambulatory Visit (INDEPENDENT_AMBULATORY_CARE_PROVIDER_SITE_OTHER): Payer: Medicaid Other

## 2020-09-02 ENCOUNTER — Ambulatory Visit: Payer: Medicaid Other | Admitting: Podiatry

## 2020-09-02 ENCOUNTER — Other Ambulatory Visit: Payer: Self-pay

## 2020-09-02 DIAGNOSIS — S92502A Displaced unspecified fracture of left lesser toe(s), initial encounter for closed fracture: Secondary | ICD-10-CM | POA: Diagnosis not present

## 2020-09-02 DIAGNOSIS — S92342A Displaced fracture of fourth metatarsal bone, left foot, initial encounter for closed fracture: Secondary | ICD-10-CM | POA: Diagnosis not present

## 2020-09-02 DIAGNOSIS — S92902B Unspecified fracture of left foot, initial encounter for open fracture: Secondary | ICD-10-CM

## 2020-09-02 DIAGNOSIS — S92352A Displaced fracture of fifth metatarsal bone, left foot, initial encounter for closed fracture: Secondary | ICD-10-CM | POA: Diagnosis not present

## 2020-09-02 DIAGNOSIS — S92332A Displaced fracture of third metatarsal bone, left foot, initial encounter for closed fracture: Secondary | ICD-10-CM | POA: Diagnosis not present

## 2020-09-02 MED ORDER — HYDROCODONE-ACETAMINOPHEN 10-325 MG PO TABS: 1.0000 | ORAL_TABLET | Freq: Four times a day (QID) | ORAL | 0 refills | Status: DC | PRN

## 2020-09-02 NOTE — Progress Notes (Signed)
HPI: 45 y.o. female presenting today for new complaint regarding an injury that the patient sustained on 08/22/2020 when she fell at her home in the kitchen.  She denies loss of consciousness or any head trauma.  Patient went to an urgent care and was diagnosed with fractures of the foot.  She presents today wearing a posterior splint and with a wheelchair.  She presents for further treatment and evaluation.  Past Medical History:  Diagnosis Date   ADHD (attention deficit hyperactivity disorder)    Allergic rhinitis    Anemia    hx of   Bipolar 1 disorder (HCC)    Bladder tumor    Cancer ([place])    bladder   Dizziness    Full dentures    GERD (gastroesophageal reflux disease)    Gross hematuria    Hyperlipidemia    Hypertension    Type 2 diabetes mellitus (HCC)    Urgency of urination    dysuria, sui   Wears glasses      Physical Exam: General: The patient is alert and oriented x3 in no acute distress.  Dermatology: Skin is warm, dry and supple bilateral lower extremities. Negative for open lesions or macerations.  Vascular: Palpable pedal pulses bilaterally.  No erythema.  Edema noted left forefoot capillary refill within normal limits.  Neurological: Epicritic and protective threshold diminished bilaterally.   Musculoskeletal Exam: Edema with pain on palpation throughout the entire forefoot.  Ecchymosis also noted  Radiographic Exam:  Close displaced fractures noted to the third fourth and fifth metatarsal heads with displacement.  Assessment: 1.  Closed, displaced fractures metatarsals 3, 4, 5 left foot, initial encounter 2.  Diabetes mellitus with peripheral polyneuropathy   Plan of Care:  1. Patient evaluated. X-Rays reviewed.  2. Today we discussed the conservative versus surgical management of the presenting pathology. The patient opts for surgical management. All possible complications and details of the procedure were explained. All patient questions were  answered. No guarantees were expressed or implied. 3. Authorization for surgery was initiated today. Surgery will consist of open reduction internal fixation metatarsals 3, 4, 5 left with percutaneous pin fixation.  Patient understands she is at high risk with a diabetes mellitus however she would likely have foot complications permanently if she pursued conservative treatment without surgery.  Surgery medically necessary to realign the metatarsals and prevent further complications 4.  Order placed for wheelchair.  Patient needs to be strictly nonweightbearing to the extremity.  Patient suffers from fracture of the metatarsals complicated by diabetes mellitus which impairs their ability to perform daily activities like bathing, dressing, and toileting in the home. A cane, crutch, or walker will not resolve issue with performing activities of daily living. A wheelchair will allow patient to safely perform daily activities. Patient can safely propel the wheelchair in the home or has a caregiver who can provide assistance. Length of need 6 months . Accessories: elevating leg rests (ELRs), wheel locks, extensions and anti-tippers. 5.  Cam boot dispensed.  Wear daily  6.  Patient will require medical clearance from her PCP  7.  Return to clinic 1 week postop       Edrick Kins, DPM [facility]  Dr. Edrick Kins, DPM    [address]  [place], [place] [address]                Office [phone]  Fax [phone]

## 2020-09-03 NOTE — Telephone Encounter (Signed)
Patient called - aware med approved. She said [facility] delivered it yesterday. She has been scheduled for video visit follow up with MD on 12/23/20 @ 0930

## 2020-09-04 ENCOUNTER — Encounter: Payer: Self-pay | Admitting: Podiatry

## 2020-09-06 ENCOUNTER — Telehealth: Payer: Self-pay | Admitting: *Deleted

## 2020-09-06 ENCOUNTER — Other Ambulatory Visit: Payer: Self-pay | Admitting: Podiatry

## 2020-09-06 NOTE — Telephone Encounter (Signed)
Patient is calling for request of her pain medicine refill,runs out tomorrow. Please advise.

## 2020-09-06 NOTE — Telephone Encounter (Signed)
Please advise 

## 2020-09-07 NOTE — Telephone Encounter (Signed)
Refill sent. - Dr. Vincent Streater

## 2020-09-09 NOTE — Telephone Encounter (Signed)
Returned call to paient,no answer, left vmessage that medication has been approved and sent to pharmacy on file.

## 2020-09-10 ENCOUNTER — Ambulatory Visit: Payer: Medicaid Other | Admitting: Nutrition

## 2020-09-11 ENCOUNTER — Other Ambulatory Visit: Payer: Self-pay | Admitting: Podiatry

## 2020-09-11 ENCOUNTER — Telehealth: Payer: Self-pay

## 2020-09-11 DIAGNOSIS — S92502A Displaced unspecified fracture of left lesser toe(s), initial encounter for closed fracture: Secondary | ICD-10-CM

## 2020-09-11 NOTE — Telephone Encounter (Signed)
Called Sandra [name] with [facility] she stated pt had been scheduled for foot surgery tomorrow but her Hgba1c is 20 and her PCP is out of the office and the covering physician was not comfortable clearing pt for surgery. Stated pt's surgery would be postponed until September 15th.

## 2020-09-11 NOTE — Telephone Encounter (Signed)
Levada Dy, RN at [facility] would like you to return her call at [phone] and ask for her.

## 2020-09-12 ENCOUNTER — Other Ambulatory Visit: Payer: Self-pay | Admitting: Podiatry

## 2020-09-13 ENCOUNTER — Other Ambulatory Visit: Payer: Self-pay | Admitting: Podiatry

## 2020-09-16 NOTE — Telephone Encounter (Signed)
Please advise 

## 2020-09-17 ENCOUNTER — Other Ambulatory Visit: Payer: Self-pay | Admitting: Podiatry

## 2020-09-17 MED ORDER — HYDROCODONE-ACETAMINOPHEN 10-325 MG PO TABS: 1.0000 | ORAL_TABLET | Freq: Four times a day (QID) | ORAL | 0 refills | Status: DC | PRN

## 2020-09-17 NOTE — Telephone Encounter (Signed)
Spoke with patient and she said that she does not have any allergies to the pain  medication, has been taking for 2 weeks with no reactions. Please send refill to pharmacy on file.

## 2020-09-17 NOTE — Telephone Encounter (Signed)
Patient is calling about her pain medicine, still not at pharmacy and she is in a lot of pain, has been waiting since this morning.Please advise.  Patient is requesting that prescription be sent to [place]

## 2020-09-17 NOTE — Telephone Encounter (Signed)
Patient is calling for a refill of pain medicine,in a lot of pain, showing in epic w/ an allergy attached, not at pharmacy. Please advise.

## 2020-09-17 NOTE — Telephone Encounter (Signed)
Thank you :)

## 2020-09-17 NOTE — Telephone Encounter (Signed)
Please advise 

## 2020-09-18 ENCOUNTER — Other Ambulatory Visit: Payer: Self-pay

## 2020-09-18 ENCOUNTER — Encounter: Payer: Medicaid Other | Admitting: Podiatry

## 2020-09-18 ENCOUNTER — Ambulatory Visit (INDEPENDENT_AMBULATORY_CARE_PROVIDER_SITE_OTHER): Payer: Medicaid Other | Admitting: Podiatry

## 2020-09-18 ENCOUNTER — Ambulatory Visit (INDEPENDENT_AMBULATORY_CARE_PROVIDER_SITE_OTHER): Payer: Medicaid Other

## 2020-09-18 ENCOUNTER — Other Ambulatory Visit: Payer: Self-pay | Admitting: Podiatry

## 2020-09-18 DIAGNOSIS — S92352A Displaced fracture of fifth metatarsal bone, left foot, initial encounter for closed fracture: Secondary | ICD-10-CM

## 2020-09-18 DIAGNOSIS — E1165 Type 2 diabetes mellitus with hyperglycemia: Secondary | ICD-10-CM

## 2020-09-19 LAB — HGB A1C W/O EAG: Hgb A1c MFr Bld: 8.6 % — ABNORMAL HIGH (ref 4.8–5.6)

## 2020-09-20 ENCOUNTER — Other Ambulatory Visit: Payer: Self-pay | Admitting: Podiatry

## 2020-09-20 NOTE — Telephone Encounter (Signed)
Please Advise

## 2020-09-23 ENCOUNTER — Telehealth: Payer: Self-pay | Admitting: Podiatry

## 2020-09-23 ENCOUNTER — Encounter: Payer: Self-pay | Admitting: Internal Medicine

## 2020-09-23 ENCOUNTER — Other Ambulatory Visit: Payer: Self-pay | Admitting: Podiatry

## 2020-09-23 MED ORDER — HYDROCODONE-ACETAMINOPHEN 10-325 MG PO TABS: 1.0000 | ORAL_TABLET | Freq: Four times a day (QID) | ORAL | 0 refills | Status: DC | PRN

## 2020-09-23 NOTE — Telephone Encounter (Signed)
Please advise 

## 2020-09-23 NOTE — Telephone Encounter (Signed)
Patient called and stated that she has a refill request on pain meds. Please advise

## 2020-09-23 NOTE — Telephone Encounter (Signed)
Rx sent. Thanks

## 2020-09-23 NOTE — Telephone Encounter (Signed)
Patient is calling to request a refill of pain medicine," is hurting so bad." Please advise.

## 2020-09-25 ENCOUNTER — Encounter: Payer: Medicaid Other | Admitting: Podiatry

## 2020-09-26 ENCOUNTER — Other Ambulatory Visit: Payer: Self-pay | Admitting: Podiatry

## 2020-09-26 ENCOUNTER — Encounter: Payer: Self-pay | Admitting: Podiatry

## 2020-09-26 DIAGNOSIS — S92352A Displaced fracture of fifth metatarsal bone, left foot, initial encounter for closed fracture: Secondary | ICD-10-CM | POA: Diagnosis not present

## 2020-09-26 DIAGNOSIS — S92342A Displaced fracture of fourth metatarsal bone, left foot, initial encounter for closed fracture: Secondary | ICD-10-CM | POA: Diagnosis not present

## 2020-09-26 DIAGNOSIS — S92332A Displaced fracture of third metatarsal bone, left foot, initial encounter for closed fracture: Secondary | ICD-10-CM | POA: Diagnosis not present

## 2020-09-26 MED ORDER — DOXYCYCLINE HYCLATE 100 MG PO TABS: 100.0000 mg | ORAL_TABLET | Freq: Two times a day (BID) | ORAL | 0 refills | Status: DC

## 2020-09-26 MED ORDER — HYDROCODONE-ACETAMINOPHEN 10-325 MG PO TABS: 1.0000 | ORAL_TABLET | Freq: Four times a day (QID) | ORAL | 0 refills | Status: DC | PRN

## 2020-09-26 NOTE — Progress Notes (Signed)
PRN postop 

## 2020-09-30 ENCOUNTER — Telehealth: Payer: Self-pay | Admitting: *Deleted

## 2020-09-30 ENCOUNTER — Other Ambulatory Visit: Payer: Self-pay | Admitting: Podiatry

## 2020-09-30 MED ORDER — HYDROCODONE-ACETAMINOPHEN 10-325 MG PO TABS: 1.0000 | ORAL_TABLET | Freq: Four times a day (QID) | ORAL | 0 refills | Status: DC | PRN

## 2020-09-30 NOTE — Progress Notes (Signed)
Virtual Visit via Video Note   This visit type was conducted due to national recommendations for restrictions regarding the COVID-19 Pandemic (e.g. social distancing) in an effort to limit this patient's exposure and mitigate transmission in our community.  Due to her co-morbid illnesses, this patient is at least at moderate risk for complications without adequate follow up.  This format is felt to be most appropriate for this patient at this time.  All issues noted in this document were discussed and addressed.  A limited physical exam was performed with this format.  Please refer to the patient's chart for her consent to telehealth for [facility].      Date:  10/01/2020   ID:  [name], DOB [birth year], MRN [id number] The patient was identified using 2 identifiers.  Patient Location: Home Provider Location: Office/Clinic   PCP:  Coolidge Breeze, [facility] Providers Cardiologist:  Jenkins Rouge, MD     Evaluation Performed:  Follow-Up Visit  Chief Complaint:  F/u for CAD with angina   Patient Profile: Sandra [name] is a 45 y.o. female with: Coronary artery disease  Cath 5/22:  diffuse small vessel disease (diabetic); p-mRCA 70; occluded PDA w/ L-R collats; OM2 60 >> med Rx Myoview 6/22 ([facility]): no ischemia, EF 34 Myoview 2/22: no ischemia, EF 45, ?inf infarct  Echocardiogram 3/22: inf HK-AK, EF 50-55 Unable to get cMRI due to interstim implant  Hypertension  Hyperlipidemia  Diabetes mellitus  ADHD Bipolar d/o Bladder CA S/p interstim implant  GERD  Husband died due to COVID-19 in 05-Sep-2022 Allergic to ACEi (urticaria with benazepril)    Prior CV Studies: Myoview 06/21/20 ([facility]) FINAL COMMENTS   No evidence of inducible myocardial ischemia or myocardial infarction. LVEF 34%.   Echocardiogram 06/19/20 ([facility]) EF 43, mild LVH, mild RV systolic dysfunction, mild MR, mild TR, trivial TR  LEFT HEART CATH AND CORONARY ANGIOGRAPHY 05/31/2020 Narrative  Prox  RCA 30%, prox to mid RCA 70%, mid to dist RCA 50%; RPDA lesion is 100% stenosed.  Prox Cx 40%, Mid Cx 20%; OM1 30% stenosed, OM2 60%   Prox LAD 20%, mid LAD 20%  Ramus 30%   Multivessel CAD with diffuse small vessels in this diabetic female.  The LAD has mild 20% areas of narrowing.  The ramus intermediate vessel has 30% narrowing.  The circumflex vessel has a percent proximal narrowing on a bend in the vessel and then has 20 to 30% stenoses.  There is focal 60% stenosis in the second marginal vessel.  From the left system there is collateralization to the very distal PDA.  The right coronary artery is diffusely small vessel in its mid segment with focal narrowing of 70% after the anterior RV marginal branch.  The mid -distal PDA is occluded and collateralized from the left circulation.  RECOMMENDATION: Medical therapy for CAD with optimal blood pressure control, aggressive lipid management particularly with this patient with significant xanthelasmas on exam diabetic control.   Echocardiogram 03/27/20 Severe inferior HK/AK, EF 50-55, mild LVH, normal RVSF, trivial MR  NM Myocar Multi W/Spect W/Wall Motion / EF 03/07/2020 Narrative  No diagnostic ST segment changes indicate ischemia.  Small, mild intensity, apical septal and inferior basal defects that are partially reversible in the setting of significant breast attenuation and also radiotracer uptake near the inferior wall, most likely artifactual. No large ischemic territories.  This is a low risk study.  Nuclear stress EF: 45%, visually appears normal. Suggest echocardiogram.    History  of Present Illness:   Ms. Lightle was last seen by Dr. Johnsie Cancel in 7/22.  The pt has small vessel diabetic disease with mod prox to mid RCA, OM2 disease and a chronically occluded PDA and L-R collaterals.  Her chest pain is managed medically.  She did see Dr. Debara Pickett in 8/22 and was placed on [id number] Rx for probable FH.  She is seen for follow-up on  coronary artery disease with angina.  Unfortunately, she fell recently and fractured her left foot.  She had surgery last week.  It is difficult for her to get around.  She does note continued chest discomfort.  This often occurs in the mornings.  She does have radiation to her back as well as her left arm.  She does get it with exertion at times as well.  Overall, her symptoms are fairly stable.  She does note associated shortness of breath, diaphoresis, nausea.  She does not have as needed nitroglycerin.  When she gets her symptoms with exertion, she typically sits down to rest.  It may take up to 30 to 40 minutes to resolve.  Past Medical History:  Diagnosis Date   ADHD (attention deficit hyperactivity disorder)    Allergic rhinitis    Anemia    hx of   Bipolar 1 disorder (HCC)    Bladder tumor    Cancer ([place])    bladder   Dizziness    Full dentures    GERD (gastroesophageal reflux disease)    Gross hematuria    Hyperlipidemia    Hypertension    Type 2 diabetes mellitus (HCC)    Urgency of urination    dysuria, sui   Wears glasses    Past Surgical History:  Procedure Laterality Date   CYSTOSCOPY N/A 10/03/2015   Procedure: CYSTOSCOPY;  Surgeon: Irine Seal, MD;  Location[facility];  Service: Urology;  Laterality: N/A;   CYSTOSCOPY WITH STENT PLACEMENT Right 10/31/2015   Procedure: CYSTOSCOPY WITH ATTEMPTED RIGHT URETERAL OPENING;  Surgeon: Irine Seal, MD;  Location: [facility];  Service: Urology;  Laterality: Right;   INTERSTIM IMPLANT PLACEMENT N/A 03/14/2018   Procedure: Barrie Lyme IMPLANT FIRST STAGE;  Surgeon: Cleon Gustin, MD;  Location: [facility];  Service: Urology;  Laterality: N/A;   INTERSTIM IMPLANT PLACEMENT N/A 03/30/2018   Procedure: Barrie Lyme IMPLANT SECOND STAGE;  Surgeon: Cleon Gustin, MD;  Location: [facility];  Service: Urology;  Laterality: N/A;   LEFT HEART CATH AND CORONARY ANGIOGRAPHY N/A 05/31/2020   Procedure: LEFT HEART  CATH AND CORONARY ANGIOGRAPHY;  Surgeon: Troy Sine, MD;  Location: [facility];  Service: Cardiovascular;  Laterality: N/A;   MULTIPLE TOOTH EXTRACTIONS  2015   OVARIAN CYST REMOVAL Right 2004 approx   SHOULDER OPEN ROTATOR CUFF REPAIR Left 04/20/2019   Procedure: ROTATOR CUFF REPAIR SHOULDER OPEN;  Surgeon: Carole Civil, MD;  Location: [facility];  Service: Orthopedics;  Laterality: Left;   TONSILLECTOMY  12/30/2004   TOTAL ABDOMINAL HYSTERECTOMY W/ BILATERAL SALPINGOOPHORECTOMY  05/16/2004   TRANSURETHRAL RESECTION OF BLADDER TUMOR N/A 10/03/2015   Procedure: TRANSURETHRAL RESECTION OF BLADDER TUMOR (TURBT);  Surgeon: Irine Seal, MD;  Location: [facility];  Service: Urology;  Laterality: N/A;   TRANSURETHRAL RESECTION OF BLADDER TUMOR N/A 10/31/2015   Procedure: RE-STAGINGG TRANSURETHRAL RESECTION OF BLADDER TUMOR (TURBT);  Surgeon: Irine Seal, MD;  Location: [facility];  Service: Urology;  Laterality: N/A;   TRANSURETHRAL RESECTION OF BLADDER TUMOR N/A  08/17/2016   Procedure: TRANSURETHRAL RESECTION OF BLADDER TUMOR (TURBT);  Surgeon: Cleon Gustin, MD;  Location: [facility];  Service: Urology;  Laterality: N/A;     Current Meds  Medication Sig   Accu-Chek Softclix Lancets lancets Use as instructed to monitor glucose 4 times daily   amLODipine (NORVASC) 5 MG tablet Take 1 tablet (5 mg total) by mouth daily.   Blood Glucose Monitoring Suppl (ACCU-CHEK GUIDE) w/Device KIT 1 Piece by Does not apply route as directed.   buPROPion (WELLBUTRIN XL) 300 MG 24 hr tablet Take 300 mg by mouth every morning.   cloNIDine HCl (KAPVAY) 0.1 MG TB12 ER tablet Take 0.1 mg by mouth every evening.   Continuous Blood Gluc Sensor (DEXCOM G6 SENSOR) MISC Apply new sensor every 10 days as directed   Continuous Blood Gluc Transmit (DEXCOM G6 TRANSMITTER) MISC 1 Device by Does not apply route every 3 (three) months.   diclofenac (VOLTAREN) 75 MG EC tablet Take 75 mg by  mouth 2 (two) times daily.   doxycycline (VIBRA-TABS) 100 MG tablet Take 1 tablet (100 mg total) by mouth 2 (two) times daily.   Evolocumab (REPATHA SURECLICK) 182 MG/ML SOAJ Inject 1 Dose into the skin every 14 (fourteen) days.   fenofibrate (TRICOR) 145 MG tablet Take 145 mg by mouth daily.   FLUoxetine (PROZAC) 20 MG capsule Take 20 mg by mouth daily.   FLUoxetine (PROZAC) 40 MG capsule Take 40 mg by mouth daily.   gabapentin (NEURONTIN) 300 MG capsule TAKE 1 CAPSULE(300 MG) BY MOUTH THREE TIMES DAILY   glipiZIDE (GLUCOTROL XL) 5 MG 24 hr tablet Take 1 tablet (5 mg total) by mouth daily with breakfast.   glucose blood (ACCU-CHEK GUIDE) test strip Use as instructed to monitor glucose 4 times daily, before meals and before bed.   HYDROcodone-acetaminophen (NORCO) 10-325 MG tablet Take 1 tablet by mouth every 6 (six) hours as needed.   hydrocortisone (ANUSOL-HC) 2.5 % rectal cream Place 1 application rectally 2 (two) times daily.   insulin aspart (NOVOLOG) 100 UNIT/ML FlexPen Inject 10-16 Units into the skin 3 (three) times daily with meals.   insulin glargine (LANTUS) 100 UNIT/ML Solostar Pen Inject 40 Units into the skin at bedtime.   Insulin Pen Needle (PEN NEEDLES) 33G X 4 MM MISC 1 each by Does not apply route in the morning, at noon, in the evening, and at bedtime.   isosorbide mononitrate (IMDUR) 60 MG 24 hr tablet Take 1 tablet (60 mg total) by mouth daily.   meloxicam (MOBIC) 7.5 MG tablet Take 7.5 mg by mouth daily as needed for pain.   metFORMIN (GLUCOPHAGE) 1000 MG tablet Take 1 tablet (1,000 mg total) by mouth 2 (two) times daily with a meal.   metoprolol succinate (TOPROL-XL) 50 MG 24 hr tablet Take 1 tablet (50 mg total) by mouth daily. Take with or immediately following a meal.   montelukast (SINGULAIR) 10 MG tablet Take 10 mg by mouth at bedtime.   nitroGLYCERIN (NITROSTAT) 0.4 MG SL tablet Place 1 tablet (0.4 mg total) under the tongue every 5 (five) minutes as needed for chest  pain.   pantoprazole (PROTONIX) 40 MG tablet Take 1 tablet (40 mg total) by mouth 2 (two) times daily before a meal.   rosuvastatin (CRESTOR) 40 MG tablet Take 40 mg by mouth daily.   VYVANSE 40 MG capsule Take 40 mg by mouth every morning.   [DISCONTINUED] isosorbide mononitrate (IMDUR) 30 MG 24 hr tablet Take 1 tablet (30  mg total) by mouth daily.   [DISCONTINUED] metoprolol succinate (TOPROL XL) 25 MG 24 hr tablet Take 1 tablet (25 mg total) by mouth daily.   Current Facility-Administered Medications for the 10/01/20 encounter (Video Visit) with Richardson Dopp T, PA-C  Medication   betamethasone acetate-betamethasone sodium phosphate (CELESTONE) injection 3 mg     Allergies:   Ibuprofen, Benazepril, Diphenhydramine, Metronidazole, Oxycodone, Adhesive [tape], Codeine, Loratadine, Nystatin-triamcinolone, and Penicillins   Social History   Tobacco Use   Smoking status: Former    Packs/day: 0.50    Years: 10.00    Pack years: 5.00    Types: Cigarettes    Quit date: 11/12/2019    Years since quitting: 0.8   Smokeless tobacco: Never  Vaping Use   Vaping Use: Never used  Substance Use Topics   Alcohol use: No   Drug use: No     Family Hx: The patient's family history includes Breast cancer in her maternal aunt; Cancer in her mother and another family member; Diabetes in her daughter; Hypertension in her mother; Lung cancer in her maternal uncle. There is no history of Colon cancer.  ROS:   Please see the history of present illness.   She has not had melena, hematochezia, hemoptysis.   Labs/Other Tests and Data Reviewed:    EKG:  No ECG reviewed.  Recent Labs: 05/29/2020: BUN 11; Creatinine, Ser 0.64; Hemoglobin 14.3; Platelets 383; Potassium 4.0; Sodium 133 06/05/2020: ALT 19   Recent Lipid Panel Lab Results  Component Value Date/Time   CHOL 246 (H) 06/05/2020 08:53 AM   TRIG 273 (H) 06/05/2020 08:53 AM   HDL 31 (L) 06/05/2020 08:53 AM   CHOLHDL 7.9 (H) 06/05/2020 08:53  AM   LDLCALC 163 (H) 06/05/2020 08:53 AM    Wt Readings from Last 3 Encounters:  10/01/20 159 lb (72.1 kg)  08/22/20 156 lb (70.8 kg)  07/24/20 163 lb (73.9 kg)     Risk Assessment/Calculations:          Objective:    Vital Signs:  BP (!) 159/96   Pulse 86   Ht 5' (1.524 m)   Wt 159 lb (72.1 kg)   BMI 31.05 kg/m    VITAL SIGNS:  reviewed GEN:  no acute distress RESPIRATORY:  normal respiratory effort, symmetric expansion NEURO:  alert and oriented x 3, no obvious focal deficit  ASSESSMENT & PLAN:    1. Coronary artery disease involving native coronary artery of native heart with angina pectoris [facility]) Cardiac catheterization in 5/22 with diffuse small vessel diabetic disease.  She has moderate disease in the proximal to mid RCA and occluded PDA with left-to-right collaterals as well as moderate disease in OM 2.  She has been managed medically.  She continues to have chest discomfort in the morning as well as with exertion.  Overall, her angina seems to be fairly stable.  She describes CCS class II-III anginal symptoms.  Her blood pressure is out of control.  I have recommended that we increase her isosorbide to 60 mg daily and metoprolol succinate to 50 mg daily.  I will give her a prescription for as needed nitroglycerin.  I have asked her to monitor her blood pressure over the next couple weeks and send me those readings through [place].  If her blood pressure is not at target, I will increase her amlodipine to 10 mg daily.  Plan follow-up either virtually or in person with me or Dr. Johnsie Cancel in the next 3 to 4 weeks.  We discussed symptoms that would prompt emergent attention.  2. Familial hypercholesterolemia She has now been placed on evolocumab in addition to rosuvastatin, fenofibrate.  Follow-up with Dr. Debara Pickett as planned.  3. Essential hypertension Blood pressure is out of control.  Continue amlodipine 5 mg daily.  Increase isosorbide to 60 mg daily and metoprolol succinate  50 mg daily as outlined above.  I will adjust her medications further based upon follow-up readings in 1 week.  4. Uncontrolled type 2 diabetes mellitus with hyperglycemia ([place]) Continue follow-up with primary care.   Time:   Today, I have spent 11 minutes with the patient with telehealth technology discussing the above problems.    Medication Adjustments/Labs and Tests Ordered: Current medicines are reviewed at length with the patient today.  Concerns regarding medicines are outlined above.   Tests Ordered: No orders of the defined types were placed in this encounter. Medication Changes: Meds ordered this encounter  Medications   metoprolol succinate (TOPROL-XL) 50 MG 24 hr tablet    Sig: Take 1 tablet (50 mg total) by mouth daily. Take with or immediately following a meal.    Dispense:  90 tablet    Refill:  3   isosorbide mononitrate (IMDUR) 60 MG 24 hr tablet    Sig: Take 1 tablet (60 mg total) by mouth daily.    Dispense:  90 tablet    Refill:  3   nitroGLYCERIN (NITROSTAT) 0.4 MG SL tablet    Sig: Place 1 tablet (0.4 mg total) under the tongue every 5 (five) minutes as needed for chest pain.    Dispense:  25 tablet    Refill:  3    Follow Up:   Either in person or virtual  in 4 week(s)  Signed, Richardson Dopp, PA-C  10/01/2020 8:41 AM    [place]

## 2020-09-30 NOTE — Telephone Encounter (Signed)
Returned the call to patient and informed that medication has been sent to pharmacy, giving Dr Amalia Hailey recommendations to ice and elevate, verbalized understanding and said that she would.

## 2020-09-30 NOTE — Telephone Encounter (Signed)
Rx sent 

## 2020-09-30 NOTE — Telephone Encounter (Signed)
Patient called because she is in a lot of pain after falling in hallway this morning,has not noticed any more bleeding on bandages.   Returned call to patient and instructed to continue to rest,ice and elevate the foot and will send additional pain meds if needed per Dr Amalia Hailey. She verbalized understanding and would like the additional pain medicine.

## 2020-09-30 NOTE — Progress Notes (Signed)
HPI: 45 y.o. female presenting today for follow-up treatment regarding an injury that the patient sustained on 08/22/2020 when she fell at her home in the kitchen.  She denies loss of consciousness or any head trauma.  Patient went to an urgent care and was diagnosed with fractures of the foot.    Patient states that she has been wearing a cam boot since last visit.  She continues to have pain associated to the area.  She presents today for follow-up treatment and evaluation. Past Medical History:  Diagnosis Date   ADHD (attention deficit hyperactivity disorder)    Allergic rhinitis    Anemia    hx of   Bipolar 1 disorder (HCC)    Bladder tumor    Cancer ([place])    bladder   Dizziness    Full dentures    GERD (gastroesophageal reflux disease)    Gross hematuria    Hyperlipidemia    Hypertension    Type 2 diabetes mellitus (HCC)    Urgency of urination    dysuria, sui   Wears glasses      Physical Exam: General: The patient is alert and oriented x3 in no acute distress.  Dermatology: Skin is warm, dry and supple bilateral lower extremities. Negative for open lesions or macerations.  Vascular: Palpable pedal pulses bilaterally.  No erythema.  Edema noted left forefoot capillary refill within normal limits.  Neurological: Epicritic and protective threshold diminished bilaterally.   Musculoskeletal Exam: Improved edema with pain on palpation throughout the entire forefoot.  Ecchymosis also noted  Radiographic Exam:  Close displaced fractures noted to the third fourth and fifth metatarsal heads with displacement.  Mostly unchanged since prior visit  Assessment: 1.  Closed, displaced fractures metatarsals 3, 4, 5 left foot, subsequent encounter 2.  Diabetes mellitus with peripheral polyneuropathy   Plan of Care:  1. Patient evaluated. X-Rays reviewed again today.  2.  Today we discussed again the surgical management of the patient.  Despite the patient's elevated blood  glucose levels the patient needs surgery in order to alleviate her symptoms due to the severely displaced metatarsal heads.  After reviewing the case again with other physicians she may benefit from simple metatarsal head resections versus ORIF.  Metatarsal head resections will allow for quicker healing and less complications.  I explained the procedures and in detail to the patient and the risks and benefits associated to her diabetes.  The patient is adamant that her glucose levels have improved significantly.  She is now monitoring her blood sugar daily and she says there is been significant improvement 3.  New order placed today for A1c 4.  Despite the patient's diabetes we are going to proceed with surgery which has been modified today to metatarsal head resections 4 and 5 left foot with possible metatarsal head resection third left versus ORIF third left. 5.  Again, all possible complications and details of procedure were explained.  No guarantees were expressed or implied.  All patient questions answered. 6.  Return to clinic 1 week postop      Edrick Kins, DPM [facility]  Dr. Edrick Kins, DPM    [address], [place] [address]  Office [phone]  Fax [phone]

## 2020-10-01 ENCOUNTER — Other Ambulatory Visit: Payer: Self-pay

## 2020-10-01 ENCOUNTER — Telehealth (INDEPENDENT_AMBULATORY_CARE_PROVIDER_SITE_OTHER): Payer: Medicaid Other | Admitting: Physician Assistant

## 2020-10-01 ENCOUNTER — Encounter: Payer: Self-pay | Admitting: Physician Assistant

## 2020-10-01 ENCOUNTER — Ambulatory Visit: Payer: Medicaid Other | Admitting: Physician Assistant

## 2020-10-01 VITALS — BP 159/96 | HR 86 | Ht 60.0 in | Wt 159.0 lb

## 2020-10-01 DIAGNOSIS — I25119 Atherosclerotic heart disease of native coronary artery with unspecified angina pectoris: Secondary | ICD-10-CM

## 2020-10-01 DIAGNOSIS — I10 Essential (primary) hypertension: Secondary | ICD-10-CM

## 2020-10-01 DIAGNOSIS — E7801 Familial hypercholesterolemia: Secondary | ICD-10-CM | POA: Diagnosis not present

## 2020-10-01 DIAGNOSIS — E1165 Type 2 diabetes mellitus with hyperglycemia: Secondary | ICD-10-CM | POA: Diagnosis not present

## 2020-10-01 MED ORDER — NITROGLYCERIN 0.4 MG SL SUBL: 0.4000 mg | SUBLINGUAL_TABLET | SUBLINGUAL | 3 refills | Status: DC | PRN

## 2020-10-01 MED ORDER — METOPROLOL SUCCINATE ER 50 MG PO TB24: 50.0000 mg | ORAL_TABLET | Freq: Every day | ORAL | 3 refills | Status: DC

## 2020-10-01 MED ORDER — ISOSORBIDE MONONITRATE ER 60 MG PO TB24: 60.0000 mg | ORAL_TABLET | Freq: Every day | ORAL | 3 refills | Status: DC

## 2020-10-01 NOTE — Patient Instructions (Signed)
Medication Instructions:  Your physician has recommended you make the following change in your medication:   INCREASE the Metoprolol to 50 mg taking 1 daily  INCREASE the Imdur to 60 taking 1 daily   Start Nitroglycerin 0.4 s/l tablets taking only as needed for chest pain    *If you need a refill on your cardiac medications before your next appointment, please call your pharmacy*   Lab Work: None ordered  If you have labs (blood work) drawn today and your tests are completely normal, you will receive your results only by: [place] (if you have MyChart) OR A paper copy in the mail If you have any lab test that is abnormal or we need to change your treatment, we will call you to review the results.   Testing/Procedures: None ordered   Follow-Up: At [facility], you and your health needs are our priority.  As part of our continuing mission to provide you with exceptional heart care, we have created designated Provider Care Teams.  These Care Teams include your primary Cardiologist (physician) and Advanced Practice Providers (APPs -  Physician Assistants and Nurse Practitioners) who all work together to provide you with the care you need, when you need it.  We recommend signing up for the patient portal called "MyChart".  Sign up information is provided on this After Visit Summary.  MyChart is used to connect with patients for Virtual Visits (Telemedicine).  Patients are able to view lab/test results, encounter notes, upcoming appointments, etc.  Non-urgent messages can be sent to your provider as well.   To learn more about what you can do with MyChart, go to [email or web address].    Your next appointment:   10/25/2020 ARRIVE AT 11:00  The format for your next appointment:   In Person  Provider:   Richardson Dopp, PA-C   Other Instructions Monitor your blood pressure for 1 week. Send [place] a [employer] along with how you are feeling.

## 2020-10-02 ENCOUNTER — Ambulatory Visit (INDEPENDENT_AMBULATORY_CARE_PROVIDER_SITE_OTHER): Payer: Medicaid Other

## 2020-10-02 ENCOUNTER — Other Ambulatory Visit: Payer: Self-pay

## 2020-10-02 ENCOUNTER — Ambulatory Visit (INDEPENDENT_AMBULATORY_CARE_PROVIDER_SITE_OTHER): Payer: Medicaid Other | Admitting: Podiatry

## 2020-10-02 DIAGNOSIS — Z9889 Other specified postprocedural states: Secondary | ICD-10-CM

## 2020-10-02 DIAGNOSIS — S92352A Displaced fracture of fifth metatarsal bone, left foot, initial encounter for closed fracture: Secondary | ICD-10-CM | POA: Diagnosis not present

## 2020-10-02 MED ORDER — HYDROCODONE-ACETAMINOPHEN 10-325 MG PO TABS: 1.0000 | ORAL_TABLET | ORAL | 0 refills | Status: DC | PRN

## 2020-10-02 NOTE — Progress Notes (Addendum)
   Subjective:  Patient presents today status post metatarsal head resection 3, 4, 5 left foot. DOS: 09/26/2020.  Patient states she is doing well.  The pain is tolerable with the hydrocodone.  She presents for further treatment and evaluation  Past Medical History:  Diagnosis Date   ADHD (attention deficit hyperactivity disorder)    Allergic rhinitis    Anemia    hx of   Bipolar 1 disorder (HCC)    Bladder tumor    Cancer ([place])    bladder   Dizziness    Full dentures    GERD (gastroesophageal reflux disease)    Gross hematuria    Hyperlipidemia    Hypertension    Type 2 diabetes mellitus ([place])    Urgency of urination    dysuria, sui   Wears glasses       Objective/Physical Exam Neurovascular status intact.  Skin incisions appear to be well coapted with staples intact. No sign of infectious process noted. No dehiscence. No active bleeding noted. Moderate edema noted to the surgical extremity.  Radiographic Exam:  Percutaneous fixation pins and osteotomies sites appear to be stable with routine healing.  There is some radiolucencies along the dorsum of the foot on radiographic exam which I suspect is postsurgical versus any infectious process or gas gangrene.  Clinically there is no infection associated to the area  Assessment: 1. s/p metatarsal head resections 3, 4, 5 left. DOS: 09/26/2020   Plan of Care:  1. Patient was evaluated. X-rays reviewed 2.  Dressings changed.  Clean dry and intact x1 week 3.  Patient may be continue to weight-bear in the postsurgical shoe 4.  Refill prescription for Vicodin 10/325 mg #30 every 4 hours as needed pain  5.  Return to clinic 1 week for staple removal with another physician.  I would not be in the office next week so I will see her 3 weeks from next visit for percutaneous fixation pin removal and follow-up x-rays   Edrick Kins, DPM [facility]  Dr. Edrick Kins, DPM    [address], [place] [address]                Office [phone]  Fax [phone]

## 2020-10-07 ENCOUNTER — Other Ambulatory Visit: Payer: Self-pay | Admitting: Podiatry

## 2020-10-07 NOTE — Telephone Encounter (Signed)
Called patient, no answer, left vmessage that medication has been approved and sent to pharmacy on file.

## 2020-10-07 NOTE — Telephone Encounter (Signed)
Patient is calling to request a refill of (hydrocodone-ace, 10-325 mg),last refill on 10/02/20.Please advise.

## 2020-10-11 ENCOUNTER — Telehealth: Payer: Self-pay | Admitting: *Deleted

## 2020-10-11 MED ORDER — HYDROCODONE-ACETAMINOPHEN 10-325 MG PO TABS: ORAL_TABLET | ORAL | 0 refills | Status: DC

## 2020-10-11 NOTE — Telephone Encounter (Signed)
I sent a 5 day supply on 9/26. Additional 5 day rx sent to pharmacy with first fill tomorrow

## 2020-10-11 NOTE — Telephone Encounter (Signed)
Patient is calling to request a pain medicine refill. Stated that she is still in a lot of pain.Please advise

## 2020-10-14 ENCOUNTER — Encounter: Payer: Medicaid Other | Admitting: Podiatry

## 2020-10-14 ENCOUNTER — Ambulatory Visit (INDEPENDENT_AMBULATORY_CARE_PROVIDER_SITE_OTHER): Payer: Medicaid Other

## 2020-10-14 ENCOUNTER — Other Ambulatory Visit: Payer: Self-pay

## 2020-10-14 ENCOUNTER — Ambulatory Visit (INDEPENDENT_AMBULATORY_CARE_PROVIDER_SITE_OTHER): Payer: Medicaid Other | Admitting: Podiatry

## 2020-10-14 DIAGNOSIS — S92342A Displaced fracture of fourth metatarsal bone, left foot, initial encounter for closed fracture: Secondary | ICD-10-CM | POA: Diagnosis not present

## 2020-10-14 DIAGNOSIS — Z9889 Other specified postprocedural states: Secondary | ICD-10-CM

## 2020-10-14 MED ORDER — HYDROCODONE-ACETAMINOPHEN 10-325 MG PO TABS: 1.0000 | ORAL_TABLET | Freq: Four times a day (QID) | ORAL | 0 refills | Status: DC | PRN

## 2020-10-17 ENCOUNTER — Other Ambulatory Visit: Payer: Self-pay | Admitting: Podiatry

## 2020-10-18 NOTE — Telephone Encounter (Signed)
Patient is calling for a refill of her pain medicine,does not want to go without, last refill on 10/14/20,picked up per pharmacy.

## 2020-10-21 NOTE — Progress Notes (Signed)
   Subjective:  Patient presents today status post metatarsal head resection 3, 4, 5 left foot. DOS: 09/26/2020.  Patient states she is doing well.  The pain is tolerable with the hydrocodone.  She presents for further treatment and evaluation  Past Medical History:  Diagnosis Date   ADHD (attention deficit hyperactivity disorder)    Allergic rhinitis    Anemia    hx of   Bipolar 1 disorder (HCC)    Bladder tumor    Cancer ([place])    bladder   Dizziness    Full dentures    GERD (gastroesophageal reflux disease)    Gross hematuria    Hyperlipidemia    Hypertension    Type 2 diabetes mellitus ([place])    Urgency of urination    dysuria, sui   Wears glasses       Objective/Physical Exam Neurovascular status intact.  Skin incisions appear to be well coapted with staples intact. No sign of infectious process noted. No dehiscence. No active bleeding noted. Moderate edema noted to the surgical extremity.  Radiographic Exam:  Percutaneous fixation pins and osteotomies sites appear to be stable with routine healing.  Radiolucency along the dorsum of the foot appears to be resolved  Assessment: 1. s/p metatarsal head resections 3, 4, 5 left. DOS: 09/26/2020   Plan of Care:  1. Patient was evaluated.  Staples removed today 2.  Patient may begin washing and showering and getting foot wet  3.  Patient may be continue to weight-bear in the postsurgical shoe 4.  Refill prescription for Vicodin 10/325 mg #30 every 4 hours as needed pain  5.  Return to clinic in 4 weeks for follow-up x-ray   Edrick Kins, DPM [facility]  Dr. Edrick Kins, DPM    [address], [place] [address]                Office [phone]  Fax [phone]

## 2020-10-23 NOTE — Patient Instructions (Signed)
Diabetes Mellitus and Nutrition, Adult When you have diabetes, or diabetes mellitus, it is very important to have healthy eating habits because your blood sugar (glucose) levels are greatly affected by what you eat and drink. Eating healthy foods in the right amounts, at about the same times every day, can help you:  Control your blood glucose.  Lower your risk of heart disease.  Improve your blood pressure.  Reach or maintain a healthy weight. What can affect my meal plan? Every person with diabetes is different, and each person has different needs for a meal plan. Your health care provider may recommend that you work with a dietitian to make a meal plan that is best for you. Your meal plan may vary depending on factors such as:  The calories you need.  The medicines you take.  Your weight.  Your blood glucose, blood pressure, and cholesterol levels.  Your activity level.  Other health conditions you have, such as heart or kidney disease. How do carbohydrates affect me? Carbohydrates, also called carbs, affect your blood glucose level more than any other type of food. Eating carbs naturally raises the amount of glucose in your blood. Carb counting is a method for keeping track of how many carbs you eat. Counting carbs is important to keep your blood glucose at a healthy level, especially if you use insulin or take certain oral diabetes medicines. It is important to know how many carbs you can safely have in each meal. This is different for every person. Your dietitian can help you calculate how many carbs you should have at each meal and for each snack. How does alcohol affect me? Alcohol can cause a sudden decrease in blood glucose (hypoglycemia), especially if you use insulin or take certain oral diabetes medicines. Hypoglycemia can be a life-threatening condition. Symptoms of hypoglycemia, such as sleepiness, dizziness, and confusion, are similar to symptoms of having too much  alcohol.  Do not drink alcohol if: ? Your health care provider tells you not to drink. ? You are pregnant, may be pregnant, or are planning to become pregnant.  If you drink alcohol: ? Do not drink on an empty stomach. ? Limit how much you use to:  0-1 drink a day for women.  0-2 drinks a day for men. ? Be aware of how much alcohol is in your drink. In the [place] one drink equals one 12 oz bottle of beer (355 mL), one 5 oz glass of wine (148 mL), or one 1 oz glass of hard liquor (44 mL). ? Keep yourself hydrated with water, diet soda, or unsweetened iced tea.  Keep in mind that regular soda, juice, and other mixers may contain a lot of sugar and must be counted as carbs. What are tips for following this plan? Reading food labels  Start by checking the serving size on the "Nutrition Facts" label of packaged foods and drinks. The amount of calories, carbs, fats, and other nutrients listed on the label is based on one serving of the item. Many items contain more than one serving per package.  Check the total grams (g) of carbs in one serving. You can calculate the number of servings of carbs in one serving by dividing the total carbs by 15. For example, if a food has 30 g of total carbs per serving, it would be equal to 2 servings of carbs.  Check the number of grams (g) of saturated fats and trans fats in one serving. Choose foods that have   a low amount or none of these fats.  Check the number of milligrams (mg) of salt (sodium) in one serving. Most people should limit total sodium intake to less than 2,300 mg per day.  Always check the nutrition information of foods labeled as "low-fat" or "nonfat." These foods may be higher in added sugar or refined carbs and should be avoided.  Talk to your dietitian to identify your daily goals for nutrients listed on the label. Shopping  Avoid buying canned, pre-made, or processed foods. These foods tend to be high in fat, sodium, and added  sugar.  Shop around the outside edge of the grocery store. This is where you will most often find fresh fruits and vegetables, bulk grains, fresh meats, and fresh dairy. Cooking  Use low-heat cooking methods, such as baking, instead of high-heat cooking methods like deep frying.  Cook using healthy oils, such as olive, canola, or sunflower oil.  Avoid cooking with butter, cream, or high-fat meats. Meal planning  Eat meals and snacks regularly, preferably at the same times every day. Avoid going long periods of time without eating.  Eat foods that are high in fiber, such as fresh fruits, vegetables, beans, and whole grains. Talk with your dietitian about how many servings of carbs you can eat at each meal.  Eat 4-6 oz (112-168 g) of lean protein each day, such as lean meat, chicken, fish, eggs, or tofu. One ounce (oz) of lean protein is equal to: ? 1 oz (28 g) of meat, chicken, or fish. ? 1 egg. ?  cup (62 g) of tofu.  Eat some foods each day that contain healthy fats, such as avocado, nuts, seeds, and fish.   What foods should I eat? Fruits Berries. Apples. Oranges. Peaches. Apricots. Plums. Grapes. Mango. Papaya. Pomegranate. Kiwi. Cherries. Vegetables Lettuce. Spinach. Leafy greens, including kale, chard, collard greens, and mustard greens. Beets. Cauliflower. Cabbage. Broccoli. Carrots. Green beans. Tomatoes. Peppers. Onions. Cucumbers. Brussels sprouts. Grains Whole grains, such as whole-wheat or whole-grain bread, crackers, tortillas, cereal, and pasta. Unsweetened oatmeal. Quinoa. Brown or wild rice. Meats and other proteins Seafood. Poultry without skin. Lean cuts of poultry and beef. Tofu. Nuts. Seeds. Dairy Low-fat or fat-free dairy products such as milk, yogurt, and cheese. The items listed above may not be a complete list of foods and beverages you can eat. Contact a dietitian for more information. What foods should I avoid? Fruits Fruits canned with  syrup. Vegetables Canned vegetables. Frozen vegetables with butter or cream sauce. Grains Refined white flour and flour products such as bread, pasta, snack foods, and cereals. Avoid all processed foods. Meats and other proteins Fatty cuts of meat. Poultry with skin. Breaded or fried meats. Processed meat. Avoid saturated fats. Dairy Full-fat yogurt, cheese, or milk. Beverages Sweetened drinks, such as soda or iced tea. The items listed above may not be a complete list of foods and beverages you should avoid. Contact a dietitian for more information. Questions to ask a health care provider  Do I need to meet with a diabetes educator?  Do I need to meet with a dietitian?  What number can I call if I have questions?  When are the best times to check my blood glucose? Where to find more information:  American Diabetes Association: diabetes.org  Academy of Nutrition and Dietetics: www.eatright.org  National Institute of Diabetes and Digestive and Kidney Diseases: www.niddk.nih.gov  Association of Diabetes Care and Education Specialists: www.diabeteseducator.org Summary  It is important to have healthy eating   habits because your blood sugar (glucose) levels are greatly affected by what you eat and drink.  A healthy meal plan will help you control your blood glucose and maintain a healthy lifestyle.  Your health care provider may recommend that you work with a dietitian to make a meal plan that is best for you.  Keep in mind that carbohydrates (carbs) and alcohol have immediate effects on your blood glucose levels. It is important to count carbs and to use alcohol carefully. This information is not intended to replace advice given to you by your health care provider. Make sure you discuss any questions you have with your health care provider. Document Revised: 12/06/2018 Document Reviewed: 12/06/2018 Elsevier Patient Education  2021 Elsevier Inc.  

## 2020-10-24 ENCOUNTER — Other Ambulatory Visit: Payer: Self-pay

## 2020-10-24 ENCOUNTER — Encounter: Payer: Self-pay | Admitting: Nurse Practitioner

## 2020-10-24 ENCOUNTER — Ambulatory Visit (INDEPENDENT_AMBULATORY_CARE_PROVIDER_SITE_OTHER): Payer: Medicaid Other | Admitting: Nurse Practitioner

## 2020-10-24 VITALS — BP 154/89 | HR 73 | Ht 60.0 in | Wt 167.2 lb

## 2020-10-24 DIAGNOSIS — I10 Essential (primary) hypertension: Secondary | ICD-10-CM

## 2020-10-24 DIAGNOSIS — E1165 Type 2 diabetes mellitus with hyperglycemia: Secondary | ICD-10-CM | POA: Diagnosis not present

## 2020-10-24 DIAGNOSIS — E782 Mixed hyperlipidemia: Secondary | ICD-10-CM | POA: Diagnosis not present

## 2020-10-24 MED ORDER — INSULIN ASPART 100 UNIT/ML FLEXPEN: 10.0000 [IU] | PEN_INJECTOR | Freq: Three times a day (TID) | SUBCUTANEOUS | 3 refills | Status: DC

## 2020-10-24 MED ORDER — INSULIN GLARGINE 100 UNIT/ML SOLOSTAR PEN: 30.0000 [IU] | PEN_INJECTOR | Freq: Every day | SUBCUTANEOUS | 3 refills | Status: DC

## 2020-10-24 MED ORDER — GLIPIZIDE ER 5 MG PO TB24: 5.0000 mg | ORAL_TABLET | Freq: Every day | ORAL | 3 refills | Status: DC

## 2020-10-24 MED ORDER — METFORMIN HCL 1000 MG PO TABS: 1000.0000 mg | ORAL_TABLET | Freq: Two times a day (BID) | ORAL | 1 refills | Status: DC

## 2020-10-24 NOTE — Progress Notes (Deleted)
Cardiology Office Note:    Date:  10/24/2020   ID:  [name], DOB 1975/08/22, MRN [id number]  PCP:  Coolidge Breeze, [place] Providers Cardiologist:  Jenkins Rouge, MD { Click to update primary MD,subspecialty MD or APP then REFRESH:1}  *** Referring MD: Coolidge Breeze, FNP   Chief Complaint:  No chief complaint on file. {Click here for Visit Info    :1}   Patient Profile:   Sandra [name] is a 45 y.o. female with:  ***  Prior CV studies: Coronary artery disease  Cath 5/22:  diffuse small vessel disease (diabetic); p-mRCA 70; occluded PDA w/ L-R collats; OM2 60 >> med Rx Myoview 6/22 ([facility]): no ischemia, EF 34 Myoview 2/22: no ischemia, EF 45, ?inf infarct  Echocardiogram 3/22: inf HK-AK, EF 50-55 Unable to get cMRI due to interstim implant  Hypertension  Hyperlipidemia  Diabetes mellitus  ADHD Bipolar d/o Bladder CA S/p interstim implant  GERD  Husband died due to COVID-19 in 09/22/2022 Allergic to ACEi (urticaria with benazepril)      Prior CV Studies: Myoview 06/21/20 ([facility]) FINAL COMMENTS   No evidence of inducible myocardial ischemia or myocardial infarction. LVEF 34%.    Echocardiogram 06/19/20 ([facility]) EF 43, mild LVH, mild RV systolic dysfunction, mild MR, mild TR, trivial TR   LEFT HEART CATH AND CORONARY ANGIOGRAPHY 05/31/2020 Narrative  Prox RCA 30%, prox to mid RCA 70%, mid to dist RCA 50%; RPDA lesion is 100% stenosed.  Prox Cx 40%, Mid Cx 20%; OM1 30% stenosed, OM2 60%   Prox LAD 20%, mid LAD 20%  Ramus 30%    Multivessel CAD with diffuse small vessels in this diabetic female.  The LAD has mild 20% areas of narrowing.  The ramus intermediate vessel has 30% narrowing.  The circumflex vessel has a percent proximal narrowing on a bend in the vessel and then has 20 to 30% stenoses.  There is focal 60% stenosis in the second marginal vessel.  From the left system there is collateralization to the very distal PDA.  The right coronary artery  is diffusely small vessel in its mid segment with focal narrowing of 70% after the anterior RV marginal branch.  The mid -distal PDA is occluded and collateralized from the left circulation.   RECOMMENDATION: Medical therapy for CAD with optimal blood pressure control, aggressive lipid management particularly with this patient with significant xanthelasmas on exam diabetic control.   Echocardiogram 03/27/20 Severe inferior HK/AK, EF 50-55, mild LVH, normal RVSF, trivial MR   NM Myocar Multi W/Spect W/Wall Motion / EF 03/07/2020 Narrative  No diagnostic ST segment changes indicate ischemia.  Small, mild intensity, apical septal and inferior basal defects that are partially reversible in the setting of significant breast attenuation and also radiotracer uptake near the inferior wall, most likely artifactual. No large ischemic territories.  This is a low risk study.  Nuclear stress EF: 45%, visually appears normal. Suggest echocardiogram.  {Select studies to display:26339}   History of Present Illness: Ms. Pistole was last seen via telemedicine in 9/22.  She has small vessel diabetic disease with mod prox to mid RCA, OM2 disease and a chronically occluded PDA and L-R collaterals. Her chest pain is managed medically.  She had fallen and fractured her foot.  She had undergone surgery just before her last visit.  She was still having chest pain.  Her Isosorbide and Metoprolol succinate were increased.  She returns for f/u.  *** ***  Past Medical History:  Diagnosis Date   ADHD (attention deficit hyperactivity disorder)    Allergic rhinitis    Anemia    hx of   Bipolar 1 disorder (HCC)    Bladder tumor    Cancer ([place])    bladder   Dizziness    Full dentures    GERD (gastroesophageal reflux disease)    Gross hematuria    Hyperlipidemia    Hypertension    Type 2 diabetes mellitus ([place])    Urgency of urination    dysuria, sui   Wears glasses    Current Medications: No  outpatient medications have been marked as taking for the 10/25/20 encounter (Appointment) with Richardson Dopp T, PA-C.   Current Facility-Administered Medications for the 10/25/20 encounter (Appointment) with Richardson Dopp T, PA-C  Medication   betamethasone acetate-betamethasone sodium phosphate (CELESTONE) injection 3 mg    Allergies:   Ibuprofen, Benazepril, Diphenhydramine, Metronidazole, Oxycodone, Adhesive [tape], Codeine, Loratadine, Nystatin-triamcinolone, and Penicillins   Social History   Tobacco Use   Smoking status: Former    Packs/day: 0.50    Years: 10.00    Pack years: 5.00    Types: Cigarettes    Quit date: 11/12/2019    Years since quitting: 0.9   Smokeless tobacco: Never  Vaping Use   Vaping Use: Never used  Substance Use Topics   Alcohol use: No   Drug use: No    Family Hx: The patient's family history includes Breast cancer in her maternal aunt; Cancer in her mother and another family member; Diabetes in her daughter; Hypertension in her mother; Lung cancer in her maternal uncle. There is no history of Colon cancer.  ROS   EKGs/Labs/Other Test Reviewed:    EKG:  EKG is *** ordered today.  The ekg ordered today demonstrates ***  Recent Labs: 05/29/2020: BUN 11; Creatinine, Ser 0.64; Hemoglobin 14.3; Platelets 383; Potassium 4.0; Sodium 133 06/05/2020: ALT 19   Recent Lipid Panel Lab Results  Component Value Date/Time   CHOL 246 (H) 06/05/2020 08:53 AM   TRIG 273 (H) 06/05/2020 08:53 AM   HDL 31 (L) 06/05/2020 08:53 AM   LDLCALC 163 (H) 06/05/2020 08:53 AM     Risk Assessment/Calculations:   {Does this patient have ATRIAL FIBRILLATION?:[id number]}      Physical Exam:    VS:  There were no vitals taken for this visit.    Wt Readings from Last 3 Encounters:  10/24/20 167 lb 3.2 oz (75.8 kg)  10/01/20 159 lb (72.1 kg)  08/22/20 156 lb (70.8 kg)    Physical Exam ***     ASSESSMENT & PLAN:   {Select for Dx:25819} 1. Coronary artery disease  involving native coronary artery of native heart with angina pectoris [facility]) Cardiac catheterization in 5/22 with diffuse small vessel diabetic disease.  She has moderate disease in the proximal to mid RCA and occluded PDA with left-to-right collaterals as well as moderate disease in OM 2.  She has been managed medically.  She continues to have chest discomfort in the morning as well as with exertion.  Overall, her angina seems to be fairly stable.  She describes CCS class II-III anginal symptoms.  Her blood pressure is out of control.  I have recommended that we increase her isosorbide to 60 mg daily and metoprolol succinate to 50 mg daily.  I will give her a prescription for as needed nitroglycerin.  I have asked her to monitor her blood pressure over the next couple weeks and send  me those readings through [employer].  If her blood pressure is not at target, I will increase her amlodipine to 10 mg daily.  Plan follow-up either virtually or in person with me or Dr. Johnsie Cancel in the next 3 to 4 weeks.  We discussed symptoms that would prompt emergent attention.   2. Familial hypercholesterolemia She has now been placed on evolocumab in addition to rosuvastatin, fenofibrate.  Follow-up with Dr. Debara Pickett as planned.   3. Essential hypertension Blood pressure is out of control.  Continue amlodipine 5 mg daily.  Increase isosorbide to 60 mg daily and metoprolol succinate 50 mg daily as outlined above.  I will adjust her medications further based upon follow-up readings in 1 week.   4. Uncontrolled type 2 diabetes mellitus with hyperglycemia ([place]) Continue follow-up with primary care.    {Are you ordering a CV Procedure (e.g. stress test, cath, DCCV, TEE, etc)?   Press F2        :[id number]}   Dispo:  No follow-ups on file.   Medication Adjustments/Labs and Tests Ordered: Current medicines are reviewed at length with the patient today.  Concerns regarding medicines are outlined above.  Tests Ordered: No orders  of the defined types were placed in this encounter.  Medication Changes: No orders of the defined types were placed in this encounter.  Signed, Richardson Dopp, PA-C  10/24/2020 10:46 PM    [facility] [address], [place], [place]  [address] Phone: [phone]; Fax: [phone]

## 2020-10-24 NOTE — Progress Notes (Signed)
10/24/2020, 2:12 PM                   Endocrinology follow-up note   Subjective:    Patient ID: Sandra [name], female    DOB: [birth year].  Sandra [name] is being seen in follow-up  for management of currently uncontrolled symptomatic type 2  diabetes requested by  Coolidge Breeze, FNP.   Past Medical History:  Diagnosis Date   ADHD (attention deficit hyperactivity disorder)    Allergic rhinitis    Anemia    hx of   Bipolar 1 disorder (HCC)    Bladder tumor    Cancer ([place])    bladder   Dizziness    Full dentures    GERD (gastroesophageal reflux disease)    Gross hematuria    Hyperlipidemia    Hypertension    Type 2 diabetes mellitus ([place])    Urgency of urination    dysuria, sui   Wears glasses     Past Surgical History:  Procedure Laterality Date   CYSTOSCOPY N/A 10/03/2015   Procedure: CYSTOSCOPY;  Surgeon: Irine Seal, MD;  Location: [facility];  Service: Urology;  Laterality: N/A;   CYSTOSCOPY WITH STENT PLACEMENT Right 10/31/2015   Procedure: CYSTOSCOPY WITH ATTEMPTED RIGHT URETERAL OPENING;  Surgeon: Irine Seal, MD;  Location: [facility];  Service: Urology;  Laterality: Right;   FOOT SURGERY Left    September 2022, broke 7 bones in left foot   INTERSTIM IMPLANT PLACEMENT N/A 03/14/2018   Procedure: Barrie Lyme IMPLANT FIRST STAGE;  Surgeon: Cleon Gustin, MD;  Location: [facility];  Service: Urology;  Laterality: N/A;   INTERSTIM IMPLANT PLACEMENT N/A 03/30/2018   Procedure: Barrie Lyme IMPLANT SECOND STAGE;  Surgeon: Cleon Gustin, MD;  Location: [facility];  Service: Urology;  Laterality: N/A;   LEFT HEART CATH AND CORONARY ANGIOGRAPHY N/A 05/31/2020   Procedure: LEFT HEART CATH AND CORONARY ANGIOGRAPHY;  Surgeon: Troy Sine, MD;  Location: [facility];  Service: Cardiovascular;  Laterality: N/A;   MULTIPLE TOOTH EXTRACTIONS  2015   OVARIAN  CYST REMOVAL Right 2004 approx   SHOULDER OPEN ROTATOR CUFF REPAIR Left 04/20/2019   Procedure: ROTATOR CUFF REPAIR SHOULDER OPEN;  Surgeon: Carole Civil, MD;  Location: [facility];  Service: Orthopedics;  Laterality: Left;   TONSILLECTOMY  12/30/2004   TOTAL ABDOMINAL HYSTERECTOMY W/ BILATERAL SALPINGOOPHORECTOMY  05/16/2004   TRANSURETHRAL RESECTION OF BLADDER TUMOR N/A 10/03/2015   Procedure: TRANSURETHRAL RESECTION OF BLADDER TUMOR (TURBT);  Surgeon: Irine Seal, MD;  Location: [facility];  Service: Urology;  Laterality: N/A;   TRANSURETHRAL RESECTION OF BLADDER TUMOR N/A 10/31/2015   Procedure: RE-STAGINGG TRANSURETHRAL RESECTION OF BLADDER TUMOR (TURBT);  Surgeon: Irine Seal, MD;  Location: [facility];  Service: Urology;  Laterality: N/A;   TRANSURETHRAL RESECTION OF BLADDER TUMOR N/A 08/17/2016   Procedure: TRANSURETHRAL RESECTION OF BLADDER TUMOR (TURBT);  Surgeon: Cleon Gustin, MD;  Location: [facility];  Service: Urology;  Laterality: N/A;    Social History   Socioeconomic History   Marital status: Married    Spouse name: Not on file  Number of children: Not on file   Years of education: Not on file   Highest education level: Not on file  Occupational History   Not on file  Tobacco Use   Smoking status: Former    Packs/day: 0.50    Years: 10.00    Pack years: 5.00    Types: Cigarettes    Quit date: 11/12/2019    Years since quitting: 0.9   Smokeless tobacco: Never  Vaping Use   Vaping Use: Never used  Substance and Sexual Activity   Alcohol use: No   Drug use: No   Sexual activity: Not Currently    Birth control/protection: Surgical    Comment: hyst  Other Topics Concern   Not on file  Social History Narrative   Not on file   Social Determinants of Health   Financial Resource Strain: Not on file  Food Insecurity: Not on file  Transportation Needs: Not on file  Physical Activity: Not on file  Stress: Not on file   Social Connections: Not on file    Family History  Problem Relation Age of Onset   Hypertension Mother    Cancer Mother    Breast cancer Maternal Aunt    Lung cancer Maternal Uncle    Cancer Other    Diabetes Daughter    Colon cancer Neg Hx     Outpatient Encounter Medications as of 10/24/2020  Medication Sig   Accu-Chek Softclix Lancets lancets Use as instructed to monitor glucose 4 times daily   Blood Glucose Monitoring Suppl (ACCU-CHEK GUIDE) w/Device KIT 1 Piece by Does not apply route as directed.   buPROPion (WELLBUTRIN XL) 300 MG 24 hr tablet Take 300 mg by mouth every morning.   cloNIDine HCl (KAPVAY) 0.1 MG TB12 ER tablet Take 0.1 mg by mouth every evening.   Continuous Blood Gluc Sensor (DEXCOM G6 SENSOR) MISC Apply new sensor every 10 days as directed   Continuous Blood Gluc Transmit (DEXCOM G6 TRANSMITTER) MISC 1 Device by Does not apply route every 3 (three) months.   diclofenac (VOLTAREN) 75 MG EC tablet Take 75 mg by mouth 2 (two) times daily.   Evolocumab (REPATHA SURECLICK) 222 MG/ML SOAJ Inject 1 Dose into the skin every 14 (fourteen) days.   fenofibrate (TRICOR) 145 MG tablet Take 145 mg by mouth daily.   FLUoxetine (PROZAC) 20 MG capsule Take 20 mg by mouth daily.   FLUoxetine (PROZAC) 40 MG capsule Take 40 mg by mouth daily.   gabapentin (NEURONTIN) 300 MG capsule TAKE 1 CAPSULE(300 MG) BY MOUTH THREE TIMES DAILY   glucose blood (ACCU-CHEK GUIDE) test strip Use as instructed to monitor glucose 4 times daily, before meals and before bed.   HYDROcodone-acetaminophen (NORCO) 10-325 MG tablet TAKE 1 TABLET BY MOUTH EVERY 6 HOURS AS NEEDED FOR UP TO 5 DAYS.   hydrocortisone (ANUSOL-HC) 2.5 % rectal cream Place 1 application rectally 2 (two) times daily.   Insulin Pen Needle (PEN NEEDLES) 33G X 4 MM MISC 1 each by Does not apply route in the morning, at noon, in the evening, and at bedtime.   isosorbide mononitrate (IMDUR) 60 MG 24 hr tablet Take 1 tablet (60 mg  total) by mouth daily.   meloxicam (MOBIC) 7.5 MG tablet Take 7.5 mg by mouth daily as needed for pain.   metoprolol succinate (TOPROL-XL) 50 MG 24 hr tablet Take 1 tablet (50 mg total) by mouth daily. Take with or immediately following a meal.   montelukast (SINGULAIR) 10 MG  tablet Take 10 mg by mouth at bedtime.   nitroGLYCERIN (NITROSTAT) 0.4 MG SL tablet Place 1 tablet (0.4 mg total) under the tongue every 5 (five) minutes as needed for chest pain.   pantoprazole (PROTONIX) 40 MG tablet Take 1 tablet (40 mg total) by mouth 2 (two) times daily before a meal.   rosuvastatin (CRESTOR) 40 MG tablet Take 40 mg by mouth daily.   VYVANSE 40 MG capsule Take 40 mg by mouth every morning.   [DISCONTINUED] glipiZIDE (GLUCOTROL XL) 5 MG 24 hr tablet Take 1 tablet (5 mg total) by mouth daily with breakfast.   [DISCONTINUED] insulin aspart (NOVOLOG) 100 UNIT/ML FlexPen Inject 10-16 Units into the skin 3 (three) times daily with meals.   [DISCONTINUED] insulin glargine (LANTUS) 100 UNIT/ML Solostar Pen Inject 40 Units into the skin at bedtime.   [DISCONTINUED] metFORMIN (GLUCOPHAGE) 1000 MG tablet Take 1 tablet (1,000 mg total) by mouth 2 (two) times daily with a meal.   amLODipine (NORVASC) 5 MG tablet Take 1 tablet (5 mg total) by mouth daily.   glipiZIDE (GLUCOTROL XL) 5 MG 24 hr tablet Take 1 tablet (5 mg total) by mouth daily with breakfast.   insulin aspart (NOVOLOG) 100 UNIT/ML FlexPen Inject 10-16 Units into the skin 3 (three) times daily with meals.   insulin glargine (LANTUS) 100 UNIT/ML Solostar Pen Inject 30 Units into the skin at bedtime.   metFORMIN (GLUCOPHAGE) 1000 MG tablet Take 1 tablet (1,000 mg total) by mouth 2 (two) times daily with a meal.   [DISCONTINUED] doxycycline (VIBRA-TABS) 100 MG tablet Take 1 tablet (100 mg total) by mouth 2 (two) times daily. (Patient not taking: Reported on 10/24/2020)   Facility-Administered Encounter Medications as of 10/24/2020  Medication    betamethasone acetate-betamethasone sodium phosphate (CELESTONE) injection 3 mg    ALLERGIES: Allergies  Allergen Reactions   Ibuprofen Hives    itching itching itching   Benazepril Hives   Diphenhydramine Hives   Metronidazole Nausea And Vomiting   Oxycodone     Pt has tolerated hydromorphone in the past and takes Norco at home.   Adhesive [Tape] Rash   Codeine Nausea And Vomiting and Rash   Loratadine Rash   Nystatin-Triamcinolone Rash   Penicillins Rash    Has patient had a PCN reaction causing immediate rash, facial/tongue/throat swelling, SOB or lightheadedness with hypotension: No Has patient had a PCN reaction causing severe rash involving mucus membranes or skin necrosis: Yes Has patient had a PCN reaction that required hospitalization: No Has patient had a PCN reaction occurring within the last 10 years: no  If all of the above answers are "NO", then may proceed with Cephalosporin use.    VACCINATION STATUS: Immunization History  Administered Date(s) Administered   Influenza,inj,Quad PF,6+ Mos 11/01/2015    Diabetes She presents for her follow-up diabetic visit. She has type 2 diabetes mellitus. Onset time: She was diagnosed at approximate age of 7 years with A1c of greater than 15% Her disease course has been improving. Hypoglycemia symptoms include nervousness/anxiousness, sweats and tremors. Pertinent negatives for hypoglycemia include no confusion, headaches, pallor or seizures. Associated symptoms include foot paresthesias. Pertinent negatives for diabetes include no blurred vision, no chest pain, no fatigue, no polydipsia, no polyphagia and no polyuria. There are no hypoglycemic complications. Symptoms are stable. Diabetic complications include peripheral neuropathy. Risk factors for coronary artery disease include dyslipidemia, diabetes mellitus, hypertension, sedentary lifestyle and tobacco exposure. Current diabetic treatment includes intensive insulin program  and oral agent (dual  therapy). She is compliant with treatment most of the time. Her weight is increasing steadily. She is following a generally unhealthy diet. When asked about meal planning, she reported none. She has not had a previous visit with a dietitian. She rarely participates in exercise. Her home blood glucose trend is decreasing steadily. Her breakfast blood glucose range is generally 90-110 mg/dl. Her lunch blood glucose range is generally 110-130 mg/dl. Her dinner blood glucose range is generally 110-130 mg/dl. Her bedtime blood glucose range is generally 110-130 mg/dl. (She presents today with her CGM, no logs showing tight fasting and at goal postprandial glycemic profile.  Her previsit A1c was 8.6% on 09/18/20 improving from last visit of 11.2%.  She underwent surgery on her left foot after breaking it during a fall.  She does report quite frequent hypoglycemia. ) An ACE inhibitor/angiotensin II receptor blocker is not being taken. She does not see a podiatrist.Eye exam is current.  Hyperlipidemia This is a chronic problem. The current episode started more than 1 year ago. The problem is uncontrolled. Recent lipid tests were reviewed and are high. Exacerbating diseases include diabetes. Factors aggravating her hyperlipidemia include beta blockers and fatty foods. Pertinent negatives include no chest pain, myalgias or shortness of breath. Current antihyperlipidemic treatment includes fibric acid derivatives and statins. The current treatment provides mild improvement of lipids. Compliance problems include adherence to diet and adherence to exercise.  Risk factors for coronary artery disease include diabetes mellitus, dyslipidemia, a sedentary lifestyle, family history and hypertension.  Hypertension This is a chronic problem. The current episode started more than 1 year ago. The problem is uncontrolled. Associated symptoms include sweats. Pertinent negatives include no blurred vision, chest pain,  headaches, palpitations or shortness of breath. There are no associated agents to hypertension. Risk factors for coronary artery disease include dyslipidemia, diabetes mellitus and sedentary lifestyle. Past treatments include calcium channel blockers and beta blockers. The current treatment provides mild improvement. Compliance problems include diet and exercise.  Hypertensive end-organ damage includes CAD/MI.   Review of systems  Constitutional: + Minimally fluctuating body weight,  current Body mass index is 32.65 kg/m. , no fatigue, no subjective hyperthermia, no subjective hypothermia Eyes: no blurry vision, no xerophthalmia ENT: no sore throat, no nodules palpated in throat, no dysphagia/odynophagia, no hoarseness Cardiovascular: no chest pain, no shortness of breath, no palpitations, no leg swelling Respiratory: no cough, no shortness of breath Gastrointestinal: no nausea/vomiting/diarrhea Musculoskeletal: no muscle/joint aches, left foot in compression wrap- had surgery since last visit Skin: no rashes, no hyperemia Neurological: no tremors, + numbness/tingling to BLE, no dizziness Psychiatric: no depression, no anxiety   Objective:    BP (!) 154/89   Pulse 73   Ht 5' (1.524 m)   Wt 167 lb 3.2 oz (75.8 kg)   BMI 32.65 kg/m   Wt Readings from Last 3 Encounters:  10/24/20 167 lb 3.2 oz (75.8 kg)  10/01/20 159 lb (72.1 kg)  08/22/20 156 lb (70.8 kg)    BP Readings from Last 3 Encounters:  10/24/20 (!) 154/89  10/01/20 (!) 159/96  08/22/20 119/79     Physical Exam- Limited  Constitutional:  Body mass index is 32.65 kg/m. , not in acute distress, normal state of mind Eyes:  EOMI, no exophthalmos Neck: Supple Cardiovascular: RRR, no murmurs, rubs, or gallops, no edema Respiratory: Adequate breathing efforts, no crackles, rales, rhonchi, or wheezing Musculoskeletal: no gross deformities, strength intact in all four extremities, no gross restriction of joint  movements Skin:  no rashes, no hyperemia Neurological: no tremor with outstretched hands   Diabetic Foot Exam - Simple   Simple Foot Form Diabetic Foot exam was performed with the following findings: Yes 10/24/2020  2:12 PM  Visual Inspection See comments: Yes Sensation Testing See comments: Yes Pulse Check Posterior Tibialis and Dorsalis pulse intact bilaterally: Yes Comments Left foot in compression wrap from recent surgery; right foot with calluses and no sensation to monofilament tool    CMP ( most recent) CMP     Component Value Date/Time   NA 133 (L) 05/29/2020 1218   K 4.0 05/29/2020 1218   CL 99 05/29/2020 1218   CO2 25 05/29/2020 1218   GLUCOSE 374 (H) 05/29/2020 1218   BUN 11 05/29/2020 1218   BUN 9 02/24/2018 0000   CREATININE 0.64 05/29/2020 1218   CALCIUM 9.4 05/29/2020 1218   PROT 7.0 06/05/2020 0853   ALBUMIN 4.2 06/05/2020 0853   AST 15 06/05/2020 0853   ALT 19 06/05/2020 0853   ALKPHOS 137 (H) 06/05/2020 0853   BILITOT 0.2 06/05/2020 0853   GFRNONAA >60 05/29/2020 1218   GFRAA >60 04/17/2019 1427     Diabetic Labs (most recent): Lab Results  Component Value Date   HGBA1C 8.6 (H) 09/18/2020   HGBA1C 11.2 (A) 07/10/2020   HGBA1C 14.5 (H) 03/29/2020      Assessment & Plan:   1) Uncontrolled type 2 diabetes mellitus with hyperglycemia ([place])  - Sandra [name] has currently uncontrolled symptomatic type 2 DM since  45 years of age.  She presents today with her CGM, no logs showing tight fasting and at goal postprandial glycemic profile.  Her previsit A1c was 8.6% on 09/18/20 improving from last visit of 11.2%.  She underwent surgery on her left foot after breaking it during a fall.  She does report quite frequent hypoglycemia.  - I had a long discussion with her about the progressive nature of diabetes and the pathology behind its complications. -her diabetes is complicated by chronic smoking and she remains at a high risk for more acute and  chronic complications which include CAD, CVA, CKD, retinopathy, and neuropathy. These are all discussed in detail with her.  - Nutritional counseling repeated at each appointment due to patients tendency to fall back in to old habits.  - The patient admits there is a room for improvement in their diet and drink choices. -  Suggestion is made for the patient to avoid simple carbohydrates from their diet including Cakes, Sweet Desserts / Pastries, Ice Cream, Soda (diet and regular), Sweet Tea, Candies, Chips, Cookies, Sweet Pastries, Store Bought Juices, Alcohol in Excess of 1-2 drinks a day, Artificial Sweeteners, Coffee Creamer, and "Sugar-free" Products. This will help patient to have stable blood glucose profile and potentially avoid unintended weight gain.   - I encouraged the patient to switch to unprocessed or minimally processed complex starch and increased protein intake (animal or plant source), fruits, and vegetables.   - Patient is advised to stick to a routine mealtimes to eat 3 meals a day and avoid unnecessary snacks (to snack only to correct hypoglycemia).  - she will be scheduled with Sandra [name], RDN, CDE for diabetes education.  - I have approached her with the following individualized plan to manage  her diabetes and patient agrees:   -Based on her hypoglycemia, she is advised to lower her dose of Lantus to 30 units SQ nightly and continue her Novolog 10-16 units TID with meals if glucose is  above 90 and she is eating (Specific instructions on how to titrate insulin dosage based on glucose readings given to patient in writing).  She can continue her Metformin 1000 mg po twice daily with meals and Glipizide 5 mg po daily with breakfast (says she restarted it between visits when it was discontinued previously by her cardiologist).  -She is encouraged to continue monitoring blood glucose 4 times daily (using her CGM), before meals and at bedtime, and to call the clinic if she has  readings less than 70 or greater than 300 for 3 readings in a row.   - she is not a suitable candidate for incretin therapy, she will need fasting lipid profile on subsequent visits.  - Specific targets for  A1c;  LDL, HDL, Triglycerides, were discussed with the patient.  2) Blood Pressure /Hypertension: Her blood pressure is not controlled to target but is improving. She is allergic to ACE inhibitors. She has a history of being a chronic heavy smoker, quit in 2021.  She is advised to continue her current medications including Metoprolol 50 mg po daily and Norvasc 5 mg po daily.   3) Lipids/Hyperlipidemia:  Her most recent lipid panel from 06/05/20 shows uncontrolled LDL of 163 and elevated triglycerides of 273.  She is advised to continue her Crestor 40 mg po daily at bedtime, Repatha 140 mg po every 14 days, and Tricor 145 mg po daily.  Side effects and precautions discussed with her.    4)  Weight/Diet:  Her Body mass index is 32.65 kg/m.-clearly complicating her diabetes care.  She is a candidate for modest weight loss.  I discussed with her the fact that loss of 5 - 10% of her  current body weight will have the most impact on her diabetes management.  Exercise, and detailed carbohydrates information provided  -  detailed on discharge instructions.  5) Chronic Care/Health Maintenance: -she is not on ACEI/ARB and is on Statin medications and is encouraged to initiate and continue to follow up with Ophthalmology, Dentist,  Podiatrist at least yearly or according to recommendations, and advised to stay away from smoking. I have recommended yearly flu vaccine and pneumonia vaccine at least every 5 years; moderate intensity exercise for up to 150 minutes weekly; and  sleep for at least 7 hours a day.  - she is advised to maintain close follow up with Coolidge Breeze, FNP for primary care needs, as well as her other providers for optimal and coordinated care.      I spent 40 minutes in the care  of the patient today including review of labs from [place], Lipids, Thyroid Function, Hematology (current and previous including abstractions from other facilities); face-to-face time discussing  her blood glucose readings/logs, discussing hypoglycemia and hyperglycemia episodes and symptoms, medications doses, her options of short and long term treatment based on the latest standards of care / guidelines;  discussion about incorporating lifestyle medicine;  and documenting the encounter.    Please refer to Patient Instructions for Blood Glucose Monitoring and Insulin/Medications Dosing Guide"  in media tab for additional information. Please  also refer to " Patient Self Inventory" in the Media  tab for reviewed elements of pertinent patient history.  Sandra [name] participated in the discussions, expressed understanding, and voiced agreement with the above plans.  All questions were answered to her satisfaction. she is encouraged to contact clinic should she have any questions or concerns prior to her return visit.   Follow up plan: -  Return in about 4 months (around 02/24/2021) for Diabetes F/U with A1c in office, Previsit labs, Bring meter and logs.   Rayetta Pigg, [facility] [address] [place], [place] [address] Phone: [phone] Fax: [phone]  10/24/2020, 2:12 PM

## 2020-10-25 ENCOUNTER — Ambulatory Visit: Payer: Medicaid Other | Admitting: Physician Assistant

## 2020-10-25 DIAGNOSIS — E1165 Type 2 diabetes mellitus with hyperglycemia: Secondary | ICD-10-CM

## 2020-10-25 DIAGNOSIS — E785 Hyperlipidemia, unspecified: Secondary | ICD-10-CM

## 2020-10-25 DIAGNOSIS — E7801 Familial hypercholesterolemia: Secondary | ICD-10-CM

## 2020-10-25 DIAGNOSIS — I10 Essential (primary) hypertension: Secondary | ICD-10-CM

## 2020-10-25 DIAGNOSIS — I25119 Atherosclerotic heart disease of native coronary artery with unspecified angina pectoris: Secondary | ICD-10-CM

## 2020-10-28 ENCOUNTER — Ambulatory Visit (INDEPENDENT_AMBULATORY_CARE_PROVIDER_SITE_OTHER): Payer: Medicaid Other

## 2020-10-28 ENCOUNTER — Other Ambulatory Visit: Payer: Self-pay

## 2020-10-28 ENCOUNTER — Ambulatory Visit (INDEPENDENT_AMBULATORY_CARE_PROVIDER_SITE_OTHER): Payer: Medicaid Other | Admitting: Podiatry

## 2020-10-28 DIAGNOSIS — Z9889 Other specified postprocedural states: Secondary | ICD-10-CM | POA: Diagnosis not present

## 2020-10-28 DIAGNOSIS — S92352D Displaced fracture of fifth metatarsal bone, left foot, subsequent encounter for fracture with routine healing: Secondary | ICD-10-CM | POA: Diagnosis not present

## 2020-10-28 MED ORDER — HYDROCODONE-ACETAMINOPHEN 10-325 MG PO TABS: ORAL_TABLET | ORAL | 0 refills | Status: DC

## 2020-10-28 NOTE — Progress Notes (Signed)
   Subjective:  Patient presents today status post metatarsal head resection 3, 4, 5 left foot. DOS: 09/26/2020.  Patient states that she continues to have some pain and tenderness associated to the toes.  No new complaints at this time  Past Medical History:  Diagnosis Date   ADHD (attention deficit hyperactivity disorder)    Allergic rhinitis    Anemia    hx of   Bipolar 1 disorder (HCC)    Bladder tumor    Cancer ([place])    bladder   Dizziness    Full dentures    GERD (gastroesophageal reflux disease)    Gross hematuria    Hyperlipidemia    Hypertension    Type 2 diabetes mellitus ([place])    Urgency of urination    dysuria, sui   Wears glasses       Objective/Physical Exam Neurovascular status intact.  Skin incisions appear to be well coapted and healed. No sign of infectious process noted. No dehiscence. No active bleeding noted.  Minimal edema noted to the surgical extremity.  There is some associated tenderness with palpation and range of motion to the digits  Radiographic Exam 10/14/2020 LT foot:  Percutaneous fixation pins and osteotomies sites appear to be stable with routine healing.  Radiolucency along the dorsum of the foot appears to be resolved  Assessment: 1. s/p metatarsal head resections 3, 4, 5 left. DOS: 09/26/2020   Plan of Care:  1. Patient was evaluated.  Staples removed today 2.  Discontinue postsurgical shoe.  Recommend good supportive shoes and sneakers.  Patient states that she mostly wears sandals 3.  Order placed today for physical therapy 2 times per week at [facility] 4.  Recommend daily massage and range of motion exercises to the toes  5.  Refill prescription for Vicodin 5/3 2 5  mg  6.  Return to clinic 6 weeks   Edrick Kins, DPM [facility]  Dr. Edrick Kins, DPM    [address], [place] [address]                Office [phone]  Fax [phone]

## 2020-10-30 ENCOUNTER — Other Ambulatory Visit: Payer: Self-pay | Admitting: Podiatry

## 2020-10-31 ENCOUNTER — Telehealth: Payer: Self-pay

## 2020-10-31 NOTE — Telephone Encounter (Signed)
I called LM for patient.  I don't see another msg for her, not sure what is needed.

## 2020-10-31 NOTE — Telephone Encounter (Signed)
Patient returned your call. [phone]

## 2020-11-04 ENCOUNTER — Ambulatory Visit: Payer: Medicaid Other | Admitting: Gastroenterology

## 2020-11-05 ENCOUNTER — Other Ambulatory Visit: Payer: Self-pay | Admitting: Podiatry

## 2020-11-05 ENCOUNTER — Telehealth: Payer: Self-pay | Admitting: Podiatry

## 2020-11-05 NOTE — Telephone Encounter (Signed)
Estill Bamberg, could we check with [facility].  I am pretty sure I filled out a form.  If not we can refill one out.  Thanks, Dr. Amalia Hailey

## 2020-11-05 NOTE — Telephone Encounter (Signed)
Patient called office to let us know she has not heard from [facility] regarding setting up PT. Thanks

## 2020-11-06 ENCOUNTER — Other Ambulatory Visit: Payer: Self-pay

## 2020-11-06 ENCOUNTER — Other Ambulatory Visit: Payer: Self-pay | Admitting: Podiatry

## 2020-11-06 DIAGNOSIS — S92352A Displaced fracture of fifth metatarsal bone, left foot, initial encounter for closed fracture: Secondary | ICD-10-CM

## 2020-11-06 DIAGNOSIS — Z9889 Other specified postprocedural states: Secondary | ICD-10-CM

## 2020-11-08 ENCOUNTER — Encounter: Payer: Self-pay | Admitting: Podiatry

## 2020-11-11 ENCOUNTER — Other Ambulatory Visit: Payer: Self-pay | Admitting: Podiatry

## 2020-11-11 ENCOUNTER — Encounter: Payer: Medicaid Other | Admitting: Podiatry

## 2020-11-11 DIAGNOSIS — Z9889 Other specified postprocedural states: Secondary | ICD-10-CM

## 2020-11-11 DIAGNOSIS — S92352A Displaced fracture of fifth metatarsal bone, left foot, initial encounter for closed fracture: Secondary | ICD-10-CM

## 2020-11-11 MED ORDER — HYDROCODONE-ACETAMINOPHEN 10-325 MG PO TABS: ORAL_TABLET | ORAL | 0 refills | Status: DC

## 2020-11-11 NOTE — Telephone Encounter (Signed)
Please advise 

## 2020-11-11 NOTE — Progress Notes (Signed)
PRN postop 

## 2020-11-11 NOTE — Telephone Encounter (Signed)
Just placed a new order for PT within [facility]. Please follow up and reach out to patient. Thanks, Dr. Amalia Hailey

## 2020-11-12 ENCOUNTER — Ambulatory Visit (HOSPITAL_COMMUNITY): Payer: Medicaid Other | Attending: Podiatry

## 2020-11-12 ENCOUNTER — Other Ambulatory Visit: Payer: Self-pay

## 2020-11-12 DIAGNOSIS — R2681 Unsteadiness on feet: Secondary | ICD-10-CM | POA: Insufficient documentation

## 2020-11-12 DIAGNOSIS — S92352A Displaced fracture of fifth metatarsal bone, left foot, initial encounter for closed fracture: Secondary | ICD-10-CM | POA: Insufficient documentation

## 2020-11-12 DIAGNOSIS — M79672 Pain in left foot: Secondary | ICD-10-CM | POA: Diagnosis not present

## 2020-11-12 DIAGNOSIS — R262 Difficulty in walking, not elsewhere classified: Secondary | ICD-10-CM | POA: Insufficient documentation

## 2020-11-12 DIAGNOSIS — R2689 Other abnormalities of gait and mobility: Secondary | ICD-10-CM | POA: Insufficient documentation

## 2020-11-12 DIAGNOSIS — Z9889 Other specified postprocedural states: Secondary | ICD-10-CM | POA: Diagnosis not present

## 2020-11-12 NOTE — Therapy (Signed)
Sandra [name], [place], [address] Phone: [phone]   Fax:  [phone]  Physical Therapy Evaluation  Patient Details  Name: Sandra [name] MRN: [id number] Date of Birth: February 20, 1975 (age 45) Referring Provider (PT): Edrick Kins, [place]   Encounter Date: 11/12/2020   PT End of Session - 11/12/20 0951     Visit Number 1    Number of Visits 6    Date for PT Re-Evaluation 12/24/20    Authorization Type Medicaid [place] - Visit Number 1    Authorization - Number of Visits 3    Progress Note Due on Visit 3    PT Start Time [phone]    PT Stop Time 1030    PT Time Calculation (min) 43 min    Activity Tolerance Patient tolerated treatment well;Patient limited by pain    Behavior During Therapy [facility] for tasks assessed/performed             Past Medical History:  Diagnosis Date   ADHD (attention deficit hyperactivity disorder)    Allergic rhinitis    Anemia    hx of   Bipolar 1 disorder ([place])    Bladder tumor    Cancer ([place])    bladder   Dizziness    Full dentures    GERD (gastroesophageal reflux disease)    Gross hematuria    Hyperlipidemia    Hypertension    Type 2 diabetes mellitus ([place])    Urgency of urination    dysuria, sui   Wears glasses     Past Surgical History:  Procedure Laterality Date   CYSTOSCOPY N/A 10/03/2015   Procedure: CYSTOSCOPY;  Surgeon: Irine Seal, MD;  Location: [place];  Service: Urology;  Laterality: N/A;   CYSTOSCOPY WITH STENT PLACEMENT Right 10/31/2015   Procedure: CYSTOSCOPY WITH ATTEMPTED RIGHT URETERAL OPENING;  Surgeon: Irine Seal, MD;  Location[facility];  Service: Urology;  Laterality: Right;   FOOT SURGERY Left    September 2022, broke 7 bones in left foot   INTERSTIM IMPLANT PLACEMENT N/A 03/14/2018   Procedure: Barrie Lyme IMPLANT FIRST STAGE;  Surgeon: Cleon Gustin, MD;  [facility];  Service: Urology;   Laterality: N/A;   INTERSTIM IMPLANT PLACEMENT N/A 03/30/2018   Procedure: Barrie Lyme IMPLANT SECOND STAGE;  Surgeon: Cleon Gustin, MD;  Location: [facility];  Service: Urology;  Laterality: N/A;   LEFT HEART CATH AND CORONARY ANGIOGRAPHY N/A 05/31/2020   Procedure: LEFT HEART CATH AND CORONARY ANGIOGRAPHY;  Surgeon: Troy Sine, MD;  Location: [place] [facility];  Service: Cardiovascular;  Laterality: N/A;   MULTIPLE TOOTH EXTRACTIONS  2015   OVARIAN CYST REMOVAL Right 2004 approx   SHOULDER OPEN ROTATOR CUFF REPAIR Left 04/20/2019   Procedure: ROTATOR CUFF REPAIR SHOULDER OPEN;  Surgeon: Carole Civil, MD;  Location[facility];  Service: Orthopedics;  Laterality: Left;   TONSILLECTOMY  12/30/2004   TOTAL ABDOMINAL HYSTERECTOMY W/ BILATERAL SALPINGOOPHORECTOMY  05/16/2004   TRANSURETHRAL RESECTION OF BLADDER TUMOR N/A 10/03/2015   Procedure: TRANSURETHRAL RESECTION OF BLADDER TUMOR (TURBT);  Surgeon: Irine Seal, MD;  Location: [facility];  Service: Urology;  Laterality: N/A;   TRANSURETHRAL RESECTION OF BLADDER TUMOR N/A 10/31/2015   Procedure: RE-STAGINGG TRANSURETHRAL RESECTION OF BLADDER TUMOR (TURBT);  Surgeon: Irine Seal, MD;  Location: [facility];  Service: Urology;  Laterality: N/A;   TRANSURETHRAL RESECTION OF BLADDER TUMOR N/A 08/17/2016  Procedure: TRANSURETHRAL RESECTION OF BLADDER TUMOR (TURBT);  Surgeon: Cleon Gustin, MD;  [facility];  Service: Urology;  Laterality: N/A;    There were no vitals filed for this visit.    Subjective Assessment - 11/12/20 0953     Subjective Left foot surgery to correct fracture and deformity with resulting status post metatarsal head resection 3, 4, 5 left foot. DOS: 09/26/2020    Pertinent History DM    Currently in Pain? Yes    Pain Score 8     Pain Location Toe (Comment which one)    Pain Orientation Left    Pain Descriptors / Indicators Burning;Aching    Pain Type Acute pain;Chronic  pain    Pain Onset More than a month ago    Pain Frequency Intermittent    Aggravating Factors  walking    Pain Relieving Factors rest                [facility] PT Assessment - 11/12/20 0001       Assessment   Medical Diagnosis Status post left foot surgery    Referring Provider (PT) Edrick Kins, DPM    Onset Date/Surgical Date 09/26/20      Balance Screen   Has the patient fallen in the past 6 months Yes    How many times? 1    Has the patient had a decrease in activity level because of a fear of falling?  No    Is the patient reluctant to leave their home because of a fear of falling?  No      Home Ecologist residence    Living Arrangements Children      Prior Function   Level of Independence Independent      Observation/Other Assessments-Edema    Edema Figure 8      Figure 8 Edema   Figure 8 - Right  47cm    Figure 8 - Left  49cm   more swelling in forefoot     Functional Tests   Functional tests Single leg stance      Single Leg Stance   Comments 15 sec RLE, 3 sec LLE      ROM / Strength   AROM / PROM / Strength AROM;Strength      AROM   AROM Assessment Site Ankle    Right/Left Ankle Left    Left Ankle Dorsiflexion 15      Strength   Overall Strength Comments not much volitional control left toes 3-5    Strength Assessment Site Ankle    Right/Left Ankle Left    Left Ankle Dorsiflexion 3/5    Left Ankle Plantar Flexion --   DNT due to pain/stress to site     Palpation   Palpation comment left toes/surgical site tender to palpation and PROM      Ambulation/Gait   Ambulation/Gait Yes    Ambulation/Gait Assistance 7: Independent    Ambulation Distance (Feet) 226 Feet    Assistive device None    Gait Pattern Antalgic    Ambulation Surface Level;Indoor                        Objective measurements completed on examination: See above findings.                PT Education - 11/12/20 1023      Education Details education on left foot desensitization techniques, passive toe abduction/toe spacers  Person(s) Educated Patient    Methods Explanation;Handout    Comprehension Verbalized understanding              PT Short Term Goals - 11/12/20 1027       PT SHORT TERM GOAL #1   Title Patient will be independent with HEP in order to improve functional outcomes.    Time 3    Period Weeks    Status New    Target Date 12/03/20      PT SHORT TERM GOAL #2   Title Patient will report at least 25% improvement in symptoms for improved quality of life.    Baseline 8/10 left foot pain with walking    Time 3    Period Weeks    Status New    Target Date 12/03/20      PT SHORT TERM GOAL #3   Title Demo improved gait speed/tolerance as evidenced by 275 ft 2MWT    Baseline 226 ft with antalgic pattern    Time 3    Period Weeks    Status New    Target Date 12/03/20               PT Long Term Goals - 11/12/20 1036       PT LONG TERM GOAL #1   Title Demo improved balance and WBing tolerance as evidenced by 15 sec single leg stance    Baseline 10 sec RLE, 3 sec LLE    Time 6    Period Weeks    Status New    Target Date 12/24/20                    Plan - 11/12/20 1024     Clinical Impression Statement 45 yo lady presenting with continued left foot pain, weakness, sensory disturbance with recent hx of left foot/toe fractures and required ORIF to fixate.  Demonstrates reduced activity/gait tolerance and resultant gait deviation.  Due to pain and deficits demonstrates limited ambulation capabilities and dynamic/static balance indicating need for PT services to improve left foot function and normalize gait pattern    Personal Factors and Comorbidities Comorbidity 1;Time since onset of injury/illness/exacerbation    Comorbidities DM    Examination-Activity Limitations Locomotion Level;Transfers;Stairs;Stand;Carry;Reach Overhead    Examination-Participation  Restrictions Cleaning;Community Activity;Yard Work;Shop;Meal Prep    Stability/Clinical Decision Making Stable/Uncomplicated    Clinical Decision Making Low    Rehab Potential Good    PT Frequency 1x / week    PT Duration 6 weeks    PT Treatment/Interventions ADLs/Self Care Home Management;Electrical Stimulation;DME Instruction;Moist Heat;Gait training;Functional mobility training;Therapeutic activities;Therapeutic exercise;Balance training;Neuromuscular re-education;Patient/family education;Passive range of motion;Energy conservation;Splinting;Taping;Spinal Manipulations;Joint Manipulations    PT Next Visit Plan left toes, ROM, strength, edema massage    PT Home Exercise Plan towel toes, desensitization, toe spread    Consulted and Agree with Plan of Care Patient             Patient will benefit from skilled therapeutic intervention in order to improve the following deficits and impairments:  Abnormal gait, Decreased activity tolerance, Decreased balance, Decreased mobility, Decreased range of motion, Decreased strength, Increased edema, Difficulty walking, Pain  Visit Diagnosis: Pain in left foot  Difficulty in walking, not elsewhere classified  Other abnormalities of gait and mobility  Unsteadiness on feet     Problem List Patient Active Problem List   Diagnosis Date Noted   Rectal bleeding 05/17/2020   Gastroesophageal reflux disease 05/17/2020   Dysphagia 05/17/2020  Essential hypertension, benign 05/15/2019   S/P left rotator cuff repair 04/20/19  04/27/2019   Complete tear of left rotator cuff    Uncontrolled type 2 diabetes mellitus with hyperglycemia ([place]) 10/10/2018   Mixed hyperlipidemia 10/10/2018   Sacroiliac joint pain 02/23/2017   Chronic low back pain 02/09/2017   Somatic dysfunction of left sacroiliac joint 02/09/2017   GAD (generalized anxiety disorder) 12/17/2016   MDD (major depressive disorder), recurrent episode, moderate ([place]) 12/17/2016    Attention deficit hyperactivity disorder (ADHD) 12/17/2016   Postoperative anemia due to acute blood loss 11/04/2015   Bladder cancer ([place]) 10/31/2015   CARPAL TUNNEL SYNDROME 10/02/2009    Toniann Fail, PT 11/12/2020, 10:40 AM  [place] [address] [place], [place], [address] Phone: [phone]   Fax:  [phone]  Name: CORDA SHUTT MRN: [id number] Date of Birth: [birth year]

## 2020-11-15 ENCOUNTER — Other Ambulatory Visit: Payer: Self-pay | Admitting: Podiatry

## 2020-11-15 ENCOUNTER — Encounter: Payer: Self-pay | Admitting: Podiatry

## 2020-11-15 NOTE — Telephone Encounter (Signed)
Please advise,sent 3 times

## 2020-11-17 ENCOUNTER — Other Ambulatory Visit: Payer: Self-pay | Admitting: Podiatry

## 2020-11-18 MED ORDER — HYDROCODONE-ACETAMINOPHEN 10-325 MG PO TABS: ORAL_TABLET | ORAL | 0 refills | Status: DC

## 2020-11-19 ENCOUNTER — Ambulatory Visit (HOSPITAL_COMMUNITY): Payer: Medicaid Other | Admitting: Physical Therapy

## 2020-11-19 ENCOUNTER — Other Ambulatory Visit: Payer: Self-pay

## 2020-11-19 DIAGNOSIS — M79672 Pain in left foot: Secondary | ICD-10-CM

## 2020-11-19 DIAGNOSIS — R2689 Other abnormalities of gait and mobility: Secondary | ICD-10-CM

## 2020-11-19 DIAGNOSIS — R262 Difficulty in walking, not elsewhere classified: Secondary | ICD-10-CM

## 2020-11-19 NOTE — Therapy (Signed)
[place], [place], [address] Phone: [phone]   Fax:  [phone]  Physical Therapy Treatment  Patient Details  Name: Sandra [name] MRN: [id number] Date of Birth: 30-Dec-1975 Referring Provider (PT): Edrick Kins, [place]   Encounter Date: 11/19/2020   PT End of Session - 11/19/20 1033     Visit Number 2    Number of Visits 6    Date for PT Re-Evaluation 12/24/20    Authorization Type Medicaid [place] - Visit Number 2    Authorization - Number of Visits 3    Progress Note Due on Visit 3    PT Start Time [phone]    PT Stop Time 1005    PT Time Calculation (min) 49 min             Past Medical History:  Diagnosis Date   ADHD (attention deficit hyperactivity disorder)    Allergic rhinitis    Anemia    hx of   Bipolar 1 disorder ([place])    Bladder tumor    Cancer ([place])    bladder   Dizziness    Full dentures    GERD (gastroesophageal reflux disease)    Gross hematuria    Hyperlipidemia    Hypertension    Type 2 diabetes mellitus ([place])    Urgency of urination    dysuria, sui   Wears glasses     Past Surgical History:  Procedure Laterality Date   CYSTOSCOPY N/A 10/03/2015   Procedure: CYSTOSCOPY;  Surgeon: Irine Seal, MD;  Location: [facility];  Service: Urology;  Laterality: N/A;   CYSTOSCOPY WITH STENT PLACEMENT Right 10/31/2015   Procedure: CYSTOSCOPY WITH ATTEMPTED RIGHT URETERAL OPENING;  Surgeon: Irine Seal, MD;  [facility]: [facility];  Service: Urology;  Laterality: Right;   FOOT SURGERY Left    September 2022, broke 7 bones in left foot   INTERSTIM IMPLANT PLACEMENT N/A 03/14/2018   Procedure: Barrie Lyme IMPLANT FIRST STAGE;  Surgeon: Cleon Gustin, MD;  [facility];  Service: Urology;  Laterality: N/A;   INTERSTIM IMPLANT PLACEMENT N/A 03/30/2018   Procedure: Barrie Lyme IMPLANT SECOND STAGE;  Surgeon: Cleon Gustin, MD;   Location[facility];  Service: Urology;  Laterality: N/A;   LEFT HEART CATH AND CORONARY ANGIOGRAPHY N/A 05/31/2020   Procedure: LEFT HEART CATH AND CORONARY ANGIOGRAPHY;  Surgeon: Troy Sine, MD;  Location: [place] [facility];  Service: Cardiovascular;  Laterality: N/A;   MULTIPLE TOOTH EXTRACTIONS  2015   OVARIAN CYST REMOVAL Right 2004 approx   SHOULDER OPEN ROTATOR CUFF REPAIR Left 04/20/2019   Procedure: ROTATOR CUFF REPAIR SHOULDER OPEN;  Surgeon: Carole Civil, MD;  Location[facility];  Service: Orthopedics;  Laterality: Left;   TONSILLECTOMY  12/30/2004   TOTAL ABDOMINAL HYSTERECTOMY W/ BILATERAL SALPINGOOPHORECTOMY  05/16/2004   TRANSURETHRAL RESECTION OF BLADDER TUMOR N/A 10/03/2015   Procedure: TRANSURETHRAL RESECTION OF BLADDER TUMOR (TURBT);  Surgeon: Irine Seal, MD;  Location: [facility];  Service: Urology;  Laterality: N/A;   TRANSURETHRAL RESECTION OF BLADDER TUMOR N/A 10/31/2015   Procedure: RE-STAGINGG TRANSURETHRAL RESECTION OF BLADDER TUMOR (TURBT);  Surgeon: Irine Seal, MD;  Location: [facility];  Service: Urology;  Laterality: N/A;   TRANSURETHRAL RESECTION OF BLADDER TUMOR N/A 08/17/2016   Procedure: TRANSURETHRAL RESECTION OF BLADDER TUMOR (TURBT);  Surgeon: Cleon Gustin, MD;  Location[facility];  Service: Urology;  Laterality:  N/A;    There were no vitals filed for this visit.   Subjective Assessment - 11/19/20 0939     Subjective Pt states she's having pain in her dorsal arch at 7/10.  Comes today wearing flip flops    Currently in Pain? Yes    Pain Score 7     Pain Location Foot    Pain Orientation Left                               [facility] Adult PT Treatment/Exercise - 11/19/20 0001       Manual Therapy   Manual Therapy Edema management    Manual therapy comments Manual therapy completed seperate from all other skilled interventions..    Edema Management to Lt foot with elevation      Ankle  Exercises: Stretches   Gastroc Stretch 4 reps;20 seconds;Limitations    Gastroc Stretch Limitations long sitting with towel      Ankle Exercises: Seated   Towel Crunch Limitations    Towel Crunch Limitations 2 minutes    Towel Inversion/Eversion 5 reps    Heel Raises 15 reps    Toe Raise 15 reps                       PT Short Term Goals - 11/19/20 0951       PT SHORT TERM GOAL #1   Title Patient will be independent with HEP in order to improve functional outcomes.    Time 3    Period Weeks    Status On-going    Target Date 12/03/20      PT SHORT TERM GOAL #2   Title Patient will report at least 25% improvement in symptoms for improved quality of life.    Baseline 8/10 left foot pain with walking    Time 3    Period Weeks    Status On-going    Target Date 12/03/20      PT SHORT TERM GOAL #3   Title Demo improved gait speed/tolerance as evidenced by 275 ft 2MWT    Baseline 226 ft with antalgic pattern    Time 3    Period Weeks    Status On-going    Target Date 12/03/20               PT Long Term Goals - 11/19/20 0951       PT LONG TERM GOAL #1   Title Demo improved balance and WBing tolerance as evidenced by 15 sec single leg stance    Baseline 10 sec RLE, 3 sec LLE    Time 6    Period Weeks    Status On-going                   Plan - 11/19/20 1049     Clinical Impression Statement Reviewed goals and POC moving forward.  Pt reports misplacing her HEP so a new one was created for her and placed in a folder to help her keep up with it.  AROM and strengthening exercises began as well as gastroc stretch instruction using towel in long sitting.Edema massage completed at end of session to help reduce pain and swelling.  Educated patient on footwear and encouraged to obtain order from primary for diabetic shoes.  Discouraged use of flip flops due to high fall risk and for improving gait quality.  Also explained closed in shoe is  more protective  of her toes and would help with swelling.    Personal Factors and Comorbidities Comorbidity 1;Time since onset of injury/illness/exacerbation    Comorbidities DM    Examination-Activity Limitations Locomotion Level;Transfers;Stairs;Stand;Carry;Reach Overhead    Examination-Participation Restrictions Cleaning;Community Activity;Yard Work;Shop;Meal Prep    Stability/Clinical Decision Making Stable/Uncomplicated    Rehab Potential Good    PT Frequency 1x / week    PT Duration 6 weeks    PT Treatment/Interventions ADLs/Self Care Home Management;Electrical Stimulation;DME Instruction;Moist Heat;Gait training;Functional mobility training;Therapeutic activities;Therapeutic exercise;Balance training;Neuromuscular re-education;Patient/family education;Passive range of motion;Energy conservation;Splinting;Taping;Spinal Manipulations;Joint Manipulations    PT Next Visit Plan progress stength, ROM and balance.  Begin static balance challenges and progress to dynamic.  Follow up on footwear.    PT Home Exercise Plan towel toes, desensitization, toe spread    Consulted and Agree with Plan of Care Patient             Patient will benefit from skilled therapeutic intervention in order to improve the following deficits and impairments:  Abnormal gait, Decreased activity tolerance, Decreased balance, Decreased mobility, Decreased range of motion, Decreased strength, Increased edema, Difficulty walking, Pain  Visit Diagnosis: Pain in left foot  Other abnormalities of gait and mobility  Difficulty in walking, not elsewhere classified     Problem List Patient Active Problem List   Diagnosis Date Noted   Rectal bleeding 05/17/2020   Gastroesophageal reflux disease 05/17/2020   Dysphagia 05/17/2020   Essential hypertension, benign 05/15/2019   S/P left rotator cuff repair 04/20/19  04/27/2019   Complete tear of left rotator cuff    Uncontrolled type 2 diabetes mellitus with hyperglycemia ([facility])  10/10/2018   Mixed hyperlipidemia 10/10/2018   Sacroiliac joint pain 02/23/2017   Chronic low back pain 02/09/2017   Somatic dysfunction of left sacroiliac joint 02/09/2017   GAD (generalized anxiety disorder) 12/17/2016   MDD (major depressive disorder), recurrent episode, moderate ([place]) 12/17/2016   Attention deficit hyperactivity disorder (ADHD) 12/17/2016   Postoperative anemia due to acute blood loss 11/04/2015   Bladder cancer ([place]) 10/31/2015   CARPAL TUNNEL SYNDROME 10/02/2009   Larsen Zettel Sula Soda, PTA/CLT, WTA [phone]  Teena Irani, PTA 11/19/2020, 10:52 AM  [place] [address] [place], [place], [address] Phone: [phone]   Fax:  [phone]  Name: Sandra [name] MRN: [id number] Date of Birth: [birth year]

## 2020-11-22 ENCOUNTER — Other Ambulatory Visit: Payer: Self-pay | Admitting: Podiatry

## 2020-11-25 ENCOUNTER — Other Ambulatory Visit: Payer: Self-pay | Admitting: Podiatry

## 2020-11-26 NOTE — Telephone Encounter (Signed)
Please verify medication has been signed and sent 11/25/20

## 2020-11-26 NOTE — Telephone Encounter (Signed)
Responded on another encounter

## 2020-11-26 NOTE — Telephone Encounter (Signed)
Notations made on another encounter, routing to close

## 2020-11-26 NOTE — Telephone Encounter (Signed)
Please verify that medication has been signed and sent 11/25/20

## 2020-11-26 NOTE — Telephone Encounter (Signed)
Notation made on another encounter, routing to close

## 2020-11-27 ENCOUNTER — Other Ambulatory Visit: Payer: Self-pay | Admitting: Podiatry

## 2020-11-27 ENCOUNTER — Telehealth: Payer: Self-pay | Admitting: Podiatry

## 2020-11-27 NOTE — Telephone Encounter (Signed)
Patient called stating she has sent in a refill request on MyChart as well as her pharmacy and has yet to hear back. She is requesting a refill on. HYDROcodone-acetaminophen (NORCO) 10-325 MG tablet Please advise.

## 2020-11-28 ENCOUNTER — Encounter (HOSPITAL_COMMUNITY): Payer: Self-pay

## 2020-11-28 ENCOUNTER — Other Ambulatory Visit: Payer: Self-pay

## 2020-11-28 ENCOUNTER — Telehealth: Payer: Self-pay | Admitting: Podiatry

## 2020-11-28 ENCOUNTER — Other Ambulatory Visit: Payer: Self-pay | Admitting: Podiatry

## 2020-11-28 ENCOUNTER — Ambulatory Visit (HOSPITAL_COMMUNITY): Payer: Medicaid Other

## 2020-11-28 DIAGNOSIS — M79672 Pain in left foot: Secondary | ICD-10-CM | POA: Diagnosis not present

## 2020-11-28 DIAGNOSIS — R2681 Unsteadiness on feet: Secondary | ICD-10-CM

## 2020-11-28 DIAGNOSIS — R262 Difficulty in walking, not elsewhere classified: Secondary | ICD-10-CM

## 2020-11-28 DIAGNOSIS — R2689 Other abnormalities of gait and mobility: Secondary | ICD-10-CM

## 2020-11-28 NOTE — Therapy (Signed)
[place], [place], [address] Phone: [phone]   Fax:  [phone]  Physical Therapy Treatment  Patient Details  Name: Sandra [name] MRN: [id number] Date of Birth: [birth year] Referring Provider (PT): Edrick Kins, [place]   Encounter Date: 11/28/2020   PT End of Session - 11/28/20 1714     Visit Number 3    Number of Visits 6    Date for PT Re-Evaluation 12/24/20    Authorization Type Medicaid [place] Time Period 3 visits approved 11/2-->11/22    Authorization - Visit Number 2    Authorization - Number of Visits 3    Progress Note Due on Visit 3    PT Start Time 4010    PT Stop Time 2725    PT Time Calculation (min) 38 min    Activity Tolerance Patient tolerated treatment well;Patient limited by pain;No increased pain    Behavior During Therapy WFL for tasks assessed/performed             Past Medical History:  Diagnosis Date   ADHD (attention deficit hyperactivity disorder)    Allergic rhinitis    Anemia    hx of   Bipolar 1 disorder (HCC)    Bladder tumor    Cancer ([place])    bladder   Dizziness    Full dentures    GERD (gastroesophageal reflux disease)    Gross hematuria    Hyperlipidemia    Hypertension    Type 2 diabetes mellitus ([place])    Urgency of urination    dysuria, sui   Wears glasses     Past Surgical History:  Procedure Laterality Date   CYSTOSCOPY N/A 10/03/2015   Procedure: CYSTOSCOPY;  Surgeon: Irine Seal, MD;  Location: [facility];  Service: Urology;  Laterality: N/A;   CYSTOSCOPY WITH STENT PLACEMENT Right 10/31/2015   Procedure: CYSTOSCOPY WITH ATTEMPTED RIGHT URETERAL OPENING;  Surgeon: Irine Seal, MD;  Location: [facility];  Service: Urology;  Laterality: Right;   FOOT SURGERY Left    September 2022, broke 7 bones in left foot   INTERSTIM IMPLANT PLACEMENT N/A 03/14/2018   Procedure: Barrie Lyme IMPLANT FIRST STAGE;   Surgeon: Cleon Gustin, MD;  Location: [facility];  Service: Urology;  Laterality: N/A;   INTERSTIM IMPLANT PLACEMENT N/A 03/30/2018   Procedure: Barrie Lyme IMPLANT SECOND STAGE;  Surgeon: Cleon Gustin, MD;  Location: [facility];  Service: Urology;  Laterality: N/A;   LEFT HEART CATH AND CORONARY ANGIOGRAPHY N/A 05/31/2020   Procedure: LEFT HEART CATH AND CORONARY ANGIOGRAPHY;  Surgeon: Troy Sine, MD;  Location: [facility];  Service: Cardiovascular;  Laterality: N/A;   MULTIPLE TOOTH EXTRACTIONS  2015   OVARIAN CYST REMOVAL Right 2004 approx   SHOULDER OPEN ROTATOR CUFF REPAIR Left 04/20/2019   Procedure: ROTATOR CUFF REPAIR SHOULDER OPEN;  Surgeon: Carole Civil, MD;  Location: [facility];  Service: Orthopedics;  Laterality: Left;   TONSILLECTOMY  12/30/2004   TOTAL ABDOMINAL HYSTERECTOMY W/ BILATERAL SALPINGOOPHORECTOMY  05/16/2004   TRANSURETHRAL RESECTION OF BLADDER TUMOR N/A 10/03/2015   Procedure: TRANSURETHRAL RESECTION OF BLADDER TUMOR (TURBT);  Surgeon: Irine Seal, MD;  Location: [facility];  Service: Urology;  Laterality: N/A;   TRANSURETHRAL RESECTION OF BLADDER TUMOR N/A 10/31/2015   Procedure: RE-STAGINGG TRANSURETHRAL RESECTION OF BLADDER TUMOR (TURBT);  Surgeon: Irine Seal, MD;  Location: [facility];  Service: Urology;  Laterality:  N/A;   TRANSURETHRAL RESECTION OF BLADDER TUMOR N/A 08/17/2016   Procedure: TRANSURETHRAL RESECTION OF BLADDER TUMOR (TURBT);  Surgeon: Cleon Gustin, MD;  Location[facility];  Service: Urology;  Laterality: N/A;    There were no vitals filed for this visit.   Subjective Assessment - 11/28/20 1709     Subjective Pt arrived with tennis shoes, reports pain in shoes first time shes worn them.  Reports some concern with 3rd, 4th and 5th toe movements.  Pain scale 7/10.  Admits to not completing HEP at    Pertinent History DM    Currently in Pain? Yes    Pain Score 7     Pain Location Foot     Pain Orientation Left    Pain Descriptors / Indicators Aching;Burning    Pain Type Acute pain    Pain Onset More than a month ago    Pain Frequency Intermittent    Aggravating Factors  walking    Pain Relieving Factors rest    Effect of Pain on Daily Activities limits                [facility] PT Assessment - 11/28/20 0001       Assessment   Medical Diagnosis Status post left foot surgery    Referring Provider (PT) Edrick Kins, DPM    Onset Date/Surgical Date 09/26/20    Hand Dominance Left    Next MD Visit 12/09/20                           [facility] Adult PT Treatment/Exercise - 11/28/20 0001       Ankle Exercises: Seated   Towel Crunch Limitations   2 min   [place] Pickup 10x    Heel Raises 15 reps    Toe Raise 15 reps      Additional Ankle Exercises DO NOT USE   Towel Crunch Limitations 2 minutes      Ankle Exercises: Standing   Heel Raises 10 reps    Toe Raise 10 reps    Other Standing Ankle Exercises tandem stance 2x 30"    Other Standing Ankle Exercises SLS Lt 22", Rt 42" max                     PT Education - 11/28/20 1746     Education Details Importance of compliance wiht HEP for maximal benefits    Methods Explanation;Handout    Comprehension Verbalized understanding              PT Short Term Goals - 11/19/20 0951       PT SHORT TERM GOAL #1   Title Patient will be independent with HEP in order to improve functional outcomes.    Time 3    Period Weeks    Status On-going    Target Date 12/03/20      PT SHORT TERM GOAL #2   Title Patient will report at least 25% improvement in symptoms for improved quality of life.    Baseline 8/10 left foot pain with walking    Time 3    Period Weeks    Status On-going    Target Date 12/03/20      PT SHORT TERM GOAL #3   Title Demo improved gait speed/tolerance as evidenced by 275 ft 2MWT    Baseline 226 ft with antalgic pattern    Time 3    Period Weeks  Status  On-going    Target Date 12/03/20               PT Long Term Goals - 11/19/20 0951       PT LONG TERM GOAL #1   Title Demo improved balance and WBing tolerance as evidenced by 15 sec single leg stance    Baseline 10 sec RLE, 3 sec LLE    Time 6    Period Weeks    Status On-going                   Plan - 11/28/20 1746     Clinical Impression Statement Pt educated on imoprtance of HEP compliance for maximal benefits, additional prinout given in folder to keep up with.  Pt stated increased pain wearing tennis shoes, pt stated MD has order for diabetic shoes, encouraged to look for wide toe base to reduce pressure on toes.  Pt's main concern is limited movement 3rd, 4th, and 5th.  Pt educated on benefits with intrisic mm strengthening and compliance wiht HEP to assist.  Progressed to standing balalnce activities as reports multiple LOBs daily.  Added tandem stance and SLS to HEP, encouraged to complete safely by counter or sink at home.  No reports of pain through session.    Personal Factors and Comorbidities Comorbidity 1;Time since onset of injury/illness/exacerbation    Comorbidities DM    Examination-Activity Limitations Locomotion Level;Transfers;Stairs;Stand;Carry;Reach Overhead    Examination-Participation Restrictions Cleaning;Community Activity;Yard Work;Shop;Meal Prep    Stability/Clinical Decision Making Stable/Uncomplicated    Clinical Decision Making Low    Rehab Potential Good    PT Frequency 1x / week    PT Duration 6 weeks    PT Treatment/Interventions ADLs/Self Care Home Management;Electrical Stimulation;DME Instruction;Moist Heat;Gait training;Functional mobility training;Therapeutic activities;Therapeutic exercise;Balance training;Neuromuscular re-education;Patient/family education;Passive range of motion;Energy conservation;Splinting;Taping;Spinal Manipulations;Joint Manipulations    PT Next Visit Plan Reviewed goals and submit to Medicaid for more visits.   progress stength, ROM and balance.  Continue static balance challenges and progress to dynamic.  Follow up on footwear.    PT Home Exercise Plan towel toes, desensitization, toe spread; 11/17: towel curl, heel/toe raises, toe spread, tandem stance and SLS by counter    Consulted and Agree with Plan of Care Patient             Patient will benefit from skilled therapeutic intervention in order to improve the following deficits and impairments:  Abnormal gait, Decreased activity tolerance, Decreased balance, Decreased mobility, Decreased range of motion, Decreased strength, Increased edema, Difficulty walking, Pain  Visit Diagnosis: Pain in left foot  Other abnormalities of gait and mobility  Difficulty in walking, not elsewhere classified  Unsteadiness on feet     Problem List Patient Active Problem List   Diagnosis Date Noted   Rectal bleeding 05/17/2020   Gastroesophageal reflux disease 05/17/2020   Dysphagia 05/17/2020   Essential hypertension, benign 05/15/2019   S/P left rotator cuff repair 04/20/19  04/27/2019   Complete tear of left rotator cuff    Uncontrolled type 2 diabetes mellitus with hyperglycemia (HCC) 10/10/2018   Mixed hyperlipidemia 10/10/2018   Sacroiliac joint pain 02/23/2017   Chronic low back pain 02/09/2017   Somatic dysfunction of left sacroiliac joint 02/09/2017   GAD (generalized anxiety disorder) 12/17/2016   MDD (major depressive disorder), recurrent episode, moderate ([place]) 12/17/2016   Attention deficit hyperactivity disorder (ADHD) 12/17/2016   Postoperative anemia due to acute blood loss 11/04/2015   Bladder cancer ([place]) 10/31/2015  CARPAL TUNNEL SYNDROME 10/02/2009   Ihor Austin, LPTA/CLT; CBIS [phone]  Aldona Lento, PTA 11/28/2020, 6:41 PM  [place], [place], [address] Phone: [phone]   Fax:  [phone]  Name: Sandra [name] MRN:  [id number] Date of Birth: Jul 04, 1975

## 2020-11-28 NOTE — Patient Instructions (Signed)
Towel Curl    Sitting, crimp a towel up with toes of one foot. Relax foot by spreading towel out again. Repeat with other foot. Repeat ____ times. Do ____ sessions per day.  http://gt2.exer.us/412   Copyright  VHI. All rights reserved.   Toe Spread    With foot bare, spread and straighten toes, then squeeze and curl toes. Hold ____ seconds each position. Repeat ____ times. Do ____ sessions per day.  Copyright  VHI. All rights reserved.   Toe / Heel Raise (Standing)    Standing with support, raise heels, then rock back on heels and raise toes. Repeat 10 times.  Copyright  VHI. All rights reserved.   Tandem Stance    Standing by counter/sink for safety.   Place Right foot in front of left, heel touching toe both feet "straight ahead". Stand on Foot Triangle of Support with both feet.  Balance in this position 30 seconds.   Do with left foot in front of right.  Copyright  VHI. All rights reserved.   Single Leg Balance: Eyes Open    Stand on right leg with eyes open. Hold 30 seconds. 5 reps per day.  http://ggbe.exer.us/5   Copyright  VHI. All rights reserved.

## 2020-11-28 NOTE — Telephone Encounter (Signed)
Patient called again this morning , she still has not heard back about the refill of her medication , she needs it before she goes to therapy today at 5pm.    She uses [facility] in [place] ([place]) 10-325 MG tablet

## 2020-11-29 ENCOUNTER — Other Ambulatory Visit: Payer: Self-pay | Admitting: Podiatry

## 2020-11-29 ENCOUNTER — Telehealth: Payer: Self-pay | Admitting: Podiatry

## 2020-11-29 MED ORDER — HYDROCODONE-ACETAMINOPHEN 10-325 MG PO TABS: ORAL_TABLET | ORAL | 0 refills | Status: DC

## 2020-11-29 NOTE — Telephone Encounter (Signed)
Medication filledd 11/28/20

## 2020-11-29 NOTE — Telephone Encounter (Signed)
Patient called the office wanting a refill of her prescription.   Please advise .Marland Kitchen

## 2020-11-29 NOTE — Progress Notes (Signed)
Patient called the answering service tonight stating that she is out of pain medication asking for refill.  She said that she has called the office several times asking for this.  Upon review of the chart there have been several requests.  I have refilled the pain medicine for her and she is to follow with Dr. Amalia Hailey as scheduled.

## 2020-11-29 NOTE — Telephone Encounter (Signed)
Medication filled on 11817/22

## 2020-11-29 NOTE — Telephone Encounter (Signed)
Medication filled 11/28/20

## 2020-12-02 NOTE — Telephone Encounter (Signed)
Routing to close encounter. 

## 2020-12-03 ENCOUNTER — Ambulatory Visit (HOSPITAL_COMMUNITY): Payer: Medicaid Other | Admitting: Physical Therapy

## 2020-12-03 ENCOUNTER — Other Ambulatory Visit: Payer: Self-pay

## 2020-12-03 ENCOUNTER — Encounter (HOSPITAL_COMMUNITY): Payer: Self-pay | Admitting: Physical Therapy

## 2020-12-03 ENCOUNTER — Other Ambulatory Visit: Payer: Self-pay | Admitting: Podiatry

## 2020-12-03 DIAGNOSIS — R2689 Other abnormalities of gait and mobility: Secondary | ICD-10-CM

## 2020-12-03 DIAGNOSIS — R262 Difficulty in walking, not elsewhere classified: Secondary | ICD-10-CM

## 2020-12-03 DIAGNOSIS — M79672 Pain in left foot: Secondary | ICD-10-CM | POA: Diagnosis not present

## 2020-12-03 NOTE — Telephone Encounter (Signed)
Encounter closed

## 2020-12-03 NOTE — Therapy (Signed)
[place], [place], [address] Phone: [phone]   Fax:  [phone]  Physical Therapy Treatment  Patient Details  Name: Sandra [name] MRN: [id number] Date of Birth: October 31, 1975 Referring Provider (PT): Edrick Kins, [place]   Encounter Date: 12/03/2020   PT End of Session - 12/03/20 3888     Visit Number 4    Number of Visits 6    Date for PT Re-Evaluation 12/24/20    Authorization Type Medicaid [place] Time Period 3 visits approved 11/2-->11/22 Josem Kaufmann requested on 11/22    Authorization - Visit Number 3    Authorization - Number of Visits 3    Progress Note Due on Visit 3    PT Start Time 0922   late to checkin   PT Stop Time 0955    PT Time Calculation (min) 33 min    Activity Tolerance Patient tolerated treatment well;Patient limited by pain;No increased pain    Behavior During Therapy WFL for tasks assessed/performed             Past Medical History:  Diagnosis Date   ADHD (attention deficit hyperactivity disorder)    Allergic rhinitis    Anemia    hx of   Bipolar 1 disorder (HCC)    Bladder tumor    Cancer ([place])    bladder   Dizziness    Full dentures    GERD (gastroesophageal reflux disease)    Gross hematuria    Hyperlipidemia    Hypertension    Type 2 diabetes mellitus ([place])    Urgency of urination    dysuria, sui   Wears glasses     Past Surgical History:  Procedure Laterality Date   CYSTOSCOPY N/A 10/03/2015   Procedure: CYSTOSCOPY;  Surgeon: Irine Seal, MD;  Location: [facility];  Service: Urology;  Laterality: N/A;   CYSTOSCOPY WITH STENT PLACEMENT Right 10/31/2015   Procedure: CYSTOSCOPY WITH ATTEMPTED RIGHT URETERAL OPENING;  Surgeon: Irine Seal, MD;  Location: [facility];  Service: Urology;  Laterality: Right;   FOOT SURGERY Left    September 2022, broke 7 bones in left foot   INTERSTIM IMPLANT PLACEMENT N/A 03/14/2018    Procedure: Barrie Lyme IMPLANT FIRST STAGE;  Surgeon: Cleon Gustin, MD;  [facility];  Service: Urology;  Laterality: N/A;   INTERSTIM IMPLANT PLACEMENT N/A 03/30/2018   Procedure: Barrie Lyme IMPLANT SECOND STAGE;  Surgeon: Cleon Gustin, MD;  Location: [facility];  Service: Urology;  Laterality: N/A;   LEFT HEART CATH AND CORONARY ANGIOGRAPHY N/A 05/31/2020   Procedure: LEFT HEART CATH AND CORONARY ANGIOGRAPHY;  Surgeon: Troy Sine, MD;  Location: [facility];  Service: Cardiovascular;  Laterality: N/A;   MULTIPLE TOOTH EXTRACTIONS  2015   OVARIAN CYST REMOVAL Right 2004 approx   SHOULDER OPEN ROTATOR CUFF REPAIR Left 04/20/2019   Procedure: ROTATOR CUFF REPAIR SHOULDER OPEN;  Surgeon: Carole Civil, MD;  Location[facility];  Service: Orthopedics;  Laterality: Left;   TONSILLECTOMY  12/30/2004   TOTAL ABDOMINAL HYSTERECTOMY W/ BILATERAL SALPINGOOPHORECTOMY  05/16/2004   TRANSURETHRAL RESECTION OF BLADDER TUMOR N/A 10/03/2015   Procedure: TRANSURETHRAL RESECTION OF BLADDER TUMOR (TURBT);  Surgeon: Irine Seal, MD;  Location: [facility];  Service: Urology;  Laterality: N/A;   TRANSURETHRAL RESECTION OF BLADDER TUMOR N/A 10/31/2015   Procedure: RE-STAGINGG TRANSURETHRAL RESECTION OF BLADDER TUMOR (TURBT);  Surgeon: Irine Seal, MD;  Location:  [facility];  Service: Urology;  Laterality: N/A;   TRANSURETHRAL RESECTION OF BLADDER TUMOR N/A 08/17/2016   Procedure: TRANSURETHRAL RESECTION OF BLADDER TUMOR (TURBT);  Surgeon: Cleon Gustin, MD;  [facility];  Service: Urology;  Laterality: N/A;    There were no vitals filed for this visit.   Subjective Assessment - 12/03/20 0959     Subjective States her pain her foot is horrible. States that yesterday it was swollen and painful. Today's pain is rated at 8/10 along the lateral toes and it is still swollen.    Pertinent History DM    Currently in Pain? Yes    Pain Score 8     Pain  Location Foot    Pain Orientation Left    Pain Descriptors / Indicators Aching    Pain Onset More than a month ago                [facility] PT Assessment - 12/03/20 0001       Assessment   Medical Diagnosis Status post left foot surgery    Referring Provider (PT) Edrick Kins, DPM    Onset Date/Surgical Date 09/26/20    Hand Dominance Left    Next MD Visit 12/09/20      Observation/Other Assessments   Observations swelling and purple/red toes and forefoot on left      Figure 8 Edema   Figure 8 - Right  47.5cm    Figure 8 - Left  48.0cm   swelling mainly noted at toes     Functional Tests   Functional tests Single leg stance      Single Leg Stance   Comments 30 sec RLE, 14 sec LLE      Ambulation/Gait   Ambulation/Gait Yes    Ambulation/Gait Assistance 7: Independent    Ambulation Distance (Feet) 336 Feet    Assistive device None    Gait Pattern Antalgic    Ambulation Surface Level;Indoor    Gait Comments 2MW                           [facility] Adult PT Treatment/Exercise - 12/03/20 0001       Ankle Exercises: Seated   Heel Raises --   2 minutes   Other Seated Ankle Exercises toe extension/flexion 2 minutes    Other Seated Ankle Exercises inversion eversion - 2 minutes                     PT Education - 12/03/20 0957     Education Details on use of compression garements, on swelling, on current POC    Person(s) Educated Patient    Methods Explanation    Comprehension Verbalized understanding              PT Short Term Goals - 12/03/20 0929       PT SHORT TERM GOAL #1   Title Patient will be independent with HEP in order to improve functional outcomes.    Baseline performs exercises daily    Time 3    Period Weeks    Status Achieved    Target Date 12/03/20      PT SHORT TERM GOAL #2   Title Patient will report at least 25% improvement in symptoms for improved quality of life.    Baseline 30% better    Time 3     Period Weeks    Status Achieved  Target Date 12/03/20      PT SHORT TERM GOAL #3   Title Demo improved gait speed/tolerance as evidenced by 275 ft 2MWT    Baseline 336 ft with antalgic pattern    Time 3    Period Weeks    Status Achieved    Target Date 12/03/20               PT Long Term Goals - 12/03/20 0933       PT LONG TERM GOAL #1   Title Demo improved balance and WBing tolerance as evidenced by 15 sec single leg stance    Baseline 30 sec RLE, 14 sec LLE    Time 6    Period Weeks    Status On-going      PT LONG TERM GOAL #2   Title Patient will report at least 50% improvement in symptoms for improved quality of life.    Time 4    Period Weeks    Status New    Target Date 12/31/20                   Plan - 12/03/20 4098     Clinical Impression Statement All but one goal met at this time. Resubmitted Medicaid auth on this date to continue with current POC. Patient's primary concern is lack of active ROM in her surgical toes. Educated patient on current presentation, on getting swelling down with compression, AROM, and elevation. Patient will continue with current POC as tolerated.    Personal Factors and Comorbidities Comorbidity 1;Time since onset of injury/illness/exacerbation    Comorbidities DM    Examination-Activity Limitations Locomotion Level;Transfers;Stairs;Stand;Carry;Reach Overhead    Examination-Participation Restrictions Cleaning;Community Activity;Yard Work;Shop;Meal Prep    Stability/Clinical Decision Making Stable/Uncomplicated    Rehab Potential Good    PT Frequency 1x / week    PT Duration 6 weeks    PT Treatment/Interventions ADLs/Self Care Home Management;Electrical Stimulation;DME Instruction;Moist Heat;Gait training;Functional mobility training;Therapeutic activities;Therapeutic exercise;Balance training;Neuromuscular re-education;Patient/family education;Passive range of motion;Energy conservation;Splinting;Taping;Spinal  Manipulations;Joint Manipulations    PT Next Visit Plan progress stength, ROM and balance.  Continue static balance challenges and progress to dynamic.  Follow up on footwear.    PT Home Exercise Plan towel toes, desensitization, toe spread; 11/17: towel curl, heel/toe raises, toe spread, tandem stance and SLS by counter    Consulted and Agree with Plan of Care Patient             Patient will benefit from skilled therapeutic intervention in order to improve the following deficits and impairments:  Abnormal gait, Decreased activity tolerance, Decreased balance, Decreased mobility, Decreased range of motion, Decreased strength, Increased edema, Difficulty walking, Pain  Visit Diagnosis: Pain in left foot  Other abnormalities of gait and mobility  Difficulty in walking, not elsewhere classified     Problem List Patient Active Problem List   Diagnosis Date Noted   Rectal bleeding 05/17/2020   Gastroesophageal reflux disease 05/17/2020   Dysphagia 05/17/2020   Essential hypertension, benign 05/15/2019   S/P left rotator cuff repair 04/20/19  04/27/2019   Complete tear of left rotator cuff    Uncontrolled type 2 diabetes mellitus with hyperglycemia (HCC) 10/10/2018   Mixed hyperlipidemia 10/10/2018   Sacroiliac joint pain 02/23/2017   Chronic low back pain 02/09/2017   Somatic dysfunction of left sacroiliac joint 02/09/2017   GAD (generalized anxiety disorder) 12/17/2016   MDD (major depressive disorder), recurrent episode, moderate ([place]) 12/17/2016   Attention deficit hyperactivity disorder (ADHD)  12/17/2016   Postoperative anemia due to acute blood loss 11/04/2015   Bladder cancer ([place]) 10/31/2015   CARPAL TUNNEL SYNDROME 10/02/2009   10:09 AM, 12/03/20 Jerene Pitch, DPT Physical Therapy with [facility]  [phone] office   [place] [address] [place], [place], [address] Phone: [phone]   Fax:   [phone]  Name: Sandra [name] MRN: [id number] Date of Birth: [birth year]

## 2020-12-04 ENCOUNTER — Other Ambulatory Visit: Payer: Self-pay | Admitting: Podiatry

## 2020-12-06 ENCOUNTER — Other Ambulatory Visit: Payer: Self-pay | Admitting: Podiatry

## 2020-12-06 MED ORDER — HYDROCODONE-ACETAMINOPHEN 5-325 MG PO TABS: 1.0000 | ORAL_TABLET | Freq: Four times a day (QID) | ORAL | 0 refills | Status: DC | PRN

## 2020-12-06 NOTE — Progress Notes (Signed)
Patient called on-call provider had surgery with Dr. Amalia Hailey 6 weeks ago and is having a lot of pain and swelling today and difficulty walking on it.  States that she was supposed to get a refill of her pain medication before the holiday but didn't. I advised I would refill this and recommend staying off of her foot, icing, elevating, and wearing an ACE bandage and f/u with Dr. Amalia Hailey as scheduled.

## 2020-12-09 ENCOUNTER — Other Ambulatory Visit: Payer: Self-pay

## 2020-12-09 ENCOUNTER — Ambulatory Visit (INDEPENDENT_AMBULATORY_CARE_PROVIDER_SITE_OTHER): Payer: Medicaid Other

## 2020-12-09 ENCOUNTER — Encounter: Payer: Medicaid Other | Admitting: Podiatry

## 2020-12-09 ENCOUNTER — Ambulatory Visit (INDEPENDENT_AMBULATORY_CARE_PROVIDER_SITE_OTHER): Payer: Medicaid Other | Admitting: Podiatry

## 2020-12-09 DIAGNOSIS — Z9889 Other specified postprocedural states: Secondary | ICD-10-CM

## 2020-12-09 DIAGNOSIS — S92311S Displaced fracture of first metatarsal bone, right foot, sequela: Secondary | ICD-10-CM

## 2020-12-09 NOTE — Telephone Encounter (Signed)
Please advise 

## 2020-12-09 NOTE — Progress Notes (Signed)
   Subjective:  Patient presents today status post metatarsal head resection 3, 4, 5 left foot. DOS: 09/26/2020.  Patient states that she continues to have some pain and tenderness associated to the toes.  No new complaints at this time  Past Medical History:  Diagnosis Date   ADHD (attention deficit hyperactivity disorder)    Allergic rhinitis    Anemia    hx of   Bipolar 1 disorder (HCC)    Bladder tumor    Cancer ([place])    bladder   Dizziness    Full dentures    GERD (gastroesophageal reflux disease)    Gross hematuria    Hyperlipidemia    Hypertension    Type 2 diabetes mellitus ([place])    Urgency of urination    dysuria, sui   Wears glasses       Objective/Physical Exam Neurovascular status intact.  Skin incisions appear to be well coapted and healed. No sign of infectious process noted. No dehiscence. No active bleeding noted.  Minimal edema noted to the surgical extremity.  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  Toes are in rectus alignment. Osteotomy sites appear stable with routine healing  Assessment: 1. s/p metatarsal head resections 3, 4, 5 left. DOS: 09/26/2020   Plan of Care:  1. Patient was evaluated.   2.  Continue daily range of motion exercises 3.  Continue physical therapy at [facility].  Patient is going once per week 4.  Patient is now about 2-1/2 months postoperative.  She may slowly increase to full activity no restrictions.  The patient is basically fully healed postoperatively 5.  Appointment with Pedorthist for diabetic shoes and insoles which should help alleviate some of the patient's symptoms 6.  Patient needs to begin reducing her opioid pain medications.  Patient understands 7.  Return to clinic as needed with me.   Edrick Kins, DPM [facility]  Dr. Edrick Kins, DPM    [address], [place] [address]                Office [phone]  Fax [phone]

## 2020-12-10 ENCOUNTER — Other Ambulatory Visit: Payer: Self-pay | Admitting: Podiatry

## 2020-12-10 ENCOUNTER — Ambulatory Visit (HOSPITAL_COMMUNITY): Payer: Medicaid Other

## 2020-12-10 DIAGNOSIS — M79672 Pain in left foot: Secondary | ICD-10-CM | POA: Diagnosis not present

## 2020-12-10 DIAGNOSIS — R262 Difficulty in walking, not elsewhere classified: Secondary | ICD-10-CM

## 2020-12-10 DIAGNOSIS — R2689 Other abnormalities of gait and mobility: Secondary | ICD-10-CM

## 2020-12-10 DIAGNOSIS — R2681 Unsteadiness on feet: Secondary | ICD-10-CM

## 2020-12-10 NOTE — Therapy (Signed)
[facility] [address] [place], [place], [address] Phone: [phone]   Fax:  [phone]  Physical Therapy Treatment and D/C Summary  Patient Details  Name: Sandra [name] MRN: [id number] Date of Birth: 1975/04/10 Referring Provider (PT): Edrick Kins, DPM  PHYSICAL THERAPY DISCHARGE SUMMARY  Visits from Start of Care: 5  Current functional level related to goals / functional outcomes: See below   Remaining deficits: Pain in toes 3-5 left foot   Education / Equipment: HEP   Patient agrees to discharge. Patient goals were met. Patient is being discharged due to being pleased with the current functional level.  Encounter Date: 12/10/2020   PT End of Session - 12/10/20 1556     Visit Number 5    Number of Visits 6    Date for PT Re-Evaluation 12/24/20    Authorization Type Medicaid [place]    Authorization Time Period 6 visits approved to 12/29/20    Authorization - Visit Number 5    Authorization - Number of Visits 6    Progress Note Due on Visit 13    PT Start Time 1600    PT Stop Time 1645    PT Time Calculation (min) 45 min    Activity Tolerance Patient tolerated treatment well;Patient limited by pain;No increased pain    Behavior During Therapy WFL for tasks assessed/performed             Past Medical History:  Diagnosis Date   ADHD (attention deficit hyperactivity disorder)    Allergic rhinitis    Anemia    hx of   Bipolar 1 disorder (HCC)    Bladder tumor    Cancer ([place])    bladder   Dizziness    Full dentures    GERD (gastroesophageal reflux disease)    Gross hematuria    Hyperlipidemia    Hypertension    Type 2 diabetes mellitus ([place])    Urgency of urination    dysuria, sui   Wears glasses     Past Surgical History:  Procedure Laterality Date   CYSTOSCOPY N/A 10/03/2015   Procedure: CYSTOSCOPY;  Surgeon: Irine Seal, MD;  Location: [facility];  Service: Urology;   Laterality: N/A;   CYSTOSCOPY WITH STENT PLACEMENT Right 10/31/2015   Procedure: CYSTOSCOPY WITH ATTEMPTED RIGHT URETERAL OPENING;  Surgeon: Irine Seal, MD;  Location[facility];  Service: Urology;  Laterality: Right;   FOOT SURGERY Left    September 2022, broke 7 bones in left foot   INTERSTIM IMPLANT PLACEMENT N/A 03/14/2018   Procedure: Barrie Lyme IMPLANT FIRST STAGE;  Surgeon: Cleon Gustin, MD;  Location: [facility];  Service: Urology;  Laterality: N/A;   INTERSTIM IMPLANT PLACEMENT N/A 03/30/2018   Procedure: Barrie Lyme IMPLANT SECOND STAGE;  Surgeon: Cleon Gustin, MD;  Location: [facility];  Service: Urology;  Laterality: N/A;   LEFT HEART CATH AND CORONARY ANGIOGRAPHY N/A 05/31/2020   Procedure: LEFT HEART CATH AND CORONARY ANGIOGRAPHY;  Surgeon: Troy Sine, MD;  Location: [facility];  Service: Cardiovascular;  Laterality: N/A;   MULTIPLE TOOTH EXTRACTIONS  2015   OVARIAN CYST REMOVAL Right 2004 approx   SHOULDER OPEN ROTATOR CUFF REPAIR Left 04/20/2019   Procedure: ROTATOR CUFF REPAIR SHOULDER OPEN;  Surgeon: Carole Civil, MD;  Location: [facility];  Service: Orthopedics;  Laterality: Left;   TONSILLECTOMY  12/30/2004   TOTAL ABDOMINAL HYSTERECTOMY W/ BILATERAL SALPINGOOPHORECTOMY  05/16/2004   TRANSURETHRAL RESECTION  OF BLADDER TUMOR N/A 10/03/2015   Procedure: TRANSURETHRAL RESECTION OF BLADDER TUMOR (TURBT);  Surgeon: Irine Seal, MD;  Location: [facility];  Service: Urology;  Laterality: N/A;   TRANSURETHRAL RESECTION OF BLADDER TUMOR N/A 10/31/2015   Procedure: RE-STAGINGG TRANSURETHRAL RESECTION OF BLADDER TUMOR (TURBT);  Surgeon: Irine Seal, MD;  Location: [facility];  Service: Urology;  Laterality: N/A;   TRANSURETHRAL RESECTION OF BLADDER TUMOR N/A 08/17/2016   Procedure: TRANSURETHRAL RESECTION OF BLADDER TUMOR (TURBT);  Surgeon: Cleon Gustin, MD;  Location: [facility];  Service: Urology;  Laterality: N/A;     There were no vitals filed for this visit.   Subjective Assessment - 12/10/20 1559     Subjective Pt notes overall improvement and has been able to wear shoes but this remains uncomfortable.  Pt has appointment with specialist for diabetic shoes and insoles    Pertinent History DM    Currently in Pain? Yes    Pain Score 7     Pain Location Toe (Comment which one)   3-5 digits left foot   Pain Orientation Left    Pain Descriptors / Indicators Sore;Burning    Pain Type Chronic pain    Pain Onset More than a month ago                               [facility] Adult PT Treatment/Exercise - 12/10/20 0001       Ambulation/Gait   Ambulation/Gait Yes    Ambulation/Gait Assistance 7: Independent    Ambulation Distance (Feet) 372 Feet    Assistive device None    Gait Pattern Antalgic    Ambulation Surface Level;Indoor    Gait velocity - backwards 2x77f    Gait Comments 2MWT. Exhibits some external rotation LLE to accomodate 3-5 toes.      Ankle Exercises: Standing   Heel Raises 10 reps    Toe Raise 10 reps    Other Standing Ankle Exercises tandem stance 2x 30"    Other Standing Ankle Exercises 30 sec/20 sec      Ankle Exercises: Seated   Towel Crunch 2 reps    Heel Raises 15 reps    Toe Raise 15 reps    Other Seated Ankle Exercises toe extension/flexion 2 minutes    Other Seated Ankle Exercises inversion eversion - 2 minutes                       PT Short Term Goals - 12/03/20 0929       PT SHORT TERM GOAL #1   Title Patient will be independent with HEP in order to improve functional outcomes.    Baseline performs exercises daily    Time 3    Period Weeks    Status Achieved    Target Date 12/03/20      PT SHORT TERM GOAL #2   Title Patient will report at least 25% improvement in symptoms for improved quality of life.    Baseline 30% better    Time 3    Period Weeks    Status Achieved    Target Date 12/03/20      PT SHORT TERM GOAL  #3   Title Demo improved gait speed/tolerance as evidenced by 275 ft 2MWT    Baseline 336 ft with antalgic pattern    Time 3    Period Weeks    Status Achieved  Target Date 12/03/20               PT Long Term Goals - 12/10/20 1622       PT LONG TERM GOAL #1   Title Demo improved balance and WBing tolerance as evidenced by 15 sec single leg stance    Baseline 30 sec RLE, 22 sec LLE    Time 6    Period Weeks    Status Achieved      PT LONG TERM GOAL #2   Title Patient will report at least 50% improvement in symptoms for improved quality of life.    Baseline 50%    Time 4    Period Weeks    Status Achieved                   Plan - 12/10/20 1623     Clinical Impression Statement Pt reports overall improvement and main issue being lack of toe extension for her 3-5 digits left foot.  Trace contraction appreciated with toe extension of these digits. Pt notes she is not really functionally limited at this time and chief complaint is continued toe pain and lack of contraction of these toes.  Demonstrates independence and compliance with HEP for ankle/foot strengthening and ROM.  Pt would like to D/C to HEP at this time as she realizes this recovery will take time and she feels confident in continuing activities at home on her own    Personal Factors and Comorbidities Comorbidity 1;Time since onset of injury/illness/exacerbation    Comorbidities DM    Examination-Activity Limitations Locomotion Level;Transfers;Stairs;Stand;Carry;Reach Overhead    Examination-Participation Restrictions Cleaning;Community Activity;Yard Work;Shop;Meal Prep    Stability/Clinical Decision Making Stable/Uncomplicated    Rehab Potential Good    PT Frequency 1x / week    PT Duration 6 weeks    PT Treatment/Interventions ADLs/Self Care Home Management;Electrical Stimulation;DME Instruction;Moist Heat;Gait training;Functional mobility training;Therapeutic activities;Therapeutic exercise;Balance  training;Neuromuscular re-education;Patient/family education;Passive range of motion;Energy conservation;Splinting;Taping;Spinal Manipulations;Joint Manipulations    PT Next Visit Plan progress stength, ROM and balance.  Continue static balance challenges and progress to dynamic.  Follow up on footwear.    PT Home Exercise Plan towel toes, desensitization, toe spread; 11/17: towel curl, heel/toe raises, toe spread, tandem stance and SLS by counter    Consulted and Agree with Plan of Care Patient             Patient will benefit from skilled therapeutic intervention in order to improve the following deficits and impairments:  Abnormal gait, Decreased activity tolerance, Decreased balance, Decreased mobility, Decreased range of motion, Decreased strength, Increased edema, Difficulty walking, Pain  Visit Diagnosis: Pain in left foot  Other abnormalities of gait and mobility  Difficulty in walking, not elsewhere classified  Unsteadiness on feet     Problem List Patient Active Problem List   Diagnosis Date Noted   Rectal bleeding 05/17/2020   Gastroesophageal reflux disease 05/17/2020   Dysphagia 05/17/2020   Essential hypertension, benign 05/15/2019   S/P left rotator cuff repair 04/20/19  04/27/2019   Complete tear of left rotator cuff    Uncontrolled type 2 diabetes mellitus with hyperglycemia (HCC) 10/10/2018   Mixed hyperlipidemia 10/10/2018   Sacroiliac joint pain 02/23/2017   Chronic low back pain 02/09/2017   Somatic dysfunction of left sacroiliac joint 02/09/2017   GAD (generalized anxiety disorder) 12/17/2016   MDD (major depressive disorder), recurrent episode, moderate ([place]) 12/17/2016   Attention deficit hyperactivity disorder (ADHD) 12/17/2016   Postoperative anemia due to  acute blood loss 11/04/2015   Bladder cancer ([place]) 10/31/2015   CARPAL TUNNEL SYNDROME 10/02/2009    Toniann Fail, PT 12/10/2020, 4:31 PM  [facility] [address] [place], [place], [address] Phone: [phone]   Fax:  [phone]  Name: SHANTARA GOOSBY MRN: [id number] Date of Birth: 28-Sep-1975

## 2020-12-11 ENCOUNTER — Other Ambulatory Visit: Payer: Self-pay | Admitting: Podiatry

## 2020-12-11 DIAGNOSIS — I25119 Atherosclerotic heart disease of native coronary artery with unspecified angina pectoris: Secondary | ICD-10-CM

## 2020-12-11 DIAGNOSIS — I10 Essential (primary) hypertension: Secondary | ICD-10-CM

## 2020-12-11 DIAGNOSIS — E7801 Familial hypercholesterolemia: Secondary | ICD-10-CM

## 2020-12-11 NOTE — Telephone Encounter (Signed)
Medication has been refused

## 2020-12-11 NOTE — Telephone Encounter (Signed)
Medication refused

## 2020-12-11 NOTE — Telephone Encounter (Signed)
medication has been refused,refill not appropriate

## 2020-12-12 ENCOUNTER — Telehealth: Payer: Self-pay | Admitting: Podiatry

## 2020-12-12 NOTE — Telephone Encounter (Signed)
It would be good to update her lipids.  She also has f/u with Dr. Debara Pickett soon. Arrange fasting CMET, Lipids prior to next OV, if possible. She has a lot of chest pain related to diffuse coronary artery disease that is managed medically.  If her symptom are worse or more severe, she should go to the ED. Richardson Dopp, PA-C    12/12/2020 10:13 AM

## 2020-12-12 NOTE — Telephone Encounter (Signed)
Patient stated that Sandra [name] PT stated that she didn't need Physical Therapy. Patient also sent in a medication refill on her pain medication. Also patient wanted to know when she should come back for a follow up.   Please adivse.

## 2020-12-12 NOTE — Telephone Encounter (Signed)
Called pt to review Provider recommendations.  Pt reports living over an hour away from our office.  Pt will have fasting labs drawn at a [facility] in [place], [place], orders released.  I advised pt to fast 8-12 hours prior to having labs drawn.  Pt expresses that CP feels about the same.  I advised ED visit if CP increases. Pt reports often feeling down and not wanting to get out of bed.  I advised pt to f/u with PCP.  Pt reports she has not checked BP, does not have a functioning cuff.  Pt has medicaid and has not previously received a cuff from our office.  I will leave a BP cuff at the front desk for pt to pick up at next [place].  All questions answered no further questions or concerns.

## 2020-12-13 MED ORDER — HYDROCODONE-ACETAMINOPHEN 5-325 MG PO TABS: 1.0000 | ORAL_TABLET | Freq: Three times a day (TID) | ORAL | 0 refills | Status: DC | PRN

## 2020-12-13 NOTE — Telephone Encounter (Signed)
Yes you can schedule her for diabetic shoes whenever. If physical therapy discharged her, then there's not much to do. Just continue walking in regular shoes and sneakers. Thanks, Dr. Amalia Hailey

## 2020-12-13 NOTE — Telephone Encounter (Signed)
6 week follow up. Refill sent. Every 8 hours now... we need to start weaning her down from the pain meds. - Dr. Amalia Hailey

## 2020-12-13 NOTE — Telephone Encounter (Signed)
I spoke with patient. She reports increasing pain in her chest. It is sharp and goes to her back. Associated with shortness of breath. I advised patient to go to ED for evaluation.

## 2020-12-17 ENCOUNTER — Ambulatory Visit (HOSPITAL_COMMUNITY): Payer: Medicaid Other

## 2020-12-19 DIAGNOSIS — I2510 Atherosclerotic heart disease of native coronary artery without angina pectoris: Secondary | ICD-10-CM | POA: Insufficient documentation

## 2020-12-19 DIAGNOSIS — E7801 Familial hypercholesterolemia: Secondary | ICD-10-CM | POA: Insufficient documentation

## 2020-12-19 LAB — COMPREHENSIVE METABOLIC PANEL
ALT: 21 IU/L (ref 0–32)
AST: 16 IU/L (ref 0–40)
Albumin/Globulin Ratio: 1.6 (ref 1.2–2.2)
Albumin: 4.1 g/dL (ref 3.8–4.8)
Alkaline Phosphatase: 133 IU/L — ABNORMAL HIGH (ref 44–121)
BUN/Creatinine Ratio: 15 (ref 9–23)
BUN: 11 mg/dL (ref 6–24)
Bilirubin Total: 0.2 mg/dL (ref 0.0–1.2)
CO2: 25 mmol/L (ref 20–29)
Calcium: 9.4 mg/dL (ref 8.7–10.2)
Chloride: 101 mmol/L (ref 96–106)
Creatinine, Ser: 0.72 mg/dL (ref 0.57–1.00)
Globulin, Total: 2.5 g/dL (ref 1.5–4.5)
Glucose: 126 mg/dL — ABNORMAL HIGH (ref 70–99)
Potassium: 5.4 mmol/L — ABNORMAL HIGH (ref 3.5–5.2)
Sodium: 139 mmol/L (ref 134–144)
Total Protein: 6.6 g/dL (ref 6.0–8.5)
eGFR: 104 mL/min/{1.73_m2} (ref >59)

## 2020-12-19 LAB — NMR, LIPOPROFILE
Cholesterol, Total: 167 mg/dL (ref 100–199)
HDL Particle Number: 30.3 umol/L — ABNORMAL LOW (ref >=30.5)
HDL-C: 39 mg/dL — ABNORMAL LOW (ref >39)
LDL Particle Number: 1216 nmol/L — ABNORMAL HIGH (ref <1000)
LDL Size: 20.6 nm (ref >20.5)
LDL-C (NIH Calc): 99 mg/dL (ref 0–99)
LP-IR Score: 80 — ABNORMAL HIGH (ref <=45)
Small LDL Particle Number: 619 nmol/L — ABNORMAL HIGH (ref <=527)
Triglycerides: 165 mg/dL — ABNORMAL HIGH (ref 0–149)

## 2020-12-19 NOTE — Progress Notes (Signed)
Cardiology Office Note:    Date:  12/20/2020   ID:  Sandra [name], DOB [birth year], MRN [id number]  PCP:  Coolidge Breeze, [place] Providers Cardiologist:  Jenkins Rouge, MD     Referring MD: Coolidge Breeze, FNP   Chief Complaint:  F/u for CAD    Patient Profile:   Sandra [name] is a 45 y.o. female with:  Coronary artery disease  Cath 5/22:  diffuse small vessel disease (diabetic); p-mRCA 70; occluded PDA w/ L-R collats; OM2 60 >> med Rx Myoview 6/22 ([facility]): no ischemia, EF 34 Myoview 2/22: no ischemia, EF 45, ?inf infarct  Echocardiogram 3/22: inf HK-AK, EF 50-55 Unable to get cMRI due to interstim implant  Hypertension  Hyperlipidemia  Diabetes mellitus  ADHD Bipolar d/o Bladder CA S/p interstim implant  GERD  Husband died due to COVID-19 in 2022/09/23 Allergic to ACEi (urticaria with benazepril)   History of Present Illness: Ms. Sandra [name]  has small vessel diabetic disease with mod prox to mid RCA, OM2 disease and a chronically occluded PDA and L-R collaterals.  Her chest pain is managed medically.  she was last seen via Telemedicine in 9/22.        She returns for f/u.  She is here alone.  She continues to have exertional angina.  She notes assoc shortness of breath.  Her symptoms are stable without significant change. She has not had syncope, orthopnea.  She has some L foot edema related to her recent ankle fracture.     ASSESSMENT & PLAN:   Familial hypercholesterolemia LDL on recent labs did improve to 99.  She has follow-up with Dr. Debara [name] next week.  She currently remains on rosuvastatin 40 mg daily, fenofibrate 145 mg daily, evolocumab 140 mg every 14 days.    CAD (coronary artery disease) Cardiac catheterization in 5/22 with diffuse small vessel diabetic disease.  She has moderate disease in the proximal to mid RCA and occluded PDA with left-to-right collaterals as well as moderate disease in OM 2.  She has been managed medically.  She has fairly stable  anginal symptoms.  However, she does have CCS class II-III symptoms.  I have recommended that we continue to titrate her medications further.  Increase metoprolol succinate to 100 mg daily.  Continue amlodipine 5 mg daily, isosorbide mononitrate 60 mg daily, rosuvastatin 40 mg daily, aspirin 81 mg daily.  Follow-up with Dr. Johnsie [name] in 3 to 4 months in [place].  Essential hypertension, benign Blood pressure is well controlled on her current medical regimen which includes amlodipine, clonidine, isosorbide mononitrate, metoprolol succinate.  Hyperkalemia Noted on recent blood work.  Repeat BMET today.  Dilated cardiomyopathy ([place]) EF 43 by echocardiogram at [facility] in June 2022.  She is NYHA II.  Volume status currently stable.  Adjust metoprolol succinate to 100 mg daily as outlined above for better antianginal control.  Continue isosorbide mononitrate 60 mg daily.  Once angina is better controlled, consider adding ACE/ARB.           Dispo:  Return in about 3 months (around 03/20/2021) for Routine follow up in 3 months with Dr. Johnsie [name]..    Prior CV studies: Myoview 06/21/20 ([facility]) No evidence of inducible myocardial ischemia or myocardial infarction. LVEF 34%.    Echocardiogram 06/19/20 ([facility]) EF 43, mild LVH, mild RV systolic dysfunction, mild MR, mild TR, trivial TR   LEFT HEART CATH AND CORONARY ANGIOGRAPHY 05/31/2020 RCA proximal 30, mid 70, distal 50; RPDA 100 LCx proximal  28, mid 18; OM1 30, OM2 60 LAD proximal 20, mid 85 RI 30 you to thank you    Echocardiogram 03/27/20 Severe inferior HK/AK, EF 50-55, mild LVH, normal RVSF, trivial MR   NM Myocar Multi W/Spect W/Wall Motion / EF 03/07/2020 No large ischemia, EF 45, low risk   Past Medical History:  Diagnosis Date   ADHD (attention deficit hyperactivity disorder)    Allergic rhinitis    Anemia    hx of   Bipolar 1 disorder (HCC)    Bladder tumor    Cancer ([place])    bladder   Dilated cardiomyopathy ([place]) 12/20/2020   Echo  6/22 ([facility]): EF 43, mild MR, mild TR // Echo 3/22: EF 50-55   Dizziness    Full dentures    GERD (gastroesophageal reflux disease)    Gross hematuria    Hyperlipidemia    Hypertension    Type 2 diabetes mellitus (HCC)    Urgency of urination    dysuria, sui   Wears glasses    Current Medications: Current Meds  Medication Sig   Accu-Chek Softclix Lancets lancets Use as instructed to monitor glucose 4 times daily   amLODipine (NORVASC) 5 MG tablet Take 1 tablet (5 mg total) by mouth daily.   aspirin EC 81 MG tablet Take 1 tablet (81 mg total) by mouth daily. Swallow whole.   Blood Glucose Monitoring Suppl (ACCU-CHEK GUIDE) w/Device KIT 1 Piece by Does not apply route as directed.   buPROPion (WELLBUTRIN XL) 300 MG 24 hr tablet Take 300 mg by mouth every morning.   cloNIDine HCl (KAPVAY) 0.1 MG TB12 ER tablet Take 0.1 mg by mouth every evening.   Continuous Blood Gluc Sensor (DEXCOM G6 SENSOR) MISC Apply new sensor every 10 days as directed   Continuous Blood Gluc Transmit (DEXCOM G6 TRANSMITTER) MISC 1 Device by Does not apply route every 3 (three) months.   diclofenac (VOLTAREN) 75 MG EC tablet Take 75 mg by mouth 2 (two) times daily.   Evolocumab (REPATHA SURECLICK) 850 MG/ML SOAJ Inject 1 Dose into the skin every 14 (fourteen) days.   fenofibrate (TRICOR) 145 MG tablet Take 145 mg by mouth daily.   FLUoxetine (PROZAC) 20 MG capsule Take 20 mg by mouth daily.   FLUoxetine (PROZAC) 40 MG capsule Take 40 mg by mouth daily.   gabapentin (NEURONTIN) 300 MG capsule TAKE 1 CAPSULE(300 MG) BY MOUTH THREE TIMES DAILY   glipiZIDE (GLUCOTROL XL) 5 MG 24 hr tablet Take 1 tablet (5 mg total) by mouth daily with breakfast.   glucose blood (ACCU-CHEK GUIDE) test strip Use as instructed to monitor glucose 4 times daily, before meals and before bed.   HYDROcodone-acetaminophen (NORCO) 5-325 MG tablet Take 1 tablet by mouth every 8 (eight) hours as needed for moderate pain.   hydrocortisone  (ANUSOL-HC) 2.5 % rectal cream Place 1 application rectally 2 (two) times daily.   insulin aspart (NOVOLOG) 100 UNIT/ML FlexPen Inject 10-16 Units into the skin 3 (three) times daily with meals.   insulin glargine (LANTUS) 100 UNIT/ML Solostar Pen Inject 30 Units into the skin at bedtime.   Insulin Pen Needle (PEN NEEDLES) 33G X 4 MM MISC 1 each by Does not apply route in the morning, at noon, in the evening, and at bedtime.   isosorbide mononitrate (IMDUR) 60 MG 24 hr tablet Take 1 tablet (60 mg total) by mouth daily.   meloxicam (MOBIC) 7.5 MG tablet Take 7.5 mg by mouth daily as needed for pain.  metFORMIN (GLUCOPHAGE) 1000 MG tablet Take 1 tablet (1,000 mg total) by mouth 2 (two) times daily with a meal.   montelukast (SINGULAIR) 10 MG tablet Take 10 mg by mouth at bedtime.   pantoprazole (PROTONIX) 40 MG tablet Take 1 tablet (40 mg total) by mouth 2 (two) times daily before a meal.   rosuvastatin (CRESTOR) 40 MG tablet Take 40 mg by mouth daily.   VYVANSE 40 MG capsule Take 40 mg by mouth every morning.   [DISCONTINUED] metoprolol succinate (TOPROL-XL) 50 MG 24 hr tablet Take 1 tablet (50 mg total) by mouth daily. Take with or immediately following a meal.   [DISCONTINUED] nitroGLYCERIN (NITROSTAT) 0.4 MG SL tablet Place 1 tablet (0.4 mg total) under the tongue every 5 (five) minutes as needed for chest pain.   Current Facility-Administered Medications for the 12/20/20 encounter (Office Visit) with Richardson Dopp T, PA-C  Medication   betamethasone acetate-betamethasone sodium phosphate (CELESTONE) injection 3 mg    Allergies:   Ibuprofen, Benazepril, Diphenhydramine, Metronidazole, Oxycodone, Adhesive [tape], Codeine, Loratadine, Nystatin-triamcinolone, and Penicillins   Social History   Tobacco Use   Smoking status: Former    Packs/day: 0.50    Years: 10.00    Pack years: 5.00    Types: Cigarettes    Quit date: 11/12/2019    Years since quitting: 1.1   Smokeless tobacco: Never   Vaping Use   Vaping Use: Never used  Substance Use Topics   Alcohol use: No   Drug use: No    Family Hx: The patient's family history includes Breast cancer in her maternal aunt; Cancer in her mother and another family member; Diabetes in her daughter; Hypertension in her mother; Lung cancer in her maternal uncle. There is no history of Colon cancer.  ROS see HPI  EKGs/Labs/Other Test Reviewed:    EKG:  EKG is  ordered today.  The ekg ordered today demonstrates NSR, HR 74, normal axis, no ST-T wave changes, QTC 475  Recent Labs: 05/29/2020: Hemoglobin 14.3; Platelets 383 12/18/2020: ALT 21; BUN 11; Creatinine, Ser 0.72; Potassium 5.4; Sodium 139   Recent Lipid Panel Lab Results  Component Value Date/Time   CHOL 246 (H) 06/05/2020 08:53 AM   TRIG 273 (H) 06/05/2020 08:53 AM   HDL 31 (L) 06/05/2020 08:53 AM   LDLCALC 163 (H) 06/05/2020 08:53 AM     Risk Assessment/Calculations:          Physical Exam:    VS:  BP 130/80   Pulse 80   Ht 5' (1.524 m)   Wt 174 lb (78.9 kg)   SpO2 98%   BMI 33.98 kg/m     Wt Readings from Last 3 Encounters:  12/20/20 174 lb (78.9 kg)  10/24/20 167 lb 3.2 oz (75.8 kg)  10/01/20 159 lb (72.1 kg)    Constitutional:      Appearance: Healthy appearance. Not in distress.  Eyes:     Comments: +Xanthelasma   Neck:     Vascular: No JVR. JVD normal.  Pulmonary:     Effort: Pulmonary effort is normal.     Breath sounds: No wheezing. No rales.  Cardiovascular:     Normal rate. Regular rhythm. Normal S1. Normal S2.      Murmurs: There is no murmur.  Edema:    Peripheral edema absent.  Abdominal:     Palpations: Abdomen is soft.  Skin:    General: Skin is warm and dry.  Neurological:     Mental Status: Alert and  oriented to person, place and time.     Cranial Nerves: Cranial nerves are intact.       Medication Adjustments/Labs and Tests Ordered: Current medicines are reviewed at length with the patient today.  Concerns regarding  medicines are outlined above.  Tests Ordered: Orders Placed This Encounter  Procedures   Basic metabolic panel   EKG [id number]   Medication Changes: Meds ordered this encounter  Medications   metoprolol succinate (TOPROL-XL) 100 MG 24 hr tablet    Sig: Take 1 tablet (100 mg total) by mouth daily. Take with or immediately following a meal.    Dispense:  90 tablet    Refill:  3   nitroGLYCERIN (NITROSTAT) 0.4 MG SL tablet    Sig: Place 1 tablet (0.4 mg total) under the tongue every 5 (five) minutes as needed for chest pain.    Dispense:  25 tablet    Refill:  11    Order Specific Question:   Supervising Provider    Answer:   Lelon Perla [[id number]]   aspirin EC 81 MG tablet    Sig: Take 1 tablet (81 mg total) by mouth daily. Swallow whole.    Dispense:  30 tablet    Refill:  11    Order Specific Question:   Supervising Provider    Answer:   Lelon Perla [[id number]]   Signed, Richardson Dopp, PA-C  12/20/2020 12:25 PM    [facility] [place], [place], [place]  [address] Phone: [phone]; Fax: [phone]

## 2020-12-20 ENCOUNTER — Other Ambulatory Visit: Payer: Self-pay | Admitting: Podiatry

## 2020-12-20 ENCOUNTER — Encounter: Payer: Self-pay | Admitting: Physician Assistant

## 2020-12-20 ENCOUNTER — Encounter: Payer: Self-pay | Admitting: Radiology

## 2020-12-20 ENCOUNTER — Ambulatory Visit (INDEPENDENT_AMBULATORY_CARE_PROVIDER_SITE_OTHER): Payer: Medicaid Other | Admitting: Physician Assistant

## 2020-12-20 ENCOUNTER — Other Ambulatory Visit: Payer: Self-pay

## 2020-12-20 VITALS — BP 130/80 | HR 80 | Ht 60.0 in | Wt 174.0 lb

## 2020-12-20 DIAGNOSIS — E1165 Type 2 diabetes mellitus with hyperglycemia: Secondary | ICD-10-CM

## 2020-12-20 DIAGNOSIS — E7801 Familial hypercholesterolemia: Secondary | ICD-10-CM

## 2020-12-20 DIAGNOSIS — I25119 Atherosclerotic heart disease of native coronary artery with unspecified angina pectoris: Secondary | ICD-10-CM

## 2020-12-20 DIAGNOSIS — E875 Hyperkalemia: Secondary | ICD-10-CM

## 2020-12-20 DIAGNOSIS — I10 Essential (primary) hypertension: Secondary | ICD-10-CM | POA: Diagnosis not present

## 2020-12-20 DIAGNOSIS — I420 Dilated cardiomyopathy: Secondary | ICD-10-CM

## 2020-12-20 HISTORY — DX: Dilated cardiomyopathy: I42.0

## 2020-12-20 LAB — BASIC METABOLIC PANEL
BUN/Creatinine Ratio: 14 (ref 9–23)
BUN: 10 mg/dL (ref 6–24)
CO2: 25 mmol/L (ref 20–29)
Calcium: 9.5 mg/dL (ref 8.7–10.2)
Chloride: 98 mmol/L (ref 96–106)
Creatinine, Ser: 0.73 mg/dL (ref 0.57–1.00)
Glucose: 127 mg/dL — ABNORMAL HIGH (ref 70–99)
Potassium: 5 mmol/L (ref 3.5–5.2)
Sodium: 136 mmol/L (ref 134–144)
eGFR: 102 mL/min/{1.73_m2} (ref >59)

## 2020-12-20 MED ORDER — ASPIRIN EC 81 MG PO TBEC: 81.0000 mg | DELAYED_RELEASE_TABLET | Freq: Every day | ORAL | 11 refills | Status: DC

## 2020-12-20 MED ORDER — METOPROLOL SUCCINATE ER 100 MG PO TB24: 100.0000 mg | ORAL_TABLET | Freq: Every day | ORAL | 3 refills | Status: DC

## 2020-12-20 MED ORDER — NITROGLYCERIN 0.4 MG SL SUBL: 0.4000 mg | SUBLINGUAL_TABLET | SUBLINGUAL | 11 refills | Status: DC | PRN

## 2020-12-20 NOTE — Assessment & Plan Note (Signed)
EF 43 by echocardiogram at [facility] in June 2022.  She is NYHA II.  Volume status currently stable.  Adjust metoprolol succinate to 100 mg daily as outlined above for better antianginal control.  Continue isosorbide mononitrate 60 mg daily.  Once angina is better controlled, consider adding ACE/ARB.

## 2020-12-20 NOTE — Assessment & Plan Note (Signed)
Noted on recent blood work.  Repeat BMET today.

## 2020-12-20 NOTE — Patient Instructions (Signed)
  Medication Instructions:   INCREASE Toprol XL one (1) tablet by mouth ( 100 mg) daily.    *If you need a refill on your cardiac medications before your next appointment, please call your pharmacy*   Lab Work:  TODAY!!!! BMET   If you have labs (blood work) drawn today and your tests are completely normal, you will receive your results only by: [place] (if you have MyChart) OR A paper copy in the mail If you have any lab test that is abnormal or we need to change your treatment, we will call you to review the results.   Testing/Procedures:  -NONE   Follow-Up: At [facility], you and your health needs are our priority.  As part of our continuing mission to provide you with exceptional heart care, we have created designated Provider Care Teams.  These Care Teams include your primary Cardiologist (physician) and Advanced Practice Providers (APPs -  Physician Assistants and Nurse Practitioners) who all work together to provide you with the care you need, when you need it.  We recommend signing up for the patient portal called "MyChart".  Sign up information is provided on this After Visit Summary.  MyChart is used to connect with patients for Virtual Visits (Telemedicine).  Patients are able to view lab/test results, encounter notes, upcoming appointments, etc.  Non-urgent messages can be sent to your provider as well.   To learn more about what you can do with MyChart, go to [email or web address].    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  This makes your cholesterol high.

## 2020-12-20 NOTE — Assessment & Plan Note (Signed)
Cardiac catheterization in 5/22 with diffuse small vessel diabetic disease.  She has moderate disease in the proximal to mid RCA and occluded PDA with left-to-right collaterals as well as moderate disease in OM 2.  She has been managed medically.  She has fairly stable anginal symptoms.  However, she does have CCS class II-III symptoms.  I have recommended that we continue to titrate her medications further.  Increase metoprolol succinate to 100 mg daily.  Continue amlodipine 5 mg daily, isosorbide mononitrate 60 mg daily, rosuvastatin 40 mg daily, aspirin 81 mg daily.  Follow-up with Dr. Johnsie Cancel in 3 to 4 months in [place].

## 2020-12-20 NOTE — Assessment & Plan Note (Signed)
Blood pressure is well controlled on her current medical regimen which includes amlodipine, clonidine, isosorbide mononitrate, metoprolol succinate.

## 2020-12-20 NOTE — Assessment & Plan Note (Signed)
LDL on recent labs did improve to 99.  She has follow-up with Dr. Debara Pickett next week.  She currently remains on rosuvastatin 40 mg daily, fenofibrate 145 mg daily, evolocumab 140 mg every 14 days.

## 2020-12-23 ENCOUNTER — Other Ambulatory Visit: Payer: Self-pay | Admitting: Podiatry

## 2020-12-23 ENCOUNTER — Other Ambulatory Visit: Payer: Self-pay

## 2020-12-23 ENCOUNTER — Encounter: Payer: Self-pay | Admitting: Internal Medicine

## 2020-12-23 ENCOUNTER — Telehealth (INDEPENDENT_AMBULATORY_CARE_PROVIDER_SITE_OTHER): Payer: Medicaid Other | Admitting: Internal Medicine

## 2020-12-23 DIAGNOSIS — I2510 Atherosclerotic heart disease of native coronary artery without angina pectoris: Secondary | ICD-10-CM

## 2020-12-23 DIAGNOSIS — E7801 Familial hypercholesterolemia: Secondary | ICD-10-CM | POA: Diagnosis not present

## 2020-12-23 NOTE — Progress Notes (Signed)
Virtual Visit via Telephone Note   This visit type was conducted due to national recommendations for restrictions regarding the COVID-19 Pandemic (e.g. social distancing) in an effort to limit this patient's exposure and mitigate transmission in our community.  Due to her co-morbid illnesses, this patient is at least at moderate risk for complications without adequate follow up.  This format is felt to be most appropriate for this patient at this time.  All issues noted in this document were discussed and addressed.  No physical exam was performed with this format.  Please refer to the patient's chart for her consent to telehealth for [facility].      Date:  12/23/2020   ID:  Sandra [name], DOB 12-24-1975, MRN [id number] The patient was identified using 2 identifiers.  Evaluation Performed:  Follow-Up Visit  Patient Location:  [place] [id number]  Provider location:   [address], [address] [place], [place] [address]  PCP:  Coolidge Breeze, FNP  Cardiologist:  Jenkins Rouge, MD Electrophysiologist:  None   Chief Complaint:  Manage dyslipidemia  History of Present Illness:    Sandra [name] is a 45 y.o. female who presents via audio/video conferencing for a telehealth visit today.  This is a pleasant 45 year old female with a history of bipolar 1 and ADHD, hypertension, dyslipidemia and type 2 diabetes on insulin.  Unfortunately she recently had recurrent chest pain symptoms and underwent heart catheterization in May 2022, which demonstrated multivessel coronary disease with diffuse small vessels.  There was no clear significant focal stenosis for PCI.  Medical therapy was recommended.  She does report family history of early onset heart disease in her father.  He also had high cholesterol.  She currently is on rosuvastatin 40 mg which she has been taking for "years".  She also takes fenofibrate.  Recent lipids demonstrated total cholesterol 202, direct LDL 146,  HDL 31 and triglyceride 168.  Her LDL then without high potency statin will be well over 190 more consistent with a familial hyperlipidemia.  This would explain her very early onset heart disease in addition to her diabetes.  She reports recently her diabetes control is much better as her blood sugars had remain between 4 and 500 and now are down around 130.  12/23/2020  [name] returns today for telephone follow-up.  Overall she seems to be tolerating Repatha.  She has had less than expected improvement in her cholesterol with LDL down to 99 from 146 however there is improvement.  She reports compliance with her statin and fenofibrate.  She is injecting in her abdomen and seems to be following the correct instructions.  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  The patient does not have symptoms concerning for COVID-19 infection (fever, chills, cough, or new SHORTNESS OF BREATH).    Prior CV studies:   The following studies were reviewed today:  Chart reviewed, lab work  PMHx:  Past Medical History:  Diagnosis Date   ADHD (attention deficit hyperactivity disorder)    Allergic rhinitis    Anemia    hx of   Bipolar 1 disorder ([place])    Bladder tumor    Cancer ([place])    bladder   Dilated cardiomyopathy ([place]) 12/20/2020   Echo 6/22 ([facility]): EF 43, mild MR, mild TR // Echo 3/22: EF 50-55   Dizziness    Full dentures    GERD (gastroesophageal reflux disease)  Gross hematuria    Hyperlipidemia    Hypertension    Type 2 diabetes mellitus (HCC)    Urgency of urination    dysuria, sui   Wears glasses     Past Surgical History:  Procedure Laterality Date   CYSTOSCOPY N/A 10/03/2015   Procedure: CYSTOSCOPY;  Surgeon: Irine Seal, MD;  Location: [facility];  Service: Urology;  Laterality: N/A;   CYSTOSCOPY WITH STENT PLACEMENT Right 10/31/2015   Procedure: CYSTOSCOPY WITH ATTEMPTED RIGHT URETERAL OPENING;   Surgeon: Irine Seal, MD;  Location: [facility];  Service: Urology;  Laterality: Right;   FOOT SURGERY Left    September 2022, broke 7 bones in left foot   INTERSTIM IMPLANT PLACEMENT N/A 03/14/2018   Procedure: Barrie Lyme IMPLANT FIRST STAGE;  Surgeon: Cleon Gustin, MD;  Location: [facility];  Service: Urology;  Laterality: N/A;   INTERSTIM IMPLANT PLACEMENT N/A 03/30/2018   Procedure: Barrie Lyme IMPLANT SECOND STAGE;  Surgeon: Cleon Gustin, MD;  Location: [facility];  Service: Urology;  Laterality: N/A;   LEFT HEART CATH AND CORONARY ANGIOGRAPHY N/A 05/31/2020   Procedure: LEFT HEART CATH AND CORONARY ANGIOGRAPHY;  Surgeon: Troy Sine, MD;  Location: [facility];  Service: Cardiovascular;  Laterality: N/A;   MULTIPLE TOOTH EXTRACTIONS  2015   OVARIAN CYST REMOVAL Right 2004 approx   SHOULDER OPEN ROTATOR CUFF REPAIR Left 04/20/2019   Procedure: ROTATOR CUFF REPAIR SHOULDER OPEN;  Surgeon: Carole Civil, MD;  Location: [facility];  Service: Orthopedics;  Laterality: Left;   TONSILLECTOMY  12/30/2004   TOTAL ABDOMINAL HYSTERECTOMY W/ BILATERAL SALPINGOOPHORECTOMY  05/16/2004   TRANSURETHRAL RESECTION OF BLADDER TUMOR N/A 10/03/2015   Procedure: TRANSURETHRAL RESECTION OF BLADDER TUMOR (TURBT);  Surgeon: Irine Seal, MD;  Location: [facility];  Service: Urology;  Laterality: N/A;   TRANSURETHRAL RESECTION OF BLADDER TUMOR N/A 10/31/2015   Procedure: RE-STAGINGG TRANSURETHRAL RESECTION OF BLADDER TUMOR (TURBT);  Surgeon: Irine Seal, MD;  Location: [facility];  Service: Urology;  Laterality: N/A;   TRANSURETHRAL RESECTION OF BLADDER TUMOR N/A 08/17/2016   Procedure: TRANSURETHRAL RESECTION OF BLADDER TUMOR (TURBT);  Surgeon: Cleon Gustin, MD;  Location: [facility];  Service: Urology;  Laterality: N/A;    FAMHx:  Family History  Problem Relation Age of Onset   Hypertension Mother    Cancer Mother    Breast cancer Maternal Aunt     Lung cancer Maternal Uncle    Cancer Other    Diabetes Daughter    Colon cancer Neg Hx     SOCHx:   reports that she quit smoking about 13 months ago. Her smoking use included cigarettes. She has a 5.00 pack-year smoking history. She has never used smokeless tobacco. She reports that she does not drink alcohol and does not use drugs.  ALLERGIES:  Allergies  Allergen Reactions   Ibuprofen Hives    itching itching itching   Benazepril Hives   Diphenhydramine Hives    Other reaction(s): hives   Metronidazole Nausea And Vomiting    Other reaction(s): nausea   Oxycodone     Pt has tolerated hydromorphone in the past and takes Norco at home.   Adhesive [Tape] Rash   Codeine Nausea And Vomiting and Rash   Loratadine Rash   Nystatin-Triamcinolone Rash   Penicillins Rash    Has patient had a PCN reaction causing immediate rash, facial/tongue/throat swelling, SOB or lightheadedness with hypotension: No Has patient had a PCN reaction  causing severe rash involving mucus membranes or skin necrosis: Yes Has patient had a PCN reaction that required hospitalization: No Has patient had a PCN reaction occurring within the last 10 years: no  If all of the above answers are "NO", then may proceed with Cephalosporin use. Other reaction(s): nausea    MEDS:  Current Meds  Medication Sig   Accu-Chek Softclix Lancets lancets Use as instructed to monitor glucose 4 times daily   aspirin EC 81 MG tablet Take 1 tablet (81 mg total) by mouth daily. Swallow whole.   Blood Glucose Monitoring Suppl (ACCU-CHEK GUIDE) w/Device KIT 1 Piece by Does not apply route as directed.   buPROPion (WELLBUTRIN XL) 300 MG 24 hr tablet Take 300 mg by mouth every morning.   cloNIDine HCl (KAPVAY) 0.1 MG TB12 ER tablet Take 0.1 mg by mouth every evening.   Continuous Blood Gluc Sensor (DEXCOM G6 SENSOR) MISC Apply new sensor every 10 days as directed   Continuous Blood Gluc Transmit (DEXCOM G6 TRANSMITTER) MISC 1  Device by Does not apply route every 3 (three) months.   diclofenac (VOLTAREN) 75 MG EC tablet Take 75 mg by mouth 2 (two) times daily.   Evolocumab (REPATHA SURECLICK) 470 MG/ML SOAJ Inject 1 Dose into the skin every 14 (fourteen) days.   fenofibrate (TRICOR) 145 MG tablet Take 145 mg by mouth daily.   FLUoxetine (PROZAC) 20 MG capsule Take 20 mg by mouth daily.   FLUoxetine (PROZAC) 40 MG capsule Take 40 mg by mouth daily.   gabapentin (NEURONTIN) 300 MG capsule TAKE 1 CAPSULE(300 MG) BY MOUTH THREE TIMES DAILY   glipiZIDE (GLUCOTROL XL) 5 MG 24 hr tablet Take 1 tablet (5 mg total) by mouth daily with breakfast.   glucose blood (ACCU-CHEK GUIDE) test strip Use as instructed to monitor glucose 4 times daily, before meals and before bed.   HYDROcodone-acetaminophen (NORCO) 5-325 MG tablet Take 1 tablet by mouth every 8 (eight) hours as needed for moderate pain.   hydrocortisone (ANUSOL-HC) 2.5 % rectal cream Place 1 application rectally 2 (two) times daily.   insulin aspart (NOVOLOG) 100 UNIT/ML FlexPen Inject 10-16 Units into the skin 3 (three) times daily with meals.   insulin glargine (LANTUS) 100 UNIT/ML Solostar Pen Inject 30 Units into the skin at bedtime.   Insulin Pen Needle (PEN NEEDLES) 33G X 4 MM MISC 1 each by Does not apply route in the morning, at noon, in the evening, and at bedtime.   isosorbide mononitrate (IMDUR) 60 MG 24 hr tablet Take 1 tablet (60 mg total) by mouth daily.   meloxicam (MOBIC) 7.5 MG tablet Take 7.5 mg by mouth daily as needed for pain.   metFORMIN (GLUCOPHAGE) 1000 MG tablet Take 1 tablet (1,000 mg total) by mouth 2 (two) times daily with a meal.   metoprolol succinate (TOPROL-XL) 100 MG 24 hr tablet Take 1 tablet (100 mg total) by mouth daily. Take with or immediately following a meal.   montelukast (SINGULAIR) 10 MG tablet Take 10 mg by mouth at bedtime.   nitroGLYCERIN (NITROSTAT) 0.4 MG SL tablet Place 1 tablet (0.4 mg total) under the tongue every 5 (five)  minutes as needed for chest pain.   pantoprazole (PROTONIX) 40 MG tablet Take 1 tablet (40 mg total) by mouth 2 (two) times daily before a meal.   rosuvastatin (CRESTOR) 40 MG tablet Take 40 mg by mouth daily.   VYVANSE 40 MG capsule Take 40 mg by mouth every morning.   Current  Facility-Administered Medications for the 12/23/20 encounter (Appointment) with Pixie Casino, MD  Medication   betamethasone acetate-betamethasone sodium phosphate (CELESTONE) injection 3 mg     ROS: Pertinent items noted in HPI and remainder of comprehensive ROS otherwise negative.  Labs/Other Tests and Data Reviewed:    Recent Labs: 05/29/2020: Hemoglobin 14.3; Platelets 383 12/18/2020: ALT 21 12/20/2020: BUN 10; Creatinine, Ser 0.73; Potassium 5.0; Sodium 136   Recent Lipid Panel Lab Results  Component Value Date/Time   CHOL 246 (H) 06/05/2020 08:53 AM   TRIG 273 (H) 06/05/2020 08:53 AM   HDL 31 (L) 06/05/2020 08:53 AM   CHOLHDL 7.9 (H) 06/05/2020 08:53 AM   LDLCALC 163 (H) 06/05/2020 08:53 AM    Wt Readings from Last 3 Encounters:  12/20/20 174 lb (78.9 kg)  10/24/20 167 lb 3.2 oz (75.8 kg)  10/01/20 159 lb (72.1 kg)     Exam:    Vital Signs:  There were no vitals taken for this visit.   Exam not performed due to telephone visit  ASSESSMENT & PLAN:    Probable FH with xanthelasmas Family history of premature coronary disease Multivessel coronary artery disease, medical therapy recommended (05/2020) Type 2 diabetes on insulin-A1c 11.2 Hypertension Obesity  Ms. Kuennen has had somewhat of a suboptimal response to Repatha however LDL has come down to 99 with particle #[id number], this is in combination with high intensity rosuvastatin and fenofibrate.  I would recommend continuing her current therapies.  We will repeat a lipid NMR and LP(a) in 6 months.  This could be a reason why her cholesterol has not responded appropriately to therapies, however it is lower.  COVID-19 Education: The signs  and symptoms of COVID-19 were discussed with the patient and how to seek care for testing (follow up with PCP or arrange E-visit).  The importance of social distancing was discussed today.  Patient Risk:   After full review of this patients clinical status, I feel that they are at least moderate risk at this time.  Time:   Today, I have spent 15 minutes with the patient with telehealth technology discussing dyslipidemia.     Medication Adjustments/Labs and Tests Ordered: Current medicines are reviewed at length with the patient today.  Concerns regarding medicines are outlined above.   Tests Ordered: No orders of the defined types were placed in this encounter.   Medication Changes: No orders of the defined types were placed in this encounter.   Disposition:  in 6 month(s)  Pixie Casino, MD, [facility], [place] Director of the Advanced Lipid Disorders &  Cardiovascular Risk Reduction Clinic Diplomate of the American Board of Clinical Lipidology Attending Cardiologist  Direct Dial: [phone]  Fax: [phone]  Website:  [email or web address]  Pixie Casino, MD  12/23/2020 9:29 AM

## 2020-12-23 NOTE — Telephone Encounter (Signed)
Please advise 

## 2020-12-23 NOTE — Patient Instructions (Signed)
Medication Instructions:  Your physician recommends that you continue on your current medications as directed. Please refer to the Current Medication list given to you today.  *If you need a refill on your cardiac medications before your next appointment, please call your pharmacy*   Lab Work: FASTING lab work to check cholesterol in 6 months -- complete about 1 week before next visit  If you have labs (blood work) drawn today and your tests are completely normal, you will receive your results only by: [place] (if you have MyChart) OR A paper copy in the mail If you have any lab test that is abnormal or we need to change your treatment, we will call you to review the results.   Testing/Procedures: NONE   Follow-Up: At [facility], you and your health needs are our priority.  As part of our continuing mission to provide you with exceptional heart care, we have created designated Provider Care Teams.  These Care Teams include your primary Cardiologist (physician) and Advanced Practice Providers (APPs -  Physician Assistants and Nurse Practitioners) who all work together to provide you with the care you need, when you need it.  We recommend signing up for the patient portal called "MyChart".  Sign up information is provided on this After Visit Summary.  MyChart is used to connect with patients for Virtual Visits (Telemedicine).  Patients are able to view lab/test results, encounter notes, upcoming appointments, etc.  Non-urgent messages can be sent to your provider as well.   To learn more about what you can do with MyChart, go to [email or web address].    Your next appointment:   6 months with Dr. Debara Pickett -- lipid clinic

## 2020-12-23 NOTE — Addendum Note (Signed)
Addended by: Fidel Levy on: 12/23/2020 09:42 AM   Modules accepted: Orders

## 2020-12-24 ENCOUNTER — Other Ambulatory Visit: Payer: Self-pay | Admitting: Podiatry

## 2020-12-24 ENCOUNTER — Ambulatory Visit (HOSPITAL_COMMUNITY): Payer: Medicaid Other

## 2020-12-24 MED ORDER — HYDROCODONE-ACETAMINOPHEN 5-325 MG PO TABS: 1.0000 | ORAL_TABLET | Freq: Three times a day (TID) | ORAL | 0 refills | Status: DC | PRN

## 2020-12-24 NOTE — Telephone Encounter (Signed)
Please advise 

## 2020-12-24 NOTE — Telephone Encounter (Signed)
Refill already sent. Dr. Amalia Hailey

## 2020-12-24 NOTE — Telephone Encounter (Signed)
Refill already sent. - Dr. Amalia Hailey

## 2020-12-26 NOTE — Telephone Encounter (Signed)
Either make sure to refill from here or refuse the medication. It will stay in the request rx.

## 2020-12-26 NOTE — Telephone Encounter (Signed)
Please refill from the rx request or refill the medication. It stays in the rx request box

## 2020-12-30 ENCOUNTER — Other Ambulatory Visit: Payer: Self-pay | Admitting: Podiatry

## 2020-12-31 ENCOUNTER — Other Ambulatory Visit: Payer: Self-pay | Admitting: Podiatry

## 2020-12-31 NOTE — Telephone Encounter (Signed)
Please Advise

## 2021-01-01 ENCOUNTER — Other Ambulatory Visit: Payer: Self-pay | Admitting: Nurse Practitioner

## 2021-01-02 ENCOUNTER — Other Ambulatory Visit: Payer: Self-pay | Admitting: Podiatry

## 2021-01-03 ENCOUNTER — Other Ambulatory Visit: Payer: Self-pay | Admitting: Podiatry

## 2021-01-05 MED ORDER — HYDROCODONE-ACETAMINOPHEN 5-325 MG PO TABS: 1.0000 | ORAL_TABLET | Freq: Two times a day (BID) | ORAL | 0 refills | Status: DC | PRN

## 2021-01-13 ENCOUNTER — Other Ambulatory Visit: Payer: Self-pay | Admitting: Podiatry

## 2021-01-15 ENCOUNTER — Other Ambulatory Visit: Payer: Self-pay | Admitting: Podiatry

## 2021-01-20 ENCOUNTER — Ambulatory Visit: Payer: Medicaid Other | Admitting: Podiatry

## 2021-01-20 ENCOUNTER — Other Ambulatory Visit: Payer: Medicaid Other

## 2021-01-20 ENCOUNTER — Ambulatory Visit (INDEPENDENT_AMBULATORY_CARE_PROVIDER_SITE_OTHER): Payer: Medicaid Other

## 2021-01-20 ENCOUNTER — Other Ambulatory Visit: Payer: Self-pay

## 2021-01-20 DIAGNOSIS — S92352D Displaced fracture of fifth metatarsal bone, left foot, subsequent encounter for fracture with routine healing: Secondary | ICD-10-CM

## 2021-01-20 DIAGNOSIS — Z9889 Other specified postprocedural states: Secondary | ICD-10-CM

## 2021-01-22 DIAGNOSIS — R0789 Other chest pain: Secondary | ICD-10-CM | POA: Diagnosis not present

## 2021-01-22 DIAGNOSIS — I10 Essential (primary) hypertension: Secondary | ICD-10-CM | POA: Insufficient documentation

## 2021-01-22 DIAGNOSIS — E119 Type 2 diabetes mellitus without complications: Secondary | ICD-10-CM | POA: Insufficient documentation

## 2021-01-22 DIAGNOSIS — Z8551 Personal history of malignant neoplasm of bladder: Secondary | ICD-10-CM | POA: Insufficient documentation

## 2021-01-22 DIAGNOSIS — Z87891 Personal history of nicotine dependence: Secondary | ICD-10-CM | POA: Diagnosis not present

## 2021-01-22 DIAGNOSIS — M25562 Pain in left knee: Secondary | ICD-10-CM | POA: Insufficient documentation

## 2021-01-22 DIAGNOSIS — R112 Nausea with vomiting, unspecified: Secondary | ICD-10-CM | POA: Diagnosis not present

## 2021-01-23 ENCOUNTER — Other Ambulatory Visit: Payer: Self-pay

## 2021-01-23 ENCOUNTER — Emergency Department (HOSPITAL_COMMUNITY): Payer: Medicaid Other

## 2021-01-23 ENCOUNTER — Encounter (HOSPITAL_COMMUNITY): Payer: Self-pay | Admitting: Emergency Medicine

## 2021-01-23 ENCOUNTER — Emergency Department (HOSPITAL_COMMUNITY)
Admission: EM | Admit: 2021-01-23 | Discharge: 2021-01-23 | Disposition: A | Discharge status: Home / Self Care | Payer: Medicaid Other | Attending: Emergency Medicine | Admitting: Emergency Medicine

## 2021-01-23 DIAGNOSIS — R079 Chest pain, unspecified: Secondary | ICD-10-CM

## 2021-01-23 DIAGNOSIS — M25562 Pain in left knee: Secondary | ICD-10-CM

## 2021-01-23 LAB — COMPREHENSIVE METABOLIC PANEL
ALT: 19 U/L (ref 0–44)
AST: 18 U/L (ref 15–41)
Albumin: 3.4 g/dL — ABNORMAL LOW (ref 3.5–5.0)
Alkaline Phosphatase: 97 U/L (ref 38–126)
Anion gap: 8 (ref 5–15)
BUN: 16 mg/dL (ref 6–20)
CO2: 25 mmol/L (ref 22–32)
Calcium: 8.8 mg/dL — ABNORMAL LOW (ref 8.9–10.3)
Chloride: 101 mmol/L (ref 98–111)
Creatinine, Ser: 0.84 mg/dL (ref 0.44–1.00)
GFR, Estimated: >60 mL/min (ref >60)
Glucose, Bld: 91 mg/dL (ref 70–99)
Potassium: 3.9 mmol/L (ref 3.5–5.1)
Sodium: 134 mmol/L — ABNORMAL LOW (ref 135–145)
Total Bilirubin: 0.1 mg/dL — ABNORMAL LOW (ref 0.3–1.2)
Total Protein: 7 g/dL (ref 6.5–8.1)

## 2021-01-23 LAB — CBC
HCT: 40 % (ref 36.0–46.0)
Hemoglobin: 12.3 g/dL (ref 12.0–15.0)
MCH: 25.4 pg — ABNORMAL LOW (ref 26.0–34.0)
MCHC: 30.8 g/dL (ref 30.0–36.0)
MCV: 82.6 fL (ref 80.0–100.0)
Platelets: 425 10*3/uL — ABNORMAL HIGH (ref 150–400)
RBC: 4.84 MIL/uL (ref 3.87–5.11)
RDW: 13.7 % (ref 11.5–15.5)
WBC: 8.8 10*3/uL (ref 4.0–10.5)
nRBC: 0 % (ref 0.0–0.2)

## 2021-01-23 LAB — CBG MONITORING, ED: Glucose-Capillary: 78 mg/dL (ref 70–99)

## 2021-01-23 LAB — LIPASE, BLOOD: Lipase: 27 U/L (ref 11–51)

## 2021-01-23 LAB — PROTIME-INR
INR: 1 (ref 0.8–1.2)
Prothrombin Time: 13 seconds (ref 11.4–15.2)

## 2021-01-23 LAB — TROPONIN I (HIGH SENSITIVITY)
Troponin I (High Sensitivity): 4 ng/L (ref <18)
Troponin I (High Sensitivity): 4 ng/L (ref <18)

## 2021-01-23 MED ORDER — FENTANYL CITRATE PF 50 MCG/ML IJ SOSY
50.0000 ug | PREFILLED_SYRINGE | Freq: Once | INTRAMUSCULAR | Status: AC
  Administered 2021-01-23: 50 ug via INTRAVENOUS
  Filled 2021-01-23: qty 1

## 2021-01-23 MED ORDER — SODIUM CHLORIDE 0.9 % IV BOLUS
1000.0000 mL | Freq: Once | INTRAVENOUS | Status: AC
  Administered 2021-01-23: 1000 mL via INTRAVENOUS

## 2021-01-23 MED ORDER — ONDANSETRON HCL 4 MG/2ML IJ SOLN
4.0000 mg | Freq: Once | INTRAMUSCULAR | Status: AC
  Administered 2021-01-23: 4 mg via INTRAVENOUS
  Filled 2021-01-23: qty 2

## 2021-01-23 NOTE — ED Notes (Signed)
ED Provider at bedside. 

## 2021-01-23 NOTE — ED Notes (Signed)
Pt states her dexcom is reading low, this RN checked CBG and it read 78. Pt given juice and crackers

## 2021-01-23 NOTE — ED Triage Notes (Signed)
Pt c/o chest pain, vomiting, and left knee pain after fall today.

## 2021-01-23 NOTE — Discharge Instructions (Signed)
You were evaluated in the Emergency Department and after careful evaluation, we did not find any emergent condition requiring admission or further testing in the hospital.  Your exam/testing today was overall reassuring.  Please return to the Emergency Department if you experience any worsening of your condition.  Thank you for allowing us to be a part of your care.  

## 2021-01-23 NOTE — ED Provider Notes (Signed)
[facility] Emergency Department Provider Note MRN:  [id number]  Arrival date & time: 01/23/21     Chief Complaint   Chest Pain   History of Present Illness   Sandra [name] is a 46 y.o. year-old female with a history of cardiomyopathy, diabetes presenting to the ED with chief complaint of chest pain.  Patient lost her balance and fell and hurt her left knee.  Since the fall she has been having central chest pain, described as a tightness.  Pain for 3 or 4 hours now.  Associated with nausea and vomiting.  Denies shortness of breath, no headache, no head trauma, no loss of consciousness, no abdominal pain.  Review of Systems  A thorough review of systems was obtained and all systems are negative except as noted in the HPI and PMH.   Patient's Health History    Past Medical History:  Diagnosis Date   ADHD (attention deficit hyperactivity disorder)    Allergic rhinitis    Anemia    hx of   Bipolar 1 disorder (HCC)    Bladder tumor    Cancer ([place])    bladder   Dilated cardiomyopathy ([place]) 12/20/2020   Echo 6/22 ([facility]): EF 43, mild MR, mild TR // Echo 3/22: EF 50-55   Dizziness    Full dentures    GERD (gastroesophageal reflux disease)    Gross hematuria    Hyperlipidemia    Hypertension    Type 2 diabetes mellitus ([place])    Urgency of urination    dysuria, sui   Wears glasses     Past Surgical History:  Procedure Laterality Date   CYSTOSCOPY N/A 10/03/2015   Procedure: CYSTOSCOPY;  Surgeon: Irine Seal, MD;  Location[facility];  Service: Urology;  Laterality: N/A;   CYSTOSCOPY WITH STENT PLACEMENT Right 10/31/2015   Procedure: CYSTOSCOPY WITH ATTEMPTED RIGHT URETERAL OPENING;  Surgeon: Irine Seal, MD;  Location: [facility];  Service: Urology;  Laterality: Right;   FOOT SURGERY Left    September 2022, broke 7 bones in left foot   INTERSTIM IMPLANT PLACEMENT N/A 03/14/2018   Procedure: Barrie Lyme IMPLANT FIRST  STAGE;  Surgeon: Cleon Gustin, MD;  Location: [facility];  Service: Urology;  Laterality: N/A;   INTERSTIM IMPLANT PLACEMENT N/A 03/30/2018   Procedure: Barrie Lyme IMPLANT SECOND STAGE;  Surgeon: Cleon Gustin, MD;  Location: [facility];  Service: Urology;  Laterality: N/A;   LEFT HEART CATH AND CORONARY ANGIOGRAPHY N/A 05/31/2020   Procedure: LEFT HEART CATH AND CORONARY ANGIOGRAPHY;  Surgeon: Troy Sine, MD;  Location: [place] [facility];  Service: Cardiovascular;  Laterality: N/A;   MULTIPLE TOOTH EXTRACTIONS  2015   OVARIAN CYST REMOVAL Right 2004 approx   SHOULDER OPEN ROTATOR CUFF REPAIR Left 04/20/2019   Procedure: ROTATOR CUFF REPAIR SHOULDER OPEN;  Surgeon: Carole Civil, MD;  Location: [facility];  Service: Orthopedics;  Laterality: Left;   TONSILLECTOMY  12/30/2004   TOTAL ABDOMINAL HYSTERECTOMY W/ BILATERAL SALPINGOOPHORECTOMY  05/16/2004   TRANSURETHRAL RESECTION OF BLADDER TUMOR N/A 10/03/2015   Procedure: TRANSURETHRAL RESECTION OF BLADDER TUMOR (TURBT);  Surgeon: Irine Seal, MD;  Location: [facility];  Service: Urology;  Laterality: N/A;   TRANSURETHRAL RESECTION OF BLADDER TUMOR N/A 10/31/2015   Procedure: RE-STAGINGG TRANSURETHRAL RESECTION OF BLADDER TUMOR (TURBT);  Surgeon: Irine Seal, MD;  Location: [facility];  Service: Urology;  Laterality: N/A;   TRANSURETHRAL RESECTION OF BLADDER TUMOR N/A 08/17/2016  Procedure: TRANSURETHRAL RESECTION OF BLADDER TUMOR (TURBT);  Surgeon: Cleon Gustin, MD;  Location: [facility];  Service: Urology;  Laterality: N/A;    Family History  Problem Relation Age of Onset   Hypertension Mother    Cancer Mother    Breast cancer Maternal Aunt    Lung cancer Maternal Uncle    Cancer Other    Diabetes Daughter    Colon cancer Neg Hx     Social History   Socioeconomic History   Marital status: Married    Spouse name: Not on file   Number of children: Not on file   Years of education: Not on  file   Highest education level: Not on file  Occupational History   Not on file  Tobacco Use   Smoking status: Former    Packs/day: 0.50    Years: 10.00    Pack years: 5.00    Types: Cigarettes    Quit date: 11/12/2019    Years since quitting: 1.2   Smokeless tobacco: Never  Vaping Use   Vaping Use: Never used  Substance and Sexual Activity   Alcohol use: No   Drug use: No   Sexual activity: Not Currently    Birth control/protection: Surgical    Comment: hyst  Other Topics Concern   Not on file  Social History Narrative   Not on file   Social Determinants of Health   Financial Resource Strain: Not on file  Food Insecurity: Not on file  Transportation Needs: Not on file  Physical Activity: Not on file  Stress: Not on file  Social Connections: Not on file  Intimate Partner Violence: Not on file     Physical Exam   Vitals:   01/23/21 0300 01/23/21 0320  BP: (!) 142/78 128/79  Pulse: 73 81  Resp: 18 (!) 23  Temp:    SpO2: 96% 97%    CONSTITUTIONAL: Well-appearing, NAD NEURO/PSYCH:  Alert and oriented x 3, no focal deficits EYES:  eyes equal and reactive ENT/NECK:  no LAD, no JVD CARDIO: Regular rate, well-perfused, normal S1 and S2 PULM:  CTAB no wheezing or rhonchi GI/GU:  non-distended, non-tender MSK/SPINE:  No gross deformities, no edema; reduced range of motion of the left knee due to pain SKIN:  no rash, atraumatic   *Additional and/or pertinent findings included in MDM below  Diagnostic and Interventional Summary    EKG Interpretation  Date/Time:  Thursday January 23 2021 00:14:48 EST Ventricular Rate:  70 PR Interval:  145 QRS Duration: 113 QT Interval:  432 QTC Calculation: 467 R Axis:   90 Text Interpretation: Sinus rhythm Borderline intraventricular conduction delay No significant change was found Confirmed by Gerlene Fee [phone]) on 01/23/2021 1:10:45 AM       Labs Reviewed  CBC - Abnormal; Notable for the following components:       Result Value   MCH 25.4 (*)    Platelets 425 (*)    All other components within normal limits  COMPREHENSIVE METABOLIC PANEL - Abnormal; Notable for the following components:   Sodium 134 (*)    Calcium 8.8 (*)    Albumin 3.4 (*)    Total Bilirubin 0.1 (*)    All other components within normal limits  LIPASE, BLOOD  PROTIME-INR  CBG MONITORING, ED  TROPONIN I (HIGH SENSITIVITY)  TROPONIN I (HIGH SENSITIVITY)    DG Chest Port 1 View  Final Result    DG Knee Complete 4 Views Left  Final Result  Medications  sodium chloride 0.9 % bolus 1,000 mL (0 mLs Intravenous Stopped 01/23/21 0239)  fentaNYL (SUBLIMAZE) injection 50 mcg (50 mcg Intravenous Given 01/23/21 0121)  ondansetron (ZOFRAN) injection 4 mg (4 mg Intravenous Given 01/23/21 0120)  fentaNYL (SUBLIMAZE) injection 50 mcg (50 mcg Intravenous Given 01/23/21 0241)     Procedures  /  Critical Care Procedures  ED Course and Medical Decision Making  Initial Impression and Ddx Fall, knee pain, chest pain, nausea vomiting.  Considering MSK or traumatic chest pain but also considering ACS especially given patient's history of CAD.  Diffuse based on recent cath.  EKG is unchanged, awaiting labs, troponin.  Past medical/surgical history that increases complexity of ED encounter: CAD  Interpretation of Diagnostics I personally reviewed the EKG and my interpretation is as follows: Sinus rhythm with no significant changes from prior    Troponin is negative x2  Patient Reassessment and Ultimate Disposition/Management Patient looks and feels well, no further vomiting, pain well controlled, x-rays are normal, appropriate for discharge.  Suspect MSK related pain from the fall.  Patient management required discussion with the following services or consulting groups:  None  Complexity of Problems Addressed Acute complicated illness or Injury  Additional Data Reviewed and Analyzed Further history obtained from: Prior cath  report/recent echo  Patient Encounter Risk Assessment High:  Consideration of hospitalization  Barth Kirks. [place] Small, [place] [email or web address]  Final Clinical Impressions(s) / ED Diagnoses     ICD-10-CM   1. Chest pain, unspecified type  R07.9     2. Acute pain of left knee  M25.562       ED Discharge Orders     None        Discharge Instructions Discussed with and Provided to Patient:     Discharge Instructions      You were evaluated in the Emergency Department and after careful evaluation, we did not find any emergent condition requiring admission or further testing in the hospital.  Your exam/testing today was overall reassuring.  Please return to the Emergency Department if you experience any worsening of your condition.  Thank you for allowing [place] to be a part of your care.        Maudie Flakes, MD 01/23/21 [phone]

## 2021-01-26 NOTE — Progress Notes (Signed)
Subjective:  Patient presents today status post metatarsal head resection 3, 4, 5 left foot. DOS: 09/26/2020.  Patient states that she is doing much better.  She states that she is about 75% better.  She presents for further treatment and evaluation  Past Medical History:  Diagnosis Date   ADHD (attention deficit hyperactivity disorder)    Allergic rhinitis    Anemia    hx of   Bipolar 1 disorder (HCC)    Bladder tumor    Cancer ([place])    bladder   Dilated cardiomyopathy ([place]) 12/20/2020   Echo 6/22 ([facility]): EF 43, mild MR, mild TR // Echo 3/22: EF 50-55   Dizziness    Full dentures    GERD (gastroesophageal reflux disease)    Gross hematuria    Hyperlipidemia    Hypertension    Type 2 diabetes mellitus ([place])    Urgency of urination    dysuria, sui   Wears glasses      Past Surgical History:  Procedure Laterality Date   CYSTOSCOPY N/A 10/03/2015   Procedure: CYSTOSCOPY;  Surgeon: Irine Seal, MD;  [facility];  Service: Urology;  Laterality: N/A;   CYSTOSCOPY WITH STENT PLACEMENT Right 10/31/2015   Procedure: CYSTOSCOPY WITH ATTEMPTED RIGHT URETERAL OPENING;  Surgeon: Irine Seal, MD;  Location: [facility];  Service: Urology;  Laterality: Right;   FOOT SURGERY Left    September 2022, broke 7 bones in left foot   INTERSTIM IMPLANT PLACEMENT N/A 03/14/2018   Procedure: Barrie Lyme IMPLANT FIRST STAGE;  Surgeon: Cleon Gustin, MD;  Location: [facility];  Service: Urology;  Laterality: N/A;   INTERSTIM IMPLANT PLACEMENT N/A 03/30/2018   Procedure: Barrie Lyme IMPLANT SECOND STAGE;  Surgeon: Cleon Gustin, MD;  Location: [facility];  Service: Urology;  Laterality: N/A;   LEFT HEART CATH AND CORONARY ANGIOGRAPHY N/A 05/31/2020   Procedure: LEFT HEART CATH AND CORONARY ANGIOGRAPHY;  Surgeon: Troy Sine, MD;  Location: [place] [facility];  Service: Cardiovascular;  Laterality: N/A;   MULTIPLE TOOTH EXTRACTIONS  2015   OVARIAN CYST REMOVAL  Right 2004 approx   SHOULDER OPEN ROTATOR CUFF REPAIR Left 04/20/2019   Procedure: ROTATOR CUFF REPAIR SHOULDER OPEN;  Surgeon: Carole Civil, MD;  Location: [facility];  Service: Orthopedics;  Laterality: Left;   TONSILLECTOMY  12/30/2004   TOTAL ABDOMINAL HYSTERECTOMY W/ BILATERAL SALPINGOOPHORECTOMY  05/16/2004   TRANSURETHRAL RESECTION OF BLADDER TUMOR N/A 10/03/2015   Procedure: TRANSURETHRAL RESECTION OF BLADDER TUMOR (TURBT);  Surgeon: Irine Seal, MD;  Location: [facility];  Service: Urology;  Laterality: N/A;   TRANSURETHRAL RESECTION OF BLADDER TUMOR N/A 10/31/2015   Procedure: RE-STAGINGG TRANSURETHRAL RESECTION OF BLADDER TUMOR (TURBT);  Surgeon: Irine Seal, MD;  Location: [facility];  Service: Urology;  Laterality: N/A;   TRANSURETHRAL RESECTION OF BLADDER TUMOR N/A 08/17/2016   Procedure: TRANSURETHRAL RESECTION OF BLADDER TUMOR (TURBT);  Surgeon: Cleon Gustin, MD;  Location: [facility];  Service: Urology;  Laterality: N/A;   Allergies  Allergen Reactions   Ibuprofen Hives    itching itching itching   Benazepril Hives   Diphenhydramine Hives    Other reaction(s): hives   Metronidazole Nausea And Vomiting    Other reaction(s): nausea   Oxycodone     Pt has tolerated hydromorphone in the past and takes Norco at home.   Adhesive [Tape] Rash   Codeine Nausea And Vomiting and Rash   Loratadine Rash   Nystatin-Triamcinolone  Rash   Penicillins Rash    Has patient had a PCN reaction causing immediate rash, facial/tongue/throat swelling, SOB or lightheadedness with hypotension: No Has patient had a PCN reaction causing severe rash involving mucus membranes or skin necrosis: Yes Has patient had a PCN reaction that required hospitalization: No Has patient had a PCN reaction occurring within the last 10 years: no  If all of the above answers are "NO", then may proceed with Cephalosporin use. Other reaction(s): nausea     Objective/Physical  Exam Neurovascular status intact.  Skin incisions appear to be well coapted and healed. No sign of infectious process noted. No dehiscence. No active bleeding noted.  Today there is really no edema noted to the surgical extremity.  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  Toes are in rectus alignment. Osteotomy sites appear stable with routine healing  Assessment: 1. s/p metatarsal head resections 3, 4, 5 left. DOS: 09/26/2020   Plan of Care:  1. Patient was evaluated.   2.  Continue daily range of motion exercises 3.  Unfortunately diabetic shoes and insoles were not approved through the patient's insurance. 4.  Overall the patient is doing very well.  She may now resume full activity no restrictions and good supportive shoes and sneakers 5.  Return to clinic as needed   Edrick Kins, DPM [facility]  Dr. Edrick Kins, DPM    [address], [place] [address]                Office [phone]  Fax [phone]

## 2021-02-05 NOTE — Progress Notes (Signed)
Referring Provider: Coolidge Breeze, FNP Primary Care Physician:  Coolidge Breeze, FNP Primary GI Physician: Dr. Gala Romney  Chief Complaint  Patient presents with   Dysphagia    Food gets stuck, worse with meat and bread. Difficulty with meds too    HPI:   Sandra [name] is a 46 y.o. female presenting today for follow-up of dysphagia, GERD, and rectal bleeding.  Last seen in our office at the time of initial visit on 05/17/2020.  She reported long history of dysphagia, trouble with food and pills getting stuck in her esophagus with occasional regurgitation.  Symptoms were daily.  Also with uncontrolled reflux on Protonix 40 mg daily.  Insurance would not pay for omeprazole.  Also reported history of hemorrhoids that prolapse with intermittent rectal bleeding which started about 3 months ago.  Uses Preparation H which was working well.  Wanted to schedule EGD and colonoscopy, but due to uncontrolled diabetes (A1c 14.5), recommended office visit with endocrinology prior to scheduling.  Plan to increase Protonix to twice daily, CBC, Anusol rectal cream.  Labs completed revealing hemoglobin 14.3, glucose elevated at 374.  Patient underwent cardiac catheterization on 05/31/2020 revealing multivessel CAD.  Recommended medical therapy, aggressive lipid management.  She has re-established with endocrinology. Last A1C 8.6 in September 2022.   Today: Blood sugars are under much better control. Also stopped smoking 5 days ago.   GERD: Well controlled on Protonix BID. If she misses a dose, she will have breakthrough. No abdominal pain, nausea, vomiting. No weight loss.   Dysphagia: Continues with solid food and pill dysphagia. Worsening. Daily. Meats, breads, peanut butter. Vegetables are ok. Also with odynophagia.   Rectal bleeding: Continues with intermittent rectal bleeding if she has a hard stool. Once a week. Just on toilet tissue. Has hemorrhoids that prolapse. Bowels move once every 3 days.  Straining. Constipation is chronic.   Takes diclofenac for back pain. Tylenol not helpful. Also takes 81 mg aspirin.   Has chest pain when she is upset. This is stable. No chest pain with activity. Some SOB with activity.   Past Medical History:  Diagnosis Date   ADHD (attention deficit hyperactivity disorder)    Allergic rhinitis    Anemia    hx of   Bipolar 1 disorder (HCC)    Bladder tumor    CAD (coronary artery disease)    Cancer (HCC)    bladder   Dilated cardiomyopathy ([place]) 12/20/2020   Echo 6/22 ([facility]): EF 43, mild MR, mild TR // Echo 3/22: EF 50-55   Dizziness    Full dentures    GERD (gastroesophageal reflux disease)    Gross hematuria    Hyperlipidemia    Hypertension    Stable angina (HCC)    Type 2 diabetes mellitus ([place])    Urgency of urination    dysuria, sui   Wears glasses     Past Surgical History:  Procedure Laterality Date   CYSTOSCOPY N/A 10/03/2015   Procedure: CYSTOSCOPY;  Surgeon: Irine Seal, MD;  Location: [facility];  Service: Urology;  Laterality: N/A;   CYSTOSCOPY WITH STENT PLACEMENT Right 10/31/2015   Procedure: CYSTOSCOPY WITH ATTEMPTED RIGHT URETERAL OPENING;  Surgeon: Irine Seal, MD;  Location: [facility];  Service: Urology;  Laterality: Right;   FOOT SURGERY Left    September 2022, broke 7 bones in left foot   INTERSTIM IMPLANT PLACEMENT N/A 03/14/2018   Procedure: Barrie Lyme IMPLANT FIRST STAGE;  Surgeon: [name]  L, MD;  Location: [facility];  Service: Urology;  Laterality: N/A;   INTERSTIM IMPLANT PLACEMENT N/A 03/30/2018   Procedure: Barrie Lyme IMPLANT SECOND STAGE;  Surgeon: Cleon Gustin, MD;  Location: [facility];  Service: Urology;  Laterality: N/A;   LEFT HEART CATH AND CORONARY ANGIOGRAPHY N/A 05/31/2020   Procedure: LEFT HEART CATH AND CORONARY ANGIOGRAPHY;  Surgeon: Troy Sine, MD;  Location: [place];  Service: Cardiovascular;  Laterality: N/A;   MULTIPLE TOOTH EXTRACTIONS   2015   OVARIAN CYST REMOVAL Right 2004 approx   SHOULDER OPEN ROTATOR CUFF REPAIR Left 04/20/2019   Procedure: ROTATOR CUFF REPAIR SHOULDER OPEN;  Surgeon: Carole Civil, MD;  Location: [facility];  Service: Orthopedics;  Laterality: Left;   TONSILLECTOMY  12/30/2004   TOTAL ABDOMINAL HYSTERECTOMY W/ BILATERAL SALPINGOOPHORECTOMY  05/16/2004   TRANSURETHRAL RESECTION OF BLADDER TUMOR N/A 10/03/2015   Procedure: TRANSURETHRAL RESECTION OF BLADDER TUMOR (TURBT);  Surgeon: Irine Seal, MD;  Location: [facility];  Service: Urology;  Laterality: N/A;   TRANSURETHRAL RESECTION OF BLADDER TUMOR N/A 10/31/2015   Procedure: RE-STAGINGG TRANSURETHRAL RESECTION OF BLADDER TUMOR (TURBT);  Surgeon: Irine Seal, MD;  Location: [facility];  Service: Urology;  Laterality: N/A;   TRANSURETHRAL RESECTION OF BLADDER TUMOR N/A 08/17/2016   Procedure: TRANSURETHRAL RESECTION OF BLADDER TUMOR (TURBT);  Surgeon: Cleon Gustin, MD;  Location: [facility];  Service: Urology;  Laterality: N/A;    Current Outpatient Medications  Medication Sig Dispense Refill   ACCU-CHEK GUIDE test strip USE AS INSTRUCTED TO MONITOR GLUCOSE 4 TIMES DAILY BEFORE MEALS AND BEFORE BED. 100 strip 0   Accu-Chek Softclix Lancets lancets Use as instructed to monitor glucose 4 times daily 100 each 12   amLODipine (NORVASC) 5 MG tablet Take 1 tablet (5 mg total) by mouth daily. 90 tablet 3   aspirin EC 81 MG tablet Take 1 tablet (81 mg total) by mouth daily. Swallow whole. 30 tablet 11   Blood Glucose Monitoring Suppl (ACCU-CHEK GUIDE) w/Device KIT 1 Piece by Does not apply route as directed. 1 kit 0   Blood Pressure Monitoring (BLOOD PRESSURE CUFF) MISC See admin instructions.     buPROPion (WELLBUTRIN XL) 300 MG 24 hr tablet Take 300 mg by mouth every morning.     CLENPIQ 10-3.5-12 MG-GM -GM/160ML SOLN Take 1 kit by mouth once for 1 dose. 320 mL 0   cloNIDine HCl (KAPVAY) 0.1 MG TB12 ER tablet 1 tablet at  bedtime.     Continuous Blood Gluc Sensor (DEXCOM G6 SENSOR) MISC Apply new sensor every 10 days as directed 9 each 3   Continuous Blood Gluc Transmit (DEXCOM G6 TRANSMITTER) MISC 1 Device by Does not apply route every 3 (three) months. 3 each 1   diclofenac (VOLTAREN) 75 MG EC tablet Take 75 mg by mouth 2 (two) times daily.     Evolocumab (REPATHA SURECLICK) 102 MG/ML SOAJ Inject 1 Dose into the skin every 14 (fourteen) days. 2 mL 11   fenofibrate (TRICOR) 145 MG tablet Take 145 mg by mouth daily.     FLUoxetine (PROZAC) 20 MG capsule Take 20 mg by mouth daily. Takes  at bedtime     FLUoxetine (PROZAC) 40 MG capsule Take 40 mg by mouth daily.     gabapentin (NEURONTIN) 300 MG capsule TAKE 1 CAPSULE(300 MG) BY MOUTH THREE TIMES DAILY 90 capsule 0   glipiZIDE (GLUCOTROL XL) 5 MG 24 hr tablet Take 1 tablet (5 mg total)  by mouth daily with breakfast. 90 tablet 3   insulin aspart (NOVOLOG) 100 UNIT/ML FlexPen Inject 10-16 Units into the skin 3 (three) times daily with meals. 30 mL 3   insulin glargine (LANTUS) 100 UNIT/ML Solostar Pen Inject 30 Units into the skin at bedtime. 30 mL 3   Insulin Pen Needle (PEN NEEDLES) 33G X 4 MM MISC 1 each by Does not apply route in the morning, at noon, in the evening, and at bedtime. 100 each 12   isosorbide mononitrate (IMDUR) 60 MG 24 hr tablet Take 1 tablet (60 mg total) by mouth daily. 90 tablet 3   metFORMIN (GLUCOPHAGE) 1000 MG tablet 2 tablet with meals     metoprolol succinate (TOPROL-XL) 100 MG 24 hr tablet Take 1 tablet (100 mg total) by mouth daily. Take with or immediately following a meal. 90 tablet 3   montelukast (SINGULAIR) 10 MG tablet Take 10 mg by mouth at bedtime.     nitroGLYCERIN (NITROSTAT) 0.4 MG SL tablet Place 1 tablet (0.4 mg total) under the tongue every 5 (five) minutes as needed for chest pain. 25 tablet 11   rosuvastatin (CRESTOR) 40 MG tablet Take 40 mg by mouth daily.     VYVANSE 40 MG capsule Take 40 mg by mouth every morning.      hydrocortisone (ANUSOL-HC) 2.5 % rectal cream Place 1 application rectally 2 (two) times daily. 30 g 1   pantoprazole (PROTONIX) 40 MG tablet Take 1 tablet (40 mg total) by mouth 2 (two) times daily before a meal. 60 tablet 5   Current Facility-Administered Medications  Medication Dose Route Frequency Provider Last Rate Last Admin   betamethasone acetate-betamethasone sodium phosphate (CELESTONE) injection 3 mg  3 mg Intramuscular Once Daylene Katayama M, DPM        Allergies as of 02/06/2021 - Review Complete 02/06/2021  Allergen Reaction Noted   Ibuprofen Hives 05/12/2016   Benazepril Hives 11/25/2014   Diphenhydramine Hives 04/04/2020   Metronidazole Nausea And Vomiting 11/05/2020   Oxycodone  08/19/2016   Adhesive [tape] Rash 09/30/2015   Codeine Nausea And Vomiting and Rash    Loratadine Rash    Nystatin-triamcinolone Rash 02/24/2018   Penicillins Rash 11/05/2020    Family History  Problem Relation Age of Onset   Hypertension Mother    Cancer Mother    Breast cancer Maternal Aunt    Lung cancer Maternal Uncle    Cancer Other    Diabetes Daughter    Colon cancer Neg Hx     Social History   Socioeconomic History   Marital status: Married    Spouse name: Not on file   Number of children: Not on file   Years of education: Not on file   Highest education level: Not on file  Occupational History   Not on file  Tobacco Use   Smoking status: Former    Packs/day: 0.50    Years: 10.00    Pack years: 5.00    Types: Cigarettes    Quit date: 01/31/2021    Years since quitting: 0.0   Smokeless tobacco: Never   Tobacco comments:    02/06/21-no smoking past 5 days  Vaping Use   Vaping Use: Never used  Substance and Sexual Activity   Alcohol use: No   Drug use: No   Sexual activity: Not Currently    Birth control/protection: Surgical    Comment: hyst  Other Topics Concern   Not on file  Social History Narrative   Not  on file   Social Determinants of Health    Financial Resource Strain: Not on file  Food Insecurity: Not on file  Transportation Needs: Not on file  Physical Activity: Not on file  Stress: Not on file  Social Connections: Not on file    Review of Systems: Gen: Denies fever, chills, cold or flulike symptoms, presyncope, syncope. CV: See HPI Resp: See HPI GI: See HPI Heme: See HPI  Physical Exam: BP (!) 147/86    Pulse 95    Temp (!) 96.8 F (36 C) (Temporal)    Ht 5' (1.524 m)    Wt 173 lb 6.4 oz (78.7 kg)    BMI 33.86 kg/m  General:   Alert and oriented. No distress noted. Pleasant and cooperative.  Head:  Normocephalic and atraumatic. Eyes:  Conjuctiva clear without scleral icterus. Heart:  S1, S2 present without murmurs appreciated. Lungs:  Clear to auscultation bilaterally. No wheezes, rales, or rhonchi. No distress.  Abdomen:  +BS, soft, and non-distended. Slight TTP in epigastric area. No rebound or guarding. No HSM or masses noted. Msk:  Symmetrical without gross deformities. Normal posture. Extremities:  Without edema. Neurologic:  Alert and  oriented x4 Psych:  Normal mood and affect.    Assessment: 46 year old female with history significant for CAD and stable angina symptoms, dilated cardiomyopathy, HTN, HLD, T2DM, bipolar disorder, presenting today for follow-up of GERD, dysphagia, rectal bleeding, and also reporting chronic constipation.  GERD: Chronic.  Now well controlled on Protonix 40 mg twice daily.  Dysphagia/odynophagia: Long history of solid food and pill dysphagia, worsening.  Also with odynophagia.  This was previously in the setting of uncontrolled GERD, now well managed with PPI twice daily.  No prior EGD.  Previously recommended better control of diabetes prior to scheduling.  Hemoglobin A1c now much improved, down to the 8 range from 14 range.  She needs EGD for further evaluation and therapeutic intervention.  Differentials include reflux esophagitis, esophageal web, ring, stricture,  malignancy, Candida esophagitis.  Chronic constipation/rectal bleeding/hemorrhoids: Chronically with hard BMs every 3 days requiring straining.  Not on any therapy for this.  She does report chronic history of intermittent toilet tissue hematochezia when passing a hard stool in the setting of known hemorrhoids that prolapse.  She uses Anusol cream as needed which works fairly well.  Most recent hemoglobin was 12.3 on 01/23/2021. No prior colonoscopy.  No family history of colon cancer.  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 Rectal bleeding likely secondary to hemorrhoids though she needs a colonoscopy to rule out other etiologies such as polyps or malignancy.   Plan:  EGD +/- dilation + colonoscopy with propofol with Dr. Abbey Chatters in the near future. The risks, benefits, and alternatives have been discussed with the patient in detail. The patient states understanding and desires to proceed. ASA 3/4 See separate instructions for diabetes medication adjustments. Continue Protonix 40 mg twice daily.  Refill sent. Start Linzess 72 mcg daily 30 minutes before first meal.  Samples provided.  Requested progress report in 1 week. Continue Anusol rectal cream twice daily as needed.  Refill sent. Avoid straining and limit toilet time to 2-3 minutes. Follow-up after procedures.   Aliene Altes, PA-C [facility] 02/06/2021

## 2021-02-05 NOTE — H&P (View-Only) (Signed)
Referring Provider: Coolidge Breeze, FNP Primary Care Physician:  Coolidge Breeze, FNP Primary GI Physician: Dr. Gala Romney  Chief Complaint  Patient presents with   Dysphagia    Food gets stuck, worse with meat and bread. Difficulty with meds too    HPI:   Sandra [name] is a 46 y.o. female presenting today for follow-up of dysphagia, GERD, and rectal bleeding.  Last seen in our office at the time of initial visit on 05/17/2020.  She reported long history of dysphagia, trouble with food and pills getting stuck in her esophagus with occasional regurgitation.  Symptoms were daily.  Also with uncontrolled reflux on Protonix 40 mg daily.  Insurance would not pay for omeprazole.  Also reported history of hemorrhoids that prolapse with intermittent rectal bleeding which started about 3 months ago.  Uses Preparation H which was working well.  Wanted to schedule EGD and colonoscopy, but due to uncontrolled diabetes (A1c 14.5), recommended office visit with endocrinology prior to scheduling.  Plan to increase Protonix to twice daily, CBC, Anusol rectal cream.  Labs completed revealing hemoglobin 14.3, glucose elevated at 374.  Patient underwent cardiac catheterization on 05/31/2020 revealing multivessel CAD.  Recommended medical therapy, aggressive lipid management.  She has re-established with endocrinology. Last A1C 8.6 in September 2022.   Today: Blood sugars are under much better control. Also stopped smoking 5 days ago.   GERD: Well controlled on Protonix BID. If she misses a dose, she will have breakthrough. No abdominal pain, nausea, vomiting. No weight loss.   Dysphagia: Continues with solid food and pill dysphagia. Worsening. Daily. Meats, breads, peanut butter. Vegetables are ok. Also with odynophagia.   Rectal bleeding: Continues with intermittent rectal bleeding if she has a hard stool. Once a week. Just on toilet tissue. Has hemorrhoids that prolapse. Bowels move once every 3 days.  Straining. Constipation is chronic.   Takes diclofenac for back pain. Tylenol not helpful. Also takes 81 mg aspirin.   Has chest pain when she is upset. This is stable. No chest pain with activity. Some SOB with activity.   Past Medical History:  Diagnosis Date   ADHD (attention deficit hyperactivity disorder)    Allergic rhinitis    Anemia    hx of   Bipolar 1 disorder (HCC)    Bladder tumor    CAD (coronary artery disease)    Cancer (HCC)    bladder   Dilated cardiomyopathy ([place]) 12/20/2020   Echo 6/22 ([facility]): EF 43, mild MR, mild TR // Echo 3/22: EF 50-55   Dizziness    Full dentures    GERD (gastroesophageal reflux disease)    Gross hematuria    Hyperlipidemia    Hypertension    Stable angina (HCC)    Type 2 diabetes mellitus ([place])    Urgency of urination    dysuria, sui   Wears glasses     Past Surgical History:  Procedure Laterality Date   CYSTOSCOPY N/A 10/03/2015   Procedure: CYSTOSCOPY;  Surgeon: Irine Seal, MD;  Location: [facility];  Service: Urology;  Laterality: N/A;   CYSTOSCOPY WITH STENT PLACEMENT Right 10/31/2015   Procedure: CYSTOSCOPY WITH ATTEMPTED RIGHT URETERAL OPENING;  Surgeon: Irine Seal, MD;  Location: [facility];  Service: Urology;  Laterality: Right;   FOOT SURGERY Left    September 2022, broke 7 bones in left foot   INTERSTIM IMPLANT PLACEMENT N/A 03/14/2018   Procedure: Barrie Lyme IMPLANT FIRST STAGE;  Surgeon: [name]  L, MD;  Location: [facility];  Service: Urology;  Laterality: N/A;   INTERSTIM IMPLANT PLACEMENT N/A 03/30/2018   Procedure: Barrie Lyme IMPLANT SECOND STAGE;  Surgeon: Cleon Gustin, MD;  Location: [facility];  Service: Urology;  Laterality: N/A;   LEFT HEART CATH AND CORONARY ANGIOGRAPHY N/A 05/31/2020   Procedure: LEFT HEART CATH AND CORONARY ANGIOGRAPHY;  Surgeon: Troy Sine, MD;  Location: [place] [facility];  Service: Cardiovascular;  Laterality: N/A;   MULTIPLE TOOTH EXTRACTIONS   2015   OVARIAN CYST REMOVAL Right 2004 approx   SHOULDER OPEN ROTATOR CUFF REPAIR Left 04/20/2019   Procedure: ROTATOR CUFF REPAIR SHOULDER OPEN;  Surgeon: Carole Civil, MD;  Location: [facility];  Service: Orthopedics;  Laterality: Left;   TONSILLECTOMY  12/30/2004   TOTAL ABDOMINAL HYSTERECTOMY W/ BILATERAL SALPINGOOPHORECTOMY  05/16/2004   TRANSURETHRAL RESECTION OF BLADDER TUMOR N/A 10/03/2015   Procedure: TRANSURETHRAL RESECTION OF BLADDER TUMOR (TURBT);  Surgeon: Irine Seal, MD;  Location: [facility];  Service: Urology;  Laterality: N/A;   TRANSURETHRAL RESECTION OF BLADDER TUMOR N/A 10/31/2015   Procedure: RE-STAGINGG TRANSURETHRAL RESECTION OF BLADDER TUMOR (TURBT);  Surgeon: Irine Seal, MD;  Location: [facility];  Service: Urology;  Laterality: N/A;   TRANSURETHRAL RESECTION OF BLADDER TUMOR N/A 08/17/2016   Procedure: TRANSURETHRAL RESECTION OF BLADDER TUMOR (TURBT);  Surgeon: Cleon Gustin, MD;  Location: [facility];  Service: Urology;  Laterality: N/A;    Current Outpatient Medications  Medication Sig Dispense Refill   ACCU-CHEK GUIDE test strip USE AS INSTRUCTED TO MONITOR GLUCOSE 4 TIMES DAILY BEFORE MEALS AND BEFORE BED. 100 strip 0   Accu-Chek Softclix Lancets lancets Use as instructed to monitor glucose 4 times daily 100 each 12   amLODipine (NORVASC) 5 MG tablet Take 1 tablet (5 mg total) by mouth daily. 90 tablet 3   aspirin EC 81 MG tablet Take 1 tablet (81 mg total) by mouth daily. Swallow whole. 30 tablet 11   Blood Glucose Monitoring Suppl (ACCU-CHEK GUIDE) w/Device KIT 1 Piece by Does not apply route as directed. 1 kit 0   Blood Pressure Monitoring (BLOOD PRESSURE CUFF) MISC See admin instructions.     buPROPion (WELLBUTRIN XL) 300 MG 24 hr tablet Take 300 mg by mouth every morning.     CLENPIQ 10-3.5-12 MG-GM -GM/160ML SOLN Take 1 kit by mouth once for 1 dose. 320 mL 0   cloNIDine HCl (KAPVAY) 0.1 MG TB12 ER tablet 1 tablet at  bedtime.     Continuous Blood Gluc Sensor (DEXCOM G6 SENSOR) MISC Apply new sensor every 10 days as directed 9 each 3   Continuous Blood Gluc Transmit (DEXCOM G6 TRANSMITTER) MISC 1 Device by Does not apply route every 3 (three) months. 3 each 1   diclofenac (VOLTAREN) 75 MG EC tablet Take 75 mg by mouth 2 (two) times daily.     Evolocumab (REPATHA SURECLICK) 102 MG/ML SOAJ Inject 1 Dose into the skin every 14 (fourteen) days. 2 mL 11   fenofibrate (TRICOR) 145 MG tablet Take 145 mg by mouth daily.     FLUoxetine (PROZAC) 20 MG capsule Take 20 mg by mouth daily. Takes  at bedtime     FLUoxetine (PROZAC) 40 MG capsule Take 40 mg by mouth daily.     gabapentin (NEURONTIN) 300 MG capsule TAKE 1 CAPSULE(300 MG) BY MOUTH THREE TIMES DAILY 90 capsule 0   glipiZIDE (GLUCOTROL XL) 5 MG 24 hr tablet Take 1 tablet (5 mg total)  by mouth daily with breakfast. 90 tablet 3   insulin aspart (NOVOLOG) 100 UNIT/ML FlexPen Inject 10-16 Units into the skin 3 (three) times daily with meals. 30 mL 3   insulin glargine (LANTUS) 100 UNIT/ML Solostar Pen Inject 30 Units into the skin at bedtime. 30 mL 3   Insulin Pen Needle (PEN NEEDLES) 33G X 4 MM MISC 1 each by Does not apply route in the morning, at noon, in the evening, and at bedtime. 100 each 12   isosorbide mononitrate (IMDUR) 60 MG 24 hr tablet Take 1 tablet (60 mg total) by mouth daily. 90 tablet 3   metFORMIN (GLUCOPHAGE) 1000 MG tablet 2 tablet with meals     metoprolol succinate (TOPROL-XL) 100 MG 24 hr tablet Take 1 tablet (100 mg total) by mouth daily. Take with or immediately following a meal. 90 tablet 3   montelukast (SINGULAIR) 10 MG tablet Take 10 mg by mouth at bedtime.     nitroGLYCERIN (NITROSTAT) 0.4 MG SL tablet Place 1 tablet (0.4 mg total) under the tongue every 5 (five) minutes as needed for chest pain. 25 tablet 11   rosuvastatin (CRESTOR) 40 MG tablet Take 40 mg by mouth daily.     VYVANSE 40 MG capsule Take 40 mg by mouth every morning.      hydrocortisone (ANUSOL-HC) 2.5 % rectal cream Place 1 application rectally 2 (two) times daily. 30 g 1   pantoprazole (PROTONIX) 40 MG tablet Take 1 tablet (40 mg total) by mouth 2 (two) times daily before a meal. 60 tablet 5   Current Facility-Administered Medications  Medication Dose Route Frequency Provider Last Rate Last Admin   betamethasone acetate-betamethasone sodium phosphate (CELESTONE) injection 3 mg  3 mg Intramuscular Once Daylene Katayama M, DPM        Allergies as of 02/06/2021 - Review Complete 02/06/2021  Allergen Reaction Noted   Ibuprofen Hives 05/12/2016   Benazepril Hives 11/25/2014   Diphenhydramine Hives 04/04/2020   Metronidazole Nausea And Vomiting 11/05/2020   Oxycodone  08/19/2016   Adhesive [tape] Rash 09/30/2015   Codeine Nausea And Vomiting and Rash    Loratadine Rash    Nystatin-triamcinolone Rash 02/24/2018   Penicillins Rash 11/05/2020    Family History  Problem Relation Age of Onset   Hypertension Mother    Cancer Mother    Breast cancer Maternal Aunt    Lung cancer Maternal Uncle    Cancer Other    Diabetes Daughter    Colon cancer Neg Hx     Social History   Socioeconomic History   Marital status: Married    Spouse name: Not on file   Number of children: Not on file   Years of education: Not on file   Highest education level: Not on file  Occupational History   Not on file  Tobacco Use   Smoking status: Former    Packs/day: 0.50    Years: 10.00    Pack years: 5.00    Types: Cigarettes    Quit date: 01/31/2021    Years since quitting: 0.0   Smokeless tobacco: Never   Tobacco comments:    02/06/21-no smoking past 5 days  Vaping Use   Vaping Use: Never used  Substance and Sexual Activity   Alcohol use: No   Drug use: No   Sexual activity: Not Currently    Birth control/protection: Surgical    Comment: hyst  Other Topics Concern   Not on file  Social History Narrative   Not  on file   Social Determinants of Health    Financial Resource Strain: Not on file  Food Insecurity: Not on file  Transportation Needs: Not on file  Physical Activity: Not on file  Stress: Not on file  Social Connections: Not on file    Review of Systems: Gen: Denies fever, chills, cold or flulike symptoms, presyncope, syncope. CV: See HPI Resp: See HPI GI: See HPI Heme: See HPI  Physical Exam: BP (!) 147/86    Pulse 95    Temp (!) 96.8 F (36 C) (Temporal)    Ht 5' (1.524 m)    Wt 173 lb 6.4 oz (78.7 kg)    BMI 33.86 kg/m  General:   Alert and oriented. No distress noted. Pleasant and cooperative.  Head:  Normocephalic and atraumatic. Eyes:  Conjuctiva clear without scleral icterus. Heart:  S1, S2 present without murmurs appreciated. Lungs:  Clear to auscultation bilaterally. No wheezes, rales, or rhonchi. No distress.  Abdomen:  +BS, soft, and non-distended. Slight TTP in epigastric area. No rebound or guarding. No HSM or masses noted. Msk:  Symmetrical without gross deformities. Normal posture. Extremities:  Without edema. Neurologic:  Alert and  oriented x4 Psych:  Normal mood and affect.    Assessment: 46 year old female with history significant for CAD and stable angina symptoms, dilated cardiomyopathy, HTN, HLD, T2DM, bipolar disorder, presenting today for follow-up of GERD, dysphagia, rectal bleeding, and also reporting chronic constipation.  GERD: Chronic.  Now well controlled on Protonix 40 mg twice daily.  Dysphagia/odynophagia: Long history of solid food and pill dysphagia, worsening.  Also with odynophagia.  This was previously in the setting of uncontrolled GERD, now well managed with PPI twice daily.  No prior EGD.  Previously recommended better control of diabetes prior to scheduling.  Hemoglobin A1c now much improved, down to the 8 range from 14 range.  She needs EGD for further evaluation and therapeutic intervention.  Differentials include reflux esophagitis, esophageal web, ring, stricture,  malignancy, Candida esophagitis.  Chronic constipation/rectal bleeding/hemorrhoids: Chronically with hard BMs every 3 days requiring straining.  Not on any therapy for this.  She does report chronic history of intermittent toilet tissue hematochezia when passing a hard stool in the setting of known hemorrhoids that prolapse.  She uses Anusol cream as needed which works fairly well.  Most recent hemoglobin was 12.3 on 01/23/2021. No prior colonoscopy.  No family history of colon cancer.  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 Rectal bleeding likely secondary to hemorrhoids though she needs a colonoscopy to rule out other etiologies such as polyps or malignancy.   Plan:  EGD +/- dilation + colonoscopy with propofol with Dr. Abbey Chatters in the near future. The risks, benefits, and alternatives have been discussed with the patient in detail. The patient states understanding and desires to proceed. ASA 3/4 See separate instructions for diabetes medication adjustments. Continue Protonix 40 mg twice daily.  Refill sent. Start Linzess 72 mcg daily 30 minutes before first meal.  Samples provided.  Requested progress report in 1 week. Continue Anusol rectal cream twice daily as needed.  Refill sent. Avoid straining and limit toilet time to 2-3 minutes. Follow-up after procedures.   Aliene Altes, PA-C [facility] 02/06/2021

## 2021-02-06 ENCOUNTER — Telehealth: Payer: Self-pay | Admitting: *Deleted

## 2021-02-06 ENCOUNTER — Encounter: Payer: Self-pay | Admitting: Gastroenterology

## 2021-02-06 ENCOUNTER — Ambulatory Visit: Payer: Medicaid Other | Admitting: Gastroenterology

## 2021-02-06 ENCOUNTER — Other Ambulatory Visit: Payer: Self-pay

## 2021-02-06 ENCOUNTER — Encounter: Payer: Self-pay | Admitting: *Deleted

## 2021-02-06 VITALS — BP 147/86 | HR 95 | Temp 96.8°F | Ht 60.0 in | Wt 173.4 lb

## 2021-02-06 DIAGNOSIS — R1310 Dysphagia, unspecified: Secondary | ICD-10-CM | POA: Insufficient documentation

## 2021-02-06 DIAGNOSIS — K649 Unspecified hemorrhoids: Secondary | ICD-10-CM | POA: Insufficient documentation

## 2021-02-06 DIAGNOSIS — K5900 Constipation, unspecified: Secondary | ICD-10-CM | POA: Diagnosis not present

## 2021-02-06 DIAGNOSIS — K625 Hemorrhage of anus and rectum: Secondary | ICD-10-CM | POA: Diagnosis not present

## 2021-02-06 DIAGNOSIS — K219 Gastro-esophageal reflux disease without esophagitis: Secondary | ICD-10-CM | POA: Diagnosis not present

## 2021-02-06 MED ORDER — PANTOPRAZOLE SODIUM 40 MG PO TBEC: 40.0000 mg | DELAYED_RELEASE_TABLET | Freq: Two times a day (BID) | ORAL | 5 refills | Status: DC

## 2021-02-06 MED ORDER — HYDROCORTISONE (PERIANAL) 2.5 % EX CREA: 1.0000 "application " | TOPICAL_CREAM | Freq: Two times a day (BID) | CUTANEOUS | 1 refills | Status: DC

## 2021-02-06 MED ORDER — CLENPIQ 10-3.5-12 MG-GM -GM/160ML PO SOLN: 1.0000 | Freq: Once | ORAL | 0 refills | Status: AC

## 2021-02-06 NOTE — Telephone Encounter (Signed)
LMOVM for pt with pre-op appt details.

## 2021-02-06 NOTE — Patient Instructions (Signed)
We will arrange for you to have an upper endoscopy with possible dilation and colonoscopy in the near future with Dr. Abbey Chatters. 1 day prior to your procedure: Take one half dose of metformin, take glipizide as prescribed, NovoLog as needed, and one half dose of Lantus. Day of your procedure: No morning diabetes medications.  While you are on clear liquids, monitor your blood sugars closely and correct any low blood sugars with improved sugary clear liquids.  For reflux: Continue taking Protonix 40 mg twice daily 30 minutes before breakfast and dinner.  I have sent a refill to your pharmacy.  For constipation: Start Linzess 72 mcg daily 30 minutes before your first meal.  We are providing you with samples.  Please call us with a progress report in 1 week.  If this medication works well, I will send a prescription to your pharmacy.  For hemorrhoids: Use Anusol rectal cream twice daily Limit toilet x2-3 minutes. Avoid straining.   We will follow-up with you in the office after your procedures.  Do not hesitate to call if you have any questions or concerns prior to your next visit.  It was great to see you again today!  Congratulations on getting your blood sugars under better control and stopping smoking!  Keep up the good work!  Aliene Altes, PA-C [facility]

## 2021-02-07 IMAGING — RF DG C-ARM 61-120 MIN
1 series · 2 of 2 positions shown · non-contrast
Comparison: CT 02/24/2018

CLINICAL DATA: InterStim insertion.

EXAM:
DG C-ARM 61-120 MIN

[Series 1: run · 2 of 2 slices shown]
[im 1/2]
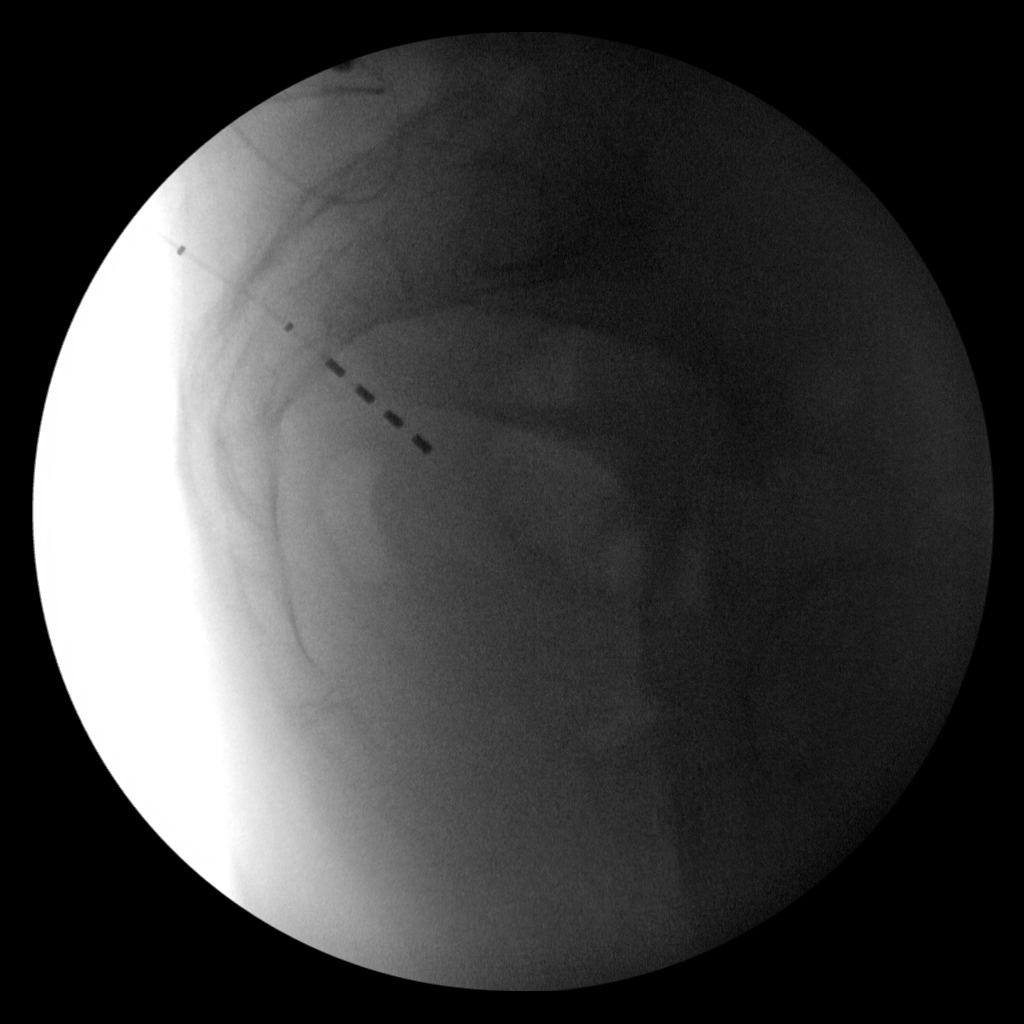
[im 2/2]
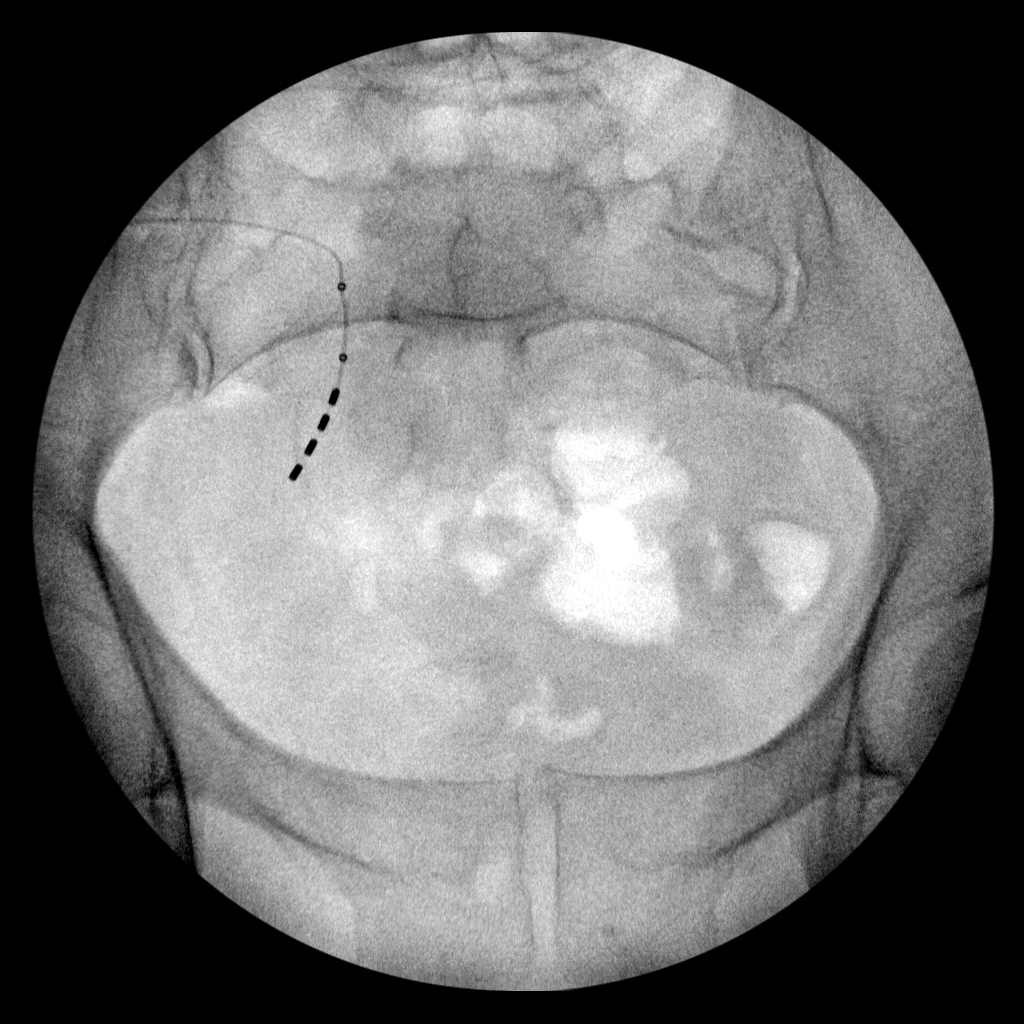

[2 of 2 positions shown; findings below may reference images not displayed]

FINDINGS: Small caliber catheter enters the right posterior pelvis with tip
over the right mid to upper posterior pelvis. Remainder of the exam
is unremarkable.
IMPRESSION: Evidence of patient's InterStim insertion with tip over the mid to
upper right pelvis.

## 2021-02-10 NOTE — Patient Instructions (Signed)
Sandra [name]  02/10/2021     @PREFPERIOPPHARMACY @   Your procedure is scheduled on 02/17/2021.   Report to [facility] at  0700  A.M.   Call this number if you have problems the morning of surgery:  [phone]   Remember:  Follow the diet and prep instructions given to you by the office.    Take 1/2 (15 units ) of your night time insulin the night before your procedure.    DO NOT take any medications for diabetes the morning of your procedure.    Bring extra Dexcom supplies in case it becomes dislodged during your procedure.      Take these medicines the morning of surgery with A SIP OF WATER          amlodipine, wellbutrin, voltaren, prozac, gabapentin, isosorbide, metoprolol, protonix, vyvanse.     Do not wear jewelry, make-up or nail polish.  Do not wear lotions, powders, or perfumes, or deodorant.  Do not shave 48 hours prior to surgery.  Men may shave face and neck.  Do not bring valuables to the hospital.  [facility] is not responsible for any belongings or valuables.  Contacts, dentures or bridgework may not be worn into surgery.  Leave your suitcase in the car.  After surgery it may be brought to your room.  For patients admitted to the hospital, discharge time will be determined by your treatment team.  Patients discharged the day of surgery will not be allowed to drive home and must have someone with them for 24 hours.    Special instructions:   DO NOT smoke tobacco or vape for 24 hours before your procedure.  Please read over the following fact sheets that you were given. Anesthesia Post-op Instructions and Care and Recovery After Surgery      Upper Endoscopy, Adult, Care After This sheet gives you information about how to care for yourself after your procedure. Your health care provider may also give you more specific instructions. If you have problems or questions, contact your health care provider. What can I expect after the  procedure? After the procedure, it is common to have: A sore throat. Mild stomach pain or discomfort. Bloating. Nausea. Follow these instructions at home:  Follow instructions from your health care provider about what to eat or drink after your procedure. Return to your normal activities as told by your health care provider. Ask your health care provider what activities are safe for you. Take over-the-counter and prescription medicines only as told by your health care provider. If you were given a sedative during the procedure, it can affect you for several hours. Do not drive or operate machinery until your health care provider says that it is safe. Keep all follow-up visits as told by your health care provider. This is important. Contact a health care provider if you have: A sore throat that lasts longer than one day. Trouble swallowing. Get help right away if: You vomit blood or your vomit looks like coffee grounds. You have: A fever. Bloody, black, or tarry stools. A severe sore throat or you cannot swallow. Difficulty breathing. Severe pain in your chest or abdomen. Summary After the procedure, it is common to have a sore throat, mild stomach discomfort, bloating, and nausea. If you were given a sedative during the procedure, it can affect you for several hours. Do not drive or operate machinery until your health care provider says that  it is safe. Follow instructions from your health care provider about what to eat or drink after your procedure. Return to your normal activities as told by your health care provider. This information is not intended to replace advice given to you by your health care provider. Make sure you discuss any questions you have with your health care provider. Document Revised: 11/04/2018 Document Reviewed: 05/31/2017 Elsevier Patient Education  2022 [place]. Esophageal Dilatation Esophageal dilatation, also called esophageal dilation, is a  procedure to widen or open a blocked or narrowed part of the esophagus. The esophagus is the part of the body that moves food and liquid from the mouth to the stomach. You may need this procedure if: You have a buildup of scar tissue in your esophagus that makes it difficult, painful, or impossible to swallow. This can be caused by gastroesophageal reflux disease (GERD). You have cancer of the esophagus. There is a problem with how food moves through your esophagus. In some cases, you may need this procedure repeated at a later time to dilate the esophagus gradually. Tell a health care provider about: Any allergies you have. All medicines you are taking, including vitamins, herbs, eye drops, creams, and over-the-counter medicines. Any problems you or family members have had with anesthetic medicines. Any blood disorders you have. Any surgeries you have had. Any medical conditions you have. Any antibiotic medicines you are required to take before dental procedures. Whether you are pregnant or may be pregnant. What are the risks? Generally, this is a safe procedure. However, problems may occur, including: Bleeding due to a tear in the lining of the esophagus. A hole, or perforation, in the esophagus. What happens before the procedure? Ask your health care provider about: Changing or stopping your regular medicines. This is especially important if you are taking diabetes medicines or blood thinners. Taking medicines such as aspirin and ibuprofen. These medicines can thin your blood. Do not take these medicines unless your health care provider tells you to take them. Taking over-the-counter medicines, vitamins, herbs, and supplements. Follow instructions from your health care provider about eating or drinking restrictions. Plan to have a responsible adult take you home from the hospital or clinic. Plan to have a responsible adult care for you for the time you are told after you leave the  hospital or clinic. This is important. What happens during the procedure? You may be given a medicine to help you relax (sedative). A numbing medicine may be sprayed into the back of your throat, or you may gargle the medicine. Your health care provider may perform the dilatation using various surgical instruments, such as: Simple dilators. This instrument is carefully placed in the esophagus to stretch it. Guided wire bougies. This involves using an endoscope to insert a wire into the esophagus. A dilator is passed over this wire to enlarge the esophagus. Then the wire is removed. Balloon dilators. An endoscope with a small balloon is inserted into the esophagus. The balloon is inflated to stretch the esophagus and open it up. The procedure may vary among health care providers and hospitals. What can I expect after the procedure? Your blood pressure, heart rate, breathing rate, and blood oxygen level will be monitored until you leave the hospital or clinic. Your throat may feel slightly sore and numb. This will get better over time. You will not be allowed to eat or drink until your throat is no longer numb. When you are able to drink, urinate, and sit on the  edge of the bed without nausea or dizziness, you may be able to return home. Follow these instructions at home: Take over-the-counter and prescription medicines only as told by your health care provider. If you were given a sedative during the procedure, it can affect you for several hours. Do not drive or operate machinery until your health care provider says that it is safe. Plan to have a responsible adult care for you for the time you are told. This is important. Follow instructions from your health care provider about any eating or drinking restrictions. Do not use any products that contain nicotine or tobacco, such as cigarettes, e-cigarettes, and chewing tobacco. If you need help quitting, ask your health care provider. Keep all  follow-up visits. This is important. Contact a health care provider if: You have a fever. You have pain that is not relieved by medicine. Get help right away if: You have chest pain. You have trouble breathing. You have trouble swallowing. You vomit blood. You have black, tarry, or bloody stools. These symptoms may represent a serious problem that is an emergency. Do not wait to see if the symptoms will go away. Get medical help right away. Call your local emergency services (911 in the [place] Do not drive yourself to the hospital. Summary Esophageal dilatation, also called esophageal dilation, is a procedure to widen or open a blocked or narrowed part of the esophagus. Plan to have a responsible adult take you home from the hospital or clinic. For this procedure, a numbing medicine may be sprayed into the back of your throat, or you may gargle the medicine. Do not drive or operate machinery until your health care provider says that it is safe. This information is not intended to replace advice given to you by your health care provider. Make sure you discuss any questions you have with your health care provider. Document Revised: 05/17/2019 Document Reviewed: 05/17/2019 Elsevier Patient Education  [place]. Colonoscopy, Adult, Care After This sheet gives you information about how to care for yourself after your procedure. Your health care provider may also give you more specific instructions. If you have problems or questions, contact your health care provider. What can I expect after the procedure? After the procedure, it is common to have: A small amount of blood in your stool for 24 hours after the procedure. Some gas. Mild cramping or bloating of your abdomen. Follow these instructions at home: Eating and drinking  Drink enough fluid to keep your urine pale yellow. Follow instructions from your health care provider about eating or drinking restrictions. Resume your normal  diet as instructed by your health care provider. Avoid heavy or fried foods that are hard to digest. Activity Rest as told by your health care provider. Avoid sitting for a long time without moving. Get up to take short walks every 1-2 hours. This is important to improve blood flow and breathing. Ask for help if you feel weak or unsteady. Return to your normal activities as told by your health care provider. Ask your health care provider what activities are safe for you. Managing cramping and bloating  Try walking around when you have cramps or feel bloated. Apply heat to your abdomen as told by your health care provider. Use the heat source that your health care provider recommends, such as a moist heat pack or a heating pad. Place a towel between your skin and the heat source. Leave the heat on for 20-30 minutes. Remove the heat if  your skin turns bright red. This is especially important if you are unable to feel pain, heat, or cold. You may have a greater risk of getting burned. General instructions If you were given a sedative during the procedure, it can affect you for several hours. Do not drive or operate machinery until your health care provider says that it is safe. For the first 24 hours after the procedure: Do not sign important documents. Do not drink alcohol. Do your regular daily activities at a slower pace than normal. Eat soft foods that are easy to digest. Take over-the-counter and prescription medicines only as told by your health care provider. Keep all follow-up visits as told by your health care provider. This is important. Contact a health care provider if: You have blood in your stool 2-3 days after the procedure. Get help right away if you have: More than a small spotting of blood in your stool. Large blood clots in your stool. Swelling of your abdomen. Nausea or vomiting. A fever. Increasing pain in your abdomen that is not relieved with medicine. Summary After  the procedure, it is common to have a small amount of blood in your stool. You may also have mild cramping and bloating of your abdomen. If you were given a sedative during the procedure, it can affect you for several hours. Do not drive or operate machinery until your health care provider says that it is safe. Get help right away if you have a lot of blood in your stool, nausea or vomiting, a fever, or increased pain in your abdomen. This information is not intended to replace advice given to you by your health care provider. Make sure you discuss any questions you have with your health care provider. Document Revised: 11/04/2018 Document Reviewed: 07/25/2018 Elsevier Patient Education  [place] After This sheet gives you information about how to care for yourself after your procedure. Your health care provider may also give you more specific instructions. If you have problems or questions, contact your health care provider. What can I expect after the procedure? After the procedure, it is common to have: Tiredness. Forgetfulness about what happened after the procedure. Impaired judgment for important decisions. Nausea or vomiting. Some difficulty with balance. Follow these instructions at home: For the time period you were told by your health care provider:   Rest as needed. Do not participate in activities where you could fall or become injured. Do not drive or use machinery. Do not drink alcohol. Do not take sleeping pills or medicines that cause drowsiness. Do not make important decisions or sign legal documents. Do not take care of children on your own. Eating and drinking Follow the diet that is recommended by your health care provider. Drink enough fluid to keep your urine pale yellow. If you vomit: Drink water, juice, or soup when you can drink without vomiting. Make sure you have little or no nausea before eating solid foods. General  instructions Have a responsible adult stay with you for the time you are told. It is important to have someone help care for you until you are awake and alert. Take over-the-counter and prescription medicines only as told by your health care provider. If you have sleep apnea, surgery and certain medicines can increase your risk for breathing problems. Follow instructions from your health care provider about wearing your sleep device: Anytime you are sleeping, including during daytime naps. While taking prescription pain medicines, sleeping medicines, or medicines  that make you drowsy. Avoid smoking. Keep all follow-up visits as told by your health care provider. This is important. Contact a health care provider if: You keep feeling nauseous or you keep vomiting. You feel light-headed. You are still sleepy or having trouble with balance after 24 hours. You develop a rash. You have a fever. You have redness or swelling around the IV site. Get help right away if: You have trouble breathing. You have new-onset confusion at home. Summary For several hours after your procedure, you may feel tired. You may also be forgetful and have poor judgment. Have a responsible adult stay with you for the time you are told. It is important to have someone help care for you until you are awake and alert. Rest as told. Do not drive or operate machinery. Do not drink alcohol or take sleeping pills. Get help right away if you have trouble breathing, or if you suddenly become confused. This information is not intended to replace advice given to you by your health care provider. Make sure you discuss any questions you have with your health care provider. Document Revised: 09/14/2019 Document Reviewed: 12/01/2018 Elsevier Patient Education  2022 [employer].

## 2021-02-12 ENCOUNTER — Other Ambulatory Visit (HOSPITAL_COMMUNITY): Payer: Medicaid Other

## 2021-02-13 ENCOUNTER — Encounter: Payer: Self-pay | Admitting: Cardiovascular Disease

## 2021-02-13 ENCOUNTER — Telehealth: Payer: Self-pay

## 2021-02-13 ENCOUNTER — Encounter (HOSPITAL_COMMUNITY)
Admission: RE | Admit: 2021-02-13 | Discharge: 2021-02-13 | Disposition: A | Discharge status: Home / Self Care | Payer: Medicaid Other | Source: Ambulatory Visit | Attending: Internal Medicine | Admitting: Internal Medicine

## 2021-02-13 NOTE — Telephone Encounter (Signed)
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 Pt is supposed to have colonoscopy on Monday 02/17/21 with Dr. Abbey Chatters. Pt was told she needed to reach out to cardiology to make sure she would be able to proceed with procedure.   I will request records from [facility] and have them faxed to provider for review.

## 2021-02-13 NOTE — Telephone Encounter (Signed)
Josue Hector, MD     Need report should not be an issue for colonoscopy

## 2021-02-13 NOTE — Pre-Procedure Instructions (Signed)
Called patient for her pre-op. She states that she has recently been to the ED in [place] and "they found an aneurysm in my chest. All they did was tell me to contact my heart doctor." She has not done this yet, cardiologist is Dr Johnsie Cancel. Dr Abbey Chatters notified and I told her I would call her back once I heard from him.

## 2021-02-13 NOTE — Telephone Encounter (Signed)
Waiting on note from [place]. Pt notified and verbalized understanding.

## 2021-02-14 ENCOUNTER — Encounter (HOSPITAL_COMMUNITY): Payer: Self-pay

## 2021-02-14 ENCOUNTER — Other Ambulatory Visit: Payer: Self-pay

## 2021-02-14 NOTE — Pre-Procedure Instructions (Signed)
°  Telephone 02/13/2021 Whiteface Buck, Ashley C, [place] Advice Only Reason for call   Conversation: Advice Only (Newest Message First) February 13, 2021 Berlinda Last, CMA      3:37 PM Note Waiting on note from Rafael Capo. Pt notified and verbalized understanding.      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 Pt is supposed to have colonoscopy on Monday 02/17/21 with Dr. Abbey Chatters. Pt was told she needed to reach out to cardiology to make sure she would be able to proceed with procedure.    I will request records from [facility] and have them faxed to provider for review.            11:23 AM [name] contacted Berlinda Last, CMA   Additional Documentation  Encounter Info:  Billing Info, History, Allergies, Detailed Report    Communications  View Encounter Conversation Summary Orders Placed  None Medication Renewals and Changes   None   Medication List   Visit Diagnoses   None   Problem List

## 2021-02-14 NOTE — Pre-Procedure Instructions (Signed)
Spoke with Dr Abbey Chatters who evaluated CT and messaged Dr. Johnsie Cancel, and patient is okay to proceed with procedure as planned on 02/17/2021. See Dr Kyla Balzarine note to office. I called patient and spoke with her about proceeding on 02/17/2021. She had no questions at this time.

## 2021-02-17 ENCOUNTER — Ambulatory Visit (HOSPITAL_COMMUNITY): Payer: Medicaid Other | Admitting: Anesthesiology

## 2021-02-17 ENCOUNTER — Encounter (HOSPITAL_COMMUNITY)
Admission: RE | Disposition: A | Discharge status: Home / Self Care | Payer: Self-pay | Source: Home / Self Care | Attending: Internal Medicine

## 2021-02-17 ENCOUNTER — Ambulatory Visit (HOSPITAL_COMMUNITY)
Admission: RE | Admit: 2021-02-17 | Discharge: 2021-02-17 | Disposition: A | Discharge status: Home / Self Care | Payer: Medicaid Other | Attending: Internal Medicine | Admitting: Internal Medicine

## 2021-02-17 ENCOUNTER — Encounter (HOSPITAL_COMMUNITY): Payer: Self-pay

## 2021-02-17 DIAGNOSIS — K2289 Other specified disease of esophagus: Secondary | ICD-10-CM

## 2021-02-17 DIAGNOSIS — K31A19 Gastric intestinal metaplasia without dysplasia, unspecified site: Secondary | ICD-10-CM | POA: Insufficient documentation

## 2021-02-17 DIAGNOSIS — K648 Other hemorrhoids: Secondary | ICD-10-CM | POA: Diagnosis not present

## 2021-02-17 DIAGNOSIS — K449 Diaphragmatic hernia without obstruction or gangrene: Secondary | ICD-10-CM | POA: Insufficient documentation

## 2021-02-17 DIAGNOSIS — R1310 Dysphagia, unspecified: Secondary | ICD-10-CM | POA: Insufficient documentation

## 2021-02-17 DIAGNOSIS — K2970 Gastritis, unspecified, without bleeding: Secondary | ICD-10-CM | POA: Diagnosis not present

## 2021-02-17 DIAGNOSIS — K625 Hemorrhage of anus and rectum: Secondary | ICD-10-CM | POA: Diagnosis present

## 2021-02-17 HISTORY — PX: COLONOSCOPY WITH PROPOFOL: SHX5780

## 2021-02-17 HISTORY — PX: ESOPHAGOGASTRODUODENOSCOPY (EGD) WITH PROPOFOL: SHX5813

## 2021-02-17 HISTORY — PX: BIOPSY: SHX5522

## 2021-02-17 HISTORY — PX: BALLOON DILATION: SHX5330

## 2021-02-17 LAB — GLUCOSE, CAPILLARY: Glucose-Capillary: 133 mg/dL — ABNORMAL HIGH (ref 70–99)

## 2021-02-17 SURGERY — COLONOSCOPY WITH PROPOFOL
Anesthesia: General

## 2021-02-17 MED ORDER — LIDOCAINE HCL (CARDIAC) PF 100 MG/5ML IV SOSY
PREFILLED_SYRINGE | INTRAVENOUS | Status: DC | PRN
Start: 2021-02-17 — End: 2021-02-17
  Administered 2021-02-17: 50 mg via INTRAVENOUS

## 2021-02-17 MED ORDER — LACTATED RINGERS IV SOLN: INTRAVENOUS | Status: DC

## 2021-02-17 MED ORDER — PROPOFOL 500 MG/50ML IV EMUL
INTRAVENOUS | Status: DC | PRN
  Administered 2021-02-17: 150 ug/kg/min via INTRAVENOUS

## 2021-02-17 MED ORDER — PROPOFOL 10 MG/ML IV BOLUS
INTRAVENOUS | Status: DC | PRN
Start: 2021-02-17 — End: 2021-02-17
  Administered 2021-02-17: 100 mg via INTRAVENOUS

## 2021-02-17 NOTE — Discharge Instructions (Addendum)
EGD Discharge instructions Please read the instructions outlined below and refer to this sheet in the next few weeks. These discharge instructions provide you with general information on caring for yourself after you leave the hospital. Your doctor may also give you specific instructions. While your treatment has been planned according to the most current medical practices available, unavoidable complications occasionally occur. If you have any problems or questions after discharge, please call your doctor. ACTIVITY You may resume your regular activity but move at a slower pace for the next 24 hours.  Take frequent rest periods for the next 24 hours.  Walking will help expel (get rid of) the air and reduce the bloated feeling in your abdomen.  No driving for 24 hours (because of the anesthesia (medicine) used during the test).  You may shower.  Do not sign any important legal documents or operate any machinery for 24 hours (because of the anesthesia used during the test).  NUTRITION Drink plenty of fluids.  You may resume your normal diet.  Begin with a light meal and progress to your normal diet.  Avoid alcoholic beverages for 24 hours or as instructed by your caregiver.  MEDICATIONS You may resume your normal medications unless your caregiver tells you otherwise.  WHAT YOU CAN EXPECT TODAY You may experience abdominal discomfort such as a feeling of fullness or gas pains.  FOLLOW-UP Your doctor will discuss the results of your test with you.  SEEK IMMEDIATE MEDICAL ATTENTION IF ANY OF THE FOLLOWING OCCUR: Excessive nausea (feeling sick to your stomach) and/or vomiting.  Severe abdominal pain and distention (swelling).  Trouble swallowing.  Temperature over 101 F (37.8 C).  Rectal bleeding or vomiting of blood.    Colonoscopy Discharge Instructions  Read the instructions outlined below and refer to this sheet in the next few weeks. These discharge instructions provide you with  general information on caring for yourself after you leave the hospital. Your doctor may also give you specific instructions. While your treatment has been planned according to the most current medical practices available, unavoidable complications occasionally occur.   ACTIVITY You may resume your regular activity, but move at a slower pace for the next 24 hours.  Take frequent rest periods for the next 24 hours.  Walking will help get rid of the air and reduce the bloated feeling in your belly (abdomen).  No driving for 24 hours (because of the medicine (anesthesia) used during the test).   Do not sign any important legal documents or operate any machinery for 24 hours (because of the anesthesia used during the test).  NUTRITION Drink plenty of fluids.  You may resume your normal diet as instructed by your doctor.  Begin with a light meal and progress to your normal diet. Heavy or fried foods are harder to digest and may make you feel sick to your stomach (nauseated).  Avoid alcoholic beverages for 24 hours or as instructed.  MEDICATIONS You may resume your normal medications unless your doctor tells you otherwise.  WHAT YOU CAN EXPECT TODAY Some feelings of bloating in the abdomen.  Passage of more gas than usual.  Spotting of blood in your stool or on the toilet paper.  IF YOU HAD POLYPS REMOVED DURING THE COLONOSCOPY: No aspirin products for 7 days or as instructed.  No alcohol for 7 days or as instructed.  Eat a soft diet for the next 24 hours.  FINDING OUT THE RESULTS OF YOUR TEST Not all test results are available  during your visit. If your test results are not back during the visit, make an appointment with your caregiver to find out the results. Do not assume everything is normal if you have not heard from your caregiver or the medical facility. It is important for you to follow up on all of your test results.  SEEK IMMEDIATE MEDICAL ATTENTION IF: You have more than a spotting of  blood in your stool.  Your belly is swollen (abdominal distention).  You are nauseated or vomiting.  You have a temperature over 101.  You have abdominal pain or discomfort that is severe or gets worse throughout the day.   Your EGD revealed mild amount inflammation in your stomach.  I took biopsies of this to rule out infection with a bacteria called H. pylori.  Await pathology results, my office will contact you. You also have a hiatal hernia and mucosal changes consistent with Barrett's esophagus. I also took biopsies of this are as well. I did stretch your esophagus today, hopefully this helps with your swallowing. Continue on Pantoprazole twice daily. If biopsies show Barrett's we will need to repeat EGD in 3 years.   Your colonoscopy was relatively unremarkable.  I did not find any polyps or evidence of colon cancer.  I recommend repeating colonoscopy in 10 years for colon cancer screening purposes.  You do have internal hemorrhoids. I would recommend increasing fiber in your diet or adding OTC Benefiber/Metamucil. Be sure to drink at least 4 to 6 glasses of water daily. We can schedule you for hemorrhoid banding if you are interested.   Otherwise follow up with GI in 4 months.    I hope you have a great rest of your week!  Elon Alas. Abbey Chatters, D.O. Gastroenterology and Hepatology [facility]

## 2021-02-17 NOTE — Op Note (Signed)
[facility] Patient Name: Sandra [name] Procedure Date: 02/17/2021 8:33 AM MRN: [id number] Date of Birth: 03/21/1975 Attending MD: Elon Alas. Abbey Chatters DO CSN: [id number] Age: 46 Admit Type: Outpatient Procedure:                Upper GI endoscopy Indications:              Dysphagia, Heartburn Providers:                Elon Alas. Abbey Chatters, DO, Hughie Closs RN, RN, Lambert Mody, Caprice Kluver, Lorna Dibble., Technician Referring MD:              Medicines:                See the Anesthesia note for documentation of the                            administered medications Complications:            No immediate complications. Estimated Blood Loss:     Estimated blood loss was minimal. Procedure:                Pre-Anesthesia Assessment:                           - The anesthesia plan was to use monitored                            anesthesia care (MAC).                           After obtaining informed consent, the endoscope was                            passed under direct vision. Throughout the                            procedure, the patient's blood pressure, pulse, and                            oxygen saturations were monitored continuously. The                            [id number]-H190 ([id number]) scope was introduced through the                            mouth, and advanced to the second part of duodenum.                            The upper GI endoscopy was accomplished without                            difficulty. The patient tolerated  the procedure                            well. Scope In: 4:58:59 AM Scope Out: 8:51:51 AM Total Procedure Duration: 0 hours 5 minutes 39 seconds  Findings:      A 2 cm hiatal hernia was present.      The esophagus and gastroesophageal junction were examined with white       light and narrow band imaging (NBI) from a forward view and retroflexed       position. There were  esophageal mucosal changes consistent with       short-segment Barrett's esophagus. These changes involved the mucosa at       the upper extent of the gastric folds (35 cm from the incisors)       extending to the Z-line (33 cm from the incisors). Circumferential       salmon-colored mucosa was present from 34 to 35 cm and two tongues of       salmon-colored mucosa were present from 34 to 35 cm. The maximum       longitudinal extent of these esophageal mucosal changes was 2 cm in       length. Biopsies were taken with a cold forceps for histology.      Patchy mild inflammation characterized by erythema was found in the       gastric body. Biopsies were taken with a cold forceps for Helicobacter       pylori testing.      The duodenal bulb, first portion of the duodenum and second portion of       the duodenum were normal.      No endoscopic abnormality was evident in the esophagus to explain the       patient's complaint of dysphagia. Preparations were made for empiric       dilation. A TTS dilator was passed through the scope. Dilation with an       18-19-20 mm balloon dilator was performed to 20 mm. Dilation was       performed with a mild resistance at 20 mm. Estimated blood loss was none. Impression:               - 2 cm hiatal hernia.                           - Esophageal mucosal changes consistent with                            short-segment Barrett's esophagus. Biopsied.                           - Gastritis. Biopsied.                           - Normal duodenal bulb, first portion of the                            duodenum and second portion of the duodenum. Moderate Sedation:      Per Anesthesia Care Recommendation:           - Patient has a contact number available for  emergencies. The signs and symptoms of potential                            delayed complications were discussed with the                            patient. Return to normal  activities tomorrow.                            Written discharge instructions were provided to the                            patient.                           - Resume previous diet.                           - Continue present medications.                           - Await pathology results.                           - Repeat upper endoscopy in 3 years for                            surveillance based on pathology results.                           - Return to GI clinic in 4 months.                           - Use a proton pump inhibitor PO BID.                           - No ibuprofen, naproxen, or other non-steroidal                            anti-inflammatory drugs. Procedure Code(s):        --- Professional ---                           [phone], Esophagogastroduodenoscopy, flexible,                            transoral; with biopsy, single or multiple Diagnosis Code(s):        --- Professional ---                           K44.9, Diaphragmatic hernia without obstruction or                            gangrene                           K22.8, Other specified diseases of esophagus  K29.70, Gastritis, unspecified, without bleeding                           R13.10, Dysphagia, unspecified                           R12, Heartburn CPT copyright 2019 American Medical Association. All rights reserved. The codes documented in this report are preliminary and upon coder review may  be revised to meet current compliance requirements. Elon Alas. Abbey Chatters, DO [place] Abbey Chatters, DO 02/17/2021 8:55:24 AM This report has been signed electronically. Number of Addenda: 0

## 2021-02-17 NOTE — Op Note (Signed)
[facility] Patient Name: Sandra [name] Procedure Date: 02/17/2021 8:37 AM MRN: [id number] Date of Birth: [birth year] Attending MD: Elon Alas. Abbey Chatters DO CSN: [id number] Age: 46 Admit Type: Outpatient Procedure:                Colonoscopy Indications:              Rectal bleeding Providers:                Elon Alas. Abbey Chatters, DO, Hughie Closs RN, RN, Caprice Kluver, Lambert Mody, Thomas Hoff.,                            Technician, Aram Candela Referring MD:              Medicines:                See the Anesthesia note for documentation of the                            administered medications Complications:            No immediate complications. Estimated Blood Loss:     Estimated blood loss: none. Procedure:                Pre-Anesthesia Assessment:                           - The anesthesia plan was to use monitored                            anesthesia care (MAC).                           After obtaining informed consent, the colonoscope                            was passed under direct vision. Throughout the                            procedure, the patient's blood pressure, pulse, and                            oxygen saturations were monitored continuously. The                            [id number]-HQ190L ([id number]) scope was introduced through                            the anus and advanced to the the cecum, identified                            by appendiceal orifice and ileocecal valve. The                            colonoscopy was performed without difficulty. The  patient tolerated the procedure well. The quality                            of the bowel preparation was evaluated using the                            BBPS [facility] Bowel Preparation Scale) with scores                            of: Right Colon = 2 (minor amount of residual                            staining, small fragments of stool and/or opaque                             liquid, but mucosa seen well), Transverse Colon = 2                            (minor amount of residual staining, small fragments                            of stool and/or opaque liquid, but mucosa seen                            well) and Left Colon = 2 (minor amount of residual                            staining, small fragments of stool and/or opaque                            liquid, but mucosa seen well). The total BBPS score                            equals 6. The quality of the bowel preparation was                            fair. Scope In: 8:56:08 AM Scope Out: 9:05:49 AM Scope Withdrawal Time: 0 hours 7 minutes 37 seconds  Total Procedure Duration: 0 hours 9 minutes 41 seconds  Findings:      Hemorrhoids were found on perianal exam.      Non-bleeding internal hemorrhoids were found during endoscopy.      The exam was otherwise without abnormality. Impression:               - Preparation of the colon was fair.                           - Hemorrhoids found on perianal exam.                           - Non-bleeding internal hemorrhoids.                           - The examination was  otherwise normal.                           - No specimens collected. Moderate Sedation:      Per Anesthesia Care Recommendation:           - Patient has a contact number available for                            emergencies. The signs and symptoms of potential                            delayed complications were discussed with the                            patient. Return to normal activities tomorrow.                            Written discharge instructions were provided to the                            patient.                           - Resume previous diet.                           - Continue present medications.                           - Repeat colonoscopy in 10 years for screening                            purposes.                           - Return to  GI clinic in 4 months.                           - Consider hemorrhoid banding if rectal bleeding                            continues despite conservative measures. Procedure Code(s):        --- Professional ---                           [phone], Colonoscopy, flexible; diagnostic, including                            collection of specimen(s) by brushing or washing,                            when performed (separate procedure) Diagnosis Code(s):        --- Professional ---                           Q11.9, Other hemorrhoids  K62.5, Hemorrhage of anus and rectum CPT copyright 2019 American Medical Association. All rights reserved. The codes documented in this report are preliminary and upon coder review may  be revised to meet current compliance requirements. Elon Alas. Abbey Chatters, DO [place] Abbey Chatters, DO 02/17/2021 9:09:57 AM This report has been signed electronically. Number of Addenda: 0

## 2021-02-17 NOTE — Anesthesia Preprocedure Evaluation (Signed)
Anesthesia Evaluation  Patient identified by MRN, date of birth, ID band Patient awake    Reviewed: Allergy & Precautions, H&P , NPO status , Patient's Chart, lab work & pertinent test results, reviewed documented beta blocker date and time   Airway Mallampati: II  TM Distance: >3 FB Neck ROM: full    Dental no notable dental hx.    Pulmonary neg pulmonary ROS, former smoker,    Pulmonary exam normal breath sounds clear to auscultation       Cardiovascular Exercise Tolerance: Good hypertension, +CHF   Rhythm:regular Rate:Normal     Neuro/Psych PSYCHIATRIC DISORDERS Anxiety Depression Bipolar Disorder  Neuromuscular disease    GI/Hepatic Neg liver ROS, GERD  Medicated,  Endo/Other  negative endocrine ROSdiabetes, Type 2  Renal/GU negative Renal ROS  negative genitourinary   Musculoskeletal   Abdominal   Peds  Hematology  (+) Blood dyscrasia, anemia ,   Anesthesia Other Findings   Reproductive/Obstetrics negative OB ROS                             Anesthesia Physical Anesthesia Plan  ASA: 3  Anesthesia Plan: General   Post-op Pain Management:    Induction:   PONV Risk Score and Plan: Propofol infusion  Airway Management Planned:   Additional Equipment:   Intra-op Plan:   Post-operative Plan:   Informed Consent: I have reviewed the patients History and Physical, chart, labs and discussed the procedure including the risks, benefits and alternatives for the proposed anesthesia with the patient or authorized representative who has indicated his/her understanding and acceptance.     Dental Advisory Given  Plan Discussed with: CRNA  Anesthesia Plan Comments:         Anesthesia Quick Evaluation

## 2021-02-17 NOTE — Anesthesia Postprocedure Evaluation (Signed)
Anesthesia Post Note  Patient: Sandra [name]  Procedure(s) Performed: COLONOSCOPY WITH PROPOFOL ESOPHAGOGASTRODUODENOSCOPY (EGD) WITH PROPOFOL BALLOON DILATION BIOPSY  Patient location during evaluation: Phase II Anesthesia Type: General Level of consciousness: awake Pain management: pain level controlled Vital Signs Assessment: post-procedure vital signs reviewed and stable Respiratory status: spontaneous breathing and respiratory function stable Cardiovascular status: blood pressure returned to baseline and stable Postop Assessment: no headache and no apparent nausea or vomiting Anesthetic complications: no Comments: Late entry   No notable events documented.   Last Vitals:  Vitals:   02/17/21 0910 02/17/21 0913  BP: (!) 106/56 120/73  Pulse: 69 69  Resp: 15 16  Temp: 36.7 C   SpO2: 99% 100%    Last Pain:  Vitals:   02/17/21 0913  TempSrc:   PainSc: [place]

## 2021-02-17 NOTE — Interval H&P Note (Signed)
History and Physical Interval Note:  02/17/2021 8:33 AM  Sandra [name]  has presented today for surgery, with the diagnosis of dysphagia, RB, gerd, hemorrhoids, constipation.  The various methods of treatment have been discussed with the patient and family. After consideration of risks, benefits and other options for treatment, the patient has consented to  Procedure(s) with comments: COLONOSCOPY WITH PROPOFOL (N/A) - 8:30am ESOPHAGOGASTRODUODENOSCOPY (EGD) WITH PROPOFOL (N/A) BALLOON DILATION (N/A) as a surgical intervention.  The patient's history has been reviewed, patient examined, no change in status, stable for surgery.  I have reviewed the patient's chart and labs.  Questions were answered to the patient's satisfaction.     Eloise Harman

## 2021-02-17 NOTE — Transfer of Care (Signed)
Immediate Anesthesia Transfer of Care Note  Patient: Sandra [name]  Procedure(s) Performed: COLONOSCOPY WITH PROPOFOL ESOPHAGOGASTRODUODENOSCOPY (EGD) WITH PROPOFOL BALLOON DILATION BIOPSY  Patient Location: Short Stay  Anesthesia Type:General  Level of Consciousness: drowsy  Airway & Oxygen Therapy: Patient Spontanous Breathing  Post-op Assessment: Report given to RN and Post -op Vital signs reviewed and stable  Post vital signs: Reviewed and stable  Last Vitals:  Vitals Value Taken Time  BP    Temp    Pulse    Resp    SpO2      Last Pain:  Vitals:   02/17/21 0841  TempSrc:   PainSc: 8       Patients Stated Pain Goal: 7 (22/63/33 5456)  Complications: No notable events documented.

## 2021-02-18 LAB — SURGICAL PATHOLOGY

## 2021-02-19 ENCOUNTER — Encounter (HOSPITAL_COMMUNITY): Payer: Self-pay | Admitting: Internal Medicine

## 2021-02-19 ENCOUNTER — Telehealth: Payer: Self-pay | Admitting: Cardiovascular Disease

## 2021-02-19 NOTE — Telephone Encounter (Signed)
[facility] advised pt that she has an aneurysm in her chest  patient wanted to make [place] aware

## 2021-02-19 NOTE — Telephone Encounter (Signed)
Noted. Request for records sent.

## 2021-02-19 NOTE — Progress Notes (Signed)
Cardiology Office Note:    Date:  02/20/2021   ID:  Sandra [name], DOB [birth year], MRN [id number]  PCP:  Sandra [name], [facility] Cardiologist:  Sandra Rouge, MD     Referring MD: Sandra Breeze, FNP   Chief Complaint:  No chief complaint on file.     Patient Profile:   Sandra [name] is a 46 y.o. female with:  Coronary artery disease  Cath 5/22:  diffuse small vessel disease (diabetic); p-mRCA 70; occluded PDA w/ L-R collats; OM2 60 >> med Rx Myoview 6/22 ([facility]): no ischemia, EF 34 Myoview 2/22: no ischemia, EF 45, ?inf infarct  Echocardiogram 3/22: inf HK-AK, EF 50-55 Unable to get cMRI due to interstim implant  Hypertension  Hyperlipidemia  Diabetes mellitus  ADHD Bipolar d/o Bladder CA S/p interstim implant  GERD  Husband died due to COVID-19 in Sep 29, 2022 Allergic to ACEi (urticaria with benazepril)   History of Present Illness: Sandra [name]  has small vessel diabetic disease with mod prox to mid RCA, OM2 disease and a chronically occluded PDA and L-R collaterals.  Her chest pain is managed medically.   Seen in [place] ER 02/07/21 She was concerned about aortic aneurysm but review of scan only shows mild aortic ectasia 3.9 cm  She has not smoked in 22 days and I congratulated her on this [facility] ER diagnosed with viral gastroenteritis    Prior CV studies: Myoview 06/21/20 ([facility]) No evidence of inducible myocardial ischemia or myocardial infarction. LVEF 34%.    Echocardiogram 06/19/20 ([facility]) EF 43, mild LVH, mild RV systolic dysfunction, mild MR, mild TR, trivial TR   LEFT HEART CATH AND CORONARY ANGIOGRAPHY 05/31/2020 RCA proximal 30, mid 70, distal 50; RPDA 100 LCx proximal 40, mid 20; OM1 30, OM2 60 LAD proximal 20, mid 82 RI 30 you to thank you    Echocardiogram 03/27/20 Severe inferior HK/AK, EF 50-55, mild LVH, normal RVSF, trivial MR   NM Myocar Multi W/Spect W/Wall Motion / EF 03/07/2020 No large ischemia, EF 45, low risk   Past  Medical History:  Diagnosis Date   ADHD (attention deficit hyperactivity disorder)    Allergic rhinitis    Anemia    hx of   Bipolar 1 disorder (HCC)    Bladder tumor    CAD (coronary artery disease)    Cancer (HCC)    bladder   Dilated cardiomyopathy ([place]) 12/20/2020   Echo 6/22 ([facility]): EF 43, mild MR, mild TR // Echo 3/22: EF 50-55   Dizziness    Full dentures    GERD (gastroesophageal reflux disease)    Gross hematuria    Hyperlipidemia    Hypertension    Stable angina (HCC)    Type 2 diabetes mellitus (HCC)    Urgency of urination    dysuria, sui   Wears glasses    Current Medications: Current Meds  Medication Sig   ACCU-CHEK GUIDE test strip USE AS INSTRUCTED TO MONITOR GLUCOSE 4 TIMES DAILY BEFORE MEALS AND BEFORE BED.   Accu-Chek Softclix Lancets lancets Use as instructed to monitor glucose 4 times daily   aspirin EC 81 MG tablet Take 1 tablet (81 mg total) by mouth daily. Swallow whole.   Blood Glucose Monitoring Suppl (ACCU-CHEK GUIDE) w/Device KIT 1 Piece by Does not apply route as directed.   Blood Pressure Monitoring (BLOOD PRESSURE CUFF) MISC See admin instructions.   buPROPion (WELLBUTRIN XL) 300 MG 24 hr tablet Take 300 mg by mouth every  morning.   cloNIDine HCl (KAPVAY) 0.1 MG TB12 ER tablet 1 tablet at bedtime.   Continuous Blood Gluc Sensor (DEXCOM G6 SENSOR) MISC Apply new sensor every 10 days as directed   Continuous Blood Gluc Transmit (DEXCOM G6 TRANSMITTER) MISC 1 Device by Does not apply route every 3 (three) months.   diclofenac (VOLTAREN) 75 MG EC tablet Take 75 mg by mouth 2 (two) times daily.   Evolocumab (REPATHA SURECLICK) 967 MG/ML SOAJ Inject 1 Dose into the skin every 14 (fourteen) days.   fenofibrate (TRICOR) 145 MG tablet Take 145 mg by mouth daily.   FLUoxetine (PROZAC) 20 MG capsule Take 20 mg by mouth daily. Takes  at bedtime   FLUoxetine (PROZAC) 40 MG capsule Take 40 mg by mouth daily.   gabapentin (NEURONTIN) 300 MG capsule TAKE 1  CAPSULE(300 MG) BY MOUTH THREE TIMES DAILY   glipiZIDE (GLUCOTROL XL) 5 MG 24 hr tablet Take 1 tablet (5 mg total) by mouth daily with breakfast.   hydrocortisone (ANUSOL-HC) 2.5 % rectal cream Place 1 application rectally 2 (two) times daily.   insulin aspart (NOVOLOG) 100 UNIT/ML FlexPen Inject 10-16 Units into the skin 3 (three) times daily with meals.   insulin glargine (LANTUS) 100 UNIT/ML Solostar Pen Inject 30 Units into the skin at bedtime.   Insulin Pen Needle (PEN NEEDLES) 33G X 4 MM MISC 1 each by Does not apply route in the morning, at noon, in the evening, and at bedtime.   metFORMIN (GLUCOPHAGE) 1000 MG tablet 2 tablet with meals   metoprolol succinate (TOPROL-XL) 100 MG 24 hr tablet Take 1 tablet (100 mg total) by mouth daily. Take with or immediately following a meal.   montelukast (SINGULAIR) 10 MG tablet Take 10 mg by mouth at bedtime.   nitroGLYCERIN (NITROSTAT) 0.4 MG SL tablet Place 1 tablet (0.4 mg total) under the tongue every 5 (five) minutes as needed for chest pain.   pantoprazole (PROTONIX) 40 MG tablet Take 1 tablet (40 mg total) by mouth 2 (two) times daily before a meal.   rosuvastatin (CRESTOR) 40 MG tablet Take 40 mg by mouth daily.   VYVANSE 40 MG capsule Take 40 mg by mouth every morning.   Current Facility-Administered Medications for the 02/20/21 encounter (Appointment) with Sandra Hector, MD  Medication   betamethasone acetate-betamethasone sodium phosphate (CELESTONE) injection 3 mg    Allergies:   Ibuprofen, Benazepril, Diphenhydramine, Metronidazole, Oxycodone, Adhesive [tape], Codeine, Loratadine, Nystatin-triamcinolone, and Penicillins   Social History   Tobacco Use   Smoking status: Former    Packs/day: 0.50    Years: 10.00    Pack years: 5.00    Types: Cigarettes    Quit date: 01/31/2021    Years since quitting: 0.0   Smokeless tobacco: Never   Tobacco comments:    02/06/21-no smoking past 5 days  Vaping Use   Vaping Use: Never used   Substance Use Topics   Alcohol use: No   Drug use: No    Family Hx: The patient's family history includes Breast cancer in her maternal aunt; Cancer in her mother and another family member; Diabetes in her daughter; Hypertension in her mother; Lung cancer in her maternal uncle. There is no history of Colon cancer.  ROS see HPI  EKGs/Labs/Other Test Reviewed:    EKG:  EKG is  ordered today.  The ekg ordered today demonstrates NSR, HR 74, normal axis, no ST-T wave changes, QTC 475  Recent Labs: 01/23/2021: ALT 19; BUN 16; Creatinine,  Ser 0.84; Hemoglobin 12.3; Platelets 425; Potassium 3.9; Sodium 134   Recent Lipid Panel Lab Results  Component Value Date/Time   CHOL 246 (H) 06/05/2020 08:53 AM   TRIG 273 (H) 06/05/2020 08:53 AM   HDL 31 (L) 06/05/2020 08:53 AM   LDLCALC 163 (H) 06/05/2020 08:53 AM     Risk Assessment/Calculations:          Physical Exam:    VS:  There were no vitals taken for this visit.    Wt Readings from Last 3 Encounters:  02/14/21 173 lb 6.4 oz (78.7 kg)  02/06/21 173 lb 6.4 oz (78.7 kg)  01/23/21 146 lb (66.2 kg)    Affect appropriate Healthy:  appears stated age HEENT: normal xanthelasma  Neck supple with no adenopathy JVP normal no bruits no thyromegaly Lungs COPD with mild exp wheezing  Heart:  S1/S2 no murmur, no rub, gallop or click PMI normal Abdomen: benighn, BS positve, no tenderness, no AAA no bruit.  No HSM or HJR Distal pulses intact with no bruits No edema Neuro non-focal Skin warm and dry No muscular weakness   Plan:  Familial hypercholesterolemia She currently remains on rosuvastatin 40 mg daily, fenofibrate 145 mg daily, evolocumab 140 mg every 14 days.  Imporoved with LDL particle # 1216 and LDL 99 F/U Dr Julieanne Manson    CAD (coronary artery disease) Cardiac catheterization in 5/22 with diffuse small vessel diabetic disease.  She has moderate disease in the proximal to mid RCA and occluded PDA with left-to-right collaterals  as well as moderate disease in OM 2.  She has been managed medically.  She has fairly stable anginal symptoms.  However, she does have CCS class II-III symptoms.  I have recommended that we continue to titrate her medications further.  Increase metoprolol succinate to 100 mg daily.  Continue amlodipine 5 mg daily, isosorbide mononitrate 60 mg daily, rosuvastatin 40 mg daily, aspirin 81 mg daily.     Essential hypertension, benign Blood pressure is well controlled on her current medical regimen which includes amlodipine, clonidine, isosorbide mononitrate, metoprolol succinate.   Dilated cardiomyopathy ([place]) EF 43 by echocardiogram at [facility] in June 2022.  She is NYHA II.  Volume status currently stable.  Metoprolol succinate to 100 mg daily  Continue isosorbide mononitrate 60 mg daily.  F/U echo March June 2023   Aortic Ectasia:  not significant only 3.9 cm by CT can evaluate root on echo June 2023     Medication Adjustments/Labs and Tests Ordered: Current medicines are reviewed at length with the patient today.  Concerns regarding medicines are outlined above.  Tests Ordered: No orders of the defined types were placed in this encounter. Echo June 2023  Medication Changes: No orders of the defined types were placed in this encounter.  Signed, Sandra Rouge, MD  02/20/2021 9:03 AM    [address], [place], [place]  [address] Phone: [phone]; Fax: [phone]

## 2021-02-20 ENCOUNTER — Encounter: Payer: Self-pay | Admitting: Cardiovascular Disease

## 2021-02-20 ENCOUNTER — Ambulatory Visit: Payer: Medicaid Other | Admitting: Cardiovascular Disease

## 2021-02-20 ENCOUNTER — Other Ambulatory Visit: Payer: Self-pay

## 2021-02-20 ENCOUNTER — Ambulatory Visit (INDEPENDENT_AMBULATORY_CARE_PROVIDER_SITE_OTHER): Payer: Medicaid Other | Admitting: Cardiovascular Disease

## 2021-02-20 VITALS — BP 144/92 | HR 80 | Ht 60.0 in | Wt 172.8 lb

## 2021-02-20 DIAGNOSIS — I77819 Aortic ectasia, unspecified site: Secondary | ICD-10-CM | POA: Diagnosis not present

## 2021-02-20 DIAGNOSIS — I2510 Atherosclerotic heart disease of native coronary artery without angina pectoris: Secondary | ICD-10-CM | POA: Diagnosis not present

## 2021-02-20 DIAGNOSIS — I255 Ischemic cardiomyopathy: Secondary | ICD-10-CM

## 2021-02-20 NOTE — Patient Instructions (Signed)
Testing/Procedures: Your physician has requested that you have an echocardiogram. Echocardiography is a painless test that uses sound waves to create images of your heart. It provides your doctor with information about the size and shape of your heart and how well your hearts chambers and valves are working. This procedure takes approximately one hour. There are no restrictions for this procedure.   Follow-Up: Follow up with Dr Johnsie Cancel in 6 months.   Any Other Special Instructions Will Be Listed Below (If Applicable).     If you need a refill on your cardiac medications before your next appointment, please call your pharmacy.

## 2021-02-24 ENCOUNTER — Other Ambulatory Visit: Payer: Self-pay

## 2021-02-24 ENCOUNTER — Encounter: Payer: Self-pay | Admitting: Nurse Practitioner

## 2021-02-24 ENCOUNTER — Ambulatory Visit (INDEPENDENT_AMBULATORY_CARE_PROVIDER_SITE_OTHER): Payer: Medicaid Other | Admitting: Nurse Practitioner

## 2021-02-24 VITALS — BP 144/92 | HR 90 | Ht 60.0 in | Wt 172.0 lb

## 2021-02-24 DIAGNOSIS — I10 Essential (primary) hypertension: Secondary | ICD-10-CM

## 2021-02-24 DIAGNOSIS — E1165 Type 2 diabetes mellitus with hyperglycemia: Secondary | ICD-10-CM

## 2021-02-24 DIAGNOSIS — E782 Mixed hyperlipidemia: Secondary | ICD-10-CM | POA: Diagnosis not present

## 2021-02-24 LAB — POCT GLYCOSYLATED HEMOGLOBIN (HGB A1C): HbA1c, POC (controlled diabetic range): 7.2 % — AB (ref 0.0–7.0)

## 2021-02-24 MED ORDER — METFORMIN HCL 1000 MG PO TABS: ORAL_TABLET | ORAL | 3 refills | Status: DC

## 2021-02-24 MED ORDER — INSULIN GLARGINE 100 UNIT/ML SOLOSTAR PEN: 30.0000 [IU] | PEN_INJECTOR | Freq: Every day | SUBCUTANEOUS | 3 refills | Status: DC

## 2021-02-24 MED ORDER — GLIPIZIDE ER 5 MG PO TB24: 5.0000 mg | ORAL_TABLET | Freq: Every day | ORAL | 3 refills | Status: DC

## 2021-02-24 MED ORDER — INSULIN ASPART 100 UNIT/ML FLEXPEN: 10.0000 [IU] | PEN_INJECTOR | Freq: Three times a day (TID) | SUBCUTANEOUS | 3 refills | Status: DC

## 2021-02-24 MED ORDER — PEN NEEDLES 31G X 6 MM MISC: 11 refills | Status: AC

## 2021-02-24 NOTE — Patient Instructions (Signed)

## 2021-02-24 NOTE — Progress Notes (Signed)
02/24/2021, 3:06 PM                   Endocrinology follow-up note   Subjective:    Patient ID: Sandra [name], female    DOB: [birth year].  Sandra [name] is being seen in follow-up  for management of currently uncontrolled symptomatic type 2  diabetes requested by  Coolidge Breeze, FNP.   Past Medical History:  Diagnosis Date   ADHD (attention deficit hyperactivity disorder)    Allergic rhinitis    Anemia    hx of   Bipolar 1 disorder (HCC)    Bladder tumor    CAD (coronary artery disease)    Cancer ([place])    bladder   Dilated cardiomyopathy ([place]) 12/20/2020   Echo 6/22 ([facility]): EF 43, mild MR, mild TR // Echo 3/22: EF 50-55   Dizziness    Full dentures    GERD (gastroesophageal reflux disease)    Gross hematuria    Hyperlipidemia    Hypertension    Stable angina (HCC)    Type 2 diabetes mellitus ([place])    Urgency of urination    dysuria, sui   Wears glasses     Past Surgical History:  Procedure Laterality Date   BALLOON DILATION N/A 02/17/2021   Procedure: BALLOON DILATION;  Surgeon: Eloise Harman, DO;  [facility];  Service: Endoscopy;  Laterality: N/A;   BIOPSY  02/17/2021   Procedure: BIOPSY;  Surgeon: Eloise Harman, DO;  [facility];  Service: Endoscopy;;   COLONOSCOPY WITH PROPOFOL N/A 02/17/2021   Procedure: COLONOSCOPY WITH PROPOFOL;  Surgeon: Eloise Harman, DO;  [facility];  Service: Endoscopy;  Laterality: N/A;  8:30am   CYSTOSCOPY N/A 10/03/2015   Procedure: CYSTOSCOPY;  Surgeon: Irine Seal, MD;  Location: [facility];  Service: Urology;  Laterality: N/A;   CYSTOSCOPY WITH STENT PLACEMENT Right 10/31/2015   Procedure: CYSTOSCOPY WITH ATTEMPTED RIGHT URETERAL OPENING;  Surgeon: Irine Seal, MD;  Location: [facility];  Service: Urology;  Laterality: Right;   ESOPHAGOGASTRODUODENOSCOPY (EGD) WITH PROPOFOL N/A  02/17/2021   Procedure: ESOPHAGOGASTRODUODENOSCOPY (EGD) WITH PROPOFOL;  Surgeon: Eloise Harman, DO;  [facility];  Service: Endoscopy;  Laterality: N/A;   FOOT SURGERY Left    September 2022, broke 7 bones in left foot   INTERSTIM IMPLANT PLACEMENT N/A 03/14/2018   Procedure: Barrie Lyme IMPLANT FIRST STAGE;  Surgeon: Cleon Gustin, MD;  Location: [facility];  Service: Urology;  Laterality: N/A;   INTERSTIM IMPLANT PLACEMENT N/A 03/30/2018   Procedure: Barrie Lyme IMPLANT SECOND STAGE;  Surgeon: Cleon Gustin, MD;  Location: [facility];  Service: Urology;  Laterality: N/A;   LEFT HEART CATH AND CORONARY ANGIOGRAPHY N/A 05/31/2020   Procedure: LEFT HEART CATH AND CORONARY ANGIOGRAPHY;  Surgeon: Troy Sine, MD;  Location: [facility];  Service: Cardiovascular;  Laterality: N/A;   MULTIPLE TOOTH EXTRACTIONS  2015   OVARIAN CYST REMOVAL Right 2004 approx   SHOULDER OPEN ROTATOR CUFF REPAIR Left 04/20/2019   Procedure: ROTATOR CUFF REPAIR SHOULDER OPEN;  Surgeon: Carole Civil, MD;  Location: [facility];  Service: Orthopedics;  Laterality: Left;   TONSILLECTOMY  12/30/2004   TOTAL ABDOMINAL HYSTERECTOMY W/ BILATERAL SALPINGOOPHORECTOMY  05/16/2004   TRANSURETHRAL RESECTION OF BLADDER TUMOR N/A 10/03/2015   Procedure: TRANSURETHRAL RESECTION OF BLADDER TUMOR (TURBT);  Surgeon: Irine Seal, MD;  Location: [facility];  Service: Urology;  Laterality: N/A;   TRANSURETHRAL RESECTION OF BLADDER TUMOR N/A 10/31/2015   Procedure: RE-STAGINGG TRANSURETHRAL RESECTION OF BLADDER TUMOR (TURBT);  Surgeon: Irine Seal, MD;  [facility];  Service: Urology;  Laterality: N/A;   TRANSURETHRAL RESECTION OF BLADDER TUMOR N/A 08/17/2016   Procedure: TRANSURETHRAL RESECTION OF BLADDER TUMOR (TURBT);  Surgeon: Cleon Gustin, MD;  Location[facility];  Service: Urology;  Laterality: N/A;    Social History   Socioeconomic History   Marital status:  Married    Spouse name: Not on file   Number of children: Not on file   Years of education: Not on file   Highest education level: Not on file  Occupational History   Not on file  Tobacco Use   Smoking status: Former    Packs/day: 0.50    Years: 10.00    Pack years: 5.00    Types: Cigarettes    Quit date: 01/31/2021    Years since quitting: 0.0   Smokeless tobacco: Never   Tobacco comments:    02/06/21-no smoking past 5 days  Vaping Use   Vaping Use: Never used  Substance and Sexual Activity   Alcohol use: No   Drug use: No   Sexual activity: Not Currently    Birth control/protection: Surgical    Comment: hyst  Other Topics Concern   Not on file  Social History Narrative   Not on file   Social Determinants of Health   Financial Resource Strain: Not on file  Food Insecurity: Not on file  Transportation Needs: Not on file  Physical Activity: Not on file  Stress: Not on file  Social Connections: Not on file    Family History  Problem Relation Age of Onset   Hypertension Mother    Cancer Mother    Breast cancer Maternal Aunt    Lung cancer Maternal Uncle    Cancer Other    Diabetes Daughter    Colon cancer Neg Hx     Outpatient Encounter Medications as of 02/24/2021  Medication Sig   ACCU-CHEK GUIDE test strip USE AS INSTRUCTED TO MONITOR GLUCOSE 4 TIMES DAILY BEFORE MEALS AND BEFORE BED.   Accu-Chek Softclix Lancets lancets Use as instructed to monitor glucose 4 times daily   aspirin EC 81 MG tablet Take 1 tablet (81 mg total) by mouth daily. Swallow whole.   Blood Glucose Monitoring Suppl (ACCU-CHEK GUIDE) w/Device KIT 1 Piece by Does not apply route as directed.   Blood Pressure Monitoring (BLOOD PRESSURE CUFF) MISC See admin instructions.   buPROPion (WELLBUTRIN XL) 300 MG 24 hr tablet Take 300 mg by mouth every morning.   cloNIDine HCl (KAPVAY) 0.1 MG TB12 ER tablet 1 tablet at bedtime.   Continuous Blood Gluc Sensor (DEXCOM G6 SENSOR) MISC Apply new sensor  every 10 days as directed   Continuous Blood Gluc Transmit (DEXCOM G6 TRANSMITTER) MISC 1 Device by Does not apply route every 3 (three) months.   Dexlansoprazole 30 MG capsule DR Take 30 mg by mouth daily.   diclofenac (VOLTAREN) 75 MG EC tablet Take 75 mg by mouth 2 (two) times daily.   Evolocumab (REPATHA SURECLICK) 628 MG/ML SOAJ  Inject 1 Dose into the skin every 14 (fourteen) days.   fenofibrate (TRICOR) 145 MG tablet Take 145 mg by mouth daily.   FLUoxetine (PROZAC) 20 MG capsule Take 20 mg by mouth daily. Takes  at bedtime   FLUoxetine (PROZAC) 40 MG capsule Take 40 mg by mouth daily.   gabapentin (NEURONTIN) 300 MG capsule TAKE 1 CAPSULE(300 MG) BY MOUTH THREE TIMES DAILY   hydrocortisone (ANUSOL-HC) 2.5 % rectal cream Place 1 application rectally 2 (two) times daily.   Insulin Pen Needle (PEN NEEDLES) 31G X 6 MM MISC Use to inject insulin 4 times daily   metoprolol succinate (TOPROL-XL) 100 MG 24 hr tablet Take 1 tablet (100 mg total) by mouth daily. Take with or immediately following a meal.   montelukast (SINGULAIR) 10 MG tablet Take 10 mg by mouth at bedtime.   nitroGLYCERIN (NITROSTAT) 0.4 MG SL tablet Place 1 tablet (0.4 mg total) under the tongue every 5 (five) minutes as needed for chest pain.   pantoprazole (PROTONIX) 40 MG tablet Take 1 tablet (40 mg total) by mouth 2 (two) times daily before a meal.   rosuvastatin (CRESTOR) 40 MG tablet Take 40 mg by mouth daily.   VYVANSE 40 MG capsule Take 40 mg by mouth every morning.   [DISCONTINUED] glipiZIDE (GLUCOTROL XL) 5 MG 24 hr tablet Take 1 tablet (5 mg total) by mouth daily with breakfast.   [DISCONTINUED] insulin aspart (NOVOLOG) 100 UNIT/ML FlexPen Inject 10-16 Units into the skin 3 (three) times daily with meals.   [DISCONTINUED] insulin glargine (LANTUS) 100 UNIT/ML Solostar Pen Inject 30 Units into the skin at bedtime.   [DISCONTINUED] Insulin Pen Needle (PEN NEEDLES) 33G X 4 MM MISC 1 each by Does not apply route in the  morning, at noon, in the evening, and at bedtime.   [DISCONTINUED] metFORMIN (GLUCOPHAGE) 1000 MG tablet 2 tablet with meals   amLODipine (NORVASC) 5 MG tablet Take 1 tablet (5 mg total) by mouth daily.   glipiZIDE (GLUCOTROL XL) 5 MG 24 hr tablet Take 1 tablet (5 mg total) by mouth daily with breakfast.   insulin aspart (NOVOLOG) 100 UNIT/ML FlexPen Inject 10-16 Units into the skin 3 (three) times daily with meals.   insulin glargine (LANTUS) 100 UNIT/ML Solostar Pen Inject 30 Units into the skin at bedtime.   isosorbide mononitrate (IMDUR) 60 MG 24 hr tablet Take 1 tablet (60 mg total) by mouth daily.   metFORMIN (GLUCOPHAGE) 1000 MG tablet 2 tablet with meals   Facility-Administered Encounter Medications as of 02/24/2021  Medication   betamethasone acetate-betamethasone sodium phosphate (CELESTONE) injection 3 mg    ALLERGIES: Allergies  Allergen Reactions   Ibuprofen Hives    itching itching itching   Benazepril Hives   Diphenhydramine Hives    Other reaction(s): hives Other reaction(s): hives   Metronidazole Nausea And Vomiting    Other reaction(s): nausea Other reaction(s): nausea   Oxycodone     Pt has tolerated hydromorphone in the past and takes Norco at home.   Adhesive [Tape] Rash   Codeine Nausea And Vomiting and Rash    Other reaction(s): hives   Loratadine Rash   Nystatin-Triamcinolone Rash   Penicillins Rash    Has patient had a PCN reaction causing immediate rash, facial/tongue/throat swelling, SOB or lightheadedness with hypotension: No Has patient had a PCN reaction causing severe rash involving mucus membranes or skin necrosis: Yes Has patient had a PCN reaction that required hospitalization: No Has patient had a PCN reaction occurring  within the last 10 years: no  If all of the above answers are "NO", then may proceed with Cephalosporin use. Other reaction(s): nausea Other reaction(s): nausea    VACCINATION STATUS: Immunization History  Administered  Date(s) Administered   Influenza,inj,Quad PF,6+ Mos 11/01/2015    Diabetes She presents for her follow-up diabetic visit. She has type 2 diabetes mellitus. Onset time: She was diagnosed at approximate age of 29 years with A1c of greater than 15% Her disease course has been improving. There are no hypoglycemic associated symptoms. Pertinent negatives for hypoglycemia include no confusion, headaches, pallor or seizures. Associated symptoms include foot paresthesias. Pertinent negatives for diabetes include no blurred vision, no chest pain, no fatigue, no polydipsia, no polyphagia and no polyuria. There are no hypoglycemic complications. Symptoms are stable. Diabetic complications include peripheral neuropathy. Risk factors for coronary artery disease include dyslipidemia, diabetes mellitus, hypertension, sedentary lifestyle and tobacco exposure. Current diabetic treatment includes intensive insulin program and oral agent (dual therapy). She is compliant with treatment most of the time. Her weight is stable. She is following a generally unhealthy diet. When asked about meal planning, she reported none. She has not had a previous visit with a dietitian. She rarely participates in exercise. Her home blood glucose trend is decreasing steadily. Her breakfast blood glucose range is generally 140-180 mg/dl. Her lunch blood glucose range is generally 140-180 mg/dl. Her dinner blood glucose range is generally 130-140 mg/dl. Her bedtime blood glucose range is generally 130-140 mg/dl. Her overall blood glucose range is 140-180 mg/dl. (She presents today with her CGM, no logs, showing improving glycemic profile overall.  Her POCT A1c today is 7.2%, improving from last visit of 8.6%.  Analysis of her CGM shows TIR 68%, TAR 31%, TBR < 1% with GMI of 7.2%.  She reports she gave herself her short-acting insulin on the way here today and the needle broke off in her stomach.  She is also asking about insulin pump options. ) An  ACE inhibitor/angiotensin II receptor blocker is not being taken. She does not see a podiatrist.Eye exam is current.  Hyperlipidemia This is a chronic problem. The current episode started more than 1 year ago. The problem is uncontrolled. Recent lipid tests were reviewed and are high. Exacerbating diseases include diabetes. Factors aggravating her hyperlipidemia include beta blockers and fatty foods. Pertinent negatives include no chest pain, myalgias or shortness of breath. Current antihyperlipidemic treatment includes fibric acid derivatives and statins. The current treatment provides mild improvement of lipids. Compliance problems include adherence to diet and adherence to exercise.  Risk factors for coronary artery disease include diabetes mellitus, dyslipidemia, a sedentary lifestyle, family history and hypertension.  Hypertension This is a chronic problem. The current episode started more than 1 year ago. The problem is uncontrolled. Pertinent negatives include no blurred vision, chest pain, headaches, palpitations or shortness of breath. There are no associated agents to hypertension. Risk factors for coronary artery disease include dyslipidemia, diabetes mellitus and sedentary lifestyle. Past treatments include calcium channel blockers and beta blockers. The current treatment provides mild improvement. Compliance problems include diet and exercise.  Hypertensive end-organ damage includes CAD/MI.   Review of systems  Constitutional: + Minimally fluctuating body weight,  current Body mass index is 33.59 kg/m. , no fatigue, no subjective hyperthermia, no subjective hypothermia Eyes: no blurry vision, no xerophthalmia ENT: no sore throat, no nodules palpated in throat, no dysphagia/odynophagia, no hoarseness Cardiovascular: no chest pain, no shortness of breath, no palpitations, no leg swelling Respiratory: no  cough, no shortness of breath Gastrointestinal: no  nausea/vomiting/diarrhea Musculoskeletal: no muscle/joint aches Skin: no rashes, no hyperemia, left lower abdomen has insulin needle stuck under skin- circled area Neurological: no tremors, + numbness/tingling to BLE, no dizziness Psychiatric: no depression, no anxiety   Objective:    BP (!) 144/92    Pulse 90    Ht 5' (1.524 m)    Wt 172 lb (78 kg)    SpO2 99%    BMI 33.59 kg/m   Wt Readings from Last 3 Encounters:  02/24/21 172 lb (78 kg)  02/20/21 172 lb 12.8 oz (78.4 kg)  02/14/21 173 lb 6.4 oz (78.7 kg)    BP Readings from Last 3 Encounters:  02/24/21 (!) 144/92  02/20/21 (!) 144/92  02/17/21 120/73     Physical Exam- Limited  Constitutional:  Body mass index is 33.59 kg/m. , not in acute distress, normal state of mind Eyes:  EOMI, no exophthalmos Neck: Supple Cardiovascular: RRR, no murmurs, rubs, or gallops, no edema Respiratory: Adequate breathing efforts, no crackles, rales, rhonchi, or wheezing Musculoskeletal: no gross deformities, strength intact in all four extremities, no gross restriction of joint movements Skin:  no rashes, no hyperemia, erythematous spot to left lower abdomen where insulin needle is stuck Neurological: no tremor with outstretched hands   Diabetic Foot Exam - Simple   No data filed    CMP ( most recent) CMP     Component Value Date/Time   NA 134 (L) 01/23/2021 0026   NA 136 12/20/2020 1208   K 3.9 01/23/2021 0026   CL 101 01/23/2021 0026   CO2 25 01/23/2021 0026   GLUCOSE 91 01/23/2021 0026   BUN 16 01/23/2021 0026   BUN 10 12/20/2020 1208   CREATININE 0.84 01/23/2021 0026   CALCIUM 8.8 (L) 01/23/2021 0026   PROT 7.0 01/23/2021 0026   PROT 6.6 12/18/2020 0834   ALBUMIN 3.4 (L) 01/23/2021 0026   ALBUMIN 4.1 12/18/2020 0834   AST 18 01/23/2021 0026   ALT 19 01/23/2021 0026   ALKPHOS 97 01/23/2021 0026   BILITOT 0.1 (L) 01/23/2021 0026   BILITOT 0.2 12/18/2020 0834   GFRNONAA >60 01/23/2021 0026   GFRAA >60 04/17/2019 1427      Diabetic Labs (most recent): Lab Results  Component Value Date   HGBA1C 7.2 (A) 02/24/2021   HGBA1C 8.6 (H) 09/18/2020   HGBA1C 11.2 (A) 07/10/2020      Assessment & Plan:   1) Uncontrolled type 2 diabetes mellitus with hyperglycemia ([place])  - Swati L Cush has currently uncontrolled symptomatic type 2 DM since  46 years of age.  She presents today with her CGM, no logs, showing improving glycemic profile overall.  Her POCT A1c today is 7.2%, improving from last visit of 8.6%.  Analysis of her CGM shows TIR 68%, TAR 31%, TBR < 1% with GMI of 7.2%.  She reports she gave herself her short-acting insulin on the way here today and the needle broke off in her stomach.  She is also asking about insulin pump options.  - I had a long discussion with her about the progressive nature of diabetes and the pathology behind its complications. -her diabetes is complicated by chronic smoking and she remains at a high risk for more acute and chronic complications which include CAD, CVA, CKD, retinopathy, and neuropathy. These are all discussed in detail with her.  - Nutritional counseling repeated at each appointment due to patients tendency to fall back in to old  habits.  - The patient admits there is a room for improvement in their diet and drink choices. -  Suggestion is made for the patient to avoid simple carbohydrates from their diet including Cakes, Sweet Desserts / Pastries, Ice Cream, Soda (diet and regular), Sweet Tea, Candies, Chips, Cookies, Sweet Pastries, Store Bought Juices, Alcohol in Excess of 1-2 drinks a day, Artificial Sweeteners, Coffee Creamer, and "Sugar-free" Products. This will help patient to have stable blood glucose profile and potentially avoid unintended weight gain.   - I encouraged the patient to switch to unprocessed or minimally processed complex starch and increased protein intake (animal or plant source), fruits, and vegetables.   - Patient is advised to stick  to a routine mealtimes to eat 3 meals a day and avoid unnecessary snacks (to snack only to correct hypoglycemia).  - she will be scheduled with Jearld Fenton, RDN, CDE for diabetes education.  - I have approached her with the following individualized plan to manage  her diabetes and patient agrees:   -Based on her improved glycemic profile overall, no changes will be made to her medications today.  She is advised to continue Lantus 30 units SQ nightly, Novolog 10-16 units TID with meals if glucose is above 90 and she is eating (Specific instructions on how to titrate insulin dosage based on glucose readings given to patient in writing), continue Metformin 1000 mg po twice daily with meals and Glipizide 5 mg po daily with breakfast.   -I encouraged the patient to reach out to her insurance company and find out which insulin pumps are covered and what her out of pocket costs will be before we pursue.  -She is encouraged to continue monitoring blood glucose 4 times daily (using her CGM), before meals and at bedtime, and to call the clinic if she has readings less than 70 or greater than 300 for 3 readings in a row.   - she is not a suitable candidate for incretin therapy, she will need fasting lipid profile on subsequent visits.  - Specific targets for  A1c;  LDL, HDL, Triglycerides, were discussed with the patient.  2) Blood Pressure /Hypertension: Her blood pressure is not controlled to target. She is allergic to ACE inhibitors. She has a history of being a chronic heavy smoker, quit in 2021.  She is advised to continue her current medications including Metoprolol 50 mg po daily and Norvasc 5 mg po daily.   3) Lipids/Hyperlipidemia:  Her most recent lipid panel from 12/18/20 shows uncontrolled LDL of 163 and elevated triglycerides of 165.  She is advised to continue her Crestor 40 mg po daily at bedtime, Repatha 140 mg po every 14 days, and Tricor 145 mg po daily.  Side effects and precautions  discussed with her.    4)  Weight/Diet:  Her Body mass index is 33.59 kg/m.-clearly complicating her diabetes care.  She is a candidate for modest weight loss.  I discussed with her the fact that loss of 5 - 10% of her  current body weight will have the most impact on her diabetes management.  Exercise, and detailed carbohydrates information provided  -  detailed on discharge instructions.  5) Chronic Care/Health Maintenance: -she is not on ACEI/ARB and is on Statin medications and is encouraged to initiate and continue to follow up with Ophthalmology, Dentist,  Podiatrist at least yearly or according to recommendations, and advised to stay away from smoking. I have recommended yearly flu vaccine and pneumonia vaccine at least  every 5 years; moderate intensity exercise for up to 150 minutes weekly; and  sleep for at least 7 hours a day.  - she is advised to maintain close follow up with Coolidge Breeze, FNP for primary care needs, as well as her other providers for optimal and coordinated care.      I spent 40 minutes in the care of the patient today including review of labs from [facility], Lipids, Thyroid Function, Hematology (current and previous including abstractions from other facilities); face-to-face time discussing  her blood glucose readings/logs, discussing hypoglycemia and hyperglycemia episodes and symptoms, medications doses, her options of short and long term treatment based on the latest standards of care / guidelines;  discussion about incorporating lifestyle medicine;  and documenting the encounter.    Please refer to Patient Instructions for Blood Glucose Monitoring and Insulin/Medications Dosing Guide"  in media tab for additional information. Please  also refer to " Patient Self Inventory" in the Media  tab for reviewed elements of pertinent patient history.  Sandra [name] participated in the discussions, expressed understanding, and voiced agreement with the above plans.  All  questions were answered to her satisfaction. she is encouraged to contact clinic should she have any questions or concerns prior to her return visit.   Follow up plan: - Return in about 4 months (around 06/24/2021) for Diabetes F/U- A1c and UM in office, No previsit labs, Bring meter and logs.   Rayetta Pigg, [facility] [address] [place], [place] [address] Phone: [phone] Fax: [phone]  02/24/2021, 3:06 PM

## 2021-02-26 ENCOUNTER — Other Ambulatory Visit: Payer: Self-pay | Admitting: Nurse Practitioner

## 2021-03-20 ENCOUNTER — Ambulatory Visit: Payer: Medicaid Other | Admitting: Cardiovascular Disease

## 2021-03-27 ENCOUNTER — Other Ambulatory Visit: Payer: Self-pay

## 2021-03-27 ENCOUNTER — Encounter: Payer: Self-pay | Admitting: Urology

## 2021-03-27 ENCOUNTER — Ambulatory Visit (INDEPENDENT_AMBULATORY_CARE_PROVIDER_SITE_OTHER): Payer: Medicaid Other | Admitting: Urology

## 2021-03-27 VITALS — BP 147/83 | HR 108

## 2021-03-27 DIAGNOSIS — N3941 Urge incontinence: Secondary | ICD-10-CM | POA: Diagnosis not present

## 2021-03-27 DIAGNOSIS — N3281 Overactive bladder: Secondary | ICD-10-CM | POA: Diagnosis not present

## 2021-03-27 DIAGNOSIS — Z8551 Personal history of malignant neoplasm of bladder: Secondary | ICD-10-CM | POA: Diagnosis not present

## 2021-03-27 DIAGNOSIS — R3915 Urgency of urination: Secondary | ICD-10-CM | POA: Diagnosis not present

## 2021-03-27 LAB — URINALYSIS, ROUTINE W REFLEX MICROSCOPIC
Bilirubin, UA: NEGATIVE
Nitrite, UA: NEGATIVE
Protein,UA: NEGATIVE
RBC, UA: NEGATIVE
Specific Gravity, UA: >1.03 — ABNORMAL HIGH (ref 1.005–1.030)
Urobilinogen, Ur: 1 mg/dL (ref 0.2–1.0)
pH, UA: 5.5 (ref 5.0–7.5)

## 2021-03-27 LAB — MICROSCOPIC EXAMINATION
RBC, Urine: NONE SEEN /hpf (ref 0–2)
Renal Epithel, UA: NONE SEEN /hpf

## 2021-03-27 NOTE — Progress Notes (Signed)
?Subjective: ? ?1. History of bladder cancer   ?2. Urinary urgency   ?3. OAB (overactive bladder)   ?4. Urge incontinence   ?5. Gross hematuria   ?6. Bacteriuria   ?  ?Sandra [name] returns today in f/u. She has a history of OAB and had an Interstim placed in 3/20 by Dr. Alyson Ingles.  She is overdue and last saw Dr. Alyson Ingles in 3/22 for her OAB symptoms and was to return for Interstim adjustment but didn't.  She has some frequency and intermittency.  She has been having intermittent hematuria with the last episode last week.  She has had no dysuria, flank pain or fever.   ? 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  She had induction BCG with some maintenance with the last in 1/20.  She had cystoscopy last in 1/22 that was negative.    She had upper tract imaging with a CT in 1/23 and the kidneys and bladder were unremarkable per report from [place].   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  ? ? ?She in 2022 she lost her Husband and lost a daughter in childbirth.   Her grandson survived and she is raising him.  ? ?  ? ?ROS: ? ?ROS:  ?A complete review of systems was performed.  All systems are negative except for pertinent findings as noted.  ? ?Review of Systems  ?Constitutional:  Positive for malaise/fatigue.  ?HENT:  Positive for congestion.   ?Respiratory:  Positive for cough and shortness of breath.   ?Cardiovascular:  Positive for chest pain.  ?Gastrointestinal:  Positive for heartburn.  ?Musculoskeletal:  Positive for back pain and joint pain.  ?Neurological:  Positive for dizziness.  ?Psychiatric/Behavioral:  Positive for depression. The patient is nervous/anxious.   ? ?Allergies  ?Allergen Reactions  ? Ibuprofen Hives  ?  itching ?itching ?itching  ? Benazepril Hives  ? Diphenhydramine Hives  ?  Other reaction(s): hives ?Other reaction(s): hives  ? Metronidazole Nausea And Vomiting  ?  Other reaction(s): nausea ?Other  reaction(s): nausea  ? Oxycodone   ?  Pt has tolerated hydromorphone in the past and takes Norco at home.  ? Adhesive [Tape] Rash  ? Codeine Nausea And Vomiting and Rash  ?  Other reaction(s): hives  ? Loratadine Rash  ? Nystatin-Triamcinolone Rash  ? Penicillins Rash  ?  Has patient had a PCN reaction causing immediate rash, facial/tongue/throat swelling, SOB or lightheadedness with hypotension: No ?Has patient had a PCN reaction causing severe rash involving mucus membranes or skin necrosis: Yes ?Has patient had a PCN reaction that required hospitalization: No ?Has patient had a PCN reaction occurring within the last 10 years: no ? ?If all of the above answers are "NO", then may proceed with Cephalosporin use. ?Other reaction(s): nausea ?Other reaction(s): nausea  ? ? ?Outpatient Encounter Medications as of 03/27/2021  ?Medication Sig  ? ACCU-CHEK GUIDE test strip USE AS INSTRUCTED TO MONITOR GLUCOSE 4 TIMES DAILY BEFORE MEALS AND BEFORE BED.  ? Accu-Chek Softclix Lancets lancets Use as instructed to monitor glucose 4 times daily  ? aspirin EC 81 MG tablet Take 1 tablet (81 mg total) by mouth daily. Swallow whole.  ? Blood Glucose Monitoring Suppl (ACCU-CHEK GUIDE) w/Device KIT 1 Piece by Does not apply route as directed.  ? Blood Pressure Monitoring (BLOOD PRESSURE CUFF) MISC See admin instructions.  ? buPROPion (WELLBUTRIN XL) 300 MG 24  hr tablet Take 300 mg by mouth every morning.  ? cloNIDine HCl (KAPVAY) 0.1 MG TB12 ER tablet 1 tablet at bedtime.  ? Continuous Blood Gluc Sensor (DEXCOM G6 SENSOR) MISC Apply new sensor every 10 days as directed  ? Continuous Blood Gluc Transmit (DEXCOM G6 TRANSMITTER) MISC 1 Device by Does not apply route every 3 (three) months.  ? Dexlansoprazole 30 MG capsule DR Take 30 mg by mouth daily.  ? diclofenac (VOLTAREN) 75 MG EC tablet Take 75 mg by mouth 2 (two) times daily.  ? Evolocumab (REPATHA SURECLICK) 765 MG/ML SOAJ Inject 1 Dose into the skin every 14 (fourteen) days.  ?  fenofibrate (TRICOR) 145 MG tablet Take 145 mg by mouth daily.  ? FLUoxetine (PROZAC) 20 MG capsule Take 20 mg by mouth daily. Takes  at bedtime  ? FLUoxetine (PROZAC) 40 MG capsule Take 40 mg by mouth daily.  ? gabapentin (NEURONTIN) 300 MG capsule TAKE 1 CAPSULE(300 MG) BY MOUTH THREE TIMES DAILY  ? glipiZIDE (GLUCOTROL XL) 5 MG 24 hr tablet Take 1 tablet (5 mg total) by mouth daily with breakfast.  ? GLOBAL EASE INJECT PEN NEEDLES 31G X 5 MM MISC Inject into the skin.  ? hydrocortisone (ANUSOL-HC) 2.5 % rectal cream Place 1 application rectally 2 (two) times daily.  ? insulin aspart (NOVOLOG) 100 UNIT/ML FlexPen Inject 10-16 Units into the skin 3 (three) times daily with meals.  ? insulin glargine (LANTUS) 100 UNIT/ML Solostar Pen Inject 30 Units into the skin at bedtime.  ? Insulin Pen Needle (PEN NEEDLES) 31G X 6 MM MISC Use to inject insulin 4 times daily  ? metFORMIN (GLUCOPHAGE) 1000 MG tablet 2 tablet with meals  ? metoprolol succinate (TOPROL-XL) 100 MG 24 hr tablet Take 1 tablet (100 mg total) by mouth daily. Take with or immediately following a meal.  ? montelukast (SINGULAIR) 10 MG tablet Take 10 mg by mouth at bedtime.  ? pantoprazole (PROTONIX) 40 MG tablet Take 1 tablet (40 mg total) by mouth 2 (two) times daily before a meal.  ? rosuvastatin (CRESTOR) 40 MG tablet Take 40 mg by mouth daily.  ? VYVANSE 40 MG capsule Take 40 mg by mouth every morning.  ? amLODipine (NORVASC) 5 MG tablet Take 1 tablet (5 mg total) by mouth daily.  ? isosorbide mononitrate (IMDUR) 60 MG 24 hr tablet Take 1 tablet (60 mg total) by mouth daily.  ? nitroGLYCERIN (NITROSTAT) 0.4 MG SL tablet Place 1 tablet (0.4 mg total) under the tongue every 5 (five) minutes as needed for chest pain.  ? ?Facility-Administered Encounter Medications as of 03/27/2021  ?Medication  ? betamethasone acetate-betamethasone sodium phosphate (CELESTONE) injection 3 mg  ? ? ?Past Medical History:  ?Diagnosis Date  ? ADHD (attention deficit  hyperactivity disorder)   ? Allergic rhinitis   ? Anemia   ? hx of  ? Bipolar 1 disorder ([place])   ? Bladder tumor   ? CAD (coronary artery disease)   ? Cancer [facility])   ? bladder  ? Dilated cardiomyopathy ([place]) 12/20/2020  ? Echo 6/22 ([facility]): EF 43, mild MR, mild TR // Echo 3/22: EF 50-55  ? Dizziness   ? Full dentures   ? GERD (gastroesophageal reflux disease)   ? Gross hematuria   ? Hyperlipidemia   ? Hypertension   ? Stable angina ([facility])   ? Type 2 diabetes mellitus ([place])   ? Urgency of urination   ? dysuria, sui  ? Wears glasses   ? ? ?  Past Surgical History:  ?Procedure Laterality Date  ? BALLOON DILATION N/A 02/17/2021  ? Procedure: BALLOON DILATION;  Surgeon: Eloise Harman, DO;  [facility];  Service: Endoscopy;  Laterality: N/A;  ? BIOPSY  02/17/2021  ? Procedure: BIOPSY;  Surgeon: Eloise Harman, DO;  [facility];  Service: Endoscopy;;  ? COLONOSCOPY WITH PROPOFOL N/A 02/17/2021  ? Procedure: COLONOSCOPY WITH PROPOFOL;  Surgeon: Eloise Harman, DO;  [facility];  Service: Endoscopy;  Laterality: N/A;  8:30am  ? CYSTOSCOPY N/A 10/03/2015  ? Procedure: CYSTOSCOPY;  Surgeon: Irine Seal, MD;  Location[facility];  Service: Urology;  Laterality: N/A;  ? CYSTOSCOPY WITH STENT PLACEMENT Right 10/31/2015  ? Procedure: CYSTOSCOPY WITH ATTEMPTED RIGHT URETERAL OPENING;  Surgeon: Irine Seal, MD;  [facility];  Service: Urology;  Laterality: Right;  ? ESOPHAGOGASTRODUODENOSCOPY (EGD) WITH PROPOFOL N/A 02/17/2021  ? Procedure: ESOPHAGOGASTRODUODENOSCOPY (EGD) WITH PROPOFOL;  Surgeon: Eloise Harman, DO;  [facility];  Service: Endoscopy;  Laterality: N/A;  ? FOOT SURGERY Left   ? September 2022, broke 7 bones in left foot  ? INTERSTIM IMPLANT PLACEMENT N/A 03/14/2018  ? Procedure: INTERSTIM IMPLANT FIRST STAGE;  Surgeon: Cleon Gustin, MD;  [facility]: [facility];  Service: Urology;  Laterality: N/A;  ? INTERSTIM IMPLANT PLACEMENT  N/A 03/30/2018  ? Procedure: INTERSTIM IMPLANT SECOND STAGE;  Surgeon: Cleon Gustin, MD;  [facility];  Service: Urology;  Laterality: N/A;  ? LEFT HEART CATH AND CORONARY ANGIOGRAPHY N/A 05/31/2020  ? Procedure

## 2021-03-28 ENCOUNTER — Other Ambulatory Visit: Payer: Self-pay | Admitting: Gastroenterology

## 2021-03-28 DIAGNOSIS — K219 Gastro-esophageal reflux disease without esophagitis: Secondary | ICD-10-CM

## 2021-03-28 LAB — CYTOLOGY, URINE

## 2021-03-28 NOTE — Telephone Encounter (Signed)
Noted  

## 2021-03-29 LAB — URINE CULTURE

## 2021-04-04 ENCOUNTER — Telehealth: Payer: Self-pay

## 2021-04-04 NOTE — Telephone Encounter (Signed)
Patient called and notified. Appt made for 04/13 ?

## 2021-04-04 NOTE — Telephone Encounter (Signed)
-----   Message from Irine Seal, MD sent at 03/31/2021 12:07 PM EDT ----- ?Culture is negative.  She needs the cystoscopy and if we can do it sooner than 4/27, that would be great.  ?----- Message ----- ?From: Dorisann Frames, RN ?Sent: 03/31/2021   9:04 AM EDT ?To: Irine Seal, MD ? ?Please review ? ?

## 2021-04-24 ENCOUNTER — Ambulatory Visit (INDEPENDENT_AMBULATORY_CARE_PROVIDER_SITE_OTHER): Payer: Medicaid Other | Admitting: Urology

## 2021-04-24 ENCOUNTER — Telehealth: Payer: Self-pay

## 2021-04-24 VITALS — BP 145/82 | HR 86

## 2021-04-24 DIAGNOSIS — Z8551 Personal history of malignant neoplasm of bladder: Secondary | ICD-10-CM | POA: Diagnosis not present

## 2021-04-24 DIAGNOSIS — R310 Gross hematuria: Secondary | ICD-10-CM

## 2021-04-24 LAB — URINALYSIS, ROUTINE W REFLEX MICROSCOPIC
Bilirubin, UA: NEGATIVE
Glucose, UA: NEGATIVE
Ketones, UA: NEGATIVE
Leukocytes,UA: NEGATIVE
Nitrite, UA: NEGATIVE
Protein,UA: NEGATIVE
RBC, UA: NEGATIVE
Specific Gravity, UA: 1.025 (ref 1.005–1.030)
Urobilinogen, Ur: 0.2 mg/dL (ref 0.2–1.0)
pH, UA: 5.5 (ref 5.0–7.5)

## 2021-04-24 MED ORDER — CIPROFLOXACIN HCL 500 MG PO TABS
500.0000 mg | ORAL_TABLET | Freq: Once | ORAL | Status: AC
  Administered 2021-04-24: 500 mg via ORAL

## 2021-04-24 NOTE — Progress Notes (Signed)
?Subjective: ? ?1. History of bladder cancer   ?2. Gross hematuria   ?  ?04/24/21: Sandra [name] returns today for cystoscopy.  The culture at her last visit was negative.   UA is clear today.  She says she still has occasional blood in the urine.  ? ?03/27/21: Sandra [name] returns today in f/u. She has a history of OAB and had an Interstim placed in 3/20 by Dr. Alyson Ingles.  She is overdue and last saw Dr. Alyson Ingles in 3/22 for her OAB symptoms and was to return for Interstim adjustment but didn't.  She has some frequency and intermittency.  She has been having intermittent hematuria with the last episode last week.  She has had no dysuria, flank pain or fever.   ? 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  She had induction BCG with some maintenance with the last in 1/20.  She had cystoscopy last in 1/22 that was negative.    She had upper tract imaging with a CT in 1/23 and the kidneys and bladder were unremarkable per report from [place].   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  ? ? ?She in 2022 she lost her Husband and lost a daughter in childbirth.   Her grandson survived and she is raising him.  ? ?  ? ?ROS: ? ?ROS:  ?A complete review of systems was performed.  All systems are negative except for pertinent findings as noted.  ? ?ROS ? ?Allergies  ?Allergen Reactions  ? Ibuprofen Hives  ?  itching ?itching ?itching  ? Benazepril Hives  ? Diphenhydramine Hives  ?  Other reaction(s): hives ?Other reaction(s): hives  ? Metronidazole Nausea And Vomiting  ?  Other reaction(s): nausea ?Other reaction(s): nausea  ? Oxycodone   ?  Pt has tolerated hydromorphone in the past and takes Norco at home.  ? Adhesive [Tape] Rash  ? Codeine Nausea And Vomiting and Rash  ?  Other reaction(s): hives  ? Loratadine Rash  ? Nystatin-Triamcinolone Rash  ? Penicillins Rash  ?  Has patient had a PCN reaction causing immediate rash, facial/tongue/throat swelling,  SOB or lightheadedness with hypotension: No ?Has patient had a PCN reaction causing severe rash involving mucus membranes or skin necrosis: Yes ?Has patient had a PCN reaction that required hospitalization: No ?Has patient had a PCN reaction occurring within the last 10 years: no ? ?If all of the above answers are "NO", then may proceed with Cephalosporin use. ?Other reaction(s): nausea ?Other reaction(s): nausea  ? ? ?Outpatient Encounter Medications as of 04/24/2021  ?Medication Sig  ? ACCU-CHEK GUIDE test strip USE AS INSTRUCTED TO MONITOR GLUCOSE 4 TIMES DAILY BEFORE MEALS AND BEFORE BED.  ? Accu-Chek Softclix Lancets lancets Use as instructed to monitor glucose 4 times daily  ? aspirin EC 81 MG tablet Take 1 tablet (81 mg total) by mouth daily. Swallow whole.  ? Blood Glucose Monitoring Suppl (ACCU-CHEK GUIDE) w/Device KIT 1 Piece by Does not apply route as directed.  ? Blood Pressure Monitoring (BLOOD PRESSURE CUFF) MISC See admin instructions.  ? buPROPion (WELLBUTRIN XL) 300 MG 24 hr tablet Take 300 mg by mouth every morning.  ? cloNIDine HCl (KAPVAY) 0.1 MG TB12 ER tablet 1 tablet at bedtime.  ? Continuous Blood Gluc Sensor (DEXCOM G6 SENSOR) MISC Apply new sensor every 10 days as directed  ? Continuous Blood Gluc Transmit (DEXCOM G6 TRANSMITTER) MISC 1  Device by Does not apply route every 3 (three) months.  ? Dexlansoprazole 30 MG capsule DR Take 30 mg by mouth daily.  ? diclofenac (VOLTAREN) 75 MG EC tablet Take 75 mg by mouth 2 (two) times daily.  ? Evolocumab (REPATHA SURECLICK) 233 MG/ML SOAJ Inject 1 Dose into the skin every 14 (fourteen) days.  ? fenofibrate (TRICOR) 145 MG tablet Take 145 mg by mouth daily.  ? FLUoxetine (PROZAC) 20 MG capsule Take 20 mg by mouth daily. Takes  at bedtime  ? FLUoxetine (PROZAC) 40 MG capsule Take 40 mg by mouth daily.  ? gabapentin (NEURONTIN) 300 MG capsule TAKE 1 CAPSULE(300 MG) BY MOUTH THREE TIMES DAILY  ? glipiZIDE (GLUCOTROL XL) 5 MG 24 hr tablet Take 1 tablet  (5 mg total) by mouth daily with breakfast.  ? GLOBAL EASE INJECT PEN NEEDLES 31G X 5 MM MISC Inject into the skin.  ? hydrocortisone (ANUSOL-HC) 2.5 % rectal cream Place 1 application rectally 2 (two) times daily.  ? insulin aspart (NOVOLOG) 100 UNIT/ML FlexPen Inject 10-16 Units into the skin 3 (three) times daily with meals.  ? insulin glargine (LANTUS) 100 UNIT/ML Solostar Pen Inject 30 Units into the skin at bedtime.  ? Insulin Pen Needle (PEN NEEDLES) 31G X 6 MM MISC Use to inject insulin 4 times daily  ? metFORMIN (GLUCOPHAGE) 1000 MG tablet 2 tablet with meals  ? metoprolol succinate (TOPROL-XL) 100 MG 24 hr tablet Take 1 tablet (100 mg total) by mouth daily. Take with or immediately following a meal.  ? montelukast (SINGULAIR) 10 MG tablet Take 10 mg by mouth at bedtime.  ? pantoprazole (PROTONIX) 40 MG tablet Take 1 tablet (40 mg total) by mouth 2 (two) times daily before a meal.  ? rosuvastatin (CRESTOR) 40 MG tablet Take 40 mg by mouth daily.  ? VYVANSE 40 MG capsule Take 40 mg by mouth every morning.  ? amLODipine (NORVASC) 5 MG tablet Take 1 tablet (5 mg total) by mouth daily.  ? isosorbide mononitrate (IMDUR) 60 MG 24 hr tablet Take 1 tablet (60 mg total) by mouth daily.  ? nitroGLYCERIN (NITROSTAT) 0.4 MG SL tablet Place 1 tablet (0.4 mg total) under the tongue every 5 (five) minutes as needed for chest pain.  ? ?Facility-Administered Encounter Medications as of 04/24/2021  ?Medication  ? betamethasone acetate-betamethasone sodium phosphate (CELESTONE) injection 3 mg  ? [COMPLETED] ciprofloxacin (CIPRO) tablet 500 mg  ? ? ?Past Medical History:  ?Diagnosis Date  ? ADHD (attention deficit hyperactivity disorder)   ? Allergic rhinitis   ? Anemia   ? hx of  ? Bipolar 1 disorder ([place])   ? Bladder tumor   ? CAD (coronary artery disease)   ? Cancer [facility])   ? bladder  ? Dilated cardiomyopathy ([place]) 12/20/2020  ? Echo 6/22 ([facility]): EF 43, mild MR, mild TR // Echo 3/22: EF 50-55  ? Dizziness   ? Full dentures    ? GERD (gastroesophageal reflux disease)   ? Gross hematuria   ? Hyperlipidemia   ? Hypertension   ? Stable angina (HCC)   ? Type 2 diabetes mellitus ([place])   ? Urgency of urination   ? dysuria, sui  ? Wears glasses   ? ? ?Past Surgical History:  ?Procedure Laterality Date  ? BALLOON DILATION N/A 02/17/2021  ? Procedure: BALLOON DILATION;  Surgeon: Eloise Harman, DO;  [facility];  Service: Endoscopy;  Laterality: N/A;  ? BIOPSY  02/17/2021  ? Procedure: BIOPSY;  Surgeon: Eloise Harman, DO;  [facility];  Service: Endoscopy;;  ? COLONOSCOPY WITH PROPOFOL N/A 02/17/2021  ? Procedure: COLONOSCOPY WITH PROPOFOL;  Surgeon: Eloise Harman, DO;  [facility];  Service: Endoscopy;  Laterality: N/A;  8:30am  ? CYSTOSCOPY N/A 10/03/2015  ? Procedure: CYSTOSCOPY;  Surgeon: Irine Seal, MD;  Location: [facility];  Service: Urology;  Laterality: N/A;  ? CYSTOSCOPY WITH STENT PLACEMENT Right 10/31/2015  ? Procedure: CYSTOSCOPY WITH ATTEMPTED RIGHT URETERAL OPENING;  Surgeon: Irine Seal, MD;  Location: [facility];  Service: Urology;  Laterality: Right;  ? ESOPHAGOGASTRODUODENOSCOPY (EGD) WITH PROPOFOL N/A 02/17/2021  ? Procedure: ESOPHAGOGASTRODUODENOSCOPY (EGD) WITH PROPOFOL;  Surgeon: Eloise Harman, DO;  [facility];  Service: Endoscopy;  Laterality: N/A;  ? FOOT SURGERY Left   ? September 2022, broke 7 bones in left foot  ? INTERSTIM IMPLANT PLACEMENT N/A 03/14/2018  ? Procedure: INTERSTIM IMPLANT FIRST STAGE;  Surgeon: Cleon Gustin, MD;  [facility];  Service: Urology;  Laterality: N/A;  ? INTERSTIM IMPLANT PLACEMENT N/A 03/30/2018  ? Procedure: INTERSTIM IMPLANT SECOND STAGE;  Surgeon: Cleon Gustin, MD;  [facility];  Service: Urology;  Laterality: N/A;  ? LEFT HEART CATH AND CORONARY ANGIOGRAPHY N/A 05/31/2020  ? Procedure: LEFT HEART CATH AND CORONARY ANGIOGRAPHY;  Surgeon: Troy Sine, MD;  Location: [facility];  Service: Cardiovascular;  Laterality: N/A;  ? MULTIPLE TOOTH EXTRACTIONS  2015  ? OVARIAN CYST REMOVAL Right 2004 approx  ? SHOULDER OPEN ROTATOR CUFF REPAIR Left 04/20/2019  ? Procedure: ROTA

## 2021-04-24 NOTE — Telephone Encounter (Signed)
Patient came in for appt with Dr. Jeffie Pollock 04/24/21 she advised she had some questions regarding her Interstim device she wanted to address with you. Dr. Jeffie Pollock advised patient you were more knowledgeable about devise since you placed it. Patient advised someone would be in contact with her once you were back in office. Patient voiced understanding.  ?

## 2021-04-25 LAB — CYTOLOGY, URINE

## 2021-04-28 ENCOUNTER — Other Ambulatory Visit: Payer: Self-pay | Admitting: Nurse Practitioner

## 2021-04-30 ENCOUNTER — Ambulatory Visit: Payer: Medicaid Other | Admitting: Urology

## 2021-05-01 ENCOUNTER — Other Ambulatory Visit: Payer: Self-pay | Admitting: Urology

## 2021-05-01 DIAGNOSIS — Z8551 Personal history of malignant neoplasm of bladder: Secondary | ICD-10-CM

## 2021-05-01 NOTE — Progress Notes (Unsigned)
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  I will get a FISH test for further clarification.  ?

## 2021-05-06 ENCOUNTER — Ambulatory Visit (INDEPENDENT_AMBULATORY_CARE_PROVIDER_SITE_OTHER): Payer: Medicaid Other | Admitting: Urology

## 2021-05-06 ENCOUNTER — Encounter: Payer: Self-pay | Admitting: Urology

## 2021-05-06 VITALS — BP 135/78 | HR 89

## 2021-05-06 DIAGNOSIS — N3281 Overactive bladder: Secondary | ICD-10-CM

## 2021-05-06 DIAGNOSIS — R3912 Poor urinary stream: Secondary | ICD-10-CM | POA: Diagnosis not present

## 2021-05-06 DIAGNOSIS — Z8551 Personal history of malignant neoplasm of bladder: Secondary | ICD-10-CM

## 2021-05-06 DIAGNOSIS — N3941 Urge incontinence: Secondary | ICD-10-CM

## 2021-05-06 MED ORDER — TAMSULOSIN HCL 0.4 MG PO CAPS: 0.4000 mg | ORAL_CAPSULE | Freq: Every day | ORAL | 1 refills | Status: DC

## 2021-05-06 NOTE — Progress Notes (Signed)
Urine sent for Dian[place] testing ?#[id number] ?

## 2021-05-06 NOTE — Progress Notes (Signed)
? ?05/06/2021 ?11:37 AM  ? ?Kiely [name] ?10/05/1975 ?[id number] ? ?Referring provider: Coolidge Breeze, FNP ?[address] ?[place],  [place] [address] ? ?Followup OAb and urge incontinence ? ? ?HPI: ?[name] is a 46yo here for followup for OAB and urge incontinence. She underwent interstim in 03/2018. She was doing well until 3 months ago when she developed urinary urgency with urge incontinence. She can feel the device is working. She increased the voltage which failed to improve her OAB symptoms. She drinks 2 can of mountain dew daily. She drinks over 150oz of water daily. H1c is 7.4.  ? ? ? ? ? ? ? ?PMH: ?Past Medical History:  ?Diagnosis Date  ? ADHD (attention deficit hyperactivity disorder)   ? Allergic rhinitis   ? Anemia   ? hx of  ? Bipolar 1 disorder ([place])   ? Bladder tumor   ? CAD (coronary artery disease)   ? Cancer [facility])   ? bladder  ? Dilated cardiomyopathy ([place]) 12/20/2020  ? Echo 6/22 ([facility]): EF 43, mild MR, mild TR // Echo 3/22: EF 50-55  ? Dizziness   ? Full dentures   ? GERD (gastroesophageal reflux disease)   ? Gross hematuria   ? Hyperlipidemia   ? Hypertension   ? Stable angina (HCC)   ? Type 2 diabetes mellitus ([place])   ? Urgency of urination   ? dysuria, sui  ? Wears glasses   ? ? ?Surgical History: ?Past Surgical History:  ?Procedure Laterality Date  ? BALLOON DILATION N/A 02/17/2021  ? Procedure: BALLOON DILATION;  Surgeon: Eloise Harman, DO;  [facility];  Service: Endoscopy;  Laterality: N/A;  ? BIOPSY  02/17/2021  ? Procedure: BIOPSY;  Surgeon: Eloise Harman, DO;  [facility];  Service: Endoscopy;;  ? COLONOSCOPY WITH PROPOFOL N/A 02/17/2021  ? Procedure: COLONOSCOPY WITH PROPOFOL;  Surgeon: Eloise Harman, DO;  [facility];  Service: Endoscopy;  Laterality: N/A;  8:30am  ? CYSTOSCOPY N/A 10/03/2015  ? Procedure: CYSTOSCOPY;  Surgeon: Irine Seal, MD;  Location: [facility];  Service: Urology;  Laterality: N/A;  ? CYSTOSCOPY WITH STENT  PLACEMENT Right 10/31/2015  ? Procedure: CYSTOSCOPY WITH ATTEMPTED RIGHT URETERAL OPENING;  Surgeon: Irine Seal, MD;  Location: [facility];  Service: Urology;  Laterality: Right;  ? ESOPHAGOGASTRODUODENOSCOPY (EGD) WITH PROPOFOL N/A 02/17/2021  ? Procedure: ESOPHAGOGASTRODUODENOSCOPY (EGD) WITH PROPOFOL;  Surgeon: Eloise Harman, DO;  [facility];  Service: Endoscopy;  Laterality: N/A;  ? FOOT SURGERY Left   ? September 2022, broke 7 bones in left foot  ? INTERSTIM IMPLANT PLACEMENT N/A 03/14/2018  ? Procedure: INTERSTIM IMPLANT FIRST STAGE;  Surgeon: Cleon Gustin, MD;  [facility];  Service: Urology;  Laterality: N/A;  ? INTERSTIM IMPLANT PLACEMENT N/A 03/30/2018  ? Procedure: INTERSTIM IMPLANT SECOND STAGE;  Surgeon: Cleon Gustin, MD;  [facility];  Service: Urology;  Laterality: N/A;  ? LEFT HEART CATH AND CORONARY ANGIOGRAPHY N/A 05/31/2020  ? Procedure: LEFT HEART CATH AND CORONARY ANGIOGRAPHY;  Surgeon: Troy Sine, MD;  Location: [place] [facility];  Service: Cardiovascular;  Laterality: N/A;  ? MULTIPLE TOOTH EXTRACTIONS  2015  ? OVARIAN CYST REMOVAL Right 2004 approx  ? SHOULDER OPEN ROTATOR CUFF REPAIR Left 04/20/2019  ? Procedure: ROTATOR CUFF REPAIR SHOULDER OPEN;  Surgeon: Carole Civil, MD;  [facility];  Service: Orthopedics;  Laterality: Left;  ?  TONSILLECTOMY  12/30/2004  ? TOTAL ABDOMINAL HYSTERECTOMY W/ BILATERAL SALPINGOOPHORECTOMY  05/16/2004  ? TRANSURETHRAL RESECTION OF BLADDER TUMOR N/A 10/03/2015  ? Procedure: TRANSURETHRAL RESECTION OF BLADDER TUMOR (TURBT);  Surgeon: Irine Seal, MD;  Location: [facility];  Service: Urology;  Laterality: N/A;  ? TRANSURETHRAL RESECTION OF BLADDER TUMOR N/A 10/31/2015  ? Procedure: RE-STAGINGG TRANSURETHRAL RESECTION OF BLADDER TUMOR (TURBT);  Surgeon: Irine Seal, MD;  [facility];  Service: Urology;  Laterality: N/A;  ? TRANSURETHRAL RESECTION OF  BLADDER TUMOR N/A 08/17/2016  ? Procedure: TRANSURETHRAL RESECTION OF BLADDER TUMOR (TURBT);  Surgeon: Cleon Gustin, MD;  [facility];  Service: Urology;  Laterality: N/A;  ? ? ?Home Medications:  ?Allergies as of 05/06/2021   ? ?   Reactions  ? Ibuprofen Hives  ? itching ?itching ?itching  ? Benazepril Hives  ? Diphenhydramine Hives  ? Other reaction(s): hives ?Other reaction(s): hives  ? Metronidazole Nausea And Vomiting  ? Other reaction(s): nausea ?Other reaction(s): nausea  ? Oxycodone   ? Pt has tolerated hydromorphone in the past and takes Norco at home.  ? Adhesive [tape] Rash  ? Codeine Nausea And Vomiting, Rash  ? Other reaction(s): hives  ? Loratadine Rash  ? Nystatin-triamcinolone Rash  ? Penicillins Rash  ? Has patient had a PCN reaction causing immediate rash, facial/tongue/throat swelling, SOB or lightheadedness with hypotension: No ?Has patient had a PCN reaction causing severe rash involving mucus membranes or skin necrosis: Yes ?Has patient had a PCN reaction that required hospitalization: No ?Has patient had a PCN reaction occurring within the last 10 years: no ?If all of the above answers are "NO", then may proceed with Cephalosporin use. ?Other reaction(s): nausea ?Other reaction(s): nausea  ? ?  ? ?  ?Medication List  ?  ? ?  ? Accurate as of May 06, 2021 11:37 AM. If you have any questions, ask your nurse or doctor.  ?  ?  ? ?  ? ?Accu-Chek Guide test strip ?Generic drug: glucose blood ?USE AS INSTRUCTED TO MONITOR GLUCOSE 4 TIMES DAILY BEFORE MEALS AND BEFORE BED. ?  ?Accu-Chek Guide w/Device Kit ?1 Piece by Does not apply route as directed. ?  ?Accu-Chek Softclix Lancets lancets ?Use as instructed to monitor glucose 4 times daily ?  ?amLODipine 5 MG tablet ?Commonly known as: NORVASC ?Take 1 tablet (5 mg total) by mouth daily. ?  ?aspirin EC 81 MG tablet ?Take 1 tablet (81 mg total) by mouth daily. Swallow whole. ?  ?Blood Pressure Cuff Misc ?See admin instructions. ?   ?buPROPion 300 MG 24 hr tablet ?Commonly known as: WELLBUTRIN XL ?Take 300 mg by mouth every morning. ?  ?cloNIDine HCl 0.1 MG Tb12 ER tablet ?Commonly known as: KAPVAY ?1 tablet at bedtime. ?  ?Dexcom G6 Sensor Misc ?Apply new sensor every 10 days as directed ?  ?Dexcom G6 Transmitter Misc ?1 Device by Does not apply route every 3 (three) months. ?  ?Dexlansoprazole 30 MG capsule DR ?Take 30 mg by mouth daily. ?  ?diclofenac 75 MG EC tablet ?Commonly known as: VOLTAREN ?Take 75 mg by mouth 2 (two) times daily. ?  ?fenofibrate 145 MG tablet ?Commonly known as: TRICOR ?Take 145 mg by mouth daily. ?  ?FLUoxetine 20 MG capsule ?Commonly known as: PROZAC ?Take 20 mg by mouth daily. Takes  at bedtime ?  ?FLUoxetine 40 MG capsule ?Commonly known as: PROZAC ?Take 40 mg by mouth daily. ?  ?gabapentin 300 MG  capsule ?Commonly known as: NEURONTIN ?TAKE 1 CAPSULE(300 MG) BY MOUTH THREE TIMES DAILY ?  ?glipiZIDE 5 MG 24 hr tablet ?Commonly known as: GLUCOTROL XL ?Take 1 tablet (5 mg total) by mouth daily with breakfast. ?  ?hydrocortisone 2.5 % rectal cream ?Commonly known as: ANUSOL-HC ?Place 1 application rectally 2 (two) times daily. ?  ?insulin aspart 100 UNIT/ML FlexPen ?Commonly known as: NOVOLOG ?Inject 10-16 Units into the skin 3 (three) times daily with meals. ?  ?insulin glargine 100 UNIT/ML Solostar Pen ?Commonly known as: LANTUS ?Inject 30 Units into the skin at bedtime. ?  ?isosorbide mononitrate 60 MG 24 hr tablet ?Commonly known as: IMDUR ?Take 1 tablet (60 mg total) by mouth daily. ?  ?metFORMIN 1000 MG tablet ?Commonly known as: GLUCOPHAGE ?2 tablet with meals ?  ?metoprolol succinate 100 MG 24 hr tablet ?Commonly known as: TOPROL-XL ?Take 1 tablet (100 mg total) by mouth daily. Take with or immediately following a meal. ?  ?montelukast 10 MG tablet ?Commonly known as: SINGULAIR ?Take 10 mg by mouth at bedtime. ?  ?nitroGLYCERIN 0.4 MG SL tablet ?Commonly known as: NITROSTAT ?Place 1 tablet (0.4 mg total)  under the tongue every 5 (five) minutes as needed for chest pain. ?  ?pantoprazole 40 MG tablet ?Commonly known as: PROTONIX ?Take 1 tablet (40 mg total) by mouth 2 (two) times daily before a meal. ?  ?Pen Needles 31

## 2021-05-06 NOTE — Addendum Note (Signed)
Addended by: Iris Pert on: 05/06/2021 12:21 PM ? ? Modules accepted: Orders ? ?

## 2021-05-06 NOTE — Patient Instructions (Signed)

## 2021-05-07 LAB — URINALYSIS, ROUTINE W REFLEX MICROSCOPIC
Bilirubin, UA: NEGATIVE
Ketones, UA: NEGATIVE
Nitrite, UA: NEGATIVE
Protein,UA: NEGATIVE
RBC, UA: NEGATIVE
Specific Gravity, UA: >1.03 — ABNORMAL HIGH (ref 1.005–1.030)
Urobilinogen, Ur: 0.2 mg/dL (ref 0.2–1.0)
pH, UA: 5.5 (ref 5.0–7.5)

## 2021-05-07 LAB — MICROSCOPIC EXAMINATION
RBC, Urine: NONE SEEN /hpf (ref 0–2)
Renal Epithel, UA: NONE SEEN /hpf

## 2021-05-08 ENCOUNTER — Other Ambulatory Visit: Payer: Medicaid Other | Admitting: Urology

## 2021-05-08 LAB — URINE CULTURE: Organism ID, Bacteria: NO GROWTH

## 2021-05-20 ENCOUNTER — Other Ambulatory Visit: Payer: Self-pay | Admitting: Urology

## 2021-06-16 ENCOUNTER — Encounter: Payer: Self-pay | Admitting: Gastroenterology

## 2021-06-16 NOTE — Progress Notes (Unsigned)
Referring Provider: Coolidge Breeze, FNP Primary Care Physician:  Coolidge Breeze, FNP Primary GI Physician: Dr. Gala Romney  Chief Complaint  Patient presents with   Follow-up    Food is still getting stuck in throat.     HPI:   Sandra [name] is a 46 y.o. female presenting today for follow-up of GERD, dysphagia, rectal bleeding, constipation s/p EGD and colonoscopy.   Last seen in our office 02/06/2021.  GERD had improved with increasing Protonix to 40 mg twice daily.  Noted breakthrough if she misses a dose.  She continued with solid food and pill dysphagia with daily symptoms.  Also with odynophagia.  Continued with intermittent rectal bleeding if passing a hard stool, typically about once a week with blood just on toilet tissue.  Reported hemorrhoids prolapse.  Chronic constipation with bowel movement every 3 days.  She was taking diclofenac for back pain as well as a 81 mg aspirin.  Plan to proceed with EGD with possible dilation, colonoscopy, start Linzess 72 mcg daily, continue Protonix, and continue Anusol rectal cream.  Procedures completed 02/17/2021: EGD: 2 cm hiatal hernia, esophageal mucosal changes consistent with short segment Barrett's s/p biopsy, esophagus also dilated, gastritis biopsied, normal examined duodenum.  Pathology with reactive gastropathy, negative for H. pylori, esophageal squamous and cardiac mucosa with intestinal metaplasia, consistent with Barrett's esophagus.  Recommended repeat EGD in 3 years.  Colonoscopy: Nonbleeding internal hemorrhoids, otherwise normal exam.  Recommended considering hemorrhoid banding if rectal bleeding continues despite conservative measures.   Today:  GERD: Well controlled on Pantoprazole BID.   Dysphagia: Dysphagia improved for 3 weeks to 1 months. Now pills and meats are getting stuck at her sternal notch. Will regurgitate it if she can, otherwise, she will wait and let it go down.   Constipation: A lot better. Taking Linzess  72 mcg samples and this is working very well.   Rectal bleeding: Resolved.   Past Medical History:  Diagnosis Date   ADHD (attention deficit hyperactivity disorder)    Allergic rhinitis    Anemia    hx of   Barrett's esophagus    Bipolar 1 disorder (HCC)    Bladder tumor    CAD (coronary artery disease)    Cancer ([place])    bladder   Constipation    Dilated cardiomyopathy ([place]) 12/20/2020   Echo 6/22 ([facility]): EF 43, mild MR, mild TR // Echo 3/22: EF 50-55   Dizziness    Full dentures    GERD (gastroesophageal reflux disease)    Gross hematuria    Hyperlipidemia    Hypertension    Stable angina (HCC)    Type 2 diabetes mellitus ([place])    Urgency of urination    dysuria, sui   Wears glasses     Past Surgical History:  Procedure Laterality Date   BALLOON DILATION N/A 02/17/2021   Procedure: BALLOON DILATION;  Surgeon: Eloise Harman, DO;  [facility];  Service: Endoscopy;  Laterality: N/A;   BIOPSY  02/17/2021   Procedure: BIOPSY;  Surgeon: Eloise Harman, DO;  [facility];  Service: Endoscopy;;   COLONOSCOPY WITH PROPOFOL N/A 02/17/2021   Surgeon: Eloise Harman, DO; nonbleeding internal hemorrhoids, otherwise normal exam.  Repeat in 10 years.   CYSTOSCOPY N/A 10/03/2015   Procedure: CYSTOSCOPY;  Surgeon: Irine Seal, MD;  [facility];  Service: Urology;  Laterality: N/A;   CYSTOSCOPY WITH STENT PLACEMENT Right 10/31/2015   Procedure: CYSTOSCOPY  WITH ATTEMPTED RIGHT URETERAL OPENING;  Surgeon: Irine Seal, MD;  Location: [facility];  Service: Urology;  Laterality: Right;   ESOPHAGOGASTRODUODENOSCOPY (EGD) WITH PROPOFOL N/A 02/17/2021   Surgeon: Eloise Harman, DO; 2 cm hiatal hernia, Barrett's esophagus, gastritis with biopsies negative for H. pylori.  S/p empiric esophageal dilation.  Repeat in 3 years.   FOOT SURGERY Left    September 2022, broke 7 bones in left foot   INTERSTIM IMPLANT PLACEMENT  N/A 03/14/2018   Procedure: Barrie [place]yme IMPLANT FIRST STAGE;  Surgeon: Cleon Gustin, MD;  [facility];  Service: Urology;  Laterality: N/A;   INTERSTIM IMPLANT PLACEMENT N/A 03/30/2018   Procedure: Barrie Lyme IMPLANT SECOND STAGE;  Surgeon: Cleon Gustin, MD;  [facility];  Service: Urology;  Laterality: N/A;   LEFT HEART CATH AND CORONARY ANGIOGRAPHY N/A 05/31/2020   Procedure: LEFT HEART CATH AND CORONARY ANGIOGRAPHY;  Surgeon: Troy Sine, MD;  Location: [place] [facility];  Service: Cardiovascular;  Laterality: N/A;   MULTIPLE TOOTH EXTRACTIONS  2015   OVARIAN CYST REMOVAL Right 2004 approx   SHOULDER OPEN ROTATOR CUFF REPAIR Left 04/20/2019   Procedure: ROTATOR CUFF REPAIR SHOULDER OPEN;  Surgeon: Carole Civil, MD;  Location[facility];  Service: Orthopedics;  Laterality: Left;   TONSILLECTOMY  12/30/2004   TOTAL ABDOMINAL HYSTERECTOMY W/ BILATERAL SALPINGOOPHORECTOMY  05/16/2004   TRANSURETHRAL RESECTION OF BLADDER TUMOR N/A 10/03/2015   Procedure: TRANSURETHRAL RESECTION OF BLADDER TUMOR (TURBT);  Surgeon: Irine Seal, MD;  Location: [facility];  Service: Urology;  Laterality: N/A;   TRANSURETHRAL RESECTION OF BLADDER TUMOR N/A 10/31/2015   Procedure: RE-STAGINGG TRANSURETHRAL RESECTION OF BLADDER TUMOR (TURBT);  Surgeon: Irine Seal, MD;  Location[facility];  Service: Urology;  Laterality: N/A;   TRANSURETHRAL RESECTION OF BLADDER TUMOR N/A 08/17/2016   Procedure: TRANSURETHRAL RESECTION OF BLADDER TUMOR (TURBT);  Surgeon: Cleon Gustin, MD;  [facility];  Service: Urology;  Laterality: N/A;    Current Outpatient Medications  Medication Sig Dispense Refill   ACCU-CHEK GUIDE test strip USE AS INSTRUCTED TO MONITOR GLUCOSE 4 TIMES DAILY BEFORE MEALS AND BEFORE BED. 400 strip 2   Accu-Chek Softclix Lancets lancets Use as instructed to monitor glucose 4 times daily 100 each 12   amLODipine (NORVASC) 5 MG tablet Take 1  tablet (5 mg total) by mouth daily. 90 tablet 3   aspirin EC 81 MG tablet Take 1 tablet (81 mg total) by mouth daily. Swallow whole. 30 tablet 11   Blood Glucose Monitoring Suppl (ACCU-CHEK GUIDE) w/Device KIT 1 Piece by Does not apply route as directed. 1 kit 0   Blood Pressure Monitoring (BLOOD PRESSURE CUFF) MISC See admin instructions.     buPROPion (WELLBUTRIN XL) 300 MG 24 hr tablet Take 300 mg by mouth every morning.     cloNIDine HCl (KAPVAY) 0.1 MG TB12 ER tablet 1 tablet at bedtime.     Continuous Blood Gluc Sensor (DEXCOM G6 SENSOR) MISC Apply new sensor every 10 days as directed 9 each 3   Continuous Blood Gluc Transmit (DEXCOM G6 TRANSMITTER) MISC 1 Device by Does not apply route every 3 (three) months. 3 each 1   Evolocumab (REPATHA SURECLICK) 094 MG/ML SOAJ Inject 1 Dose into the skin every 14 (fourteen) days. 2 mL 11   fenofibrate (TRICOR) 145 MG tablet Take 145 mg by mouth daily.     FLUoxetine (PROZAC) 20 MG capsule Take 20 mg by mouth daily.  Takes  at bedtime     FLUoxetine (PROZAC) 40 MG capsule Take 40 mg by mouth daily.     gabapentin (NEURONTIN) 300 MG capsule TAKE 1 CAPSULE(300 MG) BY MOUTH THREE TIMES DAILY 90 capsule 0   glipiZIDE (GLUCOTROL XL) 5 MG 24 hr tablet Take 1 tablet (5 mg total) by mouth daily with breakfast. 90 tablet 3   GLOBAL EASE INJECT PEN NEEDLES 31G X 5 MM MISC Inject into the skin.     hydrocortisone (ANUSOL-HC) 2.5 % rectal cream Place 1 application rectally 2 (two) times daily. 30 g 1   insulin aspart (NOVOLOG) 100 UNIT/ML FlexPen Inject 10-16 Units into the skin 3 (three) times daily with meals. 30 mL 3   insulin glargine (LANTUS) 100 UNIT/ML Solostar Pen Inject 30 Units into the skin at bedtime. 30 mL 3   Insulin Pen Needle (PEN NEEDLES) 31G X 6 MM MISC Use to inject insulin 4 times daily 100 each 11   isosorbide mononitrate (IMDUR) 60 MG 24 hr tablet Take 1 tablet (60 mg total) by mouth daily. 90 tablet 3   linaclotide (LINZESS) 72 MCG capsule  Take 1 capsule (72 mcg total) by mouth daily before breakfast. 30 capsule 5   metFORMIN (GLUCOPHAGE) 1000 MG tablet 2 tablet with meals 90 tablet 3   metoprolol succinate (TOPROL-XL) 100 MG 24 hr tablet Take 1 tablet (100 mg total) by mouth daily. Take with or immediately following a meal. 90 tablet 3   montelukast (SINGULAIR) 10 MG tablet Take 10 mg by mouth at bedtime.     nitroGLYCERIN (NITROSTAT) 0.4 MG SL tablet Place 1 tablet (0.4 mg total) under the tongue every 5 (five) minutes as needed for chest pain. 25 tablet 11   rosuvastatin (CRESTOR) 40 MG tablet Take 40 mg by mouth daily.     tamsulosin (FLOMAX) 0.4 MG CAPS capsule Take 1 capsule (0.4 mg total) by mouth at bedtime. 30 capsule 1   VYVANSE 40 MG capsule Take 40 mg by mouth every morning.     pantoprazole (PROTONIX) 40 MG tablet Take 1 tablet (40 mg total) by mouth 2 (two) times daily before a meal. 60 tablet 5   Current Facility-Administered Medications  Medication Dose Route Frequency Provider Last Rate Last Admin   betamethasone acetate-betamethasone sodium phosphate (CELESTONE) injection 3 mg  3 mg Intramuscular Once Daylene Katayama M, DPM        Allergies as of 06/18/2021 - Review Complete 06/18/2021  Allergen Reaction Noted   Ibuprofen Hives 05/12/2016   Benazepril Hives 11/25/2014   Diphenhydramine Hives 04/04/2020   Metronidazole Nausea And Vomiting 11/05/2020   Oxycodone  08/19/2016   Adhesive [tape] Rash 09/30/2015   Codeine Nausea And Vomiting and Rash 02/11/2021   Loratadine Rash    Nystatin-triamcinolone Rash 02/24/2018   Penicillins Rash 11/05/2020    Family History  Problem Relation Age of Onset   Hypertension Mother    Cancer Mother    Breast cancer Maternal Aunt    Lung cancer Maternal Uncle    Cancer Other    Diabetes Daughter    Colon cancer Neg Hx     Social History   Socioeconomic History   Marital status: Married    Spouse name: Not on file   Number of children: Not on file   Years of  education: Not on file   Highest education level: Not on file  Occupational History   Not on file  Tobacco Use   Smoking status: Former  Packs/day: 0.50    Years: 10.00    Pack years: 5.00    Types: Cigarettes    Quit date: 01/31/2021    Years since quitting: 0.3   Smokeless tobacco: Never   Tobacco comments:    02/06/21-no smoking past 5 days  Vaping Use   Vaping Use: Never used  Substance and Sexual Activity   Alcohol use: No   Drug use: No   Sexual activity: Not Currently    Birth control/protection: Surgical    Comment: hyst  Other Topics Concern   Not on file  Social History Narrative   Not on file   Social Determinants of Health   Financial Resource Strain: Not on file  Food Insecurity: Not on file  Transportation Needs: Not on file  Physical Activity: Not on file  Stress: Not on file  Social Connections: Not on file    Review of Systems: Gen: Denies fever, chills, cold or flulike symptoms, presyncope, syncope. CV: Denies chest pain, palpitations. Resp: Denies dyspnea, cough.   GI: See HPI Heme: See HPI  Physical Exam: BP 140/80   Pulse 90   Temp 97.9 F (36.6 C) (Temporal)   Ht '4\' 11"'  (1.499 m)   Wt 177 lb (80.3 kg)   BMI 35.75 kg/m  General:   Alert and oriented. No distress noted. Pleasant and cooperative.  Head:  Normocephalic and atraumatic. Eyes:  Conjuctiva clear without scleral icterus. Heart:  S1, S2 present without murmurs appreciated. Lungs:  Clear to auscultation bilaterally. No wheezes, rales, or rhonchi. No distress.  Abdomen:  +BS, soft, non-tender and non-distended. No rebound or guarding. No HSM or masses noted. Msk:  Symmetrical without gross deformities. Normal posture. Extremities:  Without edema. Neurologic:  Alert and  oriented x4 Psych:  Normal mood and affect.    Assessment:  46 year old female with GI history of GERD, dysphagia, rectal bleeding, constipation, presenting today for follow-up s/p EGD and colonoscopy  completed in February 2023.  EGD revealed 2 cm hiatal hernia, Barrett's esophagus, nothing to explain dysphagia s/p empiric dilation, reactive gastropathy negative for H. pylori.  Colonoscopy with nonbleeding internal hemorrhoids, otherwise normal exam.  GERD: Chronic.  Well-controlled on Protonix 40 mg twice daily.  Dysphagia: Symptoms improved for 3-4 weeks following empiric esophageal dilation in February 2023, but have recurred.  Primarily having trouble with pills and meats getting hung at the sternal notch.  Query whether she has underlying esophageal dysmotility.  We will pursue barium pill esophagram for further evaluation.  Barrett's esophagus: Due for surveillance EGD in February 2026.   Constipation: Chronic.  Well-controlled on Linzess 72 mcg, currently receiving samples, but I will send in a prescription today.  Rectal bleeding: Secondary to internal hemorrhoids, resolved with Anusol rectal cream and better management of constipation.   Plan:  BPE. Eat slowly, take small bites, chew thoroughly, drink plenty of liquids throughout meals, avoid tough textures, chop meats finely. Continue Protonix 40 mg twice daily.  Refills sent to pharmacy. Reinforced GERD diet/lifestyle.  Separate written instructions provided. Continue Linzess 72 mcg daily.  Refills sent to pharmacy. Further recommendations and follow-up date TBD pending barium pill esophagram.   Aliene Altes, PA-C [facility] 06/18/2021

## 2021-06-17 ENCOUNTER — Encounter: Payer: Self-pay | Admitting: Cardiovascular Disease

## 2021-06-17 ENCOUNTER — Ambulatory Visit (HOSPITAL_COMMUNITY)
Admission: RE | Admit: 2021-06-17 | Discharge: 2021-06-17 | Disposition: A | Discharge status: Home / Self Care | Payer: Medicaid Other | Source: Ambulatory Visit | Attending: Cardiovascular Disease | Admitting: Cardiovascular Disease

## 2021-06-17 DIAGNOSIS — I255 Ischemic cardiomyopathy: Secondary | ICD-10-CM | POA: Diagnosis present

## 2021-06-17 LAB — ECHOCARDIOGRAM COMPLETE
Area-P 1/2: 3.85 cm2
S' Lateral: 3 cm

## 2021-06-17 NOTE — Progress Notes (Signed)
*  PRELIMINARY RESULTS* Echocardiogram 2D Echocardiogram has been performed.  Sandra [name] 06/17/2021, 10:00 AM

## 2021-06-18 ENCOUNTER — Telehealth: Payer: Self-pay | Admitting: *Deleted

## 2021-06-18 ENCOUNTER — Encounter: Payer: Self-pay | Admitting: Urology

## 2021-06-18 ENCOUNTER — Encounter: Payer: Self-pay | Admitting: Gastroenterology

## 2021-06-18 ENCOUNTER — Ambulatory Visit (INDEPENDENT_AMBULATORY_CARE_PROVIDER_SITE_OTHER): Payer: Medicaid Other | Admitting: Urology

## 2021-06-18 ENCOUNTER — Ambulatory Visit: Payer: Medicaid Other | Admitting: Gastroenterology

## 2021-06-18 VITALS — BP 140/80 | HR 90 | Temp 97.9°F | Ht 59.0 in | Wt 177.0 lb

## 2021-06-18 VITALS — BP 135/96 | HR 94 | Ht 59.0 in | Wt 177.0 lb

## 2021-06-18 DIAGNOSIS — K219 Gastro-esophageal reflux disease without esophagitis: Secondary | ICD-10-CM

## 2021-06-18 DIAGNOSIS — R1310 Dysphagia, unspecified: Secondary | ICD-10-CM | POA: Diagnosis not present

## 2021-06-18 DIAGNOSIS — K625 Hemorrhage of anus and rectum: Secondary | ICD-10-CM | POA: Diagnosis not present

## 2021-06-18 DIAGNOSIS — N3281 Overactive bladder: Secondary | ICD-10-CM | POA: Diagnosis not present

## 2021-06-18 DIAGNOSIS — K5904 Chronic idiopathic constipation: Secondary | ICD-10-CM

## 2021-06-18 DIAGNOSIS — R3912 Poor urinary stream: Secondary | ICD-10-CM | POA: Diagnosis not present

## 2021-06-18 LAB — MICROSCOPIC EXAMINATION
RBC, Urine: NONE SEEN /hpf (ref 0–2)
Renal Epithel, UA: NONE SEEN /hpf

## 2021-06-18 LAB — URINALYSIS, ROUTINE W REFLEX MICROSCOPIC
Bilirubin, UA: NEGATIVE
Ketones, UA: NEGATIVE
Nitrite, UA: NEGATIVE
Protein,UA: NEGATIVE
RBC, UA: NEGATIVE
Specific Gravity, UA: 1.025 (ref 1.005–1.030)
Urobilinogen, Ur: 0.2 mg/dL (ref 0.2–1.0)
pH, UA: 5.5 (ref 5.0–7.5)

## 2021-06-18 LAB — BLADDER SCAN AMB NON-IMAGING: Scan Result: 0

## 2021-06-18 MED ORDER — PANTOPRAZOLE SODIUM 40 MG PO TBEC: 40.0000 mg | DELAYED_RELEASE_TABLET | Freq: Two times a day (BID) | ORAL | 5 refills | Status: DC

## 2021-06-18 MED ORDER — LINACLOTIDE 72 MCG PO CAPS: 72.0000 ug | ORAL_CAPSULE | Freq: Every day | ORAL | 5 refills | Status: DC

## 2021-06-18 MED ORDER — TAMSULOSIN HCL 0.4 MG PO CAPS
0.4000 mg | ORAL_CAPSULE | Freq: Every day | ORAL | 11 refills | Status: DC
Start: 2021-06-18 — End: 2021-07-24

## 2021-06-18 NOTE — Patient Instructions (Signed)

## 2021-06-18 NOTE — Telephone Encounter (Signed)
BPE scheduled for 6/19, arrival 9:30am, npo midnight  Called pt, LMOVM to call back. Also sent [facility] letter with appt information

## 2021-06-18 NOTE — Progress Notes (Signed)
06/18/2021 11:24 AM   Deandrea [name] 06-09-1975 [id number]  Referring provider: [facility], [address] [place],  [place] [address]  Urinary frequency   HPI: [name] is a 46yo here for followup for urinary frequency and weak urinary stream. Since starting the flomax her urine stream is improved. She has urinary frequency every 60-90 minutes. She has stable urinary urgency but no urge incontinence. She can feel the device is working. She doe snot feel the device is shocking her. No pain with the device. She drinks 40oz of mountain dew daily.    PMH: Past Medical History:  Diagnosis Date   ADHD (attention deficit hyperactivity disorder)    Allergic rhinitis    Anemia    hx of   Barrett's esophagus    Bipolar 1 disorder (HCC)    Bladder tumor    CAD (coronary artery disease)    Cancer ([place])    bladder   Constipation    Dilated cardiomyopathy ([place]) 12/20/2020   Echo 6/22 ([facility]): EF 43, mild MR, mild TR // Echo 3/22: EF 50-55   Dizziness    Full dentures    GERD (gastroesophageal reflux disease)    Gross hematuria    Hyperlipidemia    Hypertension    Stable angina (HCC)    Type 2 diabetes mellitus ([place])    Urgency of urination    dysuria, sui   Wears glasses     Surgical History: Past Surgical History:  Procedure Laterality Date   BALLOON DILATION N/A 02/17/2021   Procedure: BALLOON DILATION;  Surgeon: Eloise Harman, DO;  [facility];  Service: Endoscopy;  Laterality: N/A;   BIOPSY  02/17/2021   Procedure: BIOPSY;  Surgeon: Eloise Harman, DO;  [facility];  Service: Endoscopy;;   COLONOSCOPY WITH PROPOFOL N/A 02/17/2021   Surgeon: Eloise Harman, DO; nonbleeding internal hemorrhoids, otherwise normal exam.  Repeat in 10 years.   CYSTOSCOPY N/A 10/03/2015   Procedure: CYSTOSCOPY;  Surgeon: Irine Seal, MD;  [facility];  Service: Urology;  Laterality: N/A;   CYSTOSCOPY WITH STENT PLACEMENT Right  10/31/2015   Procedure: CYSTOSCOPY WITH ATTEMPTED RIGHT URETERAL OPENING;  Surgeon: Irine Seal, MD;  Location: [facility];  Service: Urology;  Laterality: Right;   ESOPHAGOGASTRODUODENOSCOPY (EGD) WITH PROPOFOL N/A 02/17/2021   Surgeon: Eloise Harman, DO; 2 cm hiatal hernia, Barrett's esophagus, gastritis with biopsies negative for H. pylori.  S/p empiric esophageal dilation.  Repeat in 3 years.   FOOT SURGERY Left    September 2022, broke 7 bones in left foot   INTERSTIM IMPLANT PLACEMENT N/A 03/14/2018   Procedure: Barrie Lyme IMPLANT FIRST STAGE;  Surgeon: Cleon Gustin, MD;  Location: [facility];  Service: Urology;  Laterality: N/A;   INTERSTIM IMPLANT PLACEMENT N/A 03/30/2018   Procedure: Barrie Lyme IMPLANT SECOND STAGE;  Surgeon: Cleon Gustin, MD;  Location: [facility];  Service: Urology;  Laterality: N/A;   LEFT HEART CATH AND CORONARY ANGIOGRAPHY N/A 05/31/2020   Procedure: LEFT HEART CATH AND CORONARY ANGIOGRAPHY;  Surgeon: Troy Sine, MD;  Location: [facility];  Service: Cardiovascular;  Laterality: N/A;   MULTIPLE TOOTH EXTRACTIONS  2015   OVARIAN CYST REMOVAL Right 2004 approx   SHOULDER OPEN ROTATOR CUFF REPAIR Left 04/20/2019   Procedure: ROTATOR CUFF REPAIR SHOULDER OPEN;  Surgeon: Carole Civil, MD;  Location: [facility];  Service: Orthopedics;  Laterality: Left;   TONSILLECTOMY  12/30/2004   TOTAL ABDOMINAL HYSTERECTOMY W/ BILATERAL SALPINGOOPHORECTOMY  05/16/2004   TRANSURETHRAL RESECTION OF BLADDER TUMOR N/A 10/03/2015   Procedure: TRANSURETHRAL RESECTION OF BLADDER TUMOR (TURBT);  Surgeon: Irine Seal, MD;  Location: [facility];  Service: Urology;  Laterality: N/A;   TRANSURETHRAL RESECTION OF BLADDER TUMOR N/A 10/31/2015   Procedure: RE-STAGINGG TRANSURETHRAL RESECTION OF BLADDER TUMOR (TURBT);  Surgeon: Irine Seal, MD;  Location: [facility];  Service: Urology;  Laterality: N/A;   TRANSURETHRAL RESECTION OF  BLADDER TUMOR N/A 08/17/2016   Procedure: TRANSURETHRAL RESECTION OF BLADDER TUMOR (TURBT);  Surgeon: Cleon Gustin, MD;  Location: [facility];  Service: Urology;  Laterality: N/A;    Home Medications:  Allergies as of 06/18/2021       Reactions   Ibuprofen Hives   itching itching itching   Benazepril Hives   Diphenhydramine Hives   Other reaction(s): hives Other reaction(s): hives   Metronidazole Nausea And Vomiting   Other reaction(s): nausea Other reaction(s): nausea   Oxycodone    Pt has tolerated hydromorphone in the past and takes Norco at home.   Adhesive [tape] Rash   Codeine Nausea And Vomiting, Rash   Other reaction(s): hives   Loratadine Rash   Nystatin-triamcinolone Rash   Penicillins Rash   Has patient had a PCN reaction causing immediate rash, facial/tongue/throat swelling, SOB or lightheadedness with hypotension: No Has patient had a PCN reaction causing severe rash involving mucus membranes or skin necrosis: Yes Has patient had a PCN reaction that required hospitalization: No Has patient had a PCN reaction occurring within the last 10 years: no If all of the above answers are "NO", then may proceed with Cephalosporin use. Other reaction(s): nausea Other reaction(s): nausea        Medication List        Accurate as of June 18, 2021 11:24 AM. If you have any questions, ask your nurse or doctor.          STOP taking these medications    Dexlansoprazole 30 MG capsule DR Stopped by: Erenest Rasher, PA-C   diclofenac 75 MG EC tablet Commonly known as: VOLTAREN Stopped by: Erenest Rasher, PA-C       TAKE these medications    Accu-Chek Guide test strip Generic drug: glucose blood USE AS INSTRUCTED TO MONITOR GLUCOSE 4 TIMES DAILY BEFORE MEALS AND BEFORE BED.   Accu-Chek Guide w/Device Kit 1 Piece by Does not apply route as directed.   Accu-Chek Softclix Lancets lancets Use as instructed to monitor glucose 4 times daily   amLODipine 5  MG tablet Commonly known as: NORVASC Take 1 tablet (5 mg total) by mouth daily.   aspirin EC 81 MG tablet Take 1 tablet (81 mg total) by mouth daily. Swallow whole.   Blood Pressure Cuff Misc See admin instructions.   buPROPion 300 MG 24 hr tablet Commonly known as: WELLBUTRIN XL Take 300 mg by mouth every morning.   cloNIDine HCl 0.1 MG Tb12 ER tablet Commonly known as: KAPVAY 1 tablet at bedtime.   Dexcom G6 Sensor Misc Apply new sensor every 10 days as directed   Dexcom G6 Transmitter Misc 1 Device by Does not apply route every 3 (three) months.   fenofibrate 145 MG tablet Commonly known as: TRICOR Take 145 mg by mouth daily.   FLUoxetine 20 MG capsule Commonly known as: PROZAC Take 20 mg by mouth daily. Takes  at bedtime   FLUoxetine 40 MG capsule Commonly known as:  PROZAC Take 40 mg by mouth daily.   gabapentin 300 MG capsule Commonly known as: NEURONTIN TAKE 1 CAPSULE(300 MG) BY MOUTH THREE TIMES DAILY   glipiZIDE 5 MG 24 hr tablet Commonly known as: GLUCOTROL XL Take 1 tablet (5 mg total) by mouth daily with breakfast.   hydrocortisone 2.5 % rectal cream Commonly known as: ANUSOL-HC Place 1 application rectally 2 (two) times daily.   insulin aspart 100 UNIT/ML FlexPen Commonly known as: NOVOLOG Inject 10-16 Units into the skin 3 (three) times daily with meals.   insulin glargine 100 UNIT/ML Solostar Pen Commonly known as: LANTUS Inject 30 Units into the skin at bedtime.   isosorbide mononitrate 60 MG 24 hr tablet Commonly known as: IMDUR Take 1 tablet (60 mg total) by mouth daily.   linaclotide 72 MCG capsule Commonly known as: Linzess Take 1 capsule (72 mcg total) by mouth daily before breakfast. Started by: Erenest Rasher, PA-C   metFORMIN 1000 MG tablet Commonly known as: GLUCOPHAGE 2 tablet with meals   metoprolol succinate 100 MG 24 hr tablet Commonly known as: TOPROL-XL Take 1 tablet (100 mg total) by mouth daily. Take with or  immediately following a meal.   montelukast 10 MG tablet Commonly known as: SINGULAIR Take 10 mg by mouth at bedtime.   nitroGLYCERIN 0.4 MG SL tablet Commonly known as: NITROSTAT Place 1 tablet (0.4 mg total) under the tongue every 5 (five) minutes as needed for chest pain.   pantoprazole 40 MG tablet Commonly known as: PROTONIX Take 1 tablet (40 mg total) by mouth 2 (two) times daily before a meal.   Pen Needles 31G X 6 MM Misc Use to inject insulin 4 times daily   Global Ease Inject Pen Needles 31G X 5 MM Misc Generic drug: Insulin Pen Needle Inject into the skin.   Repatha SureClick 923 MG/ML Soaj Generic drug: Evolocumab Inject 1 Dose into the skin every 14 (fourteen) days.   rosuvastatin 40 MG tablet Commonly known as: CRESTOR Take 40 mg by mouth daily.   tamsulosin 0.4 MG Caps capsule Commonly known as: FLOMAX Take 1 capsule (0.4 mg total) by mouth at bedtime.   Vyvanse 40 MG capsule Generic drug: lisdexamfetamine Take 40 mg by mouth every morning.        Allergies:  Allergies  Allergen Reactions   Ibuprofen Hives    itching itching itching   Benazepril Hives   Diphenhydramine Hives    Other reaction(s): hives Other reaction(s): hives   Metronidazole Nausea And Vomiting    Other reaction(s): nausea Other reaction(s): nausea   Oxycodone     Pt has tolerated hydromorphone in the past and takes Norco at home.   Adhesive [Tape] Rash   Codeine Nausea And Vomiting and Rash    Other reaction(s): hives   Loratadine Rash   Nystatin-Triamcinolone Rash   Penicillins Rash    Has patient had a PCN reaction causing immediate rash, facial/tongue/throat swelling, SOB or lightheadedness with hypotension: No Has patient had a PCN reaction causing severe rash involving mucus membranes or skin necrosis: Yes Has patient had a PCN reaction that required hospitalization: No Has patient had a PCN reaction occurring within the last 10 years: no  If all of the above  answers are "NO", then may proceed with Cephalosporin use. Other reaction(s): nausea Other reaction(s): nausea    Family History: Family History  Problem Relation Age of Onset   Hypertension Mother    Cancer Mother    Breast cancer Maternal  Aunt    Lung cancer Maternal Uncle    Cancer Other    Diabetes Daughter    Colon cancer Neg Hx     Social History:  reports that she quit smoking about 4 months ago. Her smoking use included cigarettes. She has a 5.00 pack-year smoking history. She has never used smokeless tobacco. She reports that she does not drink alcohol and does not use drugs.  ROS: All other review of systems were reviewed and are negative except what is noted above in HPI  Physical Exam: BP (!) 135/96   Pulse 94   Ht _0  (1.499 m)   Wt 177 lb (80.3 kg)   BMI 35.75 kg/m   Constitutional:  Alert and oriented, No acute distress. HEENT: [place] AT, moist mucus membranes.  Trachea midline, no masses. Cardiovascular: No clubbing, cyanosis, or edema. Respiratory: Normal respiratory effort, no increased work of breathing. GI: Abdomen is soft, nontender, nondistended, no abdominal masses GU: No CVA tenderness.  Lymph: No cervical or inguinal lymphadenopathy. Skin: No rashes, bruises or suspicious lesions. Neurologic: Grossly intact, no focal deficits, moving all 4 extremities. Psychiatric: Normal mood and affect.  Laboratory Data: Lab Results  Component Value Date   WBC 8.8 01/23/2021   HGB 12.3 01/23/2021   HCT 40.0 01/23/2021   MCV 82.6 01/23/2021   PLT 425 (H) 01/23/2021    Lab Results  Component Value Date   CREATININE 0.84 01/23/2021    No results found for: PSA  No results found for: TESTOSTERONE  Lab Results  Component Value Date   HGBA1C 7.2 (A) 02/24/2021    Urinalysis    Component Value Date/Time   COLORURINE STRAW (A) 04/08/2020 1800   APPEARANCEUR Clear 05/06/2021 1325   LABSPEC 1.028 04/08/2020 1800   PHURINE 5.0 04/08/2020 1800    GLUCOSEU Trace (A) 05/06/2021 1325   HGBUR NEGATIVE 04/08/2020 1800   BILIRUBINUR Negative 05/06/2021 1325   KETONESUR NEGATIVE 04/08/2020 1800   PROTEINUR Negative 05/06/2021 1325   PROTEINUR NEGATIVE 04/08/2020 1800   UROBILINOGEN 0.2 03/01/2011 1920   NITRITE Negative 05/06/2021 1325   NITRITE NEGATIVE 04/08/2020 1800   LEUKOCYTESUR 1+ (A) 05/06/2021 1325   LEUKOCYTESUR NEGATIVE 04/08/2020 1800    Lab Results  Component Value Date   LABMICR See below: 05/06/2021   WBCUA 11-30 (A) 05/06/2021   LABEPIT 0-10 05/06/2021   MUCUS Present 03/27/2021   BACTERIA Many (A) 05/06/2021    Pertinent Imaging:  No results found for this or any previous visit.  No results found for this or any previous visit.  No results found for this or any previous visit.  No results found for this or any previous visit.  Results for orders placed during the hospital encounter of 01/08/16  US Renal  Narrative CLINICAL DATA:  Malignant tumor of trigone of bladder. Cystoscopy and trans urethral resection of bladder tumor 10/03/2015 and 10/31/2015.  EXAM: RENAL / URINARY TRACT ULTRASOUND COMPLETE  COMPARISON:  None.  FINDINGS: Right Kidney:  Length: 12.0 cm, within normal limits. Echogenicity within normal limits. No mass or hydronephrosis visualized.  Left Kidney:  Length: 12.4 cm, within normal limits. Echogenicity within normal limits. No mass or hydronephrosis visualized.  Bladder:  The bladder is not well distended.  No discrete lesions are evident.  IMPRESSION: Negative bilateral renal ultrasound.   Electronically Signed By: San Morelle M.D. On: 01/08/2016 12:27  No results found for this or any previous visit.  No results found for this or any previous visit.  No results found for this or any previous visit.   Assessment & Plan:    1. OAB (overactive bladder) -We will contact [employer] for troublshooting Interstim - BLADDER SCAN AMB NON-IMAGING -  Urinalysis, Routine w reflex microscopic  2. Weak urinary stream -continue flomax 0.12m daily.    No follow-ups on file.  PNicolette Bang MD  [facility] [place]

## 2021-06-18 NOTE — Progress Notes (Signed)
post void residual =0mL 

## 2021-06-18 NOTE — Patient Instructions (Signed)
We will arrange to have a swallowing study at [facility].  Swallowing precautions:  Eat slowly, take small bites, chew thoroughly, drink plenty of liquids throughout meals.  Avoid trough textures All meats should be chopped finely.  If something gets hung in your esophagus and will not come up or go down, proceed to the emergency room.    Continue taking Protonix 40 mg twice daily 30 minutes before breakfast and dinner.  I have sent refills to your pharmacy.  Follow a GERD diet:  Avoid fried, fatty, greasy, spicy, citrus foods. Avoid caffeine and carbonated beverages. Avoid chocolate. Try eating 4-6 small meals a day rather than 3 large meals. Do not eat within 3 hours of laying down. Prop head of bed up on wood or bricks to create a 6 inch incline.  Continue taking Linzess 72 mcg daily 30 minutes before breakfast for constipation.  I have sent refills to your pharmacy.  We will have further recommendations for you after your swallowing study and determine follow-up time at that point.   It was great to see you again today!  Aliene Altes, PA-C [facility]

## 2021-06-24 ENCOUNTER — Telehealth (INDEPENDENT_AMBULATORY_CARE_PROVIDER_SITE_OTHER): Payer: Medicaid Other | Admitting: Internal Medicine

## 2021-06-24 ENCOUNTER — Encounter: Payer: Self-pay | Admitting: Internal Medicine

## 2021-06-24 ENCOUNTER — Ambulatory Visit (INDEPENDENT_AMBULATORY_CARE_PROVIDER_SITE_OTHER): Payer: Medicaid Other | Admitting: Nurse Practitioner

## 2021-06-24 ENCOUNTER — Encounter: Payer: Self-pay | Admitting: Nurse Practitioner

## 2021-06-24 ENCOUNTER — Telehealth: Payer: Self-pay

## 2021-06-24 VITALS — BP 143/93 | HR 87 | Ht 59.0 in | Wt 177.0 lb

## 2021-06-24 DIAGNOSIS — I10 Essential (primary) hypertension: Secondary | ICD-10-CM | POA: Diagnosis not present

## 2021-06-24 DIAGNOSIS — E782 Mixed hyperlipidemia: Secondary | ICD-10-CM

## 2021-06-24 DIAGNOSIS — E1165 Type 2 diabetes mellitus with hyperglycemia: Secondary | ICD-10-CM

## 2021-06-24 DIAGNOSIS — E7801 Familial hypercholesterolemia: Secondary | ICD-10-CM | POA: Diagnosis not present

## 2021-06-24 DIAGNOSIS — I25119 Atherosclerotic heart disease of native coronary artery with unspecified angina pectoris: Secondary | ICD-10-CM

## 2021-06-24 LAB — POCT GLYCOSYLATED HEMOGLOBIN (HGB A1C): HbA1c POC (<> result, manual entry): 8.1 % (ref 4.0–5.6)

## 2021-06-24 LAB — POCT UA - MICROALBUMIN
Creatinine, POC: 300 mg/dL
Microalbumin Ur, POC: 80 mg/L

## 2021-06-24 MED ORDER — DEXCOM G6 TRANSMITTER MISC
1.0000 | 1 refills | Status: DC
Start: 2021-06-24 — End: 2021-10-14

## 2021-06-24 MED ORDER — OMNIPOD DASH PODS (GEN 4) MISC: 3 refills | Status: DC

## 2021-06-24 MED ORDER — INSULIN GLARGINE 100 UNIT/ML SOLOSTAR PEN: 45.0000 [IU] | PEN_INJECTOR | Freq: Every day | SUBCUTANEOUS | 0 refills | Status: DC

## 2021-06-24 MED ORDER — DEXCOM G6 SENSOR MISC: 3 refills | Status: DC

## 2021-06-24 MED ORDER — FENOFIBRATE 145 MG PO TABS: 145.0000 mg | ORAL_TABLET | Freq: Every day | ORAL | 3 refills | Status: DC

## 2021-06-24 MED ORDER — ROSUVASTATIN CALCIUM 40 MG PO TABS: 40.0000 mg | ORAL_TABLET | Freq: Every day | ORAL | 3 refills | Status: DC

## 2021-06-24 MED ORDER — OMNIPOD DASH INTRO (GEN 4) KIT: PACK | 0 refills | Status: DC

## 2021-06-24 MED ORDER — REPATHA SURECLICK 140 MG/ML ~~LOC~~ SOAJ: 1.0000 | SUBCUTANEOUS | 11 refills | Status: DC

## 2021-06-24 NOTE — Telephone Encounter (Signed)
-----   Message from Fidel Levy, RN sent at 06/24/2021  9:02 AM EDT ----- Regarding: needs new BP cuff Uvaldo Bristle! This patient said you had gotten her a BP cuff previously but she said it is not working,despite changing batteries, etc. Can you please contact her about getting a new one? She said she needs to be checking her BP at home.   Thanks

## 2021-06-24 NOTE — Patient Instructions (Signed)
Medication Instructions:  CONTINUE all current medications Repatha, Crestor, Fenofibrate have been refilled  *If you need a refill on your cardiac medications before your next appointment, please call your pharmacy*   Lab Work: FASTING lab work before August visit with Dr. Johnsie Cancel OR go to this appointment fasting so you can complete the labs that day   If you have labs (blood work) drawn today and your tests are completely normal, you will receive your results only by: [place] (if you have MyChart) OR A paper copy in the mail If you have any lab test that is abnormal or we need to change your treatment, we will call you to review the results.   Follow-Up: At [facility], you and your health needs are our priority.  As part of our continuing mission to provide you with exceptional heart care, we have created designated Provider Care Teams.  These Care Teams include your primary Cardiologist (physician) and Advanced Practice Providers (APPs -  Physician Assistants and Nurse Practitioners) who all work together to provide you with the care you need, when you need it.  We recommend signing up for the patient portal called "MyChart".  Sign up information is provided on this After Visit Summary.  MyChart is used to connect with patients for Virtual Visits (Telemedicine).  Patients are able to view lab/test results, encounter notes, upcoming appointments, etc.  Non-urgent messages can be sent to your provider as well.   To learn more about what you can do with MyChart, go to [email or web address].    Your next appointment:   12 months with Dr. Debara Pickett -- lipid clinic

## 2021-06-24 NOTE — Patient Instructions (Signed)
Diabetes Mellitus and Foot Care Foot care is an important part of your health, especially when you have diabetes. Diabetes may cause you to have problems because of poor blood flow (circulation) to your feet and legs, which can cause your skin to: Become thinner and drier. Break more easily. Heal more slowly. Peel and crack. You may also have nerve damage (neuropathy) in your legs and feet, causing decreased feeling in them. This means that you may not notice minor injuries to your feet that could lead to more serious problems. Noticing and addressing any potential problems early is the best way to prevent future foot problems. How to care for your feet Foot hygiene  Wash your feet daily with warm water and mild soap. Do not use hot water. Then, pat your feet and the areas between your toes until they are completely dry. Do not soak your feet as this can dry your skin. Trim your toenails straight across. Do not dig under them or around the cuticle. File the edges of your nails with an emery board or nail file. Apply a moisturizing lotion or petroleum jelly to the skin on your feet and to dry, brittle toenails. Use lotion that does not contain alcohol and is unscented. Do not apply lotion between your toes. Shoes and socks Wear clean socks or stockings every day. Make sure they are not too tight. Do not wear knee-high stockings since they may decrease blood flow to your legs. Wear shoes that fit properly and have enough cushioning. Always look in your shoes before you put them on to be sure there are no objects inside. To break in new shoes, wear them for just a few hours a day. This prevents injuries on your feet. Wounds, scrapes, corns, and calluses  Check your feet daily for blisters, cuts, bruises, sores, and redness. If you cannot see the bottom of your feet, use a mirror or ask someone for help. Do not cut corns or calluses or try to remove them with medicine. If you find a minor scrape,  cut, or break in the skin on your feet, keep it and the skin around it clean and dry. You may clean these areas with mild soap and water. Do not clean the area with peroxide, alcohol, or iodine. If you have a wound, scrape, corn, or callus on your foot, look at it several times a day to make sure it is healing and not infected. Check for: Redness, swelling, or pain. Fluid or blood. Warmth. Pus or a bad smell. General tips Do not cross your legs. This may decrease blood flow to your feet. Do not use heating pads or hot water bottles on your feet. They may burn your skin. If you have lost feeling in your feet or legs, you may not know this is happening until it is too late. Protect your feet from hot and cold by wearing shoes, such as at the beach or on hot pavement. Schedule a complete foot exam at least once a year (annually) or more often if you have foot problems. Report any cuts, sores, or bruises to your health care provider immediately. Where to find more information American Diabetes Association: www.diabetes.org Association of Diabetes Care & Education Specialists: www.diabeteseducator.org Contact a health care provider if: You have a medical condition that increases your risk of infection and you have any cuts, sores, or bruises on your feet. You have an injury that is not healing. You have redness on your legs or feet. You   feel burning or tingling in your legs or feet. You have pain or cramps in your legs and feet. Your legs or feet are numb. Your feet always feel cold. You have pain around any toenails. Get help right away if: You have a wound, scrape, corn, or callus on your foot and: You have pain, swelling, or redness that gets worse. You have fluid or blood coming from the wound, scrape, corn, or callus. Your wound, scrape, corn, or callus feels warm to the touch. You have pus or a bad smell coming from the wound, scrape, corn, or callus. You have a fever. You have a red  line going up your leg. Summary Check your feet every day for blisters, cuts, bruises, sores, and redness. Apply a moisturizing lotion or petroleum jelly to the skin on your feet and to dry, brittle toenails. Wear shoes that fit properly and have enough cushioning. If you have foot problems, report any cuts, sores, or bruises to your health care provider immediately. Schedule a complete foot exam at least once a year (annually) or more often if you have foot problems. This information is not intended to replace advice given to you by your health care provider. Make sure you discuss any questions you have with your health care provider. Document Revised: 07/20/2019 Document Reviewed: 07/20/2019 Elsevier Patient Education  2023 [employer].  

## 2021-06-24 NOTE — Progress Notes (Signed)
06/24/2021, 2:32 PM                   Endocrinology follow-up note   Subjective:    Patient ID: Sandra [name], female    DOB: 1975/03/09.  Sandra [name] is being seen in follow-up  for management of currently uncontrolled symptomatic type 2  diabetes requested by  Coolidge Breeze, FNP.   Past Medical History:  Diagnosis Date   ADHD (attention deficit hyperactivity disorder)    Allergic rhinitis    Anemia    hx of   Barrett's esophagus    Bipolar 1 disorder (HCC)    Bladder tumor    CAD (coronary artery disease)    Cancer ([place])    bladder   Constipation    Dilated cardiomyopathy ([place]) 12/20/2020   Echo 6/22 ([facility]): EF 43, mild MR, mild TR // Echo 3/22: EF 50-55   Dizziness    Full dentures    GERD (gastroesophageal reflux disease)    Gross hematuria    Hyperlipidemia    Hypertension    Stable angina (HCC)    Type 2 diabetes mellitus ([place])    Urgency of urination    dysuria, sui   Wears glasses     Past Surgical History:  Procedure Laterality Date   BALLOON DILATION N/A 02/17/2021   Procedure: BALLOON DILATION;  Surgeon: Eloise Harman, DO;  [facility];  Service: Endoscopy;  Laterality: N/A;   BIOPSY  02/17/2021   Procedure: BIOPSY;  Surgeon: Eloise Harman, DO;  [facility];  Service: Endoscopy;;   COLONOSCOPY WITH PROPOFOL N/A 02/17/2021   Surgeon: Eloise Harman, DO; nonbleeding internal hemorrhoids, otherwise normal exam.  Repeat in 10 years.   CYSTOSCOPY N/A 10/03/2015   Procedure: CYSTOSCOPY;  Surgeon: Irine Seal, MD;  Location: [facility];  Service: Urology;  Laterality: N/A;   CYSTOSCOPY WITH STENT PLACEMENT Right 10/31/2015   Procedure: CYSTOSCOPY WITH ATTEMPTED RIGHT URETERAL OPENING;  Surgeon: Irine Seal, MD;  Location: [facility];  Service: Urology;  Laterality: Right;   ESOPHAGOGASTRODUODENOSCOPY (EGD) WITH  PROPOFOL N/A 02/17/2021   Surgeon: Eloise Harman, DO; 2 cm hiatal hernia, Barrett's esophagus, gastritis with biopsies negative for H. pylori.  S/p empiric esophageal dilation.  Repeat in 3 years.   FOOT SURGERY Left    September 2022, broke 7 bones in left foot   INTERSTIM IMPLANT PLACEMENT N/A 03/14/2018   Procedure: Barrie Lyme IMPLANT FIRST STAGE;  Surgeon: Cleon Gustin, MD;  Location: [facility];  Service: Urology;  Laterality: N/A;   INTERSTIM IMPLANT PLACEMENT N/A 03/30/2018   Procedure: Barrie Lyme IMPLANT SECOND STAGE;  Surgeon: Cleon Gustin, MD;  Location: [facility];  Service: Urology;  Laterality: N/A;   LEFT HEART CATH AND CORONARY ANGIOGRAPHY N/A 05/31/2020   Procedure: LEFT HEART CATH AND CORONARY ANGIOGRAPHY;  Surgeon: Troy Sine, MD;  Location: [place] [facility];  Service: Cardiovascular;  Laterality: N/A;   MULTIPLE TOOTH EXTRACTIONS  2015   OVARIAN CYST REMOVAL Right 2004 approx   SHOULDER OPEN ROTATOR CUFF REPAIR Left 04/20/2019   Procedure: ROTATOR CUFF REPAIR SHOULDER OPEN;  Surgeon: Carole Civil, MD;  Location: [facility];  Service: Orthopedics;  Laterality: Left;   TONSILLECTOMY  12/30/2004   TOTAL ABDOMINAL HYSTERECTOMY W/ BILATERAL SALPINGOOPHORECTOMY  05/16/2004   TRANSURETHRAL RESECTION OF BLADDER TUMOR N/A 10/03/2015   Procedure: TRANSURETHRAL RESECTION OF BLADDER TUMOR (TURBT);  Surgeon: Irine Seal, MD;  Location[facility];  Service: Urology;  Laterality: N/A;   TRANSURETHRAL RESECTION OF BLADDER TUMOR N/A 10/31/2015   Procedure: RE-STAGINGG TRANSURETHRAL RESECTION OF BLADDER TUMOR (TURBT);  Surgeon: Irine Seal, MD;  Location: [facility];  Service: Urology;  Laterality: N/A;   TRANSURETHRAL RESECTION OF BLADDER TUMOR N/A 08/17/2016   Procedure: TRANSURETHRAL RESECTION OF BLADDER TUMOR (TURBT);  Surgeon: Cleon Gustin, MD;  Location: [facility];  Service: Urology;  Laterality: N/A;    Social History    Socioeconomic History   Marital status: Married    Spouse name: Not on file   Number of children: Not on file   Years of education: Not on file   Highest education level: Not on file  Occupational History   Not on file  Tobacco Use   Smoking status: Former    Packs/day: 0.50    Years: 10.00    Total pack years: 5.00    Types: Cigarettes    Quit date: 01/31/2021    Years since quitting: 0.3   Smokeless tobacco: Never   Tobacco comments:    02/06/21-no smoking past 5 days  Vaping Use   Vaping Use: Never used  Substance and Sexual Activity   Alcohol use: No   Drug use: No   Sexual activity: Not Currently    Birth control/protection: Surgical    Comment: hyst  Other Topics Concern   Not on file  Social History Narrative   Not on file   Social Determinants of Health   Financial Resource Strain: Not on file  Food Insecurity: Not on file  Transportation Needs: Not on file  Physical Activity: Not on file  Stress: Not on file  Social Connections: Not on file    Family History  Problem Relation Age of Onset   Hypertension Mother    Cancer Mother    Breast cancer Maternal Aunt    Lung cancer Maternal Uncle    Cancer Other    Diabetes Daughter    Colon cancer Neg Hx     Outpatient Encounter Medications as of 06/24/2021  Medication Sig   Insulin Disposable Pump (OMNIPOD DASH INTRO, GEN 4,) KIT Change pod every 48-72 hrs   Insulin Disposable Pump (OMNIPOD DASH PODS, GEN 4,) MISC Change pod every 48-72 hrs   ACCU-CHEK GUIDE test strip USE AS INSTRUCTED TO MONITOR GLUCOSE 4 TIMES DAILY BEFORE MEALS AND BEFORE BED.   Accu-Chek Softclix Lancets lancets Use as instructed to monitor glucose 4 times daily   amLODipine (NORVASC) 5 MG tablet Take 1 tablet (5 mg total) by mouth daily.   aspirin EC 81 MG tablet Take 1 tablet (81 mg total) by mouth daily. Swallow whole.   Blood Glucose Monitoring Suppl (ACCU-CHEK GUIDE) w/Device KIT 1 Piece by Does not apply route as directed.    Blood Pressure Monitoring (BLOOD PRESSURE CUFF) MISC See admin instructions.   buPROPion (WELLBUTRIN XL) 300 MG 24 hr tablet Take 300 mg by mouth every morning.   cloNIDine HCl (KAPVAY) 0.1 MG TB12 ER tablet 1 tablet at bedtime.   Continuous Blood Gluc Sensor (DEXCOM G6 SENSOR) MISC Apply new sensor every 10 days as directed  Continuous Blood Gluc Transmit (DEXCOM G6 TRANSMITTER) MISC 1 Device by Does not apply route every 3 (three) months.   Evolocumab (REPATHA SURECLICK) 597 MG/ML SOAJ Inject 1 Dose into the skin every 14 (fourteen) days.   fenofibrate (TRICOR) 145 MG tablet Take 1 tablet (145 mg total) by mouth daily.   FLUoxetine (PROZAC) 20 MG capsule Take 20 mg by mouth daily. Takes  at bedtime   FLUoxetine (PROZAC) 40 MG capsule Take 40 mg by mouth daily.   gabapentin (NEURONTIN) 300 MG capsule TAKE 1 CAPSULE(300 MG) BY MOUTH THREE TIMES DAILY   GLOBAL EASE INJECT PEN NEEDLES 31G X 5 MM MISC Inject into the skin.   hydrocortisone (ANUSOL-HC) 2.5 % rectal cream Place 1 application rectally 2 (two) times daily.   insulin aspart (NOVOLOG) 100 UNIT/ML FlexPen Inject 10-16 Units into the skin 3 (three) times daily with meals.   insulin glargine (LANTUS) 100 UNIT/ML Solostar Pen Inject 45 Units into the skin at bedtime.   Insulin Pen Needle (PEN NEEDLES) 31G X 6 MM MISC Use to inject insulin 4 times daily   isosorbide mononitrate (IMDUR) 60 MG 24 hr tablet Take 1 tablet (60 mg total) by mouth daily.   linaclotide (LINZESS) 72 MCG capsule Take 1 capsule (72 mcg total) by mouth daily before breakfast.   metoprolol succinate (TOPROL-XL) 100 MG 24 hr tablet Take 1 tablet (100 mg total) by mouth daily. Take with or immediately following a meal.   montelukast (SINGULAIR) 10 MG tablet Take 10 mg by mouth at bedtime.   nitroGLYCERIN (NITROSTAT) 0.4 MG SL tablet Place 1 tablet (0.4 mg total) under the tongue every 5 (five) minutes as needed for chest pain.   pantoprazole (PROTONIX) 40 MG tablet Take 1  tablet (40 mg total) by mouth 2 (two) times daily before a meal.   rosuvastatin (CRESTOR) 40 MG tablet Take 1 tablet (40 mg total) by mouth daily.   tamsulosin (FLOMAX) 0.4 MG CAPS capsule Take 1 capsule (0.4 mg total) by mouth at bedtime.   VYVANSE 40 MG capsule Take 40 mg by mouth every morning.   [DISCONTINUED] Continuous Blood Gluc Sensor (DEXCOM G6 SENSOR) MISC Apply new sensor every 10 days as directed   [DISCONTINUED] Continuous Blood Gluc Transmit (DEXCOM G6 TRANSMITTER) MISC 1 Device by Does not apply route every 3 (three) months.   [DISCONTINUED] Evolocumab (REPATHA SURECLICK) 416 MG/ML SOAJ Inject 1 Dose into the skin every 14 (fourteen) days.   [DISCONTINUED] fenofibrate (TRICOR) 145 MG tablet Take 145 mg by mouth daily.   [DISCONTINUED] glipiZIDE (GLUCOTROL XL) 5 MG 24 hr tablet Take 1 tablet (5 mg total) by mouth daily with breakfast.   [DISCONTINUED] insulin glargine (LANTUS) 100 UNIT/ML Solostar Pen Inject 30 Units into the skin at bedtime.   [DISCONTINUED] metFORMIN (GLUCOPHAGE) 1000 MG tablet 2 tablet with meals   [DISCONTINUED] rosuvastatin (CRESTOR) 40 MG tablet Take 40 mg by mouth daily.   Facility-Administered Encounter Medications as of 06/24/2021  Medication   betamethasone acetate-betamethasone sodium phosphate (CELESTONE) injection 3 mg    ALLERGIES: Allergies  Allergen Reactions   Ibuprofen Hives    itching itching itching   Benazepril Hives   Diphenhydramine Hives    Other reaction(s): hives Other reaction(s): hives   Metronidazole Nausea And Vomiting    Other reaction(s): nausea Other reaction(s): nausea   Oxycodone     Pt has tolerated hydromorphone in the past and takes Norco at home.   Adhesive [Tape] Rash   Codeine Nausea And Vomiting  and Rash    Other reaction(s): hives   Loratadine Rash   Nystatin-Triamcinolone Rash   Penicillins Rash    Has patient had a PCN reaction causing immediate rash, facial/tongue/throat swelling, SOB or  lightheadedness with hypotension: No Has patient had a PCN reaction causing severe rash involving mucus membranes or skin necrosis: Yes Has patient had a PCN reaction that required hospitalization: No Has patient had a PCN reaction occurring within the last 10 years: no  If all of the above answers are "NO", then may proceed with Cephalosporin use. Other reaction(s): nausea Other reaction(s): nausea    VACCINATION STATUS: Immunization History  Administered Date(s) Administered   Influenza,inj,Quad PF,6+ Mos 11/01/2015    Diabetes She presents for her follow-up diabetic visit. She has type 2 diabetes mellitus. Onset time: She was diagnosed at approximate age of 27 years with A1c of greater than 15% Her disease course has been worsening. There are no hypoglycemic associated symptoms. Pertinent negatives for hypoglycemia include no confusion, headaches, pallor or seizures. Associated symptoms include foot paresthesias. Pertinent negatives for diabetes include no blurred vision, no chest pain, no fatigue, no polydipsia, no polyphagia and no polyuria. There are no hypoglycemic complications. Symptoms are stable. Diabetic complications include peripheral neuropathy. Risk factors for coronary artery disease include dyslipidemia, diabetes mellitus, hypertension, sedentary lifestyle and tobacco exposure. Current diabetic treatment includes intensive insulin program and oral agent (dual therapy). She is compliant with treatment most of the time. Her weight is stable. She is following a generally unhealthy diet. When asked about meal planning, she reported none. She has not had a previous visit with a dietitian. She rarely participates in exercise. Her home blood glucose trend is fluctuating minimally. Her breakfast blood glucose range is generally 140-180 mg/dl. Her lunch blood glucose range is generally 140-180 mg/dl. Her dinner blood glucose range is generally 130-140 mg/dl. Her bedtime blood glucose range  is generally 130-140 mg/dl. Her overall blood glucose range is 140-180 mg/dl. (She presents today with her CGM showing slightly above target glycemic profile overall.  Her POCT A1c today is 8.1%, increasing slightly from last visit of 7.2%.  She denies any significant hypoglycemia.  Analysis of her CGM shows TIR 40%, TAR 60%, TBR 0%.  She is interested in [place].  She notes she quit sodas about 3 weeks ago and quit smoking as well!) An ACE inhibitor/angiotensin II receptor blocker is not being taken. She does not see a podiatrist.Eye exam is current.  Hyperlipidemia This is a chronic problem. The current episode started more than 1 year ago. The problem is uncontrolled. Recent lipid tests were reviewed and are high. Exacerbating diseases include diabetes. Factors aggravating her hyperlipidemia include beta blockers and fatty foods. Pertinent negatives include no chest pain, myalgias or shortness of breath. Current antihyperlipidemic treatment includes fibric acid derivatives and statins. The current treatment provides mild improvement of lipids. Compliance problems include adherence to diet and adherence to exercise.  Risk factors for coronary artery disease include diabetes mellitus, dyslipidemia, a sedentary lifestyle, family history and hypertension.  Hypertension This is a chronic problem. The current episode started more than 1 year ago. The problem is uncontrolled. Pertinent negatives include no blurred vision, chest pain, headaches, palpitations or shortness of breath. There are no associated agents to hypertension. Risk factors for coronary artery disease include dyslipidemia, diabetes mellitus and sedentary lifestyle. Past treatments include calcium channel blockers and beta blockers. The current treatment provides mild improvement. Compliance problems include diet and exercise.  Hypertensive end-organ damage  includes CAD/MI.    Review of systems  Constitutional: + Minimally fluctuating body  weight,  current Body mass index is 35.75 kg/m. , no fatigue, no subjective hyperthermia, no subjective hypothermia Eyes: no blurry vision, no xerophthalmia ENT: no sore throat, no nodules palpated in throat, no dysphagia/odynophagia, no hoarseness Cardiovascular: no chest pain, no shortness of breath, no palpitations, no leg swelling Respiratory: no cough, no shortness of breath Gastrointestinal: no nausea/vomiting/diarrhea Musculoskeletal: no muscle/joint aches Skin: no rashes, no hyperemia Neurological: no tremors, + numbness/tingling to BLE, no dizziness Psychiatric: no depression, no anxiety   Objective:    BP (!) 143/93   Pulse 87   Ht _0  (1.499 m)   Wt 177 lb (80.3 kg)   BMI 35.75 kg/m   Wt Readings from Last 3 Encounters:  06/24/21 177 lb (80.3 kg)  06/18/21 177 lb (80.3 kg)  06/18/21 177 lb (80.3 kg)    BP Readings from Last 3 Encounters:  06/24/21 (!) 143/93  06/18/21 (!) 135/96  06/18/21 140/80     Physical Exam- Limited  Constitutional:  Body mass index is 35.75 kg/m. , not in acute distress, normal state of mind Eyes:  EOMI, no exophthalmos Neck: Supple Cardiovascular: RRR, no murmurs, rubs, or gallops, no edema Respiratory: Adequate breathing efforts, no crackles, rales, rhonchi, or wheezing Musculoskeletal: no gross deformities, strength intact in all four extremities, no gross restriction of joint movements Skin:  no rashes, no hyperemia Neurological: no tremor with outstretched hands   Diabetic Foot Exam - Simple   No data filed    CMP ( most recent) CMP     Component Value Date/Time   NA 134 (L) 01/23/2021 0026   NA 136 12/20/2020 1208   K 3.9 01/23/2021 0026   CL 101 01/23/2021 0026   CO2 25 01/23/2021 0026   GLUCOSE 91 01/23/2021 0026   BUN 16 01/23/2021 0026   BUN 10 12/20/2020 1208   CREATININE 0.84 01/23/2021 0026   CALCIUM 8.8 (L) 01/23/2021 0026   PROT 7.0 01/23/2021 0026   PROT 6.6 12/18/2020 0834   ALBUMIN 3.4 (L)  01/23/2021 0026   ALBUMIN 4.1 12/18/2020 0834   AST 18 01/23/2021 0026   ALT 19 01/23/2021 0026   ALKPHOS 97 01/23/2021 0026   BILITOT 0.1 (L) 01/23/2021 0026   BILITOT 0.2 12/18/2020 0834   GFRNONAA >60 01/23/2021 0026   GFRAA >60 04/17/2019 1427     Diabetic Labs (most recent): Lab Results  Component Value Date   HGBA1C 8.1 06/24/2021   HGBA1C 7.2 (A) 02/24/2021   HGBA1C 8.6 (H) 09/18/2020   MICROALBUR 80 06/24/2021   MICROALBUR 80 07/10/2020      Assessment & Plan:   1) Uncontrolled type 2 diabetes mellitus with hyperglycemia ([place])  - Sandra [name] has currently uncontrolled symptomatic type 2 DM since  46 years of age.  She presents today with her CGM showing slightly above target glycemic profile overall.  Her POCT A1c today is 8.1%, increasing slightly from last visit of 7.2%.  She denies any significant hypoglycemia.  Analysis of her CGM shows TIR 40%, TAR 60%, TBR 0%.  She is interested in [place].  She notes she quit sodas about 3 weeks ago and quit smoking as well!  - I had a long discussion with her about the progressive nature of diabetes and the pathology behind its complications. -her diabetes is complicated by chronic smoking and she remains at a high risk for more acute and chronic complications  which include CAD, CVA, CKD, retinopathy, and neuropathy. These are all discussed in detail with her.  - Nutritional counseling repeated at each appointment due to patients tendency to fall back in to old habits.  - The patient admits there is a room for improvement in their diet and drink choices. -  Suggestion is made for the patient to avoid simple carbohydrates from their diet including Cakes, Sweet Desserts / Pastries, Ice Cream, Soda (diet and regular), Sweet Tea, Candies, Chips, Cookies, Sweet Pastries, Store Bought Juices, Alcohol in Excess of 1-2 drinks a day, Artificial Sweeteners, Coffee Creamer, and "Sugar-free" Products. This will help patient to have  stable blood glucose profile and potentially avoid unintended weight gain.   - I encouraged the patient to switch to unprocessed or minimally processed complex starch and increased protein intake (animal or plant source), fruits, and vegetables.   - Patient is advised to stick to a routine mealtimes to eat 3 meals a day and avoid unnecessary snacks (to snack only to correct hypoglycemia).  - she will be scheduled with Jearld Fenton, RDN, CDE for diabetes education.  - I have approached her with the following individualized plan to manage  her diabetes and patient agrees:   -No changes will be made to her insulin today as we prepare to start Omnipod DASH.  She is advised to stop Metformin as it is causing significant stomach issues.  She will also stop her Glipizide once she starts using the Omnipod.   I sent this script to the local pharmacy.  -She is encouraged to continue monitoring blood glucose 4 times daily (using her CGM), before meals and at bedtime, and to call the clinic if she has readings less than 70 or greater than 300 for 3 readings in a row.   - Specific targets for  A1c;  LDL, HDL, Triglycerides, were discussed with the patient.  2) Blood Pressure /Hypertension: Her blood pressure is not controlled to target. She is allergic to ACE inhibitors. She has a history of being a chronic heavy smoker, quit in 2021.  She is advised to continue her current medications including Metoprolol 50 mg po daily and Norvasc 5 mg po daily.   3) Lipids/Hyperlipidemia:  Her most recent lipid panel from 12/18/20 shows controlled LDL of 99 and elevated triglycerides of 165.  She is advised to continue her Crestor 40 mg po daily at bedtime, Repatha 140 mg po every 14 days, and Tricor 145 mg po daily.  Side effects and precautions discussed with her.    4)  Weight/Diet:  Her Body mass index is 35.75 kg/m.-clearly complicating her diabetes care.  She is a candidate for modest weight loss.  I discussed  with her the fact that loss of 5 - 10% of her  current body weight will have the most impact on her diabetes management.  Exercise, and detailed carbohydrates information provided  -  detailed on discharge instructions.  5) Chronic Care/Health Maintenance: -she is not on ACEI/ARB and is on Statin medications and is encouraged to initiate and continue to follow up with Ophthalmology, Dentist,  Podiatrist at least yearly or according to recommendations, and advised to stay away from smoking. I have recommended yearly flu vaccine and pneumonia vaccine at least every 5 years; moderate intensity exercise for up to 150 minutes weekly; and  sleep for at least 7 hours a day.  - she is advised to maintain close follow up with Coolidge Breeze, FNP for primary care needs, as  well as her other providers for optimal and coordinated care.      I spent 49 minutes in the care of the patient today including review of labs from [place], Lipids, Thyroid Function, Hematology (current and previous including abstractions from other facilities); face-to-face time discussing  her blood glucose readings/logs, discussing hypoglycemia and hyperglycemia episodes and symptoms, medications doses, her options of short and long term treatment based on the latest standards of care / guidelines;  discussion about incorporating lifestyle medicine;  and documenting the encounter.    Please refer to Patient Instructions for Blood Glucose Monitoring and Insulin/Medications Dosing Guide"  in media tab for additional information. Please  also refer to " Patient Self Inventory" in the Media  tab for reviewed elements of pertinent patient history.  Sandra [name] participated in the discussions, expressed understanding, and voiced agreement with the above plans.  All questions were answered to her satisfaction. she is encouraged to contact clinic should she have any questions or concerns prior to her return visit.   Follow up plan: - Return  in about 3 months (around 09/24/2021) for Diabetes F/U with A1c in office, No previsit labs, Bring meter and logs.   Rayetta Pigg, [facility] [address] [place],  [address] Phone: [phone] Fax: [phone]  06/24/2021, 2:32 PM

## 2021-06-24 NOTE — Progress Notes (Signed)
Virtual Visit via Video Note   This visit type was conducted due to national recommendations for restrictions regarding the COVID-19 Pandemic (e.g. social distancing) in an effort to limit this patient's exposure and mitigate transmission in our community.  Due to her co-morbid illnesses, this patient is at least at moderate risk for complications without adequate follow up.  The patient was seen using video technology. This format is felt to be most appropriate for this patient at this time.  All issues noted in this document were discussed and addressed.  A limited physical exam was performed with this format.  Please refer to the patient's chart for her consent to telehealth for [facility].     Date:  06/24/2021   ID:  Sandra [name], DOB Jan 12, 1976, MRN [id number] The patient was identified using 2 identifiers.  Evaluation Performed:  Follow-Up Visit  Patient Location:  [place] [address]  Provider location:   [address], [place] [address], [place] [address]  PCP:  Coolidge Breeze, FNP  Cardiologist:  Jenkins Rouge, MD Electrophysiologist:  None   Chief Complaint:  Manage dyslipidemia  History of Present Illness:    Sandra [name] is a 46 y.o. female who presents via audio/video conferencing for a telehealth visit today.  This is a pleasant 46 year old female with a history of bipolar 1 and ADHD, hypertension, dyslipidemia and type 2 diabetes on insulin.  Unfortunately she recently had recurrent chest pain symptoms and underwent heart catheterization in May 2022, which demonstrated multivessel coronary disease with diffuse small vessels.  There was no clear significant focal stenosis for PCI.  Medical therapy was recommended.  She does report family history of early onset heart disease in her father.  He also had high cholesterol.  She currently is on rosuvastatin 40 mg which she has been taking for "years".  She also takes fenofibrate.  Recent lipids  demonstrated total cholesterol 202, direct LDL 146, HDL 31 and triglyceride 168.  Her LDL then without high potency statin will be well over 190 more consistent with a familial hyperlipidemia.  This would explain her very early onset heart disease in addition to her diabetes.  She reports recently her diabetes control is much better as her blood sugars had remain between 4 and 500 and now are down around 130.  12/23/2020  Sandra [name] returns today for telephone follow-up.  Overall she seems to be tolerating Repatha.  She has had less than expected improvement in her cholesterol with LDL down to 99 from 146 however there is improvement.  She reports compliance with her statin and fenofibrate.  She is injecting in her abdomen and seems to be following the correct instructions.  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  06/24/2021  Sandra [name] returns today for follow-up via video visit.  She reports she just had recent repeat labs in [place] however we cannot find that information.  It was performed through [place].  She is compliant with Repatha.  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  Generally these should improve however with marked lipid reduction.  If this continues to be an issue, she may wish to seek out an oculoplastics person.  The patient does not have symptoms concerning for COVID-19 infection (fever, chills, cough, or new SHORTNESS OF BREATH).    Prior CV studies:   The following studies were reviewed today:  Chart reviewed, lab  work  PMHx:  Past Medical History:  Diagnosis Date   ADHD (attention deficit hyperactivity disorder)    Allergic rhinitis    Anemia    hx of   Barrett's esophagus    Bipolar 1 disorder ([place])    Bladder tumor    CAD (coronary artery disease)    Cancer ([place])    bladder   Constipation    Dilated cardiomyopathy ([place]) 12/20/2020   Echo 6/22 ([facility]): EF  43, mild MR, mild TR // Echo 3/22: EF 50-55   Dizziness    Full dentures    GERD (gastroesophageal reflux disease)    Gross hematuria    Hyperlipidemia    Hypertension    Stable angina (H[facility])    Type 2 diabetes mellitus ([place])    Urgency of urination    dysuria, sui   Wears glasses     Past Surgical History:  Procedure Laterality Date   BALLOON DILATION N/A 02/17/2021   Procedure: BALLOON DILATION;  Surgeon: Eloise Harman, DO;  [facility];  Service: Endoscopy;  Laterality: N/A;   BIOPSY  02/17/2021   Procedure: BIOPSY;  Surgeon: Eloise Harman, DO;  [facility];  Service: Endoscopy;;   COLONOSCOPY WITH PROPOFOL N/A 02/17/2021   Surgeon: Eloise Harman, DO; nonbleeding internal hemorrhoids, otherwise normal exam.  Repeat in 10 years.   CYSTOSCOPY N/A 10/03/2015   Procedure: CYSTOSCOPY;  Surgeon: Irine Seal, MD;  Location: [facility];  Service: Urology;  Laterality: N/A;   CYSTOSCOPY WITH STENT PLACEMENT Right 10/31/2015   Procedure: CYSTOSCOPY WITH ATTEMPTED RIGHT URETERAL OPENING;  Surgeon: Irine Seal, MD;  Location: [facility];  Service: Urology;  Laterality: Right;   ESOPHAGOGASTRODUODENOSCOPY (EGD) WITH PROPOFOL N/A 02/17/2021   Surgeon: Eloise Harman, DO; 2 cm hiatal hernia, Barrett's esophagus, gastritis with biopsies negative for H. pylori.  S/p empiric esophageal dilation.  Repeat in 3 years.   FOOT SURGERY Left    September 2022, broke 7 bones in left foot   INTERSTIM IMPLANT PLACEMENT N/A 03/14/2018   Procedure: Barrie Lyme IMPLANT FIRST STAGE;  Surgeon: Cleon Gustin, MD;  [facility];  Service: Urology;  Laterality: N/A;   INTERSTIM IMPLANT PLACEMENT N/A 03/30/2018   Procedure: Barrie Lyme IMPLANT SECOND STAGE;  Surgeon: Cleon Gustin, MD;  [facility];  Service: Urology;  Laterality: N/A;   LEFT HEART CATH AND CORONARY ANGIOGRAPHY N/A 05/31/2020   Procedure: LEFT HEART CATH AND  CORONARY ANGIOGRAPHY;  Surgeon: Troy Sine, MD;  Location: [place] [facility];  Service: Cardiovascular;  Laterality: N/A;   MULTIPLE TOOTH EXTRACTIONS  2015   OVARIAN CYST REMOVAL Right 2004 approx   SHOULDER OPEN ROTATOR CUFF REPAIR Left 04/20/2019   Procedure: ROTATOR CUFF REPAIR SHOULDER OPEN;  Surgeon: Carole Civil, MD;  [facility];  Service: Orthopedics;  Laterality: Left;   TONSILLECTOMY  12/30/2004   TOTAL ABDOMINAL HYSTERECTOMY W/ BILATERAL SALPINGOOPHORECTOMY  05/16/2004   TRANSURETHRAL RESECTION OF BLADDER TUMOR N/A 10/03/2015   Procedure: TRANSURETHRAL RESECTION OF BLADDER TUMOR (TURBT);  Surgeon: Irine Seal, MD;  Location: [facility];  Service: Urology;  Laterality: N/A;   TRANSURETHRAL RESECTION OF BLADDER TUMOR N/A 10/31/2015   Procedure: RE-STAGINGG TRANSURETHRAL RESECTION OF BLADDER TUMOR (TURBT);  Surgeon: Irine Seal, MD;  Location: [facility];  Service: Urology;  Laterality: N/A;   TRANSURETHRAL RESECTION OF BLADDER TUMOR N/A 08/17/2016   Procedure: TRANSURETHRAL RESECTION OF BLADDER TUMOR (TURBT);  Surgeon:  McKenzie, Candee Furbish, MD;  [facility];  Service: Urology;  Laterality: N/A;    FAMHx:  Family History  Problem Relation Age of Onset   Hypertension Mother    Cancer Mother    Breast cancer Maternal Aunt    Lung cancer Maternal Uncle    Cancer Other    Diabetes Daughter    Colon cancer Neg Hx     SOCHx:   reports that she quit smoking about 4 months ago. Her smoking use included cigarettes. She has a 5.00 pack-year smoking history. She has never used smokeless tobacco. She reports that she does not drink alcohol and does not use drugs.  ALLERGIES:  Allergies  Allergen Reactions   Ibuprofen Hives    itching itching itching   Benazepril Hives   Diphenhydramine Hives    Other reaction(s): hives Other reaction(s): hives   Metronidazole Nausea And Vomiting    Other reaction(s): nausea Other reaction(s):  nausea   Oxycodone     Pt has tolerated hydromorphone in the past and takes Norco at home.   Adhesive [Tape] Rash   Codeine Nausea And Vomiting and Rash    Other reaction(s): hives   Loratadine Rash   Nystatin-Triamcinolone Rash   Penicillins Rash    Has patient had a PCN reaction causing immediate rash, facial/tongue/throat swelling, SOB or lightheadedness with hypotension: No Has patient had a PCN reaction causing severe rash involving mucus membranes or skin necrosis: Yes Has patient had a PCN reaction that required hospitalization: No Has patient had a PCN reaction occurring within the last 10 years: no  If all of the above answers are "NO", then may proceed with Cephalosporin use. Other reaction(s): nausea Other reaction(s): nausea    MEDS:  Current Meds  Medication Sig   ACCU-CHEK GUIDE test strip USE AS INSTRUCTED TO MONITOR GLUCOSE 4 TIMES DAILY BEFORE MEALS AND BEFORE BED.   Accu-Chek Softclix Lancets lancets Use as instructed to monitor glucose 4 times daily   aspirin EC 81 MG tablet Take 1 tablet (81 mg total) by mouth daily. Swallow whole.   Blood Glucose Monitoring Suppl (ACCU-CHEK GUIDE) w/Device KIT 1 Piece by Does not apply route as directed.   Blood Pressure Monitoring (BLOOD PRESSURE CUFF) MISC See admin instructions.   buPROPion (WELLBUTRIN XL) 300 MG 24 hr tablet Take 300 mg by mouth every morning.   cloNIDine HCl (KAPVAY) 0.1 MG TB12 ER tablet 1 tablet at bedtime.   Continuous Blood Gluc Sensor (DEXCOM G6 SENSOR) MISC Apply new sensor every 10 days as directed   Continuous Blood Gluc Transmit (DEXCOM G6 TRANSMITTER) MISC 1 Device by Does not apply route every 3 (three) months.   Evolocumab (REPATHA SURECLICK) 599 MG/ML SOAJ Inject 1 Dose into the skin every 14 (fourteen) days.   fenofibrate (TRICOR) 145 MG tablet Take 145 mg by mouth daily.   FLUoxetine (PROZAC) 20 MG capsule Take 20 mg by mouth daily. Takes  at bedtime   FLUoxetine (PROZAC) 40 MG capsule Take  40 mg by mouth daily.   gabapentin (NEURONTIN) 300 MG capsule TAKE 1 CAPSULE(300 MG) BY MOUTH THREE TIMES DAILY   glipiZIDE (GLUCOTROL XL) 5 MG 24 hr tablet Take 1 tablet (5 mg total) by mouth daily with breakfast.   GLOBAL EASE INJECT PEN NEEDLES 31G X 5 MM MISC Inject into the skin.   hydrocortisone (ANUSOL-HC) 2.5 % rectal cream Place 1 application rectally 2 (two) times daily.   insulin aspart (NOVOLOG) 100 UNIT/ML FlexPen Inject 10-16 Units  into the skin 3 (three) times daily with meals.   insulin glargine (LANTUS) 100 UNIT/ML Solostar Pen Inject 30 Units into the skin at bedtime.   Insulin Pen Needle (PEN NEEDLES) 31G X 6 MM MISC Use to inject insulin 4 times daily   linaclotide (LINZESS) 72 MCG capsule Take 1 capsule (72 mcg total) by mouth daily before breakfast.   metFORMIN (GLUCOPHAGE) 1000 MG tablet 2 tablet with meals   metoprolol succinate (TOPROL-XL) 100 MG 24 hr tablet Take 1 tablet (100 mg total) by mouth daily. Take with or immediately following a meal.   montelukast (SINGULAIR) 10 MG tablet Take 10 mg by mouth at bedtime.   pantoprazole (PROTONIX) 40 MG tablet Take 1 tablet (40 mg total) by mouth 2 (two) times daily before a meal.   rosuvastatin (CRESTOR) 40 MG tablet Take 40 mg by mouth daily.   tamsulosin (FLOMAX) 0.4 MG CAPS capsule Take 1 capsule (0.4 mg total) by mouth at bedtime.   VYVANSE 40 MG capsule Take 40 mg by mouth every morning.   Current Facility-Administered Medications for the 06/24/21 encounter (Appointment) with Pixie Casino, MD  Medication   betamethasone acetate-betamethasone sodium phosphate (CELESTONE) injection 3 mg     ROS: Pertinent items noted in HPI and remainder of comprehensive ROS otherwise negative.  Labs/Other Tests and Data Reviewed:    Recent Labs: 01/23/2021: ALT 19; BUN 16; Creatinine, Ser 0.84; Hemoglobin 12.3; Platelets 425; Potassium 3.9; Sodium 134   Recent Lipid Panel Lab Results  Component Value Date/Time   CHOL 246 (H)  06/05/2020 08:53 AM   TRIG 273 (H) 06/05/2020 08:53 AM   HDL 31 (L) 06/05/2020 08:53 AM   CHOLHDL 7.9 (H) 06/05/2020 08:53 AM   LDLCALC 163 (H) 06/05/2020 08:53 AM    Wt Readings from Last 3 Encounters:  06/18/21 177 lb (80.3 kg)  06/18/21 177 lb (80.3 kg)  02/24/21 172 lb (78 kg)     Exam:    Vital Signs:  There were no vitals taken for this visit.   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  She has some discomfort with her xanthelasmas.  She just had recent repeat labs however for some reason they have not resulted through [facility].  We will try to track them down but if we cannot locate them she will need repeat labs, probably in conjunction with her visit to see Dr. Johnsie Cancel back in August.  COVID-19 Education: The signs and symptoms of COVID-19 were discussed with the patient and how to seek care for testing (follow up with PCP or arrange E-visit).  The importance of social distancing was discussed today.  Patient Risk:   After full review of this patients clinical status, I feel that they are at least moderate risk at this time.  Time:   Today, I have spent 15 minutes with the patient with telehealth technology discussing dyslipidemia.     Medication Adjustments/Labs and Tests Ordered: Current medicines are reviewed at length with the patient today.  Concerns regarding medicines are outlined above.   Tests Ordered: No orders of the defined types were placed in this encounter.  Medication Changes: No orders  of the defined types were placed in this encounter.    Disposition:  in 1 year(s)  Pixie Casino, MD, [facility], [place] Director of the Advanced Lipid Disorders &  Cardiovascular Risk Reduction Clinic Diplomate of the American Board of Clinical Lipidology Attending Cardiologist  Direct Dial: [phone]  Fax: [phone]  Website:  [email or web address]  Pixie Casino, MD  06/24/2021 9:15 AM

## 2021-06-24 NOTE — Telephone Encounter (Signed)
Left message for patient to call back  

## 2021-06-30 ENCOUNTER — Ambulatory Visit (HOSPITAL_COMMUNITY): Payer: Medicaid Other

## 2021-07-02 ENCOUNTER — Ambulatory Visit: Payer: Medicaid Other | Admitting: Physician Assistant

## 2021-07-02 ENCOUNTER — Telehealth: Payer: Self-pay | Admitting: Nurse Practitioner

## 2021-07-02 NOTE — Telephone Encounter (Signed)
Informed patient

## 2021-07-02 NOTE — Telephone Encounter (Signed)
Pt called and said she has her insulin pump now and was told to call us and set up training. Who is to reach out to her?

## 2021-07-02 NOTE — Telephone Encounter (Signed)
She opens up the starter kit and fills out the registration and then [employer] will contact her to start the training process.

## 2021-07-04 ENCOUNTER — Ambulatory Visit: Payer: Medicaid Other | Admitting: Physician Assistant

## 2021-07-07 ENCOUNTER — Other Ambulatory Visit: Payer: Self-pay | Admitting: Nurse Practitioner

## 2021-07-07 MED ORDER — OMNIPOD DASH INTRO (GEN 4) KIT: PACK | 0 refills | Status: DC

## 2021-07-08 ENCOUNTER — Ambulatory Visit: Payer: Medicaid Other | Admitting: Physician Assistant

## 2021-07-17 ENCOUNTER — Encounter (HOSPITAL_COMMUNITY): Payer: Self-pay

## 2021-07-17 ENCOUNTER — Emergency Department (HOSPITAL_COMMUNITY)
Admission: EM | Admit: 2021-07-17 | Discharge: 2021-07-17 | Discharge status: Against Medical Advice | Payer: Medicaid Other | Source: Home / Self Care

## 2021-07-17 ENCOUNTER — Other Ambulatory Visit: Payer: Self-pay

## 2021-07-17 DIAGNOSIS — R1031 Right lower quadrant pain: Secondary | ICD-10-CM | POA: Insufficient documentation

## 2021-07-17 DIAGNOSIS — N132 Hydronephrosis with renal and ureteral calculous obstruction: Secondary | ICD-10-CM | POA: Insufficient documentation

## 2021-07-17 DIAGNOSIS — Z5321 Procedure and treatment not carried out due to patient leaving prior to being seen by health care provider: Secondary | ICD-10-CM | POA: Insufficient documentation

## 2021-07-17 DIAGNOSIS — R112 Nausea with vomiting, unspecified: Secondary | ICD-10-CM | POA: Insufficient documentation

## 2021-07-17 LAB — COMPREHENSIVE METABOLIC PANEL
ALT: 25 U/L (ref 0–44)
AST: 22 U/L (ref 15–41)
Albumin: 4 g/dL (ref 3.5–5.0)
Alkaline Phosphatase: 125 U/L (ref 38–126)
Anion gap: 9 (ref 5–15)
BUN: 16 mg/dL (ref 6–20)
CO2: 24 mmol/L (ref 22–32)
Calcium: 9.5 mg/dL (ref 8.9–10.3)
Chloride: 103 mmol/L (ref 98–111)
Creatinine, Ser: 0.9 mg/dL (ref 0.44–1.00)
GFR, Estimated: >60 mL/min (ref >60)
Glucose, Bld: 155 mg/dL — ABNORMAL HIGH (ref 70–99)
Potassium: 4.2 mmol/L (ref 3.5–5.1)
Sodium: 136 mmol/L (ref 135–145)
Total Bilirubin: 0.5 mg/dL (ref 0.3–1.2)
Total Protein: 8.1 g/dL (ref 6.5–8.1)

## 2021-07-17 LAB — CBC
HCT: 43.7 % (ref 36.0–46.0)
Hemoglobin: 13.8 g/dL (ref 12.0–15.0)
MCH: 25.3 pg — ABNORMAL LOW (ref 26.0–34.0)
MCHC: 31.6 g/dL (ref 30.0–36.0)
MCV: 80.2 fL (ref 80.0–100.0)
Platelets: 414 10*3/uL — ABNORMAL HIGH (ref 150–400)
RBC: 5.45 MIL/uL — ABNORMAL HIGH (ref 3.87–5.11)
RDW: 14.4 % (ref 11.5–15.5)
WBC: 15.7 10*3/uL — ABNORMAL HIGH (ref 4.0–10.5)
nRBC: 0 % (ref 0.0–0.2)

## 2021-07-17 LAB — LIPASE, BLOOD: Lipase: 24 U/L (ref 11–51)

## 2021-07-17 NOTE — ED Triage Notes (Signed)
Pt c/o RLQ abd pain with N/V. Pt states she was seen in the ED at [facility]. 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 Pt has hx of bladder CA. Pt states she is supposed to follow up with urology in the AM, but pt has uncontrolled pain. Pt was Rx Lortab '5mg'$ .

## 2021-07-17 NOTE — ED Triage Notes (Signed)
Pt called x3 with no response  

## 2021-07-18 ENCOUNTER — Encounter (HOSPITAL_COMMUNITY): Payer: Self-pay | Admitting: Emergency Medicine

## 2021-07-18 ENCOUNTER — Inpatient Hospital Stay (HOSPITAL_COMMUNITY)
Admission: EM | Admit: 2021-07-18 | Discharge: 2021-07-24 | DRG: 660 | Disposition: A | Discharge status: Home / Self Care | Payer: Medicaid Other | Attending: Family Medicine | Admitting: Family Medicine

## 2021-07-18 ENCOUNTER — Other Ambulatory Visit: Payer: Self-pay

## 2021-07-18 ENCOUNTER — Ambulatory Visit (INDEPENDENT_AMBULATORY_CARE_PROVIDER_SITE_OTHER): Payer: Medicaid Other | Admitting: Physician Assistant

## 2021-07-18 ENCOUNTER — Emergency Department (HOSPITAL_COMMUNITY): Payer: Medicaid Other

## 2021-07-18 VITALS — BP 133/82 | HR 68 | Temp 98.2°F | Ht 60.0 in | Wt 170.0 lb

## 2021-07-18 DIAGNOSIS — Z960 Presence of urogenital implants: Secondary | ICD-10-CM | POA: Diagnosis present

## 2021-07-18 DIAGNOSIS — Z6834 Body mass index (BMI) 34.0-34.9, adult: Secondary | ICD-10-CM

## 2021-07-18 DIAGNOSIS — Z8551 Personal history of malignant neoplasm of bladder: Secondary | ICD-10-CM

## 2021-07-18 DIAGNOSIS — I2510 Atherosclerotic heart disease of native coronary artery without angina pectoris: Secondary | ICD-10-CM | POA: Diagnosis present

## 2021-07-18 DIAGNOSIS — R112 Nausea with vomiting, unspecified: Secondary | ICD-10-CM

## 2021-07-18 DIAGNOSIS — R197 Diarrhea, unspecified: Secondary | ICD-10-CM | POA: Diagnosis not present

## 2021-07-18 DIAGNOSIS — N131 Hydronephrosis with ureteral stricture, not elsewhere classified: Secondary | ICD-10-CM | POA: Diagnosis present

## 2021-07-18 DIAGNOSIS — K581 Irritable bowel syndrome with constipation: Secondary | ICD-10-CM | POA: Diagnosis present

## 2021-07-18 DIAGNOSIS — I10 Essential (primary) hypertension: Secondary | ICD-10-CM | POA: Diagnosis present

## 2021-07-18 DIAGNOSIS — Z79899 Other long term (current) drug therapy: Secondary | ICD-10-CM | POA: Diagnosis not present

## 2021-07-18 DIAGNOSIS — E1165 Type 2 diabetes mellitus with hyperglycemia: Secondary | ICD-10-CM | POA: Diagnosis present

## 2021-07-18 DIAGNOSIS — Z888 Allergy status to other drugs, medicaments and biological substances status: Secondary | ICD-10-CM | POA: Diagnosis not present

## 2021-07-18 DIAGNOSIS — R1031 Right lower quadrant pain: Secondary | ICD-10-CM

## 2021-07-18 DIAGNOSIS — Z880 Allergy status to penicillin: Secondary | ICD-10-CM

## 2021-07-18 DIAGNOSIS — R296 Repeated falls: Secondary | ICD-10-CM | POA: Diagnosis present

## 2021-07-18 DIAGNOSIS — K219 Gastro-esophageal reflux disease without esophagitis: Secondary | ICD-10-CM | POA: Diagnosis present

## 2021-07-18 DIAGNOSIS — I420 Dilated cardiomyopathy: Secondary | ICD-10-CM | POA: Diagnosis present

## 2021-07-18 DIAGNOSIS — J309 Allergic rhinitis, unspecified: Secondary | ICD-10-CM | POA: Diagnosis present

## 2021-07-18 DIAGNOSIS — E669 Obesity, unspecified: Secondary | ICD-10-CM | POA: Diagnosis present

## 2021-07-18 DIAGNOSIS — N134 Hydroureter: Secondary | ICD-10-CM | POA: Diagnosis not present

## 2021-07-18 DIAGNOSIS — Z716 Tobacco abuse counseling: Secondary | ICD-10-CM | POA: Diagnosis not present

## 2021-07-18 DIAGNOSIS — Z7982 Long term (current) use of aspirin: Secondary | ICD-10-CM | POA: Diagnosis not present

## 2021-07-18 DIAGNOSIS — F319 Bipolar disorder, unspecified: Secondary | ICD-10-CM | POA: Diagnosis present

## 2021-07-18 DIAGNOSIS — Z794 Long term (current) use of insulin: Secondary | ICD-10-CM | POA: Diagnosis not present

## 2021-07-18 DIAGNOSIS — I25119 Atherosclerotic heart disease of native coronary artery with unspecified angina pectoris: Secondary | ICD-10-CM | POA: Diagnosis not present

## 2021-07-18 DIAGNOSIS — N1330 Unspecified hydronephrosis: Secondary | ICD-10-CM | POA: Diagnosis not present

## 2021-07-18 DIAGNOSIS — F909 Attention-deficit hyperactivity disorder, unspecified type: Secondary | ICD-10-CM | POA: Diagnosis present

## 2021-07-18 DIAGNOSIS — Z886 Allergy status to analgesic agent status: Secondary | ICD-10-CM

## 2021-07-18 DIAGNOSIS — R935 Abnormal findings on diagnostic imaging of other abdominal regions, including retroperitoneum: Secondary | ICD-10-CM

## 2021-07-18 DIAGNOSIS — Z885 Allergy status to narcotic agent status: Secondary | ICD-10-CM

## 2021-07-18 DIAGNOSIS — N135 Crossing vessel and stricture of ureter without hydronephrosis: Secondary | ICD-10-CM | POA: Diagnosis not present

## 2021-07-18 DIAGNOSIS — E7801 Familial hypercholesterolemia: Secondary | ICD-10-CM | POA: Diagnosis present

## 2021-07-18 DIAGNOSIS — Z91048 Other nonmedicinal substance allergy status: Secondary | ICD-10-CM

## 2021-07-18 DIAGNOSIS — R1030 Lower abdominal pain, unspecified: Secondary | ICD-10-CM | POA: Diagnosis not present

## 2021-07-18 DIAGNOSIS — Z90722 Acquired absence of ovaries, bilateral: Secondary | ICD-10-CM

## 2021-07-18 DIAGNOSIS — K5900 Constipation, unspecified: Secondary | ICD-10-CM

## 2021-07-18 DIAGNOSIS — Z9079 Acquired absence of other genital organ(s): Secondary | ICD-10-CM

## 2021-07-18 DIAGNOSIS — R109 Unspecified abdominal pain: Secondary | ICD-10-CM

## 2021-07-18 DIAGNOSIS — Z90710 Acquired absence of both cervix and uterus: Secondary | ICD-10-CM

## 2021-07-18 DIAGNOSIS — Z87891 Personal history of nicotine dependence: Secondary | ICD-10-CM | POA: Diagnosis not present

## 2021-07-18 LAB — COMPREHENSIVE METABOLIC PANEL
ALT: 24 U/L (ref 0–44)
AST: 23 U/L (ref 15–41)
Albumin: 3.8 g/dL (ref 3.5–5.0)
Alkaline Phosphatase: 123 U/L (ref 38–126)
Anion gap: 9 (ref 5–15)
BUN: 17 mg/dL (ref 6–20)
CO2: 25 mmol/L (ref 22–32)
Calcium: 9.2 mg/dL (ref 8.9–10.3)
Chloride: 102 mmol/L (ref 98–111)
Creatinine, Ser: 0.86 mg/dL (ref 0.44–1.00)
GFR, Estimated: >60 mL/min (ref >60)
Glucose, Bld: 152 mg/dL — ABNORMAL HIGH (ref 70–99)
Potassium: 4 mmol/L (ref 3.5–5.1)
Sodium: 136 mmol/L (ref 135–145)
Total Bilirubin: 0.6 mg/dL (ref 0.3–1.2)
Total Protein: 7.9 g/dL (ref 6.5–8.1)

## 2021-07-18 LAB — LIPASE, BLOOD: Lipase: 27 U/L (ref 11–51)

## 2021-07-18 LAB — URINALYSIS, ROUTINE W REFLEX MICROSCOPIC
Bacteria, UA: NONE SEEN
Bilirubin Urine: NEGATIVE
Glucose, UA: NEGATIVE mg/dL
Hgb urine dipstick: NEGATIVE
Ketones, ur: NEGATIVE mg/dL
Nitrite: NEGATIVE
Protein, ur: 30 mg/dL — AB
Specific Gravity, Urine: 1.02 (ref 1.005–1.030)
pH: 5 (ref 5.0–8.0)

## 2021-07-18 LAB — CBC WITH DIFFERENTIAL/PLATELET
Abs Immature Granulocytes: 0.02 10*3/uL (ref 0.00–0.07)
Basophils Absolute: 0 10*3/uL (ref 0.0–0.1)
Basophils Relative: 0 %
Eosinophils Absolute: 0.1 10*3/uL (ref 0.0–0.5)
Eosinophils Relative: 1 %
HCT: 42.2 % (ref 36.0–46.0)
Hemoglobin: 13.4 g/dL (ref 12.0–15.0)
Immature Granulocytes: 0 %
Lymphocytes Relative: 37 %
Lymphs Abs: 3.3 10*3/uL (ref 0.7–4.0)
MCH: 25.7 pg — ABNORMAL LOW (ref 26.0–34.0)
MCHC: 31.8 g/dL (ref 30.0–36.0)
MCV: 81 fL (ref 80.0–100.0)
Monocytes Absolute: 0.7 10*3/uL (ref 0.1–1.0)
Monocytes Relative: 8 %
Neutro Abs: 4.8 10*3/uL (ref 1.7–7.7)
Neutrophils Relative %: 54 %
Platelets: 371 10*3/uL (ref 150–400)
RBC: 5.21 MIL/uL — ABNORMAL HIGH (ref 3.87–5.11)
RDW: 14.5 % (ref 11.5–15.5)
WBC: 9 10*3/uL (ref 4.0–10.5)
nRBC: 0 % (ref 0.0–0.2)

## 2021-07-18 LAB — CBG MONITORING, ED
Glucose-Capillary: 231 mg/dL — ABNORMAL HIGH (ref 70–99)
Glucose-Capillary: 232 mg/dL — ABNORMAL HIGH (ref 70–99)

## 2021-07-18 MED ORDER — SODIUM CHLORIDE 0.9 % IV SOLN
25.0000 mg | Freq: Four times a day (QID) | INTRAVENOUS | Status: DC | PRN
  Administered 2021-07-18 – 2021-07-23 (×14): 25 mg via INTRAVENOUS
  Filled 2021-07-18 (×8): qty 1

## 2021-07-18 MED ORDER — SODIUM CHLORIDE 0.9 % IV SOLN: 12.5000 mg | Freq: Four times a day (QID) | INTRAVENOUS | Status: DC | PRN

## 2021-07-18 MED ORDER — SODIUM CHLORIDE 0.9 % IV BOLUS
1000.0000 mL | Freq: Once | INTRAVENOUS | Status: AC
  Administered 2021-07-18: 1000 mL via INTRAVENOUS

## 2021-07-18 MED ORDER — HYDROMORPHONE HCL 1 MG/ML IJ SOLN
0.5000 mg | Freq: Once | INTRAMUSCULAR | Status: AC
  Administered 2021-07-18: 0.5 mg via INTRAVENOUS
  Filled 2021-07-18: qty 0.5

## 2021-07-18 MED ORDER — INSULIN ASPART 100 UNIT/ML IJ SOLN
0.0000 [IU] | Freq: Every day | INTRAMUSCULAR | Status: DC
  Administered 2021-07-18: 2 [IU] via SUBCUTANEOUS
  Administered 2021-07-20 – 2021-07-23 (×2): 3 [IU] via SUBCUTANEOUS
  Filled 2021-07-18: qty 1

## 2021-07-18 MED ORDER — INSULIN ASPART 100 UNIT/ML IJ SOLN
0.0000 [IU] | Freq: Three times a day (TID) | INTRAMUSCULAR | Status: DC
  Administered 2021-07-19 – 2021-07-21 (×7): 1 [IU] via SUBCUTANEOUS
  Administered 2021-07-21: 2 [IU] via SUBCUTANEOUS
  Administered 2021-07-22: 3 [IU] via SUBCUTANEOUS
  Administered 2021-07-22 (×2): 2 [IU] via SUBCUTANEOUS
  Administered 2021-07-23: 3 [IU] via SUBCUTANEOUS
  Administered 2021-07-23 (×2): 2 [IU] via SUBCUTANEOUS
  Administered 2021-07-24: 5 [IU] via SUBCUTANEOUS

## 2021-07-18 MED ORDER — FENTANYL CITRATE PF 50 MCG/ML IJ SOSY
50.0000 ug | PREFILLED_SYRINGE | Freq: Once | INTRAMUSCULAR | Status: AC
  Administered 2021-07-18: 50 ug via INTRAVENOUS
  Filled 2021-07-18: qty 1

## 2021-07-18 MED ORDER — SODIUM CHLORIDE 0.9 % IV SOLN
25.0000 mg | Freq: Four times a day (QID) | INTRAVENOUS | Status: DC | PRN
  Filled 2021-07-18: qty 1

## 2021-07-18 MED ORDER — HYDROMORPHONE HCL 1 MG/ML IJ SOLN
0.5000 mg | INTRAMUSCULAR | Status: DC | PRN
  Administered 2021-07-19 – 2021-07-21 (×11): 0.5 mg via INTRAVENOUS
  Filled 2021-07-18 (×12): qty 0.5

## 2021-07-18 MED ORDER — SODIUM CHLORIDE 0.9 % IV SOLN: INTRAVENOUS | Status: DC

## 2021-07-18 MED ORDER — IOHEXOL 300 MG/ML  SOLN
100.0000 mL | Freq: Once | INTRAMUSCULAR | Status: AC | PRN
  Administered 2021-07-18: 100 mL via INTRAVENOUS

## 2021-07-18 NOTE — Progress Notes (Signed)
Assessment: There are no diagnoses linked to this encounter.   Plan: ***  Chief Complaint: No chief complaint on file.   HPI: Sandra [name] is a 46 y.o. female who presents for continued evaluation of ***.   Portions of the above documentation were copied from a prior visit for review purposes only.  Allergies: Allergies  Allergen Reactions   Ibuprofen Hives    itching itching itching   Benazepril Hives   Diphenhydramine Hives    Other reaction(s): hives Other reaction(s): hives   Metronidazole Nausea And Vomiting    Other reaction(s): nausea Other reaction(s): nausea   Oxycodone     Pt has tolerated hydromorphone in the past and takes Norco at home.   Adhesive [Tape] Rash   Codeine Nausea And Vomiting and Rash    Other reaction(s): hives   Loratadine Rash   Nystatin-Triamcinolone Rash   Penicillins Rash    Has patient had a PCN reaction causing immediate rash, facial/tongue/throat swelling, SOB or lightheadedness with hypotension: No Has patient had a PCN reaction causing severe rash involving mucus membranes or skin necrosis: Yes Has patient had a PCN reaction that required hospitalization: No Has patient had a PCN reaction occurring within the last 10 years: no  If all of the above answers are "NO", then may proceed with Cephalosporin use. Other reaction(s): nausea Other reaction(s): nausea    PMH: Past Medical History:  Diagnosis Date   ADHD (attention deficit hyperactivity disorder)    Allergic rhinitis    Anemia    hx of   Barrett's esophagus    Bipolar 1 disorder ([facility])    Bladder tumor    CAD (coronary artery disease)    Cancer ([place])    bladder   Constipation    Dilated cardiomyopathy ([place]) 12/20/2020   Echo 6/22 ([facility]): EF 43, mild MR, mild TR // Echo 3/22: EF 50-55   Dizziness    Full dentures    GERD (gastroesophageal reflux disease)    Gross hematuria    Hyperlipidemia    Hypertension    Stable angina ([facility])    Type 2 diabetes  mellitus ([place])    Urgency of urination    dysuria, sui   Wears glasses     PSH: Past Surgical History:  Procedure Laterality Date   BALLOON DILATION N/A 02/17/2021   Procedure: BALLOON DILATION;  Surgeon: Eloise Harman, DO;  [facility];  Service: Endoscopy;  Laterality: N/A;   BIOPSY  02/17/2021   Procedure: BIOPSY;  Surgeon: Eloise Harman, DO;  [facility];  Service: Endoscopy;;   COLONOSCOPY WITH PROPOFOL N/A 02/17/2021   Surgeon: Eloise Harman, DO; nonbleeding internal hemorrhoids, otherwise normal exam.  Repeat in 10 years.   CYSTOSCOPY N/A 10/03/2015   Procedure: CYSTOSCOPY;  Surgeon: Irine Seal, MD;  Location: [facility];  Service: Urology;  Laterality: N/A;   CYSTOSCOPY WITH STENT PLACEMENT Right 10/31/2015   Procedure: CYSTOSCOPY WITH ATTEMPTED RIGHT URETERAL OPENING;  Surgeon: Irine Seal, MD;  Location: [facility];  Service: Urology;  Laterality: Right;   ESOPHAGOGASTRODUODENOSCOPY (EGD) WITH PROPOFOL N/A 02/17/2021   Surgeon: Eloise Harman, DO; 2 cm hiatal hernia, Barrett's esophagus, gastritis with biopsies negative for H. pylori.  S/p empiric esophageal dilation.  Repeat in 3 years.   FOOT SURGERY Left    September 2022, broke 7 bones in left foot   INTERSTIM IMPLANT PLACEMENT N/A 03/14/2018   Procedure: INTERSTIM IMPLANT FIRST STAGE;  Surgeon:  McKenzie, Candee Furbish, MD;  [facility];  Service: Urology;  Laterality: N/A;   INTERSTIM IMPLANT PLACEMENT N/A 03/30/2018   Procedure: Barrie Lyme IMPLANT SECOND STAGE;  Surgeon: Cleon Gustin, MD;  [facility];  Service: Urology;  Laterality: N/A;   LEFT HEART CATH AND CORONARY ANGIOGRAPHY N/A 05/31/2020   Procedure: LEFT HEART CATH AND CORONARY ANGIOGRAPHY;  Surgeon: Troy Sine, MD;  Location: [place] [facility];  Service: Cardiovascular;  Laterality: N/A;   MULTIPLE TOOTH EXTRACTIONS  2015   OVARIAN CYST REMOVAL Right 2004 approx   SHOULDER  OPEN ROTATOR CUFF REPAIR Left 04/20/2019   Procedure: ROTATOR CUFF REPAIR SHOULDER OPEN;  Surgeon: Carole Civil, MD;  [facility];  Service: Orthopedics;  Laterality: Left;   TONSILLECTOMY  12/30/2004   TOTAL ABDOMINAL HYSTERECTOMY W/ BILATERAL SALPINGOOPHORECTOMY  05/16/2004   TRANSURETHRAL RESECTION OF BLADDER TUMOR N/A 10/03/2015   Procedure: TRANSURETHRAL RESECTION OF BLADDER TUMOR (TURBT);  Surgeon: Irine Seal, MD;  Location: [facility];  Service: Urology;  Laterality: N/A;   TRANSURETHRAL RESECTION OF BLADDER TUMOR N/A 10/31/2015   Procedure: RE-STAGINGG TRANSURETHRAL RESECTION OF BLADDER TUMOR (TURBT);  Surgeon: Irine Seal, MD;  Location: [facility];  Service: Urology;  Laterality: N/A;   TRANSURETHRAL RESECTION OF BLADDER TUMOR N/A 08/17/2016   Procedure: TRANSURETHRAL RESECTION OF BLADDER TUMOR (TURBT);  Surgeon: Cleon Gustin, MD;  [facility];  Service: Urology;  Laterality: N/A;    SH: Social History   Tobacco Use   Smoking status: Former    Packs/day: 0.50    Years: 10.00    Total pack years: 5.00    Types: Cigarettes    Quit date: 01/31/2021    Years since quitting: 0.4   Smokeless tobacco: Never   Tobacco comments:    02/06/21-no smoking past 5 days  Vaping Use   Vaping Use: Never used  Substance Use Topics   Alcohol use: No   Drug use: No    ROS: All other review of systems were reviewed and are negative except what is noted above in HPI  PE: BP 133/82   Pulse 68   Ht 5' (1.524 m)   Wt 170 lb (77.1 kg)   BMI 33.20 kg/m  GENERAL APPEARANCE:  Well appearing, well developed, well nourished, NAD HEENT:  Atraumatic, normocephalic NECK:  Supple. Trachea midline ABDOMEN:  Soft, non-tender, no masses EXTREMITIES:  Moves all extremities well, without clubbing, cyanosis, or edema NEUROLOGIC:  Alert and oriented x 3, normal gait, CN II-XII grossly intact MENTAL STATUS:  appropriate BACK:  Non-tender to  palpation, No CVAT SKIN:  Warm, dry, and intact   Results: Laboratory Data: Lab Results  Component Value Date   WBC 15.7 (H) 07/17/2021   HGB 13.8 07/17/2021   HCT 43.7 07/17/2021   MCV 80.2 07/17/2021   PLT 414 (H) 07/17/2021    Lab Results  Component Value Date   CREATININE 0.90 07/17/2021    Lab Results  Component Value Date   HGBA1C 8.1 06/24/2021    Urinalysis    Component Value Date/Time   COLORURINE STRAW (A) 04/08/2020 1800   APPEARANCEUR Clear 06/18/2021 1125   LABSPEC 1.028 04/08/2020 1800   PHURINE 5.0 04/08/2020 1800   GLUCOSEU Trace (A) 06/18/2021 1125   HGBUR NEGATIVE 04/08/2020 1800   BILIRUBINUR Negative 06/18/2021 1125   KETONESUR NEGATIVE 04/08/2020 1800   PROTEINUR Negative 06/18/2021 1125   PROTEINUR NEGATIVE 04/08/2020 1800   UROBILINOGEN 0.2 03/01/2011 1920  NITRITE Negative 06/18/2021 1125   NITRITE NEGATIVE 04/08/2020 1800   LEUKOCYTESUR Trace (A) 06/18/2021 1125   LEUKOCYTESUR NEGATIVE 04/08/2020 1800    Lab Results  Component Value Date   LABMICR See below: 06/18/2021   WBCUA 0-5 06/18/2021   LABEPIT 0-10 06/18/2021   MUCUS Present 06/18/2021   BACTERIA Few 06/18/2021    Pertinent Imaging: No results found for this or any previous visit.  No results found for this or any previous visit.  No results found for this or any previous visit.  No results found for this or any previous visit.  Results for orders placed during the hospital encounter of 01/08/16  US Renal  Narrative CLINICAL DATA:  Malignant tumor of trigone of bladder. Cystoscopy and trans urethral resection of bladder tumor 10/03/2015 and 10/31/2015.  EXAM: RENAL / URINARY TRACT ULTRASOUND COMPLETE  COMPARISON:  None.  FINDINGS: Right Kidney:  Length: 12.0 cm, within normal limits. Echogenicity within normal limits. No mass or hydronephrosis visualized.  Left Kidney:  Length: 12.4 cm, within normal limits. Echogenicity within normal limits. No  mass or hydronephrosis visualized.  Bladder:  The bladder is not well distended.  No discrete lesions are evident.  IMPRESSION: Negative bilateral renal ultrasound.   Electronically Signed By: San Morelle M.D. On: 01/08/2016 12:27  No results found for this or any previous visit.  No results found for this or any previous visit.  No results found for this or any previous visit.  Results for orders placed or performed during the hospital encounter of 07/17/21 (from the past 24 hour(s))  Lipase, blood   Collection Time: 07/17/21  8:19 PM  Result Value Ref Range   Lipase 24 11 - 51 U/L  Comprehensive metabolic panel   Collection Time: 07/17/21  8:19 PM  Result Value Ref Range   Sodium 136 135 - 145 mmol/L   Potassium 4.2 3.5 - 5.1 mmol/L   Chloride 103 98 - 111 mmol/L   CO2 24 22 - 32 mmol/L   Glucose, Bld 155 (H) 70 - 99 mg/dL   BUN 16 6 - 20 mg/dL   Creatinine, Ser 0.90 0.44 - 1.00 mg/dL   Calcium 9.5 8.9 - 10.3 mg/dL   Total Protein 8.1 6.5 - 8.1 g/dL   Albumin 4.0 3.5 - 5.0 g/dL   AST 22 15 - 41 U/L   ALT 25 0 - 44 U/L   Alkaline Phosphatase 125 38 - 126 U/L   Total Bilirubin 0.5 0.3 - 1.2 mg/dL   GFR, Estimated >60 >60 mL/min   Anion gap 9 5 - 15  CBC   Collection Time: 07/17/21  8:19 PM  Result Value Ref Range   WBC 15.7 (H) 4.0 - 10.5 K/uL   RBC 5.45 (H) 3.87 - 5.11 MIL/uL   Hemoglobin 13.8 12.0 - 15.0 g/dL   HCT 43.7 36.0 - 46.0 %   MCV 80.2 80.0 - 100.0 fL   MCH 25.3 (L) 26.0 - 34.0 pg   MCHC 31.6 30.0 - 36.0 g/dL   RDW 14.4 11.5 - 15.5 %   Platelets 414 (H) 150 - 400 K/uL   nRBC 0.0 0.0 - 0.2 %

## 2021-07-18 NOTE — H&P (Addendum)
History and Physical    Patient: Sandra [name] XBM:[id number] DOB: 05-22-1975 DOA: 07/18/2021 DOS: the patient was seen and examined on 07/18/2021 PCP: Coolidge Breeze, FNP  Patient coming from: Home  Chief Complaint:  Chief Complaint  Patient presents with   Emesis   HPI: Sandra [name] is a 46 y.o. female with medical history significant of hypertension, dilated cardiomyopathy, bipolar disorder, CAD, bladder cancer (in remission) who presents to the emergency department due to 1 day onset of nausea and vomiting.  Patient states that she woke up around 2 AM on Thursday (7/6) to use the bathroom after this, she complained of right lower quadrant pain, which was associated with nausea and vomiting, patient states that she has not been able to keep anything down since onset of symptoms, she tried Zofran at home, but due to being unable to keep anything down, she vomited the medication.  She also complained of an associated suprapubic cramping.  She denies any burning sensation on urination or any other irritative bladder symptoms.  Patient denies fever, chills, chest pain.,  She states that she went to [facility] with CT scan without contrast was done and it showed right-sided moderate hydronephrosis with no obvious stone seen, but there was questionable evidence of malignancy.  He presented to the ED here at [facility], but she left prior to being seen due to long wait time.  Patient states that her current symptoms are similar to when she was for diagnosis of bladder cancer and she also complained of recurrent falls and imbalance since onset of symptoms.   ED Course:  In the emergency department, he was hemodynamically stable.  Work-up in ED showed normal CBC and BMP except for hyperglycemia 27, urinalysis was normal. CT abdomen and pelvis without contrast showed a moderate right hydroureteronephrosis, no radiosensitive stone seen, but the obstruction is at the level of the right ureteral vesicle  junction, underlying bladder versus ureteral malignancy is possible. CT abdomen and pelvis with contrast showed mild right-sided hydroureteronephrosis to the level of the bladder with right perinephric fat stranding. No obstructing calculi identified. Findings may be related to recently passed calculus (nonvisualized), infection involving the right kidney and ureter, or distal ureteral obstructive stricture or lesion (less likely). Urologist (Dr. Alinda Money) was consulted and will consult with patient in the morning per ED PA.  Patient was treated with IV fentanyl and Dilaudid, IV hydration was provided.  Hospitalist was asked to admit patient for further evaluation and management.  Review of Systems: Review of systems as noted in the HPI. All other systems reviewed and are negative.   Past Medical History:  Diagnosis Date   ADHD (attention deficit hyperactivity disorder)    Allergic rhinitis    Anemia    hx of   Barrett's esophagus    Bipolar 1 disorder (HCC)    Bladder tumor    CAD (coronary artery disease)    Cancer ([place])    bladder   Constipation    Dilated cardiomyopathy ([place]) 12/20/2020   Echo 6/22 ([facility]): EF 43, mild MR, mild TR // Echo 3/22: EF 50-55   Dizziness    Full dentures    GERD (gastroesophageal reflux disease)    Gross hematuria    Hyperlipidemia    Hypertension    Stable angina (HCC)    Type 2 diabetes mellitus ([place])    Urgency of urination    dysuria, sui   Wears glasses    Past Surgical History:  Procedure Laterality  Date   BALLOON DILATION N/A 02/17/2021   Procedure: BALLOON DILATION;  Surgeon: Eloise Harman, DO;  Location: [facility];  Service: Endoscopy;  Laterality: N/A;   BIOPSY  02/17/2021   Procedure: BIOPSY;  Surgeon: Eloise Harman, DO;  Location: [facility];  Service: Endoscopy;;   COLONOSCOPY WITH PROPOFOL N/A 02/17/2021   Surgeon: Eloise Harman, DO; nonbleeding internal hemorrhoids, otherwise normal exam.  Repeat in 10 years.    CYSTOSCOPY N/A 10/03/2015   Procedure: CYSTOSCOPY;  Surgeon: Irine Seal, MD;  Location[facility];  Service: Urology;  Laterality: N/A;   CYSTOSCOPY WITH STENT PLACEMENT Right 10/31/2015   Procedure: CYSTOSCOPY WITH ATTEMPTED RIGHT URETERAL OPENING;  Surgeon: Irine Seal, MD;  Location: [facility];  Service: Urology;  Laterality: Right;   ESOPHAGOGASTRODUODENOSCOPY (EGD) WITH PROPOFOL N/A 02/17/2021   Surgeon: Eloise Harman, DO; 2 cm hiatal hernia, Barrett's esophagus, gastritis with biopsies negative for H. pylori.  S/p empiric esophageal dilation.  Repeat in 3 years.   FOOT SURGERY Left    September 2022, broke 7 bones in left foot   INTERSTIM IMPLANT PLACEMENT N/A 03/14/2018   Procedure: Barrie Lyme IMPLANT FIRST STAGE;  Surgeon: Cleon Gustin, MD;  Location: [facility];  Service: Urology;  Laterality: N/A;   INTERSTIM IMPLANT PLACEMENT N/A 03/30/2018   Procedure: Barrie Lyme IMPLANT SECOND STAGE;  Surgeon: Cleon Gustin, MD;  Location: [facility];  Service: Urology;  Laterality: N/A;   LEFT HEART CATH AND CORONARY ANGIOGRAPHY N/A 05/31/2020   Procedure: LEFT HEART CATH AND CORONARY ANGIOGRAPHY;  Surgeon: Troy Sine, MD;  Location: [facility];  Service: Cardiovascular;  Laterality: N/A;   MULTIPLE TOOTH EXTRACTIONS  2015   OVARIAN CYST REMOVAL Right 2004 approx   SHOULDER OPEN ROTATOR CUFF REPAIR Left 04/20/2019   Procedure: ROTATOR CUFF REPAIR SHOULDER OPEN;  Surgeon: Carole Civil, MD;  Location: [facility];  Service: Orthopedics;  Laterality: Left;   TONSILLECTOMY  12/30/2004   TOTAL ABDOMINAL HYSTERECTOMY W/ BILATERAL SALPINGOOPHORECTOMY  05/16/2004   TRANSURETHRAL RESECTION OF BLADDER TUMOR N/A 10/03/2015   Procedure: TRANSURETHRAL RESECTION OF BLADDER TUMOR (TURBT);  Surgeon: Irine Seal, MD;  Location[facility];  Service: Urology;  Laterality: N/A;   TRANSURETHRAL RESECTION OF BLADDER TUMOR N/A 10/31/2015    Procedure: RE-STAGINGG TRANSURETHRAL RESECTION OF BLADDER TUMOR (TURBT);  Surgeon: Irine Seal, MD;  [facility];  Service: Urology;  Laterality: N/A;   TRANSURETHRAL RESECTION OF BLADDER TUMOR N/A 08/17/2016   Procedure: TRANSURETHRAL RESECTION OF BLADDER TUMOR (TURBT);  Surgeon: Cleon Gustin, MD;  Location: [facility];  Service: Urology;  Laterality: N/A;    Social History:  reports that she quit smoking about 5 months ago. Her smoking use included cigarettes. She has a 5.00 pack-year smoking history. She has never used smokeless tobacco. She reports that she does not drink alcohol and does not use drugs.   Allergies  Allergen Reactions   Ibuprofen Hives    itching itching itching   Benazepril Hives   Diphenhydramine Hives    Other reaction(s): hives Other reaction(s): hives   Metronidazole Nausea And Vomiting    Other reaction(s): nausea Other reaction(s): nausea   Oxycodone     Pt has tolerated hydromorphone in the past and takes Norco at home.   Adhesive [Tape] Rash   Codeine Nausea And Vomiting and Rash    Other reaction(s): hives   Loratadine Rash   Nystatin-Triamcinolone Rash  Penicillins Rash    Has patient had a PCN reaction causing immediate rash, facial/tongue/throat swelling, SOB or lightheadedness with hypotension: No Has patient had a PCN reaction causing severe rash involving mucus membranes or skin necrosis: Yes Has patient had a PCN reaction that required hospitalization: No Has patient had a PCN reaction occurring within the last 10 years: no  If all of the above answers are "NO", then may proceed with Cephalosporin use. Other reaction(s): nausea Other reaction(s): nausea    Family History  Problem Relation Age of Onset   Hypertension Mother    Cancer Mother    Breast cancer Maternal Aunt    Lung cancer Maternal Uncle    Cancer Other    Diabetes Daughter    Colon cancer Neg Hx       Prior to Admission medications    Medication Sig Start Date End Date Taking? Authorizing Provider  albuterol (PROAIR HFA) 108 (90 Base) MCG/ACT inhaler Inhale 2 puffs into the lungs every 6 (six) hours as needed for wheezing or shortness of breath.   Yes [provider]  aspirin EC 81 MG tablet Take 1 tablet (81 mg total) by mouth daily. Swallow whole. 12/20/20  Yes Weaver, Scott T, PA-C  buPROPion (WELLBUTRIN XL) 300 MG 24 hr tablet Take 300 mg by mouth every morning. 01/18/20  Yes [provider]  cloNIDine HCl (KAPVAY) 0.1 MG TB12 ER tablet 1 tablet at bedtime.   Yes [provider]  doxycycline (VIBRA-TABS) 100 MG tablet Take 100 mg by mouth daily. 07/17/21  Yes [provider]  Evolocumab (REPATHA SURECLICK) 696 MG/ML SOAJ Inject 1 Dose into the skin every 14 (fourteen) days. 06/24/21  Yes Hilty, Nadean Corwin, MD  fenofibrate (TRICOR) 145 MG tablet Take 1 tablet (145 mg total) by mouth daily. 06/24/21  Yes Hilty, Nadean Corwin, MD  FLUoxetine (PROZAC) 20 MG capsule Take 20 mg by mouth daily. Takes  at bedtime   Yes [provider]  FLUoxetine (PROZAC) 40 MG capsule Take 40 mg by mouth daily. 04/02/20  Yes [provider]  gabapentin (NEURONTIN) 300 MG capsule TAKE 1 CAPSULE(300 MG) BY MOUTH THREE TIMES DAILY Patient taking differently: Take 300 mg by mouth 3 (three) times daily. 08/08/19  Yes Edrick Kins, DPM  HYDROcodone-acetaminophen (NORCO/VICODIN) 5-325 MG tablet Take 1 tablet by mouth every 6 (six) hours as needed. 07/17/21  Yes [provider]  hydrocortisone (ANUSOL-HC) 2.5 % rectal cream Place 1 application rectally 2 (two) times daily. 02/06/21  Yes Aliene Altes S, PA-C  insulin aspart (NOVOLOG) 100 UNIT/ML FlexPen Inject 10-16 Units into the skin 3 (three) times daily with meals. Patient taking differently: Inject 10-16 Units into the skin 3 (three) times daily with meals. Sliding scale 02/24/21  Yes Reardon, Juanetta Beets, NP  insulin glargine (LANTUS) 100 UNIT/ML  Solostar Pen Inject 45 Units into the skin at bedtime. Patient taking differently: Inject 30 Units into the skin at bedtime. 06/24/21  Yes Brita Romp, NP  linaclotide [facility]) 72 MCG capsule Take 1 capsule (72 mcg total) by mouth daily before breakfast. 06/18/21  Yes Erenest Rasher, PA-C  nitroGLYCERIN (NITROSTAT) 0.4 MG SL tablet Place 1 tablet (0.4 mg total) under the tongue every 5 (five) minutes as needed for chest pain. 12/20/20 07/18/21 Yes Weaver, Scott T, PA-C  pantoprazole (PROTONIX) 40 MG tablet Take 1 tablet (40 mg total) by mouth 2 (two) times daily before a meal. 06/18/21  Yes Aliene Altes S, PA-C  rosuvastatin (  CRESTOR) 40 MG tablet Take 1 tablet (40 mg total) by mouth daily. 06/24/21  Yes Hilty, Nadean Corwin, MD  tamsulosin (FLOMAX) 0.4 MG CAPS capsule Take 1 capsule (0.4 mg total) by mouth at bedtime. 06/18/21  Yes McKenzie, Candee Furbish, MD  VYVANSE 40 MG capsule Take 40 mg by mouth every morning. 12/20/19  Yes [provider]  ACCU-CHEK GUIDE test strip USE AS INSTRUCTED TO MONITOR GLUCOSE 4 TIMES DAILY BEFORE MEALS AND BEFORE BED. 04/29/21   Brita Romp, NP  Accu-Chek Softclix Lancets lancets Use as instructed to monitor glucose 4 times daily 07/10/20   Brita Romp, NP  Blood Glucose Monitoring Suppl (ACCU-CHEK GUIDE) w/Device KIT 1 Piece by Does not apply route as directed. 04/15/20   Brita Romp, NP  Blood Pressure Monitoring (BLOOD PRESSURE CUFF) MISC See admin instructions.    [provider]  Continuous Blood Gluc Sensor (DEXCOM G6 SENSOR) MISC Apply new sensor every 10 days as directed 06/24/21   Brita Romp, NP  Continuous Blood Gluc Transmit (DEXCOM G6 TRANSMITTER) MISC 1 Device by Does not apply route every 3 (three) months. 06/24/21   Brita Romp, NP  GLOBAL EASE INJECT PEN NEEDLES 31G X 5 MM MISC Inject into the skin. 03/13/21   [provider]  Insulin Pen Needle (PEN NEEDLES) 31G X 6 MM MISC Use to inject insulin 4  times daily 02/24/21   Brita Romp, NP    Physical Exam: BP (!) 138/98   Pulse 74   Temp 98.2 F (36.8 C) (Oral)   Resp 18   Ht '4\' 11"'  (1.499 m)   Wt 77.1 kg   SpO2 98%   BMI 34.34 kg/m   General: 46 y.o. year-old female ill appearing, but in no acute distress.  Alert and oriented x3. HEENT: NCAT, EOMI, xanthelasma bilaterally in the supra palpebral area Neck: Supple, trachea medial Cardiovascular: Regular rate and rhythm with no rubs or gallops.  No thyromegaly or JVD noted.  No lower extremity edema. 2/4 pulses in all 4 extremities. Respiratory: Clear to auscultation with no wheezes or rales. Good inspiratory effort. Abdomen: Soft, tender to palpation of RLQ and suprapubic region without guarding.  Normal bowel sounds x4 quadrants. Muskuloskeletal: No cyanosis, clubbing or edema noted bilaterally Neuro: CN II-XII intact, strength 5/5 x 4, sensation, reflexes intact Skin: No ulcerative lesions noted or rashes Psychiatry: Judgement and insight appear normal. Mood is appropriate for condition and setting          Labs on Admission:  Basic Metabolic Panel: Recent Labs  Lab 07/17/21 2019 07/18/21 1317  NA 136 136  K 4.2 4.0  CL 103 102  CO2 24 25  GLUCOSE 155* 152*  BUN 16 17  CREATININE 0.90 0.86  CALCIUM 9.5 9.2   Liver Function Tests: Recent Labs  Lab 07/17/21 2019 07/18/21 1317  AST 22 23  ALT 25 24  ALKPHOS 125 123  BILITOT 0.5 0.6  PROT 8.1 7.9  ALBUMIN 4.0 3.8   Recent Labs  Lab 07/17/21 2019 07/18/21 1317  LIPASE 24 27   No results for input(s): "AMMONIA" in the last 168 hours. CBC: Recent Labs  Lab 07/17/21 2019 07/18/21 1317  WBC 15.7* 9.0  NEUTROABS  --  4.8  HGB 13.8 13.4  HCT 43.7 42.2  MCV 80.2 81.0  PLT 414* 371   Cardiac Enzymes: No results for input(s): "CKTOTAL", "CKMB", "CKMBINDEX", "TROPONINI" in the last 168 hours.  BNP (last 3 results) No  results for input(s): "BNP" in the last 8760 hours.  ProBNP (last 3  results) No results for input(s): "PROBNP" in the last 8760 hours.  CBG: Recent Labs  Lab 07/18/21 1141  GLUCAP 231*    Radiological Exams on Admission: CT ABDOMEN PELVIS W CONTRAST  Result Date: 07/18/2021 CLINICAL DATA:  Right lower quadrant abdominal pain. EXAM: CT ABDOMEN AND PELVIS WITH CONTRAST TECHNIQUE: Multidetector CT imaging of the abdomen and pelvis was performed using the standard protocol following bolus administration of intravenous contrast. RADIATION DOSE REDUCTION: This exam was performed according to the departmental dose-optimization program which includes automated exposure control, adjustment of the mA and/or kV according to patient size and/or use of iterative reconstruction technique. CONTRAST:  133m OMNIPAQUE IOHEXOL 300 MG/ML  SOLN COMPARISON:  CT abdomen and pelvis 02/24/2018 FINDINGS: Lower chest: No acute abnormality. Hepatobiliary: No focal liver abnormality is seen. No gallstones, gallbladder wall thickening, or biliary dilatation. Pancreas: Unremarkable. No pancreatic ductal dilatation or surrounding inflammatory changes. Spleen: Normal in size without focal abnormality. Adrenals/Urinary Tract: There is mild right-sided hydroureteronephrosis the level of the bladder. No urinary tract calculi are seen. There is mild right perinephric fat stranding. Left kidney, adrenal glands, and bladder are within normal limits. Stomach/Bowel: Small hiatal hernia is present. Stomach is otherwise within normal limits. Appendix appears normal. No evidence of bowel wall thickening, distention, or inflammatory changes. Vascular/Lymphatic: Aortic atherosclerosis. No enlarged abdominal or pelvic lymph nodes. Reproductive: Status post hysterectomy. No adnexal masses. Other: Small fat containing umbilical hernia. No ascites or free air. Musculoskeletal: No acute or significant osseous findings. Left-sided sacral stimulator device present. IMPRESSION: 1. Mild right-sided hydroureteronephrosis to  the level of the bladder with right perinephric fat stranding. No obstructing calculi identified. Findings may be related to recently passed calculus (nonvisualized), infection involving the right kidney and ureter, or distal ureteral obstructive stricture or lesion (less likely). Recommend clinical correlation and follow-up. 2. The appendix is within normal limits. 3. Small hiatal hernia. Electronically Signed   By: ARonney AstersM.D.   On: 07/18/2021 17:29    EKG: I independently viewed the EKG done and my findings are as followed: EKG was not done in the ED  Assessment/Plan Present on Admission:  Intractable nausea and vomiting  Gastroesophageal reflux disease  CAD (coronary artery disease)  Constipation  Familial hypercholesterolemia  Principal Problem:   Intractable nausea and vomiting Active Problems:   Type 2 diabetes mellitus with hyperglycemia (HCC)   Gastroesophageal reflux disease   CAD (coronary artery disease)   Familial hypercholesterolemia   Constipation   Abdominal pain   Hydroureteronephrosis   Recurrent falls   Obesity (BMI 30-39.9)   Intractable nausea and vomiting Abdominal pain Continue IV NS at 100 mLs/Hr Continue IV Dilaudid 0.5 mg q.4h p.r.n. for moderate to severe pain Continue IV Zofran p.r.n. Continue clear liquid diet with plan to advanced diet as tolerated  Type 2 diabetes mellitus with hyperglycemia Continue ISS and hypoglycemic protocol  Familial hypercholesterolemia Continue Repatha, Crestor, Tricor  GERD Continue Protonix  Constipation Continue Linzess  CAD Continue aspirin, nitroglycerin and Crestor  Recurrent falls and imbalance Patient states that she has felt imbalance with falls without injury since onset of symptoms Continue fall precaution and neurochecks Consult PT/OT eval and treat  Right-sided hydroureteronephrosis by CT abdomen and pelvis Urologist was consulted by ED PA and will follow-up with patient in the  morning  Questionable bladder versus ureteral malignancy per CT abdomen and pelvis Urologist was consulted by ED PA and will  follow-up with patient in the morning  Obesity class 1 (BMI 34.34 kg/m) Continue diet and lifestyle modification  DVT prophylaxis: Lovenox  Code Status: Full code  Consults: Urology  Family Communication: None at bedside  Severity of Illness: The appropriate patient status for this patient is INPATIENT. Inpatient status is judged to be reasonable and necessary in order to provide the required intensity of service to ensure the patient's safety. The patient's presenting symptoms, physical exam findings, and initial radiographic and laboratory data in the context of their chronic comorbidities is felt to place them at high risk for further clinical deterioration. Furthermore, it is not anticipated that the patient will be medically stable for discharge from the hospital within 2 midnights of admission.   * I certify that at the point of admission it is my clinical judgment that the patient will require inpatient hospital care spanning beyond 2 midnights from the point of admission due to high intensity of service, high risk for further deterioration and high frequency of surveillance required.*  Author: Bernadette Hoit, DO 07/18/2021 10:20 PM  For on call review [email or web address].

## 2021-07-18 NOTE — ED Provider Notes (Signed)
[facility] EMERGENCY DEPARTMENT Provider Note   CSN: [id number] Arrival date & time: 07/18/21  1106     History Chief Complaint  Patient presents with   Emesis    Sandra [name] is a 46 y.o. female with history of dilated cardiomyopathy, bipolar disorder, CAD, hypertension, bladder cancer who presents to the emergency department with a 1 day history of intractable nausea and vomiting.  Patient states this feels similar to when she was diagnosed with bladder cancer although at that time she was having hematuria.  Patient denies hematuria today.  She is unable to tolerate any p.o. foods or fluids.  Nausea and vomiting unchanged after Zofran.  She reports associated abdominal cramping in the suprapubic region and epigastric region.  Denies any other urinary complaints, fever, chills, cough, sore throat, congestion.  Patient was seen at [facility] the night where she was having intractable vomiting.  She had a CT scan without contrast done which she brought the paper with her which showed right-sided moderate hydronephrosis with no obvious stone seen.  There was questionable evidence of malignancy.  Patient came to the [facility] emergency department yesterday but left before being seen due to wait time.   Emesis      Home Medications Prior to Admission medications   Medication Sig Start Date End Date Taking? Authorizing Provider  albuterol (PROAIR HFA) 108 (90 Base) MCG/ACT inhaler Inhale 2 puffs into the lungs every 6 (six) hours as needed for wheezing or shortness of breath.   Yes [provider]  aspirin EC 81 MG tablet Take 1 tablet (81 mg total) by mouth daily. Swallow whole. 12/20/20  Yes Weaver, Scott T, PA-C  buPROPion (WELLBUTRIN XL) 300 MG 24 hr tablet Take 300 mg by mouth every morning. 01/18/20  Yes [provider]  cloNIDine HCl (KAPVAY) 0.1 MG TB12 ER tablet 1 tablet at bedtime.   Yes [provider]  doxycycline (VIBRA-TABS) 100 MG tablet Take 100 mg  by mouth daily. 07/17/21  Yes [provider]  Evolocumab (REPATHA SURECLICK) 761 MG/ML SOAJ Inject 1 Dose into the skin every 14 (fourteen) days. 06/24/21  Yes Hilty, Nadean Corwin, MD  fenofibrate (TRICOR) 145 MG tablet Take 1 tablet (145 mg total) by mouth daily. 06/24/21  Yes Hilty, Nadean Corwin, MD  FLUoxetine (PROZAC) 20 MG capsule Take 20 mg by mouth daily. Takes  at bedtime   Yes [provider]  FLUoxetine (PROZAC) 40 MG capsule Take 40 mg by mouth daily. 04/02/20  Yes [provider]  gabapentin (NEURONTIN) 300 MG capsule TAKE 1 CAPSULE(300 MG) BY MOUTH THREE TIMES DAILY Patient taking differently: Take 300 mg by mouth 3 (three) times daily. 08/08/19  Yes Edrick Kins, DPM  HYDROcodone-acetaminophen (NORCO/VICODIN) 5-325 MG tablet Take 1 tablet by mouth every 6 (six) hours as needed. 07/17/21  Yes [provider]  hydrocortisone (ANUSOL-HC) 2.5 % rectal cream Place 1 application rectally 2 (two) times daily. 02/06/21  Yes Aliene Altes S, PA-C  insulin aspart (NOVOLOG) 100 UNIT/ML FlexPen Inject 10-16 Units into the skin 3 (three) times daily with meals. Patient taking differently: Inject 10-16 Units into the skin 3 (three) times daily with meals. Sliding scale 02/24/21  Yes Reardon, Juanetta Beets, NP  insulin glargine (LANTUS) 100 UNIT/ML Solostar Pen Inject 45 Units into the skin at bedtime. Patient taking differently: Inject 30 Units into the skin at bedtime. 06/24/21  Yes Brita Romp, NP  linaclotide (LINZESS) 72 MCG capsule Take 1 capsule (72 mcg  total) by mouth daily before breakfast. 06/18/21  Yes Jodi Mourning, Tivis Ringer, PA-C  nitroGLYCERIN (NITROSTAT) 0.4 MG SL tablet Place 1 tablet (0.4 mg total) under the tongue every 5 (five) minutes as needed for chest pain. 12/20/20 07/18/21 Yes Weaver, Scott T, PA-C  pantoprazole (PROTONIX) 40 MG tablet Take 1 tablet (40 mg total) by mouth 2 (two) times daily before a meal. 06/18/21  Yes Jodi Mourning, Kristen S, PA-C  rosuvastatin  (CRESTOR) 40 MG tablet Take 1 tablet (40 mg total) by mouth daily. 06/24/21  Yes Hilty, Nadean Corwin, MD  tamsulosin (FLOMAX) 0.4 MG CAPS capsule Take 1 capsule (0.4 mg total) by mouth at bedtime. 06/18/21  Yes McKenzie, Candee Furbish, MD  VYVANSE 40 MG capsule Take 40 mg by mouth every morning. 12/20/19  Yes [provider]  ACCU-CHEK GUIDE test strip USE AS INSTRUCTED TO MONITOR GLUCOSE 4 TIMES DAILY BEFORE MEALS AND BEFORE BED. 04/29/21   Brita Romp, NP  Accu-Chek Softclix Lancets lancets Use as instructed to monitor glucose 4 times daily 07/10/20   Brita Romp, NP  Blood Glucose Monitoring Suppl (ACCU-CHEK GUIDE) w/Device KIT 1 Piece by Does not apply route as directed. 04/15/20   Brita Romp, NP  Blood Pressure Monitoring (BLOOD PRESSURE CUFF) MISC See admin instructions.    [provider]  Continuous Blood Gluc Sensor (DEXCOM G6 SENSOR) MISC Apply new sensor every 10 days as directed 06/24/21   Brita Romp, NP  Continuous Blood Gluc Transmit (DEXCOM G6 TRANSMITTER) MISC 1 Device by Does not apply route every 3 (three) months. 06/24/21   Brita Romp, NP  GLOBAL EASE INJECT PEN NEEDLES 31G X 5 MM MISC Inject into the skin. 03/13/21   [provider]  Insulin Pen Needle (PEN NEEDLES) 31G X 6 MM MISC Use to inject insulin 4 times daily 02/24/21   Brita Romp, NP      Allergies    Ibuprofen, Benazepril, Diphenhydramine, Metronidazole, Oxycodone, Adhesive [tape], Codeine, Loratadine, Nystatin-triamcinolone, and Penicillins    Review of Systems   Review of Systems  Gastrointestinal:  Positive for vomiting.  All other systems reviewed and are negative.   Physical Exam Updated Vital Signs BP 134/80   Pulse 72   Temp 98.3 F (36.8 C) (Oral)   Resp (!) 24   Ht '4\' 11"'  (1.499 m)   Wt 77.1 kg   SpO2 100%   BMI 34.34 kg/m  Physical Exam Vitals and nursing note reviewed.  Constitutional:      General: She is not in acute distress.     Appearance: Normal appearance.  HENT:     Head: Normocephalic and atraumatic.  Eyes:     General:        Right eye: No discharge.        Left eye: No discharge.  Cardiovascular:     Comments: Regular rate and rhythm.  S1/S2 are distinct without any evidence of murmur, rubs, or gallops.  Radial pulses are 2+ bilaterally.  Dorsalis pedis pulses are 2+ bilaterally.  No evidence of pedal edema. Pulmonary:     Comments: Clear to auscultation bilaterally.  Normal effort.  No respiratory distress.  No evidence of wheezes, rales, or rhonchi heard throughout. Abdominal:     General: Abdomen is flat. Bowel sounds are normal. There is no distension.     Tenderness: There is abdominal tenderness in the epigastric area and suprapubic area. There is no guarding or rebound.  Musculoskeletal:  General: Normal range of motion.     Cervical back: Neck supple.  Skin:    General: Skin is warm and dry.     Findings: No rash.  Neurological:     General: No focal deficit present.     Mental Status: She is alert.  Psychiatric:        Mood and Affect: Mood normal.        Behavior: Behavior normal.     ED Results / Procedures / Treatments   Labs (all labs ordered are listed, but only abnormal results are displayed) Labs Reviewed  CBC WITH DIFFERENTIAL/PLATELET - Abnormal; Notable for the following components:      Result Value   RBC 5.21 (*)    MCH 25.7 (*)    All other components within normal limits  COMPREHENSIVE METABOLIC PANEL - Abnormal; Notable for the following components:   Glucose, Bld 152 (*)    All other components within normal limits  URINALYSIS, ROUTINE W REFLEX MICROSCOPIC - Abnormal; Notable for the following components:   APPearance HAZY (*)    Protein, ur 30 (*)    Leukocytes,Ua SMALL (*)    All other components within normal limits  CBG MONITORING, ED - Abnormal; Notable for the following components:   Glucose-Capillary 231 (*)    All other components within normal  limits  LIPASE, BLOOD    EKG None  Radiology CT ABDOMEN PELVIS W CONTRAST  Result Date: 07/18/2021 CLINICAL DATA:  Right lower quadrant abdominal pain. EXAM: CT ABDOMEN AND PELVIS WITH CONTRAST TECHNIQUE: Multidetector CT imaging of the abdomen and pelvis was performed using the standard protocol following bolus administration of intravenous contrast. RADIATION DOSE REDUCTION: This exam was performed according to the departmental dose-optimization program which includes automated exposure control, adjustment of the mA and/or kV according to patient size and/or use of iterative reconstruction technique. CONTRAST:  122m OMNIPAQUE IOHEXOL 300 MG/ML  SOLN COMPARISON:  CT abdomen and pelvis 02/24/2018 FINDINGS: Lower chest: No acute abnormality. Hepatobiliary: No focal liver abnormality is seen. No gallstones, gallbladder wall thickening, or biliary dilatation. Pancreas: Unremarkable. No pancreatic ductal dilatation or surrounding inflammatory changes. Spleen: Normal in size without focal abnormality. Adrenals/Urinary Tract: There is mild right-sided hydroureteronephrosis the level of the bladder. No urinary tract calculi are seen. There is mild right perinephric fat stranding. Left kidney, adrenal glands, and bladder are within normal limits. Stomach/Bowel: Small hiatal hernia is present. Stomach is otherwise within normal limits. Appendix appears normal. No evidence of bowel wall thickening, distention, or inflammatory changes. Vascular/Lymphatic: Aortic atherosclerosis. No enlarged abdominal or pelvic lymph nodes. Reproductive: Status post hysterectomy. No adnexal masses. Other: Small fat containing umbilical hernia. No ascites or free air. Musculoskeletal: No acute or significant osseous findings. Left-sided sacral stimulator device present. IMPRESSION: 1. Mild right-sided hydroureteronephrosis to the level of the bladder with right perinephric fat stranding. No obstructing calculi identified. Findings may  be related to recently passed calculus (nonvisualized), infection involving the right kidney and ureter, or distal ureteral obstructive stricture or lesion (less likely). Recommend clinical correlation and follow-up. 2. The appendix is within normal limits. 3. Small hiatal hernia. Electronically Signed   By: ARonney AstersM.D.   On: 07/18/2021 17:29    Procedures Procedures   Medications Ordered in ED Medications  promethazine (PHENERGAN) 25 mg in sodium chloride 0.9 % 50 mL IVPB (0 mg Intravenous Stopped 07/18/21 1545)  HYDROmorphone (DILAUDID) injection 0.5 mg (has no administration in time range)  sodium chloride 0.9 % bolus 1,000  mL (0 mLs Intravenous Stopped 07/18/21 1513)  fentaNYL (SUBLIMAZE) injection 50 mcg (50 mcg Intravenous Given 07/18/21 1511)  HYDROmorphone (DILAUDID) injection 0.5 mg (0.5 mg Intravenous Given 07/18/21 1731)  iohexol (OMNIPAQUE) 300 MG/ML solution 100 mL (100 mLs Intravenous Contrast Given 07/18/21 1714)    ED Course/ Medical Decision Making/ A&P Clinical Course as of 07/18/21 2054  Fri Jul 18, 2021  1648 On reevaluation, patient states she is feeling no better.  She still having the urge to vomit and still having pain in the right lower quadrant.  Initial history, patient was not complaining of right lower quadrant abdominal pain. [CF]  9892 On repeat evaluation, patient states her pain is improved.  I notified her of all labs and imaging.  She was reassured.  We will go to try and fluid challenge her and see how she does. [CF]  1194 Patient did not pass fluid challenge.  Upon drinking a few sips of water she began dry heaving and throwing up in the toilet. [CF]  1828 Comprehensive metabolic panel(!) No significant abnormalities. [CF]  1829 Urinalysis, Routine w reflex microscopic Urine, Clean Catch(!) There is evidence of pyuria with negative nitrites.  Could be a start of a UTI. [CF]  1829 CBC with Differential(!) Essentially normal. [CF]  1829 Lipase,  blood Negative. [CF]  1829 CT ABDOMEN PELVIS W CONTRAST I personally ordered and interpreted a CT abdomen pelvis with contrast which did not reveal any signs of appendicitis, diverticulitis.  There is some hydronephrosis in the right kidney.  Please see radiology note for further detail.  I do agree with the radiologist interpretation. [CF]  1845 I spoke with Dr. Josephine Cables with [facility] who recommends consulting urology and calling him back. [CF]  1947 I spoke with Dr. Alinda Money with urology who does not feel that the findings on CT are related to her symptomology today.  He agrees to consult and the patient will likely need outpatient follow-up once her acute symptoms are managed and resolved. [CF]  2052 I spoke with Dr. Josephine Cables with [facility] who agrees to admit the patient. [CF]    Clinical Course User Index [CF] Hendricks Limes, PA-C                           Medical Decision Making ELLAYNA HILLIGOSS is a 46 y.o. female patient who presents to the emerged department today for further evaluation of intractable nausea and vomiting.  Patient is in no acute distress and resting comfortably in the emergency department.  Vital signs are normal.  We will give her a liter of fluid, Phenergan, and get labs to further assess electrolyte abnormalities, kidney function, liver function.   Amount and/or Complexity of Data Reviewed Labs: ordered. Decision-making details documented in ED Course. Radiology: ordered and independent interpretation performed. Decision-making details documented in ED Course.  Risk Prescription drug management. Decision regarding hospitalization. Risk Details: Patient still complaining of right lower quadrant abdominal pain after multiple rounds of pain medication and she is unable to keep anything down.  Patient did not pass fluid challenge and considering her comorbidities at home and the medications he takes I do feel that the patient would likely benefit from  further evaluation in the hospital.  I will work on getting her admitted to the hospitalist service.    Final Clinical Impression(s) / ED Diagnoses Final diagnoses:  Intractable nausea and vomiting    Rx / DC Orders ED Discharge Orders  None         Cherrie Gauze 07/18/21 2054    Milton Ferguson, MD 07/25/21 [phone]

## 2021-07-18 NOTE — ED Triage Notes (Signed)
Pt seen at [place] ED, then came to [facility] and they sent her here due to abnormal wbc. A/O. Nausea in triage. N/v x 1 day, 12 times in last 24 hours. Remission for bladder cancer. Denies diarrhea. C/o headache and c/o pain to mid chest from vomiting. Color wnl.

## 2021-07-19 DIAGNOSIS — I2510 Atherosclerotic heart disease of native coronary artery without angina pectoris: Secondary | ICD-10-CM | POA: Diagnosis not present

## 2021-07-19 DIAGNOSIS — K219 Gastro-esophageal reflux disease without esophagitis: Secondary | ICD-10-CM

## 2021-07-19 DIAGNOSIS — K5900 Constipation, unspecified: Secondary | ICD-10-CM

## 2021-07-19 DIAGNOSIS — E1165 Type 2 diabetes mellitus with hyperglycemia: Secondary | ICD-10-CM

## 2021-07-19 DIAGNOSIS — N1330 Unspecified hydronephrosis: Secondary | ICD-10-CM

## 2021-07-19 DIAGNOSIS — R296 Repeated falls: Secondary | ICD-10-CM

## 2021-07-19 DIAGNOSIS — R112 Nausea with vomiting, unspecified: Secondary | ICD-10-CM

## 2021-07-19 DIAGNOSIS — Z794 Long term (current) use of insulin: Secondary | ICD-10-CM

## 2021-07-19 DIAGNOSIS — N134 Hydroureter: Secondary | ICD-10-CM

## 2021-07-19 LAB — COMPREHENSIVE METABOLIC PANEL
ALT: 22 U/L (ref 0–44)
AST: 21 U/L (ref 15–41)
Albumin: 3.4 g/dL — ABNORMAL LOW (ref 3.5–5.0)
Alkaline Phosphatase: 104 U/L (ref 38–126)
Anion gap: 7 (ref 5–15)
BUN: 13 mg/dL (ref 6–20)
CO2: 25 mmol/L (ref 22–32)
Calcium: 8.9 mg/dL (ref 8.9–10.3)
Chloride: 107 mmol/L (ref 98–111)
Creatinine, Ser: 0.84 mg/dL (ref 0.44–1.00)
GFR, Estimated: >60 mL/min (ref >60)
Glucose, Bld: 127 mg/dL — ABNORMAL HIGH (ref 70–99)
Potassium: 3.9 mmol/L (ref 3.5–5.1)
Sodium: 139 mmol/L (ref 135–145)
Total Bilirubin: 0.4 mg/dL (ref 0.3–1.2)
Total Protein: 6.8 g/dL (ref 6.5–8.1)

## 2021-07-19 LAB — GLUCOSE, CAPILLARY
Glucose-Capillary: 141 mg/dL — ABNORMAL HIGH (ref 70–99)
Glucose-Capillary: 135 mg/dL — ABNORMAL HIGH (ref 70–99)
Glucose-Capillary: 124 mg/dL — ABNORMAL HIGH (ref 70–99)
Glucose-Capillary: 194 mg/dL — ABNORMAL HIGH (ref 70–99)

## 2021-07-19 LAB — MAGNESIUM: Magnesium: 2.1 mg/dL (ref 1.7–2.4)

## 2021-07-19 LAB — CBC
HCT: 40.6 % (ref 36.0–46.0)
Hemoglobin: 12.5 g/dL (ref 12.0–15.0)
MCH: 25.7 pg — ABNORMAL LOW (ref 26.0–34.0)
MCHC: 30.8 g/dL (ref 30.0–36.0)
MCV: 83.5 fL (ref 80.0–100.0)
Platelets: 324 10*3/uL (ref 150–400)
RBC: 4.86 MIL/uL (ref 3.87–5.11)
RDW: 14.2 % (ref 11.5–15.5)
WBC: 7.3 10*3/uL (ref 4.0–10.5)
nRBC: 0 % (ref 0.0–0.2)

## 2021-07-19 LAB — PHOSPHORUS: Phosphorus: 4 mg/dL (ref 2.5–4.6)

## 2021-07-19 LAB — HIV ANTIBODY (ROUTINE TESTING W REFLEX): HIV Screen 4th Generation wRfx: NONREACTIVE

## 2021-07-19 LAB — TROPONIN I (HIGH SENSITIVITY): Troponin I (High Sensitivity): 9 ng/L (ref <18)

## 2021-07-19 LAB — APTT: aPTT: 31 seconds (ref 24–36)

## 2021-07-19 MED ORDER — EVOLOCUMAB 140 MG/ML ~~LOC~~ SOAJ
1.0000 | SUBCUTANEOUS | Status: DC
Start: 2021-07-19 — End: 2021-07-19

## 2021-07-19 MED ORDER — ACETAMINOPHEN 650 MG RE SUPP: 650.0000 mg | Freq: Four times a day (QID) | RECTAL | Status: DC | PRN

## 2021-07-19 MED ORDER — ACETAMINOPHEN 325 MG PO TABS
650.0000 mg | ORAL_TABLET | Freq: Four times a day (QID) | ORAL | Status: DC | PRN
  Administered 2021-07-21 – 2021-07-22 (×3): 650 mg via ORAL
  Filled 2021-07-19 (×3): qty 2

## 2021-07-19 MED ORDER — PROMETHAZINE HCL 25 MG/ML IJ SOLN
INTRAMUSCULAR | Status: AC
  Filled 2021-07-19: qty 1

## 2021-07-19 MED ORDER — ASPIRIN 81 MG PO TBEC
81.0000 mg | DELAYED_RELEASE_TABLET | Freq: Every day | ORAL | Status: DC
  Administered 2021-07-19 – 2021-07-24 (×6): 81 mg via ORAL
  Filled 2021-07-19 (×6): qty 1

## 2021-07-19 MED ORDER — ONDANSETRON HCL 4 MG PO TABS: 4.0000 mg | ORAL_TABLET | Freq: Four times a day (QID) | ORAL | Status: DC | PRN

## 2021-07-19 MED ORDER — NITROGLYCERIN 0.4 MG SL SUBL: 0.4000 mg | SUBLINGUAL_TABLET | SUBLINGUAL | Status: DC | PRN

## 2021-07-19 MED ORDER — PANTOPRAZOLE SODIUM 40 MG PO TBEC
40.0000 mg | DELAYED_RELEASE_TABLET | Freq: Two times a day (BID) | ORAL | Status: DC
  Administered 2021-07-19 – 2021-07-24 (×11): 40 mg via ORAL
  Filled 2021-07-19 (×11): qty 1

## 2021-07-19 MED ORDER — ONDANSETRON HCL 4 MG/2ML IJ SOLN
4.0000 mg | Freq: Four times a day (QID) | INTRAMUSCULAR | Status: DC | PRN
  Administered 2021-07-23: 4 mg via INTRAVENOUS
  Filled 2021-07-19: qty 2

## 2021-07-19 MED ORDER — LINACLOTIDE 72 MCG PO CAPS
72.0000 ug | ORAL_CAPSULE | Freq: Every day | ORAL | Status: DC
  Administered 2021-07-19 – 2021-07-20 (×2): 72 ug via ORAL
  Filled 2021-07-19 (×3): qty 1

## 2021-07-19 MED ORDER — FENOFIBRATE 160 MG PO TABS
160.0000 mg | ORAL_TABLET | Freq: Every day | ORAL | Status: DC
  Administered 2021-07-19 – 2021-07-24 (×6): 160 mg via ORAL
  Filled 2021-07-19 (×6): qty 1

## 2021-07-19 MED ORDER — ALUM & MAG HYDROXIDE-SIMETH 200-200-20 MG/5ML PO SUSP
30.0000 mL | Freq: Once | ORAL | Status: AC
  Administered 2021-07-19: 30 mL via ORAL
  Filled 2021-07-19: qty 30

## 2021-07-19 MED ORDER — ENOXAPARIN SODIUM 40 MG/0.4ML IJ SOSY
40.0000 mg | PREFILLED_SYRINGE | INTRAMUSCULAR | Status: DC
  Administered 2021-07-19 – 2021-07-23 (×5): 40 mg via SUBCUTANEOUS
  Filled 2021-07-19 (×6): qty 0.4

## 2021-07-19 MED ORDER — ROSUVASTATIN CALCIUM 20 MG PO TABS
40.0000 mg | ORAL_TABLET | Freq: Every day | ORAL | Status: DC
  Administered 2021-07-19 – 2021-07-24 (×6): 40 mg via ORAL
  Filled 2021-07-19 (×6): qty 2

## 2021-07-19 NOTE — Progress Notes (Signed)
Patient has Evolovumab SOAJ subcutaneous injection scheduled for 0130. Spoke with Charleston Ropes with  pharmacy it is not kept on hand, Patient will need to have a family bring in medications. Informed by pharmacist not to chart on medication due time so the time may be adjusted if brought in later.   Patient complained of nausea and pain, Dilaudid and Phenergan given at 0135. Patient slept the rest of the night.  At 0510 asked patient if she had Evolovumab injection at home. Patient stated she did. Informed patient to try and get someone to bring the medication in so it can be administered. She stated she would have family bring it in today.

## 2021-07-19 NOTE — Progress Notes (Signed)
PROGRESS NOTE   Sandra [name]  JYN:[id number] DOB: 1975-12-21 DOA: 07/18/2021 PCP: Coolidge Breeze, FNP   Chief Complaint  Patient presents with   Emesis   Level of care: Med-Surg  Brief Admission History:  46 y.o. female with medical history significant of hypertension, dilated cardiomyopathy, bipolar disorder, CAD, bladder cancer (in remission) who presents to the emergency department due to 1 day onset of nausea and vomiting.  Patient states that she woke up around 2 AM on Thursday (7/6) to use the bathroom after this, she complained of right lower quadrant pain, which was associated with nausea and vomiting, patient states that she has not been able to keep anything down since onset of symptoms, she tried Zofran at home, but due to being unable to keep anything down, she vomited the medication.  She also complained of an associated suprapubic cramping.  She denies any burning sensation on urination or any other irritative bladder symptoms.  Patient denies fever, chills, chest pain.,  She states that she went to [facility] with CT scan without contrast was done and it showed right-sided moderate hydronephrosis with no obvious stone seen, but there was questionable evidence of malignancy.  He presented to the ED here at [facility], but she left prior to being seen due to long wait time.  Patient states that her current symptoms are similar to when she was for diagnosis of bladder cancer and she also complained of recurrent falls and imbalance since onset of symptoms.   Assessment and Plan:  Intractable nausea and vomiting Abdominal pain  Continue IV NS at 100 mLs/Hr Continue IV Dilaudid 0.5 mg q.4h p.r.n. for moderate to severe pain Continue IV Zofran p.r.n. Continue clear liquid diet with plan to advanced diet as tolerated Hopefully with supportive measures she will start to improve    Type 2 diabetes mellitus with hyperglycemia Continue ISS and hypoglycemic protocol  CBG (last 3)   Recent Labs    07/18/21 2257 07/19/21 0756 07/19/21 1113  GLUCAP 232* 141* 135*    Familial hypercholesterolemia Continue Repatha, Crestor, Tricor  GERD Continue Protonix   Chronic Constipation Continue Linzess if she can tolerate it  CAD Continue aspirin, nitroglycerin and Crestor   Recurrent falls and imbalance Patient states that she has felt imbalance with falls without injury since onset of symptoms Continue fall precaution and neurochecks Consult PT/OT eval and treat   Right-sided hydroureteronephrosis by CT abdomen and pelvis Urologist was consulted by ED PA and will follow-up with patient  Questionable bladder versus ureteral malignancy per CT abdomen and pelvis Urologist was consulted by ED and reportedly will consult   Obesity class 1 (BMI 34.34 kg/m) Continue diet and lifestyle modification   DVT prophylaxis: enoxaparin Code Status: full  Family Communication:  Disposition: Status is: Inpatient Remains inpatient appropriate because: intensity, IV medications required   Consultants:   Procedures:   Antimicrobials:    Subjective: Pt reports she still has uncontrolled nausea and vomiting.  No abdominal pain, still urinating, no flank pain.   Objective: Vitals:   07/18/21 2059 07/18/21 2352 07/19/21 0031 07/19/21 0326  BP: (!) 138/98 (!) 159/89 138/73 136/77  Pulse: 74 67 64 70  Resp: '18 16 18 18  '$ Temp: 98.2 F (36.8 C) 98 F (36.7 C) 98.5 F (36.9 C) 97.9 F (36.6 C)  TempSrc: Oral  Oral Oral  SpO2: 98% 98% 95% 96%  Weight:      Height:        Intake/Output Summary (Last  24 hours) at 07/19/2021 1224 Last data filed at 07/19/2021 0900 Gross per 24 hour  Intake 1938.15 ml  Output --  Net 1938.15 ml   Filed Weights   07/18/21 1306  Weight: 77.1 kg   Examination:  General exam: Appears calm and comfortable  Respiratory system: Clear to auscultation. Respiratory effort normal. Cardiovascular system: normal S1 & S2 heard. No JVD,  murmurs, rubs, gallops or clicks. No pedal edema. Gastrointestinal system: Abdomen is nondistended, soft and nontender. No organomegaly or masses felt. Normal bowel sounds heard. Central nervous system: Alert and oriented. No focal neurological deficits. Extremities: Symmetric 5 x 5 power. Skin: No rashes, lesions or ulcers. Psychiatry: Judgement and insight appear normal. Mood & affect appropriate.   Data Reviewed: I have personally reviewed following labs and imaging studies  CBC: Recent Labs  Lab 07/17/21 2019 07/18/21 1317 07/19/21 0426  WBC 15.7* 9.0 7.3  NEUTROABS  --  4.8  --   HGB 13.8 13.4 12.5  HCT 43.7 42.2 40.6  MCV 80.2 81.0 83.5  PLT 414* 371 532    Basic Metabolic Panel: Recent Labs  Lab 07/17/21 2019 07/18/21 1317 07/19/21 0426  NA 136 136 139  K 4.2 4.0 3.9  CL 103 102 107  CO2 '24 25 25  '$ GLUCOSE 155* 152* 127*  BUN '16 17 13  '$ CREATININE 0.90 0.86 0.84  CALCIUM 9.5 9.2 8.9  MG  --   --  2.1  PHOS  --   --  4.0    CBG: Recent Labs  Lab 07/18/21 1141 07/18/21 2257 07/19/21 0756 07/19/21 1113  GLUCAP 231* 232* 141* 135*    No results found for this or any previous visit (from the past 240 hour(s)).   Radiology Studies: CT ABDOMEN PELVIS W CONTRAST  Result Date: 07/18/2021 CLINICAL DATA:  Right lower quadrant abdominal pain. EXAM: CT ABDOMEN AND PELVIS WITH CONTRAST TECHNIQUE: Multidetector CT imaging of the abdomen and pelvis was performed using the standard protocol following bolus administration of intravenous contrast. RADIATION DOSE REDUCTION: This exam was performed according to the departmental dose-optimization program which includes automated exposure control, adjustment of the mA and/or kV according to patient size and/or use of iterative reconstruction technique. CONTRAST:  151m OMNIPAQUE IOHEXOL 300 MG/ML  SOLN COMPARISON:  CT abdomen and pelvis 02/24/2018 FINDINGS: Lower chest: No acute abnormality. Hepatobiliary: No focal liver  abnormality is seen. No gallstones, gallbladder wall thickening, or biliary dilatation. Pancreas: Unremarkable. No pancreatic ductal dilatation or surrounding inflammatory changes. Spleen: Normal in size without focal abnormality. Adrenals/Urinary Tract: There is mild right-sided hydroureteronephrosis the level of the bladder. No urinary tract calculi are seen. There is mild right perinephric fat stranding. Left kidney, adrenal glands, and bladder are within normal limits. Stomach/Bowel: Small hiatal hernia is present. Stomach is otherwise within normal limits. Appendix appears normal. No evidence of bowel wall thickening, distention, or inflammatory changes. Vascular/Lymphatic: Aortic atherosclerosis. No enlarged abdominal or pelvic lymph nodes. Reproductive: Status post hysterectomy. No adnexal masses. Other: Small fat containing umbilical hernia. No ascites or free air. Musculoskeletal: No acute or significant osseous findings. Left-sided sacral stimulator device present. IMPRESSION: 1. Mild right-sided hydroureteronephrosis to the level of the bladder with right perinephric fat stranding. No obstructing calculi identified. Findings may be related to recently passed calculus (nonvisualized), infection involving the right kidney and ureter, or distal ureteral obstructive stricture or lesion (less likely). Recommend clinical correlation and follow-up. 2. The appendix is within normal limits. 3. Small hiatal hernia. Electronically Signed   By:  Ronney Asters M.D.   On: 07/18/2021 17:29    Scheduled Meds:  aspirin EC  81 mg Oral Daily   enoxaparin (LOVENOX) injection  40 mg Subcutaneous Q24H   fenofibrate  160 mg Oral Daily   insulin aspart  0-5 Units Subcutaneous QHS   insulin aspart  0-9 Units Subcutaneous TID WC   linaclotide  72 mcg Oral QAC breakfast   pantoprazole  40 mg Oral BID AC   rosuvastatin  40 mg Oral Daily   Continuous Infusions:  sodium chloride 100 mL/hr at 07/19/21 0134   promethazine  (PHENERGAN) injection (IM or IVPB) 25 mg (07/19/21 0945)    LOS: 1 day   Time spent: 68 mins  Tomasa Dobransky Wynetta Emery, MD How to contact the [facility] Attending or Consulting provider [place] or covering provider during after hours [place], for this patient?  Check the care team in [facility] and look for a) attending/consulting [facility] provider listed and b) the [facility] team listed Log into [email or web address] and use [place]'s universal password to access. If you do not have the password, please contact the hospital operator. Locate the [facility] provider you are looking for under [facility] and page to a number that you can be directly reached. If you still have difficulty reaching the provider, please page the [facility] (Director on Call) for the Hospitalists listed on amion for assistance.  07/19/2021, 12:24 PM

## 2021-07-19 NOTE — Consult Note (Signed)
Urology Consult   Physician requesting consult: Dr. Irwin Brakeman  Reason for consult: Right hydroureteronephrosis  History of Present Illness: Sandra [name] is a 46 y.o. with a history of high grade, T1 urothelial carcinoma of the bladder s/p TUR and adjuvant intravesical BCG under the care of Dr. Jeffie Pollock in 2017-2018.  She had a recurrence in 2019 that required repeat TURBT with resection of the right ureteral orifice and required right ureteral stent placement temporarily although this reportedly healed appropriately without stricture or obstruction on early follow up.  She most recently was seen by Dr. Jeffie Pollock in April for surveillance and was told that her cystoscopy was normal.  She developed the acute onset of severe right lower quadrant abdominal pain over the past 48 hrs with associated nausea and vomiting.  She denies fever or hematuria.  CT imaging reveled mild to moderate right hydroureteronephrosis down to the level of the UVJ without an obstructing stone or other clear etiology.  Her pain has continued overnight although is better controlled with pain medication and her nausea is controlled with anti-emetic therapy.    Past Medical History:  Diagnosis Date   ADHD (attention deficit hyperactivity disorder)    Allergic rhinitis    Anemia    hx of   Barrett's esophagus    Bipolar 1 disorder (HCC)    Bladder tumor    CAD (coronary artery disease)    Cancer ([place])    bladder   Constipation    Dilated cardiomyopathy ([place]) 12/20/2020   Echo 6/22 ([facility]): EF 43, mild MR, mild TR // Echo 3/22: EF 50-55   Dizziness    Full dentures    GERD (gastroesophageal reflux disease)    Gross hematuria    Hyperlipidemia    Hypertension    Stable angina (HCC)    Type 2 diabetes mellitus ([place])    Urgency of urination    dysuria, sui   Wears glasses     Past Surgical History:  Procedure Laterality Date   BALLOON DILATION N/A 02/17/2021   Procedure: BALLOON DILATION;  Surgeon: Eloise Harman, DO;  [facility];  Service: Endoscopy;  Laterality: N/A;   BIOPSY  02/17/2021   Procedure: BIOPSY;  Surgeon: Eloise Harman, DO;  [facility]: [facility];  Service: Endoscopy;;   COLONOSCOPY WITH PROPOFOL N/A 02/17/2021   Surgeon: Eloise Harman, DO; nonbleeding internal hemorrhoids, otherwise normal exam.  Repeat in 10 years.   CYSTOSCOPY N/A 10/03/2015   Procedure: CYSTOSCOPY;  Surgeon: Irine Seal, MD;  Location: [facility];  Service: Urology;  Laterality: N/A;   CYSTOSCOPY WITH STENT PLACEMENT Right 10/31/2015   Procedure: CYSTOSCOPY WITH ATTEMPTED RIGHT URETERAL OPENING;  Surgeon: Irine Seal, MD;  Location: [facility];  Service: Urology;  Laterality: Right;   ESOPHAGOGASTRODUODENOSCOPY (EGD) WITH PROPOFOL N/A 02/17/2021   Surgeon: Eloise Harman, DO; 2 cm hiatal hernia, Barrett's esophagus, gastritis with biopsies negative for H. pylori.  S/p empiric esophageal dilation.  Repeat in 3 years.   FOOT SURGERY Left    September 2022, broke 7 bones in left foot   INTERSTIM IMPLANT PLACEMENT N/A 03/14/2018   Procedure: Barrie Lyme IMPLANT FIRST STAGE;  Surgeon: Cleon Gustin, MD;  Location: [facility];  Service: Urology;  Laterality: N/A;   INTERSTIM IMPLANT PLACEMENT N/A 03/30/2018   Procedure: Barrie Lyme IMPLANT SECOND STAGE;  Surgeon: Cleon Gustin, MD;  Location: [facility];  Service: Urology;  Laterality: N/A;   LEFT HEART CATH AND  CORONARY ANGIOGRAPHY N/A 05/31/2020   Procedure: LEFT HEART CATH AND CORONARY ANGIOGRAPHY;  Surgeon: Troy Sine, MD;  Location: [facility];  Service: Cardiovascular;  Laterality: N/A;   MULTIPLE TOOTH EXTRACTIONS  2015   OVARIAN CYST REMOVAL Right 2004 approx   SHOULDER OPEN ROTATOR CUFF REPAIR Left 04/20/2019   Procedure: ROTATOR CUFF REPAIR SHOULDER OPEN;  Surgeon: Carole Civil, MD;  Location: [facility];  Service: Orthopedics;  Laterality: Left;   TONSILLECTOMY  12/30/2004   TOTAL  ABDOMINAL HYSTERECTOMY W/ BILATERAL SALPINGOOPHORECTOMY  05/16/2004   TRANSURETHRAL RESECTION OF BLADDER TUMOR N/A 10/03/2015   Procedure: TRANSURETHRAL RESECTION OF BLADDER TUMOR (TURBT);  Surgeon: Irine Seal, MD;  Location: [facility];  Service: Urology;  Laterality: N/A;   TRANSURETHRAL RESECTION OF BLADDER TUMOR N/A 10/31/2015   Procedure: RE-STAGINGG TRANSURETHRAL RESECTION OF BLADDER TUMOR (TURBT);  Surgeon: Irine Seal, MD;  Location: [facility];  Service: Urology;  Laterality: N/A;   TRANSURETHRAL RESECTION OF BLADDER TUMOR N/A 08/17/2016   Procedure: TRANSURETHRAL RESECTION OF BLADDER TUMOR (TURBT);  Surgeon: Cleon Gustin, MD;  Location: [facility];  Service: Urology;  Laterality: N/A;     Current Hospital Medications:  Home meds:  No current facility-administered medications on file prior to encounter.   Current Outpatient Medications on File Prior to Encounter  Medication Sig Dispense Refill   albuterol (PROAIR HFA) 108 (90 Base) MCG/ACT inhaler Inhale 2 puffs into the lungs every 6 (six) hours as needed for wheezing or shortness of breath.     aspirin EC 81 MG tablet Take 1 tablet (81 mg total) by mouth daily. Swallow whole. 30 tablet 11   buPROPion (WELLBUTRIN XL) 300 MG 24 hr tablet Take 300 mg by mouth every morning.     cloNIDine HCl (KAPVAY) 0.1 MG TB12 ER tablet 1 tablet at bedtime.     doxycycline (VIBRA-TABS) 100 MG tablet Take 100 mg by mouth daily.     Evolocumab (REPATHA SURECLICK) 094 MG/ML SOAJ Inject 1 Dose into the skin every 14 (fourteen) days. 2 mL 11   fenofibrate (TRICOR) 145 MG tablet Take 1 tablet (145 mg total) by mouth daily. 90 tablet 3   FLUoxetine (PROZAC) 20 MG capsule Take 20 mg by mouth daily. Takes  at bedtime     FLUoxetine (PROZAC) 40 MG capsule Take 40 mg by mouth daily.     gabapentin (NEURONTIN) 300 MG capsule TAKE 1 CAPSULE(300 MG) BY MOUTH THREE TIMES DAILY (Patient taking differently: Take 300 mg by mouth 3  (three) times daily.) 90 capsule 0   HYDROcodone-acetaminophen (NORCO/VICODIN) 5-325 MG tablet Take 1 tablet by mouth every 6 (six) hours as needed.     hydrocortisone (ANUSOL-HC) 2.5 % rectal cream Place 1 application rectally 2 (two) times daily. 30 g 1   insulin aspart (NOVOLOG) 100 UNIT/ML FlexPen Inject 10-16 Units into the skin 3 (three) times daily with meals. (Patient taking differently: Inject 10-16 Units into the skin 3 (three) times daily with meals. Sliding scale) 30 mL 3   insulin glargine (LANTUS) 100 UNIT/ML Solostar Pen Inject 45 Units into the skin at bedtime. (Patient taking differently: Inject 30 Units into the skin at bedtime.) 45 mL 0   linaclotide (LINZESS) 72 MCG capsule Take 1 capsule (72 mcg total) by mouth daily before breakfast. 30 capsule 5   nitroGLYCERIN (NITROSTAT) 0.4 MG SL tablet Place 1 tablet (0.4 mg total) under the tongue every 5 (five) minutes as needed for chest pain. 25  tablet 11   pantoprazole (PROTONIX) 40 MG tablet Take 1 tablet (40 mg total) by mouth 2 (two) times daily before a meal. 60 tablet 5   rosuvastatin (CRESTOR) 40 MG tablet Take 1 tablet (40 mg total) by mouth daily. 90 tablet 3   tamsulosin (FLOMAX) 0.4 MG CAPS capsule Take 1 capsule (0.4 mg total) by mouth at bedtime. 30 capsule 11   VYVANSE 40 MG capsule Take 40 mg by mouth every morning.     ACCU-CHEK GUIDE test strip USE AS INSTRUCTED TO MONITOR GLUCOSE 4 TIMES DAILY BEFORE MEALS AND BEFORE BED. 400 strip 2   Accu-Chek Softclix Lancets lancets Use as instructed to monitor glucose 4 times daily 100 each 12   Blood Glucose Monitoring Suppl (ACCU-CHEK GUIDE) w/Device KIT 1 Piece by Does not apply route as directed. 1 kit 0   Blood Pressure Monitoring (BLOOD PRESSURE CUFF) MISC See admin instructions.     Continuous Blood Gluc Sensor (DEXCOM G6 SENSOR) MISC Apply new sensor every 10 days as directed 9 each 3   Continuous Blood Gluc Transmit (DEXCOM G6 TRANSMITTER) MISC 1 Device by Does not apply  route every 3 (three) months. 3 each 1   GLOBAL EASE INJECT PEN NEEDLES 31G X 5 MM MISC Inject into the skin.     Insulin Pen Needle (PEN NEEDLES) 31G X 6 MM MISC Use to inject insulin 4 times daily 100 each 11     Scheduled Meds:  aspirin EC  81 mg Oral Daily   enoxaparin (LOVENOX) injection  40 mg Subcutaneous Q24H   fenofibrate  160 mg Oral Daily   insulin aspart  0-5 Units Subcutaneous QHS   insulin aspart  0-9 Units Subcutaneous TID WC   linaclotide  72 mcg Oral QAC breakfast   pantoprazole  40 mg Oral BID AC   rosuvastatin  40 mg Oral Daily   Continuous Infusions:  sodium chloride 100 mL/hr at 07/19/21 1315   promethazine (PHENERGAN) injection (IM or IVPB) 25 mg (07/19/21 0945)   PRN Meds:.acetaminophen **OR** acetaminophen, HYDROmorphone (DILAUDID) injection, nitroGLYCERIN, ondansetron **OR** ondansetron (ZOFRAN) IV, promethazine (PHENERGAN) injection (IM or IVPB)  Allergies:  Allergies  Allergen Reactions   Ibuprofen Hives    itching itching itching   Benazepril Hives   Diphenhydramine Hives    Other reaction(s): hives Other reaction(s): hives   Metronidazole Nausea And Vomiting    Other reaction(s): nausea Other reaction(s): nausea   Oxycodone     Pt has tolerated hydromorphone in the past and takes Norco at home.   Adhesive [Tape] Rash   Codeine Nausea And Vomiting and Rash    Other reaction(s): hives   Loratadine Rash   Nystatin-Triamcinolone Rash   Penicillins Rash    Has patient had a PCN reaction causing immediate rash, facial/tongue/throat swelling, SOB or lightheadedness with hypotension: No Has patient had a PCN reaction causing severe rash involving mucus membranes or skin necrosis: Yes Has patient had a PCN reaction that required hospitalization: No Has patient had a PCN reaction occurring within the last 10 years: no  If all of the above answers are "NO", then may proceed with Cephalosporin use. Other reaction(s): nausea Other reaction(s): nausea     Family History  Problem Relation Age of Onset   Hypertension Mother    Cancer Mother    Breast cancer Maternal Aunt    Lung cancer Maternal Uncle    Cancer Other    Diabetes Daughter    Colon cancer Neg Hx  Social History:  reports that she quit smoking about 5 months ago. Her smoking use included cigarettes. She has a 5.00 pack-year smoking history. She has never used smokeless tobacco. She reports that she does not drink alcohol and does not use drugs.  ROS: A complete review of systems was performed.  All systems are negative except for pertinent findings as noted.  Physical Exam:  Vital signs in last 24 hours: Temp:  [97.9 F (36.6 C)-98.5 F (36.9 C)] 97.9 F (36.6 C) (07/08 1315) Pulse Rate:  [61-74] 61 (07/08 1315) Resp:  [12-24] 17 (07/08 1315) BP: (103-159)/(59-98) 103/59 (07/08 1315) SpO2:  [95 %-100 %] 97 % (07/08 1315) Constitutional:  Alert and oriented, No acute distress Cardiovascular: Regular rate and rhythm, No JVD Respiratory: Normal respiratory effort, Lungs clear bilaterally GI: Abdomen is soft, nontender, nondistended, no abdominal masses GU: No CVA tenderness Lymphatic: No lymphadenopathy Neurologic: Grossly intact, no focal deficits Psychiatric: Normal mood and affect  Laboratory Data:  Recent Labs    07/17/21 2019 07/18/21 1317 07/19/21 0426  WBC 15.7* 9.0 7.3  HGB 13.8 13.4 12.5  HCT 43.7 42.2 40.6  PLT 414* 371 324    Recent Labs    07/17/21 2019 07/18/21 1317 07/19/21 0426  NA 136 136 139  K 4.2 4.0 3.9  CL 103 102 107  GLUCOSE 155* 152* 127*  BUN _0 CALCIUM 9.5 9.2 8.9  CREATININE 0.90 0.86 0.84     Results for orders placed or performed during the hospital encounter of 07/18/21 (from the past 24 hour(s))  CBG monitoring, ED     Status: Abnormal   Collection Time: 07/18/21 10:57 PM  Result Value Ref Range   Glucose-Capillary 232 (H) 70 - 99 mg/dL  Comprehensive metabolic panel     Status: Abnormal    Collection Time: 07/19/21  4:26 AM  Result Value Ref Range   Sodium 139 135 - 145 mmol/L   Potassium 3.9 3.5 - 5.1 mmol/L   Chloride 107 98 - 111 mmol/L   CO2 25 22 - 32 mmol/L   Glucose, Bld 127 (H) 70 - 99 mg/dL   BUN 13 6 - 20 mg/dL   Creatinine, Ser 0.84 0.44 - 1.00 mg/dL   Calcium 8.9 8.9 - 10.3 mg/dL   Total Protein 6.8 6.5 - 8.1 g/dL   Albumin 3.4 (L) 3.5 - 5.0 g/dL   AST 21 15 - 41 U/L   ALT 22 0 - 44 U/L   Alkaline Phosphatase 104 38 - 126 U/L   Total Bilirubin 0.4 0.3 - 1.2 mg/dL   GFR, Estimated >60 >60 mL/min   Anion gap 7 5 - 15  CBC     Status: Abnormal   Collection Time: 07/19/21  4:26 AM  Result Value Ref Range   WBC 7.3 4.0 - 10.5 K/uL   RBC 4.86 3.87 - 5.11 MIL/uL   Hemoglobin 12.5 12.0 - 15.0 g/dL   HCT 40.6 36.0 - 46.0 %   MCV 83.5 80.0 - 100.0 fL   MCH 25.7 (L) 26.0 - 34.0 pg   MCHC 30.8 30.0 - 36.0 g/dL   RDW 14.2 11.5 - 15.5 %   Platelets 324 150 - 400 K/uL   nRBC 0.0 0.0 - 0.2 %  APTT     Status: None   Collection Time: 07/19/21  4:26 AM  Result Value Ref Range   aPTT 31 24 - 36 seconds  Magnesium     Status: None   Collection  Time: 07/19/21  4:26 AM  Result Value Ref Range   Magnesium 2.1 1.7 - 2.4 mg/dL  Phosphorus     Status: None   Collection Time: 07/19/21  4:26 AM  Result Value Ref Range   Phosphorus 4.0 2.5 - 4.6 mg/dL  Glucose, capillary     Status: Abnormal   Collection Time: 07/19/21  7:56 AM  Result Value Ref Range   Glucose-Capillary 141 (H) 70 - 99 mg/dL  Glucose, capillary     Status: Abnormal   Collection Time: 07/19/21 11:13 AM  Result Value Ref Range   Glucose-Capillary 135 (H) 70 - 99 mg/dL   No results found for this or any previous visit (from the past 240 hour(s)).  Renal Function: Recent Labs    07/17/21 2019 07/18/21 1317 07/19/21 0426  CREATININE 0.90 0.86 0.84   Estimated Creatinine Clearance: 73.4 mL/min (by C-G formula based on SCr of 0.84 mg/dL).  Radiologic Imaging: CT ABDOMEN PELVIS W  CONTRAST  Result Date: 07/18/2021 CLINICAL DATA:  Right lower quadrant abdominal pain. EXAM: CT ABDOMEN AND PELVIS WITH CONTRAST TECHNIQUE: Multidetector CT imaging of the abdomen and pelvis was performed using the standard protocol following bolus administration of intravenous contrast. RADIATION DOSE REDUCTION: This exam was performed according to the departmental dose-optimization program which includes automated exposure control, adjustment of the mA and/or kV according to patient size and/or use of iterative reconstruction technique. CONTRAST:  150m OMNIPAQUE IOHEXOL 300 MG/ML  SOLN COMPARISON:  CT abdomen and pelvis 02/24/2018 FINDINGS: Lower chest: No acute abnormality. Hepatobiliary: No focal liver abnormality is seen. No gallstones, gallbladder wall thickening, or biliary dilatation. Pancreas: Unremarkable. No pancreatic ductal dilatation or surrounding inflammatory changes. Spleen: Normal in size without focal abnormality. Adrenals/Urinary Tract: There is mild right-sided hydroureteronephrosis the level of the bladder. No urinary tract calculi are seen. There is mild right perinephric fat stranding. Left kidney, adrenal glands, and bladder are within normal limits. Stomach/Bowel: Small hiatal hernia is present. Stomach is otherwise within normal limits. Appendix appears normal. No evidence of bowel wall thickening, distention, or inflammatory changes. Vascular/Lymphatic: Aortic atherosclerosis. No enlarged abdominal or pelvic lymph nodes. Reproductive: Status post hysterectomy. No adnexal masses. Other: Small fat containing umbilical hernia. No ascites or free air. Musculoskeletal: No acute or significant osseous findings. Left-sided sacral stimulator device present. IMPRESSION: 1. Mild right-sided hydroureteronephrosis to the level of the bladder with right perinephric fat stranding. No obstructing calculi identified. Findings may be related to recently passed calculus (nonvisualized), infection  involving the right kidney and ureter, or distal ureteral obstructive stricture or lesion (less likely). Recommend clinical correlation and follow-up. 2. The appendix is within normal limits. 3. Small hiatal hernia. Electronically Signed   By: ARonney AstersM.D.   On: 07/18/2021 17:29    I independently reviewed the above imaging studies.  Impression/Recommendation: Right hydroureteronephrosis: It is unclear if her current symptoms are related to her hydronephrosis as those symptoms would be more consistent with an acute obstruction and she does have any clear source for acute obstruction (stone, etc).  Possible etiologies for her imaging findings could be recurrent right distal ureteral tumor, stricture, etc.  She will need further evaluation by Dr. WJeffie Pollockwith cystoscopy and right ureteroscopy.  This can be performed as an outpatient as long as her symptoms can be controlled with oral medications.  However, considering the discrepancy between her imaging findings and her symptoms, it would be important to ensure there is no other cause for her GI symptoms and pain.  If there is not, she can be discharged and follow up with Dr. Jeffie Pollock for further evaluation.  Dutch Gray 07/19/2021, 1:19 PM  Pryor Curia. MD   CC: Dr. Irwin Brakeman

## 2021-07-20 DIAGNOSIS — K219 Gastro-esophageal reflux disease without esophagitis: Secondary | ICD-10-CM | POA: Diagnosis not present

## 2021-07-20 DIAGNOSIS — R112 Nausea with vomiting, unspecified: Secondary | ICD-10-CM | POA: Diagnosis not present

## 2021-07-20 DIAGNOSIS — K5900 Constipation, unspecified: Secondary | ICD-10-CM | POA: Diagnosis not present

## 2021-07-20 DIAGNOSIS — I2510 Atherosclerotic heart disease of native coronary artery without angina pectoris: Secondary | ICD-10-CM | POA: Diagnosis not present

## 2021-07-20 LAB — GLUCOSE, CAPILLARY
Glucose-Capillary: 128 mg/dL — ABNORMAL HIGH (ref 70–99)
Glucose-Capillary: 127 mg/dL — ABNORMAL HIGH (ref 70–99)
Glucose-Capillary: 122 mg/dL — ABNORMAL HIGH (ref 70–99)
Glucose-Capillary: 286 mg/dL — ABNORMAL HIGH (ref 70–99)

## 2021-07-20 LAB — BASIC METABOLIC PANEL
Anion gap: 5 (ref 5–15)
BUN: 11 mg/dL (ref 6–20)
CO2: 24 mmol/L (ref 22–32)
Calcium: 8.4 mg/dL — ABNORMAL LOW (ref 8.9–10.3)
Chloride: 110 mmol/L (ref 98–111)
Creatinine, Ser: 0.73 mg/dL (ref 0.44–1.00)
GFR, Estimated: >60 mL/min (ref >60)
Glucose, Bld: 144 mg/dL — ABNORMAL HIGH (ref 70–99)
Potassium: 4 mmol/L (ref 3.5–5.1)
Sodium: 139 mmol/L (ref 135–145)

## 2021-07-20 LAB — LIPASE, BLOOD: Lipase: 23 U/L (ref 11–51)

## 2021-07-20 LAB — LIPID PANEL
Cholesterol: 175 mg/dL (ref 0–200)
HDL: 27 mg/dL — ABNORMAL LOW (ref >40)
LDL Cholesterol: 99 mg/dL (ref 0–99)
Total CHOL/HDL Ratio: 6.5 RATIO
Triglycerides: 245 mg/dL — ABNORMAL HIGH (ref <150)
VLDL: 49 mg/dL — ABNORMAL HIGH (ref 0–40)

## 2021-07-20 MED ORDER — PROMETHAZINE HCL 25 MG/ML IJ SOLN
INTRAMUSCULAR | Status: AC
  Filled 2021-07-20: qty 1

## 2021-07-20 MED ORDER — LOPERAMIDE HCL 2 MG PO CAPS
2.0000 mg | ORAL_CAPSULE | ORAL | Status: DC | PRN
Start: 2021-07-20 — End: 2021-07-24
  Administered 2021-07-20 – 2021-07-21 (×2): 2 mg via ORAL
  Filled 2021-07-20 (×2): qty 1

## 2021-07-20 NOTE — Evaluation (Signed)
Physical Therapy Evaluation Patient Details Name: Sandra [name] MRN: [id number] DOB: [birth year] Today's Date: 07/20/2021  History of Present Illness  Sandra [name] is a 46 y.o. female with medical history significant of hypertension, dilated cardiomyopathy, bipolar disorder, CAD, bladder cancer (in remission) who presents to the emergency department due to 1 day onset of nausea and vomiting.  Patient states that she woke up around 2 AM on Thursday (7/6) to use the bathroom after this, she complained of right lower quadrant pain, which was associated with nausea and vomiting, patient states that she has not been able to keep anything down since onset of symptoms, she tried Zofran at home, but due to being unable to keep anything down, she vomited the medication.  She also complained of an associated suprapubic cramping.  She denies any burning sensation on urination or any other irritative bladder symptoms.  Patient denies fever, chills, chest pain.,  She states that she went to [facility] with CT scan without contrast was done and it showed right-sided moderate hydronephrosis with no obvious stone seen, but there was questionable evidence of malignancy.  He presented to the ED here at [facility], but she left prior to being seen due to long wait time.  Patient states that her current symptoms are similar to when she was for diagnosis of bladder cancer and she also complained of recurrent falls and imbalance since onset of symptoms.   Clinical Impression  Patient limited for functional mobility as stated below secondary to BLE weakness, fatigue and impaired balance. Patient does not require assist for bed mobility and demonstrates good sitting balance. Patient able to transfer to standing without AD and is slightly unsteady initially. Patient able to ambulate to bathroom without AD with slight unsteadiness. Gait, balance, and confidence improve when ambulating with RW. Patient with frequent falls at home  recently and would benefit from use of RW to improve balance and reduce the risk for falls.  Patient will benefit from continued physical therapy in hospital and recommended venue below to increase strength, balance, endurance for safe ADLs and gait.        Recommendations for follow up therapy are one component of a multi-disciplinary discharge planning process, led by the attending physician.  Recommendations may be updated based on patient status, additional functional criteria and insurance authorization.  Follow Up Recommendations Home health PT      Assistance Recommended at Discharge PRN  Patient can return home with the following  Assistance with cooking/housework    Equipment Recommendations Rolling walker (2 wheels)  Recommendations for Other Services       Functional Status Assessment Patient has had a recent decline in their functional status and demonstrates the ability to make significant improvements in function in a reasonable and predictable amount of time.     Precautions / Restrictions Precautions Precautions: Fall Restrictions Weight Bearing Restrictions: No      Mobility  Bed Mobility Overal bed mobility: Independent                  Transfers Overall transfer level: Independent Equipment used: None               General transfer comment: slightly unsteady upon standing initially    Ambulation/Gait Ambulation/Gait assistance: Min guard Gait Distance (Feet): 100 Feet Assistive device: None, Rolling walker (2 wheels) Gait Pattern/deviations: Step-through pattern, Trunk flexed Gait velocity: decreased     General Gait Details: able to ambulate without AD with some unsteadiness, gait  and confidence improve with use of RW  Stairs            Wheelchair Mobility    Modified Rankin (Stroke Patients Only)       Balance Overall balance assessment: Needs assistance Sitting-balance support: No upper extremity supported, Feet  supported Sitting balance-Leahy Scale: Good Sitting balance - Comments: seated EOB   Standing balance support: No upper extremity supported Standing balance-Leahy Scale: Fair Standing balance comment: good with RW                             Pertinent Vitals/Pain Pain Assessment Pain Assessment: No/denies pain    Home Living Family/patient expects to be discharged to:: Private residence Living Arrangements: Children;Spouse/significant other Available Help at Discharge: Family Type of Home: Mobile home Home Access: Level entry       Home Layout: One level Home Equipment: None      Prior Function Prior Level of Function : Independent/Modified Independent;History of Falls (last six months)             Mobility Comments: states household ambulation without AD, frequent falls recently ADLs Comments: independent     Hand Dominance        Extremity/Trunk Assessment   Upper Extremity Assessment Upper Extremity Assessment: Defer to OT evaluation    Lower Extremity Assessment Lower Extremity Assessment: Generalized weakness;Overall [facility] for tasks assessed    Cervical / Trunk Assessment Cervical / Trunk Assessment: Normal  Communication   Communication: No difficulties  Cognition Arousal/Alertness: Awake/alert Behavior During Therapy: WFL for tasks assessed/performed Overall Cognitive Status: Within Functional Limits for tasks assessed                                          General Comments      Exercises     Assessment/Plan    PT Assessment Patient needs continued PT services  PT Problem List Decreased strength;Decreased mobility;Decreased activity tolerance;Decreased balance       PT Treatment Interventions DME instruction;Therapeutic activities;Gait training;Therapeutic exercise;Patient/family education;Stair training;Balance training;Functional mobility training;Neuromuscular re-education;Manual techniques    PT Goals  (Current goals can be found in the Care Plan section)  Acute Rehab PT Goals Patient Stated Goal: return home PT Goal Formulation: With patient Time For Goal Achievement: 07/27/21 Potential to Achieve Goals: Good    Frequency Min 3X/week     Co-evaluation               AM-PAC PT "6 Clicks" Mobility  Outcome Measure Help needed turning from your back to your side while in a flat bed without using bedrails?: None Help needed moving from lying on your back to sitting on the side of a flat bed without using bedrails?: None Help needed moving to and from a bed to a chair (including a wheelchair)?: None Help needed standing up from a chair using your arms (e.g., wheelchair or bedside chair)?: None Help needed to walk in hospital room?: A Little Help needed climbing 3-5 steps with a railing? : A Little 6 Click Score: 22    End of Session   Activity Tolerance: Patient tolerated treatment well;Patient limited by fatigue Patient left: in bed;with call bell/phone within reach Nurse Communication: Mobility status PT Visit Diagnosis: Unsteadiness on feet (R26.81);Other abnormalities of gait and mobility (R26.89);Muscle weakness (generalized) (M62.81)    Time: 6283-1517 PT Time  Calculation (min) (ACUTE ONLY): 12 min   Charges:   PT Evaluation $PT Eval Low Complexity: 1 Low PT Treatments $Therapeutic Activity: 8-22 mins        11:04 AM, 07/20/21 Mearl Latin PT, DPT Physical Therapist at [facility]

## 2021-07-20 NOTE — Progress Notes (Signed)
PROGRESS NOTE   Sandra [name]  WNI:[id number] DOB: 1975-09-20 DOA: 07/18/2021 PCP: Coolidge Breeze, FNP   Chief Complaint  Patient presents with   Emesis   Level of care: Med-Surg  Brief Admission History:  46 y.o. female with medical history significant of hypertension, dilated cardiomyopathy, bipolar disorder, CAD, bladder cancer (in remission) who presents to the emergency department due to 1 day onset of nausea and vomiting.  Patient states that she woke up around 2 AM on Thursday (7/6) to use the bathroom after this, she complained of right lower quadrant pain, which was associated with nausea and vomiting, patient states that she has not been able to keep anything down since onset of symptoms, she tried Zofran at home, but due to being unable to keep anything down, she vomited the medication.  She also complained of an associated suprapubic cramping.  She denies any burning sensation on urination or any other irritative bladder symptoms.  Patient denies fever, chills, chest pain.,  She states that she went to [facility] with CT scan without contrast was done and it showed right-sided moderate hydronephrosis with no obvious stone seen, but there was questionable evidence of malignancy.  He presented to the ED here at [facility], but she left prior to being seen due to long wait time.  Patient states that her current symptoms are similar to when she was for diagnosis of bladder cancer and she also complained of recurrent falls and imbalance since onset of symptoms.   Assessment and Plan:  Intractable nausea and vomiting Abdominal pain  Continue IV NS at 100 mLs/Hr Continue IV Dilaudid 0.5 mg q.4h p.r.n. for moderate to severe pain Continue IV Zofran p.r.n. Pt wants to advance diet today     Diarrhea - 2 episodes today - STOP LINZESS - imodium ordered as needed  - continue fluids and supportive care  Type 2 diabetes mellitus with hyperglycemia Continue ISS and hypoglycemic  protocol  CBG (last 3)  Recent Labs    07/19/21 2017 07/20/21 0738 07/20/21 1132  GLUCAP 194* 128* 127*    Familial hypercholesterolemia Continue Repatha, Crestor, Tricor  GERD Continue Protonix   Chronic Constipation Stop Linzess due to loose stools (diarrhea)  CAD Continue aspirin, nitroglycerin and Crestor   Recurrent falls and imbalance Patient states that she has felt imbalance with falls without injury since onset of symptoms Continue fall precaution and neurochecks Consult PT/OT eval and treat -- recommending HHPT and rolling walker   Right-sided hydroureteronephrosis by CT abdomen and pelvis Urologist was consulted - planning to discuss with Dr. Jeffie Pollock if inpatient procedure needed vs outpatient follow up   Questionable bladder versus ureteral malignancy per CT abdomen and pelvis Urologist was consulted by ED and reportedly will consult. Pt to follow up with Dr. Jeffie Pollock outpatient her urologist    Obesity class 1 (BMI 34.34 kg/m) Continue diet and lifestyle modification   DVT prophylaxis: enoxaparin Code Status: full  Family Communication:  Disposition: Status is: Inpatient Remains inpatient appropriate because: intensity, IV medications required   Consultants:  Urology (Dr. Alinda Money) Procedures:   Antimicrobials:    Subjective: Pt continues to report ongoing bladder pain, she started having loose stool today, wants to advance diet today  Objective: Vitals:   07/19/21 1315 07/19/21 2018 07/20/21 0300 07/20/21 1413  BP: (!) 103/59 129/63 129/65 136/79  Pulse: 61 62 (!) 59 (!) 59  Resp: '17 18 18 16  '$ Temp: 97.9 F (36.6 C) 97.7 F (36.5 C) 97.7 F (36.5  C) 98.1 F (36.7 C)  TempSrc:  Oral Oral Oral  SpO2: 97% 95% 96% 98%  Weight:      Height:        Intake/Output Summary (Last 24 hours) at 07/20/2021 1509 Last data filed at 07/20/2021 1200 Gross per 24 hour  Intake 3772.89 ml  Output --  Net 3772.89 ml   Filed Weights   07/18/21 1306  Weight:  77.1 kg   Examination:  General exam: Appears calm and comfortable  Respiratory system: Clear to auscultation. Respiratory effort normal. Cardiovascular system: normal S1 & S2 heard. No JVD, murmurs, rubs, gallops or clicks. No pedal edema. Gastrointestinal system: Abdomen is nondistended, soft and nontender. No organomegaly or masses felt. Normal bowel sounds heard. Central nervous system: Alert and oriented. No focal neurological deficits. Extremities: Symmetric 5 x 5 power. Skin: No rashes, lesions or ulcers. Psychiatry: Judgement and insight appear normal. Mood & affect appropriate.   Data Reviewed: I have personally reviewed following labs and imaging studies  CBC: Recent Labs  Lab 07/17/21 2019 07/18/21 1317 07/19/21 0426  WBC 15.7* 9.0 7.3  NEUTROABS  --  4.8  --   HGB 13.8 13.4 12.5  HCT 43.7 42.2 40.6  MCV 80.2 81.0 83.5  PLT 414* 371 563    Basic Metabolic Panel: Recent Labs  Lab 07/17/21 2019 07/18/21 1317 07/19/21 0426 07/20/21 0451  NA 136 136 139 139  K 4.2 4.0 3.9 4.0  CL 103 102 107 110  CO2 '24 25 25 24  '$ GLUCOSE 155* 152* 127* 144*  BUN '16 17 13 11  '$ CREATININE 0.90 0.86 0.84 0.73  CALCIUM 9.5 9.2 8.9 8.4*  MG  --   --  2.1  --   PHOS  --   --  4.0  --     CBG: Recent Labs  Lab 07/19/21 1113 07/19/21 1621 07/19/21 2017 07/20/21 0738 07/20/21 1132  GLUCAP 135* 124* 194* 128* 127*    No results found for this or any previous visit (from the past 240 hour(s)).   Radiology Studies: CT ABDOMEN PELVIS W CONTRAST  Result Date: 07/18/2021 CLINICAL DATA:  Right lower quadrant abdominal pain. EXAM: CT ABDOMEN AND PELVIS WITH CONTRAST TECHNIQUE: Multidetector CT imaging of the abdomen and pelvis was performed using the standard protocol following bolus administration of intravenous contrast. RADIATION DOSE REDUCTION: This exam was performed according to the departmental dose-optimization program which includes automated exposure control, adjustment  of the mA and/or kV according to patient size and/or use of iterative reconstruction technique. CONTRAST:  131m OMNIPAQUE IOHEXOL 300 MG/ML  SOLN COMPARISON:  CT abdomen and pelvis 02/24/2018 FINDINGS: Lower chest: No acute abnormality. Hepatobiliary: No focal liver abnormality is seen. No gallstones, gallbladder wall thickening, or biliary dilatation. Pancreas: Unremarkable. No pancreatic ductal dilatation or surrounding inflammatory changes. Spleen: Normal in size without focal abnormality. Adrenals/Urinary Tract: There is mild right-sided hydroureteronephrosis the level of the bladder. No urinary tract calculi are seen. There is mild right perinephric fat stranding. Left kidney, adrenal glands, and bladder are within normal limits. Stomach/Bowel: Small hiatal hernia is present. Stomach is otherwise within normal limits. Appendix appears normal. No evidence of bowel wall thickening, distention, or inflammatory changes. Vascular/Lymphatic: Aortic atherosclerosis. No enlarged abdominal or pelvic lymph nodes. Reproductive: Status post hysterectomy. No adnexal masses. Other: Small fat containing umbilical hernia. No ascites or free air. Musculoskeletal: No acute or significant osseous findings. Left-sided sacral stimulator device present. IMPRESSION: 1. Mild right-sided hydroureteronephrosis to the level of the bladder with  right perinephric fat stranding. No obstructing calculi identified. Findings may be related to recently passed calculus (nonvisualized), infection involving the right kidney and ureter, or distal ureteral obstructive stricture or lesion (less likely). Recommend clinical correlation and follow-up. 2. The appendix is within normal limits. 3. Small hiatal hernia. Electronically Signed   By: Ronney Asters M.D.   On: 07/18/2021 17:29    Scheduled Meds:  aspirin EC  81 mg Oral Daily   enoxaparin (LOVENOX) injection  40 mg Subcutaneous Q24H   fenofibrate  160 mg Oral Daily   insulin aspart  0-5  Units Subcutaneous QHS   insulin aspart  0-9 Units Subcutaneous TID WC   linaclotide  72 mcg Oral QAC breakfast   pantoprazole  40 mg Oral BID AC   rosuvastatin  40 mg Oral Daily   Continuous Infusions:  sodium chloride 100 mL/hr at 07/20/21 0906   promethazine (PHENERGAN) injection (IM or IVPB) 25 mg (07/20/21 1147)    LOS: 2 days   Time spent: 35 mins  Nakoa Ganus Wynetta Emery, MD How to contact the [facility] Attending or Consulting provider [place] or covering provider during after hours [place], for this patient?  Check the care team in [facility] and look for a) attending/consulting TRH provider listed and b) the [facility] team listed Log into [email or web address] and use [place]'s universal password to access. If you do not have the password, please contact the hospital operator. Locate the [facility] provider you are looking for under [facility] and page to a number that you can be directly reached. If you still have difficulty reaching the provider, please page the [facility] (Director on Call) for the Hospitalists listed on amion for assistance.  07/20/2021, 3:09 PM

## 2021-07-20 NOTE — Plan of Care (Signed)
  Problem: Acute Rehab PT Goals(only PT should resolve) Goal: Pt Will Ambulate Outcome: Progressing Flowsheets (Taken 07/20/2021 1104) Pt will Ambulate:  > 125 feet  with modified independence  with supervision  with least restrictive assistive device Goal: Pt/caregiver will Perform Home Exercise Program Outcome: Progressing Flowsheets (Taken 07/20/2021 1104) Pt/caregiver will Perform Home Exercise Program:  For increased strengthening  For improved balance  Independently  11:05 AM, 07/20/21 Mearl Latin PT, DPT Physical Therapist at [facility]

## 2021-07-20 NOTE — Progress Notes (Signed)
CSW spoke with Mardene Celeste at [employer] to order a rolling walker per PT request.  Madilyn Fireman, MSW, LCSW Transitions of Care  Clinical Social Worker II [phone]

## 2021-07-21 ENCOUNTER — Inpatient Hospital Stay (HOSPITAL_COMMUNITY): Payer: Medicaid Other

## 2021-07-21 DIAGNOSIS — R112 Nausea with vomiting, unspecified: Secondary | ICD-10-CM | POA: Diagnosis not present

## 2021-07-21 DIAGNOSIS — I2510 Atherosclerotic heart disease of native coronary artery without angina pectoris: Secondary | ICD-10-CM | POA: Diagnosis not present

## 2021-07-21 DIAGNOSIS — R1031 Right lower quadrant pain: Secondary | ICD-10-CM

## 2021-07-21 DIAGNOSIS — N1330 Unspecified hydronephrosis: Secondary | ICD-10-CM | POA: Diagnosis not present

## 2021-07-21 DIAGNOSIS — K5900 Constipation, unspecified: Secondary | ICD-10-CM | POA: Diagnosis not present

## 2021-07-21 LAB — CBC
HCT: 37.2 % (ref 36.0–46.0)
Hemoglobin: 11.5 g/dL — ABNORMAL LOW (ref 12.0–15.0)
MCH: 25.6 pg — ABNORMAL LOW (ref 26.0–34.0)
MCHC: 30.9 g/dL (ref 30.0–36.0)
MCV: 82.9 fL (ref 80.0–100.0)
Platelets: 333 10*3/uL (ref 150–400)
RBC: 4.49 MIL/uL (ref 3.87–5.11)
RDW: 13.7 % (ref 11.5–15.5)
WBC: 6.7 10*3/uL (ref 4.0–10.5)
nRBC: 0 % (ref 0.0–0.2)

## 2021-07-21 LAB — BASIC METABOLIC PANEL
Anion gap: 3 — ABNORMAL LOW (ref 5–15)
BUN: 10 mg/dL (ref 6–20)
CO2: 25 mmol/L (ref 22–32)
Calcium: 8.7 mg/dL — ABNORMAL LOW (ref 8.9–10.3)
Chloride: 111 mmol/L (ref 98–111)
Creatinine, Ser: 0.82 mg/dL (ref 0.44–1.00)
GFR, Estimated: >60 mL/min (ref >60)
Glucose, Bld: 125 mg/dL — ABNORMAL HIGH (ref 70–99)
Potassium: 4 mmol/L (ref 3.5–5.1)
Sodium: 139 mmol/L (ref 135–145)

## 2021-07-21 LAB — GLUCOSE, CAPILLARY
Glucose-Capillary: 115 mg/dL — ABNORMAL HIGH (ref 70–99)
Glucose-Capillary: 143 mg/dL — ABNORMAL HIGH (ref 70–99)
Glucose-Capillary: 171 mg/dL — ABNORMAL HIGH (ref 70–99)
Glucose-Capillary: 194 mg/dL — ABNORMAL HIGH (ref 70–99)

## 2021-07-21 LAB — MAGNESIUM: Magnesium: 1.9 mg/dL (ref 1.7–2.4)

## 2021-07-21 MED ORDER — PROMETHAZINE HCL 25 MG/ML IJ SOLN
INTRAMUSCULAR | Status: AC
  Filled 2021-07-21: qty 1

## 2021-07-21 MED ORDER — HYDROXYZINE HCL 25 MG PO TABS
50.0000 mg | ORAL_TABLET | Freq: Three times a day (TID) | ORAL | Status: DC | PRN
  Administered 2021-07-21: 50 mg via ORAL
  Filled 2021-07-21: qty 2

## 2021-07-21 MED ORDER — KETOROLAC TROMETHAMINE 15 MG/ML IJ SOLN
15.0000 mg | Freq: Three times a day (TID) | INTRAMUSCULAR | Status: DC
  Administered 2021-07-21: 15 mg via INTRAVENOUS
  Filled 2021-07-21: qty 1

## 2021-07-21 MED ORDER — HYDROMORPHONE HCL 1 MG/ML IJ SOLN: 0.2500 mg | INTRAMUSCULAR | Status: DC | PRN

## 2021-07-21 MED ORDER — HYDROMORPHONE HCL 1 MG/ML IJ SOLN
0.2500 mg | INTRAMUSCULAR | Status: DC | PRN
  Administered 2021-07-21 – 2021-07-22 (×4): 0.25 mg via INTRAVENOUS
  Filled 2021-07-21 (×4): qty 0.5

## 2021-07-21 MED ORDER — HYDROMORPHONE HCL 4 MG PO TABS
4.0000 mg | ORAL_TABLET | ORAL | Status: DC | PRN
  Administered 2021-07-21 – 2021-07-24 (×15): 4 mg via ORAL
  Filled 2021-07-21 (×17): qty 1

## 2021-07-21 NOTE — Progress Notes (Signed)
OT Cancellation Note  Patient Details Name: Sandra [name] MRN: [id number] DOB: Apr 23, 1975   Cancelled Treatment:    Reason Eval/Treat Not Completed: OT screened, no needs identified, will sign off. Pt screened for OT needs, independent in ADL completion and functional mobility with RW. No further OT needs at this time. OT will sign off.   Guadelupe Sabin, OTR/L  [phone] 07/21/2021, 9:22 AM

## 2021-07-21 NOTE — Progress Notes (Signed)
PROGRESS NOTE   Sandra [name]  ZOX:[id number] DOB: 19-Dec-1975 DOA: 07/18/2021 PCP: Coolidge Breeze, FNP   Chief Complaint  Patient presents with   Emesis   Level of care: Med-Surg  Brief Admission History:  46 y.o. female with medical history significant of hypertension, dilated cardiomyopathy, bipolar disorder, CAD, bladder cancer (in remission) who presents to the emergency department due to 1 day onset of nausea and vomiting.  Patient states that she woke up around 2 AM on Thursday (7/6) to use the bathroom after this, she complained of right lower quadrant pain, which was associated with nausea and vomiting, patient states that she has not been able to keep anything down since onset of symptoms, she tried Zofran at home, but due to being unable to keep anything down, she vomited the medication.  She also complained of an associated suprapubic cramping.  She denies any burning sensation on urination or any other irritative bladder symptoms.  Patient denies fever, chills, chest pain.,  She states that she went to [facility] with CT scan without contrast was done and it showed right-sided moderate hydronephrosis with no obvious stone seen, but there was questionable evidence of malignancy.  He presented to the ED here at [facility], but she left prior to being seen due to long wait time.  Patient states that her current symptoms are similar to when she was for diagnosis of bladder cancer and she also complained of recurrent falls and imbalance since onset of symptoms.   Assessment and Plan:  Intractable nausea and vomiting Abdominal pain  Continue IV NS at 100 mLs/Hr Continue IV Dilaudid 0.5 mg q.4h p.r.n. for moderate to severe pain Continue IV Zofran p.r.n. Advance diet further today as tolerated     Diarrhea - 2 episodes today - STOP LINZESS - imodium ordered as needed  - continue fluids and supportive care  Type 2 diabetes mellitus with hyperglycemia Continue ISS and hypoglycemic  protocol  CBG (last 3)  Recent Labs    07/21/21 0711 07/21/21 1122 07/21/21 1615  GLUCAP 115* 143* 171*    Familial hypercholesterolemia Continue Repatha, Crestor, Tricor  GERD Continue Protonix   Chronic Constipation Stop Linzess due to loose stools (diarrhea)  CAD Continue aspirin, nitroglycerin and Crestor   Recurrent falls and imbalance Patient states that she has felt imbalance with falls without injury since onset of symptoms Continue fall precaution and neurochecks Consult PT/OT eval and treat -- recommending HHPT and rolling walker   Right-sided hydroureteronephrosis by CT abdomen and pelvis Urologist was consulted - planning to discuss with Dr. Jeffie Pollock if inpatient procedure needed vs outpatient follow up  Dr Alyson Ingles saw patient today and has a new plan for pain management and if not able to control pain with orals would proceed with stent placement later in the week.   Questionable bladder versus ureteral malignancy per CT abdomen and pelvis Urologist was consulted by ED and reportedly will consult. Pt to follow up with Dr. Jeffie Pollock outpatient her urologist    Obesity class 1 (BMI 34.34 kg/m) Continue diet and lifestyle modification  DVT prophylaxis: enoxaparin Code Status: full  Family Communication:  Disposition: Status is: Inpatient Remains inpatient appropriate because: intensity, IV medications required   Consultants:  Urology (Dr. Alinda Money) Procedures:   Antimicrobials:    Subjective: Pt continues to report ongoing bladder pain, she is tolerating diet better today.    Objective: Vitals:   07/20/21 1413 07/20/21 1727 07/20/21 2127 07/21/21 1221  BP: 136/79 (!) 151/74 136/63 Marland Kitchen)  163/91  Pulse: (!) 59 (!) 57 63 66  Resp: '16 18 16 18  '$ Temp: 98.1 F (36.7 C) 98.2 F (36.8 C) 98.6 F (37 C) 98.8 F (37.1 C)  TempSrc: Oral Oral Oral   SpO2: 98% 98% 99% 100%  Weight:      Height:        Intake/Output Summary (Last 24 hours) at 07/21/2021  1907 Last data filed at 07/21/2021 1830 Gross per 24 hour  Intake 4513.82 ml  Output --  Net 4513.82 ml   Filed Weights   07/18/21 1306  Weight: 77.1 kg   Examination:  General exam: Appears calm and comfortable  Respiratory system: Clear to auscultation. Respiratory effort normal. Cardiovascular system: normal S1 & S2 heard. No JVD, murmurs, rubs, gallops or clicks. No pedal edema. Gastrointestinal system: Abdomen is nondistended, soft and nontender. No organomegaly or masses felt. Normal bowel sounds heard. Central nervous system: Alert and oriented. No focal neurological deficits. Extremities: Symmetric 5 x 5 power. Skin: No rashes, lesions or ulcers. Psychiatry: Judgement and insight appear normal. Mood & affect appropriate.   Data Reviewed: I have personally reviewed following labs and imaging studies  CBC: Recent Labs  Lab 07/17/21 2019 07/18/21 1317 07/19/21 0426 07/21/21 0516  WBC 15.7* 9.0 7.3 6.7  NEUTROABS  --  4.8  --   --   HGB 13.8 13.4 12.5 11.5*  HCT 43.7 42.2 40.6 37.2  MCV 80.2 81.0 83.5 82.9  PLT 414* 371 324 102    Basic Metabolic Panel: Recent Labs  Lab 07/17/21 2019 07/18/21 1317 07/19/21 0426 07/20/21 0451 07/21/21 0516  NA 136 136 139 139 139  K 4.2 4.0 3.9 4.0 4.0  CL 103 102 107 110 111  CO2 '24 25 25 24 25  '$ GLUCOSE 155* 152* 127* 144* 125*  BUN '16 17 13 11 10  '$ CREATININE 0.90 0.86 0.84 0.73 0.82  CALCIUM 9.5 9.2 8.9 8.4* 8.7*  MG  --   --  2.1  --  1.9  PHOS  --   --  4.0  --   --     CBG: Recent Labs  Lab 07/20/21 1618 07/20/21 2127 07/21/21 0711 07/21/21 1122 07/21/21 1615  GLUCAP 122* 286* 115* 143* 171*    No results found for this or any previous visit (from the past 240 hour(s)).   Radiology Studies: US RENAL  Result Date: 07/21/2021 CLINICAL DATA:  Flank pain for 4 days. EXAM: RENAL / URINARY TRACT ULTRASOUND COMPLETE COMPARISON:  CT abdomen pelvis 07/18/2021. FINDINGS: Right Kidney: Renal measurements: 9.8 x  4.3 x 4.0 cm = volume: 89.5 mL. Parenchymal echogenicity within normal limits. Moderate right hydronephrosis. Ureter cannot be followed. No right ureteral jet. Left Kidney: Renal measurements: 11.3 x 5.1 x 5.2 cm = volume: 157.5 mL. Echogenicity within normal limits. No mass or hydronephrosis visualized. Bladder: Again, right ureteral jet is not visualized. Other: None. IMPRESSION: Right hydronephrosis, as on 07/18/2021, with nonvisualization of the right ureteral jet. An obstructing lesion cannot be excluded. Hematuria protocol CT without and with contrast may be helpful in further evaluation, as clinically indicated. Electronically Signed   By: Lorin Picket M.D.   On: 07/21/2021 14:39    Scheduled Meds:  aspirin EC  81 mg Oral Daily   enoxaparin (LOVENOX) injection  40 mg Subcutaneous Q24H   fenofibrate  160 mg Oral Daily   insulin aspart  0-5 Units Subcutaneous QHS   insulin aspart  0-9 Units Subcutaneous TID WC   ketorolac  15 mg Intravenous Q8H   pantoprazole  40 mg Oral BID AC   rosuvastatin  40 mg Oral Daily   Continuous Infusions:  sodium chloride 50 mL/hr at 07/21/21 1822   promethazine (PHENERGAN) injection (IM or IVPB) 25 mg (07/21/21 0832)    LOS: 3 days   Time spent: 35 mins  Leira Regino Wynetta Emery, MD How to contact the [facility] Attending or Consulting provider [place] or covering provider during after hours [place], for this patient?  Check the care team in [facility] and look for a) attending/consulting TRH provider listed and b) the [facility] team listed Log into [email or web address] and use [place]'s universal password to access. If you do not have the password, please contact the hospital operator. Locate the [facility] provider you are looking for under [facility] and page to a number that you can be directly reached. If you still have difficulty reaching the provider, please page the [facility] (Director on Call) for the Hospitalists listed on amion for assistance.  07/21/2021, 7:07 PM

## 2021-07-21 NOTE — H&P (View-Only) (Signed)
  Subjective: Patient reports intermittent right flank pain. Renal US shows mild right hydronephrosis. She is afebrile. UA showed no bacteria. She is currently managed with IV dilaudid  Objective: Vital signs in last 24 hours: Temp:  [98.2 F (36.8 C)-98.8 F (37.1 C)] 98.8 F (37.1 C) (07/10 1221) Pulse Rate:  [57-66] 66 (07/10 1221) Resp:  [16-18] 18 (07/10 1221) BP: (136-163)/(63-91) 163/91 (07/10 1221) SpO2:  [98 %-100 %] 100 % (07/10 1221)  Intake/Output from previous day: 07/09 0701 - 07/10 0700 In: 4028.1 [P.O.:1480; I.V.:2392.7; IV Piggyback:155.3] Out: -  Intake/Output this shift: Total I/O In: 480 [P.O.:480] Out: -   Physical Exam:  General:alert, cooperative, and appears stated age GI: not done and soft, non tender, normal bowel sounds, no palpable masses, no organomegaly, no inguinal hernia Female genitalia: not done Extremities: extremities normal, atraumatic, no cyanosis or edema  Lab Results: Recent Labs    07/19/21 0426 07/21/21 0516  HGB 12.5 11.5*  HCT 40.6 37.2   BMET Recent Labs    07/20/21 0451 07/21/21 0516  NA 139 139  K 4.0 4.0  CL 110 111  CO2 24 25  GLUCOSE 144* 125*  BUN 11 10  CREATININE 0.73 0.82  CALCIUM 8.4* 8.7*   No results for input(s): "LABPT", "INR" in the last 72 hours. No results for input(s): "LABURIN" in the last 72 hours. Results for orders placed or performed in visit on 06/18/21  Microscopic Examination     Status: None   Collection Time: 06/18/21 11:25 AM   Urine  Result Value Ref Range Status   WBC, UA 0-5 0 - 5 /hpf Final   RBC, Urine None seen 0 - 2 /hpf Final   Epithelial Cells (non renal) 0-10 0 - 10 /hpf Final   Renal Epithel, UA None seen None seen /hpf Final   Mucus, UA Present Not Estab. Final   Bacteria, UA Few None seen/Few Final    Studies/Results: US RENAL  Result Date: 07/21/2021 CLINICAL DATA:  Flank pain for 4 days. EXAM: RENAL / URINARY TRACT ULTRASOUND COMPLETE COMPARISON:  CT abdomen  pelvis 07/18/2021. FINDINGS: Right Kidney: Renal measurements: 9.8 x 4.3 x 4.0 cm = volume: 89.5 mL. Parenchymal echogenicity within normal limits. Moderate right hydronephrosis. Ureter cannot be followed. No right ureteral jet. Left Kidney: Renal measurements: 11.3 x 5.1 x 5.2 cm = volume: 157.5 mL. Echogenicity within normal limits. No mass or hydronephrosis visualized. Bladder: Again, right ureteral jet is not visualized. Other: None. IMPRESSION: Right hydronephrosis, as on 07/18/2021, with nonvisualization of the right ureteral jet. An obstructing lesion cannot be excluded. Hematuria protocol CT without and with contrast may be helpful in further evaluation, as clinically indicated. Electronically Signed   By: Lorin Picket M.D.   On: 07/21/2021 14:39    Assessment/Plan: 46yo with right hydronephrosis I discussed the management of hydronephrosis with the patient including ureteral stent placement and nephrostomy tube placement. We will trial toradol and PO dilaudid and if this fails to control her pain we will proceed with right ureteral stent placement Wednesday/thursday   LOS: 3 days   [name] 07/21/2021, 2:51 PM

## 2021-07-21 NOTE — TOC Initial Note (Signed)
Transition of Care [facility]) - Initial/Assessment Note    Patient Details  Name: Sandra [name] MRN: [id number] Date of Birth: 04-Sep-1975  [facility]) CM/SW Contact:    Shade Flood, LCSW Phone Number: 07/21/2021, 3:26 PM  Clinical Narrative:                  Pt admitted from home. She has a high readmission risk score. Spoke with pt today to assess and review dc planning. Pt plans to return home at dc. She states she is independent in ADLs at home. Pt is able to get to appointments and obtain medications.  PT recommending HH PT at dc. Discussed with pt and explained that a [facility] agency that can accept Medicaid is not able to be found. Offered outpatient PT and pt said she would think about it.  TOC will follow and arrange outpt PT if pt decides she is interested.  Expected Discharge Plan: Home/Self Care Barriers to Discharge: Continued Medical Work up   Patient Goals and CMS Choice Patient states their goals for this hospitalization and ongoing recovery are:: go home CMS Medicare.gov Compare Post Acute Care list provided to:: Patient    Expected Discharge Plan and Services Expected Discharge Plan: Home/Self Care In-house Referral: Clinical Social Work     Living arrangements for the past 2 months: Single Family Home                 DME Arranged: Walker rolling DME Agency: [employer] Date DME Agency Contacted: 07/20/21                Prior Living Arrangements/Services Living arrangements for the past 2 months: Single Family Home Lives with:: Spouse Patient language and need for interpreter reviewed:: Yes Do you feel safe going back to the place where you live?: Yes      Need for Family Participation in Patient Care: No (Comment)     Criminal Activity/Legal Involvement Pertinent to Current Situation/Hospitalization: No - Comment as needed  Activities of Daily Living Home Assistive Devices/Equipment: None ADL Screening (condition at time of  admission) Patient's cognitive ability adequate to safely complete daily activities?: Yes Is the patient deaf or have difficulty hearing?: No Does the patient have difficulty seeing, even when wearing glasses/contacts?: No Does the patient have difficulty concentrating, remembering, or making decisions?: Yes Patient able to express need for assistance with ADLs?: Yes Does the patient have difficulty dressing or bathing?: No Independently performs ADLs?: Yes (appropriate for developmental age) Does the patient have difficulty walking or climbing stairs?: No Weakness of Legs: None Weakness of Arms/Hands: Both (Patient states hands)  Permission Sought/Granted Permission sought to share information with : Facility Art therapist granted to share information with : Yes, Verbal Permission Granted     Permission granted to share info w AGENCY: DME        Emotional Assessment   Attitude/Demeanor/Rapport: Engaged Affect (typically observed): Pleasant Orientation: : Oriented to Self, Oriented to Place, Oriented to  Time, Oriented to Situation Alcohol / Substance Use: Not Applicable Psych Involvement: No (comment)  Admission diagnosis:  Intractable nausea and vomiting [R11.2] Patient Active Problem List   Diagnosis Date Noted   Intractable nausea and vomiting 07/18/2021   Abdominal pain 07/18/2021   Hydroureteronephrosis 07/18/2021   Recurrent falls 07/18/2021   Obesity (BMI 30-39.9) 07/18/2021   Chronic idiopathic constipation 06/18/2021   Constipation 02/06/2021   Dysphagia 02/06/2021   Hemorrhoids 02/06/2021   Hyperkalemia 12/20/2020   Dilated cardiomyopathy ([place])  12/20/2020   CAD (coronary artery disease) 12/19/2020   Familial hypercholesterolemia 12/19/2020   Rectal bleeding 05/17/2020   Gastroesophageal reflux disease 05/17/2020   Odynophagia 05/17/2020   Essential hypertension, benign 05/15/2019   S/P left rotator cuff repair 04/20/19  04/27/2019    Complete tear of left rotator cuff    Type 2 diabetes mellitus with hyperglycemia ([place]) 10/10/2018   Mixed hyperlipidemia 10/10/2018   Sacroiliac joint pain 02/23/2017   Chronic low back pain 02/09/2017   Somatic dysfunction of left sacroiliac joint 02/09/2017   GAD (generalized anxiety disorder) 12/17/2016   MDD (major depressive disorder), recurrent episode, moderate ([place]) 12/17/2016   Attention deficit hyperactivity disorder (ADHD) 12/17/2016   Postoperative anemia due to acute blood loss 11/04/2015   Bladder cancer ([place]) 10/31/2015   CARPAL TUNNEL SYNDROME 10/02/2009   PCP:  Coolidge Breeze, FNP Pharmacy:   [facility] [place] [address] [place] [address] Phone: [phone] Fax: [phone]     Social Determinants of Health (SDOH) Interventions    Readmission Risk Interventions    07/21/2021    3:24 PM  Readmission Risk Prevention Plan  Transportation Screening Complete  HRI or Home Care Consult Complete  Social Work Consult for [place] Planning/Counseling Complete  Palliative Care Screening Not Applicable  Medication Review Press photographer) Complete

## 2021-07-21 NOTE — Progress Notes (Signed)
  Subjective: Patient reports intermittent right flank pain. Renal US shows mild right hydronephrosis. She is afebrile. UA showed no bacteria. She is currently managed with IV dilaudid  Objective: Vital signs in last 24 hours: Temp:  [98.2 F (36.8 C)-98.8 F (37.1 C)] 98.8 F (37.1 C) (07/10 1221) Pulse Rate:  [57-66] 66 (07/10 1221) Resp:  [16-18] 18 (07/10 1221) BP: (136-163)/(63-91) 163/91 (07/10 1221) SpO2:  [98 %-100 %] 100 % (07/10 1221)  Intake/Output from previous day: 07/09 0701 - 07/10 0700 In: 4028.1 [P.O.:1480; I.V.:2392.7; IV Piggyback:155.3] Out: -  Intake/Output this shift: Total I/O In: 480 [P.O.:480] Out: -   Physical Exam:  General:alert, cooperative, and appears stated age GI: not done and soft, non tender, normal bowel sounds, no palpable masses, no organomegaly, no inguinal hernia Female genitalia: not done Extremities: extremities normal, atraumatic, no cyanosis or edema  Lab Results: Recent Labs    07/19/21 0426 07/21/21 0516  HGB 12.5 11.5*  HCT 40.6 37.2   BMET Recent Labs    07/20/21 0451 07/21/21 0516  NA 139 139  K 4.0 4.0  CL 110 111  CO2 24 25  GLUCOSE 144* 125*  BUN 11 10  CREATININE 0.73 0.82  CALCIUM 8.4* 8.7*   No results for input(s): "LABPT", "INR" in the last 72 hours. No results for input(s): "LABURIN" in the last 72 hours. Results for orders placed or performed in visit on 06/18/21  Microscopic Examination     Status: None   Collection Time: 06/18/21 11:25 AM   Urine  Result Value Ref Range Status   WBC, UA 0-5 0 - 5 /hpf Final   RBC, Urine None seen 0 - 2 /hpf Final   Epithelial Cells (non renal) 0-10 0 - 10 /hpf Final   Renal Epithel, UA None seen None seen /hpf Final   Mucus, UA Present Not Estab. Final   Bacteria, UA Few None seen/Few Final    Studies/Results: US RENAL  Result Date: 07/21/2021 CLINICAL DATA:  Flank pain for 4 days. EXAM: RENAL / URINARY TRACT ULTRASOUND COMPLETE COMPARISON:  CT abdomen  pelvis 07/18/2021. FINDINGS: Right Kidney: Renal measurements: 9.8 x 4.3 x 4.0 cm = volume: 89.5 mL. Parenchymal echogenicity within normal limits. Moderate right hydronephrosis. Ureter cannot be followed. No right ureteral jet. Left Kidney: Renal measurements: 11.3 x 5.1 x 5.2 cm = volume: 157.5 mL. Echogenicity within normal limits. No mass or hydronephrosis visualized. Bladder: Again, right ureteral jet is not visualized. Other: None. IMPRESSION: Right hydronephrosis, as on 07/18/2021, with nonvisualization of the right ureteral jet. An obstructing lesion cannot be excluded. Hematuria protocol CT without and with contrast may be helpful in further evaluation, as clinically indicated. Electronically Signed   By: Lorin Picket M.D.   On: 07/21/2021 14:39    Assessment/Plan: 46yo with right hydronephrosis I discussed the management of hydronephrosis with the patient including ureteral stent placement and nephrostomy tube placement. We will trial toradol and PO dilaudid and if this fails to control her pain we will proceed with right ureteral stent placement Wednesday/thursday   LOS: 3 days   [name] 07/21/2021, 2:51 PM

## 2021-07-22 DIAGNOSIS — I2510 Atherosclerotic heart disease of native coronary artery without angina pectoris: Secondary | ICD-10-CM | POA: Diagnosis not present

## 2021-07-22 DIAGNOSIS — R112 Nausea with vomiting, unspecified: Secondary | ICD-10-CM | POA: Diagnosis not present

## 2021-07-22 DIAGNOSIS — R1031 Right lower quadrant pain: Secondary | ICD-10-CM | POA: Diagnosis not present

## 2021-07-22 DIAGNOSIS — K5900 Constipation, unspecified: Secondary | ICD-10-CM | POA: Diagnosis not present

## 2021-07-22 LAB — GLUCOSE, CAPILLARY
Glucose-Capillary: 152 mg/dL — ABNORMAL HIGH (ref 70–99)
Glucose-Capillary: 215 mg/dL — ABNORMAL HIGH (ref 70–99)
Glucose-Capillary: 192 mg/dL — ABNORMAL HIGH (ref 70–99)
Glucose-Capillary: 154 mg/dL — ABNORMAL HIGH (ref 70–99)

## 2021-07-22 MED ORDER — ORAL CARE MOUTH RINSE: 15.0000 mL | OROMUCOSAL | Status: DC | PRN

## 2021-07-22 MED ORDER — PROMETHAZINE HCL 25 MG/ML IJ SOLN
INTRAMUSCULAR | Status: AC
  Filled 2021-07-22: qty 1

## 2021-07-22 MED ORDER — HYDROXYZINE HCL 25 MG PO TABS
25.0000 mg | ORAL_TABLET | Freq: Three times a day (TID) | ORAL | Status: DC | PRN
  Administered 2021-07-23: 25 mg via ORAL
  Filled 2021-07-22: qty 1

## 2021-07-22 MED ORDER — POLYETHYLENE GLYCOL 3350 17 G PO PACK
17.0000 g | PACK | Freq: Every day | ORAL | Status: DC
  Administered 2021-07-22 – 2021-07-24 (×3): 17 g via ORAL
  Filled 2021-07-22 (×3): qty 1

## 2021-07-22 NOTE — Progress Notes (Addendum)
Physical Therapy Treatment/Discharge Summary Patient Details Name: Sandra [name] MRN: [id number] DOB: 10/03/1975 Today's Date: 07/22/2021   History of Present Illness Sandra [name] is a 46 y.o. female with medical history significant of hypertension, dilated cardiomyopathy, bipolar disorder, CAD, bladder cancer (in remission) who presents to the emergency department due to 1 day onset of nausea and vomiting.  Patient states that she woke up around 2 AM on Thursday (7/6) to use the bathroom after this, she complained of right lower quadrant pain, which was associated with nausea and vomiting, patient states that she has not been able to keep anything down since onset of symptoms, she tried Zofran at home, but due to being unable to keep anything down, she vomited the medication.  She also complained of an associated suprapubic cramping.  She denies any burning sensation on urination or any other irritative bladder symptoms.  Patient denies fever, chills, chest pain.,  She states that she went to [facility] with CT scan without contrast was done and it showed right-sided moderate hydronephrosis with no obvious stone seen, but there was questionable evidence of malignancy.  He presented to the ED here at [facility], but she left prior to being seen due to long wait time.  Patient states that her current symptoms are similar to when she was for diagnosis of bladder cancer and she also complained of recurrent falls and imbalance since onset of symptoms.    PT Comments    Patient sitting upright in bed upon therapist arrival and agreeable to participating in therapy session. Patient reported she has been using IV pole to ambulate to bathroom independently in room. Patient performs bed mobility and transfers independently and steady. Patient was able to navigate using a wide base IV pole for ambulation for greater than 250 feet with minimal demonstration of unsteadiness. Patient continues to report frequent  falls recently. Patient may benefit from a [facility] to improve her steadiness in and around her home. Patient may also benefit from OPPT to challenge her balance and obstacle navigation skills, therefore reducing her risk for falls. Patient would benefit from continued ambulation with nursing supervision for safety. Patient evaluated by Physical Therapy with no further acute PT needs identified. All education has been completed and the patient has no further questions.  See below for any follow-up Physical Therapy or equipment needs. PT is signing off. Thank you for this referral.    Recommendations for follow up therapy are one component of a multi-disciplinary discharge planning process, led by the attending physician.  Recommendations may be updated based on patient status, additional functional criteria and insurance authorization.  Follow Up Recommendations  Outpatient PT     Assistance Recommended at Discharge PRN  Patient can return home with the following Assistance with cooking/housework   Equipment Recommendations  Cane    Recommendations for Other Services       Precautions / Restrictions Precautions Precautions: Fall Restrictions Weight Bearing Restrictions: No     Mobility  Bed Mobility Overal bed mobility: Independent    Transfers Overall transfer level: Independent Equipment used: None   General transfer comment: slightly unsteady upon standing initially    Ambulation/Gait Ambulation/Gait assistance: Supervision Gait Distance (Feet): 250 Feet Assistive device: None, IV Pole Gait Pattern/deviations: Step-through pattern, Trunk flexed Gait velocity: decreased     General Gait Details: able to ambulate with IV pole with some unsteadiness, gait and confidence improved this date   Civil Service fast streamer  Modified Rankin (Stroke Patients Only)       Balance Overall balance assessment: Needs assistance Sitting-balance support: No upper  extremity supported, Feet supported Sitting balance-Leahy Scale: Good Sitting balance - Comments: seated EOB   Standing balance support: Single extremity supported, During functional activity Standing balance-Leahy Scale: Good Standing balance comment: good with IV pole        Cognition Arousal/Alertness: Awake/alert Behavior During Therapy: WFL for tasks assessed/performed Overall Cognitive Status: Within Functional Limits for tasks assessed        Exercises      General Comments        Pertinent Vitals/Pain Pain Assessment Pain Assessment: 0-10 Pain Score: 7  Pain Location: abdomen Pain Intervention(s): Premedicated before session, Limited activity within patient's tolerance, Monitored during session    Home Living       Prior Function            PT Goals (current goals can now be found in the care plan section) Acute Rehab PT Goals Patient Stated Goal: return home PT Goal Formulation: With patient Time For Goal Achievement: 07/27/21 Potential to Achieve Goals: Good Progress towards PT goals: Progressing toward goals;Goals met/education completed, patient discharged from PT    Frequency    Min 3X/week      PT Plan Current plan remains appropriate;Equipment recommendations need to be updated;Discharge plan needs to be updated       AM-PAC PT "6 Clicks" Mobility   Outcome Measure  Help needed turning from your back to your side while in a flat bed without using bedrails?: None Help needed moving from lying on your back to sitting on the side of a flat bed without using bedrails?: None Help needed moving to and from a bed to a chair (including a wheelchair)?: None Help needed standing up from a chair using your arms (e.g., wheelchair or bedside chair)?: None Help needed to walk in hospital room?: A Little Help needed climbing 3-5 steps with a railing? : A Little 6 Click Score: 22    End of Session   Activity Tolerance: Patient tolerated treatment  well Patient left: in bed;with call bell/phone within reach Nurse Communication: Mobility status PT Visit Diagnosis: Unsteadiness on feet (R26.81);Other abnormalities of gait and mobility (R26.89);Muscle weakness (generalized) (M62.81)     Time: 7628-3151 PT Time Calculation (min) (ACUTE ONLY): 16 min  Charges:  $Therapeutic Activity: 8-22 mins                     Floria Raveling. Hartnett-Rands, MS, PT Per [place] [phone]  Pamala Hurry  Hartnett-Rands 07/22/2021, 1:20 PM

## 2021-07-22 NOTE — Progress Notes (Signed)
PROGRESS NOTE   Sandra [name]  GUR:[id number] DOB: [birth year] DOA: 07/18/2021 PCP: Coolidge Breeze, FNP   Chief Complaint  Patient presents with   Emesis   Level of care: Med-Surg  Brief Admission History:  46 y.o. female with medical history significant of hypertension, dilated cardiomyopathy, bipolar disorder, CAD, bladder cancer (in remission) who presents to the emergency department due to 1 day onset of nausea and vomiting.  Patient states that she woke up around 2 AM on Thursday (7/6) to use the bathroom after this, she complained of right lower quadrant pain, which was associated with nausea and vomiting, patient states that she has not been able to keep anything down since onset of symptoms, she tried Zofran at home, but due to being unable to keep anything down, she vomited the medication.  She also complained of an associated suprapubic cramping.  She denies any burning sensation on urination or any other irritative bladder symptoms.  Patient denies fever, chills, chest pain.,  She states that she went to [facility] with CT scan without contrast was done and it showed right-sided moderate hydronephrosis with no obvious stone seen, but there was questionable evidence of malignancy.  He presented to the ED here at [facility], but she left prior to being seen due to long wait time.  Patient states that her current symptoms are similar to when she was for diagnosis of bladder cancer and she also complained of recurrent falls and imbalance since onset of symptoms.   Assessment and Plan:  Intractable nausea and vomiting - RESOLVING Abdominal pain  Continue IVF but reducing rate Weaning down pain meds per Dr. Alyson Ingles Continue IV Zofran p.r.n. Advance diet further today as tolerated     Diarrhea - RESOLVED - STOPPED LINZESS - imodium ordered as needed  - continue fluids and supportive care  Type 2 diabetes mellitus with hyperglycemia Continue ISS and hypoglycemic protocol  CBG  (last 3)  Recent Labs    07/21/21 2124 07/22/21 0743 07/22/21 1115  GLUCAP 194* 152* 215*    Familial hypercholesterolemia Continue Repatha, Crestor, Tricor  GERD Continue Protonix   Chronic Constipation Stopped Linzess due to loose stools (diarrhea) Try miralax  CAD Continue aspirin, nitroglycerin and Crestor   Recurrent falls and imbalance Patient states that she has felt imbalance with falls without injury since onset of symptoms Continue fall precaution and neurochecks Consult PT/OT eval and treat -- recommending HHPT and rolling walker   Right-sided hydroureteronephrosis by CT abdomen and pelvis Urologist was consulted - planning to discuss with Dr. Jeffie Pollock if inpatient procedure needed vs outpatient follow up  Dr Alyson Ingles saw patient today and has a new plan for pain management and if not able to control pain with orals would proceed with stent placement later in the week.   Questionable bladder versus ureteral malignancy per CT abdomen and pelvis Urologist was consulted and likely will have stent placement Wed/Thu per Dr. Alyson Ingles   Obesity class 1 (BMI 34.34 kg/m) Continue diet and lifestyle modification  DVT prophylaxis: enoxaparin Code Status: full  Family Communication:  Disposition: Status is: Inpatient Remains inpatient appropriate because: intensity, IV medications required   Consultants:  Urology (Dr. Alinda Money) Alyson Ingles) Procedures:   Antimicrobials:    Subjective: Pt reports that toradol caused itching and does not want to take further, wanting to go ahead with stent placement on WedThu with Dr. Alyson Ingles  Objective: Vitals:   07/21/21 1221 07/21/21 2122 07/22/21 0546 07/22/21 1213  BP: (!) 163/91 (!) 157/89 Marland Kitchen)  159/73 (!) 152/81  Pulse: 66 64 62 64  Resp: '18 19 18 18  '$ Temp: 98.8 F (37.1 C) 98.4 F (36.9 C) 97.6 F (36.4 C) 98 F (36.7 C)  TempSrc:  Oral Oral Oral  SpO2: 100% 96% 97% 97%  Weight:      Height:        Intake/Output  Summary (Last 24 hours) at 07/22/2021 1240 Last data filed at 07/22/2021 0900 Gross per 24 hour  Intake 2026.07 ml  Output --  Net 2026.07 ml   Filed Weights   07/18/21 1306  Weight: 77.1 kg   Examination:  General exam: Appears calm and comfortable  Respiratory system: Clear to auscultation. Respiratory effort normal. Cardiovascular system: normal S1 & S2 heard. No JVD, murmurs, rubs, gallops or clicks. No pedal edema. Gastrointestinal system: Abdomen is nondistended, soft and nontender. No organomegaly or masses felt. Normal bowel sounds heard. Central nervous system: Alert and oriented. No focal neurological deficits. Extremities: Symmetric 5 x 5 power. Skin: No rashes, lesions or ulcers. Psychiatry: Judgement and insight appear normal. Mood & affect appropriate.   Data Reviewed: I have personally reviewed following labs and imaging studies  CBC: Recent Labs  Lab 07/17/21 2019 07/18/21 1317 07/19/21 0426 07/21/21 0516  WBC 15.7* 9.0 7.3 6.7  NEUTROABS  --  4.8  --   --   HGB 13.8 13.4 12.5 11.5*  HCT 43.7 42.2 40.6 37.2  MCV 80.2 81.0 83.5 82.9  PLT 414* 371 324 235    Basic Metabolic Panel: Recent Labs  Lab 07/17/21 2019 07/18/21 1317 07/19/21 0426 07/20/21 0451 07/21/21 0516  NA 136 136 139 139 139  K 4.2 4.0 3.9 4.0 4.0  CL 103 102 107 110 111  CO2 '24 25 25 24 25  '$ GLUCOSE 155* 152* 127* 144* 125*  BUN '16 17 13 11 10  '$ CREATININE 0.90 0.86 0.84 0.73 0.82  CALCIUM 9.5 9.2 8.9 8.4* 8.7*  MG  --   --  2.1  --  1.9  PHOS  --   --  4.0  --   --     CBG: Recent Labs  Lab 07/21/21 1122 07/21/21 1615 07/21/21 2124 07/22/21 0743 07/22/21 1115  GLUCAP 143* 171* 194* 152* 215*    No results found for this or any previous visit (from the past 240 hour(s)).   Radiology Studies: US RENAL  Result Date: 07/21/2021 CLINICAL DATA:  Flank pain for 4 days. EXAM: RENAL / URINARY TRACT ULTRASOUND COMPLETE COMPARISON:  CT abdomen pelvis 07/18/2021. FINDINGS:  Right Kidney: Renal measurements: 9.8 x 4.3 x 4.0 cm = volume: 89.5 mL. Parenchymal echogenicity within normal limits. Moderate right hydronephrosis. Ureter cannot be followed. No right ureteral jet. Left Kidney: Renal measurements: 11.3 x 5.1 x 5.2 cm = volume: 157.5 mL. Echogenicity within normal limits. No mass or hydronephrosis visualized. Bladder: Again, right ureteral jet is not visualized. Other: None. IMPRESSION: Right hydronephrosis, as on 07/18/2021, with nonvisualization of the right ureteral jet. An obstructing lesion cannot be excluded. Hematuria protocol CT without and with contrast may be helpful in further evaluation, as clinically indicated. Electronically Signed   By: Lorin Picket M.D.   On: 07/21/2021 14:39    Scheduled Meds:  aspirin EC  81 mg Oral Daily   enoxaparin (LOVENOX) injection  40 mg Subcutaneous Q24H   fenofibrate  160 mg Oral Daily   insulin aspart  0-5 Units Subcutaneous QHS   insulin aspart  0-9 Units Subcutaneous TID WC   pantoprazole  40 mg Oral BID AC   polyethylene glycol  17 g Oral Daily   rosuvastatin  40 mg Oral Daily   Continuous Infusions:  sodium chloride 50 mL/hr at 07/22/21 0644   promethazine (PHENERGAN) injection (IM or IVPB) 25 mg (07/22/21 1236)    LOS: 4 days   Time spent: 72 mins  Areyana Leoni Wynetta Emery, MD How to contact the [facility] Attending or Consulting provider [place] or covering provider during after hours [place], for this patient?  Check the care team in [facility] and look for a) attending/consulting TRH provider listed and b) the [facility] team listed Log into [email or web address] and use [place]'s universal password to access. If you do not have the password, please contact the hospital operator. Locate the [facility] provider you are looking for under [facility] and page to a number that you can be directly reached. If you still have difficulty reaching the provider, please page the [facility] (Director on Call) for the Hospitalists listed on amion for  assistance.  07/22/2021, 12:40 PM

## 2021-07-22 NOTE — Progress Notes (Addendum)
Pt. stated that she thinks she is allergic to Toradol. She is c/o of itching. MD notified. Atarax prescribed for the itching. Pt. States that she is not going to take anymore Toradol. Urologist notified as well via secure chat.

## 2021-07-23 DIAGNOSIS — R112 Nausea with vomiting, unspecified: Secondary | ICD-10-CM | POA: Diagnosis not present

## 2021-07-23 LAB — GLUCOSE, CAPILLARY
Glucose-Capillary: 167 mg/dL — ABNORMAL HIGH (ref 70–99)
Glucose-Capillary: 235 mg/dL — ABNORMAL HIGH (ref 70–99)
Glucose-Capillary: 187 mg/dL — ABNORMAL HIGH (ref 70–99)
Glucose-Capillary: 270 mg/dL — ABNORMAL HIGH (ref 70–99)

## 2021-07-23 MED ORDER — CEFAZOLIN SODIUM-DEXTROSE 2-4 GM/100ML-% IV SOLN
2.0000 g | INTRAVENOUS | Status: AC
Start: 2021-07-24 — End: 2021-07-24
  Administered 2021-07-24: 2 g via INTRAVENOUS
  Filled 2021-07-23: qty 100

## 2021-07-23 MED ORDER — FENTANYL CITRATE PF 50 MCG/ML IJ SOSY
50.0000 ug | PREFILLED_SYRINGE | INTRAMUSCULAR | Status: DC | PRN
  Administered 2021-07-23 – 2021-07-24 (×5): 50 ug via INTRAVENOUS
  Filled 2021-07-23 (×5): qty 1

## 2021-07-23 NOTE — TOC Progression Note (Signed)
Transition of Care [facility]) - Progression Note    Patient Details  Name: Sandra [name] MRN: [id number] Date of Birth: [birth year]  Transition of Care [facility]) CM/SW Contact  Salome Arnt, [place] Phone Number: 07/23/2021, 11:03 AM  Clinical Narrative:  LCSW followed up with pt about outpatient PT. Pt states she is having stents placed tomorrow and requests that TOC check in after that. TOC will continue to follow.      Expected Discharge Plan: Home/Self Care Barriers to Discharge: Continued Medical Work up  Expected Discharge Plan and Services Expected Discharge Plan: Home/Self Care In-house Referral: Clinical Social Work     Living arrangements for the past 2 months: Single Family Home                 DME Arranged: Walker rolling DME Agency: [employer] Date DME Agency Contacted: 07/20/21                 Social Determinants of Health (SDOH) Interventions    Readmission Risk Interventions    07/21/2021    3:24 PM  Readmission Risk Prevention Plan  Transportation Screening Complete  HRI or [place] Complete  Social Work Consult for [place] Planning/Counseling Complete  Palliative Care Screening Not Applicable  Medication Review Press photographer) Complete

## 2021-07-23 NOTE — Progress Notes (Signed)
PROGRESS NOTE   Sandra [name]  GGY:[id number] DOB: 1975/07/03 DOA: 07/18/2021 PCP: Coolidge Breeze, FNP   Chief Complaint  Patient presents with   Emesis   Level of care: Med-Surg  Brief Admission History:  46 y.o. female with medical history significant of hypertension, dilated cardiomyopathy, bipolar disorder, CAD, bladder cancer (in remission) who presents to the emergency department due to 1 day onset of nausea and vomiting.  Patient states that she woke up around 2 AM on Thursday (7/6) to use the bathroom after this, she complained of right lower quadrant pain, which was associated with nausea and vomiting, patient states that she has not been able to keep anything down since onset of symptoms, she tried Zofran at home, but due to being unable to keep anything down, she vomited the medication.  She also complained of an associated suprapubic cramping.  She denies any burning sensation on urination or any other irritative bladder symptoms.  Patient denies fever, chills, chest pain.,  She states that she went to [facility] with [facility] scan without contrast was done and it showed right-sided moderate hydronephrosis with no obvious stone seen, but there was questionable evidence of malignancy.  He presented to the ED here at [facility], but she left prior to being seen due to long wait time.  Patient states that her current symptoms are similar to when she was for diagnosis of bladder cancer and she also complained of recurrent falls and imbalance since onset of symptoms.   Assessment and Plan: 1) Right-sided hydroureteronephrosis by CT abdomen and pelvis -Discussed with urologist Dr Alyson Ingles on 07/23/21--plan is for surgical procedure with stent placement on 07/24/2021 ---No further emesis, some nausea persist  -Abdominal pain persist despite p.o. Dilaudid  -we will add IV fentanyl as needed Continue IV Zofran p.r.n. -Continue IV fluids   2)IBS-C-- -Diarrhea - RESOLVED - STOPPED LINZESS -  imodium ordered as needed  - continue fluids and supportive care  3)Type 2 diabetes mellitus with hyperglycemia -A1c is 8.1 reflecting uncontrolled diabetes with hyperglycemia PTA Use Novolog/Humalog Sliding scale insulin with Accu-Cheks/Fingersticks as ordered   4)Familial hypercholesterolemia with Xanthelesma Continue Repatha, Crestor, Tricor  5)CAD--no chest pains or ACS symptoms at this time Continue aspirin, nitroglycerin and Crestor  6)Tobacco Abuse--- smoking cessation strongly advised especially due to #3, #4  and # 5 above  7)GERD Continue Protonix   8)Recurrent falls and imbalance Patient states that she has felt imbalance with falls without injury since onset of symptoms Continue fall precaution and neurochecks Consult PT/OT eval and treat -- recommending HHPT and rolling walker -Be judicious with opiates  9)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   DVT prophylaxis: enoxaparin Code Status: full  Family Communication: none at bedside  Disposition: Status is: Inpatient Remains inpatient appropriate because: intensity, IV medications required   Consultants:  Urology (Dr. Alinda Money) Alyson Ingles) Procedures:   Antimicrobials:    Subjective: C/o increased pain despite oral diluadid Seen with RN Sarah at bedside  No fever  Or chills  No Nausea, Vomiting or Diarrhea   Objective: Vitals:   07/22/21 1213 07/22/21 2244 07/23/21 0553 07/23/21 1248  BP: (!) 152/81 (!) 150/75 (!) 197/80 (!) 161/73  Pulse: 64 70 61 60  Resp: '18 20 14 18  '$ Temp: 98 F (36.7 C) 98.3 F (  36.8 C) 98.2 F (36.8 C) 98.6 F (37 C)  TempSrc: Oral Oral Oral Oral  SpO2: 97% 96% 98% 94%  Weight:      Height:        Intake/Output Summary (Last 24 hours) at  07/23/2021 1338 Last data filed at 07/23/2021 1253 Gross per 24 hour  Intake 2607.42 ml  Output --  Net 2607.42 ml   Filed Weights   07/18/21 1306  Weight: 77.1 kg   Examination:  Physical Exam  Gen:- Awake Alert, in no acute distress , obese appearing HEENT:- [place].AT, No sclera icterus Neck-Supple Neck,No JVD,.  Lungs-  CTAB , fair air movement bilaterally  CV- S1, S2 normal, RRR Abd-  +ve B.Sounds, Abd Soft, +ve right lower quadrant tenderness, +ve right CVA area tenderness, increased truncal adiposity Extremity/Skin:- No  edema,   good pedal pulses  Psych-affect is appropriate, oriented x3 Neuro-no new focal deficits, no tremors  Data Reviewed: I have personally reviewed following labs and imaging studies  CBC: Recent Labs  Lab 07/17/21 2019 07/18/21 1317 07/19/21 0426 07/21/21 0516  WBC 15.7* 9.0 7.3 6.7  NEUTROABS  --  4.8  --   --   HGB 13.8 13.4 12.5 11.5*  HCT 43.7 42.2 40.6 37.2  MCV 80.2 81.0 83.5 82.9  PLT 414* 371 324 536    Basic Metabolic Panel: Recent Labs  Lab 07/17/21 2019 07/18/21 1317 07/19/21 0426 07/20/21 0451 07/21/21 0516  NA 136 136 139 139 139  K 4.2 4.0 3.9 4.0 4.0  CL 103 102 107 110 111  CO2 '24 25 25 24 25  '$ GLUCOSE 155* 152* 127* 144* 125*  BUN '16 17 13 11 10  '$ CREATININE 0.90 0.86 0.84 0.73 0.82  CALCIUM 9.5 9.2 8.9 8.4* 8.7*  MG  --   --  2.1  --  1.9  PHOS  --   --  4.0  --   --     CBG: Recent Labs  Lab 07/22/21 1115 07/22/21 1621 07/22/21 2246 07/23/21 0731 07/23/21 1123  GLUCAP 215* 192* 154* 167* 235*    No results found for this or any previous visit (from the past 240 hour(s)).   Radiology Studies: US RENAL  Result Date: 07/21/2021 CLINICAL DATA:  Flank pain for 4 days. EXAM: RENAL / URINARY TRACT ULTRASOUND COMPLETE COMPARISON:  CT abdomen pelvis 07/18/2021. FINDINGS: Right Kidney: Renal measurements: 9.8 x 4.3 x 4.0 cm = volume: 89.5 mL. Parenchymal echogenicity within normal limits. Moderate right  hydronephrosis. Ureter cannot be followed. No right ureteral jet. Left Kidney: Renal measurements: 11.3 x 5.1 x 5.2 cm = volume: 157.5 mL. Echogenicity within normal limits. No mass or hydronephrosis visualized. Bladder: Again, right ureteral jet is not visualized. Other: None. IMPRESSION: Right hydronephrosis, as on 07/18/2021, with nonvisualization of the right ureteral jet. An obstructing lesion cannot be excluded. Hematuria protocol CT without and with contrast may be helpful in further evaluation, as clinically indicated. Electronically Signed   By: Lorin Picket M.D.   On: 07/21/2021 14:39    Scheduled Meds:  aspirin EC  81 mg Oral Daily   enoxaparin (LOVENOX) injection  40 mg Subcutaneous Q24H   fenofibrate  160 mg Oral Daily   insulin aspart  0-5 Units Subcutaneous QHS   insulin aspart  0-9 Units Subcutaneous TID WC   pantoprazole  40 mg Oral BID AC   polyethylene glycol  17 g Oral Daily   rosuvastatin  40 mg Oral Daily   Continuous Infusions:  sodium  chloride 35 mL/hr at 07/23/21 0846   [START ON 07/24/2021]  ceFAZolin (ANCEF) IV     promethazine (PHENERGAN) injection (IM or IVPB) 25 mg (07/23/21 1030)    LOS: 5 days   Roxan Hockey, MD How to contact the [facility] Attending or Consulting provider [place] or covering provider during after hours [place], for this patient?  Check the care team in [facility] and look for a) attending/consulting [facility] provider listed and b) the [facility] team listed Log into [email or web address] and use [facility] universal password to access. If you do not have the password, please contact the hospital operator. Locate the [facility] provider you are looking for under [facility] and page to a number that you can be directly reached. If you still have difficulty reaching the provider, please page the [facility] (Director on Call) for the Hospitalists listed on amion for assistance.  07/23/2021, 1:38 PM

## 2021-07-23 NOTE — Progress Notes (Signed)
Patient had requested pain medication of PO dilaudid '4mg'$ . When I scanned the medication I got a warning that it was being given too soon since her IV Dilaudid 0.'25mg'$ . She had been getting the IV and PO dilaudid every 2 hours alternating between the two. I called pharmacy in [place] to ask them how this medication was to be given. He stated that one of them should be discontinued. Hospitalist tonight Dc'd the IV dilaudid 0.'25mg'$  and kept the dilaudid '4mg'$  PO.

## 2021-07-24 ENCOUNTER — Encounter (HOSPITAL_COMMUNITY): Payer: Self-pay | Admitting: Internal Medicine

## 2021-07-24 ENCOUNTER — Inpatient Hospital Stay (HOSPITAL_COMMUNITY): Payer: Medicaid Other | Admitting: Certified Registered"

## 2021-07-24 ENCOUNTER — Encounter (HOSPITAL_COMMUNITY)
Admission: EM | Disposition: A | Discharge status: Home / Self Care | Payer: Self-pay | Source: Home / Self Care | Attending: Family Medicine

## 2021-07-24 ENCOUNTER — Inpatient Hospital Stay (HOSPITAL_COMMUNITY): Payer: Medicaid Other

## 2021-07-24 DIAGNOSIS — N131 Hydronephrosis with ureteral stricture, not elsewhere classified: Secondary | ICD-10-CM

## 2021-07-24 DIAGNOSIS — N135 Crossing vessel and stricture of ureter without hydronephrosis: Secondary | ICD-10-CM | POA: Diagnosis not present

## 2021-07-24 DIAGNOSIS — Z87891 Personal history of nicotine dependence: Secondary | ICD-10-CM | POA: Diagnosis not present

## 2021-07-24 DIAGNOSIS — I10 Essential (primary) hypertension: Secondary | ICD-10-CM

## 2021-07-24 DIAGNOSIS — I25119 Atherosclerotic heart disease of native coronary artery with unspecified angina pectoris: Secondary | ICD-10-CM | POA: Diagnosis not present

## 2021-07-24 HISTORY — PX: CYSTOSCOPY W/ URETERAL STENT PLACEMENT: SHX1429

## 2021-07-24 LAB — GLUCOSE, CAPILLARY
Glucose-Capillary: 165 mg/dL — ABNORMAL HIGH (ref 70–99)
Glucose-Capillary: 162 mg/dL — ABNORMAL HIGH (ref 70–99)
Glucose-Capillary: 251 mg/dL — ABNORMAL HIGH (ref 70–99)

## 2021-07-24 SURGERY — CYSTOSCOPY, WITH RETROGRADE PYELOGRAM AND URETERAL STENT INSERTION
Anesthesia: General | Site: Ureter | Laterality: Right

## 2021-07-24 MED ORDER — DIATRIZOATE MEGLUMINE 30 % UR SOLN
URETHRAL | Status: DC | PRN
  Administered 2021-07-24: 10 mL via URETHRAL

## 2021-07-24 MED ORDER — HYDROXYZINE HCL 25 MG PO TABS
25.0000 mg | ORAL_TABLET | Freq: Three times a day (TID) | ORAL | 0 refills | Status: DC | PRN
Start: 2021-07-24 — End: 2022-03-06

## 2021-07-24 MED ORDER — HYDROCODONE-ACETAMINOPHEN 5-325 MG PO TABS
1.0000 | ORAL_TABLET | Freq: Four times a day (QID) | ORAL | 0 refills | Status: DC | PRN
Start: 2021-07-24 — End: 2022-02-05

## 2021-07-24 MED ORDER — DIATRIZOATE MEGLUMINE 30 % UR SOLN
URETHRAL | Status: AC
  Filled 2021-07-24: qty 100

## 2021-07-24 MED ORDER — ONDANSETRON HCL 4 MG/2ML IJ SOLN
INTRAMUSCULAR | Status: AC
  Filled 2021-07-24: qty 2

## 2021-07-24 MED ORDER — LIDOCAINE HCL (PF) 2 % IJ SOLN
INTRAMUSCULAR | Status: AC
  Filled 2021-07-24: qty 5

## 2021-07-24 MED ORDER — NICOTINE 21 MG/24HR TD PT24: 21.0000 mg | MEDICATED_PATCH | TRANSDERMAL | 0 refills | Status: AC

## 2021-07-24 MED ORDER — FENTANYL CITRATE (PF) 100 MCG/2ML IJ SOLN
INTRAMUSCULAR | Status: DC | PRN
Start: 2021-07-24 — End: 2021-07-24
  Administered 2021-07-24: 50 ug via INTRAVENOUS

## 2021-07-24 MED ORDER — ONDANSETRON HCL 4 MG/2ML IJ SOLN
INTRAMUSCULAR | Status: DC | PRN
  Administered 2021-07-24: 4 mg via INTRAVENOUS

## 2021-07-24 MED ORDER — PROPOFOL 10 MG/ML IV BOLUS
INTRAVENOUS | Status: AC
  Filled 2021-07-24: qty 20

## 2021-07-24 MED ORDER — PROPOFOL 10 MG/ML IV BOLUS
INTRAVENOUS | Status: DC | PRN
  Administered 2021-07-24: 150 mg via INTRAVENOUS

## 2021-07-24 MED ORDER — FENTANYL CITRATE PF 50 MCG/ML IJ SOSY: 25.0000 ug | PREFILLED_SYRINGE | INTRAMUSCULAR | Status: DC | PRN

## 2021-07-24 MED ORDER — HYDROCODONE-ACETAMINOPHEN 7.5-325 MG PO TABS: 1.0000 | ORAL_TABLET | Freq: Once | ORAL | Status: DC | PRN

## 2021-07-24 MED ORDER — STERILE WATER FOR IRRIGATION IR SOLN
Status: DC | PRN
  Administered 2021-07-24: 500 mL

## 2021-07-24 MED ORDER — LACTATED RINGERS IV SOLN: INTRAVENOUS | Status: DC

## 2021-07-24 MED ORDER — DEXAMETHASONE SODIUM PHOSPHATE 10 MG/ML IJ SOLN
INTRAMUSCULAR | Status: AC
  Filled 2021-07-24: qty 1

## 2021-07-24 MED ORDER — LIDOCAINE 2% (20 MG/ML) 5 ML SYRINGE
INTRAMUSCULAR | Status: DC | PRN
  Administered 2021-07-24: 100 mg via INTRAVENOUS

## 2021-07-24 MED ORDER — MIDAZOLAM HCL 2 MG/2ML IJ SOLN
INTRAMUSCULAR | Status: AC
  Filled 2021-07-24: qty 2

## 2021-07-24 MED ORDER — ASPIRIN EC 81 MG PO TBEC: 81.0000 mg | DELAYED_RELEASE_TABLET | Freq: Every day | ORAL | 11 refills | Status: AC

## 2021-07-24 MED ORDER — MIDAZOLAM HCL 5 MG/5ML IJ SOLN
INTRAMUSCULAR | Status: DC | PRN
  Administered 2021-07-24: 2 mg via INTRAVENOUS

## 2021-07-24 MED ORDER — CEFAZOLIN SODIUM-DEXTROSE 2-4 GM/100ML-% IV SOLN: 2.0000 g | Freq: Once | INTRAVENOUS | Status: DC

## 2021-07-24 MED ORDER — PHENYLEPHRINE 80 MCG/ML (10ML) SYRINGE FOR IV PUSH (FOR BLOOD PRESSURE SUPPORT)
PREFILLED_SYRINGE | INTRAVENOUS | Status: AC
  Filled 2021-07-24: qty 10

## 2021-07-24 MED ORDER — PHENYLEPHRINE 80 MCG/ML (10ML) SYRINGE FOR IV PUSH (FOR BLOOD PRESSURE SUPPORT)
PREFILLED_SYRINGE | INTRAVENOUS | Status: DC | PRN
  Administered 2021-07-24 (×2): 80 ug via INTRAVENOUS

## 2021-07-24 MED ORDER — DEXAMETHASONE SODIUM PHOSPHATE 10 MG/ML IJ SOLN
INTRAMUSCULAR | Status: DC | PRN
  Administered 2021-07-24: 5 mg via INTRAVENOUS

## 2021-07-24 MED ORDER — FENTANYL CITRATE (PF) 100 MCG/2ML IJ SOLN
INTRAMUSCULAR | Status: AC
  Filled 2021-07-24: qty 2

## 2021-07-24 MED ORDER — ALBUTEROL SULFATE HFA 108 (90 BASE) MCG/ACT IN AERS: 2.0000 | INHALATION_SPRAY | Freq: Four times a day (QID) | RESPIRATORY_TRACT | 2 refills | Status: DC | PRN

## 2021-07-24 MED ORDER — LACTATED RINGERS IV SOLN: INTRAVENOUS | Status: DC | PRN

## 2021-07-24 MED ORDER — SODIUM CHLORIDE 0.9 % IR SOLN
Status: DC | PRN
  Administered 2021-07-24: 3000 mL

## 2021-07-24 MED ORDER — DEXMEDETOMIDINE HCL IN NACL 80 MCG/20ML IV SOLN
INTRAVENOUS | Status: AC
  Filled 2021-07-24: qty 20

## 2021-07-24 MED ORDER — DEXMEDETOMIDINE (PRECEDEX) IN NS 20 MCG/5ML (4 MCG/ML) IV SYRINGE
PREFILLED_SYRINGE | INTRAVENOUS | Status: DC | PRN
  Administered 2021-07-24: 4 ug via INTRAVENOUS

## 2021-07-24 MED ORDER — TAMSULOSIN HCL 0.4 MG PO CAPS: 0.4000 mg | ORAL_CAPSULE | Freq: Every day | ORAL | 3 refills | Status: DC

## 2021-07-24 SURGICAL SUPPLY — 22 items
BAG DRAIN URO TABLE W/ADPT NS (BAG) ×2 IMPLANT
BAG DRN 8 ADPR NS SKTRN CSTL (BAG) ×1
BAG HAMPER (MISCELLANEOUS) ×2 IMPLANT
CATH INTERMIT  6FR 70CM (CATHETERS) ×2 IMPLANT
CLOTH BEACON ORANGE TIMEOUT ST (SAFETY) ×2 IMPLANT
GLOVE BIOGEL PI IND STRL 7.0 (GLOVE) ×2 IMPLANT
GLOVE BIOGEL PI INDICATOR 7.0 (GLOVE) ×2
GLOVE SURG SS PI 6.5 STRL IVOR (GLOVE) ×1 IMPLANT
GLOVE SURG SS PI 7.5 STRL IVOR (GLOVE) ×1 IMPLANT
GOWN STRL REUS W/TWL LRG LVL3 (GOWN DISPOSABLE) ×2 IMPLANT
GOWN STRL REUS W/TWL XL LVL3 (GOWN DISPOSABLE) ×2 IMPLANT
GUIDEWIRE STR ZIPWIRE 035X150 (MISCELLANEOUS) ×2 IMPLANT
IV NS IRRIG 3000ML ARTHROMATIC (IV SOLUTION) ×2 IMPLANT
KIT TURNOVER CYSTO (KITS) ×2 IMPLANT
MANIFOLD NEPTUNE II (INSTRUMENTS) ×2 IMPLANT
PACK CYSTO (CUSTOM PROCEDURE TRAY) ×2 IMPLANT
PAD ARMBOARD 7.5X6 YLW CONV (MISCELLANEOUS) ×2 IMPLANT
SET AMPLATZ RENAL DILATOR (MISCELLANEOUS) ×1 IMPLANT
STENT CONTOUR 7FRX24X.038 (STENTS) ×1 IMPLANT
SYR 10ML LL (SYRINGE) ×2 IMPLANT
TOWEL OR 17X26 4PK STRL BLUE (TOWEL DISPOSABLE) ×2 IMPLANT
WATER STERILE IRR 500ML POUR (IV SOLUTION) ×2 IMPLANT

## 2021-07-24 NOTE — Discharge Instructions (Signed)
1)Please follow-up with Urologist Dr. Alyson Ingles in about --- for recheck and follow-up evaluation  in his office----[facility], [address], [place], [place] [address] Phone Number----[phone]  2)Repeat CBC and BMP Blood test in 1 to 2 weeks

## 2021-07-24 NOTE — Anesthesia Procedure Notes (Signed)
Procedure Name: LMA Insertion Date/Time: 07/24/2021 7:45 AM  Performed by: Gwyndolyn Saxon, CRNAPre-anesthesia Checklist: Patient identified, Emergency Drugs available, Suction available and Patient being monitored Patient Re-evaluated:Patient Re-evaluated prior to induction Oxygen Delivery Method: Circle system utilized Preoxygenation: Pre-oxygenation with 100% oxygen Induction Type: IV induction Ventilation: Mask ventilation without difficulty LMA: LMA inserted LMA Size: 4.0 Number of attempts: 1 Placement Confirmation: positive ETCO2 and breath sounds checked- equal and bilateral Tube secured with: Tape Dental Injury: Teeth and Oropharynx as per pre-operative assessment

## 2021-07-24 NOTE — TOC Transition Note (Signed)
Transition of Care [facility]) - CM/SW Discharge Note   Patient Details  Name: Sandra [name] MRN: [id number] Date of Birth: Jul 30, 1975  [facility] CM/SW Contact:  Shade Flood, LCSW Phone Number: 07/24/2021, 1:02 PM   Clinical Narrative:     Pt stable for dc today per MD. Damaris Schooner with pt again about outpatient PT referral. CMS provider options reviewed. Referral sent to [facility] outpatient rehab at pt request. They will call her to arrange appointments.  No other TOC needs for dc.  Final next level of care: OP Rehab Barriers to Discharge: Barriers Resolved   Patient Goals and CMS Choice Patient states their goals for this hospitalization and ongoing recovery are:: go home CMS Medicare.gov Compare Post Acute Care list provided to:: Patient    Discharge Placement                       Discharge Plan and Services In-house Referral: Clinical Social Work              DME Arranged: Gilford Rile rolling DME Agency: [employer] Date DME Agency Contacted: 07/20/21                Social Determinants of Health (SDOH) Interventions     Readmission Risk Interventions    07/21/2021    3:24 PM  Readmission Risk Prevention Plan  Transportation Screening Complete  HRI or [place] Complete  Social Work Consult for [place] Planning/Counseling Complete  Palliative Care Screening Not Applicable  Medication Review Press photographer) Complete

## 2021-07-24 NOTE — Anesthesia Postprocedure Evaluation (Signed)
Anesthesia Post Note  Patient: Sandra [name]  Procedure(s) Performed: CYSTOSCOPY WITH RETROGRADE PYELOGRAM/URETERAL STENT PLACEMENT (Right: Ureter)  Patient location during evaluation: PACU Anesthesia Type: General Level of consciousness: awake and alert Pain management: pain level controlled Vital Signs Assessment: post-procedure vital signs reviewed and stable Respiratory status: spontaneous breathing, nonlabored ventilation, respiratory function stable and patient connected to nasal cannula oxygen Cardiovascular status: blood pressure returned to baseline and stable Postop Assessment: no apparent nausea or vomiting Anesthetic complications: no   There were no known notable events for this encounter.   Last Vitals:  Vitals:   07/24/21 0830 07/24/21 0845  BP: (!) 110/59 119/75  Pulse: 66 64  Resp: 11 10  Temp:    SpO2: 95% 94%    Last Pain:  Vitals:   07/24/21 1013  TempSrc:   PainSc: 4                  Trixie Rude

## 2021-07-24 NOTE — Discharge Summary (Signed)
Sandra [name], is a 46 y.o. female  DOB 10/12/1975  MRN [id number].  Admission date:  07/18/2021  Admitting Physician  Bernadette Hoit, DO  Discharge Date:  07/24/2021   Primary MD  Coolidge Breeze, FNP  Recommendations for primary care physician for things to follow:   1)Please follow-up with Urologist Dr. Alyson Ingles in about --- for recheck and follow-up evaluation  in his office----[facility] [place], [address] [place] [address], [place] [address] Phone Number----[phone]  2)Repeat CBC and BMP Blood test in 1 to 2 weeks   Admission Diagnosis  Intractable nausea and vomiting [R11.2]   Discharge Diagnosis  Intractable nausea and vomiting [R11.2]    Principal Problem:   Intractable nausea and vomiting Active Problems:   Type 2 diabetes mellitus with hyperglycemia (HCC)   Gastroesophageal reflux disease   CAD (coronary artery disease)   Familial hypercholesterolemia   Constipation   Abdominal pain   Hydroureteronephrosis   Recurrent falls   Obesity (BMI 30-39.9)      Past Medical History:  Diagnosis Date   ADHD (attention deficit hyperactivity disorder)    Allergic rhinitis    Anemia    hx of   Barrett's esophagus    Bipolar 1 disorder (HCC)    Bladder tumor    CAD (coronary artery disease)    Cancer ([place])    bladder   Constipation    Dilated cardiomyopathy ([place]) 12/20/2020   Echo 6/22 ([facility]): EF 43, mild MR, mild TR // Echo 3/22: EF 50-55   Dizziness    Full dentures    GERD (gastroesophageal reflux disease)    Gross hematuria    Hyperlipidemia    Hypertension    Stable angina (HCC)    Type 2 diabetes mellitus ([place])    Urgency of urination    dysuria, sui   Wears glasses     Past Surgical History:  Procedure Laterality Date   BALLOON DILATION N/A 02/17/2021   Procedure: BALLOON DILATION;  Surgeon: Eloise Harman, DO;  [facility]: [facility];  Service: Endoscopy;   Laterality: N/A;   BIOPSY  02/17/2021   Procedure: BIOPSY;  Surgeon: Eloise Harman, DO;  [facility];  Service: Endoscopy;;   COLONOSCOPY WITH PROPOFOL N/A 02/17/2021   Surgeon: Eloise Harman, DO; nonbleeding internal hemorrhoids, otherwise normal exam.  Repeat in 10 years.   CYSTOSCOPY N/A 10/03/2015   Procedure: CYSTOSCOPY;  Surgeon: Irine Seal, MD;  Location: [facility];  Service: Urology;  Laterality: N/A;   CYSTOSCOPY WITH STENT PLACEMENT Right 10/31/2015   Procedure: CYSTOSCOPY WITH ATTEMPTED RIGHT URETERAL OPENING;  Surgeon: Irine Seal, MD;  Location: [facility];  Service: Urology;  Laterality: Right;   ESOPHAGOGASTRODUODENOSCOPY (EGD) WITH PROPOFOL N/A 02/17/2021   Surgeon: Eloise Harman, DO; 2 cm hiatal hernia, Barrett's esophagus, gastritis with biopsies negative for H. pylori.  S/p empiric esophageal dilation.  Repeat in 3 years.   FOOT SURGERY Left    September 2022, broke 7 bones in left foot  INTERSTIM IMPLANT PLACEMENT N/A 03/14/2018   Procedure: Barrie Lyme IMPLANT FIRST STAGE;  Surgeon: Cleon Gustin, MD;  Location: [facility];  Service: Urology;  Laterality: N/A;   INTERSTIM IMPLANT PLACEMENT N/A 03/30/2018   Procedure: Barrie Lyme IMPLANT SECOND STAGE;  Surgeon: Cleon Gustin, MD;  Location: [facility];  Service: Urology;  Laterality: N/A;   LEFT HEART CATH AND CORONARY ANGIOGRAPHY N/A 05/31/2020   Procedure: LEFT HEART CATH AND CORONARY ANGIOGRAPHY;  Surgeon: Troy Sine, MD;  Location: [facility];  Service: Cardiovascular;  Laterality: N/A;   MULTIPLE TOOTH EXTRACTIONS  2015   OVARIAN CYST REMOVAL Right 2004 approx   SHOULDER OPEN ROTATOR CUFF REPAIR Left 04/20/2019   Procedure: ROTATOR CUFF REPAIR SHOULDER OPEN;  Surgeon: Carole Civil, MD;  Location: [facility];  Service: Orthopedics;  Laterality: Left;   TONSILLECTOMY  12/30/2004   TOTAL ABDOMINAL HYSTERECTOMY W/ BILATERAL SALPINGOOPHORECTOMY   05/16/2004   TRANSURETHRAL RESECTION OF BLADDER TUMOR N/A 10/03/2015   Procedure: TRANSURETHRAL RESECTION OF BLADDER TUMOR (TURBT);  Surgeon: Irine Seal, MD;  Location: [facility];  Service: Urology;  Laterality: N/A;   TRANSURETHRAL RESECTION OF BLADDER TUMOR N/A 10/31/2015   Procedure: RE-STAGINGG TRANSURETHRAL RESECTION OF BLADDER TUMOR (TURBT);  Surgeon: Irine Seal, MD;  Location: [facility];  Service: Urology;  Laterality: N/A;   TRANSURETHRAL RESECTION OF BLADDER TUMOR N/A 08/17/2016   Procedure: TRANSURETHRAL RESECTION OF BLADDER TUMOR (TURBT);  Surgeon: Cleon Gustin, MD;  Location: [facility];  Service: Urology;  Laterality: N/A;       HPI  from the history and physical done on the day of admission:   HPI: Sandra [name] is a 46 y.o. female with medical history significant of hypertension, dilated cardiomyopathy, bipolar disorder, CAD, bladder cancer (in remission) who presents to the emergency department due to 1 day onset of nausea and vomiting.  Patient states that she woke up around 2 AM on Thursday (7/6) to use the bathroom after this, she complained of right lower quadrant pain, which was associated with nausea and vomiting, patient states that she has not been able to keep anything down since onset of symptoms, she tried Zofran at home, but due to being unable to keep anything down, she vomited the medication.  She also complained of an associated suprapubic cramping.  She denies any burning sensation on urination or any other irritative bladder symptoms.  Patient denies fever, chills, chest pain.,  She states that she went to [facility] with CT scan without contrast was done and it showed right-sided moderate hydronephrosis with no obvious stone seen, but there was questionable evidence of malignancy.  He presented to the ED here at [facility], but she left prior to being seen due to long wait time.  Patient states that her current symptoms are  similar to when she was for diagnosis of bladder cancer and she also complained of recurrent falls and imbalance since onset of symptoms.     ED Course:  In the emergency department, he was hemodynamically stable.  Work-up in ED showed normal CBC and BMP except for hyperglycemia 27, urinalysis was normal. CT abdomen and pelvis without contrast showed a moderate right hydroureteronephrosis, no radiosensitive stone seen, but the obstruction is at the level of the right ureteral vesicle junction, underlying bladder versus ureteral malignancy is possible. CT abdomen and pelvis with contrast showed mild right-sided hydroureteronephrosis to the level of the bladder with right perinephric fat stranding. No obstructing calculi identified.  Findings may be related to recently passed calculus (nonvisualized), infection involving the right kidney and ureter, or distal ureteral obstructive stricture or lesion (less likely). Urologist (Dr. Alinda Money) was consulted and will consult with patient in the morning per ED PA.  Patient was treated with IV fentanyl and Dilaudid, IV hydration was provided.  Hospitalist was asked to admit patient for further evaluation and management.  Review of Systems: Review of systems as noted in the HPI. All other systems reviewed and are negative.      Hospital Course:   Assessment and Plan: 1) Right-sided hydroureteronephrosis /right UVJ stricture- urologist Dr Alyson Ingles did cystoscopy with Rt Retrograde Pyelography with Rt Ureteral Dilatation with placement of Right 7 x 24 JJ ureteral stent without tetheron 07/24/21- ---No further emesis,  -Overall improved abdominal pain -Flomax as prescribed -Outpatient follow-up with urologist   2)IBS-C-- -Diarrhea - RESOLVED - STOPPED LINZESS -Follow-up with GI for further management -Consider daily fiber supplements   3)Type 2 diabetes mellitus with hyperglycemia -A1c is 8.1 reflecting uncontrolled diabetes with hyperglycemia  PTA -Continue insulin regimen -Low calorie diet, portion control and increase physical activity discussed with patient -Follow-up with PCP for adjustment of insulin regimen    4)Familial hypercholesterolemia with Xanthelesma Continue Repatha, Crestor, Tricor   5)CAD--no chest pains or ACS symptoms at this time Continue aspirin, nitroglycerin and Crestor   6)Tobacco Abuse--- smoking cessation strongly advised especially due to #3, #4  and # 5 above  7)GERD Continue Protonix   8)Recurrent falls and imbalance Patient states that she has felt imbalance with falls without injury since onset of symptoms Continue fall precaution and neurochecks Consult PT/OT eval and treat -- recommending HHPT and rolling walker -Be judicious with opiates to avoid oversedation   9)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  Code Status: full  Family communication--- discussed with patient's sister at bedside Disposition: home   Consultants:  Urology (Dr. Alinda Money) Alyson Ingles)  Discharge Condition: stable  Follow UP   Follow-up Information     Coolidge Breeze, FNP. Schedule an appointment as soon as possible for a visit in 2 week(s).   Specialty: Family Medicine Contact information: [address] [place] [address] [phone]         Josue Hector, MD .   Specialty: Cardiology Contact information: [phone] [address] [place] [address] [phone]         Cleon Gustin, MD. Schedule an appointment as soon as possible for a visit in 1 week(s).   Specialty: Urology Contact information: [address]  [place] [address] [phone]         Cleon Gustin, MD .   Specialty: Urology Contact  information: [address] [place] [address] [phone]                  Diet and Activity recommendation:  As advised  Discharge Instructions     Discharge Instructions     Ambulatory referral to Physical Therapy   Complete by: As directed    Call MD for:  difficulty breathing, headache or visual disturbances   Complete by: As directed    Call MD for:  persistant dizziness or light-headedness   Complete by: As directed    Call MD for:  persistant nausea and vomiting   Complete by: As directed    Call MD for:  temperature >100.4   Complete by: As directed    Diet - low sodium heart healthy   Complete by: As directed    Discharge instructions   Complete by: As directed    1)Please follow-up with Urologist Dr. Alyson Ingles in about --- for recheck and follow-up evaluation  in his office----[facility] [place], [address], [place] [address] Phone Number----[phone]  2)Repeat CBC and BMP Blood test in 1 to 2 weeks   Increase activity slowly   Complete by: As directed          Discharge Medications     Allergies as of 07/24/2021       Reactions   Ibuprofen Hives   itching itching itching   Benazepril Hives   Diphenhydramine Hives   Other reaction(s): hives Other reaction(s): hives   Latex Dermatitis   States she had redness to skin and itching    Metronidazole Nausea And Vomiting   Other reaction(s): nausea Other reaction(s): nausea   Oxycodone    Pt has tolerated hydromorphone in the past and takes Norco at home.   Toradol [ketorolac Tromethamine] Itching   Adhesive [tape] Rash   Codeine Nausea And Vomiting, Rash   Other reaction(s): hives   Loratadine Rash   Nystatin-triamcinolone Rash   Penicillins Rash   Has patient had a PCN reaction causing immediate rash, facial/tongue/throat swelling, SOB or lightheadedness with hypotension: No Has patient had a PCN reaction causing severe rash involving mucus membranes  or skin necrosis: Yes Has patient had a PCN reaction that required hospitalization: No Has patient had a PCN reaction occurring within the last 10 years: no If all of the above answers are "NO", then may proceed with Cephalosporin use. Other reaction(s): nausea Received ancef w/o issue (7/23)        Medication List     STOP taking these medications    doxycycline 100 MG tablet Commonly known as: VIBRA-TABS       TAKE these medications    Accu-Chek Guide test strip Generic drug: glucose blood USE AS INSTRUCTED TO MONITOR GLUCOSE 4 TIMES DAILY BEFORE MEALS AND BEFORE BED.   Accu-Chek Guide w/Device Kit 1 Piece by Does not apply route as directed.   Accu-Chek Softclix Lancets lancets Use as instructed to monitor glucose 4 times daily   albuterol 108 (90 Base) MCG/ACT inhaler Commonly known as: ProAir HFA Inhale 2 puffs into the lungs every 6 (six) hours as needed for wheezing or shortness of breath.   aspirin EC 81 MG tablet Take 1 tablet (81 mg total) by mouth daily with breakfast. Swallow whole. What changed: when to take this   Blood Pressure Cuff Misc See admin instructions.   buPROPion 300 MG 24 hr tablet Commonly known as: WELLBUTRIN XL Take 300 mg by mouth every morning.   cloNIDine HCl 0.1 MG Tb12 ER tablet Commonly known as: KAPVAY 1 tablet at bedtime.   Dexcom G6 Sensor Misc Apply new sensor every 10 days as directed   Dexcom G6 Transmitter Misc 1 Device by Does not apply route every 3 (three) months.   fenofibrate 145 MG tablet Commonly known as: TRICOR Take 1 tablet (145 mg total) by mouth daily.   FLUoxetine 40 MG capsule Commonly known as: PROZAC Take 40 mg by mouth daily. What changed: Another medication with the same name was removed. Continue taking this medication, and follow the  directions you see here.   gabapentin 300 MG capsule Commonly known as: NEURONTIN TAKE 1 CAPSULE(300 MG) BY MOUTH THREE TIMES DAILY What changed: See the  new instructions.   HYDROcodone-acetaminophen 5-325 MG tablet Commonly known as: NORCO/VICODIN Take 1 tablet by mouth every 6 (six) hours as needed.   hydrocortisone 2.5 % rectal cream Commonly known as: ANUSOL-HC Place 1 application rectally 2 (two) times daily.   hydrOXYzine 25 MG tablet Commonly known as: ATARAX Take 1 tablet (25 mg total) by mouth 3 (three) times daily as needed for itching or anxiety.   insulin aspart 100 UNIT/ML FlexPen Commonly known as: NOVOLOG Inject 10-16 Units into the skin 3 (three) times daily with meals. What changed: additional instructions   insulin glargine 100 UNIT/ML Solostar Pen Commonly known as: LANTUS Inject 45 Units into the skin at bedtime. What changed: how much to take   linaclotide 72 MCG capsule Commonly known as: Linzess Take 1 capsule (72 mcg total) by mouth daily before breakfast.   nicotine 21 mg/24hr patch Commonly known as: NICODERM CQ - dosed in mg/24 hours Place 1 patch (21 mg total) onto the skin daily for 28 days.   nitroGLYCERIN 0.4 MG SL tablet Commonly known as: NITROSTAT Place 1 tablet (0.4 mg total) under the tongue every 5 (five) minutes as needed for chest pain.   pantoprazole 40 MG tablet Commonly known as: PROTONIX Take 1 tablet (40 mg total) by mouth 2 (two) times daily before a meal.   Pen Needles 31G X 6 MM Misc Use to inject insulin 4 times daily   Global Ease Inject Pen Needles 31G X 5 MM Misc Generic drug: Insulin Pen Needle Inject into the skin.   Repatha SureClick 101 MG/ML Soaj Generic drug: Evolocumab Inject 1 Dose into the skin every 14 (fourteen) days.   rosuvastatin 40 MG tablet Commonly known as: CRESTOR Take 1 tablet (40 mg total) by mouth daily.   tamsulosin 0.4 MG Caps capsule Commonly known as: FLOMAX Take 1 capsule (0.4 mg total) by mouth at bedtime.   Vyvanse 40 MG capsule Generic drug: lisdexamfetamine Take 40 mg by mouth every morning.               Durable  Medical Equipment  (From admission, onward)           Start     Ordered   07/20/21 1107  For home use only DME Walker rolling  Once       Question Answer Comment  Walker: With 5 Inch Wheels   Patient needs a walker to treat with the following condition Falls frequently      07/20/21 1107            Major procedures and Radiology Reports - PLEASE review detailed and final reports for all details, in brief -   DG C-Arm 1-60 Min-No Report  Result Date: 07/24/2021 Fluoroscopy was utilized by the requesting physician.  No radiographic interpretation.   US RENAL  Result Date: 07/21/2021 CLINICAL DATA:  Flank pain for 4 days. EXAM: RENAL / URINARY TRACT ULTRASOUND COMPLETE COMPARISON:  CT abdomen pelvis 07/18/2021. FINDINGS: Right Kidney: Renal measurements: 9.8 x 4.3 x 4.0 cm = volume: 89.5 mL. Parenchymal echogenicity within normal limits. Moderate right hydronephrosis. Ureter cannot be followed. No right ureteral jet. Left Kidney: Renal measurements: 11.3 x 5.1 x 5.2 cm = volume: 157.5 mL. Echogenicity within normal limits. No mass or hydronephrosis visualized. Bladder: Again, right ureteral jet is not visualized. Other: None.  IMPRESSION: Right hydronephrosis, as on 07/18/2021, with nonvisualization of the right ureteral jet. An obstructing lesion cannot be excluded. Hematuria protocol CT without and with contrast may be helpful in further evaluation, as clinically indicated. Electronically Signed   By: Lorin Picket M.D.   On: 07/21/2021 14:39   CT ABDOMEN PELVIS W CONTRAST  Result Date: 07/18/2021 CLINICAL DATA:  Right lower quadrant abdominal pain. EXAM: CT ABDOMEN AND PELVIS WITH CONTRAST TECHNIQUE: Multidetector CT imaging of the abdomen and pelvis was performed using the standard protocol following bolus administration of intravenous contrast. RADIATION DOSE REDUCTION: This exam was performed according to the departmental dose-optimization program which includes automated exposure  control, adjustment of the mA and/or kV according to patient size and/or use of iterative reconstruction technique. CONTRAST:  170m OMNIPAQUE IOHEXOL 300 MG/ML  SOLN COMPARISON:  CT abdomen and pelvis 02/24/2018 FINDINGS: Lower chest: No acute abnormality. Hepatobiliary: No focal liver abnormality is seen. No gallstones, gallbladder wall thickening, or biliary dilatation. Pancreas: Unremarkable. No pancreatic ductal dilatation or surrounding inflammatory changes. Spleen: Normal in size without focal abnormality. Adrenals/Urinary Tract: There is mild right-sided hydroureteronephrosis the level of the bladder. No urinary tract calculi are seen. There is mild right perinephric fat stranding. Left kidney, adrenal glands, and bladder are within normal limits. Stomach/Bowel: Small hiatal hernia is present. Stomach is otherwise within normal limits. Appendix appears normal. No evidence of bowel wall thickening, distention, or inflammatory changes. Vascular/Lymphatic: Aortic atherosclerosis. No enlarged abdominal or pelvic lymph nodes. Reproductive: Status post hysterectomy. No adnexal masses. Other: Small fat containing umbilical hernia. No ascites or free air. Musculoskeletal: No acute or significant osseous findings. Left-sided sacral stimulator device present. IMPRESSION: 1. Mild right-sided hydroureteronephrosis to the level of the bladder with right perinephric fat stranding. No obstructing calculi identified. Findings may be related to recently passed calculus (nonvisualized), infection involving the right kidney and ureter, or distal ureteral obstructive stricture or lesion (less likely). Recommend clinical correlation and follow-up. 2. The appendix is within normal limits. 3. Small hiatal hernia. Electronically Signed   By: ARonney AstersM.D.   On: 07/18/2021 17:29    Micro Results   No results found for this or any previous visit (from the past 240 hour(s)).  Today   Subjective    Sandra SKossmantoday  has no new complaints -No hematuria -Abdominal pain much improved  No Nausea, Vomiting or Diarrhea No fever  Or chills          Patient has been seen and examined prior to discharge   Objective   Blood pressure 125/62, pulse 83, temperature 98.3 F (36.8 C), temperature source Oral, resp. rate 20, height '4\' 11"'  (1.499 m), weight 77.1 kg, SpO2 98 %.   Intake/Output Summary (Last 24 hours) at 07/24/2021 1509 Last data filed at 07/24/2021 1300 Gross per 24 hour  Intake 2520.17 ml  Output --  Net 2520.17 ml    Exam Gen:- Awake Alert, no acute distress  HEENT:- [place].AT, No sclera icterus, bilateral upper eyelid  Xanthelesma Neck-Supple Neck,No JVD,.  Lungs-  CTAB , good air movement bilaterally CV- S1, S2 normal, regular Abd-  +ve B.Sounds, Abd Soft, No tenderness, no CVA area tenderness    Extremity/Skin:- No  edema,   good pulses Psych-affect is appropriate, oriented x3 Neuro-no new focal deficits, no tremors    Data Review   CBC w Diff:  Lab Results  Component Value Date   WBC 6.7 07/21/2021   HGB 11.5 (L) 07/21/2021   HCT 37.2 07/21/2021  PLT 333 07/21/2021   LYMPHOPCT 37 07/18/2021   MONOPCT 8 07/18/2021   EOSPCT 1 07/18/2021   BASOPCT 0 07/18/2021    CMP:  Lab Results  Component Value Date   NA 139 07/21/2021   NA 136 12/20/2020   K 4.0 07/21/2021   CL 111 07/21/2021   CO2 25 07/21/2021   BUN 10 07/21/2021   BUN 10 12/20/2020   CREATININE 0.82 07/21/2021   PROT 6.8 07/19/2021   PROT 6.6 12/18/2020   ALBUMIN 3.4 (L) 07/19/2021   ALBUMIN 4.1 12/18/2020   BILITOT 0.4 07/19/2021   BILITOT 0.2 12/18/2020   ALKPHOS 104 07/19/2021   AST 21 07/19/2021   ALT 22 07/19/2021  .  Total Discharge time is about 33 minutes  Roxan Hockey M.D on 07/24/2021 at 3:09 PM  Go to [email or web address] -  for contact info  [facility] - Office  [phone]

## 2021-07-24 NOTE — Anesthesia Preprocedure Evaluation (Signed)
Anesthesia Evaluation  Patient identified by MRN, date of birth, ID band Patient awake    Reviewed: Allergy & Precautions, H&P , NPO status , Patient's Chart, lab work & pertinent test results, reviewed documented beta blocker date and time   Airway Mallampati: II  TM Distance: >3 FB Neck ROM: full    Dental no notable dental hx.    Pulmonary neg pulmonary ROS, former smoker,    Pulmonary exam normal breath sounds clear to auscultation       Cardiovascular Exercise Tolerance: Good hypertension, + angina (hx of) + CAD   Rhythm:regular Rate:Normal  IMPRESSIONS Septal hypokinesis inferior basal akinesis . Left ventricular ejection fraction, by estimation, is 50 to 55%. The left ventricle has low normal function. The left ventricle demonstrates regional wall motion abnormalities (see scoring diagram/findings for description). Left ventricular diastolic parameters were normal. 1. 2. Right ventricular systolic function is normal. The right ventricular size is normal. The mitral valve is abnormal. Trivial mitral valve regurgitation. No evidence of mitral stenosis. 3. The aortic valve is tricuspid. Aortic valve regurgitation is not visualized. No aortic stenosis is present. 4. The inferior vena cava is normal in size with greater than 50% respiratory variability, suggesting right atrial pressure of 3 mmHg.   Neuro/Psych PSYCHIATRIC DISORDERS Anxiety Depression Bipolar Disorder negative neurological ROS     GI/Hepatic Neg liver ROS, GERD  Medicated,  Endo/Other  negative endocrine ROSdiabetes, Type 2  Renal/GU negative Renal ROS  negative genitourinary   Musculoskeletal negative musculoskeletal ROS (+)   Abdominal   Peds  Hematology negative hematology ROS (+) anemia ,   Anesthesia Other Findings   Reproductive/Obstetrics negative OB ROS                             Anesthesia  Physical  Anesthesia Plan  ASA: 3  Anesthesia Plan: General   Post-op Pain Management:    Induction:   PONV Risk Score and Plan: Propofol infusion  Airway Management Planned:   Additional Equipment:   Intra-op Plan:   Post-operative Plan:   Informed Consent: I have reviewed the patients History and Physical, chart, labs and discussed the procedure including the risks, benefits and alternatives for the proposed anesthesia with the patient or authorized representative who has indicated his/her understanding and acceptance.     Dental Advisory Given  Plan Discussed with: CRNA  Anesthesia Plan Comments:         Anesthesia Quick Evaluation

## 2021-07-24 NOTE — Interval H&P Note (Signed)
History and Physical Interval Note:  07/24/2021 7:26 AM  Sandra [name]  has presented today for surgery, with the diagnosis of right hydronephrosis.  The various methods of treatment have been discussed with the patient and family. After consideration of risks, benefits and other options for treatment, the patient has consented to  Procedure(s): CYSTOSCOPY WITH RETROGRADE PYELOGRAM/URETERAL STENT PLACEMENT (Right) as a surgical intervention.  The patient's history has been reviewed, patient examined, no change in status, stable for surgery.  I have reviewed the patient's chart and labs.  Questions were answered to the patient's satisfaction.     [name]

## 2021-07-24 NOTE — Transfer of Care (Signed)
Immediate Anesthesia Transfer of Care Note  Patient: Sandra [name]  Procedure(s) Performed: CYSTOSCOPY WITH RETROGRADE PYELOGRAM/URETERAL STENT PLACEMENT (Right: Ureter)  Patient Location: PACU  Anesthesia Type:General  Level of Consciousness: drowsy  Airway & Oxygen Therapy: Patient Spontanous Breathing and Patient connected to nasal cannula oxygen  Post-op Assessment: Report given to RN and Post -op Vital signs reviewed and stable  Post vital signs: Reviewed and stable  Last Vitals:  Vitals Value Taken Time  BP    Temp    Pulse 76 07/24/21 0825  Resp 16 07/24/21 0825  SpO2 95 % 07/24/21 0825  Vitals shown include unvalidated device data.  Last Pain:  Vitals:   07/24/21 0641  TempSrc: Oral  PainSc: 8       Patients Stated Pain Goal: 2 (85/90/93 1121)  Complications: No notable events documented.

## 2021-07-24 NOTE — Op Note (Signed)
.  Preoperative diagnosis: right hydronephrosis  Postoperative diagnosis: right UVJ stricture  Procedure: 1 cystoscopy 2. right retrograde pyelography 3.  Intraoperative fluoroscopy, under one hour, with interpretation 4. right 7 x 24 JJ stent placement 5. Right ureteral dilation  Attending: [name]  Anesthesia: General  Estimated blood loss: None  Drains: Right 7 x 24 JJ ureteral stent without tether, 16 French foley catheter  Specimens: none  Antibiotics: ancef  Findings:right UVJ stricture. Moderate hydronephrosis. No masses/lesions in the bladder. Ureteral orifices in normal anatomic location.  Indications: Patient is a 46 year old female with a history of right hydronephrosis and severe right flank pain  After discussing treatment options, they decided proceed with right stent placement.  Procedure in detail: The patient was brought to the operating room and a brief timeout was done to ensure correct patient, correct procedure, correct site.  General anesthesia was administered patient was placed in dorsal lithotomy position.  Their genitalia was then prepped and draped in usual sterile fashion.  A rigid 47 French cystoscope was passed in the urethra and the bladder.  Bladder was inspected free masses or lesions.  the ureteral orifices were in the normal orthotopic locations. The right ureteral orifice was narrow due to previous bladder tumor resection. We were unable to advance a 6 french ureteral catheter into the right ureteral orifice. We advanced a zip wire through the ureteral catheter and into the right ureteral orifice. The wire was advanced up to the renal pelvis. We then used sequential ureteral dilators to dilate the UVJ from 4 french to 10 french. a 6 french ureteral catheter was then instilled into the right ureteral orifice.  a gentle retrograde was obtained and findings noted above.  we then placed a zip wire through the ureteral catheter and advanced up to the  renal pelvis.    We then placed a 7 x 24 double-j ureteral stent over the original zip wire.  We then removed the wire and good coil was noted in the the renal pelvis under fluoroscopy and the bladder under direct vision.  the bladder was then drained and this concluded the procedure which was well tolerated by patient.  Complications: None  Condition: Stable, extubated, transferred to PACU  Plan: Patient is to be discharged home and followup in 2-3 weeks for right diagnostic ureteroscopy

## 2021-07-25 ENCOUNTER — Encounter (HOSPITAL_COMMUNITY): Payer: Self-pay | Admitting: Urology

## 2021-07-26 ENCOUNTER — Encounter (HOSPITAL_COMMUNITY): Payer: Self-pay

## 2021-07-26 ENCOUNTER — Emergency Department (HOSPITAL_COMMUNITY): Payer: Medicaid Other

## 2021-07-26 ENCOUNTER — Emergency Department (HOSPITAL_COMMUNITY)
Admission: EM | Admit: 2021-07-26 | Discharge: 2021-07-26 | Disposition: A | Discharge status: Home / Self Care | Payer: Medicaid Other | Attending: Emergency Medicine | Admitting: Emergency Medicine

## 2021-07-26 ENCOUNTER — Other Ambulatory Visit: Payer: Self-pay

## 2021-07-26 DIAGNOSIS — Z794 Long term (current) use of insulin: Secondary | ICD-10-CM | POA: Insufficient documentation

## 2021-07-26 DIAGNOSIS — E1165 Type 2 diabetes mellitus with hyperglycemia: Secondary | ICD-10-CM | POA: Insufficient documentation

## 2021-07-26 DIAGNOSIS — I10 Essential (primary) hypertension: Secondary | ICD-10-CM | POA: Diagnosis not present

## 2021-07-26 DIAGNOSIS — R109 Unspecified abdominal pain: Secondary | ICD-10-CM

## 2021-07-26 DIAGNOSIS — Z91040 Latex allergy status: Secondary | ICD-10-CM | POA: Diagnosis not present

## 2021-07-26 DIAGNOSIS — J189 Pneumonia, unspecified organism: Secondary | ICD-10-CM

## 2021-07-26 DIAGNOSIS — Z7982 Long term (current) use of aspirin: Secondary | ICD-10-CM | POA: Diagnosis not present

## 2021-07-26 DIAGNOSIS — R739 Hyperglycemia, unspecified: Secondary | ICD-10-CM

## 2021-07-26 LAB — BLOOD GAS, VENOUS
Acid-Base Excess: 2.2 mmol/L — ABNORMAL HIGH (ref 0.0–2.0)
Bicarbonate: 27.3 mmol/L (ref 20.0–28.0)
Drawn by: 27160
FIO2: 21 %
O2 Saturation: 39.6 %
Patient temperature: 36.5
pCO2, Ven: 42 mmHg — ABNORMAL LOW (ref 44–60)
pH, Ven: 7.42 (ref 7.25–7.43)
pO2, Ven: <31 mmHg — CL (ref 32–45)

## 2021-07-26 LAB — CBC
HCT: 37.9 % (ref 36.0–46.0)
Hemoglobin: 11.6 g/dL — ABNORMAL LOW (ref 12.0–15.0)
MCH: 25.3 pg — ABNORMAL LOW (ref 26.0–34.0)
MCHC: 30.6 g/dL (ref 30.0–36.0)
MCV: 82.8 fL (ref 80.0–100.0)
Platelets: 384 10*3/uL (ref 150–400)
RBC: 4.58 MIL/uL (ref 3.87–5.11)
RDW: 14.5 % (ref 11.5–15.5)
WBC: 12.4 10*3/uL — ABNORMAL HIGH (ref 4.0–10.5)
nRBC: 0 % (ref 0.0–0.2)

## 2021-07-26 LAB — BASIC METABOLIC PANEL
Anion gap: 9 (ref 5–15)
BUN: 14 mg/dL (ref 6–20)
CO2: 24 mmol/L (ref 22–32)
Calcium: 9.1 mg/dL (ref 8.9–10.3)
Chloride: 100 mmol/L (ref 98–111)
Creatinine, Ser: 1.03 mg/dL — ABNORMAL HIGH (ref 0.44–1.00)
GFR, Estimated: >60 mL/min (ref >60)
Glucose, Bld: 482 mg/dL — ABNORMAL HIGH (ref 70–99)
Potassium: 4.6 mmol/L (ref 3.5–5.1)
Sodium: 133 mmol/L — ABNORMAL LOW (ref 135–145)

## 2021-07-26 LAB — URINALYSIS, ROUTINE W REFLEX MICROSCOPIC
Bacteria, UA: NONE SEEN
Bilirubin Urine: NEGATIVE
Glucose, UA: >=500 mg/dL — AB
Ketones, ur: NEGATIVE mg/dL
Nitrite: NEGATIVE
Protein, ur: 100 mg/dL — AB
RBC / HPF: >50 RBC/hpf — ABNORMAL HIGH (ref 0–5)
Specific Gravity, Urine: 1.022 (ref 1.005–1.030)
pH: 7 (ref 5.0–8.0)

## 2021-07-26 LAB — HEPATIC FUNCTION PANEL
ALT: 19 U/L (ref 0–44)
AST: 12 U/L — ABNORMAL LOW (ref 15–41)
Albumin: 3.7 g/dL (ref 3.5–5.0)
Alkaline Phosphatase: 89 U/L (ref 38–126)
Bilirubin, Direct: 0.1 mg/dL (ref 0.0–0.2)
Indirect Bilirubin: 0.3 mg/dL (ref 0.3–0.9)
Total Bilirubin: 0.4 mg/dL (ref 0.3–1.2)
Total Protein: 7.3 g/dL (ref 6.5–8.1)

## 2021-07-26 LAB — CBG MONITORING, ED
Glucose-Capillary: 481 mg/dL — ABNORMAL HIGH (ref 70–99)
Glucose-Capillary: 268 mg/dL — ABNORMAL HIGH (ref 70–99)

## 2021-07-26 LAB — BETA-HYDROXYBUTYRIC ACID: Beta-Hydroxybutyric Acid: 0.06 mmol/L (ref 0.05–0.27)

## 2021-07-26 LAB — LIPASE, BLOOD: Lipase: 24 U/L (ref 11–51)

## 2021-07-26 LAB — TROPONIN I (HIGH SENSITIVITY)
Troponin I (High Sensitivity): 5 ng/L (ref <18)
Troponin I (High Sensitivity): 4 ng/L (ref <18)

## 2021-07-26 MED ORDER — LEVOFLOXACIN IN D5W 750 MG/150ML IV SOLN
750.0000 mg | Freq: Once | INTRAVENOUS | Status: AC
  Administered 2021-07-26: 750 mg via INTRAVENOUS
  Filled 2021-07-26: qty 150

## 2021-07-26 MED ORDER — INSULIN ASPART 100 UNIT/ML IJ SOLN
10.0000 [IU] | Freq: Once | INTRAMUSCULAR | Status: AC
  Administered 2021-07-26: 10 [IU] via INTRAVENOUS
  Filled 2021-07-26: qty 1

## 2021-07-26 MED ORDER — SODIUM CHLORIDE 0.9 % IV SOLN: INTRAVENOUS | Status: DC

## 2021-07-26 MED ORDER — SODIUM CHLORIDE 0.9 % IV BOLUS
1000.0000 mL | Freq: Once | INTRAVENOUS | Status: AC
  Administered 2021-07-26: 1000 mL via INTRAVENOUS

## 2021-07-26 MED ORDER — ONDANSETRON HCL 4 MG/2ML IJ SOLN
4.0000 mg | Freq: Once | INTRAMUSCULAR | Status: AC
  Administered 2021-07-26: 4 mg via INTRAVENOUS
  Filled 2021-07-26: qty 2

## 2021-07-26 MED ORDER — HYDROMORPHONE HCL 1 MG/ML IJ SOLN
1.0000 mg | Freq: Once | INTRAMUSCULAR | Status: AC
  Administered 2021-07-26: 1 mg via INTRAVENOUS
  Filled 2021-07-26: qty 1

## 2021-07-26 MED ORDER — LEVOFLOXACIN 750 MG PO TABS: 750.0000 mg | ORAL_TABLET | Freq: Every day | ORAL | 0 refills | Status: DC

## 2021-07-26 NOTE — Discharge Instructions (Signed)
Work-up here today without any acute findings regarding the right ureteral stent.  But x-rays do raise some concerns about possible pneumonia.  Take the Levaquin as directed make an appointment follow-up with your regular doctor regarding that.  Also follow-up with your regular doctor regarding your blood sugars.  No evidence of any complications from the elevated blood sugar today.  He came down nicely with insulin.  Keep your appointment that you have with Dr. Alyson Ingles for the ureteral stent.  Today CT scan showed no complications with that.

## 2021-07-26 NOTE — ED Notes (Signed)
Patient transported to CT 

## 2021-07-26 NOTE — ED Provider Notes (Signed)
[facility] [facility] Provider Note   CSN: [id number] Arrival date & time: 07/26/21  1641     History  Chief Complaint  Patient presents with   Hyperglycemia    Sandra [name] is a 46 y.o. female.  Patient with recent admission on July 7.  But most recently had a ureteral stent placed right ureter by Dr. Alyson Ingles urology on 13 July.  Patient presenting today with a complaint of elevated blood sugars abdominal pain prickly right flank pain lightheadedness headache.  Patient states her blood sugars have been high since surgery.  No fever here temp 98.8 blood pressure is good 131/77 oxygen sats in the upper 90s.  Patient initially said there was some mild chest pain.  But then denied any chest pain at all really right flank pain patient denied any shortness of breath.  Denied any fevers.  Past medical history sniffer hyperlipidemia bipolar disorder attention deficit disorder gastroesophageal reflux disease history of bladder tumor several years ago type 2 diabetes hypertension dilated cardiomyopathy evaluated at [facility] in 2022.  Past surgical history significant for total abdominal hysterectomy transurethral resection of the bladder tumor in 2017 left heart cath in 2022 balloon dilatation of the esophagus and 23.  Patient's heart cath did show some significant disease.  They just recommended medical management.  It was somewhat diffuse.       Home Medications Prior to Admission medications   Medication Sig Start Date End Date Taking? Authorizing Provider  levofloxacin (LEVAQUIN) 750 MG tablet Take 1 tablet (750 mg total) by mouth daily. 07/26/21  Yes Fredia Sorrow, MD  ACCU-CHEK GUIDE test strip USE AS INSTRUCTED TO MONITOR GLUCOSE 4 TIMES DAILY BEFORE MEALS AND BEFORE BED. 04/29/21   Brita Romp, NP  Accu-Chek Softclix Lancets lancets Use as instructed to monitor glucose 4 times daily 07/10/20   Brita Romp, NP  albuterol (PROAIR HFA) 108 (90 Base) MCG/ACT  inhaler Inhale 2 puffs into the lungs every 6 (six) hours as needed for wheezing or shortness of breath. 07/24/21   Roxan Hockey, MD  aspirin EC 81 MG tablet Take 1 tablet (81 mg total) by mouth daily with breakfast. Swallow whole. 07/24/21   Roxan Hockey, MD  Blood Glucose Monitoring Suppl (ACCU-CHEK GUIDE) w/Device KIT 1 Piece by Does not apply route as directed. 04/15/20   Brita Romp, NP  Blood Pressure Monitoring (BLOOD PRESSURE CUFF) MISC See admin instructions.    [provider]  buPROPion (WELLBUTRIN XL) 300 MG 24 hr tablet Take 300 mg by mouth every morning. 01/18/20   [provider]  cloNIDine HCl (KAPVAY) 0.1 MG TB12 ER tablet 1 tablet at bedtime.    [provider]  Continuous Blood Gluc Sensor (DEXCOM G6 SENSOR) MISC Apply new sensor every 10 days as directed 06/24/21   Brita Romp, NP  Continuous Blood Gluc Transmit (DEXCOM G6 TRANSMITTER) MISC 1 Device by Does not apply route every 3 (three) months. 06/24/21   Brita Romp, NP  Evolocumab (REPATHA SURECLICK) 119 MG/ML SOAJ Inject 1 Dose into the skin every 14 (fourteen) days. 06/24/21   Hilty, Nadean Corwin, MD  fenofibrate (TRICOR) 145 MG tablet Take 1 tablet (145 mg total) by mouth daily. 06/24/21   Hilty, Nadean Corwin, MD  FLUoxetine (PROZAC) 40 MG capsule Take 40 mg by mouth daily. 04/02/20   [provider]  gabapentin (NEURONTIN) 300 MG capsule TAKE 1 CAPSULE(300 MG) BY MOUTH THREE TIMES DAILY Patient taking differently: Take 300 mg by  mouth 3 (three) times daily. 08/08/19   Edrick Kins, DPM  GLOBAL EASE INJECT PEN NEEDLES 31G X 5 MM MISC Inject into the skin. 03/13/21   [provider]  HYDROcodone-acetaminophen (NORCO/VICODIN) 5-325 MG tablet Take 1 tablet by mouth every 6 (six) hours as needed. 07/24/21   Roxan Hockey, MD  hydrocortisone (ANUSOL-HC) 2.5 % rectal cream Place 1 application rectally 2 (two) times daily. 02/06/21   Erenest Rasher, PA-C  hydrOXYzine  (ATARAX) 25 MG tablet Take 1 tablet (25 mg total) by mouth 3 (three) times daily as needed for itching or anxiety. 07/24/21   Emokpae, Courage, MD  insulin aspart (NOVOLOG) 100 UNIT/ML FlexPen Inject 10-16 Units into the skin 3 (three) times daily with meals. Patient taking differently: Inject 10-16 Units into the skin 3 (three) times daily with meals. Sliding scale 02/24/21   Brita Romp, NP  insulin glargine (LANTUS) 100 UNIT/ML Solostar Pen Inject 45 Units into the skin at bedtime. Patient taking differently: Inject 30 Units into the skin at bedtime. 06/24/21   Brita Romp, NP  Insulin Pen Needle (PEN NEEDLES) 31G X 6 MM MISC Use to inject insulin 4 times daily 02/24/21   Brita Romp, NP  linaclotide [facility]) 72 MCG capsule Take 1 capsule (72 mcg total) by mouth daily before breakfast. 06/18/21   Erenest Rasher, PA-C  nicotine (NICODERM CQ - DOSED IN MG/24 HOURS) 21 mg/24hr patch Place 1 patch (21 mg total) onto the skin daily for 28 days. 07/24/21 08/21/21  Roxan Hockey, MD  nitroGLYCERIN (NITROSTAT) 0.4 MG SL tablet Place 1 tablet (0.4 mg total) under the tongue every 5 (five) minutes as needed for chest pain. 12/20/20 07/18/21  Richardson Dopp T, PA-C  pantoprazole (PROTONIX) 40 MG tablet Take 1 tablet (40 mg total) by mouth 2 (two) times daily before a meal. 06/18/21   Erenest Rasher, PA-C  rosuvastatin (CRESTOR) 40 MG tablet Take 1 tablet (40 mg total) by mouth daily. 06/24/21   Hilty, Nadean Corwin, MD  tamsulosin (FLOMAX) 0.4 MG CAPS capsule Take 1 capsule (0.4 mg total) by mouth at bedtime. 07/24/21   Emokpae, Courage, MD  VYVANSE 40 MG capsule Take 40 mg by mouth every morning. 12/20/19   [provider]      Allergies    Ibuprofen, Benazepril, Diphenhydramine, Latex, Metronidazole, Oxycodone, Toradol [ketorolac tromethamine], Adhesive [tape], Codeine, Loratadine, Nystatin-triamcinolone, and Penicillins    Review of Systems   Review of Systems  Constitutional:   Negative for chills and fever.  HENT:  Negative for ear pain and sore throat.   Eyes:  Negative for pain and visual disturbance.  Respiratory:  Negative for cough and shortness of breath.   Cardiovascular:  Negative for chest pain and palpitations.  Gastrointestinal:  Positive for abdominal pain. Negative for vomiting.  Genitourinary:  Positive for flank pain. Negative for dysuria and hematuria.  Musculoskeletal:  Negative for arthralgias and back pain.  Skin:  Negative for color change and rash.  Neurological:  Positive for light-headedness and headaches. Negative for seizures and syncope.  All other systems reviewed and are negative.   Physical Exam Updated Vital Signs BP 115/64   Pulse 84   Temp 98.1 F (36.7 C) (Oral)   Resp 16   Ht 1.524 m (5')   Wt 81.2 kg   SpO2 95%   BMI 34.96 kg/m  Physical Exam Vitals and nursing note reviewed.  Constitutional:      General: She is not in  acute distress.    Appearance: Normal appearance. She is well-developed.  HENT:     Head: Normocephalic and atraumatic.  Eyes:     Extraocular Movements: Extraocular movements intact.     Conjunctiva/sclera: Conjunctivae normal.     Pupils: Pupils are equal, round, and reactive to light.  Cardiovascular:     Rate and Rhythm: Normal rate and regular rhythm.     Heart sounds: No murmur heard. Pulmonary:     Effort: Pulmonary effort is normal. No respiratory distress.     Breath sounds: Normal breath sounds.  Abdominal:     General: There is no distension.     Palpations: Abdomen is soft.     Tenderness: There is no abdominal tenderness. There is no guarding.  Musculoskeletal:        General: No swelling.     Cervical back: Normal range of motion and neck supple.  Skin:    General: Skin is warm and dry.     Capillary Refill: Capillary refill takes less than 2 seconds.  Neurological:     General: No focal deficit present.     Mental Status: She is alert and oriented to person, place, and  time.  Psychiatric:        Mood and Affect: Mood normal.     ED Results / Procedures / Treatments   Labs (all labs ordered are listed, but only abnormal results are displayed) Labs Reviewed  BASIC METABOLIC PANEL - Abnormal; Notable for the following components:      Result Value   Sodium 133 (*)    Glucose, Bld 482 (*)    Creatinine, Ser 1.03 (*)    All other components within normal limits  CBC - Abnormal; Notable for the following components:   WBC 12.4 (*)    Hemoglobin 11.6 (*)    MCH 25.3 (*)    All other components within normal limits  URINALYSIS, ROUTINE W REFLEX MICROSCOPIC - Abnormal; Notable for the following components:   Glucose, UA >=500 (*)    Hgb urine dipstick LARGE (*)    Protein, ur 100 (*)    Leukocytes,Ua TRACE (*)    RBC / HPF >50 (*)    All other components within normal limits  BLOOD GAS, VENOUS - Abnormal; Notable for the following components:   pCO2, Ven 42 (*)    pO2, Ven <31 (*)    Acid-Base Excess 2.2 (*)    All other components within normal limits  HEPATIC FUNCTION PANEL - Abnormal; Notable for the following components:   AST 12 (*)    All other components within normal limits  CBG MONITORING, ED - Abnormal; Notable for the following components:   Glucose-Capillary 481 (*)    All other components within normal limits  CBG MONITORING, ED - Abnormal; Notable for the following components:   Glucose-Capillary 268 (*)    All other components within normal limits  BETA-HYDROXYBUTYRIC ACID  LIPASE, BLOOD  CBG MONITORING, ED  TROPONIN I (HIGH SENSITIVITY)  TROPONIN I (HIGH SENSITIVITY)    EKG None  Radiology DG Chest Port 1 View  Result Date: 07/26/2021 CLINICAL DATA:  Chest pain.  Abdominal pain. EXAM: PORTABLE CHEST 1 VIEW COMPARISON:  Radiograph 01/23/2021. Lung bases from abdominopelvic CT earlier today. FINDINGS: Ill-defined right suprahilar opacity. The left lower lobe opacities on CT are not well seen by radiograph. Upper normal  heart size. Stable mediastinal contours. No pleural effusion or pneumothorax. IMPRESSION: Ill-defined right suprahilar opacity is suspicious for pneumonia.  Left lower lobe opacities are seen on CT abdomen today, not well seen by radiograph. Electronically Signed   By: Keith Rake M.D.   On: 07/26/2021 19:47   CT Renal Stone Study  Result Date: 07/26/2021 CLINICAL DATA:  Flank pain, kidney stone suspected Recent right-sided stent placement EXAM: CT ABDOMEN AND PELVIS WITHOUT CONTRAST TECHNIQUE: Multidetector CT imaging of the abdomen and pelvis was performed following the standard protocol without IV contrast. RADIATION DOSE REDUCTION: This exam was performed according to the departmental dose-optimization program which includes automated exposure control, adjustment of the mA and/or kV according to patient size and/or use of iterative reconstruction technique. COMPARISON:  CT 07/18/2021 FINDINGS: Lower chest: New patchy, ground-glass and nodular areas of airspace consolidation in the left lower lobe. No pleural effusion. Hepatobiliary: No focal hepatic abnormality on this unenhanced exam. Liver is enlarged spanning 20 cm cranial caudal. Gallbladder physiologically distended, no calcified stone. No biliary dilatation. Pancreas: No ductal dilatation or inflammation. Spleen: Normal in size without focal abnormality. Adrenals/Urinary Tract: Normal adrenal glands. Placement of right nephroureteral stent. No stone or stone fragments are seen along the course of the stent. The right renal collecting system is decompressed. No right hydronephrosis. No significant perinephric edema. No intrarenal calculi. No left hydronephrosis. Partially distended urinary bladder. No bladder wall thickening. Stomach/Bowel: Tiny hiatal hernia. No small bowel obstruction or inflammation. Moderate to large volume of stool in the ascending, transverse, and descending colon. Small volume of stool in the sigmoid colon colonic  inflammation. The appendix is normal. Vascular/Lymphatic: Aortic atherosclerosis without aneurysm. No portal venous or mesenteric gas. No bulky abdominopelvic adenopathy Reproductive: Hysterectomy without adnexal mass. Other: No free air or ascites. Pre sacral stimulator with battery pack on the left. Small fat containing umbilical hernia. Musculoskeletal: There are no acute or suspicious osseous abnormalities. Transitional lumbosacral anatomy. IMPRESSION: 1. Placement of right nephroureteral stent with decompression of the right renal collecting system. No stone or stone fragments are seen along the course of the stent. 2. New patchy, ground-glass and nodular areas of airspace consolidation in the left lower lobe, suspicious for pneumonia. 3. Moderate to large volume of stool in the colon, can be seen with constipation. 4. Hepatomegaly. Aortic Atherosclerosis (ICD10-I70.0). Electronically Signed   By: Keith Rake M.D.   On: 07/26/2021 19:46    Procedures Procedures    Medications Ordered in ED Medications  0.9 %  sodium chloride infusion ( Intravenous New Bag/Given 07/26/21 1923)  levofloxacin (LEVAQUIN) IVPB 750 mg (750 mg Intravenous New Bag/Given 07/26/21 2012)  ondansetron (ZOFRAN) injection 4 mg (has no administration in time range)  HYDROmorphone (DILAUDID) injection 1 mg (has no administration in time range)  sodium chloride 0.9 % bolus 1,000 mL (1,000 mLs Intravenous New Bag/Given 07/26/21 1924)  insulin aspart (novoLOG) injection 10 Units (10 Units Intravenous Given 07/26/21 1923)    ED Course/ Medical Decision Making/ A&P                           Medical Decision Making Amount and/or Complexity of Data Reviewed Labs: ordered. Radiology: ordered.  Risk Prescription drug management.  CRITICAL CARE Performed by: Fredia Sorrow Total critical care time: 35 minutes Critical care time was exclusive of separately billable procedures and treating other patients. Critical care was  necessary to treat or prevent imminent or life-threatening deterioration. Critical care was time spent personally by me on the following activities: development of treatment plan with patient and/or surrogate as well as  nursing, discussions with consultants, evaluation of patient's response to treatment, examination of patient, obtaining history from patient or surrogate, ordering and performing treatments and interventions, ordering and review of laboratory studies, ordering and review of radiographic studies, pulse oximetry and re-evaluation of patient's condition.  Work-up included IV insulin for markedly elevated blood sugar on presentation. Basic metabolic panel had a blood sugar 482 no acidosis.  Renal function greater than 60 GFR mild leukocytosis with a white count of 12.4 hemoglobin 11.6.  Although the patient now denies chest pain initial troponin was 5.  Chest x-ray had ill-defined right suprahilar opacity suspicious for possible pneumonia left lower lobe opacities are seen on CT of abdomen.  CT of abdomen had no acute findings showed that the stent was in place with good flow through the ureter.  But did raise concerns for left lower lobe infiltrate and patient recently was admitted on July 7 so we will call this a hospital-acquired pneumonia.  Plus patient has significant allergies to penicillin and does not meet criteria for cephalosporins so we will treat with Levaquin.  Patient's oxygen sats are good so probably will not require admission.  Patient received 10 units of IV insulin blood sugars came down to 281.  CT scan did not have any other acute findings.  Patient received IV Levaquin here and will continue on Levaquin p.o. at home follow-up with primary care doctor to follow-up with her blood sugars and regarding the pneumonia will follow-up with urology as scheduled.  Again the ureteral stent seem to have no complicating factors.   Final Clinical Impression(s) / ED Diagnoses Final  diagnoses:  Hyperglycemia  HCAP (healthcare-associated pneumonia)  Flank pain    Rx / DC Orders ED Discharge Orders          Ordered    levofloxacin (LEVAQUIN) 750 MG tablet  Daily        07/26/21 2123              Fredia Sorrow, MD 07/26/21 2144

## 2021-07-26 NOTE — ED Triage Notes (Signed)
Pt presents with hyperglycemia, abd pain, and lightheadedness. Pt has a uretal stent placed this past week, and pt states he blood sugar has been high since the surgery.

## 2021-07-29 NOTE — Telephone Encounter (Signed)
Patient requesting pain medication, please advise.

## 2021-07-30 ENCOUNTER — Telehealth: Payer: Self-pay | Admitting: Urology

## 2021-07-30 ENCOUNTER — Ambulatory Visit: Payer: Medicaid Other | Admitting: Urology

## 2021-07-30 ENCOUNTER — Other Ambulatory Visit: Payer: Self-pay | Admitting: Urology

## 2021-07-30 MED ORDER — HYDROMORPHONE HCL 4 MG PO TABS: 4.0000 mg | ORAL_TABLET | ORAL | 0 refills | Status: DC | PRN

## 2021-07-30 NOTE — Telephone Encounter (Signed)
[facility] called inquiring about incontinence supplies for pt.  Paperwork was sent over 06/24/21

## 2021-08-04 ENCOUNTER — Other Ambulatory Visit: Payer: Self-pay | Admitting: Urology

## 2021-08-04 ENCOUNTER — Telehealth: Payer: Self-pay | Admitting: Nurse Practitioner

## 2021-08-04 NOTE — Telephone Encounter (Signed)
Pt said she needs her Value of Novolog and the needles for the Pods and the Pump to be sent in to [place]

## 2021-08-05 ENCOUNTER — Telehealth: Payer: Self-pay

## 2021-08-05 MED ORDER — OMNIPOD DASH PODS (GEN 4) MISC: 3 refills | Status: DC

## 2021-08-05 MED ORDER — INSULIN ASPART 100 UNIT/ML IJ SOLN: INTRAMUSCULAR | 6 refills | Status: DC

## 2021-08-05 MED ORDER — INSULIN SYRINGE 29G X 1/2" 1 ML MISC
6 refills | Status: AC
Start: 2021-08-05 — End: ?

## 2021-08-05 NOTE — Telephone Encounter (Signed)
Patient is requesting pain meds she took her last tablet from 07/19 today and states she is in sever pain and needs another prescription sent to her pharmacy.

## 2021-08-05 NOTE — Telephone Encounter (Signed)
Are you able to find this paperwork?

## 2021-08-05 NOTE — Addendum Note (Signed)
Addended by: Brita Romp on: 08/05/2021 07:26 AM   Modules accepted: Orders

## 2021-08-05 NOTE — Telephone Encounter (Signed)
done

## 2021-08-06 NOTE — Telephone Encounter (Signed)
I'll check with them. Thank you!

## 2021-08-06 NOTE — Telephone Encounter (Signed)
[facility]  will be refaxing paper work over.

## 2021-08-07 NOTE — Telephone Encounter (Signed)
Nausea medication?

## 2021-08-07 NOTE — Telephone Encounter (Signed)
Will have Dr. Alyson Ingles sign when he is back in office and I will fax back.

## 2021-08-11 ENCOUNTER — Other Ambulatory Visit: Payer: Self-pay | Admitting: Urology

## 2021-08-11 ENCOUNTER — Telehealth: Payer: Self-pay | Admitting: Nurse Practitioner

## 2021-08-11 NOTE — Telephone Encounter (Signed)
She will have to reach out to [facility] about that question.

## 2021-08-11 NOTE — Telephone Encounter (Signed)
I just pulled her readings but I think her hyperglycemia is more related to her recent surgery.  I see where she went to the ED on 7/15 for similar complaint.  Usually it takes some time to get used to the pump and I can actually see an improvement in her glucose over the last 2-3 days.  I do not recommend adjusting setting just yet.  Have her reach out in another week if glucose doesn't level out a bit.

## 2021-08-11 NOTE — Telephone Encounter (Signed)
Patient said her readings have been high since being on the pump. Can you pull her readings on dexcom

## 2021-08-11 NOTE — Telephone Encounter (Signed)
Pt aware. Is the insulin pump suppose to have insulin on board all day long? She thinks right now she gets 200 units per thing. Please advise

## 2021-08-11 NOTE — Telephone Encounter (Signed)
Left VM

## 2021-08-12 ENCOUNTER — Other Ambulatory Visit: Payer: Self-pay | Admitting: Urology

## 2021-08-12 ENCOUNTER — Encounter (HOSPITAL_COMMUNITY)
Admission: RE | Admit: 2021-08-12 | Discharge: 2021-08-12 | Disposition: A | Discharge status: Home / Self Care | Payer: Medicaid Other | Source: Ambulatory Visit | Attending: Urology | Admitting: Urology

## 2021-08-12 ENCOUNTER — Other Ambulatory Visit: Payer: Self-pay

## 2021-08-12 NOTE — Telephone Encounter (Signed)
My chart message sent to MD per patient request for pain medication refill.

## 2021-08-12 NOTE — Telephone Encounter (Signed)
Please refill if appropriate

## 2021-08-13 ENCOUNTER — Telehealth: Payer: Self-pay

## 2021-08-13 ENCOUNTER — Other Ambulatory Visit: Payer: Self-pay | Admitting: Urology

## 2021-08-13 MED ORDER — HYDROMORPHONE HCL 4 MG PO TABS: 4.0000 mg | ORAL_TABLET | ORAL | 0 refills | Status: DC | PRN

## 2021-08-13 NOTE — Telephone Encounter (Signed)
Please refill if appropriate

## 2021-08-13 NOTE — Telephone Encounter (Signed)
Patient called office at 4:45 to discuss insulin pump instructions. Per patient she has been unable to reach pre op to discuss insulin changes.   Reviewed with Dr .Alyson Ingles, who recommends at this time keeping insulin pump at normal rate with no increase. Patient voiced understanding.

## 2021-08-14 ENCOUNTER — Ambulatory Visit (HOSPITAL_COMMUNITY)
Admission: RE | Admit: 2021-08-14 | Discharge: 2021-08-14 | Disposition: A | Discharge status: Home / Self Care | Payer: Medicaid Other | Attending: Urology | Admitting: Urology

## 2021-08-14 ENCOUNTER — Ambulatory Visit (HOSPITAL_COMMUNITY): Payer: Medicaid Other | Admitting: Anesthesiology

## 2021-08-14 ENCOUNTER — Ambulatory Visit (HOSPITAL_COMMUNITY): Payer: Medicaid Other

## 2021-08-14 ENCOUNTER — Encounter (HOSPITAL_COMMUNITY)
Admission: RE | Disposition: A | Discharge status: Home / Self Care | Payer: Self-pay | Source: Home / Self Care | Attending: Urology

## 2021-08-14 ENCOUNTER — Telehealth: Payer: Self-pay

## 2021-08-14 ENCOUNTER — Ambulatory Visit (HOSPITAL_BASED_OUTPATIENT_CLINIC_OR_DEPARTMENT_OTHER): Payer: Medicaid Other | Admitting: Anesthesiology

## 2021-08-14 ENCOUNTER — Encounter (HOSPITAL_COMMUNITY): Payer: Self-pay | Admitting: Urology

## 2021-08-14 DIAGNOSIS — I10 Essential (primary) hypertension: Secondary | ICD-10-CM

## 2021-08-14 DIAGNOSIS — N2889 Other specified disorders of kidney and ureter: Secondary | ICD-10-CM | POA: Diagnosis not present

## 2021-08-14 DIAGNOSIS — Z87891 Personal history of nicotine dependence: Secondary | ICD-10-CM

## 2021-08-14 DIAGNOSIS — N131 Hydronephrosis with ureteral stricture, not elsewhere classified: Secondary | ICD-10-CM | POA: Insufficient documentation

## 2021-08-14 DIAGNOSIS — N1330 Unspecified hydronephrosis: Secondary | ICD-10-CM | POA: Diagnosis not present

## 2021-08-14 DIAGNOSIS — I2510 Atherosclerotic heart disease of native coronary artery without angina pectoris: Secondary | ICD-10-CM | POA: Diagnosis not present

## 2021-08-14 DIAGNOSIS — K219 Gastro-esophageal reflux disease without esophagitis: Secondary | ICD-10-CM | POA: Diagnosis not present

## 2021-08-14 DIAGNOSIS — E119 Type 2 diabetes mellitus without complications: Secondary | ICD-10-CM | POA: Diagnosis not present

## 2021-08-14 HISTORY — PX: URETERAL BIOPSY: SHX6688

## 2021-08-14 HISTORY — PX: CYSTOSCOPY WITH RETROGRADE PYELOGRAM, URETEROSCOPY AND STENT PLACEMENT: SHX5789

## 2021-08-14 LAB — GLUCOSE, CAPILLARY
Glucose-Capillary: 105 mg/dL — ABNORMAL HIGH (ref 70–99)
Glucose-Capillary: 123 mg/dL — ABNORMAL HIGH (ref 70–99)

## 2021-08-14 SURGERY — CYSTOURETEROSCOPY, WITH RETROGRADE PYELOGRAM AND STENT INSERTION
Anesthesia: General | Site: Ureter | Laterality: Right

## 2021-08-14 MED ORDER — FENTANYL CITRATE (PF) 100 MCG/2ML IJ SOLN
INTRAMUSCULAR | Status: AC
  Filled 2021-08-14: qty 2

## 2021-08-14 MED ORDER — LIDOCAINE HCL (PF) 2 % IJ SOLN
INTRAMUSCULAR | Status: AC
  Filled 2021-08-14: qty 5

## 2021-08-14 MED ORDER — ONDANSETRON HCL 4 MG/2ML IJ SOLN
INTRAMUSCULAR | Status: AC
  Filled 2021-08-14: qty 2

## 2021-08-14 MED ORDER — PHENYLEPHRINE 80 MCG/ML (10ML) SYRINGE FOR IV PUSH (FOR BLOOD PRESSURE SUPPORT)
PREFILLED_SYRINGE | INTRAVENOUS | Status: AC
  Filled 2021-08-14: qty 10

## 2021-08-14 MED ORDER — ONDANSETRON HCL 4 MG/2ML IJ SOLN: 4.0000 mg | Freq: Once | INTRAMUSCULAR | Status: DC | PRN

## 2021-08-14 MED ORDER — DEXMEDETOMIDINE HCL IN NACL 80 MCG/20ML IV SOLN
INTRAVENOUS | Status: AC
  Filled 2021-08-14: qty 20

## 2021-08-14 MED ORDER — FENTANYL CITRATE PF 50 MCG/ML IJ SOSY
PREFILLED_SYRINGE | INTRAMUSCULAR | Status: AC
  Filled 2021-08-14: qty 1

## 2021-08-14 MED ORDER — WATER FOR IRRIGATION, STERILE IR SOLN
Status: DC | PRN
  Administered 2021-08-14: 500 mL

## 2021-08-14 MED ORDER — FENTANYL CITRATE PF 50 MCG/ML IJ SOSY
50.0000 ug | PREFILLED_SYRINGE | INTRAMUSCULAR | Status: DC | PRN
  Administered 2021-08-14: 50 ug via INTRAVENOUS

## 2021-08-14 MED ORDER — CEFAZOLIN SODIUM-DEXTROSE 2-4 GM/100ML-% IV SOLN
2.0000 g | INTRAVENOUS | Status: AC
  Administered 2021-08-14: 2 g via INTRAVENOUS
  Filled 2021-08-14: qty 100

## 2021-08-14 MED ORDER — EPHEDRINE SULFATE (PRESSORS) 50 MG/ML IJ SOLN
INTRAMUSCULAR | Status: DC | PRN
  Administered 2021-08-14: 5 mg via INTRAVENOUS
  Administered 2021-08-14: 10 mg via INTRAVENOUS
  Administered 2021-08-14: 5 mg via INTRAVENOUS

## 2021-08-14 MED ORDER — PROPOFOL 10 MG/ML IV BOLUS
INTRAVENOUS | Status: AC
  Filled 2021-08-14: qty 20

## 2021-08-14 MED ORDER — HYDROMORPHONE HCL 1 MG/ML IJ SOLN: 0.2500 mg | INTRAMUSCULAR | Status: DC | PRN

## 2021-08-14 MED ORDER — DEXAMETHASONE SODIUM PHOSPHATE 10 MG/ML IJ SOLN
INTRAMUSCULAR | Status: AC
  Filled 2021-08-14: qty 1

## 2021-08-14 MED ORDER — DEXAMETHASONE SODIUM PHOSPHATE 10 MG/ML IJ SOLN
INTRAMUSCULAR | Status: DC | PRN
  Administered 2021-08-14: 10 mg via INTRAVENOUS

## 2021-08-14 MED ORDER — PHENYLEPHRINE HCL (PRESSORS) 10 MG/ML IV SOLN
INTRAVENOUS | Status: DC | PRN
  Administered 2021-08-14: 80 ug via INTRAVENOUS
  Administered 2021-08-14: 160 ug via INTRAVENOUS
  Administered 2021-08-14 (×3): 80 ug via INTRAVENOUS

## 2021-08-14 MED ORDER — PROPOFOL 10 MG/ML IV BOLUS
INTRAVENOUS | Status: DC | PRN
  Administered 2021-08-14: 170 mg via INTRAVENOUS

## 2021-08-14 MED ORDER — DIATRIZOATE MEGLUMINE 30 % UR SOLN
URETHRAL | Status: DC | PRN
  Administered 2021-08-14: 8 mL via URETHRAL

## 2021-08-14 MED ORDER — EPHEDRINE 5 MG/ML INJ
INTRAVENOUS | Status: AC
  Filled 2021-08-14: qty 5

## 2021-08-14 MED ORDER — GLYCOPYRROLATE PF 0.2 MG/ML IJ SOSY
PREFILLED_SYRINGE | INTRAMUSCULAR | Status: AC
  Filled 2021-08-14: qty 1

## 2021-08-14 MED ORDER — MIDAZOLAM HCL 2 MG/2ML IJ SOLN
INTRAMUSCULAR | Status: AC
  Filled 2021-08-14: qty 2

## 2021-08-14 MED ORDER — LACTATED RINGERS IV SOLN: INTRAVENOUS | Status: DC

## 2021-08-14 MED ORDER — LIDOCAINE HCL (CARDIAC) PF 100 MG/5ML IV SOSY
PREFILLED_SYRINGE | INTRAVENOUS | Status: DC | PRN
  Administered 2021-08-14: 80 mg via INTRAVENOUS

## 2021-08-14 MED ORDER — DIATRIZOATE MEGLUMINE 30 % UR SOLN
URETHRAL | Status: AC
  Filled 2021-08-14: qty 100

## 2021-08-14 MED ORDER — SODIUM CHLORIDE 0.9 % IR SOLN
Status: DC | PRN
  Administered 2021-08-14 (×2): 3000 mL

## 2021-08-14 MED ORDER — ROCURONIUM BROMIDE 10 MG/ML (PF) SYRINGE
PREFILLED_SYRINGE | INTRAVENOUS | Status: AC
  Filled 2021-08-14: qty 10

## 2021-08-14 MED ORDER — HYDROMORPHONE HCL 4 MG PO TABS
4.0000 mg | ORAL_TABLET | ORAL | 0 refills | Status: DC | PRN
Start: 2021-08-14 — End: 2021-08-25

## 2021-08-14 MED ORDER — ONDANSETRON HCL 4 MG/2ML IJ SOLN
INTRAMUSCULAR | Status: DC | PRN
  Administered 2021-08-14: 4 mg via INTRAVENOUS

## 2021-08-14 MED ORDER — MIDAZOLAM HCL 5 MG/5ML IJ SOLN
INTRAMUSCULAR | Status: DC | PRN
  Administered 2021-08-14: 2 mg via INTRAVENOUS

## 2021-08-14 SURGICAL SUPPLY — 23 items
BAG DRAIN URO TABLE W/ADPT NS (BAG) ×2 IMPLANT
BAG DRN 8 ADPR NS SKTRN CSTL (BAG) ×1
BAG HAMPER (MISCELLANEOUS) ×2 IMPLANT
BRUSH URET BIOPSY 3F (UROLOGICAL SUPPLIES) ×1 IMPLANT
CATH INTERMIT  6FR 70CM (CATHETERS) ×2 IMPLANT
CLOTH BEACON ORANGE TIMEOUT ST (SAFETY) ×2 IMPLANT
GLOVE BIO SURGEON STRL SZ8 (GLOVE) ×2 IMPLANT
GLOVE BIOGEL PI IND STRL 7.0 (GLOVE) ×2 IMPLANT
GLOVE BIOGEL PI INDICATOR 7.0 (GLOVE) ×2
GOWN STRL REUS W/TWL LRG LVL3 (GOWN DISPOSABLE) ×2 IMPLANT
GOWN STRL REUS W/TWL XL LVL3 (GOWN DISPOSABLE) ×2 IMPLANT
GUIDEWIRE STR DUAL SENSOR (WIRE) ×2 IMPLANT
GUIDEWIRE STR ZIPWIRE 035X150 (MISCELLANEOUS) ×2 IMPLANT
IV NS IRRIG 3000ML ARTHROMATIC (IV SOLUTION) ×4 IMPLANT
KIT TURNOVER CYSTO (KITS) ×2 IMPLANT
MANIFOLD NEPTUNE II (INSTRUMENTS) ×2 IMPLANT
PACK CYSTO (CUSTOM PROCEDURE TRAY) ×2 IMPLANT
PAD ARMBOARD 7.5X6 YLW CONV (MISCELLANEOUS) ×2 IMPLANT
STENT URET 6FRX24 CONTOUR (STENTS) ×1 IMPLANT
SYR 10ML LL (SYRINGE) ×2 IMPLANT
SYR CONTROL 10ML LL (SYRINGE) ×2 IMPLANT
TOWEL OR 17X26 4PK STRL BLUE (TOWEL DISPOSABLE) ×2 IMPLANT
WATER STERILE IRR 500ML POUR (IV SOLUTION) ×2 IMPLANT

## 2021-08-14 NOTE — Interval H&P Note (Signed)
History and Physical Interval Note:  08/14/2021 7:34 AM  Sandra [name]  has presented today for surgery, with the diagnosis of right ureteral stricture.  The various methods of treatment have been discussed with the patient and family. After consideration of risks, benefits and other options for treatment, the patient has consented to  Procedure(s): CYSTOSCOPY WITH RETROGRADE PYELOGRAM, URETEROSCOPY AND STENT EXCHANGE (Right) URETERAL BIOPSY (Right) as a surgical intervention.  The patient's history has been reviewed, patient examined, no change in status, stable for surgery.  I have reviewed the patient's chart and labs.  Questions were answered to the patient's satisfaction.     [name]

## 2021-08-14 NOTE — Anesthesia Procedure Notes (Addendum)
Procedure Name: LMA Insertion Date/Time: 08/14/2021 7:47 AM  Performed by: Lieutenant Diego, CRNAPre-anesthesia Checklist: Patient identified, Emergency Drugs available, Suction available and Patient being monitored Patient Re-evaluated:Patient Re-evaluated prior to induction Oxygen Delivery Method: Circle system utilized Preoxygenation: Pre-oxygenation with 100% oxygen Induction Type: IV induction Ventilation: Mask ventilation without difficulty LMA: LMA inserted LMA Size: 4.0 Number of attempts: 1 Placement Confirmation: positive ETCO2 Tube secured with: Tape Dental Injury: Teeth and Oropharynx as per pre-operative assessment

## 2021-08-14 NOTE — Telephone Encounter (Signed)
Patient sister called stating that pt wiped herself after voiding and started bleeding.  The bleeding has since stopped. Sister states that pt is taking pain med q 4hrs with minimal relief. Is anything else pt can take for pain?

## 2021-08-14 NOTE — Anesthesia Preprocedure Evaluation (Signed)
Anesthesia Evaluation  Patient identified by MRN, date of birth, ID band Patient awake    Reviewed: Allergy & Precautions, H&P , NPO status , Patient's Chart, lab work & pertinent test results, reviewed documented beta blocker date and time   Airway Mallampati: II  TM Distance: >3 FB Neck ROM: full    Dental no notable dental hx.    Pulmonary neg pulmonary ROS, former smoker,    Pulmonary exam normal breath sounds clear to auscultation       Cardiovascular Exercise Tolerance: Good hypertension, + CAD  negative cardio ROS   Rhythm:regular Rate:Normal  IMPRESSIONS Septal hypokinesis inferior basal akinesis . Left ventricular ejection fraction, by estimation, is 50 to 55%. The left ventricle has low normal function. The left ventricle demonstrates regional wall motion abnormalities (see scoring diagram/findings for description). Left ventricular diastolic parameters were normal. 1. 2. Right ventricular systolic function is normal. The right ventricular size is normal. The mitral valve is abnormal. Trivial mitral valve regurgitation. No evidence of mitral stenosis. 3. The aortic valve is tricuspid. Aortic valve regurgitation is not visualized. No aortic stenosis is present. 4. The inferior vena cava is normal in size with greater than 50% respiratory variability, suggesting right atrial pressure of 3 mmHg.   Neuro/Psych PSYCHIATRIC DISORDERS Anxiety Depression Bipolar Disorder  Neuromuscular disease    GI/Hepatic Neg liver ROS, GERD  Medicated,  Endo/Other  negative endocrine ROSdiabetes, Type 2  Renal/GU Renal diseasenegative Renal ROS  negative genitourinary   Musculoskeletal negative musculoskeletal ROS (+)   Abdominal   Peds  Hematology negative hematology ROS (+) Blood dyscrasia, anemia ,   Anesthesia Other Findings 1. Septal hypokinesis inferior basal akinesis . Left ventricular ejection  fraction, by  estimation, is 50 to 55%. The left ventricle has low normal  function. The left ventricle demonstrates regional wall motion  abnormalities (see scoring diagram/findings  for description). Left ventricular diastolic parameters were normal.  2. Right ventricular systolic function is normal. The right ventricular  size is normal.  3. The mitral valve is abnormal. Trivial mitral valve regurgitation. No  evidence of mitral stenosis.  4. The aortic valve is tricuspid. Aortic valve regurgitation is not  visualized. No aortic stenosis is present.  5. The inferior vena cava is normal in size with greater than 50%  respiratory variability, suggesting right atrial pressure of 3 mmHg.   Reproductive/Obstetrics negative OB ROS                             Anesthesia Physical  Anesthesia Plan  ASA: 3  Anesthesia Plan: General and General LMA   Post-op Pain Management:    Induction:   PONV Risk Score and Plan: Ondansetron  Airway Management Planned:   Additional Equipment:   Intra-op Plan:   Post-operative Plan:   Informed Consent: I have reviewed the patients History and Physical, chart, labs and discussed the procedure including the risks, benefits and alternatives for the proposed anesthesia with the patient or authorized representative who has indicated his/her understanding and acceptance.     Dental Advisory Given  Plan Discussed with: CRNA  Anesthesia Plan Comments:         Anesthesia Quick Evaluation

## 2021-08-14 NOTE — Transfer of Care (Signed)
Immediate Anesthesia Transfer of Care Note  Patient: Sandra [name]  Procedure(s) Performed: CYSTOSCOPY WITH RETROGRADE PYELOGRAM, URETEROSCOPY AND STENT EXCHANGE (Right: Ureter) URETERAL BIOPSY (Right: Ureter)  Patient Location: PACU  Anesthesia Type:General  Level of Consciousness: drowsy  Airway & Oxygen Therapy: Patient Spontanous Breathing and Patient connected to face mask oxygen  Post-op Assessment: Report given to RN and Post -op Vital signs reviewed and stable  Post vital signs: Reviewed and stable  Last Vitals:  Vitals Value Taken Time  BP 136/71   Temp 97.7   Pulse 81   Resp 14   SpO2 100%     Last Pain:  Vitals:   08/14/21 0649  TempSrc: Oral  PainSc: 9       Patients Stated Pain Goal: 8 (55/21/74 7159)  Complications: No notable events documented.

## 2021-08-14 NOTE — Op Note (Signed)
Preoperative diagnosis: Right hydronephrosis  Postoperative diagnosis: Same  Procedure: 1 cystoscopy 2.  right retrograde pyelography 3.  Intraoperative fluoroscopy, under one hour, with interpretation 4.  Right diagnostic ureteroscopy with brush biopsy of the right distal ureter 5. 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 She underwent right stent placement 2 weeks ago.  After discussing treatment options, she decided proceed with right diagnostic ureteroscopy and possible ureteral biopsy.  Procedure in detail: The patient was brought to the operating room and a brief timeout was done to ensure correct patient, correct procedure, correct site.  General anesthesia was administered patient was placed in dorsal lithotomy position.  Her genitalia was then prepped and draped in usual sterile fashion.  A rigid 4 French cystoscope was passed in the urethra and the bladder.  Bladder was inspected free masses or lesions.  the right ureteral orifices were in the normal orthotopic locations. a 6 french ureteral catheter was then instilled into the right ureter orifice.    a gentle retrograde was obtained and findings noted above. Using a grasper the right ureteral stent was brought to the urethral meatus. we then placed a zip wire through the ureteral stent and advanced up to the renal pelvis. The stent was then removed. we then removed the cystoscope and cannulated the right ureteral orifice with a semirigid ureteroscope.  We encountered erythema of the distal ureter and obtained a brush  biopsy of that area. we then performed ureteroscopy up to the level of the UPJ. No other stone or tumor was encountered. We then elected to place a ureteral stent over the original zipwire. We advanced a 6x24 JJ ureteral stent over the wire and up to the renal pelvis. We removed the wire and good coil was noted in the renal pelvis under fluoroscopy and the bladder under direct vision.  the bladder was then drained and this concluded the procedure which was well tolerated by patient.  Complications: None  Condition: Stable, extubated, transferred to PACU  Plan: Pt is to be discharged home and followup in 1 week for pathology discussion. She is to remove her stent in 72 hours by pulling the tether

## 2021-08-14 NOTE — Progress Notes (Signed)
Pt made aware to call [employer]  (provided number) and request new monitor per radiology in OR. Pt verbalizes understanding.

## 2021-08-15 ENCOUNTER — Encounter (HOSPITAL_COMMUNITY): Payer: Self-pay | Admitting: Urology

## 2021-08-15 LAB — SURGICAL PATHOLOGY

## 2021-08-15 NOTE — Anesthesia Postprocedure Evaluation (Signed)
Anesthesia Post Note  Patient: Sandra [name]  Procedure(s) Performed: CYSTOSCOPY WITH RETROGRADE PYELOGRAM, URETEROSCOPY AND STENT EXCHANGE (Right: Ureter) URETERAL BIOPSY (Right: Ureter)  Patient location during evaluation: Phase II Anesthesia Type: General Level of consciousness: awake Pain management: pain level controlled Vital Signs Assessment: post-procedure vital signs reviewed and stable Respiratory status: spontaneous breathing and respiratory function stable Cardiovascular status: blood pressure returned to baseline and stable Postop Assessment: no headache and no apparent nausea or vomiting Anesthetic complications: no Comments: Late entry   No notable events documented.   Last Vitals:  Vitals:   08/14/21 0915 08/14/21 0920  BP: 125/79 (!) 149/80  Pulse: 84 87  Resp: 16 18  Temp:    SpO2: 94% 99%    Last Pain:  Vitals:   08/15/21 0921  TempSrc:   PainSc: 0-No pain                 Louann Sjogren

## 2021-08-15 NOTE — Telephone Encounter (Signed)
Patient states that she thinks that she has pulled some of the string to the stent out to far.  She is having increased urination and is filling 12 briefs a day and is having a lot of pain.  She is concerned she has messed something up.  Please advise.

## 2021-08-15 NOTE — Progress Notes (Signed)
PT called for post op call, daughter answered phone and states that pt accidentally pulled out stent last night. States they are going to come to the hospital today. This RN informed pt that she needs to call Dr. Alyson Ingles office and make them aware. This RN also asked pt to call me back to make sure she spoke with someone so that Dr. Is aware. Pt denies any other issues.

## 2021-08-15 NOTE — Telephone Encounter (Signed)
Per Dr. Alyson Ingles pt can double up on pain medication to help with pain and also pull stent if it would make her mor comfortable. Patient made aware per other office employee.

## 2021-08-20 NOTE — Telephone Encounter (Signed)
Received a verbal order form from the triage nurse informing that the patient was experiencing  nausea and triage nurse order Phenergan for nausea and will have Dr.McKenzie sign order when he return to office.

## 2021-08-25 ENCOUNTER — Ambulatory Visit (INDEPENDENT_AMBULATORY_CARE_PROVIDER_SITE_OTHER): Payer: Medicaid Other | Admitting: Urology

## 2021-08-25 ENCOUNTER — Encounter: Payer: Self-pay | Admitting: Urology

## 2021-08-25 VITALS — BP 160/94 | HR 91 | Ht 60.0 in | Wt 180.0 lb

## 2021-08-25 DIAGNOSIS — N1330 Unspecified hydronephrosis: Secondary | ICD-10-CM | POA: Diagnosis not present

## 2021-08-25 MED ORDER — HYDROMORPHONE HCL 4 MG PO TABS: 4.0000 mg | ORAL_TABLET | ORAL | 0 refills | Status: DC | PRN

## 2021-08-25 NOTE — Progress Notes (Signed)
08/25/2021 3:15 PM   Sandra [name] 02/23/1975 [id number]  Referring provider: [place] [facility], [address] [place],  [place] [address]  Followup right hydronephrosis   HPI: Sandra [name] is a 46yo here for followup after right ureteral biopsy and right ureteral stricture. Pathology from biopsy was benign. She had urinary incontinence and pain with the right ureteral stent in place and removed it POD#1.  She continues to have right flank pain that is sharp, intermittent, mild to moderate and nonraditing.    PMH: Past Medical History:  Diagnosis Date   ADHD (attention deficit hyperactivity disorder)    Allergic rhinitis    Anemia    hx of   Barrett's esophagus    Bipolar 1 disorder (HCC)    Bladder tumor    CAD (coronary artery disease)    Cancer ([place])    bladder   Constipation    Dilated cardiomyopathy ([place]) 12/20/2020   Echo 6/22 ([facility]): EF 43, mild MR, mild TR // Echo 3/22: EF 50-55   Dizziness    Full dentures    GERD (gastroesophageal reflux disease)    Gross hematuria    Hyperlipidemia    Hypertension    Stable angina (HCC)    Type 2 diabetes mellitus ([place])    Urgency of urination    dysuria, sui   Wears glasses     Surgical History: Past Surgical History:  Procedure Laterality Date   BALLOON DILATION N/A 02/17/2021   Procedure: BALLOON DILATION;  Surgeon: Eloise Harman, DO;  [facility];  Service: Endoscopy;  Laterality: N/A;   BIOPSY  02/17/2021   Procedure: BIOPSY;  Surgeon: Eloise Harman, DO;  [facility];  Service: Endoscopy;;   COLONOSCOPY WITH PROPOFOL N/A 02/17/2021   Surgeon: Eloise Harman, DO; nonbleeding internal hemorrhoids, otherwise normal exam.  Repeat in 10 years.   CYSTOSCOPY N/A 10/03/2015   Procedure: CYSTOSCOPY;  Surgeon: Irine Seal, MD;  Location: [facility];  Service: Urology;  Laterality: N/A;   CYSTOSCOPY W/ URETERAL STENT PLACEMENT Right 07/24/2021   Procedure: CYSTOSCOPY WITH  RETROGRADE PYELOGRAM/URETERAL STENT PLACEMENT;  Surgeon: Cleon Gustin, MD;  [facility]: [facility];  Service: Urology;  Laterality: Right;   CYSTOSCOPY WITH RETROGRADE PYELOGRAM, URETEROSCOPY AND STENT PLACEMENT Right 08/14/2021   Procedure: CYSTOSCOPY WITH RETROGRADE PYELOGRAM, URETEROSCOPY AND STENT EXCHANGE;  Surgeon: Cleon Gustin, MD;  [facility];  Service: Urology;  Laterality: Right;   CYSTOSCOPY WITH STENT PLACEMENT Right 10/31/2015   Procedure: CYSTOSCOPY WITH ATTEMPTED RIGHT URETERAL OPENING;  Surgeon: Irine Seal, MD;  Location: [facility];  Service: Urology;  Laterality: Right;   ESOPHAGOGASTRODUODENOSCOPY (EGD) WITH PROPOFOL N/A 02/17/2021   Surgeon: Eloise Harman, DO; 2 cm hiatal hernia, Barrett's esophagus, gastritis with biopsies negative for H. pylori.  S/p empiric esophageal dilation.  Repeat in 3 years.   FOOT SURGERY Left    September 2022, broke 7 bones in left foot   INTERSTIM IMPLANT PLACEMENT N/A 03/14/2018   Procedure: Barrie Lyme IMPLANT FIRST STAGE;  Surgeon: Cleon Gustin, MD;  [facility];  Service: Urology;  Laterality: N/A;   INTERSTIM IMPLANT PLACEMENT N/A 03/30/2018   Procedure: Barrie Lyme IMPLANT SECOND STAGE;  Surgeon: Cleon Gustin, MD;  [facility];  Service: Urology;  Laterality: N/A;   LEFT HEART CATH AND CORONARY ANGIOGRAPHY N/A 05/31/2020   Procedure: LEFT HEART CATH AND CORONARY ANGIOGRAPHY;  Surgeon: Troy Sine, MD;  Location: [facility]  INVASIVE CV LAB;  Service: Cardiovascular;  Laterality: N/A;   MULTIPLE TOOTH EXTRACTIONS  2015   OVARIAN CYST REMOVAL Right 2004 approx   SHOULDER OPEN ROTATOR CUFF REPAIR Left 04/20/2019   Procedure: ROTATOR CUFF REPAIR SHOULDER OPEN;  Surgeon: Carole Civil, MD;  Location[facility];  Service: Orthopedics;  Laterality: Left;   TONSILLECTOMY  12/30/2004   TOTAL ABDOMINAL HYSTERECTOMY W/ BILATERAL SALPINGOOPHORECTOMY  05/16/2004   TRANSURETHRAL RESECTION OF BLADDER TUMOR  N/A 10/03/2015   Procedure: TRANSURETHRAL RESECTION OF BLADDER TUMOR (TURBT);  Surgeon: Irine Seal, MD;  Location: [facility];  Service: Urology;  Laterality: N/A;   TRANSURETHRAL RESECTION OF BLADDER TUMOR N/A 10/31/2015   Procedure: RE-STAGINGG TRANSURETHRAL RESECTION OF BLADDER TUMOR (TURBT);  Surgeon: Irine Seal, MD;  Location: [facility];  Service: Urology;  Laterality: N/A;   TRANSURETHRAL RESECTION OF BLADDER TUMOR N/A 08/17/2016   Procedure: TRANSURETHRAL RESECTION OF BLADDER TUMOR (TURBT);  Surgeon: Cleon Gustin, MD;  [facility];  Service: Urology;  Laterality: N/A;   URETERAL BIOPSY Right 08/14/2021   Procedure: URETERAL BIOPSY;  Surgeon: Cleon Gustin, MD;  [facility];  Service: Urology;  Laterality: Right;    Home Medications:  Allergies as of 08/25/2021       Reactions   Ibuprofen Hives   itching itching itching   Benazepril Hives   Diphenhydramine Hives   Other reaction(s): hives Other reaction(s): hives   Latex Dermatitis   States she had redness to skin and itching    Metronidazole Nausea And Vomiting   Other reaction(s): nausea Other reaction(s): nausea   Oxycodone    Pt has tolerated hydromorphone in the past and takes Norco at home.   Toradol [ketorolac Tromethamine] Itching   Adhesive [tape] Rash   Codeine Nausea And Vomiting, Rash   Other reaction(s): hives   Loratadine Rash   Nystatin-triamcinolone Rash   Penicillins Rash   Has patient had a PCN reaction causing immediate rash, facial/tongue/throat swelling, SOB or lightheadedness with hypotension: No Has patient had a PCN reaction causing severe rash involving mucus membranes or skin necrosis: Yes Has patient had a PCN reaction that required hospitalization: No Has patient had a PCN reaction occurring within the last 10 years: no If all of the above answers are "NO", then may proceed with Cephalosporin use. Other reaction(s): nausea Received ancef  w/o issue (7/23)        Medication List        Accurate as of August 25, 2021  3:15 PM. If you have any questions, ask your nurse or doctor.          Accu-Chek Guide test strip Generic drug: glucose blood USE AS INSTRUCTED TO MONITOR GLUCOSE 4 TIMES DAILY BEFORE MEALS AND BEFORE BED.   Accu-Chek Guide w/Device Kit 1 Piece by Does not apply route as directed.   Accu-Chek Softclix Lancets lancets Use as instructed to monitor glucose 4 times daily   albuterol 108 (90 Base) MCG/ACT inhaler Commonly known as: ProAir HFA Inhale 2 puffs into the lungs every 6 (six) hours as needed for wheezing or shortness of breath.   aspirin EC 81 MG tablet Take 1 tablet (81 mg total) by mouth daily with breakfast. Swallow whole.   Blood Pressure Cuff Misc See admin instructions.   buPROPion 300 MG 24 hr tablet Commonly known as: WELLBUTRIN XL Take 300 mg by mouth every morning.   cloNIDine HCl 0.1 MG Tb12 ER tablet Commonly known as: KAPVAY 1  tablet at bedtime.   Dexcom G6 Sensor Misc Apply new sensor every 10 days as directed   Dexcom G6 Transmitter Misc 1 Device by Does not apply route every 3 (three) months.   fenofibrate 145 MG tablet Commonly known as: TRICOR Take 1 tablet (145 mg total) by mouth daily.   FLUoxetine 40 MG capsule Commonly known as: PROZAC Take 40 mg by mouth daily.   gabapentin 300 MG capsule Commonly known as: NEURONTIN TAKE 1 CAPSULE(300 MG) BY MOUTH THREE TIMES DAILY What changed: See the new instructions.   HYDROcodone-acetaminophen 5-325 MG tablet Commonly known as: NORCO/VICODIN Take 1 tablet by mouth every 6 (six) hours as needed.   hydrocortisone 2.5 % rectal cream Commonly known as: ANUSOL-HC Place 1 application rectally 2 (two) times daily.   HYDROmorphone 4 MG tablet Commonly known as: DILAUDID Take 1 tablet (4 mg total) by mouth every 4 (four) hours as needed for severe pain.   HYDROmorphone 4 MG tablet Commonly known as:  DILAUDID TAKE 1 TABLET EVERY 4 HOURS AS NEEDED FOR SEVERE PAIN.   hydrOXYzine 25 MG tablet Commonly known as: ATARAX Take 1 tablet (25 mg total) by mouth 3 (three) times daily as needed for itching or anxiety.   insulin aspart 100 UNIT/ML injection Commonly known as: NovoLOG Use with Omnipod for TDD around 80 units daily   INSULIN SYRINGE 1CC/29G 29G X 1/2" 1 ML Misc Use to inject insulin into Omnipod   levofloxacin 750 MG tablet Commonly known as: Levaquin Take 1 tablet (750 mg total) by mouth daily.   linaclotide 72 MCG capsule Commonly known as: Linzess Take 1 capsule (72 mcg total) by mouth daily before breakfast.   nitroGLYCERIN 0.4 MG SL tablet Commonly known as: NITROSTAT Place 1 tablet (0.4 mg total) under the tongue every 5 (five) minutes as needed for chest pain.   Omnipod DASH Pods (Gen 4) Misc Change pod every 48-72 hrs as directed   pantoprazole 40 MG tablet Commonly known as: PROTONIX Take 1 tablet (40 mg total) by mouth 2 (two) times daily before a meal.   Pen Needles 31G X 6 MM Misc Use to inject insulin 4 times daily   Global Ease Inject Pen Needles 31G X 5 MM Misc Generic drug: Insulin Pen Needle Inject into the skin.   Repatha SureClick 427 MG/ML Soaj Generic drug: Evolocumab Inject 1 Dose into the skin every 14 (fourteen) days.   rosuvastatin 40 MG tablet Commonly known as: CRESTOR Take 1 tablet (40 mg total) by mouth daily.   tamsulosin 0.4 MG Caps capsule Commonly known as: FLOMAX Take 1 capsule (0.4 mg total) by mouth at bedtime.   Vyvanse 40 MG capsule Generic drug: lisdexamfetamine Take 40 mg by mouth every morning.        Allergies:  Allergies  Allergen Reactions   Ibuprofen Hives    itching itching itching   Benazepril Hives   Diphenhydramine Hives    Other reaction(s): hives Other reaction(s): hives   Latex Dermatitis    States she had redness to skin and itching    Metronidazole Nausea And Vomiting    Other  reaction(s): nausea Other reaction(s): nausea   Oxycodone     Pt has tolerated hydromorphone in the past and takes Norco at home.   Toradol [Ketorolac Tromethamine] Itching   Adhesive [Tape] Rash   Codeine Nausea And Vomiting and Rash    Other reaction(s): hives   Loratadine Rash   Nystatin-Triamcinolone Rash   Penicillins Rash  Has patient had a PCN reaction causing immediate rash, facial/tongue/throat swelling, SOB or lightheadedness with hypotension: No Has patient had a PCN reaction causing severe rash involving mucus membranes or skin necrosis: Yes Has patient had a PCN reaction that required hospitalization: No Has patient had a PCN reaction occurring within the last 10 years: no  If all of the above answers are "NO", then may proceed with Cephalosporin use. Other reaction(s): nausea Received ancef w/o issue (7/23)    Family History: Family History  Problem Relation Age of Onset   Hypertension Mother    Cancer Mother    Breast cancer Maternal Aunt    Lung cancer Maternal Uncle    Cancer Other    Diabetes Daughter    Colon cancer Neg Hx     Social History:  reports that she quit smoking about 6 months ago. Her smoking use included cigarettes. She has a 5.00 pack-year smoking history. She has never used smokeless tobacco. She reports that she does not drink alcohol and does not use drugs.  ROS: All other review of systems were reviewed and are negative except what is noted above in HPI  Physical Exam: BP (!) 160/94   Pulse 91   Ht 5' (1.524 m)   Wt 180 lb (81.6 kg)   BMI 35.15 kg/m   Constitutional:  Alert and oriented, No acute distress. HEENT: [place] AT, moist mucus membranes.  Trachea midline, no masses. Cardiovascular: No clubbing, cyanosis, or edema. Respiratory: Normal respiratory effort, no increased work of breathing. GI: Abdomen is soft, nontender, nondistended, no abdominal masses GU: No CVA tenderness.  Lymph: No cervical or inguinal  lymphadenopathy. Skin: No rashes, bruises or suspicious lesions. Neurologic: Grossly intact, no focal deficits, moving all 4 extremities. Psychiatric: Normal mood and affect.  Laboratory Data: Lab Results  Component Value Date   WBC 12.4 (H) 07/26/2021   HGB 11.6 (L) 07/26/2021   HCT 37.9 07/26/2021   MCV 82.8 07/26/2021   PLT 384 07/26/2021    Lab Results  Component Value Date   CREATININE 1.03 (H) 07/26/2021    No results found for: "PSA"  No results found for: "TESTOSTERONE"  Lab Results  Component Value Date   HGBA1C 8.1 06/24/2021    Urinalysis    Component Value Date/Time   COLORURINE YELLOW 07/26/2021 1739   APPEARANCEUR CLEAR 07/26/2021 1739   APPEARANCEUR Clear 06/18/2021 1125   LABSPEC 1.022 07/26/2021 1739   PHURINE 7.0 07/26/2021 1739   GLUCOSEU >=500 (A) 07/26/2021 1739   HGBUR LARGE (A) 07/26/2021 1739   BILIRUBINUR NEGATIVE 07/26/2021 1739   BILIRUBINUR Negative 06/18/2021 1125   KETONESUR NEGATIVE 07/26/2021 1739   PROTEINUR 100 (A) 07/26/2021 1739   UROBILINOGEN 0.2 03/01/2011 1920   NITRITE NEGATIVE 07/26/2021 1739   LEUKOCYTESUR TRACE (A) 07/26/2021 1739    Lab Results  Component Value Date   LABMICR See below: 06/18/2021   WBCUA 0-5 06/18/2021   LABEPIT 0-10 06/18/2021   MUCUS Present 06/18/2021   BACTERIA NONE SEEN 07/26/2021    Pertinent Imaging:  No results found for this or any previous visit.  No results found for this or any previous visit.  No results found for this or any previous visit.  No results found for this or any previous visit.  Results for orders placed during the hospital encounter of 07/18/21  US RENAL  Narrative CLINICAL DATA:  Flank pain for 4 days.  EXAM: RENAL / URINARY TRACT ULTRASOUND COMPLETE  COMPARISON:  CT abdomen pelvis 07/18/2021.  FINDINGS: Right Kidney:  Renal measurements: 9.8 x 4.3 x 4.0 cm = volume: 89.5 mL. Parenchymal echogenicity within normal limits. Moderate  right hydronephrosis. Ureter cannot be followed. No right ureteral jet.  Left Kidney:  Renal measurements: 11.3 x 5.1 x 5.2 cm = volume: 157.5 mL. Echogenicity within normal limits. No mass or hydronephrosis visualized.  Bladder:  Again, right ureteral jet is not visualized.  Other:  None.  IMPRESSION: Right hydronephrosis, as on 07/18/2021, with nonvisualization of the right ureteral jet. An obstructing lesion cannot be excluded. Hematuria protocol CT without and with contrast may be helpful in further evaluation, as clinically indicated.   Electronically Signed By: Lorin Picket M.D. On: 07/21/2021 14:39  No results found for this or any previous visit.  No results found for this or any previous visit.  Results for orders placed during the hospital encounter of 07/26/21  CT Renal Stone Study  Narrative CLINICAL DATA:  Flank pain, kidney stone suspected Recent right-sided stent placement  EXAM: CT ABDOMEN AND PELVIS WITHOUT CONTRAST  TECHNIQUE: Multidetector CT imaging of the abdomen and pelvis was performed following the standard protocol without IV contrast.  RADIATION DOSE REDUCTION: This exam was performed according to the departmental dose-optimization program which includes automated exposure control, adjustment of the mA and/or kV according to patient size and/or use of iterative reconstruction technique.  COMPARISON:  CT 07/18/2021  FINDINGS: Lower chest: New patchy, ground-glass and nodular areas of airspace consolidation in the left lower lobe. No pleural effusion.  Hepatobiliary: No focal hepatic abnormality on this unenhanced exam. Liver is enlarged spanning 20 cm cranial caudal. Gallbladder physiologically distended, no calcified stone. No biliary dilatation.  Pancreas: No ductal dilatation or inflammation.  Spleen: Normal in size without focal abnormality.  Adrenals/Urinary Tract: Normal adrenal glands. Placement of  right nephroureteral stent. No stone or stone fragments are seen along the course of the stent. The right renal collecting system is decompressed. No right hydronephrosis. No significant perinephric edema. No intrarenal calculi. No left hydronephrosis. Partially distended urinary bladder. No bladder wall thickening.  Stomach/Bowel: Tiny hiatal hernia. No small bowel obstruction or inflammation. Moderate to large volume of stool in the ascending, transverse, and descending colon. Small volume of stool in the sigmoid colon colonic inflammation. The appendix is normal.  Vascular/Lymphatic: Aortic atherosclerosis without aneurysm. No portal venous or mesenteric gas. No bulky abdominopelvic adenopathy  Reproductive: Hysterectomy without adnexal mass.  Other: No free air or ascites. Pre sacral stimulator with battery pack on the left. Small fat containing umbilical hernia.  Musculoskeletal: There are no acute or suspicious osseous abnormalities. Transitional lumbosacral anatomy.  IMPRESSION: 1. Placement of right nephroureteral stent with decompression of the right renal collecting system. No stone or stone fragments are seen along the course of the stent. 2. New patchy, ground-glass and nodular areas of airspace consolidation in the left lower lobe, suspicious for pneumonia. 3. Moderate to large volume of stool in the colon, can be seen with constipation. 4. Hepatomegaly.  Aortic Atherosclerosis (ICD10-I70.0).   Electronically Signed By: Keith Rake M.D. On: 07/26/2021 19:46   Assessment & Plan:    1. Hydronephrosis, unspecified hydronephrosis type Repeat Ct stone study - Urinalysis, Routine w reflex microscopic   No follow-ups on file.  [name], MD  [facility] [place]

## 2021-08-26 ENCOUNTER — Ambulatory Visit: Payer: Medicaid Other | Admitting: Urology

## 2021-08-26 NOTE — Progress Notes (Signed)
Cardiology Office Note:    Date:  08/28/2021   ID:  Sandra [name], DOB 06-28-1975, MRN [id number]  PCP:  Coolidge Breeze, [place] Prov[facility] Cardiologist:  Jenkins Rouge, MD     Referring MD: Coolidge Breeze, FNP   Chief Complaint:  No chief complaint on file.     Patient Profile:   Sandra [name] is a 46 y.o. female with:  Coronary artery disease  Cath 5/22:  diffuse small vessel disease (diabetic); p-mRCA 70; occluded PDA w/ L-R collats; OM2 60 >> med Rx Myoview 6/22 ([facility]): no ischemia, EF 34 Myoview 2/22: no ischemia, EF 45, ?inf infarct  Echocardiogram 3/22: inf HK-AK, EF 50-55 Unable to get cMRI due to interstim implant  Hypertension  Hyperlipidemia  Diabetes mellitus  ADHD Bipolar d/o Bladder CA S/p interstim implant  GERD  Husband died due to COVID-19 in 09-03-22 Allergic to ACEi (urticaria with benazepril)   History of Present Illness: Ms. Sandra [name]  has small vessel diabetic disease with mod prox to mid RCA, OM2 disease and a chronically occluded PDA and L-R collaterals.  Her chest pain is managed medically.    Seen in [place] ER 02/07/21 She was concerned about aortic aneurysm but review of scan only shows mild aortic ectasia 3.9 cm    Sees Hilty for familial HLD. On repatha crestor and fibrates BS better controlled Smoking    Hospitalized July with intractable nausea Had right hydronephrosis with stenting by Dr Alyson Ingles 08/14/21 Biopsy pending  She adopted 2 daughters in past One died and she has her 21 yo grandson Sandra [name] living with her Other daughter with her at home as well  Rx for ? Pneumonia Finished antibiotics last week but still with cough    Prior CV studies: Myoview 06/21/20 ([facility]) No evidence of inducible myocardial ischemia or myocardial infarction. LVEF 34%.    Echocardiogram 06/19/20 ([facility]) EF 43, mild LVH, mild RV systolic dysfunction, mild MR, mild TR, trivial TR  Echocardiogram June 2023  Aorta 3.7 cm EF 50-55% improved    LEFT  HEART CATH AND CORONARY ANGIOGRAPHY 05/31/2020 RCA proximal 30, mid 70, distal 50; RPDA 100 LCx proximal 40, mid 20; OM1 30, OM2 60 LAD proximal 20, mid 34 RI 30 you to thank you    Echocardiogram 03/27/20 Severe inferior HK/AK, EF 50-55, mild LVH, normal RVSF, trivial MR   NM Myocar Multi W/Spect W/Wall Motion / EF 03/07/2020 No large ischemia, EF 45, low risk   Past Medical History:  Diagnosis Date   ADHD (attention deficit hyperactivity disorder)    Allergic rhinitis    Anemia    hx of   Barrett's esophagus    Bipolar 1 disorder (HCC)    Bladder tumor    CAD (coronary artery disease)    Cancer ([place])    bladder   Constipation    Dilated cardiomyopathy ([place]) 12/20/2020   Echo 6/22 ([facility]): EF 43, mild MR, mild TR // Echo 3/22: EF 50-55   Dizziness    Full dentures    GERD (gastroesophageal reflux disease)    Gross hematuria    Hyperlipidemia    Hypertension    Stable angina (HCC)    Type 2 diabetes mellitus (HCC)    Urgency of urination    dysuria, sui   Wears glasses    Current Medications: No outpatient medications have been marked as taking for the 08/28/21 encounter (Office Visit) with Josue Hector, MD.   Current Facility-Administered Medications for the  08/28/21 encounter (Office Visit) with Josue Hector, MD  Medication   betamethasone acetate-betamethasone sodium phosphate (CELESTONE) injection 3 mg    Allergies:   Ibuprofen, Benazepril, Diphenhydramine, Latex, Metronidazole, Oxycodone, Toradol [ketorolac tromethamine], Adhesive [tape], Codeine, Loratadine, Nystatin-triamcinolone, and Penicillins   Social History   Tobacco Use   Smoking status: Former    Packs/day: 0.50    Years: 10.00    Total pack years: 5.00    Types: Cigarettes    Quit date: 01/31/2021    Years since quitting: 0.5   Smokeless tobacco: Never   Tobacco comments:    02/06/21-no smoking past 5 days  Vaping Use   Vaping Use: Never used  Substance Use Topics   Alcohol use: No    Drug use: No    Family Hx: The patient's family history includes Breast cancer in her maternal aunt; Cancer in her mother and another family member; Diabetes in her daughter; Hypertension in her mother; Lung cancer in her maternal uncle. There is no history of Colon cancer.  ROS see HPI  EKGs/Labs/Other Test Reviewed:    EKG:  EKG is  ordered today.  The ekg ordered today demonstrates NSR, HR 74, normal axis, no ST-T wave changes, QTC 475  Recent Labs: 07/21/2021: Magnesium 1.9 07/26/2021: ALT 19; BUN 14; Creatinine, Ser 1.03; Hemoglobin 11.6; Platelets 384; Potassium 4.6; Sodium 133   Recent Lipid Panel Lab Results  Component Value Date/Time   CHOL 175 07/20/2021 07:59 AM   CHOL 246 (H) 06/05/2020 08:53 AM   TRIG 245 (H) 07/20/2021 07:59 AM   HDL 27 (L) 07/20/2021 07:59 AM   HDL 31 (L) 06/05/2020 08:53 AM   LDLCALC 99 07/20/2021 07:59 AM   LDLCALC 163 (H) 06/05/2020 08:53 AM     Risk Assessment/Calculations:          Physical Exam:    VS:  Pulse 88   Ht 5' (1.524 m)   Wt 178 lb 12.8 oz (81.1 kg)   SpO2 97%   BMI 34.92 kg/m     Wt Readings from Last 3 Encounters:  08/28/21 178 lb 12.8 oz (81.1 kg)  08/25/21 180 lb (81.6 kg)  07/26/21 179 lb (81.2 kg)    Affect appropriate Healthy:  appears stated age HEENT: normal xanthelasma  Neck supple with no adenopathy JVP normal no bruits no thyromegaly Lungs COPD with mild exp wheezing  Heart:  S1/S2 no murmur, no rub, gallop or click PMI normal Abdomen: benighn, BS positve, no tenderness, no AAA no bruit.  No HSM or HJR Distal pulses intact with no bruits No edema Neuro non-focal Skin warm and dry Xanthelasma over eyes  No muscular weakness   Plan:  Familial hypercholesterolemia She currently remains on rosuvastatin 40 mg daily, fenofibrate 145 mg daily, evolocumab 140 mg every 14 days.  Imporoved with LDL particle # 1216 and LDL 99 F/U Dr Julieanne Manson    CAD (coronary artery disease) Cardiac catheterization in 5/22  with diffuse small vessel diabetic disease.  She has moderate disease in the proximal to mid RCA and occluded PDA with left-to-right collaterals as well as moderate disease in OM 2.  She has been managed medically.  Metoprolol succinate  100 mg daily.  Continue amlodipine 5 mg daily, isosorbide mononitrate 60 mg daily, rosuvastatin 40 mg daily, aspirin 81 mg daily.     Essential hypertension, benign Blood pressure is well controlled on her current medical regimen which includes amlodipine, clonidine, isosorbide mononitrate, metoprolol succinate.   Dilated cardiomyopathy ([place]) EF  43 by echocardiogram at [facility] in June 2022.  Improved by TTE 06/17/21 to 50-55% She is NYHA II.  Volume status currently stable.  Metoprolol succinate to 100 mg daily  Continue isosorbide mononitrate 60 mg daily.    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 Atypical pneumonia post antibiotic RX Check non contrast chest CT    Medication Adjustments/Labs and Tests Ordered: Current medicines are reviewed at length with the patient today.  Concerns regarding medicines are outlined above.  Tests Ordered: Chest CT non contrast  Medication Changes: No orders of the defined types were placed in this encounter.  F/U in 6 months   Signed, Jenkins Rouge, MD  08/28/2021 9:02 AM    [facility] [place], [place],   [address] Phone: [phone]; Fax: [phone]

## 2021-08-27 LAB — URINALYSIS, ROUTINE W REFLEX MICROSCOPIC
Bilirubin, UA: NEGATIVE
Glucose, UA: NEGATIVE
Ketones, UA: NEGATIVE
Leukocytes,UA: NEGATIVE
Nitrite, UA: NEGATIVE
RBC, UA: NEGATIVE
Specific Gravity, UA: >1.03 — ABNORMAL HIGH (ref 1.005–1.030)
Urobilinogen, Ur: 0.2 mg/dL (ref 0.2–1.0)
pH, UA: 5 (ref 5.0–7.5)

## 2021-08-28 ENCOUNTER — Ambulatory Visit (INDEPENDENT_AMBULATORY_CARE_PROVIDER_SITE_OTHER): Payer: Medicaid Other | Admitting: Cardiovascular Disease

## 2021-08-28 ENCOUNTER — Encounter: Payer: Self-pay | Admitting: Cardiovascular Disease

## 2021-08-28 VITALS — BP 122/74 | HR 88 | Ht 60.0 in | Wt 178.8 lb

## 2021-08-28 DIAGNOSIS — R058 Other specified cough: Secondary | ICD-10-CM

## 2021-08-28 DIAGNOSIS — I255 Ischemic cardiomyopathy: Secondary | ICD-10-CM

## 2021-08-28 DIAGNOSIS — I77819 Aortic ectasia, unspecified site: Secondary | ICD-10-CM | POA: Diagnosis not present

## 2021-08-28 NOTE — Patient Instructions (Signed)
Medication Instructions:  Your physician recommends that you continue on your current medications as directed. Please refer to the Current Medication list given to you today.  *If you need a refill on your cardiac medications before your next appointment, please call your pharmacy*   Lab Work: NONE   If you have labs (blood work) drawn today and your tests are completely normal, you will receive your results only by: [place] (if you have MyChart) OR A paper copy in the mail If you have any lab test that is abnormal or we need to change your treatment, we will call you to review the results.   Testing/Procedures: Non-Cardiac CT scanning, (CAT scanning), is a noninvasive, special x-ray that produces cross-sectional images of the body using x-rays and a computer. CT scans help physicians diagnose and treat medical conditions. For some CT exams, a contrast material is used to enhance visibility in the area of the body being studied. CT scans provide greater clarity and reveal more details than regular x-ray exams.    Follow-Up: At [facility], you and your health needs are our priority.  As part of our continuing mission to provide you with exceptional heart care, we have created designated Provider Care Teams.  These Care Teams include your primary Cardiologist (physician) and Advanced Practice Providers (APPs -  Physician Assistants and Nurse Practitioners) who all work together to provide you with the care you need, when you need it.  We recommend signing up for the patient portal called "MyChart".  Sign up information is provided on this After Visit Summary.  MyChart is used to connect with patients for Virtual Visits (Telemedicine).  Patients are able to view lab/test results, encounter notes, upcoming appointments, etc.  Non-urgent messages can be sent to your provider as well.   To learn more about what you can do with MyChart, go to [email or web address].    Your next  appointment:   6 month(s)  The format for your next appointment:   In Person  Provider:   Jenkins Rouge, MD    Other Instructions Thank you for choosing [facility]!    Important Information About Sugar

## 2021-08-29 ENCOUNTER — Other Ambulatory Visit: Payer: Self-pay | Admitting: Urology

## 2021-08-31 ENCOUNTER — Other Ambulatory Visit: Payer: Self-pay | Admitting: Urology

## 2021-08-31 ENCOUNTER — Other Ambulatory Visit: Payer: Self-pay

## 2021-08-31 ENCOUNTER — Emergency Department (HOSPITAL_COMMUNITY)
Admission: EM | Admit: 2021-08-31 | Discharge: 2021-08-31 | Disposition: A | Discharge status: Home / Self Care | Payer: Medicaid Other | Attending: Emergency Medicine | Admitting: Emergency Medicine

## 2021-08-31 ENCOUNTER — Emergency Department (HOSPITAL_COMMUNITY): Payer: Medicaid Other

## 2021-08-31 DIAGNOSIS — Z794 Long term (current) use of insulin: Secondary | ICD-10-CM | POA: Insufficient documentation

## 2021-08-31 DIAGNOSIS — Z91040 Latex allergy status: Secondary | ICD-10-CM | POA: Diagnosis not present

## 2021-08-31 DIAGNOSIS — M62838 Other muscle spasm: Secondary | ICD-10-CM | POA: Diagnosis not present

## 2021-08-31 DIAGNOSIS — Z7982 Long term (current) use of aspirin: Secondary | ICD-10-CM | POA: Insufficient documentation

## 2021-08-31 DIAGNOSIS — M25511 Pain in right shoulder: Secondary | ICD-10-CM | POA: Diagnosis present

## 2021-08-31 DIAGNOSIS — R079 Chest pain, unspecified: Secondary | ICD-10-CM | POA: Insufficient documentation

## 2021-08-31 MED ORDER — CYCLOBENZAPRINE HCL 10 MG PO TABS: 10.0000 mg | ORAL_TABLET | Freq: Two times a day (BID) | ORAL | 0 refills | Status: DC | PRN

## 2021-08-31 MED ORDER — DIAZEPAM 5 MG PO TABS
5.0000 mg | ORAL_TABLET | Freq: Once | ORAL | Status: AC
  Administered 2021-08-31: 5 mg via ORAL
  Filled 2021-08-31: qty 1

## 2021-08-31 NOTE — ED Provider Notes (Signed)
[facility] EMERGENCY DEPARTMENT Provider Note   CSN: [id number] Arrival date & time: 08/31/21  1508     History Chief Complaint  Patient presents with   Shoulder Pain    Sandra [name] is a 46 y.o. female patient who presents to the emergency department for further evaluation of right shoulder pain that radiates down into the chest into the back.  This has been going on for 1-1/2 weeks.  She was seen and evaluated by her cardiologist for this who scheduled a CT with out contrast of the chest tomorrow.  Patient states she could not wait because the pain is intense.  It waxes and wanes and is worsened with certain movements and laying down.  She describes as a sharp sensation that grabs her.  She denies neck pain or weakness or numbness to her arms.  No shortness of breath.  Shoulder Pain      Home Medications Prior to Admission medications   Medication Sig Start Date End Date Taking? Authorizing Provider  cyclobenzaprine (FLEXERIL) 10 MG tablet Take 1 tablet (10 mg total) by mouth 2 (two) times daily as needed for muscle spasms. 08/31/21  Yes Gwyn Hieronymus M, PA-C  ACCU-CHEK GUIDE test strip USE AS INSTRUCTED TO MONITOR GLUCOSE 4 TIMES DAILY BEFORE MEALS AND BEFORE BED. 04/29/21   Brita Romp, NP  Accu-Chek Softclix Lancets lancets Use as instructed to monitor glucose 4 times daily 07/10/20   Brita Romp, NP  albuterol (PROAIR HFA) 108 (90 Base) MCG/ACT inhaler Inhale 2 puffs into the lungs every 6 (six) hours as needed for wheezing or shortness of breath. 07/24/21   Roxan Hockey, MD  aspirin EC 81 MG tablet Take 1 tablet (81 mg total) by mouth daily with breakfast. Swallow whole. 07/24/21   Roxan Hockey, MD  Blood Glucose Monitoring Suppl (ACCU-CHEK GUIDE) w/Device KIT 1 Piece by Does not apply route as directed. 04/15/20   Brita Romp, NP  Blood Pressure Monitoring (BLOOD PRESSURE CUFF) MISC See admin instructions.    [provider]  buPROPion  (WELLBUTRIN XL) 300 MG 24 hr tablet Take 300 mg by mouth every morning. 01/18/20   [provider]  cloNIDine HCl (KAPVAY) 0.1 MG TB12 ER tablet 1 tablet at bedtime.    [provider]  Continuous Blood Gluc Sensor (DEXCOM G6 SENSOR) MISC Apply new sensor every 10 days as directed 06/24/21   Brita Romp, NP  Continuous Blood Gluc Transmit (DEXCOM G6 TRANSMITTER) MISC 1 Device by Does not apply route every 3 (three) months. 06/24/21   Brita Romp, NP  Evolocumab (REPATHA SURECLICK) 947 MG/ML SOAJ Inject 1 Dose into the skin every 14 (fourteen) days. 06/24/21   Hilty, Nadean Corwin, MD  fenofibrate (TRICOR) 145 MG tablet Take 1 tablet (145 mg total) by mouth daily. 06/24/21   Hilty, Nadean Corwin, MD  FLUoxetine (PROZAC) 40 MG capsule Take 40 mg by mouth daily. 04/02/20   [provider]  gabapentin (NEURONTIN) 300 MG capsule TAKE 1 CAPSULE(300 MG) BY MOUTH THREE TIMES DAILY Patient taking differently: Take 300 mg by mouth 3 (three) times daily. 08/08/19   Edrick Kins, DPM  GLOBAL EASE INJECT PEN NEEDLES 31G X 5 MM MISC Inject into the skin. 03/13/21   [provider]  HYDROcodone-acetaminophen (NORCO/VICODIN) 5-325 MG tablet Take 1 tablet by mouth every 6 (six) hours as needed. Patient not taking: Reported on 08/28/2021 07/24/21   Roxan Hockey, MD  hydrocortisone (ANUSOL-HC) 2.5 % rectal cream  Place 1 application rectally 2 (two) times daily. 02/06/21   Erenest Rasher, PA-C  HYDROmorphone (DILAUDID) 4 MG tablet TAKE 1 TABLET EVERY 4 HOURS AS NEEDED FOR SEVERE PAIN. 08/23/21   Cleon Gustin, MD  HYDROmorphone (DILAUDID) 4 MG tablet Take 1 tablet (4 mg total) by mouth every 4 (four) hours as needed for severe pain. 08/25/21   McKenzie, Candee Furbish, MD  hydrOXYzine (ATARAX) 25 MG tablet Take 1 tablet (25 mg total) by mouth 3 (three) times daily as needed for itching or anxiety. 07/24/21   Roxan Hockey, MD  insulin aspart (NOVOLOG) 100 UNIT/ML injection Use with  Omnipod for TDD around 80 units daily 08/05/21   Brita Romp, NP  Insulin Disposable Pump (OMNIPOD DASH PODS, GEN 4,) MISC Change pod every 48-72 hrs as directed 08/05/21   Brita Romp, NP  Insulin Pen Needle (PEN NEEDLES) 31G X 6 MM MISC Use to inject insulin 4 times daily 02/24/21   Brita Romp, NP  INSULIN SYRINGE 1CC/29G 29G X 1/2" 1 ML MISC Use to inject insulin into Omnipod 08/05/21   Brita Romp, NP  linaclotide (LINZESS) 72 MCG capsule Take 1 capsule (72 mcg total) by mouth daily before breakfast. 06/18/21   Erenest Rasher, PA-C  nitroGLYCERIN (NITROSTAT) 0.4 MG SL tablet Place 1 tablet (0.4 mg total) under the tongue every 5 (five) minutes as needed for chest pain. 12/20/20 08/28/21  Richardson Dopp T, PA-C  pantoprazole (PROTONIX) 40 MG tablet Take 1 tablet (40 mg total) by mouth 2 (two) times daily before a meal. 06/18/21   Erenest Rasher, PA-C  rosuvastatin (CRESTOR) 40 MG tablet Take 1 tablet (40 mg total) by mouth daily. 06/24/21   Hilty, Nadean Corwin, MD  tamsulosin (FLOMAX) 0.4 MG CAPS capsule Take 1 capsule (0.4 mg total) by mouth at bedtime. 07/24/21   Emokpae, Courage, MD  VYVANSE 40 MG capsule Take 40 mg by mouth every morning. 12/20/19   [provider]      Allergies    Ibuprofen, Benazepril, Diphenhydramine, Latex, Metronidazole, Oxycodone, Toradol [ketorolac tromethamine], Adhesive [tape], Codeine, Loratadine, Nystatin-triamcinolone, and Penicillins    Review of Systems   Review of Systems  All other systems reviewed and are negative.   Physical Exam Updated Vital Signs BP (!) 149/80 (BP Location: Right Arm)   Pulse 68   Temp 98.1 F (36.7 C) (Oral)   Resp 18   Ht 5' (1.524 m)   Wt 77.1 kg   SpO2 100%   BMI 33.20 kg/m  Physical Exam Vitals and nursing note reviewed.  Constitutional:      Appearance: Normal appearance.  HENT:     Head: Normocephalic and atraumatic.  Eyes:     General:        Right eye: No discharge.        Left  eye: No discharge.     Conjunctiva/sclera: Conjunctivae normal.  Neck:     Comments: No midline tenderness. Pulmonary:     Effort: Pulmonary effort is normal.  Chest:     Comments: There is some tenderness to palpation over the right anterior chest wall. Musculoskeletal:     Comments: There is point tenderness over the right trapezius muscle.  Patient has full range of motion in the right shoulder.  5/5 strength to the upper extremities.  Negative Spurling maneuver.  Skin:    General: Skin is warm and dry.     Findings: No rash.  Neurological:  General: No focal deficit present.     Mental Status: She is alert.  Psychiatric:        Mood and Affect: Mood normal.        Behavior: Behavior normal.     ED Results / Procedures / Treatments   Labs (all labs ordered are listed, but only abnormal results are displayed) Labs Reviewed - No data to display  EKG None  Radiology CT Chest Wo Contrast  Result Date: 08/31/2021 CLINICAL DATA:  Shoulder pain.  Low back for 1 week. EXAM: CT CHEST WITHOUT CONTRAST TECHNIQUE: Multidetector CT imaging of the chest was performed following the standard protocol without IV contrast. RADIATION DOSE REDUCTION: This exam was performed according to the departmental dose-optimization program which includes automated exposure control, adjustment of the mA and/or kV according to patient size and/or use of iterative reconstruction technique. COMPARISON:  None Available. FINDINGS: Cardiovascular: Normal heart size. No pericardial effusion. Aortic atherosclerotic calcifications. Calcifications noted in the proximal LAD and left circumflex coronary arteries. Mediastinum/Nodes: No enlarged mediastinal or axillary lymph nodes. Thyroid gland, trachea, and esophagus demonstrate no significant findings. Lungs/Pleura: No pleural effusion, airspace consolidation, atelectasis, or pneumothorax. Non solid nodule within the posterior left upper lobe measures 1.4 cm, image  58/4. Also in the left upper lobe is a non solid nodule measuring 1.1 cm, image 54/4. Multiple small ground-glass nodules are noted within the posterior right upper lobe, image 51/4. Upper Abdomen: No acute abnormality. Musculoskeletal: No chest wall mass or suspicious bone lesions identified. IMPRESSION: 1. No acute cardiopulmonary abnormalities. 2. Multiple small non solid nodules are noted within the left upper lobe. These are nonspecific and may be postinflammatory or infectious in etiology. The largest nodule is in the posterior left upper lobe measuring 1.4 cm. Non-contrast chest CT at 3-6 months is recommended. If nodules persist, subsequent management will be based upon the most suspicious nodule(s). This recommendation follows the consensus statement: Guidelines for Management of Incidental Pulmonary Nodules Detected on CT Images: From the Fleischner Society 2017; Radiology 2017; 284:228-243. 3. Coronary artery calcifications. 4. Aortic Atherosclerosis (ICD10-I70.0). Electronically Signed   By: Kerby Moors M.D.   On: 08/31/2021 16:37    Procedures Procedures    Medications Ordered in ED Medications  diazepam (VALIUM) tablet 5 mg (5 mg Oral Given 08/31/21 1615)    ED Course/ Medical Decision Making/ A&P Clinical Course as of 08/31/21 1707  Sun Aug 31, 2021  1705 On reevaluation, patient is feeling better after Valium.  This is likely muscular spasm.  I will prescribe her some muscle relaxers to go home with. [CF]  5277 CT Chest Wo Contrast I personally ordered and interpreted the CT chest without contrast.  Did not see any evidence of pneumonia or any pulmonary causes of her pain.  She does have some pulmonary nodules which I notified her about.  I do agree with the radiologist interpretation. [CF]    Clinical Course User Index [CF] Hendricks Limes, PA-C                           Medical Decision Making KESHAWN SUNDBERG is a 46 y.o. female patient who presents to the emergency  department for further evaluation of right shoulder pain.  I suspect this is likely muscular in nature.  Probably a spasm.  I will go ahead and get a CT chest without contrast today since she is here so she is not to come back tomorrow.  I will start her with Valium and plan to reassess.  Patient is on narcotic pain medication for kidney stones currently.  As highlighted in ED course, patient feeling better after Valium.  I suspect this is likely a muscular spasm.  We will send her muscle relaxers to go home with.  Patient amenable to this plan.  Strict return precautions were discussed.  She is safe for discharge at this time.  Amount and/or Complexity of Data Reviewed Radiology: ordered. Decision-making details documented in ED Course.  Risk Prescription drug management.    Final Clinical Impression(s) / ED Diagnoses Final diagnoses:  Trapezius muscle spasm    Rx / DC Orders ED Discharge Orders          Ordered    cyclobenzaprine (FLEXERIL) 10 MG tablet  2 times daily PRN        08/31/21 1706              Myna Bright [place], [place] 08/31/21 1708    Milton Ferguson, MD 09/01/21 1646

## 2021-08-31 NOTE — Discharge Instructions (Signed)
Please follow-up with your primary care doctor for further evaluation.  You may take Flexeril as prescribed for muscle spasm.  Please do not mix with alcohol and be careful while taking this medication with your narcotic pain medication.  Please return to the emergency department for any worsening symptoms.  I would keep using heat as well over the shoulder.

## 2021-08-31 NOTE — ED Triage Notes (Signed)
Pt with right shoulder pain and lower back pain x a week.  Denies any injury.

## 2021-09-01 ENCOUNTER — Ambulatory Visit (HOSPITAL_COMMUNITY): Payer: Medicaid Other

## 2021-09-01 ENCOUNTER — Other Ambulatory Visit: Payer: Self-pay | Admitting: Urology

## 2021-09-01 ENCOUNTER — Other Ambulatory Visit: Payer: Self-pay | Admitting: Nurse Practitioner

## 2021-09-01 MED ORDER — INSULIN ASPART 100 UNIT/ML IJ SOLN: INTRAMUSCULAR | 6 refills | Status: DC

## 2021-09-01 MED ORDER — OMNIPOD DASH PODS (GEN 4) MISC
6 refills | Status: DC
Start: 2021-09-01 — End: 2021-10-14

## 2021-09-03 ENCOUNTER — Other Ambulatory Visit: Payer: Self-pay | Admitting: Physical Medicine and Rehabilitation

## 2021-09-03 DIAGNOSIS — M5451 Vertebrogenic low back pain: Secondary | ICD-10-CM

## 2021-09-04 ENCOUNTER — Ambulatory Visit (HOSPITAL_COMMUNITY)
Admission: RE | Admit: 2021-09-04 | Discharge: 2021-09-04 | Disposition: A | Discharge status: Home / Self Care | Payer: Medicaid Other | Source: Ambulatory Visit | Attending: Urology | Admitting: Urology

## 2021-09-04 ENCOUNTER — Other Ambulatory Visit: Payer: Self-pay | Admitting: Urology

## 2021-09-04 ENCOUNTER — Ambulatory Visit (HOSPITAL_COMMUNITY): Admission: RE | Admit: 2021-09-04 | Payer: Medicaid Other | Source: Ambulatory Visit

## 2021-09-04 DIAGNOSIS — N1330 Unspecified hydronephrosis: Secondary | ICD-10-CM | POA: Diagnosis present

## 2021-09-04 NOTE — Telephone Encounter (Signed)
Please review and refill if appropriate 

## 2021-09-04 NOTE — Telephone Encounter (Signed)
Duplicate request

## 2021-09-04 NOTE — Telephone Encounter (Signed)
Duplicate request, please refill if appropriate.

## 2021-09-05 ENCOUNTER — Other Ambulatory Visit: Payer: Self-pay | Admitting: Urology

## 2021-09-10 ENCOUNTER — Other Ambulatory Visit: Payer: Self-pay | Admitting: Urology

## 2021-09-12 ENCOUNTER — Ambulatory Visit (INDEPENDENT_AMBULATORY_CARE_PROVIDER_SITE_OTHER): Payer: Medicaid Other | Admitting: Urology

## 2021-09-12 ENCOUNTER — Encounter: Payer: Self-pay | Admitting: Cardiovascular Disease

## 2021-09-12 ENCOUNTER — Encounter: Payer: Self-pay | Admitting: Urology

## 2021-09-12 ENCOUNTER — Other Ambulatory Visit: Payer: Self-pay | Admitting: Urology

## 2021-09-12 VITALS — BP 162/80 | HR 97

## 2021-09-12 DIAGNOSIS — N1330 Unspecified hydronephrosis: Secondary | ICD-10-CM | POA: Diagnosis not present

## 2021-09-12 LAB — URINALYSIS, ROUTINE W REFLEX MICROSCOPIC
Bilirubin, UA: NEGATIVE
Glucose, UA: NEGATIVE
Ketones, UA: NEGATIVE
Leukocytes,UA: NEGATIVE
Nitrite, UA: NEGATIVE
RBC, UA: NEGATIVE
Specific Gravity, UA: >1.03 — ABNORMAL HIGH (ref 1.005–1.030)
Urobilinogen, Ur: 0.2 mg/dL (ref 0.2–1.0)
pH, UA: 5 (ref 5.0–7.5)

## 2021-09-12 MED ORDER — CYCLOBENZAPRINE HCL 10 MG PO TABS
10.0000 mg | ORAL_TABLET | Freq: Three times a day (TID) | ORAL | 1 refills | Status: DC | PRN
Start: 2021-09-12 — End: 2022-03-06

## 2021-09-12 NOTE — Progress Notes (Unsigned)
09/12/2021 8:52 AM   Sandra [name] 07-31-1975 [id number]  Referring provider: [place] [name], Sandra [name] [address] [place],  [place] [address]  Followup right hydronephrosis   HPI: Sandra [name] is a 46yo here for followup for right hydronephrosis. She underwent CT 09/04/2021 which shows very mild right hydronephrosis. She continues to complain of intermittent right flank pain and is requesting narcotics. She has worsening pain with movement and activity. The pain is sharp, intermittent, moderate to severe.    PMH: Past Medical History:  Diagnosis Date   ADHD (attention deficit hyperactivity disorder)    Allergic rhinitis    Anemia    hx of   Barrett's esophagus    Bipolar 1 disorder (HCC)    Bladder tumor    CAD (coronary artery disease)    Cancer ([place])    bladder   Constipation    Dilated cardiomyopathy ([place]) 12/20/2020   Echo 6/22 ([facility]): EF 43, mild MR, mild TR // Echo 3/22: EF 50-55   Dizziness    Full dentures    GERD (gastroesophageal reflux disease)    Gross hematuria    Hyperlipidemia    Hypertension    Stable angina (HCC)    Type 2 diabetes mellitus ([place])    Urgency of urination    dysuria, sui   Wears glasses     Surgical History: Past Surgical History:  Procedure Laterality Date   BALLOON DILATION N/A 02/17/2021   Procedure: BALLOON DILATION;  Surgeon: Sandra Harman, DO;  [facility];  Service: Endoscopy;  Laterality: N/A;   BIOPSY  02/17/2021   Procedure: BIOPSY;  Surgeon: Sandra Harman, DO;  [facility];  Service: Endoscopy;;   COLONOSCOPY WITH PROPOFOL N/A 02/17/2021   Surgeon: Sandra Harman, DO; nonbleeding internal hemorrhoids, otherwise normal exam.  Repeat in 10 years.   CYSTOSCOPY N/A 10/03/2015   Procedure: CYSTOSCOPY;  Surgeon: Sandra Seal, MD;  Location: [facility];  Service: Urology;  Laterality: N/A;   CYSTOSCOPY W/ URETERAL STENT PLACEMENT Right 07/24/2021   Procedure: CYSTOSCOPY WITH  RETROGRADE PYELOGRAM/URETERAL STENT PLACEMENT;  Surgeon: Sandra Gustin, MD;  Location: [facility];  Service: Urology;  Laterality: Right;   CYSTOSCOPY WITH RETROGRADE PYELOGRAM, URETEROSCOPY AND STENT PLACEMENT Right 08/14/2021   Procedure: CYSTOSCOPY WITH RETROGRADE PYELOGRAM, URETEROSCOPY AND STENT EXCHANGE;  Surgeon: Sandra Gustin, MD;  Location[facility];  Service: Urology;  Laterality: Right;   CYSTOSCOPY WITH STENT PLACEMENT Right 10/31/2015   Procedure: CYSTOSCOPY WITH ATTEMPTED RIGHT URETERAL OPENING;  Surgeon: Sandra Seal, MD;  Location: [facility];  Service: Urology;  Laterality: Right;   ESOPHAGOGASTRODUODENOSCOPY (EGD) WITH PROPOFOL N/A 02/17/2021   Surgeon: Sandra Harman, DO; 2 cm hiatal hernia, Barrett's esophagus, gastritis with biopsies negative for H. pylori.  S/p empiric esophageal dilation.  Repeat in 3 years.   FOOT SURGERY Left    September 2022, broke 7 bones in left foot   INTERSTIM IMPLANT PLACEMENT N/A 03/14/2018   Procedure: Sandra [name] IMPLANT FIRST STAGE;  Surgeon: Sandra Gustin, MD;  [facility];  Service: Urology;  Laterality: N/A;   INTERSTIM IMPLANT PLACEMENT N/A 03/30/2018   Procedure: Sandra [name] IMPLANT SECOND STAGE;  Surgeon: Sandra Gustin, MD;  Location[facility];  Service: Urology;  Laterality: N/A;   LEFT HEART CATH AND CORONARY ANGIOGRAPHY N/A 05/31/2020   Procedure: LEFT HEART CATH AND CORONARY ANGIOGRAPHY;  Surgeon: Sandra Sine, MD;  Location: [facility];  Service:  Cardiovascular;  Laterality: N/A;   MULTIPLE TOOTH EXTRACTIONS  2015   OVARIAN CYST REMOVAL Right 2004 approx   SHOULDER OPEN ROTATOR CUFF REPAIR Left 04/20/2019   Procedure: ROTATOR CUFF REPAIR SHOULDER OPEN;  Surgeon: Sandra Civil, MD;  Location: [facility];  Service: Orthopedics;  Laterality: Left;   TONSILLECTOMY  12/30/2004   TOTAL ABDOMINAL HYSTERECTOMY W/ BILATERAL SALPINGOOPHORECTOMY  05/16/2004   TRANSURETHRAL RESECTION OF BLADDER TUMOR  N/A 10/03/2015   Procedure: TRANSURETHRAL RESECTION OF BLADDER TUMOR (TURBT);  Surgeon: Sandra Seal, MD;  Location: [facility];  Service: Urology;  Laterality: N/A;   TRANSURETHRAL RESECTION OF BLADDER TUMOR N/A 10/31/2015   Procedure: RE-STAGINGG TRANSURETHRAL RESECTION OF BLADDER TUMOR (TURBT);  Surgeon: Sandra Seal, MD;  Location: [facility];  Service: Urology;  Laterality: N/A;   TRANSURETHRAL RESECTION OF BLADDER TUMOR N/A 08/17/2016   Procedure: TRANSURETHRAL RESECTION OF BLADDER TUMOR (TURBT);  Surgeon: Sandra Gustin, MD;  [facility];  Service: Urology;  Laterality: N/A;   URETERAL BIOPSY Right 08/14/2021   Procedure: URETERAL BIOPSY;  Surgeon: Sandra Gustin, MD;  Location[facility];  Service: Urology;  Laterality: Right;    Home Medications:  Allergies as of 09/12/2021       Reactions   Ibuprofen Hives   itching itching itching   Benazepril Hives   Diphenhydramine Hives   Other reaction(s): hives Other reaction(s): hives   Latex Dermatitis   States she had redness to skin and itching    Metronidazole Nausea And Vomiting   Other reaction(s): nausea Other reaction(s): nausea   Oxycodone    Pt has tolerated hydromorphone in the past and takes Norco at home.   Toradol [ketorolac Tromethamine] Itching   Adhesive [tape] Rash   Codeine Nausea And Vomiting, Rash   Other reaction(s): hives   Loratadine Rash   Nystatin-triamcinolone Rash   Penicillins Rash   Has patient had a PCN reaction causing immediate rash, facial/tongue/throat swelling, SOB or lightheadedness with hypotension: No Has patient had a PCN reaction causing severe rash involving mucus membranes or skin necrosis: Yes Has patient had a PCN reaction that required hospitalization: No Has patient had a PCN reaction occurring within the last 10 years: no If all of the above answers are "NO", then may proceed with Cephalosporin use. Other reaction(s): nausea Received ancef  w/o issue (7/23)        Medication List        Accurate as of September 12, 2021  8:52 AM. If you have any questions, ask your nurse or doctor.          Accu-Chek Guide test strip Generic drug: glucose blood USE AS INSTRUCTED TO MONITOR GLUCOSE 4 TIMES DAILY BEFORE MEALS AND BEFORE BED.   Accu-Chek Guide w/Device Kit 1 Piece by Does not apply route as directed.   Accu-Chek Softclix Lancets lancets Use as instructed to monitor glucose 4 times daily   albuterol 108 (90 Base) MCG/ACT inhaler Commonly known as: ProAir HFA Inhale 2 puffs into the lungs every 6 (six) hours as needed for wheezing or shortness of breath.   aspirin EC 81 MG tablet Take 1 tablet (81 mg total) by mouth daily with breakfast. Swallow whole.   Blood Pressure Cuff Misc See admin instructions.   buPROPion 300 MG 24 hr tablet Commonly known as: WELLBUTRIN XL Take 300 mg by mouth every morning.   cloNIDine HCl 0.1 MG Tb12 ER tablet Commonly known as: KAPVAY 1 tablet at bedtime.  cyclobenzaprine 10 MG tablet Commonly known as: FLEXERIL Take 1 tablet (10 mg total) by mouth 2 (two) times daily as needed for muscle spasms.   Dexcom G6 Sensor Misc Apply new sensor every 10 days as directed   Dexcom G6 Transmitter Misc 1 Device by Does not apply route every 3 (three) months.   fenofibrate 145 MG tablet Commonly known as: TRICOR Take 1 tablet (145 mg total) by mouth daily.   FLUoxetine 40 MG capsule Commonly known as: PROZAC Take 40 mg by mouth daily.   gabapentin 300 MG capsule Commonly known as: NEURONTIN TAKE 1 CAPSULE(300 MG) BY MOUTH THREE TIMES DAILY What changed: See the new instructions.   HYDROcodone-acetaminophen 5-325 MG tablet Commonly known as: NORCO/VICODIN Take 1 tablet by mouth every 6 (six) hours as needed.   hydrocortisone 2.5 % rectal cream Commonly known as: ANUSOL-HC Place 1 application rectally 2 (two) times daily.   HYDROmorphone 4 MG tablet Commonly known as:  DILAUDID TAKE 1 TABLET EVERY 4 HOURS AS NEEDED FOR SEVERE PAIN.   HYDROmorphone 4 MG tablet Commonly known as: DILAUDID Take 1 tablet (4 mg total) by mouth every 4 (four) hours as needed for severe pain.   hydrOXYzine 25 MG tablet Commonly known as: ATARAX Take 1 tablet (25 mg total) by mouth 3 (three) times daily as needed for itching or anxiety.   insulin aspart 100 UNIT/ML injection Commonly known as: NovoLOG Use with Omnipod for TDD around 150 units daily   INSULIN SYRINGE 1CC/29G 29G X 1/2" 1 ML Misc Use to inject insulin into Omnipod   linaclotide 72 MCG capsule Commonly known as: Linzess Take 1 capsule (72 mcg total) by mouth daily before breakfast.   nitroGLYCERIN 0.4 MG SL tablet Commonly known as: NITROSTAT Place 1 tablet (0.4 mg total) under the tongue every 5 (five) minutes as needed for chest pain.   Omnipod DASH Pods (Gen 4) Misc Change pod every 48-72 hrs as directed   pantoprazole 40 MG tablet Commonly known as: PROTONIX Take 1 tablet (40 mg total) by mouth 2 (two) times daily before a meal.   Pen Needles 31G X 6 MM Misc Use to inject insulin 4 times daily   Global Ease Inject Pen Needles 31G X 5 MM Misc Generic drug: Insulin Pen Needle Inject into the skin.   Repatha SureClick 272 MG/ML Soaj Generic drug: Evolocumab Inject 1 Dose into the skin every 14 (fourteen) days.   rosuvastatin 40 MG tablet Commonly known as: CRESTOR Take 1 tablet (40 mg total) by mouth daily.   tamsulosin 0.4 MG Caps capsule Commonly known as: FLOMAX Take 1 capsule (0.4 mg total) by mouth at bedtime.   Vyvanse 40 MG capsule Generic drug: lisdexamfetamine Take 40 mg by mouth every morning.        Allergies:  Allergies  Allergen Reactions   Ibuprofen Hives    itching itching itching   Benazepril Hives   Diphenhydramine Hives    Other reaction(s): hives Other reaction(s): hives   Latex Dermatitis    States she had redness to skin and itching     Metronidazole Nausea And Vomiting    Other reaction(s): nausea Other reaction(s): nausea   Oxycodone     Pt has tolerated hydromorphone in the past and takes Norco at home.   Toradol [Ketorolac Tromethamine] Itching   Adhesive [Tape] Rash   Codeine Nausea And Vomiting and Rash    Other reaction(s): hives   Loratadine Rash   Nystatin-Triamcinolone Rash   Penicillins  Rash    Has patient had a PCN reaction causing immediate rash, facial/tongue/throat swelling, SOB or lightheadedness with hypotension: No Has patient had a PCN reaction causing severe rash involving mucus membranes or skin necrosis: Yes Has patient had a PCN reaction that required hospitalization: No Has patient had a PCN reaction occurring within the last 10 years: no  If all of the above answers are "NO", then may proceed with Cephalosporin use. Other reaction(s): nausea Received ancef w/o issue (7/23)    Family History: Family History  Problem Relation Age of Onset   Hypertension Mother    Cancer Mother    Breast cancer Maternal Aunt    Lung cancer Maternal Uncle    Cancer Other    Diabetes Daughter    Colon cancer Neg Hx     Social History:  reports that she quit smoking about 7 months ago. Her smoking use included cigarettes. She has a 5.00 pack-year smoking history. She has never used smokeless tobacco. She reports that she does not drink alcohol and does not use drugs.  ROS: All other review of systems were reviewed and are negative except what is noted above in HPI  Physical Exam: BP (!) 162/80   Pulse 97   Constitutional:  Alert and oriented, No acute distress. HEENT: [place] AT, moist mucus membranes.  Trachea midline, no masses. Cardiovascular: No clubbing, cyanosis, or edema. Respiratory: Normal respiratory effort, no increased work of breathing. GI: Abdomen is soft, nontender, nondistended, no abdominal masses GU: No CVA tenderness.  Lymph: No cervical or inguinal lymphadenopathy. Skin: No rashes,  bruises or suspicious lesions. Neurologic: Grossly intact, no focal deficits, moving all 4 extremities. Psychiatric: Normal mood and affect.  Laboratory Data: Lab Results  Component Value Date   WBC 12.4 (H) 07/26/2021   HGB 11.6 (L) 07/26/2021   HCT 37.9 07/26/2021   MCV 82.8 07/26/2021   PLT 384 07/26/2021    Lab Results  Component Value Date   CREATININE 1.03 (H) 07/26/2021    No results found for: "PSA"  No results found for: "TESTOSTERONE"  Lab Results  Component Value Date   HGBA1C 8.1 06/24/2021    Urinalysis    Component Value Date/Time   COLORURINE YELLOW 07/26/2021 1739   APPEARANCEUR Clear 08/25/2021 1553   LABSPEC 1.022 07/26/2021 1739   PHURINE 7.0 07/26/2021 1739   GLUCOSEU Negative 08/25/2021 1553   HGBUR LARGE (A) 07/26/2021 1739   BILIRUBINUR Negative 08/25/2021 1553   KETONESUR NEGATIVE 07/26/2021 1739   PROTEINUR Trace (A) 08/25/2021 1553   PROTEINUR 100 (A) 07/26/2021 1739   UROBILINOGEN 0.2 03/01/2011 1920   NITRITE Negative 08/25/2021 1553   NITRITE NEGATIVE 07/26/2021 1739   LEUKOCYTESUR Negative 08/25/2021 1553   LEUKOCYTESUR TRACE (A) 07/26/2021 1739    Lab Results  Component Value Date   LABMICR See below: 06/18/2021   WBCUA 0-5 06/18/2021   LABEPIT 0-10 06/18/2021   MUCUS Present 06/18/2021   BACTERIA NONE SEEN 07/26/2021    Pertinent Imaging: Ct 09/04/2021: Images reviewed and discussed with the patient  No results found for this or any previous visit.  No results found for this or any previous visit.  No results found for this or any previous visit.  No results found for this or any previous visit.  Results for orders placed during the hospital encounter of 09/04/21  US RENAL  Narrative CLINICAL DATA:  Follow-up known right renal calcification.  EXAM: RENAL / URINARY TRACT ULTRASOUND COMPLETE  COMPARISON:  July 21, 2021  renal ultrasound  FINDINGS: Right Kidney:  Renal measurements: 10.4 x 4.3 x 5.7 cm =  volume: 133 mL. Echogenicity within normal limits. No mass or hydronephrosis visualized.  Left Kidney:  Renal measurements: 11.9 x 5.7 x 4.8 cm = volume: 172 mL. Echogenicity within normal limits. No mass or hydronephrosis visualized.  Bladder:  Appears normal for degree of bladder distention.  Other:  None.  IMPRESSION: No renal stones or obstruction identified. The bladder is unremarkable.   Electronically Signed By: Dorise Bullion III M.D. On: 09/04/2021 17:00  No results found for this or any previous visit.  No results found for this or any previous visit.  Results for orders placed in visit on 08/25/21  CT RENAL STONE STUDY  Narrative CLINICAL DATA:  Right-sided flank pain hematuria for 6 weeks.  EXAM: CT ABDOMEN AND PELVIS WITHOUT CONTRAST  TECHNIQUE: Multidetector CT imaging of the abdomen and pelvis was performed following the standard protocol without IV contrast.  RADIATION DOSE REDUCTION: This exam was performed according to the departmental dose-optimization program which includes automated exposure control, adjustment of the mA and/or kV according to patient size and/or use of iterative reconstruction technique.  COMPARISON:  Prior CT scans 07/26/2021 and 07/18/2021  FINDINGS: Lower chest: The lung bases are clear of acute process. No pleural effusion or pulmonary lesions. The heart is normal in size. No pericardial effusion. The distal esophagus and aorta are unremarkable.  Hepatobiliary: No focal hepatic lesions or intrahepatic biliary dilatation. The gallbladder is normal. No common bile duct dilatation.  Pancreas: No mass, inflammation or ductal dilatation.  Spleen: Normal size. No focal lesions.  Adrenals/Urinary Tract: Adrenal glands are normal.  The right-sided double-J ureteral stent is been removed. There is very mild right-sided hydroureter but no obstructing ureteral calculi.  Stomach/Bowel: The stomach, duodenum, small  bowel and colon are grossly normal without oral contrast. No inflammatory changes, mass lesions or obstructive findings.  Vascular/Lymphatic: Age advanced atherosclerotic calcifications involving the aorta and iliac arteries. No mesenteric or retroperitoneal mass or adenopathy.  Reproductive: The uterus and ovaries are is surgically absent.  Other: Stable small periumbilical abdominal hernia containing fat. AP left-sided sacral nerve root stimulator is noted.  Musculoskeletal: No significant bony findings.  IMPRESSION: 1. Interval removal of the right-sided double-J ureteral stent with very mild right-sided hydroureter but no obstructing ureteral calculi. 2. No acute abdominal/pelvic findings, mass lesions or adenopathy. 3. Age advanced atherosclerotic calcifications involving the aorta and iliac arteries.  Aortic Atherosclerosis (ICD10-I70.0).   Electronically Signed By: Marijo Sanes M.D. On: 09/04/2021 11:39   Assessment & Plan:    1. Hydronephrosis, unspecified hydronephrosis type -CT reassuring - Urinalysis, Routine w reflex microscopic 2. Back pain -flexeril 78m TID PRN  RTC 6 weeks for cystoscopy   No follow-ups on file.  PNicolette Bang MD  CSt [facility] CenterUrology [place]

## 2021-09-12 NOTE — Patient Instructions (Signed)
Chronic Back Pain When back pain lasts longer than 3 months, it is called chronic back pain. Pain may get worse at certain times (flare-ups). There are things you can do at home to manage your pain. Follow these instructions at home: Pay attention to any changes in your symptoms. Take these actions to help with your pain: Managing pain and stiffness     If told, put ice on the painful area. Your doctor may tell you to use ice for 24-48 hours after the flare-up starts. To do this: Put ice in a plastic bag. Place a towel between your skin and the bag. Leave the ice on for 20 minutes, 2-3 times a day. If told, put heat on the painful area. Do this as often as told by your doctor. Use the heat source that your doctor recommends, such as a moist heat pack or a heating pad. Place a towel between your skin and the heat source. Leave the heat on for 20-30 minutes. Take off the heat if your skin turns bright red. This is especially important if you are unable to feel pain, heat, or cold. You may have a greater risk of getting burned. Soak in a warm bath. This can help relieve pain. Activity  Avoid bending and other activities that make pain worse. When standing: Keep your upper back and neck straight. Keep your shoulders pulled back. Avoid slouching. When sitting: Keep your back straight. Relax your shoulders. Do not round your shoulders or pull them backward. Do not sit or stand in one place for long periods of time. Take short rest breaks during the day. Lying down or standing is usually better than sitting. Resting can help relieve pain. When sitting or lying down for a long time, do some mild activity or stretching. This will help to prevent stiffness and pain. Get regular exercise. Ask your doctor what activities are safe for you. Do not lift anything that is heavier than 10 lb (4.5 kg) or the limit that you are told, until your doctor says that it is safe. To prevent injury when you lift  things: Bend your knees. Keep the weight close to your body. Avoid twisting. Sleep on a firm mattress. Try lying on your side with your knees slightly bent. If you lie on your back, put a pillow under your knees. Medicines Treatment may include medicines for pain and swelling taken by mouth or put on the skin, prescription pain medicine, or muscle relaxants. Take over-the-counter and prescription medicines only as told by your doctor. Ask your doctor if the medicine prescribed to you: Requires you to avoid driving or using machinery. Can cause trouble pooping (constipation). You may need to take these actions to prevent or treat trouble pooping: Drink enough fluid to keep your pee (urine) pale yellow. Take over-the-counter or prescription medicines. Eat foods that are high in fiber. These include beans, whole grains, and fresh fruits and vegetables. Limit foods that are high in fat and sugars. These include fried or sweet foods. General instructions Do not use any products that contain nicotine or tobacco, such as cigarettes, e-cigarettes, and chewing tobacco. If you need help quitting, ask your doctor. Keep all follow-up visits as told by your doctor. This is important. Contact a doctor if: Your pain does not get better with rest or medicine. Your pain gets worse, or you have new pain. You have a high fever. You lose weight very quickly. You have trouble doing your normal activities. Get help right away   if: One or both of your legs or feet feel weak. One or both of your legs or feet lose feeling (have numbness). You have trouble controlling when you poop (have a bowel movement) or pee (urinate). You have bad back pain and: You feel like you may vomit (nauseous), or you vomit. You have pain in your belly (abdomen). You have shortness of breath. You faint. Summary When back pain lasts longer than 3 months, it is called chronic back pain. Pain may get worse at certain times  (flare-ups). Use ice and heat as told by your doctor. Your doctor may tell you to use ice after flare-ups. This information is not intended to replace advice given to you by your health care provider. Make sure you discuss any questions you have with your health care provider. Document Revised: 02/08/2019 Document Reviewed: 02/08/2019 Elsevier Patient Education  2023 [employer] Inc[employer]  

## 2021-09-16 ENCOUNTER — Other Ambulatory Visit: Payer: Self-pay | Admitting: Urology

## 2021-09-16 ENCOUNTER — Telehealth: Payer: Self-pay

## 2021-09-16 NOTE — Telephone Encounter (Signed)
Patient called advising that she needed a refill on pain medication. She could not remember the name but stated it was the most recent one that was prescribed.   Pharmacy: [facility]

## 2021-09-17 ENCOUNTER — Other Ambulatory Visit: Payer: Self-pay

## 2021-09-17 ENCOUNTER — Other Ambulatory Visit: Payer: Self-pay | Admitting: Urology

## 2021-09-17 NOTE — Telephone Encounter (Signed)
Patient states the flexeril that was rx'd has been giving her a headache.  She states you had said you would send in her pain meds.  Will you resend? She is scheduled to f/u on 09/27 for a cysto.

## 2021-09-26 NOTE — Telephone Encounter (Signed)
Patient called back saying she is still having pain on back right side.  Feeling as she did with previous kidney stone pain.  Not able to do much due to pain.  Please advise.  Thanks, Sandra [name]

## 2021-09-26 NOTE — Telephone Encounter (Signed)
Returned call to patient. Per Dr. Alyson Ingles pt made aware that ct showed no kidney stones. Patient advised to f/u with PCP. Patient voiced understanding.

## 2021-09-30 ENCOUNTER — Ambulatory Visit: Payer: Medicaid Other | Admitting: Nurse Practitioner

## 2021-09-30 DIAGNOSIS — I10 Essential (primary) hypertension: Secondary | ICD-10-CM

## 2021-09-30 DIAGNOSIS — E1165 Type 2 diabetes mellitus with hyperglycemia: Secondary | ICD-10-CM

## 2021-09-30 DIAGNOSIS — E782 Mixed hyperlipidemia: Secondary | ICD-10-CM

## 2021-10-01 ENCOUNTER — Telehealth: Payer: Self-pay | Admitting: Internal Medicine

## 2021-10-01 ENCOUNTER — Ambulatory Visit: Payer: Medicaid Other | Admitting: Urology

## 2021-10-01 NOTE — Telephone Encounter (Signed)
PA for repatha submitted online via [employer] portal  Confirmation #:[id number]

## 2021-10-08 ENCOUNTER — Other Ambulatory Visit: Payer: Medicaid Other | Admitting: Urology

## 2021-10-08 DIAGNOSIS — N1330 Unspecified hydronephrosis: Secondary | ICD-10-CM

## 2021-10-09 NOTE — Telephone Encounter (Signed)
Medication approved 10/01/2021 - 10/01/2022

## 2021-10-14 ENCOUNTER — Ambulatory Visit (INDEPENDENT_AMBULATORY_CARE_PROVIDER_SITE_OTHER): Payer: Medicaid Other | Admitting: Nurse Practitioner

## 2021-10-14 ENCOUNTER — Encounter: Payer: Self-pay | Admitting: Nurse Practitioner

## 2021-10-14 VITALS — BP 124/80 | HR 81 | Ht 60.0 in | Wt 177.8 lb

## 2021-10-14 DIAGNOSIS — I10 Essential (primary) hypertension: Secondary | ICD-10-CM | POA: Diagnosis not present

## 2021-10-14 DIAGNOSIS — E1165 Type 2 diabetes mellitus with hyperglycemia: Secondary | ICD-10-CM | POA: Diagnosis not present

## 2021-10-14 DIAGNOSIS — E782 Mixed hyperlipidemia: Secondary | ICD-10-CM | POA: Diagnosis not present

## 2021-10-14 LAB — POCT GLYCOSYLATED HEMOGLOBIN (HGB A1C): Hemoglobin A1C: 7.5 % — AB (ref 4.0–5.6)

## 2021-10-14 MED ORDER — DEXCOM G6 SENSOR MISC: 3 refills | Status: DC

## 2021-10-14 MED ORDER — ONDANSETRON HCL 4 MG PO TABS
4.0000 mg | ORAL_TABLET | Freq: Three times a day (TID) | ORAL | 0 refills | Status: DC | PRN
Start: 2021-10-14 — End: 2021-10-14

## 2021-10-14 MED ORDER — PROMETHAZINE HCL 12.5 MG PO TABS: 12.5000 mg | ORAL_TABLET | Freq: Three times a day (TID) | ORAL | 0 refills | Status: DC | PRN

## 2021-10-14 MED ORDER — OMNIPOD DASH PODS (GEN 4) MISC: 6 refills | Status: DC

## 2021-10-14 MED ORDER — INSULIN ASPART 100 UNIT/ML IJ SOLN: INTRAMUSCULAR | 6 refills | Status: DC

## 2021-10-14 MED ORDER — DEXCOM G6 TRANSMITTER MISC
1.0000 | 1 refills | Status: DC
Start: 2021-10-14 — End: 2022-03-26

## 2021-10-14 NOTE — Progress Notes (Signed)
10/14/2021, 4:46 PM                   Endocrinology follow-up note   Subjective:    Patient ID: Sandra [name], female    DOB: 04/12/1975.  Sandra [name] is being seen in follow-up  for management of currently uncontrolled symptomatic type 2  diabetes requested by  Coolidge Breeze, FNP.   Past Medical History:  Diagnosis Date   ADHD (attention deficit hyperactivity disorder)    Allergic rhinitis    Anemia    hx of   Barrett's esophagus    Bipolar 1 disorder (HCC)    Bladder tumor    CAD (coronary artery disease)    Cancer ([place])    bladder   Constipation    Dilated cardiomyopathy ([place]) 12/20/2020   Echo 6/22 ([facility]): EF 43, mild MR, mild TR // Echo 3/22: EF 50-55   Dizziness    Full dentures    GERD (gastroesophageal reflux disease)    Gross hematuria    Hyperlipidemia    Hypertension    Stable angina    Type 2 diabetes mellitus ([place])    Urgency of urination    dysuria, sui   Wears glasses     Past Surgical History:  Procedure Laterality Date   BALLOON DILATION N/A 02/17/2021   Procedure: BALLOON DILATION;  Surgeon: Eloise Harman, DO;  [facility];  Service: Endoscopy;  Laterality: N/A;   BIOPSY  02/17/2021   Procedure: BIOPSY;  Surgeon: Eloise Harman, DO;  [facility];  Service: Endoscopy;;   COLONOSCOPY WITH PROPOFOL N/A 02/17/2021   Surgeon: Eloise Harman, DO; nonbleeding internal hemorrhoids, otherwise normal exam.  Repeat in 10 years.   CYSTOSCOPY N/A 10/03/2015   Procedure: CYSTOSCOPY;  Surgeon: Irine Seal, MD;  Location: [facility];  Service: Urology;  Laterality: N/A;   CYSTOSCOPY W/ URETERAL STENT PLACEMENT Right 07/24/2021   Procedure: CYSTOSCOPY WITH RETROGRADE PYELOGRAM/URETERAL STENT PLACEMENT;  Surgeon: Cleon Gustin, MD;  Location: [facility];  Service: Urology;  Laterality: Right;   CYSTOSCOPY WITH RETROGRADE PYELOGRAM,  URETEROSCOPY AND STENT PLACEMENT Right 08/14/2021   Procedure: CYSTOSCOPY WITH RETROGRADE PYELOGRAM, URETEROSCOPY AND STENT EXCHANGE;  Surgeon: Cleon Gustin, MD;  Location: [facility];  Service: Urology;  Laterality: Right;   CYSTOSCOPY WITH STENT PLACEMENT Right 10/31/2015   Procedure: CYSTOSCOPY WITH ATTEMPTED RIGHT URETERAL OPENING;  Surgeon: Irine Seal, MD;  Location: [facility];  Service: Urology;  Laterality: Right;   ESOPHAGOGASTRODUODENOSCOPY (EGD) WITH PROPOFOL N/A 02/17/2021   Surgeon: Eloise Harman, DO; 2 cm hiatal hernia, Barrett's esophagus, gastritis with biopsies negative for H. pylori.  S/p empiric esophageal dilation.  Repeat in 3 years.   FOOT SURGERY Left    September 2022, broke 7 bones in left foot   INTERSTIM IMPLANT PLACEMENT N/A 03/14/2018   Procedure: Barrie Lyme IMPLANT FIRST STAGE;  Surgeon: Cleon Gustin, MD;  Location: [facility];  Service: Urology;  Laterality: N/A;   INTERSTIM IMPLANT PLACEMENT N/A 03/30/2018   Procedure: Barrie Lyme IMPLANT SECOND STAGE;  Surgeon: Cleon Gustin, MD;  Location: [facility];  Service: Urology;  Laterality: N/A;   LEFT HEART CATH AND CORONARY ANGIOGRAPHY N/A 05/31/2020   Procedure: LEFT HEART CATH AND CORONARY ANGIOGRAPHY;  Surgeon: Troy Sine, MD;  Location: [facility];  Service: Cardiovascular;  Laterality: N/A;   MULTIPLE TOOTH EXTRACTIONS  2015   OVARIAN CYST REMOVAL Right 2004 approx   SHOULDER OPEN ROTATOR CUFF REPAIR Left 04/20/2019   Procedure: ROTATOR CUFF REPAIR SHOULDER OPEN;  Surgeon: Carole Civil, MD;  Location: [facility];  Service: Orthopedics;  Laterality: Left;   TONSILLECTOMY  12/30/2004   TOTAL ABDOMINAL HYSTERECTOMY W/ BILATERAL SALPINGOOPHORECTOMY  05/16/2004   TRANSURETHRAL RESECTION OF BLADDER TUMOR N/A 10/03/2015   Procedure: TRANSURETHRAL RESECTION OF BLADDER TUMOR (TURBT);  Surgeon: Irine Seal, MD;  Location: [facility];  Service: Urology;  Laterality: N/A;    TRANSURETHRAL RESECTION OF BLADDER TUMOR N/A 10/31/2015   Procedure: RE-STAGINGG TRANSURETHRAL RESECTION OF BLADDER TUMOR (TURBT);  Surgeon: Irine Seal, MD;  Location: [facility];  Service: Urology;  Laterality: N/A;   TRANSURETHRAL RESECTION OF BLADDER TUMOR N/A 08/17/2016   Procedure: TRANSURETHRAL RESECTION OF BLADDER TUMOR (TURBT);  Surgeon: Cleon Gustin, MD;  Location: [facility];  Service: Urology;  Laterality: N/A;   URETERAL BIOPSY Right 08/14/2021   Procedure: URETERAL BIOPSY;  Surgeon: Cleon Gustin, MD;  Location: [facility];  Service: Urology;  Laterality: Right;    Social History   Socioeconomic History   Marital status: Married    Spouse name: Not on file   Number of children: Not on file   Years of education: Not on file   Highest education level: Not on file  Occupational History   Not on file  Tobacco Use   Smoking status: Former    Packs/day: 0.50    Years: 10.00    Total pack years: 5.00    Types: Cigarettes    Quit date: 01/31/2021    Years since quitting: 0.7   Smokeless tobacco: Never   Tobacco comments:    02/06/21-no smoking past 5 days  Vaping Use   Vaping Use: Never used  Substance and Sexual Activity   Alcohol use: No   Drug use: No   Sexual activity: Not Currently    Birth control/protection: Surgical    Comment: hyst  Other Topics Concern   Not on file  Social History Narrative   Not on file   Social Determinants of Health   Financial Resource Strain: Not on file  Food Insecurity: Not on file  Transportation Needs: Not on file  Physical Activity: Not on file  Stress: Not on file  Social Connections: Not on file    Family History  Problem Relation Age of Onset   Hypertension Mother    Cancer Mother    Breast cancer Maternal Aunt    Lung cancer Maternal Uncle    Cancer Other    Diabetes Daughter    Colon cancer Neg Hx     Outpatient Encounter Medications as of 10/14/2021  Medication Sig   ACCU-CHEK GUIDE  test strip USE AS INSTRUCTED TO MONITOR GLUCOSE 4 TIMES DAILY BEFORE MEALS AND BEFORE BED.   Accu-Chek Softclix Lancets lancets Use as instructed to monitor glucose 4 times daily   albuterol (PROAIR HFA) 108 (90 Base) MCG/ACT inhaler Inhale 2 puffs into the lungs every 6 (six) hours as needed for wheezing or shortness of breath.   aspirin EC 81 MG tablet Take 1 tablet (81 mg total) by mouth daily with breakfast. Swallow whole.  Blood Glucose Monitoring Suppl (ACCU-CHEK GUIDE) w/Device KIT 1 Piece by Does not apply route as directed.   Blood Pressure Monitoring (BLOOD PRESSURE CUFF) MISC See admin instructions.   buPROPion (WELLBUTRIN XL) 300 MG 24 hr tablet Take 300 mg by mouth every morning.   cloNIDine HCl (KAPVAY) 0.1 MG TB12 ER tablet 1 tablet at bedtime.   cyclobenzaprine (FLEXERIL) 10 MG tablet Take 1 tablet (10 mg total) by mouth 3 (three) times daily as needed for muscle spasms.   Evolocumab (REPATHA SURECLICK) 267 MG/ML SOAJ Inject 1 Dose into the skin every 14 (fourteen) days.   fenofibrate (TRICOR) 145 MG tablet Take 1 tablet (145 mg total) by mouth daily.   FLUoxetine (PROZAC) 40 MG capsule Take 40 mg by mouth daily.   gabapentin (NEURONTIN) 300 MG capsule TAKE 1 CAPSULE(300 MG) BY MOUTH THREE TIMES DAILY (Patient taking differently: Take 300 mg by mouth 3 (three) times daily.)   GLOBAL EASE INJECT PEN NEEDLES 31G X 5 MM MISC Inject into the skin.   HYDROcodone-acetaminophen (NORCO/VICODIN) 5-325 MG tablet Take 1 tablet by mouth every 6 (six) hours as needed.   hydrocortisone (ANUSOL-HC) 2.5 % rectal cream Place 1 application rectally 2 (two) times daily.   HYDROmorphone (DILAUDID) 4 MG tablet TAKE 1 TABLET EVERY 4 HOURS AS NEEDED FOR SEVERE PAIN.   HYDROmorphone (DILAUDID) 4 MG tablet Take 1 tablet (4 mg total) by mouth every 4 (four) hours as needed for severe pain.   hydrOXYzine (ATARAX) 25 MG tablet Take 1 tablet (25 mg total) by mouth 3 (three) times daily as needed for itching or  anxiety.   Insulin Pen Needle (PEN NEEDLES) 31G X 6 MM MISC Use to inject insulin 4 times daily   INSULIN SYRINGE 1CC/29G 29G X 1/2" 1 ML MISC Use to inject insulin into Omnipod   linaclotide (LINZESS) 72 MCG capsule Take 1 capsule (72 mcg total) by mouth daily before breakfast.   pantoprazole (PROTONIX) 40 MG tablet Take 1 tablet (40 mg total) by mouth 2 (two) times daily before a meal.   promethazine (PHENERGAN) 12.5 MG tablet Take 1 tablet (12.5 mg total) by mouth every 8 (eight) hours as needed for nausea or vomiting.   rosuvastatin (CRESTOR) 40 MG tablet Take 1 tablet (40 mg total) by mouth daily.   tamsulosin (FLOMAX) 0.4 MG CAPS capsule Take 1 capsule (0.4 mg total) by mouth at bedtime.   VYVANSE 40 MG capsule Take 40 mg by mouth every morning.   [DISCONTINUED] Continuous Blood Gluc Sensor (DEXCOM G6 SENSOR) MISC Apply new sensor every 10 days as directed   [DISCONTINUED] Continuous Blood Gluc Transmit (DEXCOM G6 TRANSMITTER) MISC 1 Device by Does not apply route every 3 (three) months.   [DISCONTINUED] insulin aspart (NOVOLOG) 100 UNIT/ML injection Use with Omnipod for TDD around 150 units daily   [DISCONTINUED] Insulin Disposable Pump (OMNIPOD DASH PODS, GEN 4,) MISC Change pod every 48-72 hrs as directed   [DISCONTINUED] ondansetron (ZOFRAN) 4 MG tablet Take 1 tablet (4 mg total) by mouth every 8 (eight) hours as needed for nausea or vomiting.   Continuous Blood Gluc Sensor (DEXCOM G6 SENSOR) MISC Apply new sensor every 10 days as directed   Continuous Blood Gluc Transmit (DEXCOM G6 TRANSMITTER) MISC 1 Device by Does not apply route every 3 (three) months.   insulin aspart (NOVOLOG) 100 UNIT/ML injection Use with Omnipod for TDD around 150 units daily   Insulin Disposable Pump (OMNIPOD DASH PODS, GEN 4,) MISC Change pod every 48-72  hrs as directed   nitroGLYCERIN (NITROSTAT) 0.4 MG SL tablet Place 1 tablet (0.4 mg total) under the tongue every 5 (five) minutes as needed for chest pain.    Facility-Administered Encounter Medications as of 10/14/2021  Medication   betamethasone acetate-betamethasone sodium phosphate (CELESTONE) injection 3 mg    ALLERGIES: Allergies  Allergen Reactions   Ibuprofen Hives    itching itching itching   Benazepril Hives   Diphenhydramine Hives    Other reaction(s): hives Other reaction(s): hives   Latex Dermatitis    States she had redness to skin and itching    Metronidazole Nausea And Vomiting    Other reaction(s): nausea Other reaction(s): nausea   Oxycodone     Pt has tolerated hydromorphone in the past and takes Norco at home.   Toradol [Ketorolac Tromethamine] Itching   Adhesive [Tape] Rash   Codeine Nausea And Vomiting and Rash    Other reaction(s): hives   Loratadine Rash   Nystatin-Triamcinolone Rash   Penicillins Rash    Has patient had a PCN reaction causing immediate rash, facial/tongue/throat swelling, SOB or lightheadedness with hypotension: No Has patient had a PCN reaction causing severe rash involving mucus membranes or skin necrosis: Yes Has patient had a PCN reaction that required hospitalization: No Has patient had a PCN reaction occurring within the last 10 years: no  If all of the above answers are "NO", then may proceed with Cephalosporin use. Other reaction(s): nausea Received ancef w/o issue (7/23)    VACCINATION STATUS: Immunization History  Administered Date(s) Administered   Influenza,inj,Quad PF,6+ Mos 11/01/2015    Diabetes She presents for her follow-up diabetic visit. She has type 2 diabetes mellitus. Onset time: She was diagnosed at approximate age of 66 years with A1c of greater than 15% Her disease course has been fluctuating. There are no hypoglycemic associated symptoms. Pertinent negatives for hypoglycemia include no confusion, headaches, pallor or seizures. Associated symptoms include foot paresthesias. Pertinent negatives for diabetes include no blurred vision, no chest pain, no  fatigue, no polydipsia, no polyphagia and no polyuria. There are no hypoglycemic complications. Symptoms are stable. Diabetic complications include peripheral neuropathy. Risk factors for coronary artery disease include dyslipidemia, diabetes mellitus, hypertension, sedentary lifestyle and tobacco exposure. Current diabetic treatment includes insulin pump. She is compliant with treatment most of the time. Her weight is increasing steadily. She is following a generally unhealthy diet. When asked about meal planning, she reported none. She has not had a previous visit with a dietitian. She rarely participates in exercise. Her home blood glucose trend is fluctuating minimally. Her overall blood glucose range is 140-180 mg/dl. (She presents today with her CGM and Omnipod showing slightly above target glycemic profile overall.  Her POCT A1c today is 7.5%, improving from last visit of 8.1%.  She notes she had to get a new PDM for her Omnipod as her grandson threw her old one in the toilet.  She also states she is under great amount of family stress.  Analysis of her CGM shows TIR 56%, TAR 43%, TBR <1% with a GMI of 7.6%.) An ACE inhibitor/angiotensin II receptor blocker is not being taken. She does not see a podiatrist.Eye exam is current.  Hyperlipidemia This is a chronic problem. The current episode started more than 1 year ago. The problem is uncontrolled. Recent lipid tests were reviewed and are high. Exacerbating diseases include diabetes. Factors aggravating her hyperlipidemia include beta blockers and fatty foods. Pertinent negatives include no chest pain, myalgias or shortness of  breath. Current antihyperlipidemic treatment includes fibric acid derivatives and statins. The current treatment provides mild improvement of lipids. Compliance problems include adherence to diet and adherence to exercise.  Risk factors for coronary artery disease include diabetes mellitus, dyslipidemia, a sedentary lifestyle, family  history and hypertension.  Hypertension This is a chronic problem. The current episode started more than 1 year ago. The problem is uncontrolled. Pertinent negatives include no blurred vision, chest pain, headaches, palpitations or shortness of breath. There are no associated agents to hypertension. Risk factors for coronary artery disease include dyslipidemia, diabetes mellitus and sedentary lifestyle. Past treatments include calcium channel blockers and beta blockers. The current treatment provides mild improvement. Compliance problems include diet and exercise.  Hypertensive end-organ damage includes CAD/MI.    Review of systems  Constitutional: + steadily increasing body weight,  current Body mass index is 34.72 kg/m. , no fatigue, no subjective hyperthermia, no subjective hypothermia Eyes: no blurry vision, no xerophthalmia ENT: no sore throat, no nodules palpated in throat, no dysphagia/odynophagia, no hoarseness Cardiovascular: no chest pain, no shortness of breath, no palpitations, no leg swelling Respiratory: no cough, no shortness of breath Gastrointestinal: + nausea and vomiting for last 4 days- stress related? Due to family stressors Musculoskeletal: no muscle/joint aches Skin: no rashes, no hyperemia Neurological: no tremors, + numbness/tingling to BLE, no dizziness Psychiatric: no depression, no anxiety   Objective:    BP 124/80 (BP Location: Right Arm, Patient Position: Sitting, Cuff Size: Normal)   Pulse 81   Ht 5' (1.524 m)   Wt 177 lb 12.8 oz (80.6 kg)   BMI 34.72 kg/m   Wt Readings from Last 3 Encounters:  10/14/21 177 lb 12.8 oz (80.6 kg)  08/31/21 170 lb (77.1 kg)  08/28/21 178 lb 12.8 oz (81.1 kg)    BP Readings from Last 3 Encounters:  10/14/21 124/80  09/12/21 (!) 162/80  08/31/21 (!) 149/77     Physical Exam- Limited  Constitutional:  Body mass index is 34.72 kg/m. , not in acute distress, became tearful during visit. Eyes:  EOMI, no  exophthalmos Neck: Supple Cardiovascular: RRR, no murmurs, rubs, or gallops, no edema Respiratory: Adequate breathing efforts, no crackles, rales, rhonchi, or wheezing Musculoskeletal: no gross deformities, strength intact in all four extremities, no gross restriction of joint movements Skin:  no rashes, no hyperemia Neurological: no tremor with outstretched hands   Diabetic Foot Exam - Simple   No data filed    CMP ( most recent) CMP     Component Value Date/Time   NA 133 (L) 07/26/2021 1724   NA 136 12/20/2020 1208   K 4.6 07/26/2021 1724   CL 100 07/26/2021 1724   CO2 24 07/26/2021 1724   GLUCOSE 482 (H) 07/26/2021 1724   BUN 14 07/26/2021 1724   BUN 10 12/20/2020 1208   CREATININE 1.03 (H) 07/26/2021 1724   CALCIUM 9.1 07/26/2021 1724   PROT 7.3 07/26/2021 1742   PROT 6.6 12/18/2020 0834   ALBUMIN 3.7 07/26/2021 1742   ALBUMIN 4.1 12/18/2020 0834   AST 12 (L) 07/26/2021 1742   ALT 19 07/26/2021 1742   ALKPHOS 89 07/26/2021 1742   BILITOT 0.4 07/26/2021 1742   BILITOT 0.2 12/18/2020 0834   GFRNONAA >60 07/26/2021 1724   GFRAA >60 04/17/2019 1427     Diabetic Labs (most recent): Lab Results  Component Value Date   HGBA1C 7.5 (A) 10/14/2021   HGBA1C 8.1 06/24/2021   HGBA1C 7.2 (A) 02/24/2021   MICROALBUR 80 06/24/2021  MICROALBUR 80 07/10/2020      Assessment & Plan:   1) Uncontrolled type 2 diabetes mellitus with hyperglycemia ([place])  - Robynn L Toda has currently uncontrolled symptomatic type 2 DM since  46 years of age.  She presents today with her CGM and Omnipod showing slightly above target glycemic profile overall.  Her POCT A1c today is 7.5%, improving from last visit of 8.1%.  She notes she had to get a new PDM for her Omnipod as her grandson threw her old one in the toilet.  She also states she is under great amount of family stress.  Analysis of her CGM shows TIR 56%, TAR 43%, TBR <1% with a GMI of 7.6%.  - I had a long discussion with her  about the progressive nature of diabetes and the pathology behind its complications. -her diabetes is complicated by chronic smoking and she remains at a high risk for more acute and chronic complications which include CAD, CVA, CKD, retinopathy, and neuropathy. These are all discussed in detail with her.  - Nutritional counseling repeated at each appointment due to patients tendency to fall back in to old habits.  - The patient admits there is a room for improvement in their diet and drink choices. -  Suggestion is made for the patient to avoid simple carbohydrates from their diet including Cakes, Sweet Desserts / Pastries, Ice Cream, Soda (diet and regular), Sweet Tea, Candies, Chips, Cookies, Sweet Pastries, Store Bought Juices, Alcohol in Excess of 1-2 drinks a day, Artificial Sweeteners, Coffee Creamer, and "Sugar-free" Products. This will help patient to have stable blood glucose profile and potentially avoid unintended weight gain.   - I encouraged the patient to switch to unprocessed or minimally processed complex starch and increased protein intake (animal or plant source), fruits, and vegetables.   - Patient is advised to stick to a routine mealtimes to eat 3 meals a day and avoid unnecessary snacks (to snack only to correct hypoglycemia).  - she will be scheduled with Jearld Fenton, RDN, CDE for diabetes education.  - I have approached her with the following individualized plan to manage  her diabetes and patient agrees:   -I did make slight adjustment to her Omnipod DASH settings, changing her Target BG to 110 and increasing her insulin carb ratio to 1:10 instead of 1:8 due to postprandial drops from over-calculation.    -She is encouraged to continue monitoring blood glucose 4 times daily (using her CGM), before meals and at bedtime, and to call the clinic if she has readings less than 70 or greater than 300 for 3 readings in a row.   - Specific targets for  A1c;  LDL, HDL,  Triglycerides, were discussed with the patient.  2) Blood Pressure /Hypertension: Her blood pressure is not controlled to target. She is allergic to ACE inhibitors. She has a history of being a chronic heavy smoker, quit in 2021.  She is advised to continue her current medications including Metoprolol 50 mg po daily and Norvasc 5 mg po daily.   3) Lipids/Hyperlipidemia:  Her most recent lipid panel from 12/18/20 shows controlled LDL of 99 and elevated triglycerides of 165.  She is advised to continue her Crestor 40 mg po daily at bedtime, Repatha 140 mg po every 14 days, and Tricor 145 mg po daily.  Side effects and precautions discussed with her.    4)  Weight/Diet:  Her Body mass index is 34.72 kg/m.-clearly complicating her diabetes care.  She is a  candidate for modest weight loss.  I discussed with her the fact that loss of 5 - 10% of her  current body weight will have the most impact on her diabetes management.  Exercise, and detailed carbohydrates information provided  -  detailed on discharge instructions.  5) Chronic Care/Health Maintenance: -she is not on ACEI/ARB and is on Statin medications and is encouraged to initiate and continue to follow up with Ophthalmology, Dentist,  Podiatrist at least yearly or according to recommendations, and advised to stay away from smoking. I have recommended yearly flu vaccine and pneumonia vaccine at least every 5 years; moderate intensity exercise for up to 150 minutes weekly; and  sleep for at least 7 hours a day.  - she is advised to maintain close follow up with Coolidge Breeze, FNP for primary care needs, as well as her other providers for optimal and coordinated care.  -I did give her temporary prescription for Phenergan 12.5 mg every 8 hours to help with her nausea/vomiting.  She is aware that she will need to see PCP moving forward for this.     I spent 38 minutes in the care of the patient today including review of labs from [place], Lipids,  Thyroid Function, Hematology (current and previous including abstractions from other facilities); face-to-face time discussing  her blood glucose readings/logs, discussing hypoglycemia and hyperglycemia episodes and symptoms, medications doses, her options of short and long term treatment based on the latest standards of care / guidelines;  discussion about incorporating lifestyle medicine;  and documenting the encounter. Risk reduction counseling performed per USPSTF guidelines to reduce obesity and cardiovascular risk factors.     Please refer to Patient Instructions for Blood Glucose Monitoring and Insulin/Medications Dosing Guide"  in media tab for additional information. Please  also refer to " Patient Self Inventory" in the Media  tab for reviewed elements of pertinent patient history.  Sandra [name] participated in the discussions, expressed understanding, and voiced agreement with the above plans.  All questions were answered to her satisfaction. she is encouraged to contact clinic should she have any questions or concerns prior to her return visit.   Follow up plan: - Return in about 3 months (around 01/14/2022) for Diabetes F/U with A1c in office, No previsit labs, Bring meter and logs.   Rayetta Pigg, [facility] [address] [place], [place] [address] Phone: [phone] Fax: [phone]  10/14/2021, 4:46 PM

## 2021-10-30 ENCOUNTER — Other Ambulatory Visit: Payer: Self-pay | Admitting: Nurse Practitioner

## 2021-10-30 MED ORDER — OMNIPOD DASH PODS (GEN 4) MISC
6 refills | Status: DC
Start: 2021-10-30 — End: 2022-01-14

## 2021-11-03 ENCOUNTER — Ambulatory Visit (INDEPENDENT_AMBULATORY_CARE_PROVIDER_SITE_OTHER): Payer: Medicaid Other | Admitting: Urology

## 2021-11-03 DIAGNOSIS — T839XXA Unspecified complication of genitourinary prosthetic device, implant and graft, initial encounter: Secondary | ICD-10-CM | POA: Diagnosis not present

## 2021-11-03 DIAGNOSIS — N3281 Overactive bladder: Secondary | ICD-10-CM

## 2021-11-03 DIAGNOSIS — Z8551 Personal history of malignant neoplasm of bladder: Secondary | ICD-10-CM

## 2021-11-03 LAB — POCT URINALYSIS DIPSTICK
Bilirubin, UA: NEGATIVE
Blood, UA: NEGATIVE
Glucose, UA: NEGATIVE
Ketones, UA: NEGATIVE
Nitrite, UA: NEGATIVE
Protein, UA: POSITIVE — AB
Spec Grav, UA: >=1.03 — AB (ref 1.010–1.025)
Urobilinogen, UA: 0.2 E.U./dL
pH, UA: 5 (ref 5.0–8.0)

## 2021-11-03 MED ORDER — CIPROFLOXACIN HCL 500 MG PO TABS
500.0000 mg | ORAL_TABLET | Freq: Once | ORAL | Status: AC
  Administered 2021-11-03: 500 mg via ORAL

## 2021-11-03 NOTE — Progress Notes (Signed)
11/03/2021 11:00 AM   Sandra [name] [birth year] [id number]  Referring provider: [facility], [address],  [place] [id number]  Followup OAB   HPI: [name] is a 46yo here for followup for OAB. She notes 2 weeks ago she stopped feeling her interstim and she has urinary frequency and incontinence which is similar prior to interstim placement. No other complaints today   PMH: Past Medical History:  Diagnosis Date   ADHD (attention deficit hyperactivity disorder)    Allergic rhinitis    Anemia    hx of   Barrett's esophagus    Bipolar 1 disorder (HCC)    Bladder tumor    CAD (coronary artery disease)    Cancer ([place])    bladder   Constipation    Dilated cardiomyopathy ([place]) 12/20/2020   Echo 6/22 ([facility]): EF 43, mild MR, mild TR // Echo 3/22: EF 50-55   Dizziness    Full dentures    GERD (gastroesophageal reflux disease)    Gross hematuria    Hyperlipidemia    Hypertension    Stable angina    Type 2 diabetes mellitus (HCC)    Urgency of urination    dysuria, sui   Wears glasses     Surgical History: Past Surgical History:  Procedure Laterality Date   BALLOON DILATION N/A 02/17/2021   Procedure: BALLOON DILATION;  Surgeon: Eloise Harman, DO;  [facility];  Service: Endoscopy;  Laterality: N/A;   BIOPSY  02/17/2021   Procedure: BIOPSY;  Surgeon: Eloise Harman, DO;  [facility];  Service: Endoscopy;;   COLONOSCOPY WITH PROPOFOL N/A 02/17/2021   Surgeon: Eloise Harman, DO; nonbleeding internal hemorrhoids, otherwise normal exam.  Repeat in 10 years.   CYSTOSCOPY N/A 10/03/2015   Procedure: CYSTOSCOPY;  Surgeon: Irine Seal, MD;  [facility];  Service: Urology;  Laterality: N/A;   CYSTOSCOPY W/ URETERAL STENT PLACEMENT Right 07/24/2021   Procedure: CYSTOSCOPY WITH RETROGRADE PYELOGRAM/URETERAL STENT PLACEMENT;  Surgeon: Cleon Gustin, MD;  Location: [facility];  Service: Urology;  Laterality:  Right;   CYSTOSCOPY WITH RETROGRADE PYELOGRAM, URETEROSCOPY AND STENT PLACEMENT Right 08/14/2021   Procedure: CYSTOSCOPY WITH RETROGRADE PYELOGRAM, URETEROSCOPY AND STENT EXCHANGE;  Surgeon: Cleon Gustin, MD;  Location: [facility];  Service: Urology;  Laterality: Right;   CYSTOSCOPY WITH STENT PLACEMENT Right 10/31/2015   Procedure: CYSTOSCOPY WITH ATTEMPTED RIGHT URETERAL OPENING;  Surgeon: Irine Seal, MD;  Location: [facility];  Service: Urology;  Laterality: Right;   ESOPHAGOGASTRODUODENOSCOPY (EGD) WITH PROPOFOL N/A 02/17/2021   Surgeon: Eloise Harman, DO; 2 cm hiatal hernia, Barrett's esophagus, gastritis with biopsies negative for H. pylori.  S/p empiric esophageal dilation.  Repeat in 3 years.   FOOT SURGERY Left    September 2022, broke 7 bones in left foot   INTERSTIM IMPLANT PLACEMENT N/A 03/14/2018   Procedure: Barrie Lyme IMPLANT FIRST STAGE;  Surgeon: Cleon Gustin, MD;  Location: [facility];  Service: Urology;  Laterality: N/A;   INTERSTIM IMPLANT PLACEMENT N/A 03/30/2018   Procedure: Barrie Lyme IMPLANT SECOND STAGE;  Surgeon: Cleon Gustin, MD;  Location[facility];  Service: Urology;  Laterality: N/A;   LEFT HEART CATH AND CORONARY ANGIOGRAPHY N/A 05/31/2020   Procedure: LEFT HEART CATH AND CORONARY ANGIOGRAPHY;  Surgeon: Troy Sine, MD;  Location: [facility];  Service: Cardiovascular;  Laterality: N/A;   MULTIPLE TOOTH EXTRACTIONS  2015   OVARIAN CYST  REMOVAL Right 2004 approx   SHOULDER OPEN ROTATOR CUFF REPAIR Left 04/20/2019   Procedure: ROTATOR CUFF REPAIR SHOULDER OPEN;  Surgeon: Carole Civil, MD;  Location: [facility];  Service: Orthopedics;  Laterality: Left;   TONSILLECTOMY  12/30/2004   TOTAL ABDOMINAL HYSTERECTOMY W/ BILATERAL SALPINGOOPHORECTOMY  05/16/2004   TRANSURETHRAL RESECTION OF BLADDER TUMOR N/A 10/03/2015   Procedure: TRANSURETHRAL RESECTION OF BLADDER TUMOR (TURBT);  Surgeon: Irine Seal, MD;  Location: [facility];  Service: Urology;  Laterality: N/A;   TRANSURETHRAL RESECTION OF BLADDER TUMOR N/A 10/31/2015   Procedure: RE-STAGINGG TRANSURETHRAL RESECTION OF BLADDER TUMOR (TURBT);  Surgeon: Irine Seal, MD;  Location: [facility];  Service: Urology;  Laterality: N/A;   TRANSURETHRAL RESECTION OF BLADDER TUMOR N/A 08/17/2016   Procedure: TRANSURETHRAL RESECTION OF BLADDER TUMOR (TURBT);  Surgeon: Cleon Gustin, MD;  [facility];  Service: Urology;  Laterality: N/A;   URETERAL BIOPSY Right 08/14/2021   Procedure: URETERAL BIOPSY;  Surgeon: Cleon Gustin, MD;  Location: [facility];  Service: Urology;  Laterality: Right;    Home Medications:  Allergies as of 11/03/2021       Reactions   Ibuprofen Hives   itching itching itching   Benazepril Hives   Diphenhydramine Hives   Other reaction(s): hives Other reaction(s): hives   Latex Dermatitis   States she had redness to skin and itching    Metronidazole Nausea And Vomiting   Other reaction(s): nausea Other reaction(s): nausea   Oxycodone    Pt has tolerated hydromorphone in the past and takes Norco at home.   Toradol [ketorolac Tromethamine] Itching   Adhesive [tape] Rash   Codeine Nausea And Vomiting, Rash   Other reaction(s): hives   Loratadine Rash   Nystatin-triamcinolone Rash   Penicillins Rash   Has patient had a PCN reaction causing immediate rash, facial/tongue/throat swelling, SOB or lightheadedness with hypotension: No Has patient had a PCN reaction causing severe rash involving mucus membranes or skin necrosis: Yes Has patient had a PCN reaction that required hospitalization: No Has patient had a PCN reaction occurring within the last 10 years: no If all of the above answers are "NO", then may proceed with Cephalosporin use. Other reaction(s): nausea Received ancef w/o issue (7/23)        Medication List        Accurate as of November 03, 2021 11:00 AM. If you have any questions, ask  your nurse or doctor.          Accu-Chek Guide test strip Generic drug: glucose blood USE AS INSTRUCTED TO MONITOR GLUCOSE 4 TIMES DAILY BEFORE MEALS AND BEFORE BED.   Accu-Chek Guide w/Device Kit 1 Piece by Does not apply route as directed.   Accu-Chek Softclix Lancets lancets Use as instructed to monitor glucose 4 times daily   albuterol 108 (90 Base) MCG/ACT inhaler Commonly known as: ProAir HFA Inhale 2 puffs into the lungs every 6 (six) hours as needed for wheezing or shortness of breath.   aspirin EC 81 MG tablet Take 1 tablet (81 mg total) by mouth daily with breakfast. Swallow whole.   Blood Pressure Cuff Misc See admin instructions.   buPROPion 300 MG 24 hr tablet Commonly known as: WELLBUTRIN XL Take 300 mg by mouth every morning.   cloNIDine HCl 0.1 MG Tb12 ER tablet Commonly known as: KAPVAY 1 tablet at bedtime.   cyclobenzaprine 10 MG tablet Commonly known as: FLEXERIL Take 1 tablet (10 mg total) by mouth  3 (three) times daily as needed for muscle spasms.   Dexcom G6 Sensor Misc Apply new sensor every 10 days as directed   Dexcom G6 Transmitter Misc 1 Device by Does not apply route every 3 (three) months.   fenofibrate 145 MG tablet Commonly known as: TRICOR Take 1 tablet (145 mg total) by mouth daily.   FLUoxetine 40 MG capsule Commonly known as: PROZAC Take 40 mg by mouth daily.   gabapentin 300 MG capsule Commonly known as: NEURONTIN TAKE 1 CAPSULE(300 MG) BY MOUTH THREE TIMES DAILY What changed: See the new instructions.   HYDROcodone-acetaminophen 5-325 MG tablet Commonly known as: NORCO/VICODIN Take 1 tablet by mouth every 6 (six) hours as needed.   hydrocortisone 2.5 % rectal cream Commonly known as: ANUSOL-HC Place 1 application rectally 2 (two) times daily.   HYDROmorphone 4 MG tablet Commonly known as: DILAUDID TAKE 1 TABLET EVERY 4 HOURS AS NEEDED FOR SEVERE PAIN.   HYDROmorphone 4 MG tablet Commonly known as:  DILAUDID Take 1 tablet (4 mg total) by mouth every 4 (four) hours as needed for severe pain.   hydrOXYzine 25 MG tablet Commonly known as: ATARAX Take 1 tablet (25 mg total) by mouth 3 (three) times daily as needed for itching or anxiety.   insulin aspart 100 UNIT/ML injection Commonly known as: NovoLOG Use with Omnipod for TDD around 150 units daily   INSULIN SYRINGE 1CC/29G 29G X 1/2" 1 ML Misc Use to inject insulin into Omnipod   linaclotide 72 MCG capsule Commonly known as: Linzess Take 1 capsule (72 mcg total) by mouth daily before breakfast.   nitroGLYCERIN 0.4 MG SL tablet Commonly known as: NITROSTAT Place 1 tablet (0.4 mg total) under the tongue every 5 (five) minutes as needed for chest pain.   Omnipod DASH Pods (Gen 4) Misc Change pod every 48 hrs as directed   pantoprazole 40 MG tablet Commonly known as: PROTONIX Take 1 tablet (40 mg total) by mouth 2 (two) times daily before a meal.   Pen Needles 31G X 6 MM Misc Use to inject insulin 4 times daily   Global Ease Inject Pen Needles 31G X 5 MM Misc Generic drug: Insulin Pen Needle Inject into the skin.   promethazine 12.5 MG tablet Commonly known as: PHENERGAN Take 1 tablet (12.5 mg total) by mouth every 8 (eight) hours as needed for nausea or vomiting.   Repatha SureClick 725 MG/ML Soaj Generic drug: Evolocumab Inject 1 Dose into the skin every 14 (fourteen) days.   rosuvastatin 40 MG tablet Commonly known as: CRESTOR Take 1 tablet (40 mg total) by mouth daily.   tamsulosin 0.4 MG Caps capsule Commonly known as: FLOMAX Take 1 capsule (0.4 mg total) by mouth at bedtime.   Vyvanse 40 MG capsule Generic drug: lisdexamfetamine Take 40 mg by mouth every morning.        Allergies:  Allergies  Allergen Reactions   Ibuprofen Hives    itching itching itching   Benazepril Hives   Diphenhydramine Hives    Other reaction(s): hives Other reaction(s): hives   Latex Dermatitis    States she had  redness to skin and itching    Metronidazole Nausea And Vomiting    Other reaction(s): nausea Other reaction(s): nausea   Oxycodone     Pt has tolerated hydromorphone in the past and takes Norco at home.   Toradol [Ketorolac Tromethamine] Itching   Adhesive [Tape] Rash   Codeine Nausea And Vomiting and Rash    Other reaction(s):  hives   Loratadine Rash   Nystatin-Triamcinolone Rash   Penicillins Rash    Has patient had a PCN reaction causing immediate rash, facial/tongue/throat swelling, SOB or lightheadedness with hypotension: No Has patient had a PCN reaction causing severe rash involving mucus membranes or skin necrosis: Yes Has patient had a PCN reaction that required hospitalization: No Has patient had a PCN reaction occurring within the last 10 years: no  If all of the above answers are "NO", then may proceed with Cephalosporin use. Other reaction(s): nausea Received ancef w/o issue (7/23)    Family History: Family History  Problem Relation Age of Onset   Hypertension Mother    Cancer Mother    Breast cancer Maternal Aunt    Lung cancer Maternal Uncle    Cancer Other    Diabetes Daughter    Colon cancer Neg Hx     Social History:  reports that she quit smoking about 9 months ago. Her smoking use included cigarettes. She has a 5.00 pack-year smoking history. She has never used smokeless tobacco. She reports that she does not drink alcohol and does not use drugs.  ROS: All other review of systems were reviewed and are negative except what is noted above in HPI  Physical Exam: There were no vitals taken for this visit.  Constitutional:  Alert and oriented, No acute distress. HEENT: [place] AT, moist mucus membranes.  Trachea midline, no masses. Cardiovascular: No clubbing, cyanosis, or edema. Respiratory: Normal respiratory effort, no increased work of breathing. GI: Abdomen is soft, nontender, nondistended, no abdominal masses GU: No CVA tenderness.  Lymph: No cervical  or inguinal lymphadenopathy. Skin: No rashes, bruises or suspicious lesions. Neurologic: Grossly intact, no focal deficits, moving all 4 extremities. Psychiatric: Normal mood and affect.  Laboratory Data: Lab Results  Component Value Date   WBC 12.4 (H) 07/26/2021   HGB 11.6 (L) 07/26/2021   HCT 37.9 07/26/2021   MCV 82.8 07/26/2021   PLT 384 07/26/2021    Lab Results  Component Value Date   CREATININE 1.03 (H) 07/26/2021    No results found for: "PSA"  No results found for: "TESTOSTERONE"  Lab Results  Component Value Date   HGBA1C 7.5 (A) 10/14/2021    Urinalysis    Component Value Date/Time   COLORURINE YELLOW 07/26/2021 1739   APPEARANCEUR Cloudy (A) 09/12/2021 1131   LABSPEC 1.022 07/26/2021 1739   PHURINE 7.0 07/26/2021 1739   GLUCOSEU Negative 09/12/2021 1131   HGBUR LARGE (A) 07/26/2021 1739   BILIRUBINUR neg 11/03/2021 1019   BILIRUBINUR Negative 09/12/2021 1131   KETONESUR NEGATIVE 07/26/2021 1739   PROTEINUR Positive (A) 11/03/2021 1019   PROTEINUR 1+ (A) 09/12/2021 1131   PROTEINUR 100 (A) 07/26/2021 1739   UROBILINOGEN 0.2 11/03/2021 1019   UROBILINOGEN 0.2 03/01/2011 1920   NITRITE neg 11/03/2021 1019   NITRITE Negative 09/12/2021 1131   NITRITE NEGATIVE 07/26/2021 1739   LEUKOCYTESUR Small (1+) (A) 11/03/2021 1019   LEUKOCYTESUR Negative 09/12/2021 1131   LEUKOCYTESUR TRACE (A) 07/26/2021 1739    Lab Results  Component Value Date   LABMICR Comment 09/12/2021   WBCUA 0-5 06/18/2021   LABEPIT 0-10 06/18/2021   MUCUS Present 06/18/2021   BACTERIA NONE SEEN 07/26/2021    Pertinent Imaging:  No results found for this or any previous visit.  No results found for this or any previous visit.  No results found for this or any previous visit.  No results found for this or any previous visit.  Results for orders placed during the hospital encounter of 09/04/21  US RENAL  Narrative CLINICAL DATA:  Follow-up known right renal  calcification.  EXAM: RENAL / URINARY TRACT ULTRASOUND COMPLETE  COMPARISON:  July 21, 2021 renal ultrasound  FINDINGS: Right Kidney:  Renal measurements: 10.4 x 4.3 x 5.7 cm = volume: 133 mL. Echogenicity within normal limits. No mass or hydronephrosis visualized.  Left Kidney:  Renal measurements: 11.9 x 5.7 x 4.8 cm = volume: 172 mL. Echogenicity within normal limits. No mass or hydronephrosis visualized.  Bladder:  Appears normal for degree of bladder distention.  Other:  None.  IMPRESSION: No renal stones or obstruction identified. The bladder is unremarkable.   Electronically Signed By: Dorise Bullion III M.D. On: 09/04/2021 17:00  No valid procedures specified. No results found for this or any previous visit.  Results for orders placed in visit on 08/25/21  CT RENAL STONE STUDY  Narrative CLINICAL DATA:  Right-sided flank pain hematuria for 6 weeks.  EXAM: CT ABDOMEN AND PELVIS WITHOUT CONTRAST  TECHNIQUE: Multidetector CT imaging of the abdomen and pelvis was performed following the standard protocol without IV contrast.  RADIATION DOSE REDUCTION: This exam was performed according to the departmental dose-optimization program which includes automated exposure control, adjustment of the mA and/or kV according to patient size and/or use of iterative reconstruction technique.  COMPARISON:  Prior CT scans 07/26/2021 and 07/18/2021  FINDINGS: Lower chest: The lung bases are clear of acute process. No pleural effusion or pulmonary lesions. The heart is normal in size. No pericardial effusion. The distal esophagus and aorta are unremarkable.  Hepatobiliary: No focal hepatic lesions or intrahepatic biliary dilatation. The gallbladder is normal. No common bile duct dilatation.  Pancreas: No mass, inflammation or ductal dilatation.  Spleen: Normal size. No focal lesions.  Adrenals/Urinary Tract: Adrenal glands are normal.  The right-sided  double-J ureteral stent is been removed. There is very mild right-sided hydroureter but no obstructing ureteral calculi.  Stomach/Bowel: The stomach, duodenum, small bowel and colon are grossly normal without oral contrast. No inflammatory changes, mass lesions or obstructive findings.  Vascular/Lymphatic: Age advanced atherosclerotic calcifications involving the aorta and iliac arteries. No mesenteric or retroperitoneal mass or adenopathy.  Reproductive: The uterus and ovaries are is surgically absent.  Other: Stable small periumbilical abdominal hernia containing fat. AP left-sided sacral nerve root stimulator is noted.  Musculoskeletal: No significant bony findings.  IMPRESSION: 1. Interval removal of the right-sided double-J ureteral stent with very mild right-sided hydroureter but no obstructing ureteral calculi. 2. No acute abdominal/pelvic findings, mass lesions or adenopathy. 3. Age advanced atherosclerotic calcifications involving the aorta and iliac arteries.  Aortic Atherosclerosis (ICD10-I70.0).   Electronically Signed By: Marijo Sanes M.D. On: 09/04/2021 11:39   Assessment & Plan:    1. OAB with malfunctioning interstim We will schedule for generator replacement. Risks/benefits/alternatives discussed - POCT urinalysis dipstick - ciprofloxacin (CIPRO) tablet 500 mg   No follow-ups on file.  [name], MD  [facility] [place]         11/03/21   There were no vitals taken for this visit. NED. A&Ox3.   No respiratory distress   Abd soft, NT, ND Normal external genitalia with patent urethral meatus  Cystoscopy Procedure Note  Patient identification was confirmed, informed consent was obtained, and patient was prepped using Betadine solution.  Lidocaine jelly was administered per urethral meatus.    Procedure: - Flexible cystoscope introduced, without any difficulty.   - Thorough search of the  bladder revealed:    normal  urethral meatus    normal urothelium    no stones    no ulcers     no tumors    no urethral polyps    no trabeculation  - Ureteral orifices were normal in position and appearance.  Post-Procedure: - Patient tolerated the procedure well  Assessment/ Plan:   No follow-ups on file.  [name], MD

## 2021-11-03 NOTE — H&P (View-Only) (Signed)
11/03/2021 11:00 AM   Caylie [name] 1975/02/08 [id number]  Referring provider: [facility] Breeze, [address],  [place] [address]  Followup OAB   HPI: [name] is a 46yo here for followup for OAB. She notes 2 weeks ago she stopped feeling her interstim and she has urinary frequency and incontinence which is similar prior to interstim placement. No other complaints today   PMH: Past Medical History:  Diagnosis Date   ADHD (attention deficit hyperactivity disorder)    Allergic rhinitis    Anemia    hx of   Barrett's esophagus    Bipolar 1 disorder (HCC)    Bladder tumor    CAD (coronary artery disease)    Cancer ([place])    bladder   Constipation    Dilated cardiomyopathy ([place]) 12/20/2020   Echo 6/22 ([facility]): EF 43, mild MR, mild TR // Echo 3/22: EF 50-55   Dizziness    Full dentures    GERD (gastroesophageal reflux disease)    Gross hematuria    Hyperlipidemia    Hypertension    Stable angina    Type 2 diabetes mellitus (HCC)    Urgency of urination    dysuria, sui   Wears glasses     Surgical History: Past Surgical History:  Procedure Laterality Date   BALLOON DILATION N/A 02/17/2021   Procedure: BALLOON DILATION;  Surgeon: Eloise Harman, DO;  [facility];  Service: Endoscopy;  Laterality: N/A;   BIOPSY  02/17/2021   Procedure: BIOPSY;  Surgeon: Eloise Harman, DO;  [facility];  Service: Endoscopy;;   COLONOSCOPY WITH PROPOFOL N/A 02/17/2021   Surgeon: Eloise Harman, DO; nonbleeding internal hemorrhoids, otherwise normal exam.  Repeat in 10 years.   CYSTOSCOPY N/A 10/03/2015   Procedure: CYSTOSCOPY;  Surgeon: Irine Seal, MD;  Location: [facility];  Service: Urology;  Laterality: N/A;   CYSTOSCOPY W/ URETERAL STENT PLACEMENT Right 07/24/2021   Procedure: CYSTOSCOPY WITH RETROGRADE PYELOGRAM/URETERAL STENT PLACEMENT;  Surgeon: Cleon Gustin, MD;  Location: [facility];  Service: Urology;  Laterality:  Right;   CYSTOSCOPY WITH RETROGRADE PYELOGRAM, URETEROSCOPY AND STENT PLACEMENT Right 08/14/2021   Procedure: CYSTOSCOPY WITH RETROGRADE PYELOGRAM, URETEROSCOPY AND STENT EXCHANGE;  Surgeon: Cleon Gustin, MD;  Location: [facility];  Service: Urology;  Laterality: Right;   CYSTOSCOPY WITH STENT PLACEMENT Right 10/31/2015   Procedure: CYSTOSCOPY WITH ATTEMPTED RIGHT URETERAL OPENING;  Surgeon: Irine Seal, MD;  Location: [facility];  Service: Urology;  Laterality: Right;   ESOPHAGOGASTRODUODENOSCOPY (EGD) WITH PROPOFOL N/A 02/17/2021   Surgeon: Eloise Harman, DO; 2 cm hiatal hernia, Barrett's esophagus, gastritis with biopsies negative for H. pylori.  S/p empiric esophageal dilation.  Repeat in 3 years.   FOOT SURGERY Left    September 2022, broke 7 bones in left foot   INTERSTIM IMPLANT PLACEMENT N/A 03/14/2018   Procedure: Barrie Lyme IMPLANT FIRST STAGE;  Surgeon: Cleon Gustin, MD;  Location: [facility];  Service: Urology;  Laterality: N/A;   INTERSTIM IMPLANT PLACEMENT N/A 03/30/2018   Procedure: Barrie Lyme IMPLANT SECOND STAGE;  Surgeon: Cleon Gustin, MD;  Location: [facility];  Service: Urology;  Laterality: N/A;   LEFT HEART CATH AND CORONARY ANGIOGRAPHY N/A 05/31/2020   Procedure: LEFT HEART CATH AND CORONARY ANGIOGRAPHY;  Surgeon: Troy Sine, MD;  Location: [facility];  Service: Cardiovascular;  Laterality: N/A;   MULTIPLE TOOTH EXTRACTIONS  2015   OVARIAN CYST  REMOVAL Right 2004 approx   SHOULDER OPEN ROTATOR CUFF REPAIR Left 04/20/2019   Procedure: ROTATOR CUFF REPAIR SHOULDER OPEN;  Surgeon: Carole Civil, MD;  Location: [facility];  Service: Orthopedics;  Laterality: Left;   TONSILLECTOMY  12/30/2004   TOTAL ABDOMINAL HYSTERECTOMY W/ BILATERAL SALPINGOOPHORECTOMY  05/16/2004   TRANSURETHRAL RESECTION OF BLADDER TUMOR N/A 10/03/2015   Procedure: TRANSURETHRAL RESECTION OF BLADDER TUMOR (TURBT);  Surgeon: Irine Seal, MD;  Location: [facility];  Service: Urology;  Laterality: N/A;   TRANSURETHRAL RESECTION OF BLADDER TUMOR N/A 10/31/2015   Procedure: RE-STAGINGG TRANSURETHRAL RESECTION OF BLADDER TUMOR (TURBT);  Surgeon: Irine Seal, MD;  Location: [facility];  Service: Urology;  Laterality: N/A;   TRANSURETHRAL RESECTION OF BLADDER TUMOR N/A 08/17/2016   Procedure: TRANSURETHRAL RESECTION OF BLADDER TUMOR (TURBT);  Surgeon: Cleon Gustin, MD;  Location[facility];  Service: Urology;  Laterality: N/A;   URETERAL BIOPSY Right 08/14/2021   Procedure: URETERAL BIOPSY;  Surgeon: Cleon Gustin, MD;  Location: [facility];  Service: Urology;  Laterality: Right;    Home Medications:  Allergies as of 11/03/2021       Reactions   Ibuprofen Hives   itching itching itching   Benazepril Hives   Diphenhydramine Hives   Other reaction(s): hives Other reaction(s): hives   Latex Dermatitis   States she had redness to skin and itching    Metronidazole Nausea And Vomiting   Other reaction(s): nausea Other reaction(s): nausea   Oxycodone    Pt has tolerated hydromorphone in the past and takes Norco at home.   Toradol [ketorolac Tromethamine] Itching   Adhesive [tape] Rash   Codeine Nausea And Vomiting, Rash   Other reaction(s): hives   Loratadine Rash   Nystatin-triamcinolone Rash   Penicillins Rash   Has patient had a PCN reaction causing immediate rash, facial/tongue/throat swelling, SOB or lightheadedness with hypotension: No Has patient had a PCN reaction causing severe rash involving mucus membranes or skin necrosis: Yes Has patient had a PCN reaction that required hospitalization: No Has patient had a PCN reaction occurring within the last 10 years: no If all of the above answers are "NO", then may proceed with Cephalosporin use. Other reaction(s): nausea Received ancef w/o issue (7/23)        Medication List        Accurate as of November 03, 2021 11:00 AM. If you have any questions, ask  your nurse or doctor.          Accu-Chek Guide test strip Generic drug: glucose blood USE AS INSTRUCTED TO MONITOR GLUCOSE 4 TIMES DAILY BEFORE MEALS AND BEFORE BED.   Accu-Chek Guide w/Device Kit 1 Piece by Does not apply route as directed.   Accu-Chek Softclix Lancets lancets Use as instructed to monitor glucose 4 times daily   albuterol 108 (90 Base) MCG/ACT inhaler Commonly known as: ProAir HFA Inhale 2 puffs into the lungs every 6 (six) hours as needed for wheezing or shortness of breath.   aspirin EC 81 MG tablet Take 1 tablet (81 mg total) by mouth daily with breakfast. Swallow whole.   Blood Pressure Cuff Misc See admin instructions.   buPROPion 300 MG 24 hr tablet Commonly known as: WELLBUTRIN XL Take 300 mg by mouth every morning.   cloNIDine HCl 0.1 MG Tb12 ER tablet Commonly known as: KAPVAY 1 tablet at bedtime.   cyclobenzaprine 10 MG tablet Commonly known as: FLEXERIL Take 1 tablet (10 mg total) by mouth  3 (three) times daily as needed for muscle spasms.   Dexcom G6 Sensor Misc Apply new sensor every 10 days as directed   Dexcom G6 Transmitter Misc 1 Device by Does not apply route every 3 (three) months.   fenofibrate 145 MG tablet Commonly known as: TRICOR Take 1 tablet (145 mg total) by mouth daily.   FLUoxetine 40 MG capsule Commonly known as: PROZAC Take 40 mg by mouth daily.   gabapentin 300 MG capsule Commonly known as: NEURONTIN TAKE 1 CAPSULE(300 MG) BY MOUTH THREE TIMES DAILY What changed: See the new instructions.   HYDROcodone-acetaminophen 5-325 MG tablet Commonly known as: NORCO/VICODIN Take 1 tablet by mouth every 6 (six) hours as needed.   hydrocortisone 2.5 % rectal cream Commonly known as: ANUSOL-HC Place 1 application rectally 2 (two) times daily.   HYDROmorphone 4 MG tablet Commonly known as: DILAUDID TAKE 1 TABLET EVERY 4 HOURS AS NEEDED FOR SEVERE PAIN.   HYDROmorphone 4 MG tablet Commonly known as:  DILAUDID Take 1 tablet (4 mg total) by mouth every 4 (four) hours as needed for severe pain.   hydrOXYzine 25 MG tablet Commonly known as: ATARAX Take 1 tablet (25 mg total) by mouth 3 (three) times daily as needed for itching or anxiety.   insulin aspart 100 UNIT/ML injection Commonly known as: NovoLOG Use with Omnipod for TDD around 150 units daily   INSULIN SYRINGE 1CC/29G 29G X 1/2" 1 ML Misc Use to inject insulin into Omnipod   linaclotide 72 MCG capsule Commonly known as: Linzess Take 1 capsule (72 mcg total) by mouth daily before breakfast.   nitroGLYCERIN 0.4 MG SL tablet Commonly known as: NITROSTAT Place 1 tablet (0.4 mg total) under the tongue every 5 (five) minutes as needed for chest pain.   Omnipod DASH Pods (Gen 4) Misc Change pod every 48 hrs as directed   pantoprazole 40 MG tablet Commonly known as: PROTONIX Take 1 tablet (40 mg total) by mouth 2 (two) times daily before a meal.   Pen Needles 31G X 6 MM Misc Use to inject insulin 4 times daily   Global Ease Inject Pen Needles 31G X 5 MM Misc Generic drug: Insulin Pen Needle Inject into the skin.   promethazine 12.5 MG tablet Commonly known as: PHENERGAN Take 1 tablet (12.5 mg total) by mouth every 8 (eight) hours as needed for nausea or vomiting.   Repatha SureClick 629 MG/ML Soaj Generic drug: Evolocumab Inject 1 Dose into the skin every 14 (fourteen) days.   rosuvastatin 40 MG tablet Commonly known as: CRESTOR Take 1 tablet (40 mg total) by mouth daily.   tamsulosin 0.4 MG Caps capsule Commonly known as: FLOMAX Take 1 capsule (0.4 mg total) by mouth at bedtime.   Vyvanse 40 MG capsule Generic drug: lisdexamfetamine Take 40 mg by mouth every morning.        Allergies:  Allergies  Allergen Reactions   Ibuprofen Hives    itching itching itching   Benazepril Hives   Diphenhydramine Hives    Other reaction(s): hives Other reaction(s): hives   Latex Dermatitis    States she had  redness to skin and itching    Metronidazole Nausea And Vomiting    Other reaction(s): nausea Other reaction(s): nausea   Oxycodone     Pt has tolerated hydromorphone in the past and takes Norco at home.   Toradol [Ketorolac Tromethamine] Itching   Adhesive [Tape] Rash   Codeine Nausea And Vomiting and Rash    Other reaction(s):  hives   Loratadine Rash   Nystatin-Triamcinolone Rash   Penicillins Rash    Has patient had a PCN reaction causing immediate rash, facial/tongue/throat swelling, SOB or lightheadedness with hypotension: No Has patient had a PCN reaction causing severe rash involving mucus membranes or skin necrosis: Yes Has patient had a PCN reaction that required hospitalization: No Has patient had a PCN reaction occurring within the last 10 years: no  If all of the above answers are "NO", then may proceed with Cephalosporin use. Other reaction(s): nausea Received ancef w/o issue (7/23)    Family History: Family History  Problem Relation Age of Onset   Hypertension Mother    Cancer Mother    Breast cancer Maternal Aunt    Lung cancer Maternal Uncle    Cancer Other    Diabetes Daughter    Colon cancer Neg Hx     Social History:  reports that she quit smoking about 9 months ago. Her smoking use included cigarettes. She has a 5.00 pack-year smoking history. She has never used smokeless tobacco. She reports that she does not drink alcohol and does not use drugs.  ROS: All other review of systems were reviewed and are negative except what is noted above in HPI  Physical Exam: There were no vitals taken for this visit.  Constitutional:  Alert and oriented, No acute distress. HEENT: [place] AT, moist mucus membranes.  Trachea midline, no masses. Cardiovascular: No clubbing, cyanosis, or edema. Respiratory: Normal respiratory effort, no increased work of breathing. GI: Abdomen is soft, nontender, nondistended, no abdominal masses GU: No CVA tenderness.  Lymph: No cervical  or inguinal lymphadenopathy. Skin: No rashes, bruises or suspicious lesions. Neurologic: Grossly intact, no focal deficits, moving all 4 extremities. Psychiatric: Normal mood and affect.  Laboratory Data: Lab Results  Component Value Date   WBC 12.4 (H) 07/26/2021   HGB 11.6 (L) 07/26/2021   HCT 37.9 07/26/2021   MCV 82.8 07/26/2021   PLT 384 07/26/2021    Lab Results  Component Value Date   CREATININE 1.03 (H) 07/26/2021    No results found for: "PSA"  No results found for: "TESTOSTERONE"  Lab Results  Component Value Date   HGBA1C 7.5 (A) 10/14/2021    Urinalysis    Component Value Date/Time   COLORURINE YELLOW 07/26/2021 1739   APPEARANCEUR Cloudy (A) 09/12/2021 1131   LABSPEC 1.022 07/26/2021 1739   PHURINE 7.0 07/26/2021 1739   GLUCOSEU Negative 09/12/2021 1131   HGBUR LARGE (A) 07/26/2021 1739   BILIRUBINUR neg 11/03/2021 1019   BILIRUBINUR Negative 09/12/2021 1131   KETONESUR NEGATIVE 07/26/2021 1739   PROTEINUR Positive (A) 11/03/2021 1019   PROTEINUR 1+ (A) 09/12/2021 1131   PROTEINUR 100 (A) 07/26/2021 1739   UROBILINOGEN 0.2 11/03/2021 1019   UROBILINOGEN 0.2 03/01/2011 1920   NITRITE neg 11/03/2021 1019   NITRITE Negative 09/12/2021 1131   NITRITE NEGATIVE 07/26/2021 1739   LEUKOCYTESUR Small (1+) (A) 11/03/2021 1019   LEUKOCYTESUR Negative 09/12/2021 1131   LEUKOCYTESUR TRACE (A) 07/26/2021 1739    Lab Results  Component Value Date   LABMICR Comment 09/12/2021   WBCUA 0-5 06/18/2021   LABEPIT 0-10 06/18/2021   MUCUS Present 06/18/2021   BACTERIA NONE SEEN 07/26/2021    Pertinent Imaging:  No results found for this or any previous visit.  No results found for this or any previous visit.  No results found for this or any previous visit.  No results found for this or any previous visit.  Results for orders placed during the hospital encounter of 09/04/21  US RENAL  Narrative CLINICAL DATA:  Follow-up known right renal  calcification.  EXAM: RENAL / URINARY TRACT ULTRASOUND COMPLETE  COMPARISON:  July 21, 2021 renal ultrasound  FINDINGS: Right Kidney:  Renal measurements: 10.4 x 4.3 x 5.7 cm = volume: 133 mL. Echogenicity within normal limits. No mass or hydronephrosis visualized.  Left Kidney:  Renal measurements: 11.9 x 5.7 x 4.8 cm = volume: 172 mL. Echogenicity within normal limits. No mass or hydronephrosis visualized.  Bladder:  Appears normal for degree of bladder distention.  Other:  None.  IMPRESSION: No renal stones or obstruction identified. The bladder is unremarkable.   Electronically Signed By: Dorise Bullion III M.D. On: 09/04/2021 17:00  No valid procedures specified. No results found for this or any previous visit.  Results for orders placed in visit on 08/25/21  CT RENAL STONE STUDY  Narrative CLINICAL DATA:  Right-sided flank pain hematuria for 6 weeks.  EXAM: CT ABDOMEN AND PELVIS WITHOUT CONTRAST  TECHNIQUE: Multidetector CT imaging of the abdomen and pelvis was performed following the standard protocol without IV contrast.  RADIATION DOSE REDUCTION: This exam was performed according to the departmental dose-optimization program which includes automated exposure control, adjustment of the mA and/or kV according to patient size and/or use of iterative reconstruction technique.  COMPARISON:  Prior CT scans 07/26/2021 and 07/18/2021  FINDINGS: Lower chest: The lung bases are clear of acute process. No pleural effusion or pulmonary lesions. The heart is normal in size. No pericardial effusion. The distal esophagus and aorta are unremarkable.  Hepatobiliary: No focal hepatic lesions or intrahepatic biliary dilatation. The gallbladder is normal. No common bile duct dilatation.  Pancreas: No mass, inflammation or ductal dilatation.  Spleen: Normal size. No focal lesions.  Adrenals/Urinary Tract: Adrenal glands are normal.  The right-sided  double-J ureteral stent is been removed. There is very mild right-sided hydroureter but no obstructing ureteral calculi.  Stomach/Bowel: The stomach, duodenum, small bowel and colon are grossly normal without oral contrast. No inflammatory changes, mass lesions or obstructive findings.  Vascular/Lymphatic: Age advanced atherosclerotic calcifications involving the aorta and iliac arteries. No mesenteric or retroperitoneal mass or adenopathy.  Reproductive: The uterus and ovaries are is surgically absent.  Other: Stable small periumbilical abdominal hernia containing fat. AP left-sided sacral nerve root stimulator is noted.  Musculoskeletal: No significant bony findings.  IMPRESSION: 1. Interval removal of the right-sided double-J ureteral stent with very mild right-sided hydroureter but no obstructing ureteral calculi. 2. No acute abdominal/pelvic findings, mass lesions or adenopathy. 3. Age advanced atherosclerotic calcifications involving the aorta and iliac arteries.  Aortic Atherosclerosis (ICD10-I70.0).   Electronically Signed By: Marijo Sanes M.D. On: 09/04/2021 11:39   Assessment & Plan:    1. OAB with malfunctioning interstim We will schedule for generator replacement. Risks/benefits/alternatives discussed - POCT urinalysis dipstick - ciprofloxacin (CIPRO) tablet 500 mg   No follow-ups on file.  [name], MD  [facility]         11/03/21   There were no vitals taken for this visit. NED. A&Ox3.   No respiratory distress   Abd soft, NT, ND Normal external genitalia with patent urethral meatus  Cystoscopy Procedure Note  Patient identification was confirmed, informed consent was obtained, and patient was prepped using Betadine solution.  Lidocaine jelly was administered per urethral meatus.    Procedure: - Flexible cystoscope introduced, without any difficulty.   - Thorough search of the  bladder revealed:    normal  urethral meatus    normal urothelium    no stones    no ulcers     no tumors    no urethral polyps    no trabeculation  - Ureteral orifices were normal in position and appearance.  Post-Procedure: - Patient tolerated the procedure well  Assessment/ Plan:   No follow-ups on file.  [name], MD

## 2021-11-10 ENCOUNTER — Encounter: Payer: Self-pay | Admitting: Urology

## 2021-11-10 NOTE — Patient Instructions (Signed)
Sacral Nerve Stimulator Implantation Sacral nerve stimulator implantation is a procedure to place a device under the skin. The device is used to treat disorders that make it hard to control urine and bowel movements. The device generates mild electrical impulses. It is placed permanently in the area of the upper buttocks. Wires (electrodes) are also inserted into the body so that electrical impulses generated by the device can be sent to the sacral nerves. The sacral nerves control several functions in the lower part of the body, including the passing of urine and stool. Before having sacral nerve stimulator implantation, you may undergo a trial test called a percutaneous nerve evaluation. This trial, which usually lasts 1-2 weeks, helps to determine if sacral nerve stimulation will help your condition. Sacral nerve stimulator implantation is a two-stage surgery. Stage 1 of this surgery involves implanting an electrode into your lower back, near your sacral nerve. A wire (lead) is attached to the electrode and run under the skin to exit through your back. The sacral nerve stimulator lead is attached to a handheld device. This trial helps determine if sacral nerve stimulation will help your condition. If your bladder symptoms improve by at least 50 percent during the trial phase, you may have the second stage. After the device is fully implanted in stage 2, it can be used 24 hours a day. The stimulator will be programmed for you. Your health care provider will set how strong the pulses will be (intensity) and how often they occur (frequency). Tell a health care provider about: Any allergies you have. All medicines you are taking, including vitamins, herbs, eye drops, creams, and over-the-counter medicines. Any problems you or family members have had with anesthetic medicines. Any blood disorders you have. Any surgeries you have had. Any medical conditions you have or have had. Whether you are pregnant or  may be pregnant. What are the risks? Generally, this is a safe procedure. However, problems may occur, including: Infection. Bleeding. Uncomfortable sensations, such as a jolting or shocking feeling. Movement of the electrode away from the place where it was inserted (migration). Failure of the stimulator. Damage to nearby structures or organs, such as nerves near the spine. Allergic reactions to medicines or the device. What happens before the procedure? Staying hydrated Follow instructions from your health care provider about hydration, which may include: Up to 2 hours before the procedure - you may continue to drink clear liquids, such as water, clear fruit juice, black coffee, and plain tea.  Eating and drinking restrictions Follow instructions from your health care provider about eating and drinking, which may include: 8 hours before the procedure - stop eating heavy meals or foods, such as meat, fried foods, or fatty foods. 6 hours before the procedure - stop eating light meals or foods, such as toast or cereal. 6 hours before the procedure - stop drinking milk or drinks that contain milk. 2 hours before the procedure - stop drinking clear liquids. Medicines Ask your health care provider about: Changing or stopping your regular medicines. This is especially important if you are taking diabetes medicines or blood thinners. Taking medicines such as aspirin and ibuprofen. These medicines can thin your blood. Do not take these medicines unless your health care provider tells you to take them. Taking over-the-counter medicines, vitamins, herbs, and supplements. General instructions Plan to have a responsible adult take you home from the hospital or clinic. If you will be going home right after the procedure, plan to have a responsible   adult care for you for the time you are told. This is important. Ask your health care provider what steps will be taken to help prevent infection. These  steps may include: Removing hair at the surgery site. Washing skin with a germ-killing soap. Taking antibiotic medicine. What happens during the procedure?  An IV will be inserted into one of your veins. You will be given one or more of the following: A medicine to help you relax (sedative). A medicine to numb the area (local anesthetic). A medicine to make you fall asleep (general anesthetic). Long needles will be inserted into your lower back. The needles will be guided to the place where the nerves exit the backbone. The position of the needles will be tested. If they are in the right spot, your toes or feet may move. If you are awake, you may feel a tingling in your legs. The electrodes will be inserted through the needles and into your body. The electrodes will be anchored in place close to your sacral nerves. Small incisions will be made under the skin in your upper buttocks. The sacral nerve stimulator device will be placed in this area. The ends of the electrodes that attach to the stimulator will be guided under your skin. They will extend from your sacral nerves, where they are anchored, to the stimulator device. They will then be attached to the device. Your health care provider will program the rate at which the nerve stimulator will deliver the electronic pulses. The incisions will be closed with stitches (sutures) or staples. A bandage (dressing) will be placed over the incision area. The procedure may vary among health care providers and hospitals. What happens after the procedure? Your blood pressure, heart rate, breathing rate, and blood oxygen level will be monitored until you leave the hospital or clinic. You may be given pain and antibiotic medicine as needed. You will be taught how to use and care for the device. If you were given a sedative during the procedure, it can affect you for several hours. Do not drive or operate machinery until your health care provider says  that it is safe. Summary Sacral nerve stimulator implantation is a procedure to place a device under your skin. The device will treat disorders that make it hard to control urine and bowel movements. The sacral nerves control several functions in the lower part of the body, including bladder and bowel functions. If you will be going home right after the procedure, plan to have a responsible adult care for you for the time you are told. You will be taught how to use and care for the device. If you were given a sedative during the procedure, do not drive until your health care provider approves. This information is not intended to replace advice given to you by your health care provider. Make sure you discuss any questions you have with your health care provider. Document Revised: 08/04/2019 Document Reviewed: 08/04/2019 Elsevier Patient Education  2023 [employer]  

## 2021-11-24 ENCOUNTER — Emergency Department (HOSPITAL_COMMUNITY)
Admission: EM | Admit: 2021-11-24 | Discharge: 2021-11-24 | Disposition: A | Discharge status: Home / Self Care | Payer: Medicaid Other | Attending: Emergency Medicine | Admitting: Emergency Medicine

## 2021-11-24 ENCOUNTER — Emergency Department (HOSPITAL_COMMUNITY): Payer: Medicaid Other

## 2021-11-24 ENCOUNTER — Other Ambulatory Visit: Payer: Self-pay

## 2021-11-24 ENCOUNTER — Encounter (HOSPITAL_COMMUNITY): Payer: Self-pay

## 2021-11-24 DIAGNOSIS — R059 Cough, unspecified: Secondary | ICD-10-CM | POA: Diagnosis present

## 2021-11-24 DIAGNOSIS — Z7982 Long term (current) use of aspirin: Secondary | ICD-10-CM | POA: Insufficient documentation

## 2021-11-24 DIAGNOSIS — Z91040 Latex allergy status: Secondary | ICD-10-CM | POA: Insufficient documentation

## 2021-11-24 DIAGNOSIS — J069 Acute upper respiratory infection, unspecified: Secondary | ICD-10-CM | POA: Diagnosis not present

## 2021-11-24 MED ORDER — BENZONATATE 100 MG PO CAPS: 100.0000 mg | ORAL_CAPSULE | Freq: Three times a day (TID) | ORAL | 0 refills | Status: DC

## 2021-11-24 MED ORDER — HYDROCODONE-ACETAMINOPHEN 5-325 MG PO TABS
1.0000 | ORAL_TABLET | Freq: Once | ORAL | Status: AC
  Administered 2021-11-24: 1 via ORAL
  Filled 2021-11-24: qty 1

## 2021-11-24 NOTE — Discharge Instructions (Addendum)
It was a pleasure taking care of you today!   Your chest x-ray did not show any concerning findings today.  Your EKG was unremarkable.  You will be sent a prescription for [facility], take as directed.  You may continue with over-the-counter cough and cold medications.  Ensure to maintain fluid intake with hot tea and honey.  Follow-up with your primary care provider as needed.  Return to the emergency department for experiencing increasing/worsening symptoms.

## 2021-11-24 NOTE — ED Provider Notes (Signed)
[facility] EMERGENCY DEPARTMENT Provider Note   CSN: [id number] Arrival date & time: 11/24/21  1556     History  Chief Complaint  Patient presents with   Cough    Sandra [name] is a 46 y.o. female who presents emergency department with concerns for productive cough onset 1 week.  Notes that she was evaluated by her primary care provider and started on antibiotics which she completed.  Had a negative COVID, flu, RSV test last week.  Has associated chest wall pain associated with coughing, rhinorrhea, nasal congestion.  Denies fever, nausea, vomiting.  Patient notes increased anxiety in the current room that she is in due to of family member being in this room prior to being admitted and unfortunately succumbing to their illnesses.  The history is provided by the patient. No language interpreter was used.       Home Medications Prior to Admission medications   Medication Sig Start Date End Date Taking? Authorizing Provider  aspirin EC 81 MG tablet Take 1 tablet (81 mg total) by mouth daily with breakfast. Swallow whole. 07/24/21  Yes Emokpae, Courage, MD  azithromycin (ZITHROMAX) 250 MG tablet Take 250 mg by mouth as directed. 11/21/21  Yes [provider]  benzonatate (TESSALON) 100 MG capsule Take 1 capsule (100 mg total) by mouth every 8 (eight) hours. 11/24/21  Yes Gracelynn Bircher A, PA-C  buPROPion (WELLBUTRIN XL) 300 MG 24 hr tablet Take 300 mg by mouth every morning. 01/18/20  Yes [provider]  cloNIDine HCl (KAPVAY) 0.1 MG TB12 ER tablet 1 tablet at bedtime.   Yes [provider]  Continuous Blood Gluc Sensor (DEXCOM G6 SENSOR) MISC Apply new sensor every 10 days as directed 10/14/21  Yes Reardon, Juanetta Beets, NP  Evolocumab (REPATHA SURECLICK) 712 MG/ML SOAJ Inject 1 Dose into the skin every 14 (fourteen) days. 06/24/21  Yes Hilty, Nadean Corwin, MD  fenofibrate (TRICOR) 145 MG tablet Take 1 tablet (145 mg total) by mouth daily. 06/24/21  Yes Hilty, Nadean Corwin, MD  FLUoxetine (PROZAC) 40 MG capsule Take 40 mg by mouth daily. 04/02/20  Yes [provider]  gabapentin (NEURONTIN) 300 MG capsule TAKE 1 CAPSULE(300 MG) BY MOUTH THREE TIMES DAILY Patient taking differently: Take 300 mg by mouth 3 (three) times daily. 08/08/19  Yes Edrick Kins, DPM  hydrOXYzine (ATARAX) 25 MG tablet Take 1 tablet (25 mg total) by mouth 3 (three) times daily as needed for itching or anxiety. 07/24/21  Yes Emokpae, Courage, MD  insulin aspart (NOVOLOG) 100 UNIT/ML injection Use with Omnipod for TDD around 150 units daily 10/14/21  Yes Reardon, Juanetta Beets, NP  Insulin Disposable Pump (OMNIPOD DASH PODS, GEN 4,) MISC Change pod every 48 hrs as directed 10/30/21  Yes Brita Romp, NP  linaclotide (LINZESS) 72 MCG capsule Take 1 capsule (72 mcg total) by mouth daily before breakfast. 06/18/21  Yes Erenest Rasher, PA-C  nitroGLYCERIN (NITROSTAT) 0.4 MG SL tablet Place 1 tablet (0.4 mg total) under the tongue every 5 (five) minutes as needed for chest pain. 12/20/20 11/24/21 Yes Weaver, Scott T, PA-C  pantoprazole (PROTONIX) 40 MG tablet Take 1 tablet (40 mg total) by mouth 2 (two) times daily before a meal. 06/18/21  Yes Jodi Mourning, Kristen S, PA-C  rosuvastatin (CRESTOR) 40 MG tablet Take 1 tablet (40 mg total) by mouth daily. 06/24/21  Yes Hilty, Nadean Corwin, MD  tamsulosin (FLOMAX) 0.4 MG CAPS capsule Take 1 capsule (0.4 mg total) by mouth at  bedtime. 07/24/21  Yes Emokpae, Courage, MD  VYVANSE 50 MG capsule Take 50 mg by mouth daily. 11/03/21  Yes [provider]  ACCU-CHEK GUIDE test strip USE AS INSTRUCTED TO MONITOR GLUCOSE 4 TIMES DAILY BEFORE MEALS AND BEFORE BED. 04/29/21   Brita Romp, NP  Accu-Chek Softclix Lancets lancets Use as instructed to monitor glucose 4 times daily 07/10/20   Brita Romp, NP  albuterol (PROAIR HFA) 108 (90 Base) MCG/ACT inhaler Inhale 2 puffs into the lungs every 6 (six) hours as needed for wheezing or shortness of  breath. Patient not taking: Reported on 11/24/2021 07/24/21   Roxan Hockey, MD  Blood Glucose Monitoring Suppl (ACCU-CHEK GUIDE) w/Device KIT 1 Piece by Does not apply route as directed. 04/15/20   Brita Romp, NP  Blood Pressure Monitoring (BLOOD PRESSURE CUFF) MISC See admin instructions.    [provider]  Continuous Blood Gluc Transmit (DEXCOM G6 TRANSMITTER) MISC 1 Device by Does not apply route every 3 (three) months. 10/14/21   Brita Romp, NP  cyclobenzaprine (FLEXERIL) 10 MG tablet Take 1 tablet (10 mg total) by mouth 3 (three) times daily as needed for muscle spasms. Patient not taking: Reported on 11/24/2021 09/12/21   Cleon Gustin, MD  FLUoxetine (PROZAC) 20 MG capsule Take 20 mg by mouth daily. Patient not taking: Reported on 11/24/2021 10/24/21   [provider]  GLOBAL EASE INJECT PEN NEEDLES 31G X 5 MM MISC Inject into the skin. 03/13/21   [provider]  HYDROcodone-acetaminophen (NORCO/VICODIN) 5-325 MG tablet Take 1 tablet by mouth every 6 (six) hours as needed. Patient not taking: Reported on 11/24/2021 07/24/21   Roxan Hockey, MD  Insulin Pen Needle (PEN NEEDLES) 31G X 6 MM MISC Use to inject insulin 4 times daily 02/24/21   Brita Romp, NP  INSULIN SYRINGE 1CC/29G 29G X 1/2" 1 ML MISC Use to inject insulin into Omnipod 08/05/21   Brita Romp, NP  ondansetron (ZOFRAN-ODT) 4 MG disintegrating tablet Take 4 mg by mouth every 8 (eight) hours as needed. Patient not taking: Reported on 11/24/2021 10/14/21   [provider]  promethazine (PHENERGAN) 12.5 MG tablet Take 1 tablet (12.5 mg total) by mouth every 8 (eight) hours as needed for nausea or vomiting. Patient not taking: Reported on 11/24/2021 10/14/21   Brita Romp, NP      Allergies    Ibuprofen, Benazepril, Diphenhydramine, Latex, Metronidazole, Oxycodone, Toradol [ketorolac tromethamine], Adhesive [tape], Codeine, Loratadine,  Nystatin-triamcinolone, and Penicillins    Review of Systems   Review of Systems  Respiratory:  Positive for cough.   All other systems reviewed and are negative.   Physical Exam Updated Vital Signs BP 130/74   Pulse 85   Temp 98.3 F (36.8 C) (Oral)   Resp 10   Ht 5' (1.524 m)   Wt 81.2 kg   SpO2 96%   BMI 34.96 kg/m  Physical Exam Vitals and nursing note reviewed.  Constitutional:      General: She is not in acute distress.    Appearance: Normal appearance.  Eyes:     General: No scleral icterus.    Extraocular Movements: Extraocular movements intact.  Cardiovascular:     Rate and Rhythm: Normal rate.  Pulmonary:     Effort: Pulmonary effort is normal. No respiratory distress.  Abdominal:     Palpations: Abdomen is soft. There is no mass.     Tenderness: There is no abdominal tenderness.  Musculoskeletal:  General: Normal range of motion.     Cervical back: Neck supple.  Skin:    General: Skin is warm and dry.     Findings: No rash.  Neurological:     Mental Status: She is alert.     Sensory: Sensation is intact.     Motor: Motor function is intact.  Psychiatric:        Behavior: Behavior normal.     ED Results / Procedures / Treatments   Labs (all labs ordered are listed, but only abnormal results are displayed) Labs Reviewed - No data to display  EKG EKG Interpretation  Date/Time:  Monday November 24 2021 16:23:21 EST Ventricular Rate:  82 PR Interval:  148 QRS Duration: 104 QT Interval:  396 QTC Calculation: 462 R Axis:   95 Text Interpretation: Normal sinus rhythm Rightward axis Borderline ECG When compared with ECG of 19-Jul-2021 13:47, Non-specific change in ST segment in Inferior leads T wave inversion no longer evident in Anterolateral leads QT has shortened Confirmed by Fredia Sorrow [phone]) on 11/24/2021 6:01:31 PM  Radiology DG Chest 2 View  Result Date: 11/24/2021 CLINICAL DATA:  One-week history of cough EXAM: CHEST - 2  VIEW COMPARISON:  Chest radiograph dated 07/26/2021 FINDINGS: Mildly hyperinflated lungs. No focal consolidations. No pleural effusion or pneumothorax. The heart size and mediastinal contours are within normal limits. Left humeral anchor. IMPRESSION: No acute cardiopulmonary disease. Electronically Signed   By: Darrin Nipper M.D.   On: 11/24/2021 16:39    Procedures Procedures    Medications Ordered in ED Medications  HYDROcodone-acetaminophen (NORCO/VICODIN) 5-325 MG per tablet 1 tablet (1 tablet Oral Given 11/24/21 1827)    ED Course/ Medical Decision Making/ A&P                            Medical Decision Making Amount and/or Complexity of Data Reviewed Radiology: ordered.  Risk Prescription drug management.   Pt presents with productive cough onset 1 week.  Recently finished antibiotic.  Patient afebrile.  On exam patient without acute cardiovascular, respiratory exam findings.  Differential diagnosis includes COVID, flu, viral URI with cough, pneumonia.   Imaging: I ordered imaging studies including Chest x-ray I independently visualized and interpreted imaging which showed: No acute findings I agree with the radiologist interpretation  Medications:  I ordered medication including Norco for pain management Reevaluation of the patient after these medicines and interventions, I reevaluated the patient and found that they have improved I have reviewed the patients home medicines and have made adjustments as needed   Disposition: Presenting suspicious for viral URI with cough.  Doubt at this time, pneumonia, COVID, flu. After consideration of the diagnostic results and the patients response to treatment, I feel that the patient would benefit from Discharge home.  Patient provided with a prescription for [facility].  Supportive care measures and strict return precautions discussed with patient at bedside. Pt acknowledges and verbalizes understanding. Pt appears safe for  discharge. Follow up as indicated in discharge paperwork.    This chart was dictated using voice recognition software, Dragon. Despite the best efforts of this provider to proofread and correct errors, errors may still occur which can change documentation meaning.   Final Clinical Impression(s) / ED Diagnoses Final diagnoses:  Viral URI with cough    Rx / DC Orders ED Discharge Orders          Ordered    benzonatate (TESSALON) 100 MG capsule  Every 8 hours        11/24/21 1813              Blue, Soijett A, PA-C 11/24/21 1836    Fredia Sorrow, MD 11/26/21 [phone]

## 2021-11-24 NOTE — ED Triage Notes (Signed)
Pt presents to ED with productive cough x 1 week. Pt denies fever, states hurts in her chest from coughing.

## 2021-11-25 NOTE — Patient Instructions (Signed)
MYLENE BOW  11/25/2021     '@PREFPERIOPPHARMACY'$ @   Your procedure is scheduled on  12/01/2021.   Report to [facility] at  0600 A.M.   Call this number if you have problems the morning of surgery:  [phone]  If you experience any cold or flu symptoms such as cough, fever, chills, shortness of breath, etc. between now and your scheduled surgery, please notify us at the above number.   Remember:  Do not eat or drink after midnight.       Take 1/2 of your night time insulin dose the night bfore your procedure.     DO NOT take any medications for diabetes the morning of your procedure.     Take these medicines the morning of surgery with A SIP OF WATER                      wellbutrin, prozac, gabapentin, hydroxyzine, protonix, vyvanse.     Do not wear jewelry, make-up or nail polish.  Do not wear lotions, powders, or perfumes, or deodorant.  Do not shave 48 hours prior to surgery.  Men may shave face and neck.  Do not bring valuables to the hospital.  [facility] is not responsible for any belongings or valuables.  Contacts, dentures or bridgework may not be worn into surgery.  Leave your suitcase in the car.  After surgery it may be brought to your room.  For patients admitted to the hospital, discharge time will be determined by your treatment team.  Patients discharged the day of surgery will not be allowed to drive home and must have someone with them for 24 hours.    Special instructions:   DO NOT smoke tobacco or vape for 24 hours before your procedure.  Please read over the following fact sheets that you were given. Coughing and Deep Breathing, Surgical Site Infection Prevention, Anesthesia Post-op Instructions, and Care and Recovery After Surgery      General Anesthesia, Adult, Care After The following information offers guidance on how to care for yourself after your procedure. Your health care provider may also give you more specific  instructions. If you have problems or questions, contact your health care provider. What can I expect after the procedure? After the procedure, it is common for people to: Have pain or discomfort at the IV site. Have nausea or vomiting. Have a sore throat or hoarseness. Have trouble concentrating. Feel cold or chills. Feel weak, sleepy, or tired (fatigue). Have soreness and body aches. These can affect parts of the body that were not involved in surgery. Follow these instructions at home: For the time period you were told by your health care provider:  Rest. Do not participate in activities where you could fall or become injured. Do not drive or use machinery. Do not drink alcohol. Do not take sleeping pills or medicines that cause drowsiness. Do not make important decisions or sign legal documents. Do not take care of children on your own. General instructions Drink enough fluid to keep your urine pale yellow. If you have sleep apnea, surgery and certain medicines can increase your risk for breathing problems. Follow instructions from your health care provider about wearing your sleep device: Anytime you are sleeping, including during daytime naps. While taking prescription pain medicines, sleeping medicines, or medicines that make you drowsy. Return to your normal activities as told by your health care provider. Ask your health care provider what activities  are safe for you. Take over-the-counter and prescription medicines only as told by your health care provider. Do not use any products that contain nicotine or tobacco. These products include cigarettes, chewing tobacco, and vaping devices, such as e-cigarettes. These can delay incision healing after surgery. If you need help quitting, ask your health care provider. Contact a health care provider if: You have nausea or vomiting that does not get better with medicine. You vomit every time you eat or drink. You have pain that does  not get better with medicine. You cannot urinate or have bloody urine. You develop a skin rash. You have a fever. Get help right away if: You have trouble breathing. You have chest pain. You vomit blood. These symptoms may be an emergency. Get help right away. Call 911. Do not wait to see if the symptoms will go away. Do not drive yourself to the hospital. Summary After the procedure, it is common to have a sore throat, hoarseness, nausea, vomiting, or to feel weak, sleepy, or fatigue. For the time period you were told by your health care provider, do not drive or use machinery. Get help right away if you have difficulty breathing, have chest pain, or vomit blood. These symptoms may be an emergency. This information is not intended to replace advice given to you by your health care provider. Make sure you discuss any questions you have with your health care provider. Document Revised: 03/28/2021 Document Reviewed: 03/28/2021 Elsevier Patient Education  [employer].

## 2021-11-27 ENCOUNTER — Encounter (HOSPITAL_COMMUNITY): Payer: Self-pay

## 2021-11-27 ENCOUNTER — Encounter (HOSPITAL_COMMUNITY)
Admission: RE | Admit: 2021-11-27 | Discharge: 2021-11-27 | Disposition: A | Discharge status: Home / Self Care | Payer: Medicaid Other | Source: Ambulatory Visit | Attending: Urology | Admitting: Urology

## 2021-11-27 DIAGNOSIS — Z794 Long term (current) use of insulin: Secondary | ICD-10-CM | POA: Diagnosis not present

## 2021-11-27 DIAGNOSIS — E1165 Type 2 diabetes mellitus with hyperglycemia: Secondary | ICD-10-CM | POA: Insufficient documentation

## 2021-11-27 DIAGNOSIS — Z01818 Encounter for other preprocedural examination: Secondary | ICD-10-CM | POA: Diagnosis present

## 2021-11-27 DIAGNOSIS — D62 Acute posthemorrhagic anemia: Secondary | ICD-10-CM | POA: Insufficient documentation

## 2021-11-27 LAB — CBC WITH DIFFERENTIAL/PLATELET
Abs Immature Granulocytes: 0.04 10*3/uL (ref 0.00–0.07)
Basophils Absolute: 0 10*3/uL (ref 0.0–0.1)
Basophils Relative: 0 %
Eosinophils Absolute: 0.2 10*3/uL (ref 0.0–0.5)
Eosinophils Relative: 2 %
HCT: 41.4 % (ref 36.0–46.0)
Hemoglobin: 12.8 g/dL (ref 12.0–15.0)
Immature Granulocytes: 0 %
Lymphocytes Relative: 33 %
Lymphs Abs: 3.1 10*3/uL (ref 0.7–4.0)
MCH: 24.8 pg — ABNORMAL LOW (ref 26.0–34.0)
MCHC: 30.9 g/dL (ref 30.0–36.0)
MCV: 80.2 fL (ref 80.0–100.0)
Monocytes Absolute: 0.6 10*3/uL (ref 0.1–1.0)
Monocytes Relative: 7 %
Neutro Abs: 5.2 10*3/uL (ref 1.7–7.7)
Neutrophils Relative %: 58 %
Platelets: 452 10*3/uL — ABNORMAL HIGH (ref 150–400)
RBC: 5.16 MIL/uL — ABNORMAL HIGH (ref 3.87–5.11)
RDW: 14.6 % (ref 11.5–15.5)
WBC: 9.2 10*3/uL (ref 4.0–10.5)
nRBC: 0 % (ref 0.0–0.2)

## 2021-11-27 LAB — BASIC METABOLIC PANEL
Anion gap: 10 (ref 5–15)
BUN: 17 mg/dL (ref 6–20)
CO2: 22 mmol/L (ref 22–32)
Calcium: 9.3 mg/dL (ref 8.9–10.3)
Chloride: 105 mmol/L (ref 98–111)
Creatinine, Ser: 0.89 mg/dL (ref 0.44–1.00)
GFR, Estimated: >60 mL/min (ref >60)
Glucose, Bld: 125 mg/dL — ABNORMAL HIGH (ref 70–99)
Potassium: 3.9 mmol/L (ref 3.5–5.1)
Sodium: 137 mmol/L (ref 135–145)

## 2021-12-01 ENCOUNTER — Ambulatory Visit (HOSPITAL_COMMUNITY): Payer: Medicaid Other | Admitting: Anesthesiology

## 2021-12-01 ENCOUNTER — Encounter (HOSPITAL_COMMUNITY)
Admission: RE | Disposition: A | Discharge status: Home / Self Care | Payer: Self-pay | Source: Home / Self Care | Attending: Urology

## 2021-12-01 ENCOUNTER — Ambulatory Visit (HOSPITAL_BASED_OUTPATIENT_CLINIC_OR_DEPARTMENT_OTHER): Payer: Medicaid Other | Admitting: Anesthesiology

## 2021-12-01 ENCOUNTER — Encounter (HOSPITAL_COMMUNITY): Payer: Self-pay | Admitting: Urology

## 2021-12-01 ENCOUNTER — Ambulatory Visit (HOSPITAL_COMMUNITY): Payer: Medicaid Other

## 2021-12-01 ENCOUNTER — Ambulatory Visit (HOSPITAL_COMMUNITY)
Admission: RE | Admit: 2021-12-01 | Discharge: 2021-12-01 | Disposition: A | Discharge status: Home / Self Care | Payer: Medicaid Other | Attending: Urology | Admitting: Urology

## 2021-12-01 ENCOUNTER — Other Ambulatory Visit: Payer: Self-pay

## 2021-12-01 DIAGNOSIS — I2510 Atherosclerotic heart disease of native coronary artery without angina pectoris: Secondary | ICD-10-CM | POA: Diagnosis not present

## 2021-12-01 DIAGNOSIS — T8389XA Other specified complication of genitourinary prosthetic devices, implants and grafts, initial encounter: Secondary | ICD-10-CM | POA: Diagnosis not present

## 2021-12-01 DIAGNOSIS — Z87891 Personal history of nicotine dependence: Secondary | ICD-10-CM | POA: Diagnosis not present

## 2021-12-01 DIAGNOSIS — N3281 Overactive bladder: Secondary | ICD-10-CM | POA: Insufficient documentation

## 2021-12-01 DIAGNOSIS — I10 Essential (primary) hypertension: Secondary | ICD-10-CM | POA: Insufficient documentation

## 2021-12-01 DIAGNOSIS — F418 Other specified anxiety disorders: Secondary | ICD-10-CM

## 2021-12-01 DIAGNOSIS — E119 Type 2 diabetes mellitus without complications: Secondary | ICD-10-CM | POA: Diagnosis not present

## 2021-12-01 DIAGNOSIS — F319 Bipolar disorder, unspecified: Secondary | ICD-10-CM | POA: Diagnosis not present

## 2021-12-01 DIAGNOSIS — K219 Gastro-esophageal reflux disease without esophagitis: Secondary | ICD-10-CM | POA: Diagnosis not present

## 2021-12-01 DIAGNOSIS — D649 Anemia, unspecified: Secondary | ICD-10-CM

## 2021-12-01 DIAGNOSIS — T85193A Other mechanical complication of implanted electronic neurostimulator, generator, initial encounter: Secondary | ICD-10-CM

## 2021-12-01 HISTORY — PX: INTERSTIM IMPLANT REVISION: SHX5138

## 2021-12-01 LAB — GLUCOSE, CAPILLARY
Glucose-Capillary: 107 mg/dL — ABNORMAL HIGH (ref 70–99)
Glucose-Capillary: 127 mg/dL — ABNORMAL HIGH (ref 70–99)

## 2021-12-01 SURGERY — REVISION, SACRAL NERVE STIMULATOR, INTERSTIM
Anesthesia: General

## 2021-12-01 MED ORDER — SUCCINYLCHOLINE CHLORIDE 200 MG/10ML IV SOSY
PREFILLED_SYRINGE | INTRAVENOUS | Status: DC | PRN
  Administered 2021-12-01: 100 mg via INTRAVENOUS

## 2021-12-01 MED ORDER — ONDANSETRON HCL 4 MG/2ML IJ SOLN
INTRAMUSCULAR | Status: AC
  Filled 2021-12-01: qty 2

## 2021-12-01 MED ORDER — LACTATED RINGERS IV SOLN: INTRAVENOUS | Status: DC | PRN

## 2021-12-01 MED ORDER — PROPOFOL 10 MG/ML IV BOLUS
INTRAVENOUS | Status: DC | PRN
  Administered 2021-12-01: 50 mg via INTRAVENOUS
  Administered 2021-12-01: 20 mg via INTRAVENOUS
  Administered 2021-12-01: 150 mg via INTRAVENOUS
  Administered 2021-12-01: 20 mg via INTRAVENOUS

## 2021-12-01 MED ORDER — HYDROCODONE-ACETAMINOPHEN 7.5-325 MG PO TABS
1.0000 | ORAL_TABLET | Freq: Once | ORAL | Status: AC | PRN
  Administered 2021-12-01: 1 via ORAL
  Filled 2021-12-01: qty 1

## 2021-12-01 MED ORDER — WATER FOR IRRIGATION, STERILE IR SOLN
Status: DC | PRN
  Administered 2021-12-01: 500 mL

## 2021-12-01 MED ORDER — ROCURONIUM BROMIDE 10 MG/ML (PF) SYRINGE
PREFILLED_SYRINGE | INTRAVENOUS | Status: AC
  Filled 2021-12-01: qty 20

## 2021-12-01 MED ORDER — LIDOCAINE HCL (CARDIAC) PF 100 MG/5ML IV SOSY
PREFILLED_SYRINGE | INTRAVENOUS | Status: DC | PRN
  Administered 2021-12-01: 50 mg via INTRAVENOUS

## 2021-12-01 MED ORDER — FENTANYL CITRATE (PF) 100 MCG/2ML IJ SOLN
INTRAMUSCULAR | Status: DC | PRN
  Administered 2021-12-01 (×2): 50 ug via INTRAVENOUS

## 2021-12-01 MED ORDER — LIDOCAINE HCL (PF) 2 % IJ SOLN
INTRAMUSCULAR | Status: AC
  Filled 2021-12-01: qty 5

## 2021-12-01 MED ORDER — PROMETHAZINE HCL 25 MG/ML IJ SOLN
6.2500 mg | INTRAMUSCULAR | Status: DC | PRN
  Administered 2021-12-01: 6.25 mg via INTRAVENOUS
  Filled 2021-12-01: qty 1

## 2021-12-01 MED ORDER — EPHEDRINE SULFATE-NACL 50-0.9 MG/10ML-% IV SOSY
PREFILLED_SYRINGE | INTRAVENOUS | Status: DC | PRN
  Administered 2021-12-01 (×2): 10 mg via INTRAVENOUS
  Administered 2021-12-01: 5 mg via INTRAVENOUS

## 2021-12-01 MED ORDER — PROMETHAZINE HCL 12.5 MG PO TABS: 12.5000 mg | ORAL_TABLET | Freq: Four times a day (QID) | ORAL | 0 refills | Status: DC | PRN

## 2021-12-01 MED ORDER — MIDAZOLAM HCL 2 MG/2ML IJ SOLN
INTRAMUSCULAR | Status: AC
  Filled 2021-12-01: qty 2

## 2021-12-01 MED ORDER — SULFAMETHOXAZOLE-TRIMETHOPRIM 800-160 MG PO TABS: 1.0000 | ORAL_TABLET | Freq: Two times a day (BID) | ORAL | 0 refills | Status: DC

## 2021-12-01 MED ORDER — PHENYLEPHRINE 80 MCG/ML (10ML) SYRINGE FOR IV PUSH (FOR BLOOD PRESSURE SUPPORT)
PREFILLED_SYRINGE | INTRAVENOUS | Status: AC
  Filled 2021-12-01: qty 10

## 2021-12-01 MED ORDER — FENTANYL CITRATE PF 50 MCG/ML IJ SOSY: 25.0000 ug | PREFILLED_SYRINGE | INTRAMUSCULAR | Status: DC | PRN

## 2021-12-01 MED ORDER — EPHEDRINE 5 MG/ML INJ
INTRAVENOUS | Status: AC
  Filled 2021-12-01: qty 5

## 2021-12-01 MED ORDER — LIDOCAINE-EPINEPHRINE (PF) 1 %-1:200000 IJ SOLN
INTRAMUSCULAR | Status: DC | PRN
  Administered 2021-12-01: 10 mL

## 2021-12-01 MED ORDER — PHENYLEPHRINE 80 MCG/ML (10ML) SYRINGE FOR IV PUSH (FOR BLOOD PRESSURE SUPPORT)
PREFILLED_SYRINGE | INTRAVENOUS | Status: DC | PRN
  Administered 2021-12-01: 80 ug via INTRAVENOUS
  Administered 2021-12-01 (×2): 160 ug via INTRAVENOUS
  Administered 2021-12-01: 80 ug via INTRAVENOUS
  Administered 2021-12-01 (×4): 160 ug via INTRAVENOUS

## 2021-12-01 MED ORDER — FENTANYL CITRATE (PF) 100 MCG/2ML IJ SOLN
INTRAMUSCULAR | Status: AC
  Filled 2021-12-01: qty 2

## 2021-12-01 MED ORDER — MIDAZOLAM HCL 5 MG/5ML IJ SOLN
INTRAMUSCULAR | Status: DC | PRN
  Administered 2021-12-01: 2 mg via INTRAVENOUS

## 2021-12-01 MED ORDER — ONDANSETRON HCL 4 MG/2ML IJ SOLN
INTRAMUSCULAR | Status: DC | PRN
  Administered 2021-12-01: 4 mg via INTRAVENOUS

## 2021-12-01 MED ORDER — HYDROCODONE-ACETAMINOPHEN 5-325 MG PO TABS: 1.0000 | ORAL_TABLET | Freq: Four times a day (QID) | ORAL | 0 refills | Status: DC | PRN

## 2021-12-01 MED ORDER — CEFAZOLIN SODIUM-DEXTROSE 2-4 GM/100ML-% IV SOLN
2.0000 g | INTRAVENOUS | Status: AC
  Administered 2021-12-01: 2 g via INTRAVENOUS
  Filled 2021-12-01: qty 100

## 2021-12-01 MED ORDER — LIDOCAINE-EPINEPHRINE (PF) 1 %-1:200000 IJ SOLN
INTRAMUSCULAR | Status: AC
  Filled 2021-12-01: qty 30

## 2021-12-01 MED ORDER — SUCCINYLCHOLINE CHLORIDE 200 MG/10ML IV SOSY
PREFILLED_SYRINGE | INTRAVENOUS | Status: AC
  Filled 2021-12-01: qty 10

## 2021-12-01 MED ORDER — DEXAMETHASONE SODIUM PHOSPHATE 10 MG/ML IJ SOLN
INTRAMUSCULAR | Status: AC
  Filled 2021-12-01: qty 1

## 2021-12-01 MED ORDER — DEXAMETHASONE SODIUM PHOSPHATE 4 MG/ML IJ SOLN
INTRAMUSCULAR | Status: DC | PRN
  Administered 2021-12-01: 4 mg via INTRAVENOUS

## 2021-12-01 SURGICAL SUPPLY — 49 items
ADH SKN CLS APL DERMABOND .7 (GAUZE/BANDAGES/DRESSINGS) ×1
ANTNA NRSTM XTRN TELEM NS LF (UROLOGICAL SUPPLIES)
APL PRP STRL LF DISP 70% ISPRP (MISCELLANEOUS) ×1
CABLE EXTEN 4.32 INTERSTIM (NEUROSURGERY SUPPLIES) ×1 IMPLANT
CHLORAPREP W/TINT 26 (MISCELLANEOUS) ×1 IMPLANT
COVER LIGHT HANDLE STERIS (MISCELLANEOUS) ×2 IMPLANT
DERMABOND ADVANCED .7 DNX12 (GAUZE/BANDAGES/DRESSINGS) ×1 IMPLANT
DRAPE C-ARM FOLDED MOBILE STRL (DRAPES) ×1 IMPLANT
DRAPE HALF SHEET 40X57 (DRAPES) ×1 IMPLANT
DRAPE INCISE IOBAN 66X45 STRL (DRAPES) ×1 IMPLANT
DRAPE LAPAROSCOPIC ABDOMINAL (DRAPES) ×1 IMPLANT
DRSG TEGADERM 2-3/8X2-3/4 SM (GAUZE/BANDAGES/DRESSINGS) IMPLANT
DRSG TEGADERM 4X4.75 (GAUZE/BANDAGES/DRESSINGS) ×1 IMPLANT
DRSG TELFA 3X8 NADH (GAUZE/BANDAGES/DRESSINGS) ×1 IMPLANT
ELECT REM PT RETURN 9FT ADLT (ELECTROSURGICAL) ×1
ELECTRODE REM PT RTRN 9FT ADLT (ELECTROSURGICAL) ×1 IMPLANT
ENVELOPE ABSORB ANTIBACTERIAL (Mesh General) ×1 IMPLANT
GLOVE BIOGEL PI IND STRL 7.0 (GLOVE) ×2 IMPLANT
GLOVE SURG SS PI 6.5 STRL IVOR (GLOVE) IMPLANT
GLOVE SURG SS PI 8.0 STRL IVOR (GLOVE) IMPLANT
GOWN STRL REUS W/TWL LRG LVL3 (GOWN DISPOSABLE) ×1 IMPLANT
GOWN STRL REUS W/TWL XL LVL3 (GOWN DISPOSABLE) ×1 IMPLANT
KIT HANDSET INTERSTIM COMM (NEUROSURGERY SUPPLIES) IMPLANT
KIT TURNOVER CYSTO (KITS) ×1 IMPLANT
LEAD INTERSTIM 4.32 28 L (Lead) ×1 IMPLANT
MANIFOLD NEPTUNE II (INSTRUMENTS) IMPLANT
NDL HYPO 21X1.5 SAFETY (NEEDLE) ×1 IMPLANT
NEEDLE HYPO 21X1.5 SAFETY (NEEDLE) ×1 IMPLANT
NEUROSTIMULATOR 1.7X2X.06 (UROLOGICAL SUPPLIES) ×1 IMPLANT
PACK MINOR (CUSTOM PROCEDURE TRAY) ×1 IMPLANT
PAD ARMBOARD 7.5X6 YLW CONV (MISCELLANEOUS) ×1 IMPLANT
PAD DRESSING TELFA 3X8 NADH (GAUZE/BANDAGES/DRESSINGS) ×1 IMPLANT
POUCH TYRX ANTIBAC NEURO MED (Mesh General) IMPLANT
PROGRAMMER ANTENNA EXT (UROLOGICAL SUPPLIES) IMPLANT
PROGRAMMER STIMUL 2.2X1.1X3.7 (UROLOGICAL SUPPLIES) IMPLANT
SET BASIN LINEN APH (SET/KITS/TRAYS/PACK) ×1 IMPLANT
SPONGE GAUZE 2X2 8PLY STRL LF (GAUZE/BANDAGES/DRESSINGS) ×4 IMPLANT
STIMULATOR INTERSTIM 2X1.7X.3 (Miscellaneous) IMPLANT
STRIP CLOSURE SKIN 1/2X4 (GAUZE/BANDAGES/DRESSINGS) IMPLANT
SUT MNCRL AB 4-0 PS2 18 (SUTURE) ×1 IMPLANT
SUT SILK 2 0 (SUTURE)
SUT SILK 2-0 18XBRD TIE 12 (SUTURE) IMPLANT
SUT SILK 3 0 (SUTURE)
SUT SILK 3-0 FS1 18XBRD (SUTURE) IMPLANT
SUT VIC AB 3-0 SH 27 (SUTURE) ×1
SUT VIC AB 3-0 SH 27X BRD (SUTURE) ×1 IMPLANT
SYR BULB IRRIG 60ML STRL (SYRINGE) ×1 IMPLANT
SYR CONTROL 10ML LL (SYRINGE) ×1 IMPLANT
WATER STERILE IRR 500ML POUR (IV SOLUTION) ×1 IMPLANT

## 2021-12-01 NOTE — Interval H&P Note (Signed)
History and Physical Interval Note:  12/01/2021 7:40 AM  Sandra [name]  has presented today for surgery, with the diagnosis of malfunctioning interstim.  The various methods of treatment have been discussed with the patient and family. After consideration of risks, benefits and other options for treatment, the patient has consented to  Procedure(s): REVISION OF INTERSTIM- generator replacement (N/A) as a surgical intervention.  The patient's history has been reviewed, patient examined, no change in status, stable for surgery.  I have reviewed the patient's chart and labs.  Questions were answered to the patient's satisfaction.     [name]

## 2021-12-01 NOTE — Op Note (Signed)
reoperative diagnosis: nonfunctioning interstim, overactive bladder   Postoperative diagnosis: Same   Procedure: 1. Removal of Interstim lead and battery 2. Placement of InterStim stage I and Stage II and impedance check   Surgeon: Dr. [name]   Assistant: None   Antibiotics: Ancef   Drains: None   Indications: The patient is a 46yo with a hx of OAB who currently has an interstim in place which is not functioning. After discussing treatment options she has elected to proceed with Interstim Stage 1 and Stage 2 implantation   Procedure in detail: Prior to the procedure consent was obtained. The patient was brought to the operating room and a brief time out was completed to ensure correct patient, correct procedure and correct site. Preoperative antibiotics were given. Extra care was taken positioning the patient in a prone position. Usual skin preparation was utilized.     A 4 cm left upper buttock incision was made over the previus battery. 10 mL of a lidocaine epinephrine mixture was utilized prior to incision. It was carried down to appropriate depth of the battery. The battery was then brought into the operative field. We then marked the lead insertion into the sacrum with fluoroscopy. We then injected 5cc of lidocaine over the site and then made a 1.5cm incision. We dissected down to the lead. Using gentle traction the lead was removed intact. We then cut the lead and removed both the lead and the battery.    Using lateral and AP fluoroscopy I marked S3. After several minutes of testing I was in the S4 foramina with a bellows response and the foramina was below the bone knuckle noted on x-ray. Eventually I was in S3 on the patient's left side above the knuckle.   Approximate 12 mL of a lidocaine epinephrine mixture was utilized in the midline. Bony table was anesthetized. The 5 inch foramen needle was introduced into the S3 foramina as noted above. At very low setting she had an  excellent bellows and toe response   The inner aspect of the foramen needle was removed and the guide was placed to the appropriate depth removing the framing needle. Under fluoroscopic guidance after making a 1 cm incision the white trocar was passed to the appropriate depth. The lead with a hockey stick bend was placed to the appropriate depth just bridging the bone medially. She had excellent bellows and toe response at all 4 settings. Under fluoroscopic guidance the inner aspect of the lead was removed. X-rays were taken.   With the passer I passed the lead from medial to lateral to the previous battery pocket. We then passed the lead across the midline and to the right upper buttock. The lead was then brought through this incision.  We then attached the generator and battery to the lead and tightened the screws. We then placed the generator in the previous pocket in the right upper buttock. We closed the deep subcutaneous tissue with 2-0 vicryl in a running fashion. The incisions were then closed with running 4-0 monocryl.   Impedance check was done utilizing sterile technique and was normal in all 4 positions We then placed dermabond on the incisions and this concluded the procedure which was well tolerated by the patient.   Complications: none   Condition: stable, transferred to PACU   Plan: The patient is to be discharged home after she voids in the PACU. If she cannot void the foley will be replaced and she will followup in 2-3 days for  a second voiding trial. If she is able to void she will keep a voiding diary and will be reassessed in 1 week for response.

## 2021-12-01 NOTE — Anesthesia Procedure Notes (Signed)
Procedure Name: Intubation Date/Time: 12/01/2021 7:54 AM  Performed by: Lieutenant Diego, CRNAPre-anesthesia Checklist: Patient identified, Emergency Drugs available, Suction available and Patient being monitored Patient Re-evaluated:Patient Re-evaluated prior to induction Oxygen Delivery Method: Circle system utilized Preoxygenation: Pre-oxygenation with 100% oxygen Induction Type: IV induction Ventilation: Mask ventilation without difficulty Laryngoscope Size: Miller and 2 Grade View: Grade I Tube type: Oral Tube size: 7.0 mm Number of attempts: 1 Airway Equipment and Method: Stylet Placement Confirmation: ETT inserted through vocal cords under direct vision, positive ETCO2 and breath sounds checked- equal and bilateral Secured at: 21 cm Tube secured with: Tape Dental Injury: Teeth and Oropharynx as per pre-operative assessment

## 2021-12-01 NOTE — Anesthesia Preprocedure Evaluation (Signed)
Anesthesia Evaluation  Patient identified by MRN, date of birth, ID band Patient awake    Reviewed: Allergy & Precautions, H&P , NPO status , Patient's Chart, lab work & pertinent test results, reviewed documented beta blocker date and time   Airway Mallampati: II  TM Distance: >3 FB Neck ROM: full    Dental no notable dental hx.    Pulmonary neg pulmonary ROS, former smoker   Pulmonary exam normal breath sounds clear to auscultation       Cardiovascular Exercise Tolerance: Good hypertension, + CAD   Rhythm:regular Rate:Normal     Neuro/Psych  PSYCHIATRIC DISORDERS Anxiety Depression Bipolar Disorder    Neuromuscular disease    GI/Hepatic Neg liver ROS,GERD  Medicated,,  Endo/Other  negative endocrine ROSdiabetes    Renal/GU negative Renal ROS  negative genitourinary   Musculoskeletal   Abdominal   Peds  Hematology  (+) Blood dyscrasia, anemia   Anesthesia Other Findings 1. Septal hypokinesis inferior basal akinesis . Left ventricular ejection  fraction, by estimation, is 50 to 55%. The left ventricle has low normal  function. The left ventricle demonstrates regional wall motion  abnormalities (see scoring diagram/findings   for description). Left ventricular diastolic parameters were normal.   2. Right ventricular systolic function is normal. The right ventricular  size is normal.   3. The mitral valve is abnormal. Trivial mitral valve regurgitation. No  evidence of mitral stenosis.   4. The aortic valve is tricuspid. Aortic valve regurgitation is not  visualized. No aortic stenosis is present.   5. The inferior vena cava is normal in size with greater than 50%  respiratory variability, suggesting right atrial pressure of 3 mmHg.    Reproductive/Obstetrics negative OB ROS                             Anesthesia Physical Anesthesia Plan  ASA: 3  Anesthesia Plan: General   Post-op  Pain Management:    Induction:   PONV Risk Score and Plan: Ondansetron  Airway Management Planned:   Additional Equipment:   Intra-op Plan:   Post-operative Plan:   Informed Consent: I have reviewed the patients History and Physical, chart, labs and discussed the procedure including the risks, benefits and alternatives for the proposed anesthesia with the patient or authorized representative who has indicated his/her understanding and acceptance.     Dental Advisory Given  Plan Discussed with: CRNA  Anesthesia Plan Comments:        Anesthesia Quick Evaluation

## 2021-12-01 NOTE — Transfer of Care (Signed)
Immediate Anesthesia Transfer of Care Note  Patient: Sandra [name]  Procedure(s) Performed: REVISION OF INTERSTIM- generator anf lead replacement  Patient Location: PACU  Anesthesia Type:General  Level of Consciousness: drowsy  Airway & Oxygen Therapy: Patient Spontanous Breathing and Patient connected to face mask oxygen  Post-op Assessment: Report given to RN and Post -op Vital signs reviewed and stable  Post vital signs: Reviewed and stable  Last Vitals:  Vitals Value Taken Time  BP 146/73 12/01/21 0930  Temp    Pulse 72 12/01/21 0933  Resp 23 12/01/21 0933  SpO2 100 % 12/01/21 0933  Vitals shown include unvalidated device data.  Last Pain:  Vitals:   12/01/21 0658  TempSrc: Oral  PainSc: 7       Patients Stated Pain Goal: 4 (33/54/56 2563)  Complications: No notable events documented.

## 2021-12-02 ENCOUNTER — Telehealth: Payer: Self-pay

## 2021-12-02 NOTE — Telephone Encounter (Signed)
Patient called office to report she has started with itching after taking hydrocodone- patient is asking for alternative.

## 2021-12-02 NOTE — Anesthesia Postprocedure Evaluation (Signed)
Anesthesia Post Note  Patient: Sandra [name]  Procedure(s) Performed: REVISION OF INTERSTIM- generator anf lead replacement  Patient location during evaluation: Phase II Anesthesia Type: General Level of consciousness: awake Pain management: pain level controlled Vital Signs Assessment: post-procedure vital signs reviewed and stable Respiratory status: spontaneous breathing and respiratory function stable Cardiovascular status: blood pressure returned to baseline and stable Postop Assessment: no headache and no apparent nausea or vomiting Anesthetic complications: no Comments: Late entry   No notable events documented.   Last Vitals:  Vitals:   12/01/21 1011 12/01/21 1013  BP:  132/78  Pulse: 75   Resp: 18   Temp: 36.5 C   SpO2: 96%     Last Pain:  Vitals:   12/02/21 1353  TempSrc:   PainSc: [place]

## 2021-12-08 MED ORDER — HYDROMORPHONE HCL 2 MG PO TABS: 2.0000 mg | ORAL_TABLET | ORAL | 0 refills | Status: DC | PRN

## 2021-12-09 ENCOUNTER — Encounter (HOSPITAL_COMMUNITY): Payer: Self-pay | Admitting: Urology

## 2021-12-09 ENCOUNTER — Telehealth: Payer: Self-pay | Admitting: *Deleted

## 2021-12-09 NOTE — Telephone Encounter (Signed)
I would tell her to watch and wait.  Sometimes people dont see such big spikes in glucose.  Lets hold steady for now.  Have her reach out if glucose is above 300 for 3 tests in a row.

## 2021-12-09 NOTE — Telephone Encounter (Signed)
Patient called and made aware.

## 2021-12-09 NOTE — Telephone Encounter (Signed)
Patient has been sick for 1 month. She went to the doctor today and they started her on a Prednisone dose pack. She is taking 6 for today, then 5 tomorrow and will continue to titrate on down until complete.  She is on the pump for her blood sugars. She wants to know what she do while on the prednisone?

## 2021-12-10 ENCOUNTER — Emergency Department (HOSPITAL_COMMUNITY): Payer: Medicaid Other

## 2021-12-10 ENCOUNTER — Emergency Department (HOSPITAL_COMMUNITY)
Admission: EM | Admit: 2021-12-10 | Discharge: 2021-12-10 | Disposition: A | Discharge status: Home / Self Care | Payer: Medicaid Other | Attending: Emergency Medicine | Admitting: Emergency Medicine

## 2021-12-10 ENCOUNTER — Other Ambulatory Visit: Payer: Self-pay

## 2021-12-10 DIAGNOSIS — R079 Chest pain, unspecified: Secondary | ICD-10-CM | POA: Diagnosis not present

## 2021-12-10 DIAGNOSIS — R0602 Shortness of breath: Secondary | ICD-10-CM | POA: Insufficient documentation

## 2021-12-10 DIAGNOSIS — I10 Essential (primary) hypertension: Secondary | ICD-10-CM | POA: Insufficient documentation

## 2021-12-10 DIAGNOSIS — R051 Acute cough: Secondary | ICD-10-CM | POA: Insufficient documentation

## 2021-12-10 DIAGNOSIS — Z87891 Personal history of nicotine dependence: Secondary | ICD-10-CM | POA: Diagnosis not present

## 2021-12-10 DIAGNOSIS — J189 Pneumonia, unspecified organism: Secondary | ICD-10-CM

## 2021-12-10 DIAGNOSIS — E119 Type 2 diabetes mellitus without complications: Secondary | ICD-10-CM | POA: Diagnosis not present

## 2021-12-10 DIAGNOSIS — I2510 Atherosclerotic heart disease of native coronary artery without angina pectoris: Secondary | ICD-10-CM | POA: Insufficient documentation

## 2021-12-10 DIAGNOSIS — R059 Cough, unspecified: Secondary | ICD-10-CM | POA: Diagnosis present

## 2021-12-10 DIAGNOSIS — Z8551 Personal history of malignant neoplasm of bladder: Secondary | ICD-10-CM | POA: Diagnosis not present

## 2021-12-10 DIAGNOSIS — Z20822 Contact with and (suspected) exposure to covid-19: Secondary | ICD-10-CM | POA: Insufficient documentation

## 2021-12-10 LAB — COMPREHENSIVE METABOLIC PANEL
ALT: 22 U/L (ref 0–44)
AST: 21 U/L (ref 15–41)
Albumin: 3.4 g/dL — ABNORMAL LOW (ref 3.5–5.0)
Alkaline Phosphatase: 126 U/L (ref 38–126)
Anion gap: 8 (ref 5–15)
BUN: 13 mg/dL (ref 6–20)
CO2: 24 mmol/L (ref 22–32)
Calcium: 8.9 mg/dL (ref 8.9–10.3)
Chloride: 104 mmol/L (ref 98–111)
Creatinine, Ser: 0.8 mg/dL (ref 0.44–1.00)
GFR, Estimated: >60 mL/min (ref >60)
Glucose, Bld: 182 mg/dL — ABNORMAL HIGH (ref 70–99)
Potassium: 4.4 mmol/L (ref 3.5–5.1)
Sodium: 136 mmol/L (ref 135–145)
Total Bilirubin: 0.4 mg/dL (ref 0.3–1.2)
Total Protein: 7.2 g/dL (ref 6.5–8.1)

## 2021-12-10 LAB — RESP PANEL BY RT-PCR (FLU A&B, COVID) ARPGX2
Influenza A by PCR: NEGATIVE
Influenza B by PCR: NEGATIVE
SARS Coronavirus 2 by RT PCR: NEGATIVE

## 2021-12-10 LAB — CBC
HCT: 37.5 % (ref 36.0–46.0)
Hemoglobin: 11.7 g/dL — ABNORMAL LOW (ref 12.0–15.0)
MCH: 25 pg — ABNORMAL LOW (ref 26.0–34.0)
MCHC: 31.2 g/dL (ref 30.0–36.0)
MCV: 80.1 fL (ref 80.0–100.0)
Platelets: 434 10*3/uL — ABNORMAL HIGH (ref 150–400)
RBC: 4.68 MIL/uL (ref 3.87–5.11)
RDW: 14.6 % (ref 11.5–15.5)
WBC: 15.5 10*3/uL — ABNORMAL HIGH (ref 4.0–10.5)
nRBC: 0 % (ref 0.0–0.2)

## 2021-12-10 LAB — TROPONIN I (HIGH SENSITIVITY): Troponin I (High Sensitivity): 13 ng/L (ref <18)

## 2021-12-10 MED ORDER — IPRATROPIUM-ALBUTEROL 0.5-2.5 (3) MG/3ML IN SOLN
3.0000 mL | Freq: Once | RESPIRATORY_TRACT | Status: AC
  Administered 2021-12-10: 3 mL via RESPIRATORY_TRACT
  Filled 2021-12-10: qty 3

## 2021-12-10 MED ORDER — LEVOFLOXACIN 750 MG PO TABS: 750.0000 mg | ORAL_TABLET | Freq: Every day | ORAL | 0 refills | Status: AC

## 2021-12-10 MED ORDER — METOCLOPRAMIDE HCL 5 MG/ML IJ SOLN
10.0000 mg | Freq: Once | INTRAMUSCULAR | Status: AC
  Administered 2021-12-10: 10 mg via INTRAVENOUS
  Filled 2021-12-10: qty 2

## 2021-12-10 MED ORDER — IOHEXOL 350 MG/ML SOLN
100.0000 mL | Freq: Once | INTRAVENOUS | Status: AC | PRN
  Administered 2021-12-10: 100 mL via INTRAVENOUS

## 2021-12-10 MED ORDER — ACETAMINOPHEN 500 MG PO TABS
1000.0000 mg | ORAL_TABLET | Freq: Once | ORAL | Status: AC
  Administered 2021-12-10: 1000 mg via ORAL
  Filled 2021-12-10: qty 2

## 2021-12-10 MED ORDER — METHOCARBAMOL 500 MG PO TABS
1000.0000 mg | ORAL_TABLET | Freq: Once | ORAL | Status: AC
  Administered 2021-12-10: 1000 mg via ORAL
  Filled 2021-12-10: qty 2

## 2021-12-10 MED ORDER — HYDROCODONE-ACETAMINOPHEN 5-325 MG PO TABS
1.0000 | ORAL_TABLET | Freq: Once | ORAL | Status: AC
  Administered 2021-12-10: 1 via ORAL
  Filled 2021-12-10: qty 1

## 2021-12-10 NOTE — ED Triage Notes (Signed)
Pt was seen at [facility] yesterday and was dx w/  Bronchitis. Was given prescription for steroids and antibiotics. Pt states she has been coughing worse since last night and is now having CP.

## 2021-12-10 NOTE — ED Provider Notes (Signed)
   Clinical Course as of 12/10/21 0950  Wed Dec 10, 2021  0703 Received sign out pending labs, CTA chest for chest pain, shortness of breath.  [WS]  3174 CT chest demonstrates likely pneumonia in the left lower lobe.  Will start on antibiotics.  Will prescribe Levaquin as patient allergic to penicillins.  Informed patient of need to obtain repeat CT scan in 3 months for resolution. [WS]    Clinical Course User Index [WS] Cristie Hem, MD   Medical Decision Making Amount and/or Complexity of Data Reviewed Labs: ordered. Radiology: ordered.  Risk OTC drugs. Prescription drug management.          Cristie Hem, MD 12/10/21 [phone]

## 2021-12-10 NOTE — ED Provider Notes (Signed)
[facility] Emergency Department Provider Note MRN:  [id number]  Arrival date & time: 12/10/21     Chief Complaint   Cough   History of Present Illness   Sandra [name] is a 46 y.o. year-old female with a history of bipolar disorder, CAD presenting to the ED with chief complaint of cough.  Persistent cough, worsening shortness of breath, now with chest pain.  Has been on antibiotics for bronchitis but not helping, getting worse.  Cannot stop coughing.  Review of Systems  A thorough review of systems was obtained and all systems are negative except as noted in the HPI and PMH.   Patient's Health History    Past Medical History:  Diagnosis Date   ADHD (attention deficit hyperactivity disorder)    Allergic rhinitis    Anemia    hx of   Barrett's esophagus    Bipolar 1 disorder (HCC)    Bladder tumor    CAD (coronary artery disease)    Cancer ([place])    bladder   Constipation    Dilated cardiomyopathy ([place]) 12/20/2020   Echo 6/22 ([facility]): EF 43, mild MR, mild TR // Echo 3/22: EF 50-55   Dizziness    Full dentures    GERD (gastroesophageal reflux disease)    Gross hematuria    Hyperlipidemia    Hypertension    Stable angina    Type 2 diabetes mellitus ([place])    Urgency of urination    dysuria, sui   Wears glasses     Past Surgical History:  Procedure Laterality Date   BALLOON DILATION N/A 02/17/2021   Procedure: BALLOON DILATION;  Surgeon: Eloise Harman, DO;  [facility];  Service: Endoscopy;  Laterality: N/A;   BIOPSY  02/17/2021   Procedure: BIOPSY;  Surgeon: Eloise Harman, DO;  [facility];  Service: Endoscopy;;   COLONOSCOPY WITH PROPOFOL N/A 02/17/2021   Surgeon: Eloise Harman, DO; nonbleeding internal hemorrhoids, otherwise normal exam.  Repeat in 10 years.   CYSTOSCOPY N/A 10/03/2015   Procedure: CYSTOSCOPY;  Surgeon: Irine Seal, MD;  Location: [facility];  Service: Urology;   Laterality: N/A;   CYSTOSCOPY W/ URETERAL STENT PLACEMENT Right 07/24/2021   Procedure: CYSTOSCOPY WITH RETROGRADE PYELOGRAM/URETERAL STENT PLACEMENT;  Surgeon: Cleon Gustin, MD;  Location: [facility];  Service: Urology;  Laterality: Right;   CYSTOSCOPY WITH RETROGRADE PYELOGRAM, URETEROSCOPY AND STENT PLACEMENT Right 08/14/2021   Procedure: CYSTOSCOPY WITH RETROGRADE PYELOGRAM, URETEROSCOPY AND STENT EXCHANGE;  Surgeon: Cleon Gustin, MD;  Location: [facility];  Service: Urology;  Laterality: Right;   CYSTOSCOPY WITH STENT PLACEMENT Right 10/31/2015   Procedure: CYSTOSCOPY WITH ATTEMPTED RIGHT URETERAL OPENING;  Surgeon: Irine Seal, MD;  Location: [facility];  Service: Urology;  Laterality: Right;   ESOPHAGOGASTRODUODENOSCOPY (EGD) WITH PROPOFOL N/A 02/17/2021   Surgeon: Eloise Harman, DO; 2 cm hiatal hernia, Barrett's esophagus, gastritis with biopsies negative for H. pylori.  S/p empiric esophageal dilation.  Repeat in 3 years.   FOOT SURGERY Left    September 2022, broke 7 bones in left foot   INTERSTIM IMPLANT PLACEMENT N/A 03/14/2018   Procedure: Barrie Lyme IMPLANT FIRST STAGE;  Surgeon: Cleon Gustin, MD;  Location: [facility];  Service: Urology;  Laterality: N/A;   INTERSTIM IMPLANT PLACEMENT N/A 03/30/2018   Procedure: Barrie Lyme IMPLANT SECOND STAGE;  Surgeon: Cleon Gustin, MD;  Location: [facility];  Service: Urology;  Laterality: N/A;   INTERSTIM IMPLANT REVISION  N/A 12/01/2021   Procedure: REVISION OF Barrie Lyme- generator anf lead replacement;  Surgeon: Cleon Gustin, MD;  [facility];  Service: Urology;  Laterality: N/A;   LEFT HEART CATH AND CORONARY ANGIOGRAPHY N/A 05/31/2020   Procedure: LEFT HEART CATH AND CORONARY ANGIOGRAPHY;  Surgeon: Troy Sine, MD;  Location: [facility];  Service: Cardiovascular;  Laterality: N/A;   MULTIPLE TOOTH EXTRACTIONS  2015   OVARIAN CYST REMOVAL Right 2004 approx   SHOULDER OPEN ROTATOR CUFF REPAIR  Left 04/20/2019   Procedure: ROTATOR CUFF REPAIR SHOULDER OPEN;  Surgeon: Carole Civil, MD;  Location: [facility];  Service: Orthopedics;  Laterality: Left;   TONSILLECTOMY  12/30/2004   TOTAL ABDOMINAL HYSTERECTOMY W/ BILATERAL SALPINGOOPHORECTOMY  05/16/2004   TRANSURETHRAL RESECTION OF BLADDER TUMOR N/A 10/03/2015   Procedure: TRANSURETHRAL RESECTION OF BLADDER TUMOR (TURBT);  Surgeon: Irine Seal, MD;  Location: [facility];  Service: Urology;  Laterality: N/A;   TRANSURETHRAL RESECTION OF BLADDER TUMOR N/A 10/31/2015   Procedure: RE-STAGINGG TRANSURETHRAL RESECTION OF BLADDER TUMOR (TURBT);  Surgeon: Irine Seal, MD;  Location: [facility];  Service: Urology;  Laterality: N/A;   TRANSURETHRAL RESECTION OF BLADDER TUMOR N/A 08/17/2016   Procedure: TRANSURETHRAL RESECTION OF BLADDER TUMOR (TURBT);  Surgeon: Cleon Gustin, MD;  [facility];  Service: Urology;  Laterality: N/A;   URETERAL BIOPSY Right 08/14/2021   Procedure: URETERAL BIOPSY;  Surgeon: Cleon Gustin, MD;  Location[facility];  Service: Urology;  Laterality: Right;    Family History  Problem Relation Age of Onset   Hypertension Mother    Cancer Mother    Breast cancer Maternal Aunt    Lung cancer Maternal Uncle    Cancer Other    Diabetes Daughter    Colon cancer Neg Hx     Social History   Socioeconomic History   Marital status: Married    Spouse name: Not on file   Number of children: Not on file   Years of education: Not on file   Highest education level: Not on file  Occupational History   Not on file  Tobacco Use   Smoking status: Former    Packs/day: 0.50    Years: 10.00    Total pack years: 5.00    Types: Cigarettes    Quit date: 01/31/2021    Years since quitting: 0.8   Smokeless tobacco: Never   Tobacco comments:    02/06/21-no smoking past 5 days  Vaping Use   Vaping Use: Never used  Substance and Sexual Activity   Alcohol use: No   Drug use: No    Sexual activity: Not Currently    Birth control/protection: Surgical    Comment: hyst  Other Topics Concern   Not on file  Social History Narrative   Not on file   Social Determinants of Health   Financial Resource Strain: Not on file  Food Insecurity: Not on file  Transportation Needs: Not on file  Physical Activity: Not on file  Stress: Not on file  Social Connections: Not on file  Intimate Partner Violence: Not on file     Physical Exam   Vitals:   12/10/21 0633  BP: (!) 175/81  Pulse: 87  Resp: (!) 24  Temp: 97.7 F (36.5 C)  SpO2: 98%    CONSTITUTIONAL: Chronically ill-appearing, NAD NEURO/PSYCH:  Alert and oriented x 3, no focal deficits EYES:  eyes equal and reactive ENT/NECK:  no LAD, no JVD CARDIO:  Regular rate, well-perfused, normal S1 and S2 PULM:  CTAB no wheezing or rhonchi GI/GU:  non-distended, non-tender MSK/SPINE:  No gross deformities, no edema SKIN:  no rash, atraumatic   *Additional and/or pertinent findings included in MDM below  Diagnostic and Interventional Summary    EKG Interpretation  Date/Time:  Wednesday December 10 2021 06:35:53 EST Ventricular Rate:  82 PR Interval:  151 QRS Duration: 109 QT Interval:  426 QTC Calculation: 498 R Axis:   101 Text Interpretation: Sinus rhythm Probable left atrial enlargement Right axis deviation Repol abnrm suggests ischemia, inferior leads Confirmed by Gerlene Fee [id number]) on 12/10/2021 6:41:57 AM       Labs Reviewed  RESP PANEL BY RT-PCR (FLU A&B, COVID) [id number]  CBC  COMPREHENSIVE METABOLIC PANEL  TROPONIN I (HIGH SENSITIVITY)    DG Chest Port 1 View    (Results Pending)  CT Angio Chest Pulmonary Embolism (PE) W or WO Contrast    (Results Pending)    Medications  ipratropium-albuterol (DUONEB) 0.5-2.5 (3) MG/3ML nebulizer solution 3 mL (has no administration in time range)  acetaminophen (TYLENOL) tablet 1,000 mg (1,000 mg Oral Given 12/10/21 0646)  methocarbamol (ROBAXIN) tablet  1,000 mg (1,000 mg Oral Given 12/10/21 5625)     Procedures  /  Critical Care Procedures  ED Course and Medical Decision Making  Initial Impression and Ddx Differential diagnosis includes viral illness, pneumonia, pneumothorax, PE, ACS  Past medical/surgical history that increases complexity of ED encounter: None  Interpretation of Diagnostics I personally reviewed the EKG and my interpretation is as follows: Wandering baseline, nonspecific findings  Labs, chest x-ray, CT pending  Patient Reassessment and Ultimate Disposition/Management     Signed out to oncoming provider, anticipating discharge if workup reassuring and patient feeling better after medications.  Patient management required discussion with the following services or consulting groups:  None  Complexity of Problems Addressed Acute illness or injury that poses threat of life of bodily function  Additional Data Reviewed and Analyzed Further history obtained from: None  Additional Factors Impacting ED Encounter Risk None  Barth Kirks. [place]onia Small, [place] [email or web address]  Final Clinical Impressions(s) / ED Diagnoses     ICD-10-CM   1. Acute cough  R05.1     2. Chest pain, unspecified type  R07.9     3. SOB (shortness of breath)  R06.02       ED Discharge Orders     None        Discharge Instructions Discussed with and Provided to Patient:   Discharge Instructions   None      Maudie Flakes, MD 12/10/21 [phone]

## 2021-12-10 NOTE — ED Notes (Signed)
MD notified re: pts request for pain meds

## 2021-12-10 NOTE — Discharge Instructions (Addendum)
Your CT chest showed a pneumonia.  Please get a repeat CT scan in 3 months to make sure that it is resolved.

## 2021-12-11 ENCOUNTER — Telehealth: Payer: Self-pay | Admitting: *Deleted

## 2021-12-11 NOTE — Telephone Encounter (Signed)
She can increase her basal rate by 1 unit an hour.  And encourage her to bolus appropriately with meals as well.  Then have her reach out and let me know how her readings are Monday.

## 2021-12-11 NOTE — Telephone Encounter (Signed)
Patient was called and made aware. 

## 2021-12-11 NOTE — Telephone Encounter (Signed)
Sandra [name] left a message stating that her blood sugars have been over 300 3x in a row. She was calling back to get directions on what she needed to do. She has 3 more days of Prednisone left.

## 2021-12-12 ENCOUNTER — Encounter: Payer: Self-pay | Admitting: Physician Assistant

## 2021-12-12 ENCOUNTER — Ambulatory Visit (INDEPENDENT_AMBULATORY_CARE_PROVIDER_SITE_OTHER): Payer: Medicaid Other | Admitting: Physician Assistant

## 2021-12-12 VITALS — BP 149/96 | HR 78

## 2021-12-12 DIAGNOSIS — N3281 Overactive bladder: Secondary | ICD-10-CM

## 2021-12-12 MED ORDER — TAMSULOSIN HCL 0.4 MG PO CAPS: 0.4000 mg | ORAL_CAPSULE | Freq: Every day | ORAL | 3 refills | Status: DC

## 2021-12-12 NOTE — Progress Notes (Signed)
Assessment: 1. OAB (overactive bladder) - Urinalysis, Routine w reflex microscopic    Plan: Discussed findings with Dr. Alyson Ingles who feels her discomfort is likely coming from pocket placed during procedure to prevent infection.  Explained this to the patient at length.  Flomax refilled today and patient is reassured that her incisions look good she is advised on keeping them clean and dry.  She will add occasional ibuprofen to her current pain medications for her discomfort and will avoid sitting and lying directly on her incision sites is much as possible.  Follow-up in 3 months for UA and PVR with Dr. Alyson Ingles.   Chief Complaint: No chief complaint on file.   HPI: Sandra [name] is a 46 y.o. female who presents for post op evaluation of revision of InterStim generator and lead replacement performed on 12/01/2021.  She is now 11 days postop.  She complains of pain and tenderness over the central and right-sided incisions which make it difficult for her to sit or lie down on her back.  No drainage noted.  No fever, chills.  The patient feels the stimulator working and her symptoms have improved significantly since the exchange.   Portions of the above documentation were copied from a prior visit for review purposes only.  Allergies: Allergies  Allergen Reactions   Ibuprofen Hives    itching itching itching   Benazepril Hives   Diphenhydramine Hives    Other reaction(s): hives Other reaction(s): hives   Latex Dermatitis    States she had redness to skin and itching    Metronidazole Nausea And Vomiting    Other reaction(s): nausea Other reaction(s): nausea   Oxycodone     Pt has tolerated hydromorphone in the past and takes Norco at home.   Toradol [Ketorolac Tromethamine] Itching   Zofran [Ondansetron Hcl] Nausea And Vomiting   Adhesive [Tape] Rash   Codeine Nausea And Vomiting and Rash    Other reaction(s): hives   Loratadine Rash   Nystatin-Triamcinolone Rash    Penicillins Rash    Has patient had a PCN reaction causing immediate rash, facial/tongue/throat swelling, SOB or lightheadedness with hypotension: No Has patient had a PCN reaction causing severe rash involving mucus membranes or skin necrosis: Yes Has patient had a PCN reaction that required hospitalization: No Has patient had a PCN reaction occurring within the last 10 years: no  If all of the above answers are "NO", then may proceed with Cephalosporin use. Other reaction(s): nausea Received ancef w/o issue (7/23)    PMH: Past Medical History:  Diagnosis Date   ADHD (attention deficit hyperactivity disorder)    Allergic rhinitis    Anemia    hx of   Barrett's esophagus    Bipolar 1 disorder (HCC)    Bladder tumor    CAD (coronary artery disease)    Cancer ([place])    bladder   Constipation    Dilated cardiomyopathy ([place]) 12/20/2020   Echo 6/22 ([facility]): EF 43, mild MR, mild TR // Echo 3/22: EF 50-55   Dizziness    Full dentures    GERD (gastroesophageal reflux disease)    Gross hematuria    Hyperlipidemia    Hypertension    Stable angina    Type 2 diabetes mellitus (HCC)    Urgency of urination    dysuria, sui   Wears glasses     PSH: Past Surgical History:  Procedure Laterality Date   BALLOON DILATION N/A 02/17/2021   Procedure: BALLOON DILATION;  Surgeon:  Eloise Harman, DO;  [facility];  Service: Endoscopy;  Laterality: N/A;   BIOPSY  02/17/2021   Procedure: BIOPSY;  Surgeon: Eloise Harman, DO;  [facility];  Service: Endoscopy;;   COLONOSCOPY WITH PROPOFOL N/A 02/17/2021   Surgeon: Eloise Harman, DO; nonbleeding internal hemorrhoids, otherwise normal exam.  Repeat in 10 years.   CYSTOSCOPY N/A 10/03/2015   Procedure: CYSTOSCOPY;  Surgeon: Irine Seal, MD;  Location: [facility];  Service: Urology;  Laterality: N/A;   CYSTOSCOPY W/ URETERAL STENT PLACEMENT Right 07/24/2021   Procedure: CYSTOSCOPY WITH RETROGRADE  PYELOGRAM/URETERAL STENT PLACEMENT;  Surgeon: Cleon Gustin, MD;  Location: [facility];  Service: Urology;  Laterality: Right;   CYSTOSCOPY WITH RETROGRADE PYELOGRAM, URETEROSCOPY AND STENT PLACEMENT Right 08/14/2021   Procedure: CYSTOSCOPY WITH RETROGRADE PYELOGRAM, URETEROSCOPY AND STENT EXCHANGE;  Surgeon: Cleon Gustin, MD;  Location: [facility];  Service: Urology;  Laterality: Right;   CYSTOSCOPY WITH STENT PLACEMENT Right 10/31/2015   Procedure: CYSTOSCOPY WITH ATTEMPTED RIGHT URETERAL OPENING;  Surgeon: Irine Seal, MD;  Location[facility];  Service: Urology;  Laterality: Right;   ESOPHAGOGASTRODUODENOSCOPY (EGD) WITH PROPOFOL N/A 02/17/2021   Surgeon: Eloise Harman, DO; 2 cm hiatal hernia, Barrett's esophagus, gastritis with biopsies negative for H. pylori.  S/p empiric esophageal dilation.  Repeat in 3 years.   FOOT SURGERY Left    September 2022, broke 7 bones in left foot   INTERSTIM IMPLANT PLACEMENT N/A 03/14/2018   Procedure: Barrie Lyme IMPLANT FIRST STAGE;  Surgeon: Cleon Gustin, MD;  Location: [facility];  Service: Urology;  Laterality: N/A;   INTERSTIM IMPLANT PLACEMENT N/A 03/30/2018   Procedure: Barrie Lyme IMPLANT SECOND STAGE;  Surgeon: Cleon Gustin, MD;  Location: [facility];  Service: Urology;  Laterality: N/A;   INTERSTIM IMPLANT REVISION N/A 12/01/2021   Procedure: REVISION OF Barrie Lyme- generator anf lead replacement;  Surgeon: Cleon Gustin, MD;  Location: [facility];  Service: Urology;  Laterality: N/A;   LEFT HEART CATH AND CORONARY ANGIOGRAPHY N/A 05/31/2020   Procedure: LEFT HEART CATH AND CORONARY ANGIOGRAPHY;  Surgeon: Troy Sine, MD;  Location: [facility];  Service: Cardiovascular;  Laterality: N/A;   MULTIPLE TOOTH EXTRACTIONS  2015   OVARIAN CYST REMOVAL Right 2004 approx   SHOULDER OPEN ROTATOR CUFF REPAIR Left 04/20/2019   Procedure: ROTATOR CUFF REPAIR SHOULDER OPEN;  Surgeon: Carole Civil, MD;  Location: [facility];  Service: Orthopedics;  Laterality: Left;   TONSILLECTOMY  12/30/2004   TOTAL ABDOMINAL HYSTERECTOMY W/ BILATERAL SALPINGOOPHORECTOMY  05/16/2004   TRANSURETHRAL RESECTION OF BLADDER TUMOR N/A 10/03/2015   Procedure: TRANSURETHRAL RESECTION OF BLADDER TUMOR (TURBT);  Surgeon: Irine Seal, MD;  Location: [facility];  Service: Urology;  Laterality: N/A;   TRANSURETHRAL RESECTION OF BLADDER TUMOR N/A 10/31/2015   Procedure: RE-STAGINGG TRANSURETHRAL RESECTION OF BLADDER TUMOR (TURBT);  Surgeon: Irine Seal, MD;  Location: [facility];  Service: Urology;  Laterality: N/A;   TRANSURETHRAL RESECTION OF BLADDER TUMOR N/A 08/17/2016   Procedure: TRANSURETHRAL RESECTION OF BLADDER TUMOR (TURBT);  Surgeon: Cleon Gustin, MD;  Location[facility];  Service: Urology;  Laterality: N/A;   URETERAL BIOPSY Right 08/14/2021   Procedure: URETERAL BIOPSY;  Surgeon: Cleon Gustin, MD;  Location: [facility];  Service: Urology;  Laterality: Right;    SH: Social History   Tobacco Use   Smoking status: Former    Packs/day: 0.50  Years: 10.00    Total pack years: 5.00    Types: Cigarettes    Quit date: 01/31/2021    Years since quitting: 0.8   Smokeless tobacco: Never   Tobacco comments:    02/06/21-no smoking past 5 days  Vaping Use   Vaping Use: Never used  Substance Use Topics   Alcohol use: No   Drug use: No    ROS: All other review of systems were reviewed and are negative except what is noted above in HPI  PE: BP (!) 149/96   Pulse 78  GENERAL APPEARANCE:  Well appearing, well developed, NAD HEENT:  Atraumatic, normocephalic NECK:  Supple. Trachea midline ABDOMEN:  Soft, non-tender, no masses Back: Incisions healing nicely.  Central incision is moderately tender without swelling, drainage, fluctuance, induration, or surrounding erythema.  Mild erythematous changes where skin glue is flaking.  Right-sided incision with similar tenderness.  Otherwise  healing nicely.  Horizontal incision on the left without tenderness, drainage, erythema. EXTREMITIES:  Moves all extremities well NEUROLOGIC:  Alert and oriented x 3 MENTAL STATUS:  appropriate SKIN:  Warm, dry, and intact   Results: Laboratory Data: Lab Results  Component Value Date   WBC 15.5 (H) 12/10/2021   HGB 11.7 (L) 12/10/2021   HCT 37.5 12/10/2021   MCV 80.1 12/10/2021   PLT 434 (H) 12/10/2021    Lab Results  Component Value Date   CREATININE 0.80 12/10/2021    Lab Results  Component Value Date   HGBA1C 7.5 (A) 10/14/2021    Urinalysis    Component Value Date/Time   COLORURINE YELLOW 07/26/2021 1739   APPEARANCEUR Cloudy (A) 09/12/2021 1131   LABSPEC 1.022 07/26/2021 1739   PHURINE 7.0 07/26/2021 1739   GLUCOSEU Negative 09/12/2021 1131   HGBUR LARGE (A) 07/26/2021 1739   BILIRUBINUR neg 11/03/2021 1019   BILIRUBINUR Negative 09/12/2021 1131   KETONESUR NEGATIVE 07/26/2021 1739   PROTEINUR Positive (A) 11/03/2021 1019   PROTEINUR 1+ (A) 09/12/2021 1131   PROTEINUR 100 (A) 07/26/2021 1739   UROBILINOGEN 0.2 11/03/2021 1019   UROBILINOGEN 0.2 03/01/2011 1920   NITRITE neg 11/03/2021 1019   NITRITE Negative 09/12/2021 1131   NITRITE NEGATIVE 07/26/2021 1739   LEUKOCYTESUR Small (1+) (A) 11/03/2021 1019   LEUKOCYTESUR Negative 09/12/2021 1131   LEUKOCYTESUR TRACE (A) 07/26/2021 1739    Lab Results  Component Value Date   LABMICR Comment 09/12/2021   WBCUA 0-5 06/18/2021   LABEPIT 0-10 06/18/2021   MUCUS Present 06/18/2021   BACTERIA NONE SEEN 07/26/2021    Pertinent Imaging: No results found for this or any previous visit.  No results found for this or any previous visit.  No results found for this or any previous visit.  No results found for this or any previous visit.  Results for orders placed during the hospital encounter of 09/04/21  US RENAL  Narrative CLINICAL DATA:  Follow-up known right renal calcification.  EXAM: RENAL /  URINARY TRACT ULTRASOUND COMPLETE  COMPARISON:  July 21, 2021 renal ultrasound  FINDINGS: Right Kidney:  Renal measurements: 10.4 x 4.3 x 5.7 cm = volume: 133 mL. Echogenicity within normal limits. No mass or hydronephrosis visualized.  Left Kidney:  Renal measurements: 11.9 x 5.7 x 4.8 cm = volume: 172 mL. Echogenicity within normal limits. No mass or hydronephrosis visualized.  Bladder:  Appears normal for degree of bladder distention.  Other:  None.  IMPRESSION: No renal stones or obstruction identified. The bladder is unremarkable.   Electronically Signed By:  Dorise Bullion III M.D. On: 09/04/2021 17:00  No valid procedures specified. No results found for this or any previous visit.  Results for orders placed in visit on 08/25/21  CT RENAL STONE STUDY  Narrative CLINICAL DATA:  Right-sided flank pain hematuria for 6 weeks.  EXAM: CT ABDOMEN AND PELVIS WITHOUT CONTRAST  TECHNIQUE: Multidetector CT imaging of the abdomen and pelvis was performed following the standard protocol without IV contrast.  RADIATION DOSE REDUCTION: This exam was performed according to the departmental dose-optimization program which includes automated exposure control, adjustment of the mA and/or kV according to patient size and/or use of iterative reconstruction technique.  COMPARISON:  Prior CT scans 07/26/2021 and 07/18/2021  FINDINGS: Lower chest: The lung bases are clear of acute process. No pleural effusion or pulmonary lesions. The heart is normal in size. No pericardial effusion. The distal esophagus and aorta are unremarkable.  Hepatobiliary: No focal hepatic lesions or intrahepatic biliary dilatation. The gallbladder is normal. No common bile duct dilatation.  Pancreas: No mass, inflammation or ductal dilatation.  Spleen: Normal size. No focal lesions.  Adrenals/Urinary Tract: Adrenal glands are normal.  The right-sided double-J ureteral stent is been  removed. There is very mild right-sided hydroureter but no obstructing ureteral calculi.  Stomach/Bowel: The stomach, duodenum, small bowel and colon are grossly normal without oral contrast. No inflammatory changes, mass lesions or obstructive findings.  Vascular/Lymphatic: Age advanced atherosclerotic calcifications involving the aorta and iliac arteries. No mesenteric or retroperitoneal mass or adenopathy.  Reproductive: The uterus and ovaries are is surgically absent.  Other: Stable small periumbilical abdominal hernia containing fat. AP left-sided sacral nerve root stimulator is noted.  Musculoskeletal: No significant bony findings.  IMPRESSION: 1. Interval removal of the right-sided double-J ureteral stent with very mild right-sided hydroureter but no obstructing ureteral calculi. 2. No acute abdominal/pelvic findings, mass lesions or adenopathy. 3. Age advanced atherosclerotic calcifications involving the aorta and iliac arteries.  Aortic Atherosclerosis (ICD10-I70.0).   Electronically Signed By: Marijo Sanes M.D. On: 09/04/2021 11:39  No results found for this or any previous visit (from the past 24 hour(s)).

## 2021-12-15 ENCOUNTER — Ambulatory Visit: Payer: Medicaid Other | Admitting: Physician Assistant

## 2021-12-29 ENCOUNTER — Other Ambulatory Visit (HOSPITAL_COMMUNITY): Payer: Self-pay | Admitting: Family Medicine

## 2021-12-29 DIAGNOSIS — M545 Low back pain, unspecified: Secondary | ICD-10-CM

## 2021-12-31 ENCOUNTER — Telehealth: Payer: Self-pay

## 2021-12-31 NOTE — Telephone Encounter (Signed)
Patient call in today and voiced that she is having pain in the tailbone after having interStim generator surgery on 11/20 and her stiches haven't came out. Made patient aware that she has disposable stiches and an antibacterial patch that will help to prevent infection. Made patient aware that her stiches will dissolve on their own. Made patient aware that Dr. Alyson Ingles first available appt will be in January and if she is having any trouble form now until her appt she can go to the ED or the urgent care. Patient voiced understanding

## 2022-01-14 ENCOUNTER — Ambulatory Visit (INDEPENDENT_AMBULATORY_CARE_PROVIDER_SITE_OTHER): Payer: Medicaid Other | Admitting: Nurse Practitioner

## 2022-01-14 ENCOUNTER — Encounter: Payer: Self-pay | Admitting: Nurse Practitioner

## 2022-01-14 VITALS — BP 157/90 | HR 89 | Ht 60.0 in | Wt 186.4 lb

## 2022-01-14 DIAGNOSIS — E782 Mixed hyperlipidemia: Secondary | ICD-10-CM | POA: Diagnosis not present

## 2022-01-14 DIAGNOSIS — I10 Essential (primary) hypertension: Secondary | ICD-10-CM | POA: Diagnosis not present

## 2022-01-14 DIAGNOSIS — E1165 Type 2 diabetes mellitus with hyperglycemia: Secondary | ICD-10-CM

## 2022-01-14 LAB — POCT GLYCOSYLATED HEMOGLOBIN (HGB A1C): Hemoglobin A1C: 6.8 % — AB (ref 4.0–5.6)

## 2022-01-14 MED ORDER — OMNIPOD DASH PODS (GEN 4) MISC: 6 refills | Status: DC

## 2022-01-14 MED ORDER — INSULIN ASPART 100 UNIT/ML IJ SOLN: INTRAMUSCULAR | 6 refills | Status: DC

## 2022-01-14 MED ORDER — GABAPENTIN 400 MG PO CAPS: 400.0000 mg | ORAL_CAPSULE | Freq: Three times a day (TID) | ORAL | 1 refills | Status: DC

## 2022-01-14 NOTE — Progress Notes (Signed)
01/14/2022, 4:23 PM                   Endocrinology follow-up note   Subjective:    Patient ID: Sandra [name], female    DOB: January 25, 1975.  Sandra [name] is being seen in follow-up  for management of currently uncontrolled symptomatic type 2  diabetes requested by  Coolidge Breeze, FNP.   Past Medical History:  Diagnosis Date   ADHD (attention deficit hyperactivity disorder)    Allergic rhinitis    Anemia    hx of   Barrett's esophagus    Bipolar 1 disorder (HCC)    Bladder tumor    CAD (coronary artery disease)    Cancer ([place])    bladder   Constipation    Dilated cardiomyopathy ([place]) 12/20/2020   Echo 6/22 ([facility]): EF 43, mild MR, mild TR // Echo 3/22: EF 50-55   Dizziness    Full dentures    GERD (gastroesophageal reflux disease)    Gross hematuria    Hyperlipidemia    Hypertension    Stable angina    Type 2 diabetes mellitus ([place])    Urgency of urination    dysuria, sui   Wears glasses     Past Surgical History:  Procedure Laterality Date   BALLOON DILATION N/A 02/17/2021   Procedure: BALLOON DILATION;  Surgeon: Eloise Harman, DO;  [facility];  Service: Endoscopy;  Laterality: N/A;   BIOPSY  02/17/2021   Procedure: BIOPSY;  Surgeon: Eloise Harman, DO;  [facility];  Service: Endoscopy;;   COLONOSCOPY WITH PROPOFOL N/A 02/17/2021   Surgeon: Eloise Harman, DO; nonbleeding internal hemorrhoids, otherwise normal exam.  Repeat in 10 years.   CYSTOSCOPY N/A 10/03/2015   Procedure: CYSTOSCOPY;  Surgeon: Irine Seal, MD;  Location: [facility];  Service: Urology;  Laterality: N/A;   CYSTOSCOPY W/ URETERAL STENT PLACEMENT Right 07/24/2021   Procedure: CYSTOSCOPY WITH RETROGRADE PYELOGRAM/URETERAL STENT PLACEMENT;  Surgeon: Cleon Gustin, MD;  Location: [facility];  Service: Urology;  Laterality: Right;   CYSTOSCOPY WITH RETROGRADE PYELOGRAM,  URETEROSCOPY AND STENT PLACEMENT Right 08/14/2021   Procedure: CYSTOSCOPY WITH RETROGRADE PYELOGRAM, URETEROSCOPY AND STENT EXCHANGE;  Surgeon: Cleon Gustin, MD;  Location: [facility];  Service: Urology;  Laterality: Right;   CYSTOSCOPY WITH STENT PLACEMENT Right 10/31/2015   Procedure: CYSTOSCOPY WITH ATTEMPTED RIGHT URETERAL OPENING;  Surgeon: Irine Seal, MD;  Location: [facility];  Service: Urology;  Laterality: Right;   ESOPHAGOGASTRODUODENOSCOPY (EGD) WITH PROPOFOL N/A 02/17/2021   Surgeon: Eloise Harman, DO; 2 cm hiatal hernia, Barrett's esophagus, gastritis with biopsies negative for H. pylori.  S/p empiric esophageal dilation.  Repeat in 3 years.   FOOT SURGERY Left    September 2022, broke 7 bones in left foot   INTERSTIM IMPLANT PLACEMENT N/A 03/14/2018   Procedure: Barrie Lyme IMPLANT FIRST STAGE;  Surgeon: Cleon Gustin, MD;  Location[facility];  Service: Urology;  Laterality: N/A;   INTERSTIM IMPLANT PLACEMENT N/A 03/30/2018   Procedure: Barrie Lyme IMPLANT SECOND STAGE;  Surgeon: Cleon Gustin, MD;  Location[facility];  Service: Urology;  Laterality: N/A;   INTERSTIM IMPLANT REVISION N/A 12/01/2021   Procedure: REVISION OF Barrie Lyme- generator anf lead replacement;  Surgeon: Cleon Gustin, MD;  [facility];  Service: Urology;  Laterality: N/A;   LEFT HEART CATH AND CORONARY ANGIOGRAPHY N/A 05/31/2020   Procedure: LEFT HEART CATH AND CORONARY ANGIOGRAPHY;  Surgeon: Troy Sine, MD;  Location: [facility];  Service: Cardiovascular;  Laterality: N/A;   MULTIPLE TOOTH EXTRACTIONS  2015   OVARIAN CYST REMOVAL Right 2004 approx   SHOULDER OPEN ROTATOR CUFF REPAIR Left 04/20/2019   Procedure: ROTATOR CUFF REPAIR SHOULDER OPEN;  Surgeon: Carole Civil, MD;  Location: [facility];  Service: Orthopedics;  Laterality: Left;   TONSILLECTOMY  12/30/2004   TOTAL ABDOMINAL HYSTERECTOMY W/ BILATERAL SALPINGOOPHORECTOMY  05/16/2004   TRANSURETHRAL  RESECTION OF BLADDER TUMOR N/A 10/03/2015   Procedure: TRANSURETHRAL RESECTION OF BLADDER TUMOR (TURBT);  Surgeon: Irine Seal, MD;  Location: [facility];  Service: Urology;  Laterality: N/A;   TRANSURETHRAL RESECTION OF BLADDER TUMOR N/A 10/31/2015   Procedure: RE-STAGINGG TRANSURETHRAL RESECTION OF BLADDER TUMOR (TURBT);  Surgeon: Irine Seal, MD;  Location: [facility];  Service: Urology;  Laterality: N/A;   TRANSURETHRAL RESECTION OF BLADDER TUMOR N/A 08/17/2016   Procedure: TRANSURETHRAL RESECTION OF BLADDER TUMOR (TURBT);  Surgeon: Cleon Gustin, MD;  [facility];  Service: Urology;  Laterality: N/A;   URETERAL BIOPSY Right 08/14/2021   Procedure: URETERAL BIOPSY;  Surgeon: Cleon Gustin, MD;  Location[facility];  Service: Urology;  Laterality: Right;    Social History   Socioeconomic History   Marital status: Married    Spouse name: Not on file   Number of children: Not on file   Years of education: Not on file   Highest education level: Not on file  Occupational History   Not on file  Tobacco Use   Smoking status: Former    Packs/day: 0.50    Years: 10.00    Total pack years: 5.00    Types: Cigarettes    Quit date: 01/31/2021    Years since quitting: 0.9   Smokeless tobacco: Never   Tobacco comments:    02/06/21-no smoking past 5 days  Vaping Use   Vaping Use: Never used  Substance and Sexual Activity   Alcohol use: No   Drug use: No   Sexual activity: Not Currently    Birth control/protection: Surgical    Comment: hyst  Other Topics Concern   Not on file  Social History Narrative   Not on file   Social Determinants of Health   Financial Resource Strain: Not on file  Food Insecurity: Not on file  Transportation Needs: Not on file  Physical Activity: Not on file  Stress: Not on file  Social Connections: Not on file    Family History  Problem Relation Age of Onset   Hypertension Mother    Cancer Mother    Breast  cancer Maternal Aunt    Lung cancer Maternal Uncle    Cancer Other    Diabetes Daughter    Colon cancer Neg Hx     Outpatient Encounter Medications as of 01/14/2022  Medication Sig   ACCU-CHEK GUIDE test strip USE AS INSTRUCTED TO MONITOR GLUCOSE 4 TIMES DAILY BEFORE MEALS AND BEFORE BED.   Accu-Chek Softclix Lancets lancets Use as instructed to monitor glucose 4 times daily   albuterol (PROAIR HFA) 108 (90 Base) MCG/ACT inhaler Inhale 2 puffs into the lungs  every 6 (six) hours as needed for wheezing or shortness of breath.   aspirin EC 81 MG tablet Take 1 tablet (81 mg total) by mouth daily with breakfast. Swallow whole.   azithromycin (ZITHROMAX) 250 MG tablet Take 250 mg by mouth as directed.   benzonatate (TESSALON) 100 MG capsule Take 1 capsule (100 mg total) by mouth every 8 (eight) hours.   Blood Glucose Monitoring Suppl (ACCU-CHEK GUIDE) w/Device KIT 1 Piece by Does not apply route as directed.   Blood Pressure Monitoring (BLOOD PRESSURE CUFF) MISC See admin instructions.   buPROPion (WELLBUTRIN XL) 300 MG 24 hr tablet Take 300 mg by mouth every morning.   cloNIDine HCl (KAPVAY) 0.1 MG TB12 ER tablet 1 tablet at bedtime.   Continuous Blood Gluc Sensor (DEXCOM G6 SENSOR) MISC Apply new sensor every 10 days as directed   Continuous Blood Gluc Transmit (DEXCOM G6 TRANSMITTER) MISC 1 Device by Does not apply route every 3 (three) months.   cyclobenzaprine (FLEXERIL) 10 MG tablet Take 1 tablet (10 mg total) by mouth 3 (three) times daily as needed for muscle spasms.   Evolocumab (REPATHA SURECLICK) 320 MG/ML SOAJ Inject 1 Dose into the skin every 14 (fourteen) days.   fenofibrate (TRICOR) 145 MG tablet Take 1 tablet (145 mg total) by mouth daily.   FLUoxetine (PROZAC) 40 MG capsule Take 40 mg by mouth daily.   hydrOXYzine (ATARAX) 25 MG tablet Take 1 tablet (25 mg total) by mouth 3 (three) times daily as needed for itching or anxiety.   Insulin Pen Needle (PEN NEEDLES) 31G X 6 MM MISC Use  to inject insulin 4 times daily   INSULIN SYRINGE 1CC/29G 29G X 1/2" 1 ML MISC Use to inject insulin into Omnipod   linaclotide (LINZESS) 72 MCG capsule Take 1 capsule (72 mcg total) by mouth daily before breakfast.   pantoprazole (PROTONIX) 40 MG tablet Take 1 tablet (40 mg total) by mouth 2 (two) times daily before a meal.   rosuvastatin (CRESTOR) 40 MG tablet Take 1 tablet (40 mg total) by mouth daily.   sulfamethoxazole-trimethoprim (BACTRIM DS) 800-160 MG tablet Take 1 tablet by mouth 2 (two) times daily.   tamsulosin (FLOMAX) 0.4 MG CAPS capsule Take 1 capsule (0.4 mg total) by mouth at bedtime.   [DISCONTINUED] gabapentin (NEURONTIN) 300 MG capsule TAKE 1 CAPSULE(300 MG) BY MOUTH THREE TIMES DAILY (Patient taking differently: Take 300 mg by mouth 3 (three) times daily.)   [DISCONTINUED] insulin aspart (NOVOLOG) 100 UNIT/ML injection Use with Omnipod for TDD around 150 units daily   [DISCONTINUED] Insulin Disposable Pump (OMNIPOD DASH PODS, GEN 4,) MISC Change pod every 48 hrs as directed   FLUoxetine (PROZAC) 20 MG capsule Take 20 mg by mouth daily. (Patient not taking: Reported on 01/14/2022)   gabapentin (NEURONTIN) 400 MG capsule Take 1 capsule (400 mg total) by mouth 3 (three) times daily.   GLOBAL EASE INJECT PEN NEEDLES 31G X 5 MM MISC Inject into the skin. (Patient not taking: Reported on 01/14/2022)   HYDROcodone-acetaminophen (NORCO) 5-325 MG tablet Take 1 tablet by mouth every 6 (six) hours as needed for moderate pain. (Patient not taking: Reported on 01/14/2022)   HYDROcodone-acetaminophen (NORCO/VICODIN) 5-325 MG tablet Take 1 tablet by mouth every 6 (six) hours as needed. (Patient not taking: Reported on 01/14/2022)   HYDROmorphone (DILAUDID) 2 MG tablet Take 1 tablet (2 mg total) by mouth every 4 (four) hours as needed for severe pain. (Patient not taking: Reported on 01/14/2022)   insulin  aspart (NOVOLOG) 100 UNIT/ML injection Use with Omnipod for TDD around 150 units daily   Insulin  Disposable Pump (OMNIPOD DASH PODS, GEN 4,) MISC Change pod every 48 hrs as directed   nitroGLYCERIN (NITROSTAT) 0.4 MG SL tablet Place 1 tablet (0.4 mg total) under the tongue every 5 (five) minutes as needed for chest pain.   ondansetron (ZOFRAN-ODT) 4 MG disintegrating tablet Take 4 mg by mouth every 8 (eight) hours as needed. (Patient not taking: Reported on 01/14/2022)   promethazine (PHENERGAN) 12.5 MG tablet Take 1 tablet (12.5 mg total) by mouth every 8 (eight) hours as needed for nausea or vomiting. (Patient not taking: Reported on 01/14/2022)   promethazine (PHENERGAN) 12.5 MG tablet Take 1 tablet (12.5 mg total) by mouth every 6 (six) hours as needed for nausea or vomiting. (Patient not taking: Reported on 01/14/2022)   VYVANSE 50 MG capsule Take 50 mg by mouth daily. (Patient not taking: Reported on 01/14/2022)   Facility-Administered Encounter Medications as of 01/14/2022  Medication   betamethasone acetate-betamethasone sodium phosphate (CELESTONE) injection 3 mg    ALLERGIES: Allergies  Allergen Reactions   Ibuprofen Hives    itching itching itching   Benazepril Hives   Diphenhydramine Hives    Other reaction(s): hives Other reaction(s): hives   Latex Dermatitis    States she had redness to skin and itching    Metronidazole Nausea And Vomiting    Other reaction(s): nausea Other reaction(s): nausea   Oxycodone     Pt has tolerated hydromorphone in the past and takes Norco at home.   Toradol [Ketorolac Tromethamine] Itching   Zofran [Ondansetron Hcl] Nausea And Vomiting   Adhesive [Tape] Rash   Codeine Nausea And Vomiting and Rash    Other reaction(s): hives   Loratadine Rash   Nystatin-Triamcinolone Rash   Penicillins Rash    Has patient had a PCN reaction causing immediate rash, facial/tongue/throat swelling, SOB or lightheadedness with hypotension: No Has patient had a PCN reaction causing severe rash involving mucus membranes or skin necrosis: Yes Has patient had a PCN  reaction that required hospitalization: No Has patient had a PCN reaction occurring within the last 10 years: no  If all of the above answers are "NO", then may proceed with Cephalosporin use. Other reaction(s): nausea Received ancef w/o issue (7/23)    VACCINATION STATUS: Immunization History  Administered Date(s) Administered   Influenza,inj,Quad PF,6+ Mos 11/01/2015    Diabetes She presents for her follow-up diabetic visit. She has type 2 diabetes mellitus. Onset time: She was diagnosed at approximate age of 77 years with A1c of greater than 15% Her disease course has been improving. There are no hypoglycemic associated symptoms. Pertinent negatives for hypoglycemia include no confusion, headaches, pallor or seizures. Associated symptoms include foot paresthesias. Pertinent negatives for diabetes include no blurred vision, no chest pain, no fatigue, no polydipsia, no polyphagia and no polyuria. There are no hypoglycemic complications. Symptoms are stable. Diabetic complications include peripheral neuropathy. Risk factors for coronary artery disease include dyslipidemia, diabetes mellitus, hypertension, sedentary lifestyle and tobacco exposure. Current diabetic treatment includes insulin pump. She is compliant with treatment most of the time. Her weight is increasing steadily. She is following a generally unhealthy diet. When asked about meal planning, she reported none. She has not had a previous visit with a dietitian. She rarely participates in exercise. Her home blood glucose trend is decreasing steadily. Her overall blood glucose range is 140-180 mg/dl. (She presents today with her CGM showing at goal  glycemic profile overall.  Her POCT A1c today is 6.8%, improving from last visit of 7.5%.  Analysis of her CGM shows TIR 79%, TAR 18%, TBR 3% with a GMI of 6.7%.  She does have some hypoglycemia noted likely due to meal timing.  She notes she has a pinched nerve in her neck and they want her to  take prednisone for it which she has not due to the increase in her glucose that comes after.) An ACE inhibitor/angiotensin II receptor blocker is not being taken. She does not see a podiatrist.Eye exam is current.  Hyperlipidemia This is a chronic problem. The current episode started more than 1 year ago. The problem is uncontrolled. Recent lipid tests were reviewed and are high. Exacerbating diseases include diabetes. Factors aggravating her hyperlipidemia include beta blockers and fatty foods. Pertinent negatives include no chest pain, myalgias or shortness of breath. Current antihyperlipidemic treatment includes fibric acid derivatives and statins. The current treatment provides mild improvement of lipids. Compliance problems include adherence to diet and adherence to exercise.  Risk factors for coronary artery disease include diabetes mellitus, dyslipidemia, a sedentary lifestyle, family history and hypertension.  Hypertension This is a chronic problem. The current episode started more than 1 year ago. The problem is uncontrolled. Pertinent negatives include no blurred vision, chest pain, headaches, palpitations or shortness of breath. There are no associated agents to hypertension. Risk factors for coronary artery disease include dyslipidemia, diabetes mellitus and sedentary lifestyle. Past treatments include calcium channel blockers and beta blockers. The current treatment provides mild improvement. Compliance problems include diet and exercise.  Hypertensive end-organ damage includes CAD/MI.    Review of systems  Constitutional: + steadily increasing body weight,  current Body mass index is 36.4 kg/m. , no fatigue, no subjective hyperthermia, no subjective hypothermia Eyes: no blurry vision, no xerophthalmia ENT: no sore throat, no nodules palpated in throat, no dysphagia/odynophagia, no hoarseness Cardiovascular: no chest pain, no shortness of breath, no palpitations, no leg  swelling Respiratory: no cough, no shortness of breath Musculoskeletal: no muscle/joint aches Skin: no rashes, no hyperemia Neurological: no tremors, + numbness/tingling to BLE, no dizziness Psychiatric: no depression, no anxiety   Objective:    BP (!) 157/90 (BP Location: Left Arm, Patient Position: Sitting, Cuff Size: Large)   Pulse 89   Ht 5' (1.524 m)   Wt 186 lb 6.4 oz (84.6 kg)   BMI 36.40 kg/m   Wt Readings from Last 3 Encounters:  01/14/22 186 lb 6.4 oz (84.6 kg)  12/10/21 180 lb 12.4 oz (82 kg)  12/01/21 179 lb (81.2 kg)    BP Readings from Last 3 Encounters:  01/14/22 (!) 157/90  12/12/21 (!) 149/96  12/10/21 (!) 103/55     Physical Exam- Limited  Constitutional:  Body mass index is 36.4 kg/m. , not in acute distress, normal state of mind Eyes:  EOMI, no exophthalmos Musculoskeletal: no gross deformities, strength intact in all four extremities, no gross restriction of joint movements Skin:  no rashes, no hyperemia Neurological: no tremor with outstretched hands   Diabetic Foot Exam - Simple   No data filed    CMP ( most recent) CMP     Component Value Date/Time   NA 136 12/10/2021 0658   NA 136 12/20/2020 1208   K 4.4 12/10/2021 0658   CL 104 12/10/2021 0658   CO2 24 12/10/2021 0658   GLUCOSE 182 (H) 12/10/2021 0658   BUN 13 12/10/2021 0658   BUN 10 12/20/2020 1208  CREATININE 0.80 12/10/2021 0658   CALCIUM 8.9 12/10/2021 0658   PROT 7.2 12/10/2021 0658   PROT 6.6 12/18/2020 0834   ALBUMIN 3.4 (L) 12/10/2021 0658   ALBUMIN 4.1 12/18/2020 0834   AST 21 12/10/2021 0658   ALT 22 12/10/2021 0658   ALKPHOS 126 12/10/2021 0658   BILITOT 0.4 12/10/2021 0658   BILITOT 0.2 12/18/2020 0834   GFRNONAA >60 12/10/2021 0658   GFRAA >60 04/17/2019 1427     Diabetic Labs (most recent): Lab Results  Component Value Date   HGBA1C 6.8 (A) 01/14/2022   HGBA1C 7.5 (A) 10/14/2021   HGBA1C 8.1 06/24/2021   MICROALBUR 80 06/24/2021   MICROALBUR 80  07/10/2020      Assessment & Plan:   1) Uncontrolled type 2 diabetes mellitus with hyperglycemia ([place])  - Sandra [name] has currently uncontrolled symptomatic type 2 DM since  47 years of age.  She presents today with her CGM showing at goal glycemic profile overall.  Her POCT A1c today is 6.8%, improving from last visit of 7.5%.  Analysis of her CGM shows TIR 79%, TAR 18%, TBR 3% with a GMI of 6.7%.  She does have some hypoglycemia noted likely due to meal timing.  She notes she has a pinched nerve in her neck and they want her to take prednisone for it which she has not due to the increase in her glucose that comes after.  - I had a long discussion with her about the progressive nature of diabetes and the pathology behind its complications. -her diabetes is complicated by chronic smoking and she remains at a high risk for more acute and chronic complications which include CAD, CVA, CKD, retinopathy, and neuropathy. These are all discussed in detail with her.  - Nutritional counseling repeated at each appointment due to patients tendency to fall back in to old habits.  - The patient admits there is a room for improvement in their diet and drink choices. -  Suggestion is made for the patient to avoid simple carbohydrates from their diet including Cakes, Sweet Desserts / Pastries, Ice Cream, Soda (diet and regular), Sweet Tea, Candies, Chips, Cookies, Sweet Pastries, Store Bought Juices, Alcohol in Excess of 1-2 drinks a day, Artificial Sweeteners, Coffee Creamer, and "Sugar-free" Products. This will help patient to have stable blood glucose profile and potentially avoid unintended weight gain.   - I encouraged the patient to switch to unprocessed or minimally processed complex starch and increased protein intake (animal or plant source), fruits, and vegetables.   - Patient is advised to stick to a routine mealtimes to eat 3 meals a day and avoid unnecessary snacks (to snack only to correct  hypoglycemia).  - she will be scheduled with Jearld Fenton, RDN, CDE for diabetes education.  - I have approached her with the following individualized plan to manage  her diabetes and patient agrees:   -Given her at goal glycemic profile, no changes will be made to her pump settings.  -She is encouraged to continue monitoring blood glucose 4 times daily (using her CGM), before meals and at bedtime, and to call the clinic if she has readings less than 70 or greater than 300 for 3 readings in a row.   - Specific targets for  A1c;  LDL, HDL, Triglycerides, were discussed with the patient.  2) Blood Pressure /Hypertension: Her blood pressure is not controlled to target. It does not appear she is on any antihypertensive medications.  She is allergic to ACE  inhibitors. She has a history of being a chronic heavy smoker, quit in 2021.  Marland Kitchen   3) Lipids/Hyperlipidemia:  Her most recent lipid panel from 07/20/21 shows controlled LDL of 99 and elevated triglycerides of 245.  She is advised to continue her Crestor 40 mg po daily at bedtime, Repatha 140 mg po every 14 days, and Tricor 145 mg po daily.  Side effects and precautions discussed with her.    4)  Weight/Diet:  Her Body mass index is 36.4 kg/m.-clearly complicating her diabetes care.  She is a candidate for modest weight loss.  I discussed with her the fact that loss of 5 - 10% of her  current body weight will have the most impact on her diabetes management.  Exercise, and detailed carbohydrates information provided  -  detailed on discharge instructions.  5) Chronic Care/Health Maintenance: -she is not on ACEI/ARB and is on Statin medications and is encouraged to initiate and continue to follow up with Ophthalmology, Dentist,  Podiatrist at least yearly or according to recommendations, and advised to stay away from smoking. I have recommended yearly flu vaccine and pneumonia vaccine at least every 5 years; moderate intensity exercise for up to 150  minutes weekly; and  sleep for at least 7 hours a day.  - she is advised to maintain close follow up with Coolidge Breeze, FNP for primary care needs, as well as her other providers for optimal and coordinated care.  -I did increase her Gabapentin to 400 mg po TID for her diabetic nerve pain.    I spent 33 minutes in the care of the patient today including review of labs from [place], Lipids, Thyroid Function, Hematology (current and previous including abstractions from other facilities); face-to-face time discussing  her blood glucose readings/logs, discussing hypoglycemia and hyperglycemia episodes and symptoms, medications doses, her options of short and long term treatment based on the latest standards of care / guidelines;  discussion about incorporating lifestyle medicine;  and documenting the encounter. Risk reduction counseling performed per USPSTF guidelines to reduce obesity and cardiovascular risk factors.     Please refer to Patient Instructions for Blood Glucose Monitoring and Insulin/Medications Dosing Guide"  in media tab for additional information. Please  also refer to " Patient Self Inventory" in the Media  tab for reviewed elements of pertinent patient history.  Sandra [name] participated in the discussions, expressed understanding, and voiced agreement with the above plans.  All questions were answered to her satisfaction. she is encouraged to contact clinic should she have any questions or concerns prior to her return visit.   Follow up plan: - Return in about 4 months (around 05/15/2022) for Diabetes F/U with A1c in office, No previsit labs, Bring meter and logs.   Rayetta Pigg, [facility] [address] [place], [place] [address] Phone: [phone] Fax: [phone]  01/14/2022, 4:23 PM

## 2022-01-16 ENCOUNTER — Other Ambulatory Visit (HOSPITAL_COMMUNITY): Payer: Self-pay | Admitting: Family Medicine

## 2022-01-16 DIAGNOSIS — M542 Cervicalgia: Secondary | ICD-10-CM

## 2022-01-16 DIAGNOSIS — M545 Low back pain, unspecified: Secondary | ICD-10-CM

## 2022-01-21 ENCOUNTER — Ambulatory Visit (HOSPITAL_COMMUNITY)
Admission: RE | Admit: 2022-01-21 | Discharge: 2022-01-21 | Disposition: A | Discharge status: Home / Self Care | Payer: Medicaid Other | Source: Ambulatory Visit | Attending: Family Medicine | Admitting: Family Medicine

## 2022-01-21 DIAGNOSIS — M545 Low back pain, unspecified: Secondary | ICD-10-CM | POA: Diagnosis present

## 2022-01-21 DIAGNOSIS — M542 Cervicalgia: Secondary | ICD-10-CM | POA: Insufficient documentation

## 2022-01-27 ENCOUNTER — Ambulatory Visit: Payer: Medicaid Other | Admitting: Urology

## 2022-01-30 DIAGNOSIS — M4712 Other spondylosis with myelopathy, cervical region: Secondary | ICD-10-CM | POA: Insufficient documentation

## 2022-02-04 ENCOUNTER — Telehealth: Payer: Self-pay | Admitting: *Deleted

## 2022-02-04 NOTE — Telephone Encounter (Signed)
   Pre-operative Risk Assessment    Patient Name: Sandra [name]  DOB: 1975-09-03 MRN: [id number]      Request for Surgical Clearance    Procedure:   TDR C6-7  Date of Surgery:  Clearance TBD                                 Surgeon:  DR. [facility] BROOKS Surgeon's Group or Practice Name:  [facility] Phone number:  [phone] Fax number:  [phone] ATTN: Orson Slick   Type of Clearance Requested:   - Medical ; ASA    Type of Anesthesia:  General    Additional requests/questions:    Jiles Prows   02/04/2022, 1:45 PM

## 2022-02-04 NOTE — Telephone Encounter (Signed)
   Name: ROBINA HAMOR  DOB: 1975-11-13  MRN: [id number]  Primary Cardiologist: Jenkins Rouge, MD   Preoperative team, please contact this patient and set up a phone call appointment for further preoperative risk assessment. Please obtain consent and complete medication review. Thank you for your help.  I confirm that guidance regarding antiplatelet and oral anticoagulation therapy has been completed and, if necessary, noted below.  Per office protocol, if patient is without any new symptoms or concerns at the time of their virtual visit, she may hold Aspirin for 5-7 days prior to procedure. Please resume Aspirin as soon as possible postprocedure, at the discretion of the surgeon.     Lenna Sciara, NP 02/04/2022, 4:30 PM [place]

## 2022-02-05 ENCOUNTER — Telehealth: Payer: Self-pay | Admitting: *Deleted

## 2022-02-05 NOTE — Telephone Encounter (Signed)
Pt is calling to get update on clearance. Requesting return call.

## 2022-02-05 NOTE — Telephone Encounter (Signed)
  Patient Consent for Virtual Visit         Sandra [name] has provided verbal consent on 02/05/2022 for a virtual visit (video or telephone).   CONSENT FOR VIRTUAL VISIT FOR:  Sandra [name]  By participating in this virtual visit I agree to the following:  I hereby voluntarily request, consent and authorize [facility] and its employed or contracted physicians, physician assistants, nurse practitioners or other licensed health care professionals (the Practitioner), to provide me with telemedicine health care services (the "Services") as deemed necessary by the treating Practitioner. I acknowledge and consent to receive the Services by the Practitioner via telemedicine. I understand that the telemedicine visit will involve communicating with the Practitioner through live audiovisual communication technology and the disclosure of certain medical information by electronic transmission. I acknowledge that I have been given the opportunity to request an in-person assessment or other available alternative prior to the telemedicine visit and am voluntarily participating in the telemedicine visit.  I understand that I have the right to withhold or withdraw my consent to the use of telemedicine in the course of my care at any time, without affecting my right to future care or treatment, and that the Practitioner or I may terminate the telemedicine visit at any time. I understand that I have the right to inspect all information obtained and/or recorded in the course of the telemedicine visit and may receive copies of available information for a reasonable fee.  I understand that some of the potential risks of receiving the Services via telemedicine include:  Delay or interruption in medical evaluation due to technological equipment failure or disruption; Information transmitted may not be sufficient (e.g. poor resolution of images) to allow for appropriate medical decision making by the  Practitioner; and/or  In rare instances, security protocols could fail, causing a breach of personal health information.  Furthermore, I acknowledge that it is my responsibility to provide information about my medical history, conditions and care that is complete and accurate to the best of my ability. I acknowledge that Practitioner's advice, recommendations, and/or decision may be based on factors not within their control, such as incomplete or inaccurate data provided by me or distortions of diagnostic images or specimens that may result from electronic transmissions. I understand that the practice of medicine is not an exact science and that Practitioner makes no warranties or guarantees regarding treatment outcomes. I acknowledge that a copy of this consent can be made available to me via my patient portal ([facility]), or I can request a printed copy by calling the office of [facility].    I understand that my insurance will be billed for this visit.   I have read or had this consent read to me. I understand the contents of this consent, which adequately explains the benefits and risks of the Services being provided via telemedicine.  I have been provided ample opportunity to ask questions regarding this consent and the Services and have had my questions answered to my satisfaction. I give my informed consent for the services to be provided through the use of telemedicine in my medical care

## 2022-02-06 ENCOUNTER — Telehealth: Payer: Self-pay | Admitting: Nurse Practitioner

## 2022-02-06 ENCOUNTER — Ambulatory Visit: Payer: Medicaid Other | Attending: Cardiology | Admitting: Physician Assistant

## 2022-02-06 DIAGNOSIS — Z01810 Encounter for preprocedural cardiovascular examination: Secondary | ICD-10-CM

## 2022-02-06 NOTE — Telephone Encounter (Signed)
New message    Surgery clearance form coming over from Dr. Jonn Shingles.   Asking for a call back aware that office closed at noon -- expected a call back on Monday

## 2022-02-06 NOTE — Progress Notes (Signed)
Virtual Visit via Telephone Note   Because of Sandra [name]'s co-morbid illnesses, she is at least at moderate risk for complications without adequate follow up.  This format is felt to be most appropriate for this patient at this time.  The patient did not have access to video technology/had technical difficulties with video requiring transitioning to audio format only (telephone).  All issues noted in this document were discussed and addressed.  No physical exam could be performed with this format.  Please refer to the patient's chart for her consent to telehealth for [facility]  Evaluation Performed:  Preoperative cardiovascular risk assessment _____________   Date:  02/06/2022   Patient ID:  Sandra [name], DOB 03-May-1975, MRN [id number] Patient Location:  Home Provider location:   Office  Primary Care Provider:  Coolidge Breeze, FNP Primary Cardiologist:  Jenkins Rouge, MD  Chief Complaint / Patient Profile   47 y.o. y/o female with a h/o coronary artery disease, hypertension, hyperlipidemia, diabetes mellitus, ADHD, bipolar disorder, bladder cancer, GERD who is pending TDR C6-7 and presents today for telephonic preoperative cardiovascular risk assessment.  History of Present Illness    Sandra [name] is a 47 y.o. female who presents via audio/video conferencing for a telehealth visit today.  Pt was last seen in cardiology clinic on 08/28/21 by Dr. Johnsie Cancel.  At that time Sandra [name] was doing well.  The patient is now pending procedure as outlined above. Since her last visit, she tells me that she has not had any issues with her heart and been doing good.  She has not had any chest pain or shortness of breath.  She states that her walking is limited due to her foot being broken in 7 places.  Her fingers also are numb and that is why she needs to have her surgery done.  She has had a rough couple of months and her mother is actually in the hospital now at [facility] and  had 2 heart attacks.  She has an appointment with Dr. Asa Lente next month and I suggested that she keep that appointment even though her surgery may be scheduled around that time.  Per office protocol,  she may hold Aspirin for 5-7 days prior to procedure. Please resume Aspirin as soon as possible postprocedure, at the discretion of the surgeon.     Past Medical History    Past Medical History:  Diagnosis Date   ADHD (attention deficit hyperactivity disorder)    Allergic rhinitis    Anemia    hx of   Barrett's esophagus    Bipolar 1 disorder (HCC)    Bladder tumor    CAD (coronary artery disease)    Cancer ([place])    bladder   Constipation    Dilated cardiomyopathy ([place]) 12/20/2020   Echo 6/22 ([facility]): EF 43, mild MR, mild TR // Echo 3/22: EF 50-55   Dizziness    Full dentures    GERD (gastroesophageal reflux disease)    Gross hematuria    Hyperlipidemia    Hypertension    Stable angina    Type 2 diabetes mellitus ([place])    Urgency of urination    dysuria, sui   Wears glasses    Past Surgical History:  Procedure Laterality Date   BALLOON DILATION N/A 02/17/2021   Procedure: BALLOON DILATION;  Surgeon: Eloise Harman, DO;  [facility];  Service: Endoscopy;  Laterality: N/A;   BIOPSY  02/17/2021   Procedure:  BIOPSY;  Surgeon: Eloise Harman, DO;  [facility];  Service: Endoscopy;;   COLONOSCOPY WITH PROPOFOL N/A 02/17/2021   Surgeon: Eloise Harman, DO; nonbleeding internal hemorrhoids, otherwise normal exam.  Repeat in 10 years.   CYSTOSCOPY N/A 10/03/2015   Procedure: CYSTOSCOPY;  Surgeon: Irine Seal, MD;  Location: [facility];  Service: Urology;  Laterality: N/A;   CYSTOSCOPY W/ URETERAL STENT PLACEMENT Right 07/24/2021   Procedure: CYSTOSCOPY WITH RETROGRADE PYELOGRAM/URETERAL STENT PLACEMENT;  Surgeon: Cleon Gustin, MD;  Location: [facility];  Service: Urology;  Laterality: Right;   CYSTOSCOPY WITH RETROGRADE PYELOGRAM,  URETEROSCOPY AND STENT PLACEMENT Right 08/14/2021   Procedure: CYSTOSCOPY WITH RETROGRADE PYELOGRAM, URETEROSCOPY AND STENT EXCHANGE;  Surgeon: Cleon Gustin, MD;  [facility];  Service: Urology;  Laterality: Right;   CYSTOSCOPY WITH STENT PLACEMENT Right 10/31/2015   Procedure: CYSTOSCOPY WITH ATTEMPTED RIGHT URETERAL OPENING;  Surgeon: Irine Seal, MD;  Location[facility];  Service: Urology;  Laterality: Right;   ESOPHAGOGASTRODUODENOSCOPY (EGD) WITH PROPOFOL N/A 02/17/2021   Surgeon: Eloise Harman, DO; 2 cm hiatal hernia, Barrett's esophagus, gastritis with biopsies negative for H. pylori.  S/p empiric esophageal dilation.  Repeat in 3 years.   FOOT SURGERY Left    September 2022, broke 7 bones in left foot   INTERSTIM IMPLANT PLACEMENT N/A 03/14/2018   Procedure: Barrie Lyme IMPLANT FIRST STAGE;  Surgeon: Cleon Gustin, MD;  [facility];  Service: Urology;  Laterality: N/A;   INTERSTIM IMPLANT PLACEMENT N/A 03/30/2018   Procedure: Barrie Lyme IMPLANT SECOND STAGE;  Surgeon: Cleon Gustin, MD;  [facility];  Service: Urology;  Laterality: N/A;   INTERSTIM IMPLANT REVISION N/A 12/01/2021   Procedure: REVISION OF Barrie Lyme- generator anf lead replacement;  Surgeon: Cleon Gustin, MD;  [facility];  Service: Urology;  Laterality: N/A;   LEFT HEART CATH AND CORONARY ANGIOGRAPHY N/A 05/31/2020   Procedure: LEFT HEART CATH AND CORONARY ANGIOGRAPHY;  Surgeon: Troy Sine, MD;  Location: [facility];  Service: Cardiovascular;  Laterality: N/A;   MULTIPLE TOOTH EXTRACTIONS  2015   OVARIAN CYST REMOVAL Right 2004 approx   SHOULDER OPEN ROTATOR CUFF REPAIR Left 04/20/2019   Procedure: ROTATOR CUFF REPAIR SHOULDER OPEN;  Surgeon: Carole Civil, MD;  Location[facility];  Service: Orthopedics;  Laterality: Left;   TONSILLECTOMY  12/30/2004   TOTAL ABDOMINAL HYSTERECTOMY W/ BILATERAL SALPINGOOPHORECTOMY  05/16/2004   TRANSURETHRAL  RESECTION OF BLADDER TUMOR N/A 10/03/2015   Procedure: TRANSURETHRAL RESECTION OF BLADDER TUMOR (TURBT);  Surgeon: Irine Seal, MD;  Location: [facility];  Service: Urology;  Laterality: N/A;   TRANSURETHRAL RESECTION OF BLADDER TUMOR N/A 10/31/2015   Procedure: RE-STAGINGG TRANSURETHRAL RESECTION OF BLADDER TUMOR (TURBT);  Surgeon: Irine Seal, MD;  Location: [facility];  Service: Urology;  Laterality: N/A;   TRANSURETHRAL RESECTION OF BLADDER TUMOR N/A 08/17/2016   Procedure: TRANSURETHRAL RESECTION OF BLADDER TUMOR (TURBT);  Surgeon: Cleon Gustin, MD;  [facility];  Service: Urology;  Laterality: N/A;   URETERAL BIOPSY Right 08/14/2021   Procedure: URETERAL BIOPSY;  Surgeon: Cleon Gustin, MD;  [facility];  Service: Urology;  Laterality: Right;    Allergies  Allergies  Allergen Reactions   Ibuprofen Hives    itching itching itching   Benazepril Hives   Diphenhydramine Hives    Other reaction(s): hives Other reaction(s): hives   Latex Dermatitis    States she had  redness to skin and itching    Metronidazole Nausea And Vomiting    Other reaction(s): nausea Other reaction(s): nausea   Oxycodone     Pt has tolerated hydromorphone in the past and takes Norco at home.   Toradol [Ketorolac Tromethamine] Itching   Zofran [Ondansetron Hcl] Hives   Adhesive [Tape] Rash   Codeine Nausea And Vomiting and Rash    Other reaction(s): hives   Loratadine Rash   Nystatin-Triamcinolone Rash   Penicillins Rash    Has patient had a PCN reaction causing immediate rash, facial/tongue/throat swelling, SOB or lightheadedness with hypotension: No Has patient had a PCN reaction causing severe rash involving mucus membranes or skin necrosis: Yes Has patient had a PCN reaction that required hospitalization: No Has patient had a PCN reaction occurring within the last 10 years: no  If all of the above answers are "NO", then may proceed with  Cephalosporin use. Other reaction(s): nausea Received ancef w/o issue (7/23)    Home Medications    Prior to Admission medications   Medication Sig Start Date End Date Taking? Authorizing Provider  ACCU-CHEK GUIDE test strip USE AS INSTRUCTED TO MONITOR GLUCOSE 4 TIMES DAILY BEFORE MEALS AND BEFORE BED. 04/29/21   Brita Romp, NP  Accu-Chek Softclix Lancets lancets Use as instructed to monitor glucose 4 times daily 07/10/20   Brita Romp, NP  albuterol (PROAIR HFA) 108 (90 Base) MCG/ACT inhaler Inhale 2 puffs into the lungs every 6 (six) hours as needed for wheezing or shortness of breath. 07/24/21   Roxan Hockey, MD  aspirin EC 81 MG tablet Take 1 tablet (81 mg total) by mouth daily with breakfast. Swallow whole. 07/24/21   Roxan Hockey, MD  benzonatate (TESSALON) 100 MG capsule Take 1 capsule (100 mg total) by mouth every 8 (eight) hours. 11/24/21   Blue, Soijett A, PA-C  Blood Glucose Monitoring Suppl (ACCU-CHEK GUIDE) w/Device KIT 1 Piece by Does not apply route as directed. 04/15/20   Brita Romp, NP  Blood Pressure Monitoring (BLOOD PRESSURE CUFF) MISC See admin instructions.    [provider]  buPROPion (WELLBUTRIN XL) 300 MG 24 hr tablet Take 300 mg by mouth every morning. 01/18/20   [provider]  cloNIDine HCl (KAPVAY) 0.1 MG TB12 ER tablet 1 tablet at bedtime.    [provider]  Continuous Blood Gluc Sensor (DEXCOM G6 SENSOR) MISC Apply new sensor every 10 days as directed 10/14/21   Brita Romp, NP  Continuous Blood Gluc Transmit (DEXCOM G6 TRANSMITTER) MISC 1 Device by Does not apply route every 3 (three) months. 10/14/21   Brita Romp, NP  cyclobenzaprine (FLEXERIL) 10 MG tablet Take 1 tablet (10 mg total) by mouth 3 (three) times daily as needed for muscle spasms. 09/12/21   McKenzie, Candee Furbish, MD  Evolocumab (REPATHA SURECLICK) 950 MG/ML SOAJ Inject 1 Dose into the skin every 14 (fourteen) days. 06/24/21   Hilty,  Nadean Corwin, MD  fenofibrate (TRICOR) 145 MG tablet Take 1 tablet (145 mg total) by mouth daily. 06/24/21   Hilty, Nadean Corwin, MD  FLUoxetine (PROZAC) 20 MG capsule Take 20 mg by mouth daily. 10/24/21   [provider]  FLUoxetine (PROZAC) 40 MG capsule Take 40 mg by mouth daily. 04/02/20   [provider]  GLOBAL EASE INJECT PEN NEEDLES 31G X 5 MM MISC Inject into the skin. 03/13/21   [provider]  HYDROcodone-acetaminophen (NORCO) 5-325 MG tablet Take 1 tablet by mouth every 6 (  six) hours as needed for moderate pain. 12/01/21   McKenzie, Candee Furbish, MD  hydrOXYzine (ATARAX) 25 MG tablet Take 1 tablet (25 mg total) by mouth 3 (three) times daily as needed for itching or anxiety. 07/24/21   Roxan Hockey, MD  insulin aspart (NOVOLOG) 100 UNIT/ML injection Use with Omnipod for TDD around 150 units daily 01/14/22   Brita Romp, NP  Insulin Disposable Pump (OMNIPOD DASH PODS, GEN 4,) MISC Change pod every 48 hrs as directed 01/14/22   Brita Romp, NP  Insulin Pen Needle (PEN NEEDLES) 31G X 6 MM MISC Use to inject insulin 4 times daily 02/24/21   Brita Romp, NP  INSULIN SYRINGE 1CC/29G 29G X 1/2" 1 ML MISC Use to inject insulin into Omnipod 08/05/21   Brita Romp, NP  linaclotide (LINZESS) 72 MCG capsule Take 1 capsule (72 mcg total) by mouth daily before breakfast. 06/18/21   Erenest Rasher, PA-C  nitroGLYCERIN (NITROSTAT) 0.4 MG SL tablet Place 1 tablet (0.4 mg total) under the tongue every 5 (five) minutes as needed for chest pain. 12/20/20 02/05/22  Richardson Dopp T, PA-C  pantoprazole (PROTONIX) 40 MG tablet Take 1 tablet (40 mg total) by mouth 2 (two) times daily before a meal. 06/18/21   Jodi Mourning, Tivis Ringer, PA-C  promethazine (PHENERGAN) 12.5 MG tablet Take 1 tablet (12.5 mg total) by mouth every 8 (eight) hours as needed for nausea or vomiting. 10/14/21   Brita Romp, NP  rosuvastatin (CRESTOR) 40 MG tablet Take 1 tablet (40 mg total) by mouth daily.  06/24/21   Hilty, Nadean Corwin, MD  tamsulosin (FLOMAX) 0.4 MG CAPS capsule Take 1 capsule (0.4 mg total) by mouth at bedtime. 12/12/21   Summerlin, Cristal Ford Annette, PA-C  VYVANSE 50 MG capsule Take 50 mg by mouth daily. 11/03/21   [provider]    Physical Exam    Vital Signs:  Sandra [name] does not have vital signs available for review today.  Given telephonic nature of communication, physical exam is limited. AAOx3. NAD. Normal affect.  Speech and respirations are unlabored.  Accessory Clinical Findings    None  Assessment & Plan    1.  Preoperative Cardiovascular Risk Assessment:  Ms. Theisen perioperative risk of a major cardiac event is 6.6% according to the Revised Cardiac Risk Index (RCRI).  Therefore, she is at high risk for perioperative complications.   Her functional capacity is fair at 4.73 METs according to the [facility] Activity Status Index (DASI). Recommendations: According to ACC/AHA guidelines, no further cardiovascular testing needed.  The patient may proceed to surgery at acceptable risk.   Antiplatelet and/or Anticoagulation Recommendations: Aspirin can be held for 5-7 days prior to her surgery.  Please resume Aspirin post operatively when it is felt to be safe from a bleeding standpoint.   A copy of this note will be routed to requesting surgeon.  Time:   Today, I have spent 10 minutes with the patient with telehealth technology discussing medical history, symptoms, and management plan.     Elgie Collard, PA-C  02/06/2022, 11:53 AM

## 2022-02-12 ENCOUNTER — Ambulatory Visit (HOSPITAL_COMMUNITY): Payer: Self-pay | Admitting: Orthopedic Surgery

## 2022-02-16 ENCOUNTER — Other Ambulatory Visit: Payer: Self-pay | Admitting: Physician Assistant

## 2022-02-16 ENCOUNTER — Ambulatory Visit: Payer: Medicaid Other | Admitting: Podiatry

## 2022-02-23 NOTE — Progress Notes (Deleted)
Cardiology Office Note:    Date:  02/23/2022   ID:  Sandra [name], DOB April 08, 1975, MRN [id number]  PCP:  Coolidge Breeze, [facility] Cardiologist:  Jenkins Rouge, MD     Referring MD: Coolidge Breeze, FNP   Chief Complaint:  No chief complaint on file.     Patient Profile:   Sandra [name] is a 47 y.o. female with:  Coronary artery disease  Cath 5/22:  diffuse small vessel disease (diabetic); p-mRCA 70; occluded PDA w/ L-R collats; OM2 60 >> med Rx Myoview 6/22 ([facility]): no ischemia, EF 34 Myoview 2/22: no ischemia, EF 45, ?inf infarct  Echocardiogram 3/22: inf HK-AK, EF 50-55 Unable to get cMRI due to interstim implant  Hypertension  Hyperlipidemia  Diabetes mellitus  ADHD Bipolar d/o Bladder CA S/p interstim implant  GERD  Husband died due to COVID-19 in October 01, 2022 Allergic to ACEi (urticaria with benazepril)   History of Present Illness: Sandra [name]  has small vessel diabetic disease with mod prox to mid RCA, OM2 disease and a chronically occluded PDA and L-R collaterals.  Her chest pain is managed medically.    Seen in [place] ER 02/07/21 She was concerned about aortic aneurysm but review of scan only shows mild aortic ectasia 3.9 cm    Sees [facility] for familial HLD. On repatha crestor and fibrates BS better controlled Smoking    Hospitalized July 2023 with intractable nausea Had right hydronephrosis with stenting by Dr Alyson Ingles 08/14/21    She adopted 2 daughters in past One died and she has her 61 yo grandson Sandra [name] living with her Other daughter with her at home as well  Needs C6-7 spinal surgery with Dr Rolena Infante   ***   Prior CV studies:  NM Myocar Multi W/Spect W/Wall Motion / EF 03/07/2020 No large ischemia, EF 45, low risk  Myoview 06/21/20 ([facility]) No evidence of inducible myocardial ischemia or myocardial infarction. LVEF 34%.    Echocardiogram 06/19/20 ([facility]) EF 43, mild LVH, mild RV systolic dysfunction, mild MR, mild TR, trivial  TR  Echocardiogram June 2023  Aorta 3.7 cm EF 50-55% improved    LEFT HEART CATH AND CORONARY ANGIOGRAPHY 05/31/2020 RCA proximal 30, mid 70, distal 50; RPDA 100 LCx proximal 40, mid 20; OM1 30, OM2 60 LAD proximal 20, mid 5 RI 30 you to thank you       Past Medical History:  Diagnosis Date   ADHD (attention deficit hyperactivity disorder)    Allergic rhinitis    Anemia    hx of   Barrett's esophagus    Bipolar 1 disorder (HCC)    Bladder tumor    CAD (coronary artery disease)    Cancer ([place])    bladder   Constipation    Dilated cardiomyopathy ([place]) 12/20/2020   Echo 6/22 ([facility]): EF 43, mild MR, mild TR // Echo 3/22: EF 50-55   Dizziness    Full dentures    GERD (gastroesophageal reflux disease)    Gross hematuria    Hyperlipidemia    Hypertension    Stable angina    Type 2 diabetes mellitus (HCC)    Urgency of urination    dysuria, sui   Wears glasses    Current Medications: No outpatient medications have been marked as taking for the 03/09/22 encounter (Appointment) with Josue Hector, MD.   Current Facility-Administered Medications for the 03/09/22 encounter (Appointment) with Josue Hector, MD  Medication   betamethasone acetate-betamethasone sodium phosphate (  CELESTONE) injection 3 mg    Allergies:   Ibuprofen, Benazepril, Diphenhydramine, Latex, Metronidazole, Oxycodone, Toradol [ketorolac tromethamine], Zofran [ondansetron hcl], Adhesive [tape], Codeine, Loratadine, Nystatin-triamcinolone, and Penicillins   Social History   Tobacco Use   Smoking status: Former    Packs/day: 0.50    Years: 10.00    Total pack years: 5.00    Types: Cigarettes    Quit date: 01/31/2021    Years since quitting: 1.0   Smokeless tobacco: Never   Tobacco comments:    02/06/21-no smoking past 5 days  Vaping Use   Vaping Use: Never used  Substance Use Topics   Alcohol use: No   Drug use: No    Family Hx: The patient's family history includes Breast cancer in her  maternal aunt; Cancer in her mother and another family member; Diabetes in her daughter; Hypertension in her mother; Lung cancer in her maternal uncle. There is no history of Colon cancer.  ROS see HPI  EKGs/Labs/Other Test Reviewed:    EKG:  02/23/2022 NSR, HR 74, normal axis, no ST-T wave changes, QTC 475  Recent Labs: 07/21/2021: Magnesium 1.9 12/10/2021: ALT 22; BUN 13; Creatinine, Ser 0.80; Hemoglobin 11.7; Platelets 434; Potassium 4.4; Sodium 136   Recent Lipid Panel Lab Results  Component Value Date/Time   CHOL 175 07/20/2021 07:59 AM   CHOL 246 (H) 06/05/2020 08:53 AM   TRIG 245 (H) 07/20/2021 07:59 AM   HDL 27 (L) 07/20/2021 07:59 AM   HDL 31 (L) 06/05/2020 08:53 AM   LDLCALC 99 07/20/2021 07:59 AM   LDLCALC 163 (H) 06/05/2020 08:53 AM     Physical Exam:    VS:  There were no vitals taken for this visit.    Wt Readings from Last 3 Encounters:  01/14/22 186 lb 6.4 oz (84.6 kg)  12/10/21 180 lb 12.4 oz (82 kg)  12/01/21 179 lb (81.2 kg)    Affect appropriate Healthy:  appears stated age 55: normal xanthelasma  Neck supple with no adenopathy JVP normal no bruits no thyromegaly Lungs COPD with mild exp wheezing  Heart:  S1/S2 no murmur, no rub, gallop or click PMI normal Abdomen: benighn, BS positve, no tenderness, no AAA no bruit.  No HSM or HJR Distal pulses intact with no bruits No edema Neuro non-focal Skin warm and dry Xanthelasma over eyes  No muscular weakness   Plan:  Familial hypercholesterolemia She currently remains on rosuvastatin 40 mg daily, fenofibrate 145 mg daily, evolocumab 140 mg every 14 days.  Imporoved with LDL particle # 1216 and LDL 99 F/U Dr Julieanne Manson    CAD (coronary artery disease) Cardiac catheterization in 5/22 with diffuse small vessel diabetic disease.  She has moderate disease in the proximal to mid RCA and occluded PDA with left-to-right collaterals as well as moderate disease in OM 2.  She has been managed medically.   Metoprolol succinate  100 mg daily.  Continue amlodipine 5 mg daily, isosorbide mononitrate 60 mg daily, rosuvastatin 40 mg daily, aspirin 81 mg daily.     Essential hypertension, benign Blood pressure is well controlled on her current medical regimen which includes amlodipine, clonidine, isosorbide mononitrate, metoprolol succinate.   Dilated cardiomyopathy ([place]) EF Improved by TTE 06/17/21 to 50-55% She is NYHA II.  Volume status currently stable.  Metoprolol succinate to 100 mg daily  Continue isosorbide mononitrate 60 mg daily.    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 Atypical pneumonia post antibiotic RX Non contrast CT with ? Post inflammatory nodule largest 1.4 cm in RUL CTA 12/10/21 with nodular alveolar density LLL ? Pneumonia  Needs to f/u with pulmonary /primary   Preoperative:  Needs C67 surgery with Dr Rolena Infante No angina low normal EF can do 5 mets Has collaterals and diffuse dx not amenable to stenting Continue current antianginals Ok to hold ASA and proceed with surgery     Medication Adjustments/Labs and Tests Ordered: Current medicines are reviewed at length with the patient today.  Concerns regarding medicines are outlined above.  Tests Ordered:  None  Medication Changes: No orders of the defined types were placed in this encounter.  F/U [facility] 3 months with lipid/liver F/U Cardiology 6 months   Signed, Jenkins Rouge, MD  02/23/2022 1:55 PM    [facility] [address], [place], [place]  [address] Phone: [phone]; Fax: [phone]

## 2022-02-24 NOTE — Pre-Procedure Instructions (Signed)
Surgical Instructions    Your procedure is scheduled on Thursday 03/05/22.   Report to [facility] Main Entrance "A" at 12:00 Noon, then check in with the Admitting office.  Call this number if you have problems the morning of surgery:  [phone]   If you have any questions prior to your surgery date call [phone]: Open Monday-Friday 8am-4pm If you experience any cold or flu symptoms such as cough, fever, chills, shortness of breath, etc. between now and your scheduled surgery, please notify us at the above number     Remember:  Do not eat after midnight the night before your surgery  You may drink clear liquids until 11:00 A.M. the morning of your surgery.   Clear liquids allowed are: Water, Non-Citrus Juices (without pulp), Carbonated Beverages, Clear Tea, Black Coffee ONLY (NO MILK, CREAM OR POWDERED CREAMER of any kind), and Gatorade    Take these medicines the morning of surgery with A SIP OF WATER:   buPROPion (WELLBUTRIN XL)   fenofibrate (TRICOR)   FLUoxetine (PROZAC)   linaclotide (LINZESS)   pantoprazole (PROTONIX)   rosuvastatin (CRESTOR)   VYVANSE     Take these medicines if needed:   albuterol (PROAIR HFA)   benzonatate (TESSALON)   cyclobenzaprine (FLEXERIL)   HYDROcodone-acetaminophen (NORCO)   hydrOXYzine (ATARAX)   promethazine (PHENERGAN)   As of today, STOP taking any Aspirin (unless otherwise instructed by your surgeon) Aleve, Naproxen, Ibuprofen, Motrin, Advil, Goody's, BC's, all herbal medications, fish oil, and all vitamins.  WHAT DO I DO ABOUT MY DIABETES MEDICATION?   Do not take oral diabetes medicines (pills) the morning of surgery.  The day of surgery, do not take other diabetes injectables, including Byetta (exenatide), Bydureon (exenatide ER), Victoza (liraglutide), or Trulicity (dulaglutide).  Please contact your PCP or Endocrinologist for instructions regarding your insulin pump. If you do not receive instructions then please reduce  all basal rates by 20% at midnight the night before your surgery.   The day of surgery, keep all basal rates at reduced rate 20% or lower.   If your CBG is greater than 220 mg/dL, you may take  of your sliding scale (correction) dose of insulin.   HOW TO MANAGE YOUR DIABETES BEFORE AND AFTER SURGERY  Why is it important to control my blood sugar before and after surgery? Improving blood sugar levels before and after surgery helps healing and can limit problems. A way of improving blood sugar control is eating a healthy diet by:  Eating less sugar and carbohydrates  Increasing activity/exercise  Talking with your doctor about reaching your blood sugar goals High blood sugars (greater than 180 mg/dL) can raise your risk of infections and slow your recovery, so you will need to focus on controlling your diabetes during the weeks before surgery. Make sure that the doctor who takes care of your diabetes knows about your planned surgery including the date and location.  How do I manage my blood sugar before surgery? Check your blood sugar at least 4 times a day, starting 2 days before surgery, to make sure that the level is not too high or low.  Check your blood sugar the morning of your surgery when you wake up and every 2 hours until you get to the Short Stay unit.  If your blood sugar is less than 70 mg/dL, you will need to treat for low blood sugar: Do not take insulin. Treat a low blood sugar (less than 70 mg/dL) with  cup of clear  juice (cranberry or apple), 4 glucose tablets, OR glucose gel. Recheck blood sugar in 15 minutes after treatment (to make sure it is greater than 70 mg/dL). If your blood sugar is not greater than 70 mg/dL on recheck, call [phone] for further instructions. Report your blood sugar to the short stay nurse when you get to Short Stay.  If you are admitted to the hospital after surgery: Your blood sugar will be checked by the staff and you will probably be  given insulin after surgery (instead of oral diabetes medicines) to make sure you have good blood sugar levels. The goal for blood sugar control after surgery is 80-180 mg/dL.  Do not wear jewelry or makeup. Do not wear lotions, powders, perfumes/cologne or deodorant. Do not shave 48 hours prior to surgery.  Men may shave face and neck. Do not bring valuables to the hospital. Do not wear nail polish, gel polish, artificial nails, or any other type of covering on natural nails (fingers and toes) If you have artificial nails or gel coating that need to be removed by a nail salon, please have this removed prior to surgery. Artificial nails or gel coating may interfere with anesthesia's ability to adequately monitor your vital signs.  [facility] is not responsible for any belongings or valuables.    Do NOT Smoke (Tobacco/Vaping)  24 hours prior to your procedure  If you use a CPAP at night, you may bring your mask for your overnight stay.   Contacts, glasses, hearing aids, dentures or partials may not be worn into surgery, please bring cases for these belongings   For patients admitted to the hospital, discharge time will be determined by your treatment team.   Patients discharged the day of surgery will not be allowed to drive home, and someone needs to stay with them for 24 hours.   SURGICAL WAITING ROOM VISITATION Patients having surgery or a procedure may have no more than 2 support people in the waiting area - these visitors may rotate.   Children under the age of 63 must have an adult with them who is not the patient. If the patient needs to stay at the hospital during part of their recovery, the visitor guidelines for inpatient rooms apply. Pre-op nurse will coordinate an appropriate time for 1 support person to accompany patient in pre-op.  This support person may not rotate.   Please refer to [email or web address] for the visitor  guidelines for Inpatients (after your surgery is over and you are in a regular room).    Special instructions:    Oral Hygiene is also important to reduce your risk of infection.  Remember - BRUSH YOUR TEETH THE MORNING OF SURGERY WITH YOUR REGULAR TOOTHPASTE   - Preparing For Surgery  Before surgery, you can play an important role. Because skin is not sterile, your skin needs to be as free of germs as possible. You can reduce the number of germs on your skin by washing with CHG (chlorahexidine gluconate) Soap before surgery.  CHG is an antiseptic cleaner which kills germs and bonds with the skin to continue killing germs even after washing.     Please do not use if you have an allergy to CHG or antibacterial soaps. If your skin becomes reddened/irritated stop using the CHG.  Do not shave (including legs and underarms) for at least 48 hours prior to first CHG shower. It is OK to shave your face.  Please follow these instructions carefully.  Shower the NIGHT BEFORE SURGERY and the MORNING OF SURGERY with CHG Soap.   If you chose to wash your hair, wash your hair first as usual with your normal shampoo. After you shampoo, rinse your hair and body thoroughly to remove the shampoo.  Then [employer] and genitals (private parts) with your normal soap and rinse thoroughly to remove soap.  After that Use CHG Soap as you would any other liquid soap. You can apply CHG directly to the skin and wash gently with a scrungie or a clean washcloth.   Apply the CHG Soap to your body ONLY FROM THE NECK DOWN.  Do not use on open wounds or open sores. Avoid contact with your eyes, ears, mouth and genitals (private parts). Wash Face and genitals (private parts)  with your normal soap.   Wash thoroughly, paying special attention to the area where your surgery will be performed.  Thoroughly rinse your body with warm water from the neck down.  DO NOT shower/wash with your normal soap after using  and rinsing off the CHG Soap.  Pat yourself dry with a CLEAN TOWEL.  Wear CLEAN PAJAMAS to bed the night before surgery  Place CLEAN SHEETS on your bed the night before your surgery  DO NOT SLEEP WITH PETS.   Day of Surgery:  Take a shower with CHG soap. Wear Clean/Comfortable clothing the morning of surgery Do not apply any deodorants/lotions.   Remember to brush your teeth WITH YOUR REGULAR TOOTHPASTE.    If you received a COVID test during your pre-op visit, it is requested that you wear a mask when out in public, stay away from anyone that may not be feeling well, and notify your surgeon if you develop symptoms. If you have been in contact with anyone that has tested positive in the last 10 days, please notify your surgeon.    Please read over the following fact sheets that you were given.

## 2022-02-25 ENCOUNTER — Encounter (HOSPITAL_COMMUNITY)
Admission: RE | Admit: 2022-02-25 | Discharge: 2022-02-25 | Disposition: A | Discharge status: Home / Self Care | Payer: Medicaid Other | Source: Ambulatory Visit | Attending: Orthopedic Surgery | Admitting: Orthopedic Surgery

## 2022-02-25 ENCOUNTER — Other Ambulatory Visit: Payer: Self-pay

## 2022-02-25 ENCOUNTER — Encounter (HOSPITAL_COMMUNITY): Payer: Self-pay

## 2022-02-25 VITALS — BP 153/75 | HR 84 | Temp 98.1°F | Resp 16 | Ht 60.0 in | Wt 186.0 lb

## 2022-02-25 DIAGNOSIS — Z01812 Encounter for preprocedural laboratory examination: Secondary | ICD-10-CM | POA: Insufficient documentation

## 2022-02-25 DIAGNOSIS — Z01818 Encounter for other preprocedural examination: Secondary | ICD-10-CM

## 2022-02-25 HISTORY — DX: Depression, unspecified: F32.A

## 2022-02-25 HISTORY — DX: Unspecified osteoarthritis, unspecified site: M19.90

## 2022-02-25 HISTORY — DX: Anxiety disorder, unspecified: F41.9

## 2022-02-25 LAB — CBC
HCT: 44.1 % (ref 36.0–46.0)
Hemoglobin: 13.1 g/dL (ref 12.0–15.0)
MCH: 23.9 pg — ABNORMAL LOW (ref 26.0–34.0)
MCHC: 29.7 g/dL — ABNORMAL LOW (ref 30.0–36.0)
MCV: 80.6 fL (ref 80.0–100.0)
Platelets: 463 10*3/uL — ABNORMAL HIGH (ref 150–400)
RBC: 5.47 MIL/uL — ABNORMAL HIGH (ref 3.87–5.11)
RDW: 14.4 % (ref 11.5–15.5)
WBC: 9.2 10*3/uL (ref 4.0–10.5)
nRBC: 0 % (ref 0.0–0.2)

## 2022-02-25 LAB — BASIC METABOLIC PANEL
Anion gap: 9 (ref 5–15)
BUN: 15 mg/dL (ref 6–20)
CO2: 26 mmol/L (ref 22–32)
Calcium: 9.4 mg/dL (ref 8.9–10.3)
Chloride: 101 mmol/L (ref 98–111)
Creatinine, Ser: 1.01 mg/dL — ABNORMAL HIGH (ref 0.44–1.00)
GFR, Estimated: >60 mL/min (ref >60)
Glucose, Bld: 146 mg/dL — ABNORMAL HIGH (ref 70–99)
Potassium: 4.6 mmol/L (ref 3.5–5.1)
Sodium: 136 mmol/L (ref 135–145)

## 2022-02-25 LAB — SURGICAL PCR SCREEN
MRSA, PCR: NEGATIVE
Staphylococcus aureus: NEGATIVE

## 2022-02-25 LAB — GLUCOSE, CAPILLARY: Glucose-Capillary: 218 mg/dL — ABNORMAL HIGH (ref 70–99)

## 2022-02-25 NOTE — Pre-Procedure Instructions (Signed)
Surgical Instructions    Your procedure is scheduled on Thursday, February 22nd, 2024   Report to [place] Entrance "A" at 12:00 Noon, then check in with the Admitting office.  Call this number if you have problems the morning of surgery:  [phone]   If you have any questions prior to your surgery date call [phone]: Open Monday-Friday 8am-4pm If you experience any cold or flu symptoms such as cough, fever, chills, shortness of breath, etc. between now and your scheduled surgery, please notify us at the above number     Remember:  Do not eat after midnight the night before your surgery  You may drink clear liquids until 11:00 A.M. the morning of your surgery.   Clear liquids allowed are: Water, Non-Citrus Juices (without pulp), Carbonated Beverages, Clear Tea, Black Coffee ONLY (NO MILK, CREAM OR POWDERED CREAMER of any kind), and Gatorade    Take these medicines the morning of surgery with A SIP OF WATER:    benzonatate (TESSALON)   buPROPion (WELLBUTRIN XL)   fenofibrate (TRICOR)   FLUoxetine (PROZAC)   gabapentin (NEURONTIN)   pantoprazole (PROTONIX)   rosuvastatin (CRESTOR)   VYVANSE     Take these medicines if needed:   albuterol (PROAIR HFA)   cyclobenzaprine (FLEXERIL)   HYDROcodone-acetaminophen (NORCO)   hydrOXYzine (ATARAX)   nitroGLYCERIN (NITROSTAT)   promethazine (PHENERGAN)   Follow your surgeon's instructions on when to stop Aspirin.  If no instructions were given by your surgeon then you will need to call the office to get those instructions.     As of today, STOP taking any Aspirin (unless otherwise instructed by your surgeon) Aleve, Naproxen, Ibuprofen, Motrin, Advil, Goody's, BC's, all herbal medications, fish oil, and all vitamins.   WHAT DO I DO ABOUT MY DIABETES MEDICATION?  Please contact your PCP or Endocrinologist for instructions regarding your insulin pump. If you do not receive instructions then please reduce all basal rates by  20% at midnight the night before your surgery.   The day of surgery, keep all basal rates at reduced rate 20% or lower.   If your CBG is greater than 220 mg/dL, you may take  of your sliding scale (correction) dose of insulin.   HOW TO MANAGE YOUR DIABETES BEFORE AND AFTER SURGERY  Why is it important to control my blood sugar before and after surgery? Improving blood sugar levels before and after surgery helps healing and can limit problems. A way of improving blood sugar control is eating a healthy diet by:  Eating less sugar and carbohydrates  Increasing activity/exercise  Talking with your doctor about reaching your blood sugar goals High blood sugars (greater than 180 mg/dL) can raise your risk of infections and slow your recovery, so you will need to focus on controlling your diabetes during the weeks before surgery. Make sure that the doctor who takes care of your diabetes knows about your planned surgery including the date and location.  How do I manage my blood sugar before surgery? Check your blood sugar at least 4 times a day, starting 2 days before surgery, to make sure that the level is not too high or low.  Check your blood sugar the morning of your surgery when you wake up and every 2 hours until you get to the Short Stay unit.  If your blood sugar is less than 70 mg/dL, you will need to treat for low blood sugar: Do not take insulin. Treat a low blood sugar (less than  70 mg/dL) with  cup of clear juice (cranberry or apple), 4 glucose tablets, OR glucose gel. Recheck blood sugar in 15 minutes after treatment (to make sure it is greater than 70 mg/dL). If your blood sugar is not greater than 70 mg/dL on recheck, call [phone] for further instructions. Report your blood sugar to the short stay nurse when you get to Short Stay.  If you are admitted to the hospital after surgery: Your blood sugar will be checked by the staff and you will probably be given insulin after  surgery (instead of oral diabetes medicines) to make sure you have good blood sugar levels. The goal for blood sugar control after surgery is 80-180 mg/dL.   The day of surgery:  Do not wear jewelry or makeup. Do not wear lotions, powders, perfumes or deodorant. Do not shave 48 hours prior to surgery.   Do not bring valuables to the hospital. Do not wear nail polish, gel polish, artificial nails, or any other type of covering on natural nails (fingers and toes) If you have artificial nails or gel coating that need to be removed by a nail salon, please have this removed prior to surgery. Artificial nails or gel coating may interfere with anesthesia's ability to adequately monitor your vital signs.  [facility] is not responsible for any belongings or valuables.    Do NOT Smoke (Tobacco/Vaping)  24 hours prior to your procedure  If you use a CPAP at night, you may bring your mask for your overnight stay.   Contacts, glasses, hearing aids, dentures or partials may not be worn into surgery, please bring cases for these belongings   For patients admitted to the hospital, discharge time will be determined by your treatment team.   Patients discharged the day of surgery will not be allowed to drive home, and someone needs to stay with them for 24 hours.   SURGICAL WAITING ROOM VISITATION Patients having surgery or a procedure may have no more than 2 support people in the waiting area - these visitors may rotate.   Children under the age of 59 must have an adult with them who is not the patient. If the patient needs to stay at the hospital during part of their recovery, the visitor guidelines for inpatient rooms apply. Pre-op nurse will coordinate an appropriate time for 1 support person to accompany patient in pre-op.  This support person may not rotate.   Please refer to [email or web address] for the visitor guidelines for Inpatients (after  your surgery is over and you are in a regular room).    Special instructions:    Oral Hygiene is also important to reduce your risk of infection.  Remember - BRUSH YOUR TEETH THE MORNING OF SURGERY WITH YOUR REGULAR TOOTHPASTE   [facility]- Preparing For Surgery  Before surgery, you can play an important role. Because skin is not sterile, your skin needs to be as free of germs as possible. You can reduce the number of germs on your skin by washing with CHG (chlorahexidine gluconate) Soap before surgery.  CHG is an antiseptic cleaner which kills germs and bonds with the skin to continue killing germs even after washing.     Please do not use if you have an allergy to CHG or antibacterial soaps. If your skin becomes reddened/irritated stop using the CHG.  Do not shave (including legs and underarms) for at least 48 hours prior to first CHG shower. It is OK to shave your face.  Please  follow these instructions carefully.     Shower the NIGHT BEFORE SURGERY and the MORNING OF SURGERY with CHG Soap.   If you chose to wash your hair, wash your hair first as usual with your normal shampoo. After you shampoo, rinse your hair and body thoroughly to remove the shampoo.  Then [employer] and genitals (private parts) with your normal soap and rinse thoroughly to remove soap.  After that Use CHG Soap as you would any other liquid soap. You can apply CHG directly to the skin and wash gently with a scrungie or a clean washcloth.   Apply the CHG Soap to your body ONLY FROM THE NECK DOWN.  Do not use on open wounds or open sores. Avoid contact with your eyes, ears, mouth and genitals (private parts). Wash Face and genitals (private parts)  with your normal soap.   Wash thoroughly, paying special attention to the area where your surgery will be performed.  Thoroughly rinse your body with warm water from the neck down.  DO NOT shower/wash with your normal soap after using and rinsing off the CHG  Soap.  Pat yourself dry with a CLEAN TOWEL.  Wear CLEAN PAJAMAS to bed the night before surgery  Place CLEAN SHEETS on your bed the night before your surgery  DO NOT SLEEP WITH PETS.   Day of Surgery:  Take a shower with CHG soap. Wear Clean/Comfortable clothing the morning of surgery Do not apply any deodorants/lotions.   Remember to brush your teeth WITH YOUR REGULAR TOOTHPASTE.    If you received a COVID test during your pre-op visit, it is requested that you wear a mask when out in public, stay away from anyone that may not be feeling well, and notify your surgeon if you develop symptoms. If you have been in contact with anyone that has tested positive in the last 10 days, please notify your surgeon.    Please read over the following fact sheets that you were given.

## 2022-02-25 NOTE — Progress Notes (Addendum)
PCP - Suzzanne Cloud, FNP Cardiologist - Jenkins Rouge, MD  PPM/ICD - denies  Chest x-ray - n/a EKG - 12/10/21 Stress Test - 03/07/20 ECHO - 06/17/21 Cardiac Cath - 05/31/20  Sleep Study - denies CPAP - n/a  Fasting Blood Sugar - 100 - 125 Checks Blood Sugar - patient has CGM CBG today - 218 A1C 6.8 - 01/14/22  Patient has insulin pump: Patient was instructed: Please contact your PCP or Endocrinologist for instructions regarding your insulin pump. If you do not receive instructions then please reduce all basal rates by 20% at midnight the night before your surgery.   Last dose of GLP1 agonist-  n/a   Blood Thinner Instructions: n/a Aspirin Instructions - last dose - 02/25/22 Patient was instructed: As of today, STOP taking any Aspirin (unless otherwise instructed by your surgeon) Aleve, Naproxen, Ibuprofen, Motrin, Advil, Goody's, BC's, all herbal medications, fish oil, and all vitamins.   ERAS Protcol - yes, until 11:00 o'clock  COVID TEST- n/a  Patient has bladder stimulator - patient verbalized that she must call MD office the day prior surgery for instructions.    Anesthesia review: yes - cardiac history; Pre-op clearance on 02/06/22  Patient denies shortness of breath, fever, cough and chest pain at PAT appointment   All instructions explained to the patient, with a verbal understanding of the material. Patient agrees to go over the instructions while at home for a better understanding. Patient also instructed to self quarantine after being tested for COVID-19. The opportunity to ask questions was provided.

## 2022-02-27 NOTE — Progress Notes (Signed)
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 BMI is consistent with obesity.   Preoperative cardiology risk assessment outlined on 02/06/22 by Nicholes Rough, PA-C, "Ms. Warchol's perioperative risk of a major cardiac event is 6.6% according to the Revised Cardiac Risk Index (RCRI).  Therefore, she is at high risk for perioperative complications.   Her functional capacity is fair at 4.73 METs according to the [facility] Activity Status Index (DASI). Recommendations: According to ACC/AHA guidelines, no further cardiovascular testing needed.  The patient may proceed to surgery at acceptable risk.   Antiplatelet and/or Anticoagulation Recommendations: Aspirin can be held for 5-7 days prior to her surgery.  Please resume Aspirin post operatively when it is felt to be safe from a bleeding standpoint."  A1c was 6.8% on 01/14/22. She has a Omnipod TDD 100 Unit/mL for TDD around 150 units daily. Pod is changed every 72 hours, unless insulin runs out first. She has a Dexcom 6 continuous glucose monitor. Reported fasting CBGs ~ 100-125. Patient told me that she is  able to adjust her basal rate. She was advised to contact endocrinologist for Omnipod recommendations prior to surgery and notify provider of surgery time. She is an afternoon case, so we discussed closed monitoring of her glucose while NPO and to contact numbers provided if managing glucose at home is challenging as we could consider bringing her in earlier for closer monitoring. PAT RN provided instructions for how to manage blood sugar prior to surgery. Fortunately she has a Dexcom that will assist her in close glucose monitoring. Order for Diabetes Coordinator consult added given insulin pump.   She has InterStim bladder device. She says she is going to contact the Medtr[employer] rep the day before surgery to review how to suspend her stimulator. It sounds like she can remotely turn it on or off using her smart phone.   Last ASA 02/25/22.   Anesthesia team to evaluate on the day of surgery.   VS: BP (!) 153/75   Pulse 84   Temp 36.7 C   Resp 16   Ht 5' (1.524 m)   Wt 84.4 kg   SpO2 97%   BMI 36.33 kg/m   PROVIDERS: Coolidge Breeze, FNP is PCP Jenkins Rouge, MD is cardiologist [name], MD is urologist Manus Rudd, MD is GI Rayetta Pigg NP is endocrinology provider   LABS: Labs reviewed: Acceptable for surgery. A1c 6.8% 01/14/22.  (all labs ordered are listed, but only abnormal results are displayed)  Labs Reviewed  GLUCOSE, CAPILLARY - Abnormal; Notable for the following components:      Result Value   Glucose-Capillary 218 (*)    All other components within normal limits  CBC - Abnormal;  Notable for the following components:   RBC 5.47 (*)    MCH 23.9 (*)    MCHC 29.7 (*)    Platelets 463 (*)    All other components within normal limits  BASIC METABOLIC PANEL - Abnormal; Notable for the following components:   Glucose, Bld 146 (*)    Creatinine, Ser 1.01 (*)    All other components within normal limits  SURGICAL PCR SCREEN    IMAGES: MRI C-spine  01/21/22: IMPRESSION: 1. Motion degraded study. 2. Broad-based disc protrusion at C4-5 with mild central canal stenosis and mild to moderate foraminal encroachment bilaterally. Possible cord hyperintensity at C4-5. 3. Moderately large central disc protrusion at C6-7 with cord flattening and mild spinal stenosis. Cord hyperintensity at C6-7.   MRI L-spine 01/21/22: IMPRESSION: 1. S1 is a lumbarized vertebra. 2. Mild lumbar degenerative change. Mild central canal stenosis L5-S1. No neural impingement. 3. Subcutaneous fluid/edema in the posterior subcutaneous fat. Correlate with surgical history. Correlate with any symptoms of infection.  CTA Chest 12/10/21: IMPRESSION: 1. No PE, aneurysm or dissection. 2. Nodular alveolar densities left lower lobe most consistent with pneumonitis. Follow up is patchy nodular alveolar process in the left lower lobe likely representing pneumonia. A noncontrast CT recommended in 3 months.    EKG: EKG 12/10/21: inus rhythm Borderline right axis deviation Baseline wander Abnormal ECG Confirmed by Carmin Muskrat [phone]) on 12/11/2021 10:55:34 PM   CV: Echo 06/17/21: IMPRESSIONS   1. Septal hypokinesis inferior basal akinesis . Left ventricular ejection  fraction, by estimation, is 50 to 55%. The left ventricle has low normal  function. The left ventricle demonstrates regional wall motion  abnormalities (see scoring diagram/findings   for description). Left ventricular diastolic parameters were normal.   2. Right ventricular systolic function is normal. The right ventricular  size is normal.   3. The mitral valve is abnormal. Trivial mitral valve regurgitation. No  evidence of mitral stenosis.   4. The aortic valve is tricuspid. Aortic valve regurgitation is not  visualized. No aortic stenosis is present.   5. The inferior vena cava is normal in size with greater than 50%  respiratory variability, suggesting right atrial pressure of 3 mmHg.  -  Comparison 03/27/20: LVEF 50-55%, severe hypokinesis / akinesis of the basal / mid inferior wall.   LHC 05/31/20: Prox RCA lesion is 30% stenosed. RPDA lesion is 100% stenosed. Prox Cx lesion is 40% stenosed. 1st Mrg lesion is 30% stenosed. Mid Cx lesion is 20% stenosed. 2nd Mrg lesion is 60% stenosed. Mid RCA to Dist RCA lesion is 50% stenosed. Prox RCA to Mid RCA lesion is 70% stenosed. Prox LAD lesion is 20% stenosed. Ramus lesion is 30% stenosed. Mid LAD lesion is 20% stenosed.   Multivessel CAD with diffuse small vessels in this diabetic female.  The LAD has mild 20% areas of narrowing.  The ramus intermediate vessel has 30% narrowing.  The circumflex vessel has a percent proximal narrowing on a bend in the vessel and then has 20 to 30% stenoses.  There is focal 60% stenosis in the second marginal vessel.  From the left system there is collateralization to the very distal PDA.  The right coronary artery is diffusely small vessel in its mid segment with focal narrowing of 70% after the anterior RV marginal branch.  The mid -distal PDA is occluded and collateralized from the left circulation.   LVEDP 10 mmHg   RECOMMENDATION: Medical therapy for CAD with optimal blood pressure control, aggressive lipid management  particularly with this patient with significant xanthelasmas on exam diabetic control.    Nuclear stress test 03/07/20: No diagnostic ST segment changes indicate ischemia. Small, mild intensity, apical septal and inferior basal defects that are partially reversible in the setting of significant breast attenuation and also radiotracer uptake near the inferior wall, most likely artifactual. No large ischemic territories. This is a low risk study. Nuclear stress EF: 45%, visually appears normal. Suggest echocardiogram.   Past Medical History:  Diagnosis Date   ADHD (attention deficit hyperactivity disorder)    Allergic rhinitis    Anemia    hx of   Anxiety    Arthritis     Barrett's esophagus    Bipolar 1 disorder (HCC)    Bladder tumor    CAD (coronary artery disease)    Cancer ([place])    bladder   Constipation    Depression    Dilated cardiomyopathy ([name]) 12/20/2020   Echo 6/22 ([facility]): EF 43, mild MR, mild TR // Echo 3/22: EF 50-55   Dizziness    Full dentures    GERD (gastroesophageal reflux disease)    Gross hematuria    Hyperlipidemia    Hypertension    Stable angina    Type 2 diabetes mellitus ([place])    Urgency of urination    dysuria, sui   Wears glasses     Past Surgical History:  Procedure Laterality Date   ABDOMINAL HYSTERECTOMY     BALLOON DILATION N/A 02/17/2021   Procedure: BALLOON DILATION;  Surgeon: Eloise Harman, DO;  [facility];  Service: Endoscopy;  Laterality: N/A;   BIOPSY  02/17/2021   Procedure: BIOPSY;  Surgeon: Eloise Harman, DO;  [facility];  Service: Endoscopy;;   COLONOSCOPY WITH PROPOFOL N/A 02/17/2021   Surgeon: Eloise Harman, DO; nonbleeding internal hemorrhoids, otherwise normal exam.  Repeat in 10 years.   CYSTOSCOPY N/A 10/03/2015   Procedure: CYSTOSCOPY;  Surgeon: Irine Seal, MD;  [facility];  Service: Urology;  Laterality: N/A;   CYSTOSCOPY W/ URETERAL STENT PLACEMENT Right 07/24/2021   Procedure: CYSTOSCOPY WITH RETROGRADE PYELOGRAM/URETERAL STENT PLACEMENT;  Surgeon: Cleon Gustin, MD;  Location: [facility];  Service: Urology;  Laterality: Right;   CYSTOSCOPY WITH RETROGRADE PYELOGRAM, URETEROSCOPY AND STENT PLACEMENT Right 08/14/2021   Procedure: CYSTOSCOPY WITH RETROGRADE PYELOGRAM, URETEROSCOPY AND STENT EXCHANGE;  Surgeon: Cleon Gustin, MD;  Location: [facility];  Service: Urology;  Laterality: Right;   CYSTOSCOPY WITH STENT PLACEMENT Right 10/31/2015   Procedure: CYSTOSCOPY WITH ATTEMPTED RIGHT URETERAL OPENING;  Surgeon: Irine Seal, MD;  [facility]: [facility];  Service: Urology;  Laterality: Right;    ESOPHAGOGASTRODUODENOSCOPY (EGD) WITH PROPOFOL N/A 02/17/2021   Surgeon: Eloise Harman, DO; 2 cm hiatal hernia, Barrett's esophagus, gastritis with biopsies negative for H. pylori.  S/p empiric esophageal dilation.  Repeat in 3 years.   FOOT SURGERY Left    September 2022, broke 7 bones in left foot   INTERSTIM IMPLANT PLACEMENT N/A 03/14/2018   Procedure: Barrie Lyme IMPLANT FIRST STAGE;  Surgeon: Cleon Gustin, MD;  Location: [facility];  Service: Urology;  Laterality: N/A;   INTERSTIM IMPLANT PLACEMENT N/A 03/30/2018   Procedure: Barrie Lyme IMPLANT SECOND STAGE;  Surgeon: Cleon Gustin, MD;  Location: [facility];  Service: Urology;  Laterality: N/A;   INTERSTIM IMPLANT REVISION N/A 12/01/2021   Procedure: REVISION OF Barrie Lyme- generator anf lead replacement;  Surgeon: Cleon Gustin, MD;  Location: [facility];  Service: Urology;  Laterality: N/A;   LEFT HEART CATH AND CORONARY ANGIOGRAPHY N/A 05/31/2020   Procedure: LEFT HEART CATH AND CORONARY ANGIOGRAPHY;  Surgeon: Troy Sine, MD;  Location: [place] [facility];  Service: Cardiovascular;  Laterality: N/A;   MULTIPLE TOOTH EXTRACTIONS  2015   OVARIAN CYST REMOVAL Right 2004 approx   SHOULDER OPEN ROTATOR CUFF REPAIR Left 04/20/2019   Procedure: ROTATOR CUFF REPAIR SHOULDER OPEN;  Surgeon: Carole Civil, MD;  Location: [facility];  Service: Orthopedics;  Laterality: Left;   TONSILLECTOMY  12/30/2004   TOTAL ABDOMINAL HYSTERECTOMY W/ BILATERAL SALPINGOOPHORECTOMY  05/16/2004   TRANSURETHRAL RESECTION OF BLADDER TUMOR N/A 10/03/2015   Procedure: TRANSURETHRAL RESECTION OF BLADDER TUMOR (TURBT);  Surgeon: Irine Seal, MD;  Location: [facility];  Service: Urology;  Laterality: N/A;   TRANSURETHRAL RESECTION OF BLADDER TUMOR N/A 10/31/2015   Procedure: RE-STAGINGG TRANSURETHRAL RESECTION OF BLADDER TUMOR (TURBT);  Surgeon: Irine Seal, MD;  Location: [facility];  Service: Urology;  Laterality: N/A;    TRANSURETHRAL RESECTION OF BLADDER TUMOR N/A 08/17/2016   Procedure: TRANSURETHRAL RESECTION OF BLADDER TUMOR (TURBT);  Surgeon: Cleon Gustin, MD;  Location: [facility];  Service: Urology;  Laterality: N/A;   URETERAL BIOPSY Right 08/14/2021   Procedure: URETERAL BIOPSY;  Surgeon: Cleon Gustin, MD;  Location: [facility];  Service: Urology;  Laterality: Right;    MEDICATIONS:  ACCU-CHEK GUIDE test strip   Accu-Chek Softclix Lancets lancets   albuterol (PROAIR HFA) 108 (90 Base) MCG/ACT inhaler   aspirin EC 81 MG tablet   benzonatate (TESSALON) 100 MG capsule   Blood Glucose Monitoring Suppl (ACCU-CHEK GUIDE) w/Device KIT   Blood Pressure Monitoring (BLOOD PRESSURE CUFF) MISC   buPROPion (WELLBUTRIN XL) 300 MG 24 hr tablet   cloNIDine HCl (KAPVAY) 0.1 MG TB12 ER tablet   Continuous Blood Gluc Sensor (DEXCOM G6 SENSOR) MISC   Continuous Blood Gluc Transmit (DEXCOM G6 TRANSMITTER) MISC   cyclobenzaprine (FLEXERIL) 10 MG tablet   Evolocumab (REPATHA SURECLICK) [id number] MG/ML SOAJ   fenofibrate (TRICOR) 145 MG tablet   FLUoxetine (PROZAC) 20 MG capsule   FLUoxetine (PROZAC) 40 MG capsule   gabapentin (NEURONTIN) 600 MG tablet   GLOBAL EASE INJECT PEN NEEDLES 31G X 5 MM MISC   HYDROcodone-acetaminophen (NORCO) 10-325 MG tablet   HYDROcodone-acetaminophen (NORCO) 5-325 MG tablet   hydrOXYzine (ATARAX) 25 MG tablet   insulin aspart (NOVOLOG) 100 UNIT/ML injection   Insulin Disposable Pump (OMNIPOD DASH PODS, GEN 4,) MISC   Insulin Pen Needle (PEN NEEDLES) 31G X 6 MM MISC   INSULIN SYRINGE 1CC/29G 29G X 1/2" 1 ML MISC   linaclotide (LINZESS) 72 MCG capsule   nitroGLYCERIN (NITROSTAT) 0.4 MG SL tablet   pantoprazole (PROTONIX) 40 MG tablet   promethazine (PHENERGAN) 12.5 MG tablet   rosuvastatin (CRESTOR) 40 MG tablet   tamsulosin (FLOMAX) 0.4 MG CAPS capsule   VYVANSE 50 MG capsule    betamethasone acetate-betamethasone sodium phosphate (CELESTONE) injection 3 mg    Myra Gianotti,  PA-C Surgical Short Stay/Anesthesiology [facility] Phone [phone] [facility] Phone [phone] 02/27/2022 10:05 AM

## 2022-02-27 NOTE — Anesthesia Preprocedure Evaluation (Signed)
Anesthesia Evaluation  Patient identified by MRN, date of birth, ID band Patient awake    Reviewed: Allergy & Precautions, NPO status , Patient's Chart, lab work & pertinent test results, reviewed documented beta blocker date and time   Airway Mallampati: III  TM Distance: >3 FB Neck ROM: Limited    Dental  (+) Upper Dentures, Lower Dentures   Pulmonary former smoker   Pulmonary exam normal breath sounds clear to auscultation       Cardiovascular hypertension, Pt. 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   Abdominal  (+) + obese  Peds  Hematology  (+) Blood dyscrasia, anemia   Anesthesia Other Findings   Reproductive/Obstetrics                              Anesthesia Physical Anesthesia Plan  ASA: 3  Anesthesia Plan: General   Post-op Pain Management: Precedex, Ketamine IV* and Ofirmev IV (intra-op)*   Induction: Intravenous  PONV Risk Score and Plan: 4 or greater and Treatment may vary due to age or medical condition, Midazolam, Ondansetron and Dexamethasone  Airway Management Planned: Oral ETT and Video Laryngoscope Planned  Additional Equipment: None  Intra-op Plan:   Post-operative Plan: Extubation in OR  Informed Consent: I have reviewed the patients History and Physical, chart, labs and discussed the procedure including the risks, benefits and alternatives for the proposed anesthesia  with the patient or authorized representative who has indicated his/her understanding and acceptance.     Dental advisory given  Plan Discussed with: CRNA and Anesthesiologist  Anesthesia Plan Comments: (PAT note written 02/27/2022 by Myra Gianotti, PA-C.  )        Anesthesia Quick Evaluation

## 2022-03-04 ENCOUNTER — Telehealth: Payer: Self-pay | Admitting: Nurse Practitioner

## 2022-03-04 MED ORDER — NOVOLOG FLEXPEN 100 UNIT/ML ~~LOC~~ SOPN: 8.0000 [IU] | PEN_INJECTOR | Freq: Three times a day (TID) | SUBCUTANEOUS | 0 refills | Status: DC

## 2022-03-04 MED ORDER — LANTUS SOLOSTAR 100 UNIT/ML ~~LOC~~ SOPN: 35.0000 [IU] | PEN_INJECTOR | Freq: Every day | SUBCUTANEOUS | 0 refills | Status: DC

## 2022-03-04 NOTE — Addendum Note (Signed)
Addended by: Lavell Luster A on: 03/04/2022 11:17 AM   Modules accepted: Orders

## 2022-03-04 NOTE — Telephone Encounter (Signed)
She will have to go back to basal/bolus injections with long and short acting insulin.  Unfortunately, we do not have any samples to give to her so we can send in for Lantus or something similar to her pharmacy.  Based on her last upload with her pump here in October, she would need around 35 units of Lantus and 8-14 units of Novolog TID with meals.  BUT, since she has surgery tomorrow, she needs to only take half of the normal Lantus dose which would be 18 units or so since I'm sure she will be fasting for surgery anyways.

## 2022-03-04 NOTE — Telephone Encounter (Signed)
Pt notified and agrees. She will only take 18 units of Lantus tonight before surgery.

## 2022-03-04 NOTE — Telephone Encounter (Signed)
Pt called and stated that she is having surgery tomorrow, and her pump is broken, so she needs to know what to do about insulin.

## 2022-03-05 ENCOUNTER — Ambulatory Visit (HOSPITAL_COMMUNITY): Payer: Medicaid Other | Admitting: Vascular Surgery

## 2022-03-05 ENCOUNTER — Ambulatory Visit (HOSPITAL_BASED_OUTPATIENT_CLINIC_OR_DEPARTMENT_OTHER): Payer: Medicaid Other | Admitting: Anesthesiology

## 2022-03-05 ENCOUNTER — Other Ambulatory Visit: Payer: Self-pay

## 2022-03-05 ENCOUNTER — Encounter (HOSPITAL_COMMUNITY)
Admission: RE | Disposition: A | Discharge status: Home / Self Care | Payer: Self-pay | Source: Home / Self Care | Attending: Orthopedic Surgery

## 2022-03-05 ENCOUNTER — Encounter (HOSPITAL_COMMUNITY): Payer: Self-pay | Admitting: Orthopedic Surgery

## 2022-03-05 ENCOUNTER — Ambulatory Visit (HOSPITAL_COMMUNITY)
Admission: RE | Admit: 2022-03-05 | Discharge: 2022-03-06 | Disposition: A | Discharge status: Home / Self Care | Payer: Medicaid Other | Attending: Orthopedic Surgery | Admitting: Orthopedic Surgery

## 2022-03-05 ENCOUNTER — Ambulatory Visit (HOSPITAL_COMMUNITY): Payer: Medicaid Other

## 2022-03-05 DIAGNOSIS — M50123 Cervical disc disorder at C6-C7 level with radiculopathy: Secondary | ICD-10-CM

## 2022-03-05 DIAGNOSIS — F319 Bipolar disorder, unspecified: Secondary | ICD-10-CM | POA: Diagnosis not present

## 2022-03-05 DIAGNOSIS — Z7984 Long term (current) use of oral hypoglycemic drugs: Secondary | ICD-10-CM | POA: Insufficient documentation

## 2022-03-05 DIAGNOSIS — Z794 Long term (current) use of insulin: Secondary | ICD-10-CM | POA: Diagnosis not present

## 2022-03-05 DIAGNOSIS — Z87891 Personal history of nicotine dependence: Secondary | ICD-10-CM | POA: Diagnosis not present

## 2022-03-05 DIAGNOSIS — E119 Type 2 diabetes mellitus without complications: Secondary | ICD-10-CM | POA: Insufficient documentation

## 2022-03-05 DIAGNOSIS — M50023 Cervical disc disorder at C6-C7 level with myelopathy: Secondary | ICD-10-CM | POA: Diagnosis present

## 2022-03-05 DIAGNOSIS — K219 Gastro-esophageal reflux disease without esophagitis: Secondary | ICD-10-CM | POA: Diagnosis not present

## 2022-03-05 DIAGNOSIS — F909 Attention-deficit hyperactivity disorder, unspecified type: Secondary | ICD-10-CM | POA: Insufficient documentation

## 2022-03-05 DIAGNOSIS — Z6834 Body mass index (BMI) 34.0-34.9, adult: Secondary | ICD-10-CM | POA: Insufficient documentation

## 2022-03-05 DIAGNOSIS — F419 Anxiety disorder, unspecified: Secondary | ICD-10-CM | POA: Insufficient documentation

## 2022-03-05 DIAGNOSIS — I10 Essential (primary) hypertension: Secondary | ICD-10-CM | POA: Insufficient documentation

## 2022-03-05 DIAGNOSIS — E785 Hyperlipidemia, unspecified: Secondary | ICD-10-CM | POA: Insufficient documentation

## 2022-03-05 DIAGNOSIS — Z01818 Encounter for other preprocedural examination: Secondary | ICD-10-CM

## 2022-03-05 DIAGNOSIS — E669 Obesity, unspecified: Secondary | ICD-10-CM | POA: Insufficient documentation

## 2022-03-05 DIAGNOSIS — G959 Disease of spinal cord, unspecified: Secondary | ICD-10-CM | POA: Diagnosis present

## 2022-03-05 DIAGNOSIS — I25119 Atherosclerotic heart disease of native coronary artery with unspecified angina pectoris: Secondary | ICD-10-CM | POA: Diagnosis not present

## 2022-03-05 DIAGNOSIS — M1990 Unspecified osteoarthritis, unspecified site: Secondary | ICD-10-CM | POA: Insufficient documentation

## 2022-03-05 DIAGNOSIS — D649 Anemia, unspecified: Secondary | ICD-10-CM | POA: Diagnosis not present

## 2022-03-05 DIAGNOSIS — E1149 Type 2 diabetes mellitus with other diabetic neurological complication: Secondary | ICD-10-CM

## 2022-03-05 HISTORY — PX: ANTERIOR CERVICAL DECOMP/DISCECTOMY FUSION: SHX1161

## 2022-03-05 LAB — GLUCOSE, CAPILLARY
Glucose-Capillary: 131 mg/dL — ABNORMAL HIGH (ref 70–99)
Glucose-Capillary: 109 mg/dL — ABNORMAL HIGH (ref 70–99)
Glucose-Capillary: 144 mg/dL — ABNORMAL HIGH (ref 70–99)
Glucose-Capillary: 175 mg/dL — ABNORMAL HIGH (ref 70–99)

## 2022-03-05 SURGERY — ANTERIOR CERVICAL DECOMPRESSION/DISCECTOMY FUSION 1 LEVEL
Anesthesia: General | Site: Spine Cervical

## 2022-03-05 MED ORDER — HYDROMORPHONE HCL 1 MG/ML IJ SOLN
INTRAMUSCULAR | Status: AC
  Filled 2022-03-05: qty 0.5

## 2022-03-05 MED ORDER — FLUOXETINE HCL 20 MG PO CAPS
40.0000 mg | ORAL_CAPSULE | Freq: Every morning | ORAL | Status: DC
  Administered 2022-03-06: 40 mg via ORAL
  Filled 2022-03-05: qty 2

## 2022-03-05 MED ORDER — PANTOPRAZOLE SODIUM 40 MG PO TBEC
40.0000 mg | DELAYED_RELEASE_TABLET | Freq: Two times a day (BID) | ORAL | Status: DC
  Administered 2022-03-06: 40 mg via ORAL
  Filled 2022-03-05: qty 1

## 2022-03-05 MED ORDER — PHENOL 1.4 % MT LIQD: 1.0000 | OROMUCOSAL | Status: DC | PRN

## 2022-03-05 MED ORDER — PROPOFOL 10 MG/ML IV BOLUS
INTRAVENOUS | Status: AC
  Filled 2022-03-05: qty 20

## 2022-03-05 MED ORDER — HYDROMORPHONE HCL 1 MG/ML IJ SOLN
INTRAMUSCULAR | Status: AC
  Filled 2022-03-05: qty 1

## 2022-03-05 MED ORDER — PHENYLEPHRINE HCL-NACL 20-0.9 MG/250ML-% IV SOLN
INTRAVENOUS | Status: DC | PRN
  Administered 2022-03-05: 50 ug/min via INTRAVENOUS

## 2022-03-05 MED ORDER — ACETAMINOPHEN 650 MG RE SUPP: 650.0000 mg | RECTAL | Status: DC | PRN

## 2022-03-05 MED ORDER — THROMBIN 20000 UNITS EX SOLR
CUTANEOUS | Status: DC | PRN
  Administered 2022-03-05: 20000 [IU] via TOPICAL

## 2022-03-05 MED ORDER — FLUOXETINE HCL 20 MG PO CAPS
20.0000 mg | ORAL_CAPSULE | Freq: Every day | ORAL | Status: DC
  Administered 2022-03-05: 20 mg via ORAL
  Filled 2022-03-05: qty 1

## 2022-03-05 MED ORDER — PROPOFOL 500 MG/50ML IV EMUL
INTRAVENOUS | Status: DC | PRN
  Administered 2022-03-05: 150 ug/kg/min via INTRAVENOUS

## 2022-03-05 MED ORDER — CYCLOBENZAPRINE HCL 10 MG PO TABS: 10.0000 mg | ORAL_TABLET | Freq: Three times a day (TID) | ORAL | 0 refills | Status: DC | PRN

## 2022-03-05 MED ORDER — 0.9 % SODIUM CHLORIDE (POUR BTL) OPTIME
TOPICAL | Status: DC | PRN
  Administered 2022-03-05: 1000 mL

## 2022-03-05 MED ORDER — LACTATED RINGERS IV SOLN: INTRAVENOUS | Status: DC

## 2022-03-05 MED ORDER — CYCLOBENZAPRINE HCL 10 MG PO TABS: 10.0000 mg | ORAL_TABLET | Freq: Three times a day (TID) | ORAL | 0 refills | Status: AC | PRN

## 2022-03-05 MED ORDER — ALBUTEROL SULFATE HFA 108 (90 BASE) MCG/ACT IN AERS
INHALATION_SPRAY | RESPIRATORY_TRACT | Status: AC
  Filled 2022-03-05: qty 6.7

## 2022-03-05 MED ORDER — CYCLOBENZAPRINE HCL 10 MG PO TABS
10.0000 mg | ORAL_TABLET | Freq: Three times a day (TID) | ORAL | Status: DC | PRN
  Administered 2022-03-05: 10 mg via ORAL
  Filled 2022-03-05: qty 1

## 2022-03-05 MED ORDER — PHENYLEPHRINE 80 MCG/ML (10ML) SYRINGE FOR IV PUSH (FOR BLOOD PRESSURE SUPPORT)
PREFILLED_SYRINGE | INTRAVENOUS | Status: DC | PRN
  Administered 2022-03-05: 160 ug via INTRAVENOUS

## 2022-03-05 MED ORDER — MENTHOL 3 MG MT LOZG: 1.0000 | LOZENGE | OROMUCOSAL | Status: DC | PRN

## 2022-03-05 MED ORDER — REMIFENTANIL HCL 1 MG IV SOLR
INTRAVENOUS | Status: DC | PRN
  Administered 2022-03-05: .1 ug/kg/min via INTRAVENOUS

## 2022-03-05 MED ORDER — PROMETHAZINE HCL 12.5 MG PO TABS: 12.5000 mg | ORAL_TABLET | Freq: Four times a day (QID) | ORAL | Status: DC | PRN

## 2022-03-05 MED ORDER — ORAL CARE MOUTH RINSE: 15.0000 mL | Freq: Once | OROMUCOSAL | Status: AC

## 2022-03-05 MED ORDER — BUPIVACAINE HCL (PF) 0.25 % IJ SOLN
INTRAMUSCULAR | Status: AC
  Filled 2022-03-05: qty 30

## 2022-03-05 MED ORDER — STERILE WATER FOR IRRIGATION IR SOLN
Status: DC | PRN
  Administered 2022-03-05: 1000 mL

## 2022-03-05 MED ORDER — VANCOMYCIN HCL IN DEXTROSE 1-5 GM/200ML-% IV SOLN
1000.0000 mg | Freq: Once | INTRAVENOUS | Status: AC
  Administered 2022-03-06: 1000 mg via INTRAVENOUS
  Filled 2022-03-05: qty 200

## 2022-03-05 MED ORDER — SUCCINYLCHOLINE CHLORIDE 200 MG/10ML IV SOSY
PREFILLED_SYRINGE | INTRAVENOUS | Status: DC | PRN
  Administered 2022-03-05: 100 mg via INTRAVENOUS

## 2022-03-05 MED ORDER — VANCOMYCIN HCL IN DEXTROSE 1-5 GM/200ML-% IV SOLN
1000.0000 mg | Freq: Once | INTRAVENOUS | Status: AC
  Administered 2022-03-05: 1000 mg via INTRAVENOUS

## 2022-03-05 MED ORDER — SCOPOLAMINE 1 MG/3DAYS TD PT72
1.0000 | MEDICATED_PATCH | TRANSDERMAL | Status: DC
  Administered 2022-03-05: 1.5 mg via TRANSDERMAL
  Filled 2022-03-05: qty 1

## 2022-03-05 MED ORDER — ALBUTEROL SULFATE HFA 108 (90 BASE) MCG/ACT IN AERS: 2.0000 | INHALATION_SPRAY | Freq: Four times a day (QID) | RESPIRATORY_TRACT | Status: DC | PRN

## 2022-03-05 MED ORDER — BUPROPION HCL ER (XL) 300 MG PO TB24
300.0000 mg | ORAL_TABLET | Freq: Every morning | ORAL | Status: DC
  Administered 2022-03-06: 300 mg via ORAL
  Filled 2022-03-05: qty 1

## 2022-03-05 MED ORDER — VANCOMYCIN HCL IN DEXTROSE 1-5 GM/200ML-% IV SOLN
INTRAVENOUS | Status: AC
  Administered 2022-03-05: 1000 mg via INTRAVENOUS
  Filled 2022-03-05: qty 200

## 2022-03-05 MED ORDER — LIDOCAINE IN D5W 4-5 MG/ML-% IV SOLN
2.0000 mg/min | INTRAVENOUS | Status: DC
  Filled 2022-03-05: qty 500

## 2022-03-05 MED ORDER — MIDAZOLAM HCL 2 MG/2ML IJ SOLN
INTRAMUSCULAR | Status: AC
  Filled 2022-03-05: qty 2

## 2022-03-05 MED ORDER — FLUOXETINE HCL 20 MG PO CAPS: 20.0000 mg | ORAL_CAPSULE | Freq: Every day | ORAL | Status: DC

## 2022-03-05 MED ORDER — CHLORHEXIDINE GLUCONATE 0.12 % MT SOLN
15.0000 mL | Freq: Once | OROMUCOSAL | Status: AC
  Administered 2022-03-05: 15 mL via OROMUCOSAL
  Filled 2022-03-05: qty 15

## 2022-03-05 MED ORDER — TAMSULOSIN HCL 0.4 MG PO CAPS
0.4000 mg | ORAL_CAPSULE | Freq: Every day | ORAL | Status: DC
  Administered 2022-03-05: 0.4 mg via ORAL
  Filled 2022-03-05: qty 1

## 2022-03-05 MED ORDER — THROMBIN 20000 UNITS EX SOLR
CUTANEOUS | Status: AC
  Filled 2022-03-05: qty 20000

## 2022-03-05 MED ORDER — LINACLOTIDE 72 MCG PO CAPS
72.0000 ug | ORAL_CAPSULE | Freq: Every day | ORAL | Status: DC
  Administered 2022-03-06: 72 ug via ORAL
  Filled 2022-03-05: qty 1

## 2022-03-05 MED ORDER — HYDROCODONE-ACETAMINOPHEN 10-325 MG PO TABS: 1.0000 | ORAL_TABLET | Freq: Four times a day (QID) | ORAL | 0 refills | Status: AC | PRN

## 2022-03-05 MED ORDER — HYDROMORPHONE HCL 1 MG/ML IJ SOLN
INTRAMUSCULAR | Status: DC | PRN
  Administered 2022-03-05 (×2): .5 mg via INTRAVENOUS

## 2022-03-05 MED ORDER — BUPIVACAINE HCL 0.25 % IJ SOLN
INTRAMUSCULAR | Status: DC | PRN
  Administered 2022-03-05: 10 mL

## 2022-03-05 MED ORDER — TRANEXAMIC ACID-NACL 1000-0.7 MG/100ML-% IV SOLN
1000.0000 mg | INTRAVENOUS | Status: AC
  Administered 2022-03-05: 1000 mg via INTRAVENOUS
  Filled 2022-03-05: qty 100

## 2022-03-05 MED ORDER — INSULIN ASPART 100 UNIT/ML FLEXPEN: 8.0000 [IU] | PEN_INJECTOR | Freq: Three times a day (TID) | SUBCUTANEOUS | Status: DC

## 2022-03-05 MED ORDER — LIDOCAINE 2% (20 MG/ML) 5 ML SYRINGE
INTRAMUSCULAR | Status: DC | PRN
  Administered 2022-03-05: 60 mg via INTRAVENOUS

## 2022-03-05 MED ORDER — PROPOFOL 1000 MG/100ML IV EMUL
INTRAVENOUS | Status: AC
  Filled 2022-03-05: qty 300

## 2022-03-05 MED ORDER — NITROGLYCERIN 0.4 MG SL SUBL: 0.4000 mg | SUBLINGUAL_TABLET | SUBLINGUAL | Status: DC | PRN

## 2022-03-05 MED ORDER — EPHEDRINE SULFATE-NACL 50-0.9 MG/10ML-% IV SOSY
PREFILLED_SYRINGE | INTRAVENOUS | Status: DC | PRN
  Administered 2022-03-05 (×2): 10 mg via INTRAVENOUS

## 2022-03-05 MED ORDER — FENTANYL CITRATE (PF) 250 MCG/5ML IJ SOLN
INTRAMUSCULAR | Status: DC | PRN
  Administered 2022-03-05 (×2): 100 ug via INTRAVENOUS
  Administered 2022-03-05 (×2): 50 ug via INTRAVENOUS

## 2022-03-05 MED ORDER — KETAMINE HCL 10 MG/ML IJ SOLN
INTRAMUSCULAR | Status: DC | PRN
  Administered 2022-03-05 (×2): 25 mg via INTRAVENOUS

## 2022-03-05 MED ORDER — SODIUM CHLORIDE 0.9 % IV SOLN: 12.5000 mg | Freq: Four times a day (QID) | INTRAVENOUS | Status: DC | PRN

## 2022-03-05 MED ORDER — SODIUM CHLORIDE 0.9 % IV SOLN: 250.0000 mL | INTRAVENOUS | Status: DC

## 2022-03-05 MED ORDER — FENTANYL CITRATE (PF) 250 MCG/5ML IJ SOLN
INTRAMUSCULAR | Status: AC
  Filled 2022-03-05: qty 5

## 2022-03-05 MED ORDER — PROPOFOL 10 MG/ML IV BOLUS
INTRAVENOUS | Status: DC | PRN
  Administered 2022-03-05: 150 mg via INTRAVENOUS

## 2022-03-05 MED ORDER — DEXMEDETOMIDINE HCL IN NACL 80 MCG/20ML IV SOLN
INTRAVENOUS | Status: DC | PRN
  Administered 2022-03-05 (×2): 10 ug via BUCCAL

## 2022-03-05 MED ORDER — HYDROMORPHONE HCL 1 MG/ML IJ SOLN
0.2500 mg | INTRAMUSCULAR | Status: DC | PRN
  Administered 2022-03-05 (×2): 0.5 mg via INTRAVENOUS

## 2022-03-05 MED ORDER — HYDROCODONE-ACETAMINOPHEN 10-325 MG PO TABS
2.0000 | ORAL_TABLET | ORAL | Status: DC | PRN
  Administered 2022-03-05 – 2022-03-06 (×2): 2 via ORAL
  Filled 2022-03-05 (×2): qty 2

## 2022-03-05 MED ORDER — HYDROXYZINE HCL 25 MG PO TABS
25.0000 mg | ORAL_TABLET | Freq: Once | ORAL | Status: AC
  Administered 2022-03-05: 25 mg via ORAL
  Filled 2022-03-05 (×2): qty 1

## 2022-03-05 MED ORDER — LISDEXAMFETAMINE DIMESYLATE 50 MG PO CAPS: 50.0000 mg | ORAL_CAPSULE | Freq: Every day | ORAL | Status: DC

## 2022-03-05 MED ORDER — KETAMINE HCL 50 MG/5ML IJ SOSY
PREFILLED_SYRINGE | INTRAMUSCULAR | Status: AC
  Filled 2022-03-05: qty 5

## 2022-03-05 MED ORDER — EPINEPHRINE PF 1 MG/ML IJ SOLN
INTRAMUSCULAR | Status: DC | PRN
  Administered 2022-03-05: .15 mL

## 2022-03-05 MED ORDER — INSULIN ASPART 100 UNIT/ML IJ SOLN
0.0000 [IU] | Freq: Three times a day (TID) | INTRAMUSCULAR | Status: DC
  Administered 2022-03-06: 5 [IU] via SUBCUTANEOUS
  Administered 2022-03-06: 2 [IU] via SUBCUTANEOUS

## 2022-03-05 MED ORDER — HYDROCODONE-ACETAMINOPHEN 7.5-325 MG PO TABS: 1.0000 | ORAL_TABLET | Freq: Once | ORAL | Status: DC | PRN

## 2022-03-05 MED ORDER — GABAPENTIN 300 MG PO CAPS
600.0000 mg | ORAL_CAPSULE | Freq: Three times a day (TID) | ORAL | Status: DC
  Administered 2022-03-05 – 2022-03-06 (×2): 600 mg via ORAL
  Filled 2022-03-05 (×2): qty 2

## 2022-03-05 MED ORDER — HYDROCODONE-ACETAMINOPHEN 5-325 MG PO TABS
1.0000 | ORAL_TABLET | ORAL | Status: DC | PRN
  Administered 2022-03-06: 1 via ORAL
  Filled 2022-03-05: qty 1

## 2022-03-05 MED ORDER — ALBUTEROL SULFATE (2.5 MG/3ML) 0.083% IN NEBU: 2.5000 mg | INHALATION_SOLUTION | Freq: Four times a day (QID) | RESPIRATORY_TRACT | Status: DC | PRN

## 2022-03-05 MED ORDER — EPINEPHRINE PF 1 MG/ML IJ SOLN
INTRAMUSCULAR | Status: AC
  Filled 2022-03-05: qty 1

## 2022-03-05 MED ORDER — REMIFENTANIL HCL 2 MG IV SOLR
INTRAVENOUS | Status: AC
  Filled 2022-03-05: qty 2000

## 2022-03-05 MED ORDER — CLONIDINE HCL ER 0.1 MG PO TB12
0.1000 mg | ORAL_TABLET | Freq: Every day | ORAL | Status: DC
  Filled 2022-03-05 (×2): qty 1

## 2022-03-05 MED ORDER — SODIUM CHLORIDE 0.9% FLUSH: 3.0000 mL | INTRAVENOUS | Status: DC | PRN

## 2022-03-05 MED ORDER — INSULIN ASPART 100 UNIT/ML IJ SOLN: 0.0000 [IU] | INTRAMUSCULAR | Status: DC | PRN

## 2022-03-05 MED ORDER — AMISULPRIDE (ANTIEMETIC) 5 MG/2ML IV SOLN: 10.0000 mg | Freq: Once | INTRAVENOUS | Status: DC | PRN

## 2022-03-05 MED ORDER — MIDAZOLAM HCL 2 MG/2ML IJ SOLN
INTRAMUSCULAR | Status: DC | PRN
  Administered 2022-03-05: 2 mg via INTRAVENOUS

## 2022-03-05 MED ORDER — INSULIN ASPART 100 UNIT/ML IJ SOLN: 0.0000 [IU] | Freq: Every day | INTRAMUSCULAR | Status: DC

## 2022-03-05 MED ORDER — VANCOMYCIN HCL IN DEXTROSE 1-5 GM/200ML-% IV SOLN: 1000.0000 mg | Freq: Once | INTRAVENOUS | Status: DC

## 2022-03-05 MED ORDER — SODIUM CHLORIDE 0.9% FLUSH: 3.0000 mL | Freq: Two times a day (BID) | INTRAVENOUS | Status: DC

## 2022-03-05 MED ORDER — FLEET ENEMA 7-19 GM/118ML RE ENEM: 1.0000 | ENEMA | Freq: Once | RECTAL | Status: DC | PRN

## 2022-03-05 MED ORDER — ACETAMINOPHEN 325 MG PO TABS: 650.0000 mg | ORAL_TABLET | ORAL | Status: DC | PRN

## 2022-03-05 MED ORDER — ROSUVASTATIN CALCIUM 20 MG PO TABS
40.0000 mg | ORAL_TABLET | Freq: Every day | ORAL | Status: DC
  Administered 2022-03-06: 40 mg via ORAL
  Filled 2022-03-05: qty 2

## 2022-03-05 MED ORDER — HYDROMORPHONE HCL 1 MG/ML IJ SOLN: 1.0000 mg | INTRAMUSCULAR | Status: DC | PRN

## 2022-03-05 SURGICAL SUPPLY — 62 items
BAG COUNTER SPONGE SURGICOUNT (BAG) ×2 IMPLANT
BLADE CLIPPER SURG (BLADE) IMPLANT
CAGE CERV MOD 7X17X14 7D (Cage) ×1 IMPLANT
CANISTER SUCT 3000ML PPV (MISCELLANEOUS) ×2 IMPLANT
CLSR STERI-STRIP ANTIMIC 1/2X4 (GAUZE/BANDAGES/DRESSINGS) ×2 IMPLANT
COVER MAYO STAND STRL (DRAPES) ×4 IMPLANT
COVER SURGICAL LIGHT HANDLE (MISCELLANEOUS) ×3 IMPLANT
DRAIN CHANNEL 15F RND FF W/TCR (WOUND CARE) IMPLANT
DRAPE C-ARM 42X72 X-RAY (DRAPES) ×2 IMPLANT
DRAPE C-ARMOR (DRAPES) IMPLANT
DRAPE POUCH INSTRU U-SHP 10X18 (DRAPES) ×2 IMPLANT
DRAPE SURG 17X23 STRL (DRAPES) ×2 IMPLANT
DRAPE U-SHAPE 47X51 STRL (DRAPES) ×2 IMPLANT
DRSG OPSITE POSTOP 3X4 (GAUZE/BANDAGES/DRESSINGS) ×1 IMPLANT
DRSG OPSITE POSTOP 4X6 (GAUZE/BANDAGES/DRESSINGS) ×1 IMPLANT
DURAPREP 26ML APPLICATOR (WOUND CARE) ×2 IMPLANT
ELECT COATED BLADE 2.86 ST (ELECTRODE) ×2 IMPLANT
ELECT NVM5 SURFACE MEP/EMG (ELECTRODE) ×1 IMPLANT
ELECT PENCIL ROCKER SW 15FT (MISCELLANEOUS) ×2 IMPLANT
ELECT REM PT RETURN 9FT ADLT (ELECTROSURGICAL) ×2
ELECTRODE REM PT RTRN 9FT ADLT (ELECTROSURGICAL) ×2 IMPLANT
GLOVE BIO SURGEON STRL SZ 6.5 (GLOVE) ×2 IMPLANT
GLOVE BIOGEL PI IND STRL 6.5 (GLOVE) ×2 IMPLANT
GLOVE BIOGEL PI IND STRL 8.5 (GLOVE) ×2 IMPLANT
GLOVE SS BIOGEL STRL SZ 8.5 (GLOVE) ×2 IMPLANT
GOWN STRL REUS W/ TWL LRG LVL3 (GOWN DISPOSABLE) ×4 IMPLANT
GOWN STRL REUS W/TWL 2XL LVL3 (GOWN DISPOSABLE) ×2 IMPLANT
GOWN STRL REUS W/TWL LRG LVL3 (GOWN DISPOSABLE) ×4
KIT BASIN OR (CUSTOM PROCEDURE TRAY) ×2 IMPLANT
KIT TURNOVER KIT B (KITS) ×2 IMPLANT
NDL 18GX1X1/2 (RX/OR ONLY) (NEEDLE) IMPLANT
NDL FILTER BLUNT 18X1 1/2 (NEEDLE) IMPLANT
NDL SPNL 18GX3.5 QUINCKE PK (NEEDLE) ×1 IMPLANT
NEEDLE 18GX1X1/2 (RX/OR ONLY) (NEEDLE) ×2 IMPLANT
NEEDLE FILTER BLUNT 18X1 1/2 (NEEDLE) ×2 IMPLANT
NEEDLE SPNL 18GX3.5 QUINCKE PK (NEEDLE) ×2 IMPLANT
NS IRRIG 1000ML POUR BTL (IV SOLUTION) ×2 IMPLANT
PACK ORTHO CERVICAL (CUSTOM PROCEDURE TRAY) ×2 IMPLANT
PACK UNIVERSAL I (CUSTOM PROCEDURE TRAY) ×2 IMPLANT
PAD ARMBOARD 7.5X6 YLW CONV (MISCELLANEOUS) ×4 IMPLANT
PIN DISTRACTION MAXCESS-C 14 (PIN) ×2 IMPLANT
PLATE ACP 1-LEVEL1.6V22 (Plate) ×1 IMPLANT
POSITIONER HEAD DONUT 9IN (MISCELLANEOUS) ×2 IMPLANT
PUTTY BONE DBX 2.5 MIS (Bone Implant) ×1 IMPLANT
RESTRAINT LIMB HOLDER UNIV (RESTRAINTS) ×2 IMPLANT
SCREW ACP VA SD 3.5X15 (Screw) ×4 IMPLANT
SPONGE INTESTINAL PEANUT (DISPOSABLE) ×1 IMPLANT
SPONGE SURGIFOAM ABS GEL SZ50 (HEMOSTASIS) ×2 IMPLANT
SURGIFLO W/THROMBIN 8M KIT (HEMOSTASIS) ×2 IMPLANT
SUT BONE WAX W31G (SUTURE) ×2 IMPLANT
SUT MNCRL AB 3-0 PS2 27 (SUTURE) ×2 IMPLANT
SUT SILK 3 0 TIES 17X18 (SUTURE) ×2
SUT SILK 3-0 18XBRD TIE BLK (SUTURE) ×1 IMPLANT
SUT VIC AB 2-0 CT1 18 (SUTURE) ×2 IMPLANT
SYR BULB IRRIG 60ML STRL (SYRINGE) ×2 IMPLANT
SYR CONTROL 10ML LL (SYRINGE) IMPLANT
SYR TB 1ML LUER SLIP (SYRINGE) ×1 IMPLANT
TAPE CLOTH 4X10 WHT NS (GAUZE/BANDAGES/DRESSINGS) ×2 IMPLANT
TAPE UMBILICAL COTTON 1/8X30 (MISCELLANEOUS) ×3 IMPLANT
TOWEL GREEN STERILE (TOWEL DISPOSABLE) ×2 IMPLANT
TOWEL GREEN STERILE FF (TOWEL DISPOSABLE) ×2 IMPLANT
WATER STERILE IRR 1000ML POUR (IV SOLUTION) ×2 IMPLANT

## 2022-03-05 NOTE — Progress Notes (Signed)
Pt is s/p cervical decompression without drain in place.   Vanc 1g IV x1  Onnie Boer, PharmD, [place], AAHIVP, CPP Infectious Disease Pharmacist 03/05/2022 9:22 PM

## 2022-03-05 NOTE — Anesthesia Procedure Notes (Signed)
Procedure Name: Intubation Date/Time: 03/05/2022 3:55 PM  Performed by: Elvin So, CRNAPre-anesthesia Checklist: Patient identified, Emergency Drugs available, Suction available and Patient being monitored Patient Re-evaluated:Patient Re-evaluated prior to induction Oxygen Delivery Method: Circle System Utilized Preoxygenation: Pre-oxygenation with 100% oxygen Induction Type: IV induction Ventilation: Mask ventilation without difficulty Laryngoscope Size: Glidescope Grade View: Grade I Tube type: Oral Tube size: 7.0 mm Number of attempts: 1 Airway Equipment and Method: Stylet and Oral airway Placement Confirmation: ETT inserted through vocal cords under direct vision, positive ETCO2 and breath sounds checked- equal and bilateral Secured at: 20 cm Tube secured with: Tape Dental Injury: Teeth and Oropharynx as per pre-operative assessment

## 2022-03-05 NOTE — Anesthesia Postprocedure Evaluation (Signed)
Anesthesia Post Note  Patient: MAISYN AARONSON  Procedure(s) Performed: ANTERIOR CERVICAL DECOMPRESSION/DISCECTOMY FUSION 1 LEVEL; C6-C7 (Spine Cervical)     Patient location during evaluation: PACU Anesthesia Type: General Level of consciousness: awake and alert Pain management: pain level controlled Vital Signs Assessment: post-procedure vital signs reviewed and stable Respiratory status: spontaneous breathing, nonlabored ventilation, respiratory function stable and patient connected to nasal cannula oxygen Cardiovascular status: blood pressure returned to baseline and stable Postop Assessment: no apparent nausea or vomiting Anesthetic complications: no   No notable events documented.  Last Vitals:  Vitals:   03/05/22 1930 03/05/22 1945  BP: (!) 181/92 (!) 160/90  Pulse: 88 87  Resp: 16 17  Temp:    SpO2: 99% 96%    Last Pain:  Vitals:   03/05/22 1945  TempSrc:   PainSc: Goodnight

## 2022-03-05 NOTE — Brief Op Note (Signed)
03/05/2022  6:40 PM  PATIENT:  Sandra [name]  47 y.o. 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

## 2022-03-05 NOTE — H&P (Signed)
History:  Sandra [name] is a very pleasant 47 year old woman who has cervical spondylitic myelopathy. As result of the failure of conservative management to improve her quality of life we elected to move forward with a C6-7 total disc arthroplasty.  Past Medical History:  Diagnosis Date   ADHD (attention deficit hyperactivity disorder)    Allergic rhinitis    Anemia    hx of   Anxiety    Arthritis    Barrett's esophagus    Bipolar 1 disorder (HCC)    Bladder tumor    CAD (coronary artery disease)    Cancer (HCC)    bladder   Constipation    Depression    Dilated cardiomyopathy ([place]) 12/20/2020   Echo 6/22 ([facility]): EF 43, mild MR, mild TR // Echo 3/22: EF 50-55   Dizziness    Full dentures    GERD (gastroesophageal reflux disease)    Gross hematuria    Hyperlipidemia    Hypertension    Stable angina    Type 2 diabetes mellitus (HCC)    Urgency of urination    dysuria, sui   Wears glasses     Allergies  Allergen Reactions   Ibuprofen Hives and Itching   Benazepril Hives   Diphenhydramine Hives   Latex Itching and Dermatitis    States she had redness to skin and itching    Metronidazole Nausea And Vomiting   Oxycodone     Pt has tolerated hydromorphone in the past and takes Norco at home.   Toradol [Ketorolac Tromethamine] Itching   Zofran [Ondansetron Hcl] Hives   Adhesive [Tape] Rash   Codeine Hives, Nausea And Vomiting and Rash   Loratadine Rash   Nystatin-Triamcinolone Rash   Penicillins Rash    Has patient had a PCN reaction causing immediate rash, facial/tongue/throat swelling, SOB or lightheadedness with hypotension: No Has patient had a PCN reaction causing severe rash involving mucus membranes or skin necrosis: Yes Has patient had a PCN reaction that required hospitalization: No Has patient had a PCN reaction occurring within the last 10 years: no  If all of the above answers are "NO", then may proceed with Cephalosporin use. Other reaction(s):  nausea Received ancef w/o issue (7/23)    No current facility-administered medications on file prior to encounter.   Current Outpatient Medications on File Prior to Encounter  Medication Sig Dispense Refill   albuterol (PROAIR HFA) 108 (90 Base) MCG/ACT inhaler Inhale 2 puffs into the lungs every 6 (six) hours as needed for wheezing or shortness of breath. 18 g 2   aspirin EC 81 MG tablet Take 1 tablet (81 mg total) by mouth daily with breakfast. Swallow whole. 30 tablet 11   benzonatate (TESSALON) 100 MG capsule Take 1 capsule (100 mg total) by mouth every 8 (eight) hours. 21 capsule 0   buPROPion (WELLBUTRIN XL) 300 MG 24 hr tablet Take 300 mg by mouth every morning.     cloNIDine HCl (KAPVAY) 0.1 MG TB12 ER tablet Take 0.1 mg by mouth at bedtime.     cyclobenzaprine (FLEXERIL) 10 MG tablet Take 1 tablet (10 mg total) by mouth 3 (three) times daily as needed for muscle spasms. 30 tablet 1   Evolocumab (REPATHA SURECLICK) [id number] MG/ML SOAJ Inject 1 Dose into the skin every 14 (fourteen) days. 2 mL 11   fenofibrate (TRICOR) 145 MG tablet Take 1 tablet (145 mg total) by mouth daily. 90 tablet 3   FLUoxetine (PROZAC) 20 MG capsule Take 20  mg by mouth at bedtime.     FLUoxetine (PROZAC) 40 MG capsule Take 40 mg by mouth in the morning.     gabapentin (NEURONTIN) 600 MG tablet Take 600 mg by mouth 3 (three) times daily.     HYDROcodone-acetaminophen (NORCO) 10-325 MG tablet Take 1 tablet by mouth every 6 (six) hours as needed for moderate pain.     insulin aspart (NOVOLOG) 100 UNIT/ML injection Use with Omnipod for TDD around 150 units daily 80 mL 6   Insulin Disposable Pump (OMNIPOD DASH PODS, GEN 4,) MISC Change pod every 48 hrs as directed 15 each 6   linaclotide (LINZESS) 72 MCG capsule Take 1 capsule (72 mcg total) by mouth daily before breakfast. 30 capsule 5   pantoprazole (PROTONIX) 40 MG tablet Take 1 tablet (40 mg total) by mouth 2 (two) times daily before a meal. 60 tablet 5    rosuvastatin (CRESTOR) 40 MG tablet Take 1 tablet (40 mg total) by mouth daily. 90 tablet 3   tamsulosin (FLOMAX) 0.4 MG CAPS capsule Take 1 capsule (0.4 mg total) by mouth at bedtime. 30 capsule 3   VYVANSE 50 MG capsule Take 50 mg by mouth daily.     ACCU-CHEK GUIDE test strip USE AS INSTRUCTED TO MONITOR GLUCOSE 4 TIMES DAILY BEFORE MEALS AND BEFORE BED. 400 strip 2   Accu-Chek Softclix Lancets lancets Use as instructed to monitor glucose 4 times daily 100 each 12   Blood Glucose Monitoring Suppl (ACCU-CHEK GUIDE) w/Device KIT 1 Piece by Does not apply route as directed. 1 kit 0   Blood Pressure Monitoring (BLOOD PRESSURE CUFF) MISC See admin instructions.     Continuous Blood Gluc Sensor (DEXCOM G6 SENSOR) MISC Apply new sensor every 10 days as directed 9 each 3   Continuous Blood Gluc Transmit (DEXCOM G6 TRANSMITTER) MISC 1 Device by Does not apply route every 3 (three) months. 3 each 1   GLOBAL EASE INJECT PEN NEEDLES 31G X 5 MM MISC Inject into the skin.     HYDROcodone-acetaminophen (NORCO) 5-325 MG tablet Take 1 tablet by mouth every 6 (six) hours as needed for moderate pain. (Patient not taking: Reported on 02/24/2022) 30 tablet 0   hydrOXYzine (ATARAX) 25 MG tablet Take 1 tablet (25 mg total) by mouth 3 (three) times daily as needed for itching or anxiety. (Patient not taking: Reported on 02/24/2022) 30 tablet 0   Insulin Pen Needle (PEN NEEDLES) 31G X 6 MM MISC Use to inject insulin 4 times daily 100 each 11   INSULIN SYRINGE 1CC/29G 29G X 1/2" 1 ML MISC Use to inject insulin into Omnipod 100 each 6   promethazine (PHENERGAN) 12.5 MG tablet Take 1 tablet (12.5 mg total) by mouth every 8 (eight) hours as needed for nausea or vomiting. (Patient not taking: Reported on 02/24/2022) 20 tablet 0    Physical Exam: Vitals:   03/05/22 1214  BP: (!) 155/92  Pulse: 80  Resp: 18  Temp: 98.2 F (36.8 C)  SpO2: 96%   Body mass index is 34.37 kg/m. Clinical exam: Sandra [name] is a pleasant  individual, who appears younger than their stated age.  She is alert and orientated 3.  No shortness of breath, chest pain.  Abdomen is soft and non-tender, negative loss of bowel and bladder control, no rebound tenderness.  Negative: skin lesions abrasions contusions  Peripheral pulses: 2+ peripheral pulses bilaterally upper extremity. LE compartments are: Soft and nontender.  Gait pattern: Unable to heel toe ambulate. Positive ataxic  gait pattern. Positive Romberg's test.  Assistive devices: None  Neuro: Positive Hoffman test, Babinski test, Lhermitte sign. No clonus. Brisk deep tendon reflexes in the lower and upper extremity bilaterally. Negative inverted brachioradialis reflex. 5/5 motor strength in the upper and lower extremity bilaterally. Negative Spurling sign. Positive numbness and dysesthesias in the C7 and C8 dermatome bilaterally.  Musculoskeletal: Moderate neck pain with palpation and range of motion. Occasional crepitus and occipital headaches are noted.  Imaging: X-rays of the cervical spine demonstrate degenerative disc changes at C4-5 and C6-7. No fractures noted. Facet joints are still well aligned.  Cervical MRI: completed on 01/21/2022. There is some motion artifact but there are cord signal changes at the C6-7 level. There is a large central disc herniation causing stenosis and cord compression. There is a broad-based disc protrusion at C4-5 producing mild to moderate foraminal stenosis and only mild central stenosis. The remainder of the levels are unremarkable.   A/P: Summary: Sandra [name] is a very pleasant 47 year old woman with multiple medical issues. She has a history of diabetes and cardiovascular disease. She states that over the last several months she has noted progressive difficulty maintaining her balance, she has had multiple falls, and she has had progressive numbness and dysesthesias into her hands. She was referred to me for further workup and  treatment. Patient's clinical exam is consistent with cervical spondylitic myelopathy. I have gone over her imaging studies as well as her clinical exam and all of her and her friend's questions were addressed. We discussed how the natural history of myelopathy is 1 of progression and the primary goal of surgical treatment is to prevent symptoms from getting worse. There is the opportunity for improvement but I have stressed the primary goal is to prevent her from getting worse. Planned surgical procedure would be a total disc arthroplasty at C6-7. This would allow for removal of the central disc herniation and maintaining overall motion. I have given her some reading material and all of her questions were addressed. Risks and benefits of surgery were discussed with the patient. These include: Infection, bleeding, death, stroke, paralysis, ongoing or worse pain, need for additional surgery, nonunion, leak of spinal fluid, adjacent segment degeneration requiring additional fusion surgery. Pseudoarthrosis (nonunion)requiring supplemental posterior fixation. Throat pain, swallowing difficulties, hoarseness or change in voice. Heterotopic ossification, inability to place the disc due to technical issues requiring bailout to a fusion procedure.

## 2022-03-05 NOTE — Transfer of Care (Signed)
Immediate Anesthesia Transfer of Care Note  Patient: Sandra [name]  Procedure(s) Performed: ANTERIOR CERVICAL DECOMPRESSION/DISCECTOMY FUSION 1 LEVEL; C6-C7 (Spine Cervical)  Patient Location: PACU  Anesthesia Type:General  Level of Consciousness: sedated and drowsy  Airway & Oxygen Therapy: Patient Spontanous Breathing and Patient connected to face mask oxygen  Post-op Assessment: Report given to RN, Post -op Vital signs reviewed and stable, and Patient moving all extremities  Post vital signs: Reviewed and stable  Last Vitals:  Vitals Value Taken Time  BP 163/82 03/05/22 1848  Temp    Pulse 93 03/05/22 1854  Resp 18 03/05/22 1854  SpO2 94 % 03/05/22 1854  Vitals shown include unvalidated device data.  Last Pain:  Vitals:   03/05/22 1243  TempSrc:   PainSc: 8          Complications: No notable events documented.

## 2022-03-05 NOTE — Discharge Instructions (Signed)

## 2022-03-05 NOTE — Op Note (Addendum)
OPERATIVE REPORT  DATE OF SURGERY: 03/05/2022  PATIENT NAME:  Sandra [name] MRN: [id number] DOB: [birth year]  PCP: Coolidge Breeze, FNP  PRE-OPERATIVE DIAGNOSIS: Cervical disc herniation with myeloradiculopathy C6-7  POST-OPERATIVE DIAGNOSIS: Same  PROCEDURE:   ACDF C6-7  SURGEON:  Melina Schools, MD  PHYSICIAN ASSISTANT: None  ANESTHESIA:   General  EBL: 75 ml   Complications: Patient was initially consented for a total disc replacement at C6-7.  Intraoperatively there was still a small fragment of disc material still very adherent to the thecal sac.  Despite an adequate discectomy and decompression I felt as though the disc replacement may potentially lead to increased irritation to the spinal cord and so I elected to proceed with a ACDF.  This complication was discussed in depth with the patient prior to surgery.  Implants: NuVasive 22 mm length ACP plate fixed with 15 mm locking screws.  7 mm medium lordotic NuVasive cage  Graft: DBX mix  BRIEF HISTORY: Sandra [name] is a 47 y.o. female who presented to my office with significant neck and radicular arm pain along with signs and symptoms of myelopathy.  Imaging studies confirmed cord compression at C6-7 and so we elected to move forward with surgery.  All appropriate risks, benefits, and alternatives were discussed with the patient and consent was obtained.  PROCEDURE DETAILS: Patient was brought into the operating room and was properly positioned on the operating room table.  After induction with general anesthesia the patient was endotracheally intubated.  A timeout was taken to confirm all important data: including patient, procedure, and the level. Teds, SCD's were applied.   The anterior cervical spine was prepped and draped in a standard fashion.  Using fluoroscopy I marked out the C6-7 disc space.  I then infiltrated the transverse incision site with quarter percent Marcaine with epinephrine.  Transverse incision was made  starting in the midline proceeded to the left and sharp dissection was carried out down to the platysma.  Platysma was sharply incised and I began dissecting along the medial border the sternocleidomastoid.  I sharply dissected down through the avascular plane performing a standard Smith-Robinson approach to the cervical spine.  Once I was able to palpate the anterior longitudinal ligament hand-held retractor was placed to sweep the esophagus and trachea to the right.  Using [employer] I completed my dissection to expose the C6-7 disc space.  A needle was placed into the disc and an intraoperative x-ray was taken confirming I was at the appropriate level.  I mobilized the longus coli muscles with the bipolar and then placed the appropriate size Caspar retractor blades underneath the longus coli muscle and then deflated the endotracheal cuff.  I explained the retractors to the appropriate width and then reinflated the endotracheal cuff.  An annulotomy was then performed with a 15 blade scalpel and I remove the bulk of the disc material with pituitary rongeurs.  I then trimmed down the leading edge of the inferior aspect of the C6 vertebral body to better visualize the disc space.  Using AP fluoroscopy I marked out the midline of the C6 and C7 vertebral body and placed my distraction pins.  I gently distracted the intervertebral space and maintain this with the distraction pin set.  I continued performing my discectomy using curettes and Kerrison rongeurs.  Once I was down posteriorly I was able to use my fine nerve hook to mobilize 2 large fragments of disc material that were central.  I then was  able to create a plane with a fine nerve hook between the posterior longitudinal ligament and the thecal sac as able to use my 1 mm Kerrison from the midline over to the left to resect the PLL.  At this point there was still a small bone spur that I was able to gently mobilize but was still somewhat adherent to the  thecal sac slightly to the right of midline.  I was able to dissect underneath the uncovertebral joint and further decompress the foramen at this level.  Despite prolonged attempt with the following nerve hook and other maneuvers to gently mobilize the small fragment of bone I was unable to completely remove it.  At this point I became concerned about placing the disc arthroplasty with the retained motion of the disc arthroplasty I was concerned that this small piece of bone could migrate and create additional compression.  This was not going to be an issue with the fusion.  In addition I was less than satisfied with the position of the implant with the trials.  The result I elected to move forward with the fusion.  The endplates were rasped and the trial ACDF cages were placed in the 7 mm medium was selected to be used.  This was packed with the allograft and malleted into position.  We had excellent fixation.  The distraction pins were removed and the resulting holes were sealed with bone wax.  A 22 mm ACP plate was contoured and affixed to the bodies of C6 and C7 with 15 mm locking screws.  All 4 screws had excellent purchase.  The locking device to prevent screw backout was then engaged per manufacture standards.  The wound was now copiously irrigated with normal saline and I confirmed hemostasis with bipolar electrocautery and Floseal.  After final irrigation I made sure the trach and esophagus were returned to midline and I closed the platysma with interrupted 2-0 Vicryl sutures.  The skin was then closed with 3-0 Monocryl.  Steri-Strips and a dry dressing and Aspen collar were applied.  Patient was ultimately extubated and transferred the PACU without incident.  The end of the case all needle sponge counts were correct.  Melina Schools, MD 03/05/2022 6:31 PM

## 2022-03-05 NOTE — Progress Notes (Signed)
Orthopedic Tech Progress Note Patient Details:  Sandra [name] [birth year] [id number]  Ortho Devices Type of Ortho Device: Aspen cervical collar Ortho Device/Splint Interventions: Ordered     Aspen dropped off with Therapist, sports at [employer]. Vernona Rieger 03/05/2022, 6:35 PM

## 2022-03-06 ENCOUNTER — Encounter (HOSPITAL_COMMUNITY): Payer: Self-pay | Admitting: Orthopedic Surgery

## 2022-03-06 DIAGNOSIS — M50023 Cervical disc disorder at C6-C7 level with myelopathy: Secondary | ICD-10-CM | POA: Diagnosis not present

## 2022-03-06 LAB — GLUCOSE, CAPILLARY
Glucose-Capillary: 204 mg/dL — ABNORMAL HIGH (ref 70–99)
Glucose-Capillary: 138 mg/dL — ABNORMAL HIGH (ref 70–99)

## 2022-03-06 NOTE — Evaluation (Signed)
Physical Therapy Evaluation Patient Details Name: Sandra [name] MRN: [id number] DOB: Nov 15, 1975 Today's Date: 03/06/2022  History of Present Illness  Pt is a 47 y.o. female s/p ACDF. PMH: ADHD, anxiety, OA, bipolar, depression, HTN, DM2, CAD  Clinical Impression  PT eval complete. Pt required min guard assist transfers, min guard assist amb 200' with SPC, and min guard assist ascend/descend 2 steps. Pt educated on cervical precautions and brace wear schedule. Pt without further questions or concerns regarding mobility. She reports having needed level of assist at home from family. No further skilled PT intervention indicated. PT signing off.        Recommendations for follow up therapy are one component of a multi-disciplinary discharge planning process, led by the attending physician.  Recommendations may be updated based on patient status, additional functional criteria and insurance authorization.  Follow Up Recommendations No PT follow up      Assistance Recommended at Discharge PRN  Patient can return home with the following  A little help with bathing/dressing/bathroom;A little help with walking and/or transfers;Help with stairs or ramp for entrance;Assist for transportation;Assistance with cooking/housework    Dispensing optician  Recommendations for Other Services       Functional Status Assessment Patient has had a recent decline in their functional status and demonstrates the ability to make significant improvements in function in a reasonable and predictable amount of time.     Precautions / Restrictions Precautions Precautions: Fall;Cervical Precaution Comments: Educated on cervical precautions and brace wear schedule. Handout provided. Required Braces or Orthoses: Cervical Brace Cervical Brace: Hard collar;Other (comment) (when OOB)      Mobility  Bed Mobility Overal bed mobility: Modified Independent             General bed mobility comments:  increased time, +rail, HOB at 15 degrees    Transfers Overall transfer level: Needs assistance Equipment used: Ambulation equipment used Transfers: Sit to/from Stand Sit to Stand: Min guard           General transfer comment: min guard for safety, sit to stand from EOB and toilet    Ambulation/Gait Ambulation/Gait assistance: Min guard Gait Distance (Feet): 200 Feet Assistive device: Straight cane Gait Pattern/deviations: Step-through pattern, Decreased stride length Gait velocity: decreased Gait velocity interpretation: <1.31 ft/sec, indicative of household ambulator   General Gait Details: mildly unsteady but no overt LOB, min guard assist for safety  Stairs Stairs: Yes Stairs assistance: Min guard Stair Management: One rail Left, With cane, Forwards, Step to pattern Number of Stairs: 2    Wheelchair Mobility    Modified Rankin (Stroke Patients Only)       Balance Overall balance assessment: Needs assistance Sitting-balance support: No upper extremity supported, Feet unsupported Sitting balance-Leahy Scale: Good     Standing balance support: Single extremity supported, No upper extremity supported, During functional activity Standing balance-Leahy Scale: Fair                               Pertinent Vitals/Pain Pain Assessment Pain Assessment: 0-10 Pain Score: 10-Worst pain ever Pain Location: neck Pain Descriptors / Indicators: Discomfort, Grimacing, Sore Pain Intervention(s): Monitored during session, Repositioned    Home Living Family/patient expects to be discharged to:: Private residence Living Arrangements: Children Available Help at Discharge: Family;Available 24 hours/day Type of Home: Mobile home Home Access: Stairs to enter   Entrance Stairs-Number of Steps: 1 (threshold)   Home Layout: One level Home Equipment:  None      Prior Function Prior Level of Function : Independent/Modified Independent;History of Falls (last six  months);Driving             Mobility Comments: reports frequent falls ADLs Comments: mod I     Hand Dominance        Extremity/Trunk Assessment   Upper Extremity Assessment Upper Extremity Assessment: Defer to OT evaluation    Lower Extremity Assessment Lower Extremity Assessment: Generalized weakness (Pt reports numbness bilat feet due to neuropathy.)    Cervical / Trunk Assessment Cervical / Trunk Assessment: Neck Surgery  Communication      Cognition Arousal/Alertness: Awake/alert Behavior During Therapy: WFL for tasks assessed/performed Overall Cognitive Status: Within Functional Limits for tasks assessed                                          General Comments      Exercises     Assessment/Plan    PT Assessment Patient does not need any further PT services  PT Problem List         PT Treatment Interventions      PT Goals (Current goals can be found in the Care Plan section)  Acute Rehab PT Goals Patient Stated Goal: home, improve numbness R hand PT Goal Formulation: All assessment and education complete, DC therapy    Frequency       Co-evaluation               AM-PAC PT "6 Clicks" Mobility  Outcome Measure Help needed turning from your back to your side while in a flat bed without using bedrails?: None Help needed moving from lying on your back to sitting on the side of a flat bed without using bedrails?: A Little Help needed moving to and from a bed to a chair (including a wheelchair)?: A Little Help needed standing up from a chair using your arms (e.g., wheelchair or bedside chair)?: A Little Help needed to walk in hospital room?: A Little Help needed climbing 3-5 steps with a railing? : A Little 6 Click Score: 19    End of Session Equipment Utilized During Treatment: Gait belt;Cervical collar Activity Tolerance: Patient tolerated treatment well Patient left: in bed;with call bell/phone within reach Nurse  Communication: Mobility status PT Visit Diagnosis: Difficulty in walking, not elsewhere classified (R26.2)    Time: [id number] PT Time Calculation (min) (ACUTE ONLY): 36 min   Charges:   PT Evaluation $PT Eval Moderate Complexity: 1 Mod PT Treatments $Gait Training: 8-22 mins        Gloriann Loan., PT  Office # [phone]   Lorriane Shire 03/06/2022, 8:38 AM

## 2022-03-06 NOTE — Evaluation (Signed)
Occupational Therapy Evaluation Patient Details Name: Sandra [name] MRN: [id number] DOB: 03-May-1975 Today's Date: 03/06/2022   History of Present Illness Pt is a 47 y.o. female s/p ACDF. PMH: ADHD, anxiety, OA, bipolar, depression, HTN, DM2, CAD   Clinical Impression   PTA patient independent and driving.  Admitted for above and presents with problem list below.  She is very lethargic today and requires cueing to maintain eyes open during session- anticipate pain medication related. She has decreased sensation and coordination in [place] (R worse than L) but functional. She demonstrates ability to complete ADLs and functional mobility with up to min guard assist using cane, increased time required.  Pt will have support of sister in law and daughter at home.  Believe she will best benefit from follow outpatient OT after initial recovery, pt reports she has an appointment already scheduled. Will follow acutely but anticipate no further needs after dc home.      Recommendations for follow up therapy are one component of a multi-disciplinary discharge planning process, led by the attending physician.  Recommendations may be updated based on patient status, additional functional criteria and insurance authorization.   Follow Up Recommendations  Follow physician's recommendations for discharge plan and follow up therapies (outpatient OT when cleared by MD)     Assistance Recommended at Discharge Frequent or constant Supervision/Assistance  Patient can return home with the following A little help with walking and/or transfers;A little help with bathing/dressing/bathroom;Assistance with cooking/housework;Assist for transportation;Help with stairs or ramp for entrance    Functional Status Assessment  Patient has had a recent decline in their functional status and demonstrates the ability to make significant improvements in function in a reasonable and predictable amount of time.  Equipment  Recommendations  Tub/shower seat    Recommendations for Other Services       Precautions / Restrictions Precautions Precautions: Fall;Cervical Precaution Booklet Issued: Yes (comment) Precaution Comments: Educated on cervical precautions and brace wear schedule. Handout provided. Required Braces or Orthoses: Cervical Brace Cervical Brace: Hard collar;Other (comment) Restrictions Weight Bearing Restrictions: No      Mobility Bed Mobility Overal bed mobility: Modified Independent             General bed mobility comments: incraeased time and using rail.    Transfers Overall transfer level: Needs assistance Equipment used: None, Straight cane Transfers: Sit to/from Stand Sit to Stand: Min guard, Supervision           General transfer comment: min guard fading to supervision with cane      Balance Overall balance assessment: Needs assistance Sitting-balance support: No upper extremity supported, Feet unsupported Sitting balance-Leahy Scale: Good     Standing balance support: Single extremity supported, No upper extremity supported, During functional activity Standing balance-Leahy Scale: Fair                             ADL either performed or assessed with clinical judgement   ADL Overall ADL's : Needs assistance/impaired     Grooming: Supervision/safety;Wash/dry hands;Standing           Upper Body Dressing : Set up;Sitting   Lower Body Dressing: Min guard;Sit to/from stand   Toilet Transfer: Min guard;Ambulation Toilet Transfer Details (indicate cue type and reason): cane Toileting- Clothing Manipulation and Hygiene: Supervision/safety;Sit to/from stand       Functional mobility during ADLs: Min guard;Supervision/safety;Cane General ADL Comments: min guard to close supervision for functional mobility.  educated  on compensatory techniques and cervical precautions.  patient very lethargic     Vision   Vision Assessment?: No  apparent visual deficits Additional Comments: cueing to keep eyes open     Perception     Praxis      Pertinent Vitals/Pain Pain Assessment Pain Assessment: Faces Faces Pain Scale: Hurts a little bit Pain Location: neck Pain Descriptors / Indicators: Discomfort, Grimacing, Sore Pain Intervention(s): Limited activity within patient's tolerance, Monitored during session, Repositioned     Hand Dominance Left   Extremity/Trunk Assessment Upper Extremity Assessment Upper Extremity Assessment: RUE deficits/detail;LUE deficits/detail RUE Deficits / Details: reports numbness in UE, grossly 3/5 MMT and decreased coordination RUE Sensation: decreased light touch RUE Coordination: decreased fine motor;decreased gross motor LUE Deficits / Details: tingling in fingers, grossly 3+/5 MMT but decreased coordination LUE Sensation: decreased light touch LUE Coordination: decreased fine motor   Lower Extremity Assessment Lower Extremity Assessment: Defer to PT evaluation   Cervical / Trunk Assessment Cervical / Trunk Assessment: Neck Surgery   Communication Communication Communication: No difficulties   Cognition Arousal/Alertness: Lethargic, Suspect due to medications Behavior During Therapy: Flat affect Overall Cognitive Status: Within Functional Limits for tasks assessed                                 General Comments: pt with slow processing and lethargic, anticipate pain medication related     General Comments  RN notified of pt concerns with unable to void urine    Exercises     Shoulder Instructions      Home Living Family/patient expects to be discharged to:: Private residence Living Arrangements: Children Available Help at Discharge: Family;Available 24 hours/day Type of Home: Mobile home Home Access: Stairs to enter Entrance Stairs-Number of Steps: 1 (threshold)   Home Layout: One level     Bathroom Shower/Tub: Teacher, early years/pre:  Standard     Home Equipment: None          Prior Functioning/Environment Prior Level of Function : Independent/Modified Independent;History of Falls (last six months);Driving             Mobility Comments: reports frequent falls ADLs Comments: independent and "managing". drives but does not work        OT Problem List: Decreased strength;Decreased activity tolerance;Impaired balance (sitting and/or standing);Pain;Impaired UE functional use;Impaired sensation;Decreased knowledge of precautions;Decreased knowledge of use of DME or AE;Decreased safety awareness;Decreased coordination      OT Treatment/Interventions: Self-care/ADL training;Neuromuscular education;DME and/or AE instruction;Therapeutic activities;Cognitive remediation/compensation;Balance training;Patient/family education    OT Goals(Current goals can be found in the care plan section) Acute Rehab OT Goals Patient Stated Goal: home OT Goal Formulation: With patient Time For Goal Achievement: 03/20/22 Potential to Achieve Goals: Good  OT Frequency: Min 2X/week    Co-evaluation              AM-PAC OT "6 Clicks" Daily Activity     Outcome Measure Help from another person eating meals?: None Help from another person taking care of personal grooming?: A Little Help from another person toileting, which includes using toliet, bedpan, or urinal?: A Little Help from another person bathing (including washing, rinsing, drying)?: A Little Help from another person to put on and taking off regular upper body clothing?: A Little Help from another person to put on and taking off regular lower body clothing?: A Little 6 Click Score: 19   End of Session  Equipment Utilized During Treatment: Cervical collar Nurse Communication: Mobility status  Activity Tolerance: Patient limited by lethargy Patient left: in bed;with call bell/phone within reach  OT Visit Diagnosis: Other abnormalities of gait and mobility  (R26.89);Muscle weakness (generalized) (M62.81);Pain;History of falling (Z91.81) Pain - part of body:  (neck)                Time: [id number] OT Time Calculation (min): 23 min Charges:  OT General Charges $OT Visit: 1 Visit OT Evaluation $OT Eval Moderate Complexity: 1 Mod  Jolaine Artist, OT Acute Rehabilitation Services Office [phone]   Delight Stare 03/06/2022, 10:22 AM

## 2022-03-06 NOTE — Discharge Summary (Signed)
Patient ID: Sandra [name] MRN: [id number] DOB/AGE: Jun 09, 1975 47 y.o.  Admit date: 03/05/2022 Discharge date: 03/06/2022  Admission Diagnoses:  Principal Problem:   Cervical myelopathy [facility])   Discharge Diagnoses:  Principal Problem:   Cervical myelopathy ([place])  status post Procedure(s): ANTERIOR CERVICAL DECOMPRESSION/DISCECTOMY FUSION 1 LEVEL; C6-C7  Past Medical History:  Diagnosis Date   ADHD (attention deficit hyperactivity disorder)    Allergic rhinitis    Anemia    hx of   Anxiety    Arthritis    Barrett's esophagus    Bipolar 1 disorder (HCC)    Bladder tumor    CAD (coronary artery disease)    Cancer ([place])    bladder   Constipation    Depression    Dilated cardiomyopathy ([place]) 12/20/2020   Echo 6/22 ([facility]): EF 43, mild MR, mild TR // Echo 3/22: EF 50-55   Dizziness    Full dentures    GERD (gastroesophageal reflux disease)    Gross hematuria    Hyperlipidemia    Hypertension    Stable angina    Type 2 diabetes mellitus ([place])    Urgency of urination    dysuria, sui   Wears glasses     Surgeries: Procedure(s): ANTERIOR CERVICAL DECOMPRESSION/DISCECTOMY FUSION 1 LEVEL; C6-C7 on 03/05/2022   Consultants:   Discharged Condition: Improved  Hospital Course: Sandra [name] is an 47 y.o. female who was admitted 03/05/2022 for operative treatment of Cervical myelopathy ([place]). Patient failed conservative treatments (please see the history and physical for the specifics) and had severe unremitting pain that affects sleep, daily activities and work/hobbies. After pre-op clearance, the patient was taken to the operating room on 03/05/2022 and underwent  Procedure(s): ANTERIOR CERVICAL DECOMPRESSION/DISCECTOMY FUSION 1 LEVEL; C6-C7.    Patient was given perioperative antibiotics:  Anti-infectives (From admission, onward)    Start     Dose/Rate Route Frequency Ordered Stop   03/06/22 0500  vancomycin (VANCOCIN) IVPB 1000 mg/200 mL premix        1,000 mg 200  mL/hr over 60 Minutes Intravenous  Once 03/05/22 2124 03/06/22 0448   03/05/22 2215  vancomycin (VANCOCIN) IVPB 1000 mg/200 mL premix  Status:  Discontinued        1,000 mg 200 mL/hr over 60 Minutes Intravenous  Once 03/05/22 2118 03/05/22 2124   03/05/22 1515  vancomycin (VANCOCIN) IVPB 1000 mg/200 mL premix        1,000 mg 200 mL/hr over 60 Minutes Intravenous  Once 03/05/22 1510 03/05/22 1646        Patient was given sequential compression devices and early ambulation to prevent DVT.   Patient benefited maximally from hospital stay and there were no complications. At the time of discharge, the patient was urinating/moving their bowels without difficulty, tolerating a regular diet, pain is controlled with oral pain medications and they have been cleared by PT/OT.   Recent vital signs: Patient Vitals for the past 24 hrs:  BP Temp Temp src Pulse Resp SpO2 Height Weight  03/06/22 0448 130/88 98.5 F (36.9 C) Oral 98 20 95 % -- --  03/05/22 2332 (!) 147/88 98.2 F (36.8 C) Oral 91 20 97 % -- --  03/05/22 2140 (!) 143/85 98 F (36.7 C) Oral 100 20 93 % -- --  03/05/22 2100 (!) 147/98 -- -- 94 17 95 % -- --  03/05/22 2045 (!) 164/88 -- -- 92 15 94 % -- --  03/05/22 2030 (!) 166/94 -- -- 92 16 93 % -- --  03/05/22 2015 (!) 168/90 -- -- 89 14 94 % -- --  03/05/22 2000 (!) 175/91 -- -- 89 17 96 % -- --  03/05/22 1945 (!) 160/90 -- -- 87 17 96 % -- --  03/05/22 1930 (!) 181/92 -- -- 88 16 99 % -- --  03/05/22 1915 (!) 189/91 -- -- 91 19 99 % -- --  03/05/22 1900 (!) 189/91 -- -- 95 19 98 % -- --  03/05/22 1848 (!) 163/82 98.4 F (36.9 C) -- 85 15 93 % -- --  03/05/22 1214 (!) 155/92 98.2 F (36.8 C) Oral 80 18 96 % 5' (1.524 m) 79.8 kg     Recent laboratory studies: No results for input(s): "WBC", "HGB", "HCT", "PLT", "NA", "K", "CL", "CO2", "BUN", "CREATININE", "GLUCOSE", "INR", "CALCIUM" in the last 72 hours.  Invalid input(s): "PT", "2"   Discharge Medications:   Allergies as  of 03/06/2022       Reactions   Ibuprofen Hives, Itching   Benazepril Hives   Diphenhydramine Hives   Latex Itching, Dermatitis   States she had redness to skin and itching    Metronidazole Nausea And Vomiting   Oxycodone    Pt has tolerated hydromorphone in the past and takes Norco at home.   Toradol [ketorolac Tromethamine] Itching   Zofran [ondansetron Hcl] Hives   Adhesive [tape] Rash   Codeine Hives, Nausea And Vomiting, Rash   Loratadine Rash   Nystatin-triamcinolone Rash   Penicillins Rash   Has patient had a PCN reaction causing immediate rash, facial/tongue/throat swelling, SOB or lightheadedness with hypotension: No Has patient had a PCN reaction causing severe rash involving mucus membranes or skin necrosis: Yes Has patient had a PCN reaction that required hospitalization: No Has patient had a PCN reaction occurring within the last 10 years: no If all of the above answers are "NO", then may proceed with Cephalosporin use. Other reaction(s): nausea Received ancef w/o issue (7/23)        Medication List     STOP taking these medications    hydrOXYzine 25 MG tablet Commonly known as: ATARAX   promethazine 12.5 MG tablet Commonly known as: PHENERGAN       TAKE these medications    Accu-Chek Guide test strip Generic drug: glucose blood USE AS INSTRUCTED TO MONITOR GLUCOSE 4 TIMES DAILY BEFORE MEALS AND BEFORE BED.   Accu-Chek Guide w/Device Kit 1 Piece by Does not apply route as directed.   Accu-Chek Softclix Lancets lancets Use as instructed to monitor glucose 4 times daily   albuterol 108 (90 Base) MCG/ACT inhaler Commonly known as: ProAir HFA Inhale 2 puffs into the lungs every 6 (six) hours as needed for wheezing or shortness of breath.   aspirin EC 81 MG tablet Take 1 tablet (81 mg total) by mouth daily with breakfast. Swallow whole.   benzonatate 100 MG capsule Commonly known as: TESSALON Take 1 capsule (100 mg total) by mouth every 8 (eight)  hours.   Blood Pressure Cuff Misc See admin instructions.   buPROPion 300 MG 24 hr tablet Commonly known as: WELLBUTRIN XL Take 300 mg by mouth every morning.   cloNIDine HCl 0.1 MG Tb12 ER tablet Commonly known as: KAPVAY Take 0.1 mg by mouth at bedtime.   cyclobenzaprine 10 MG tablet Commonly known as: FLEXERIL Take 1 tablet (10 mg total) by mouth 3 (three) times daily as needed for muscle spasms. What changed: Another medication with the same name was added. Make sure you understand  how and when to take each.   cyclobenzaprine 10 MG tablet Commonly known as: FLEXERIL Take 1 tablet (10 mg total) by mouth 3 (three) times daily as needed for muscle spasms. What changed: You were already taking a medication with the same name, and this prescription was added. Make sure you understand how and when to take each.   Dexcom G6 Sensor Misc Apply new sensor every 10 days as directed   Dexcom G6 Transmitter Misc 1 Device by Does not apply route every 3 (three) months.   fenofibrate 145 MG tablet Commonly known as: TRICOR Take 1 tablet (145 mg total) by mouth daily.   FLUoxetine 40 MG capsule Commonly known as: PROZAC Take 40 mg by mouth in the morning.   FLUoxetine 20 MG capsule Commonly known as: PROZAC Take 20 mg by mouth at bedtime.   gabapentin 600 MG tablet Commonly known as: NEURONTIN Take 600 mg by mouth 3 (three) times daily.   HYDROcodone-acetaminophen 10-325 MG tablet Commonly known as: Norco Take 1 tablet by mouth every 6 (six) hours as needed for up to 5 days for moderate pain (pain). What changed:  reasons to take this Another medication with the same name was removed. Continue taking this medication, and follow the directions you see here.   insulin aspart 100 UNIT/ML injection Commonly known as: NovoLOG Use with Omnipod for TDD around 150 units daily   NovoLOG FlexPen 100 UNIT/ML FlexPen Generic drug: insulin aspart Inject 8-14 Units into the skin 3  (three) times daily with meals.   INSULIN SYRINGE 1CC/29G 29G X 1/2" 1 ML Misc Use to inject insulin into Omnipod   Lantus SoloStar 100 UNIT/ML Solostar Pen Generic drug: insulin glargine Inject 35 Units into the skin at bedtime.   linaclotide 72 MCG capsule Commonly known as: Linzess Take 1 capsule (72 mcg total) by mouth daily before breakfast.   nitroGLYCERIN 0.4 MG SL tablet Commonly known as: NITROSTAT PLACE 1 TAB UNDER TONGUE EVERY 3 MINUTES AS DIRECTED UP TO 3 TIMES AS NEEDED FOR CHEST PAIN.   Omnipod DASH Pods (Gen 4) Misc Change pod every 48 hrs as directed   pantoprazole 40 MG tablet Commonly known as: PROTONIX Take 1 tablet (40 mg total) by mouth 2 (two) times daily before a meal.   Pen Needles 31G X 6 MM Misc Use to inject insulin 4 times daily   Global Ease Inject Pen Needles 31G X 5 MM Misc Generic drug: Insulin Pen Needle Inject into the skin.   Repatha SureClick [id number] MG/ML Soaj Generic drug: Evolocumab Inject 1 Dose into the skin every 14 (fourteen) days.   rosuvastatin 40 MG tablet Commonly known as: CRESTOR Take 1 tablet (40 mg total) by mouth daily.   tamsulosin 0.4 MG Caps capsule Commonly known as: FLOMAX Take 1 capsule (0.4 mg total) by mouth at bedtime.   Vyvanse 50 MG capsule Generic drug: lisdexamfetamine Take 50 mg by mouth daily.        Diagnostic Studies: DG Cervical Spine 2 or 3 views  Result Date: 03/05/2022 CLINICAL DATA:  ACDF EXAM: CERVICAL SPINE - 2-3 VIEW COMPARISON:  01/21/2022 FINDINGS: Six fluoroscopic images are obtained during the performance of the procedure and are provided for interpretation only. Endotracheal tube is identified. Initial images demonstrate surgical instrumentation at the C6-7 level. Final images demonstrate C6-7 ACDF. Alignment is anatomic. Stable spondylosis at the C4-5 level. Fluoroscopy time: 54 seconds, 9.35 mGy IMPRESSION: 1. Intraoperative examination during C6-7 ACDF. Please refer to operative  report. Electronically Signed   By: Randa Ngo M.D.   On: 03/05/2022 18:42   DG C-Arm 1-60 Min-No Report  Result Date: 03/05/2022 Fluoroscopy was utilized by the requesting physician.  No radiographic interpretation.   DG C-Arm 1-60 Min-No Report  Result Date: 03/05/2022 Fluoroscopy was utilized by the requesting physician.  No radiographic interpretation.   DG C-Arm 1-60 Min-No Report  Result Date: 03/05/2022 Fluoroscopy was utilized by the requesting physician.  No radiographic interpretation.    Discharge Instructions     Incentive spirometry RT   Complete by: As directed         Follow-up Information     Melina Schools, MD. Schedule an appointment as soon as possible for a visit in 2 week(s).   Specialty: Orthopedic Surgery Why: If symptoms worsen, For suture removal, For wound re-check Contact information: [address] [place] [address] [id number]                 Discharge Plan:  discharge to home.  Disposition: Patient doing quite well status post ACDF C6-7.  I have discussed with her the reason for the fusion instead of the disc replacement and all of her questions were addressed.  She is ambulating, tolerating a regular diet, and voiding spontaneously.  She is requesting a cane to assist in her ambulation and she did feel unsteady on her feet secondary to the myelopathy.  Will make sure she has a cane prior to discharge.  She will also work with physical therapy to improve navigating stairs.  Provide the patient is cleared by physical therapy she can be discharged home later today.  Appropriate instructions and medications have been provided.  She will follow-up with me in 2 weeks for reevaluation.    Signed: Dahlia Bailiff for Dr. Melina Schools [facility] [phone] 03/06/2022, 7:13 AM

## 2022-03-06 NOTE — Progress Notes (Signed)
Patient alert and oriented, mae's well, voiding adequate amount of urine, swallowing without difficulty, no c/o pain at time of discharge. Patient discharged home with family. Script and discharged instructions given to patient. Patient and family stated understanding of instructions given. Patient has an appointment with Dr. Rolena Infante in 2 weeks

## 2022-03-09 ENCOUNTER — Ambulatory Visit: Payer: Medicaid Other | Attending: Cardiovascular Disease | Admitting: Cardiovascular Disease

## 2022-03-10 ENCOUNTER — Encounter: Payer: Self-pay | Admitting: Cardiovascular Disease

## 2022-03-12 ENCOUNTER — Encounter: Payer: Self-pay | Admitting: Radiology

## 2022-03-13 ENCOUNTER — Ambulatory Visit: Payer: Medicaid Other | Admitting: Urology

## 2022-03-13 DIAGNOSIS — N3281 Overactive bladder: Secondary | ICD-10-CM

## 2022-03-26 ENCOUNTER — Other Ambulatory Visit: Payer: Self-pay | Admitting: *Deleted

## 2022-03-26 ENCOUNTER — Other Ambulatory Visit (HOSPITAL_COMMUNITY): Payer: Self-pay

## 2022-03-26 DIAGNOSIS — E1165 Type 2 diabetes mellitus with hyperglycemia: Secondary | ICD-10-CM

## 2022-03-26 MED ORDER — DEXCOM G6 TRANSMITTER MISC: 1.0000 | 1 refills | Status: DC

## 2022-03-27 ENCOUNTER — Telehealth: Payer: Self-pay | Admitting: Nurse Practitioner

## 2022-03-27 ENCOUNTER — Other Ambulatory Visit: Payer: Self-pay | Admitting: *Deleted

## 2022-03-27 ENCOUNTER — Telehealth: Payer: Self-pay | Admitting: *Deleted

## 2022-03-27 DIAGNOSIS — E1165 Type 2 diabetes mellitus with hyperglycemia: Secondary | ICD-10-CM

## 2022-03-27 MED ORDER — DEXCOM G6 SENSOR MISC: 3 refills | Status: DC

## 2022-03-27 NOTE — Telephone Encounter (Signed)
Arefill for her sensors have been sent in to the patient's pharmacy.

## 2022-03-27 NOTE — Telephone Encounter (Signed)
Pt asked that her sensors be sent in, she said that the transmitter was sent in 2x. [place]

## 2022-03-27 NOTE — Telephone Encounter (Signed)
Patient has called the office numerous times today. A prescription had been sent to [facility] for her Dexcom G6 receiver and the sensors. This was done Patient would like to get over the weekend. At this time we have not seen anything about the PA.  Patient was advised that we would reach out to the RX PA team and make them aware ,so that they may be on the look out for this request.

## 2022-03-30 ENCOUNTER — Other Ambulatory Visit (HOSPITAL_COMMUNITY): Payer: Self-pay

## 2022-03-31 ENCOUNTER — Other Ambulatory Visit (HOSPITAL_COMMUNITY): Payer: Self-pay

## 2022-03-31 NOTE — Telephone Encounter (Signed)
I see the orders for sensors and transmitters, but not for a receiver. Does she need a new receiver? Working on [place] requests now.

## 2022-03-31 NOTE — Telephone Encounter (Signed)
Yes she will need both.

## 2022-03-31 NOTE — Telephone Encounter (Addendum)
Pharmacy Patient Advocate Encounter   Received notification from Pt calls msgs/LPN that prior authorization for Dexcom is required/requested.   PA submitted on 03/31/22 to (ins) [place] Medicaid via [employer] or (Medicaid) confirmation # [id number] W (sensors), [phone] W (transmitter) Status is pending

## 2022-04-02 ENCOUNTER — Other Ambulatory Visit (HOSPITAL_COMMUNITY): Payer: Self-pay

## 2022-04-02 NOTE — Telephone Encounter (Signed)
Pharmacy Patient Advocate Encounter  Prior Authorization for Dexcom sensors and transmitter has been approved by [place] Medicaid (ins).    PA # [id number] (sensors), [phone] transmitter Effective dates: 03/31/22 through 03/31/23  Spoke with Pharmacy to process. Copay is $0

## 2022-04-02 NOTE — Telephone Encounter (Signed)
I have called both numbers on the patients profile, no answer and unable to leave a voicemail. I called [place] and made them aware.

## 2022-04-16 ENCOUNTER — Telehealth: Payer: Self-pay | Admitting: Cardiovascular Disease

## 2022-04-16 NOTE — Progress Notes (Unsigned)
Cardiology Office Note:    Date:  04/17/2022   ID:  Sandra [name], DOB 1975-02-15, MRN [id number]  PCP:  Wilmon Pali, FNP   [facility] Providers Cardiologist:  Charlton Haws, MD     Referring MD: Wilmon Pali, FNP   Chief Complaint:  No chief complaint on file.     Patient Profile:   Sandra [name] is a 47 y.o. female with:  Coronary artery disease  Cath 5/22:  diffuse small vessel disease (diabetic); p-mRCA 70; occluded PDA w/ L-R collats; OM2 60 >> med Rx Myoview 6/22 ([facility]): no ischemia, EF 34 Myoview 2/22: no ischemia, EF 45, ?inf infarct  Echocardiogram 3/22: inf HK-AK, EF 50-55 Unable to get cMRI due to interstim implant  Hypertension  Hyperlipidemia  Diabetes mellitus  ADHD Bipolar d/o Bladder CA S/p interstim implant  GERD  Husband died due to COVID-19 in 09-25-22 Allergic to ACEi (urticaria with benazepril)   History of Present Illness: Sandra [name]  has small vessel diabetic disease.  Cath done 05/31/20 with occluded PDA with left to right collaterals , 60% OM2 and  50-70% proximal and mid RCA   Seen in [facility] ER 02/07/21 She was concerned about aortic aneurysm but review of scan only shows mild aortic ectasia 3.9 cm    Sees Hilty for familial HLD. On repatha crestor and fibrates BS better controlled Smoking    Hospitalized July 2023 with intractable nausea Had right hydronephrosis with stenting by Dr Ronne Binning 08/14/21    She adopted 2 daughters in past One died and she has her 57 yo grandson John living with her Other daughter with her at home as well  Had neck surgery on 03/05/22 She has had arm pain/ paraesthesias, fatigue , malaise and atypical chest pain since She has not taken any nitro. BP elevated today and she has not taken her Toprol, imdur , or Norvasc Indicates not smoking since surgery      Prior CV studies: Myoview 06/21/20 ([facility]) No evidence of inducible myocardial ischemia or myocardial infarction. LVEF 34%.    Echocardiogram 06/19/20  ([facility]) EF 43, mild LVH, mild RV systolic dysfunction, mild MR, mild TR, trivial TR  Echocardiogram June 2023  Aorta 3.7 cm EF 50-55% improved    LEFT HEART CATH AND CORONARY ANGIOGRAPHY 05/31/2020 RCA proximal 30, mid 70, distal 50; RPDA 100 LCx proximal 40, mid 20; OM1 30, OM2 60 LAD proximal 20, mid 20 RI 30 you to thank you    Echocardiogram 03/27/20 Severe inferior HK/AK, EF 50-55, mild LVH, normal RVSF, trivial MR   NM Myocar Multi W/Spect W/Wall Motion / EF 03/07/2020 No large ischemia, EF 45, low risk   Past Medical History:  Diagnosis Date   ADHD (attention deficit hyperactivity disorder)    Allergic rhinitis    Anemia    hx of   Anxiety    Arthritis    Barrett's esophagus    Bipolar 1 disorder    Bladder tumor    CAD (coronary artery disease)    Cancer    bladder   Constipation    Depression    Dilated cardiomyopathy 12/20/2020   Echo 6/22 ([facility]): EF 43, mild MR, mild TR // Echo 3/22: EF 50-55   Dizziness    Full dentures    GERD (gastroesophageal reflux disease)    Gross hematuria    Hyperlipidemia    Hypertension    Stable angina    Type 2 diabetes mellitus    Urgency of urination  dysuria, sui   Wears glasses    Current Medications: Current Meds  Medication Sig   ACCU-CHEK GUIDE test strip USE AS INSTRUCTED TO MONITOR GLUCOSE 4 TIMES DAILY BEFORE MEALS AND BEFORE BED.   Accu-Chek Softclix Lancets lancets Use as instructed to monitor glucose 4 times daily   albuterol (PROAIR HFA) 108 (90 Base) MCG/ACT inhaler Inhale 2 puffs into the lungs every 6 (six) hours as needed for wheezing or shortness of breath.   amLODipine (NORVASC) 5 MG tablet Take 1 tablet (5 mg total) by mouth daily.   aspirin EC 81 MG tablet Take 1 tablet (81 mg total) by mouth daily with breakfast. Swallow whole.   benzonatate (TESSALON) 100 MG capsule Take 1 capsule (100 mg total) by mouth every 8 (eight) hours.   Blood Glucose Monitoring Suppl (ACCU-CHEK GUIDE) w/Device KIT 1  Piece by Does not apply route as directed.   Blood Pressure Monitoring (BLOOD PRESSURE CUFF) MISC See admin instructions.   buPROPion (WELLBUTRIN XL) 300 MG 24 hr tablet Take 300 mg by mouth every morning.   cloNIDine HCl (KAPVAY) 0.1 MG TB12 ER tablet Take 0.1 mg by mouth at bedtime.   Continuous Blood Gluc Sensor (DEXCOM G6 SENSOR) MISC Apply new sensor every 10 days as directed   Continuous Blood Gluc Transmit (DEXCOM G6 TRANSMITTER) MISC 1 Device by Does not apply route every 3 (three) months.   cyclobenzaprine (FLEXERIL) 10 MG tablet Take 1 tablet (10 mg total) by mouth 3 (three) times daily as needed for muscle spasms.   cyclobenzaprine (FLEXERIL) 10 MG tablet Take 1 tablet (10 mg total) by mouth 3 (three) times daily as needed for muscle spasms.   Evolocumab (REPATHA SURECLICK) 140 MG/ML SOAJ Inject 1 Dose into the skin every 14 (fourteen) days.   fenofibrate (TRICOR) 145 MG tablet Take 1 tablet (145 mg total) by mouth daily.   FLUoxetine (PROZAC) 20 MG capsule Take 20 mg by mouth at bedtime.   FLUoxetine (PROZAC) 40 MG capsule Take 40 mg by mouth in the morning.   gabapentin (NEURONTIN) 600 MG tablet Take 600 mg by mouth 3 (three) times daily.   GLOBAL EASE INJECT PEN NEEDLES 31G X 5 MM MISC Inject into the skin.   insulin aspart (NOVOLOG FLEXPEN) 100 UNIT/ML FlexPen Inject 8-14 Units into the skin 3 (three) times daily with meals.   insulin aspart (NOVOLOG) 100 UNIT/ML injection Use with Omnipod for TDD around 150 units daily   Insulin Disposable Pump (OMNIPOD DASH PODS, GEN 4,) MISC Change pod every 48 hrs as directed   insulin glargine (LANTUS SOLOSTAR) 100 UNIT/ML Solostar Pen Inject 35 Units into the skin at bedtime.   Insulin Pen Needle (PEN NEEDLES) 31G X 6 MM MISC Use to inject insulin 4 times daily   INSULIN SYRINGE 1CC/29G 29G X 1/2" 1 ML MISC Use to inject insulin into Omnipod   isosorbide mononitrate (IMDUR) 30 MG 24 hr tablet Take 1 tablet (30 mg total) by mouth daily.    linaclotide (LINZESS) 72 MCG capsule Take 1 capsule (72 mcg total) by mouth daily before breakfast.   metoprolol succinate (TOPROL-XL) 50 MG 24 hr tablet Take 1 tablet (50 mg total) by mouth daily. Take with or immediately following a meal.   nitroGLYCERIN (NITROSTAT) 0.4 MG SL tablet PLACE 1 TAB UNDER TONGUE EVERY 3 MINUTES AS DIRECTED UP TO 3 TIMES AS NEEDED FOR CHEST PAIN.   pantoprazole (PROTONIX) 40 MG tablet Take 1 tablet (40 mg total) by mouth 2 (two)  times daily before a meal.   rosuvastatin (CRESTOR) 40 MG tablet Take 1 tablet (40 mg total) by mouth daily.   tamsulosin (FLOMAX) 0.4 MG CAPS capsule Take 1 capsule (0.4 mg total) by mouth at bedtime.   VYVANSE 50 MG capsule Take 50 mg by mouth daily.    Allergies:   Ibuprofen, Benazepril, Diphenhydramine, Latex, Metronidazole, Oxycodone, Toradol [ketorolac tromethamine], Zofran [ondansetron hcl], Adhesive [tape], Codeine, Loratadine, Nystatin-triamcinolone, and Penicillins   Social History   Tobacco Use   Smoking status: Former    Packs/day: 0.50    Years: 10.00    Additional pack years: 0.00    Total pack years: 5.00    Types: Cigarettes    Quit date: 01/31/2021    Years since quitting: 1.2   Smokeless tobacco: Never   Tobacco comments:    02/06/21-no smoking past 5 days  Vaping Use   Vaping Use: Never used  Substance Use Topics   Alcohol use: No   Drug use: No    Family Hx: The patient's family history includes Breast cancer in her maternal aunt; Cancer in her mother and another family member; Diabetes in her daughter; Hypertension in her mother; Lung cancer in her maternal uncle. There is no history of Colon cancer.  ROS see HPI  EKGs/Labs/Other Test Reviewed:    EKG:  EKG is  ordered today.  SR rate 97 read as normal but old IMI evident no acute ST changes   Recent Labs: 07/21/2021: Magnesium 1.9 12/10/2021: ALT 22 02/25/2022: BUN 15; Creatinine, Ser 1.01; Hemoglobin 13.1; Platelets 463; Potassium 4.6; Sodium 136    Recent Lipid Panel Lab Results  Component Value Date/Time   CHOL 175 07/20/2021 07:59 AM   CHOL 246 (H) 06/05/2020 08:53 AM   TRIG 245 (H) 07/20/2021 07:59 AM   HDL 27 (L) 07/20/2021 07:59 AM   HDL 31 (L) 06/05/2020 08:53 AM   LDLCALC 99 07/20/2021 07:59 AM   LDLCALC 163 (H) 06/05/2020 08:53 AM     Risk Assessment/Calculations:          Physical Exam:    VS:  BP (!) 186/108   Pulse 97   Ht 5' (1.524 m)   Wt 180 lb 12.8 oz (82 kg)   SpO2 96%   BMI 35.31 kg/m     Wt Readings from Last 3 Encounters:  04/17/22 180 lb 12.8 oz (82 kg)  03/05/22 176 lb (79.8 kg)  02/25/22 186 lb (84.4 kg)    Affect appropriate Healthy:  appears stated age HEENT: normal xanthelasma  Neck supple with no adenopathy JVP normal no bruits no thyromegaly Lungs COPD with mild exp wheezing  Heart:  S1/S2 no murmur, no rub, gallop or click PMI normal Abdomen: benighn, BS positve, no tenderness, no AAA no bruit.  No HSM or HJR Distal pulses intact with no bruits No edema Neuro non-focal Skin warm and dry Xanthelasma over eyes  No muscular weakness   Plan:  Familial hypercholesterolemia She currently remains on rosuvastatin 40 mg daily, fenofibrate 145 mg daily, evolocumab 140 mg every 14 days.  Imporoved with LDL particle # 1216 and LDL 99 F/U Dr Caralyn Guile    CAD (coronary artery disease) Cardiac catheterization in 5/22 with diffuse small vessel diabetic disease.  She has moderate disease in the proximal to mid RCA and occluded PDA with left-to-right collaterals as well as moderate disease in OM 2.  She has been managed medically. Noncompliant with all her meds and BP elevated Refill toprol 50 mg ,  imdur 30 mg and norvasc 5 mg Lexiscan myovue in [place] once BP better controlled ? 4 weeks ECG in office today non acute normal ST segments old IMI read as normal by computer   Essential hypertension, benign See above meds restarted    Dilated cardiomyopathy (HCC) Improved by TTE 06/17/21 to  50-55% She is NYHA II.      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 Atypical pneumonia post antibiotic RX Smoker will check lung cancer CT   Ortho:  f/u Dr Shon Baton regarding recent anterior cervical spine surgery and bilateral UE paresthesias     Medication Adjustments/Labs and Tests Ordered: Current medicines are reviewed at length with the patient today.  Concerns regarding medicines are outlined above.  Tests Ordered:  Lexiscan myovue   Medication Changes: Meds ordered this encounter  Medications   amLODipine (NORVASC) 5 MG tablet    Sig: Take 1 tablet (5 mg total) by mouth daily.    Dispense:  90 tablet    Refill:  3   metoprolol succinate (TOPROL-XL) 50 MG 24 hr tablet    Sig: Take 1 tablet (50 mg total) by mouth daily. Take with or immediately following a meal.    Dispense:  90 tablet    Refill:  3   isosorbide mononitrate (IMDUR) 30 MG 24 hr tablet    Sig: Take 1 tablet (30 mg total) by mouth daily.    Dispense:  90 tablet    Refill:  3   Refilled Toprol , Imdur, and Norvasc   F/U with PA/NP post stress testing in [place]   Signed, Charlton Haws, MD  04/17/2022 4:35 PM    [facility] [address] [place], [place], [place]  [address] Phone: [phone]; Fax: [phone]

## 2022-04-16 NOTE — Telephone Encounter (Signed)
Pt c/o of Chest Pain: STAT if CP now or developed within 24 hours  1. Are you having CP right now? No  2. Are you experiencing any other symptoms (ex. SOB, nausea, vomiting, sweating)? Sweating   3. How long have you been experiencing CP? A couple of months  4. Is your CP continuous or coming and going? Coming and going   5. Have you taken Nitroglycerin? Yes - last took 04/01  Patient had neck surgery recently on 02/22 and states this is the reason for no show of last appt, but says that she is having chest pain issues now and has made another appt, but would like to speak to someone in regards to this pain.  ?

## 2022-04-16 NOTE — Telephone Encounter (Signed)
Patient had to cancel last appointment due to surgery and requests appointment at the [facility] office. Apt was made for Friday 04/14/22 at 4:30 pm   No active CP, symptoms intermittent for months.   I advised her to go to the ER if symptoms worsened.

## 2022-04-17 ENCOUNTER — Ambulatory Visit: Payer: Medicaid Other | Attending: Cardiovascular Disease | Admitting: Cardiovascular Disease

## 2022-04-17 ENCOUNTER — Encounter: Payer: Self-pay | Admitting: Cardiovascular Disease

## 2022-04-17 ENCOUNTER — Telehealth: Payer: Self-pay | Admitting: Cardiovascular Disease

## 2022-04-17 VITALS — BP 186/108 | HR 97 | Ht 60.0 in | Wt 180.8 lb

## 2022-04-17 DIAGNOSIS — E782 Mixed hyperlipidemia: Secondary | ICD-10-CM | POA: Diagnosis not present

## 2022-04-17 DIAGNOSIS — I10 Essential (primary) hypertension: Secondary | ICD-10-CM | POA: Diagnosis not present

## 2022-04-17 DIAGNOSIS — E7801 Familial hypercholesterolemia: Secondary | ICD-10-CM | POA: Diagnosis not present

## 2022-04-17 DIAGNOSIS — I25119 Atherosclerotic heart disease of native coronary artery with unspecified angina pectoris: Secondary | ICD-10-CM | POA: Insufficient documentation

## 2022-04-17 MED ORDER — METOPROLOL SUCCINATE ER 50 MG PO TB24: 50.0000 mg | ORAL_TABLET | Freq: Every day | ORAL | 3 refills | Status: DC

## 2022-04-17 MED ORDER — AMLODIPINE BESYLATE 5 MG PO TABS: 5.0000 mg | ORAL_TABLET | Freq: Every day | ORAL | 3 refills | Status: DC

## 2022-04-17 MED ORDER — ISOSORBIDE MONONITRATE ER 30 MG PO TB24: 30.0000 mg | ORAL_TABLET | Freq: Every day | ORAL | 3 refills | Status: AC

## 2022-04-17 NOTE — Patient Instructions (Signed)
Medication Instructions:  START Amlodipine 5mg  daily START Metoprolol Succinate (Toprol XL) 50mg  daily START Isosorbide Mononitrate 30mg  daily *If you need a refill on your cardiac medications before your next appointment, please call your pharmacy*   Lab Work: NONE If you have labs (blood work) drawn today and your tests are completely normal, you will receive your results only by: MyChart Message (if you have MyChart) OR A paper copy in the mail If you have any lab test that is abnormal or we need to change your treatment, we will call you to review the results.   Testing/Procedures: Eugenie Birks Stress test Your physician has requested that you have a lexiscan myoview. For further information please visit [email or web address] Please follow instruction sheet, as given.  Follow-Up: At [facility], you and your health needs are our priority.  As part of our continuing mission to provide you with exceptional heart care, we have created designated Provider Care Teams.  These Care Teams include your primary Cardiologist (physician) and Advanced Practice Providers (APPs -  Physician Assistants and Nurse Practitioners) who all work together to provide you with the care you need, when you need it.  Your next appointment:   4 week(s) after stress test  Provider:   APP in [place] office Other Instructions See separate sheet for stress test instructions

## 2022-04-17 NOTE — Telephone Encounter (Signed)
Pt seen in clinic 04/17/22 by Eden Emms and ordered to have lexiscan at [facility]. Order placed for the image2038, but [facility] here states that they couldn't get ahold of [place] office to get scheduled. Additionally, pt will need follow-up approximately 4 weeks after lexiscan is complete with an APP in [place] office.  Will route to [facility] triage and scheduling to arrange these orders.

## 2022-04-20 NOTE — Telephone Encounter (Signed)
Please schedule pt for lexiscan and follow up.

## 2022-04-21 ENCOUNTER — Telehealth: Payer: Self-pay | Admitting: *Deleted
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
## 2022-04-22 NOTE — Telephone Encounter (Signed)
I am just wondering why this is the first time we are hearing about it if it happened that long ago.  I recommend she reach out to [employer] first to see if this something they can help with or if I need to send in a new prescription for one.  She may be facing a hefty fee to restart from scratch.

## 2022-04-22 NOTE — Telephone Encounter (Signed)
Patient was called and a voicemail was left with Whitney's recommendation.

## 2022-04-23 ENCOUNTER — Telehealth: Payer: Self-pay | Admitting: *Deleted

## 2022-04-23 ENCOUNTER — Emergency Department (HOSPITAL_COMMUNITY): Payer: Medicaid Other

## 2022-04-23 ENCOUNTER — Telehealth: Payer: Self-pay

## 2022-04-23 ENCOUNTER — Encounter (HOSPITAL_COMMUNITY): Payer: Self-pay

## 2022-04-23 ENCOUNTER — Emergency Department (HOSPITAL_COMMUNITY)
Admission: EM | Admit: 2022-04-23 | Discharge: 2022-04-23 | Disposition: A | Discharge status: Home / Self Care | Payer: Medicaid Other | Attending: Emergency Medicine | Admitting: Emergency Medicine

## 2022-04-23 ENCOUNTER — Other Ambulatory Visit: Payer: Self-pay

## 2022-04-23 DIAGNOSIS — E119 Type 2 diabetes mellitus without complications: Secondary | ICD-10-CM | POA: Insufficient documentation

## 2022-04-23 DIAGNOSIS — I25119 Atherosclerotic heart disease of native coronary artery with unspecified angina pectoris: Secondary | ICD-10-CM

## 2022-04-23 DIAGNOSIS — I2510 Atherosclerotic heart disease of native coronary artery without angina pectoris: Secondary | ICD-10-CM | POA: Diagnosis not present

## 2022-04-23 DIAGNOSIS — R519 Headache, unspecified: Secondary | ICD-10-CM | POA: Diagnosis present

## 2022-04-23 DIAGNOSIS — Z91040 Latex allergy status: Secondary | ICD-10-CM | POA: Insufficient documentation

## 2022-04-23 DIAGNOSIS — R079 Chest pain, unspecified: Secondary | ICD-10-CM

## 2022-04-23 DIAGNOSIS — I159 Secondary hypertension, unspecified: Secondary | ICD-10-CM | POA: Diagnosis not present

## 2022-04-23 DIAGNOSIS — I509 Heart failure, unspecified: Secondary | ICD-10-CM | POA: Diagnosis not present

## 2022-04-23 DIAGNOSIS — Z794 Long term (current) use of insulin: Secondary | ICD-10-CM | POA: Diagnosis not present

## 2022-04-23 DIAGNOSIS — Z7982 Long term (current) use of aspirin: Secondary | ICD-10-CM | POA: Diagnosis not present

## 2022-04-23 DIAGNOSIS — E7801 Familial hypercholesterolemia: Secondary | ICD-10-CM

## 2022-04-23 DIAGNOSIS — E782 Mixed hyperlipidemia: Secondary | ICD-10-CM

## 2022-04-23 DIAGNOSIS — Z79899 Other long term (current) drug therapy: Secondary | ICD-10-CM | POA: Insufficient documentation

## 2022-04-23 DIAGNOSIS — I110 Hypertensive heart disease with heart failure: Secondary | ICD-10-CM | POA: Diagnosis not present

## 2022-04-23 DIAGNOSIS — I10 Essential (primary) hypertension: Secondary | ICD-10-CM

## 2022-04-23 LAB — CBC
HCT: 40.4 % (ref 36.0–46.0)
Hemoglobin: 12.8 g/dL (ref 12.0–15.0)
MCH: 24.5 pg — ABNORMAL LOW (ref 26.0–34.0)
MCHC: 31.7 g/dL (ref 30.0–36.0)
MCV: 77.4 fL — ABNORMAL LOW (ref 80.0–100.0)
Platelets: 378 10*3/uL (ref 150–400)
RBC: 5.22 MIL/uL — ABNORMAL HIGH (ref 3.87–5.11)
RDW: 15.1 % (ref 11.5–15.5)
WBC: 10.6 10*3/uL — ABNORMAL HIGH (ref 4.0–10.5)
nRBC: 0 % (ref 0.0–0.2)

## 2022-04-23 LAB — COMPREHENSIVE METABOLIC PANEL
ALT: 28 U/L (ref 0–44)
AST: 28 U/L (ref 15–41)
Albumin: 3.6 g/dL (ref 3.5–5.0)
Alkaline Phosphatase: 155 U/L — ABNORMAL HIGH (ref 38–126)
Anion gap: 12 (ref 5–15)
BUN: 15 mg/dL (ref 6–20)
CO2: 21 mmol/L — ABNORMAL LOW (ref 22–32)
Calcium: 9.1 mg/dL (ref 8.9–10.3)
Chloride: 98 mmol/L (ref 98–111)
Creatinine, Ser: 0.75 mg/dL (ref 0.44–1.00)
GFR, Estimated: >60 mL/min (ref >60)
Glucose, Bld: 183 mg/dL — ABNORMAL HIGH (ref 70–99)
Potassium: 3.9 mmol/L (ref 3.5–5.1)
Sodium: 131 mmol/L — ABNORMAL LOW (ref 135–145)
Total Bilirubin: 0.4 mg/dL (ref 0.3–1.2)
Total Protein: 7.5 g/dL (ref 6.5–8.1)

## 2022-04-23 LAB — TROPONIN I (HIGH SENSITIVITY): Troponin I (High Sensitivity): 5 ng/L (ref <18)

## 2022-04-23 LAB — LIPASE, BLOOD: Lipase: 29 U/L (ref 11–51)

## 2022-04-23 LAB — BRAIN NATRIURETIC PEPTIDE: B Natriuretic Peptide: 15 pg/mL (ref 0.0–100.0)

## 2022-04-23 MED ORDER — AMLODIPINE BESYLATE 5 MG PO TABS
10.0000 mg | ORAL_TABLET | Freq: Every day | ORAL | 3 refills | Status: DC
Start: 2022-04-23 — End: 2023-05-26

## 2022-04-23 MED ORDER — OMNIPOD DASH PDM (GEN 4) KIT: PACK | 0 refills | Status: DC

## 2022-04-23 MED ORDER — BUTALBITAL-APAP-CAFFEINE 50-325-40 MG PO TABS
1.0000 | ORAL_TABLET | Freq: Once | ORAL | Status: AC
  Administered 2022-04-23: 1 via ORAL
  Filled 2022-04-23: qty 1

## 2022-04-23 MED ORDER — METOCLOPRAMIDE HCL 5 MG/ML IJ SOLN
10.0000 mg | Freq: Once | INTRAMUSCULAR | Status: AC
  Administered 2022-04-23: 10 mg via INTRAVENOUS
  Filled 2022-04-23: qty 2

## 2022-04-23 MED ORDER — HYDROCODONE-ACETAMINOPHEN 5-325 MG PO TABS
2.0000 | ORAL_TABLET | ORAL | Status: AC
  Administered 2022-04-23: 2 via ORAL
  Filled 2022-04-23: qty 2

## 2022-04-23 NOTE — ED Provider Notes (Signed)
[place] EMERGENCY DEPARTMENT AT [facility] Provider Note   CSN: [id number] Arrival date & time: 04/23/22  1658     History  Chief Complaint  Patient presents with   Hypertension    Sandra [name] is a 47 y.o. female.  47 year old female with a history of CAD, CHF, DM2, and HTN who presents emergency department headache.  Reports that over the past 2 days has had a constant headache.  Started gradually.  Describes it as being on the top of her head and like a scraping sensation down her temples bilaterally.  Says her blood pressure has been elevated at home up to 185/100.  Has had nausea and vomiting along with photo and phonophobia.  No neck pain.  Denies any fevers.  Has been taking hydrocodone, Tylenol, and ibuprofen for at home without improvement of her symptoms.  Says that she does not typically get headaches.  Also told her cardiologist earlier today that she was having dizziness and shortness of breath.  Says that she may have had chest pain earlier as well.  Has difficulty characterizing the chest pain.       Home Medications Prior to Admission medications   Medication Sig Start Date End Date Taking? Authorizing Provider  aspirin EC 81 MG tablet Take 1 tablet (81 mg total) by mouth daily with breakfast. Swallow whole. 07/24/21  Yes Emokpae, Courage, MD  buPROPion (WELLBUTRIN XL) 300 MG 24 hr tablet Take 300 mg by mouth every morning. 01/18/20  Yes [provider]  cloNIDine HCl (KAPVAY) 0.1 MG TB12 ER tablet Take 0.1 mg by mouth at bedtime.   Yes [provider]  cyclobenzaprine (FLEXERIL) 10 MG tablet Take 1 tablet (10 mg total) by mouth 3 (three) times daily as needed for muscle spasms. 03/05/22  Yes Venita Lick, MD  Evolocumab (REPATHA SURECLICK) 140 MG/ML SOAJ Inject 1 Dose into the skin every 14 (fourteen) days. 06/24/21  Yes Hilty, Lisette Abu, MD  fenofibrate (TRICOR) 145 MG tablet Take 1 tablet (145 mg total) by mouth daily. 06/24/21  Yes  Hilty, Lisette Abu, MD  FLUoxetine (PROZAC) 20 MG capsule Take 20 mg by mouth at bedtime. 10/24/21  Yes [provider]  FLUoxetine (PROZAC) 40 MG capsule Take 40 mg by mouth in the morning. 04/02/20  Yes [provider]  gabapentin (NEURONTIN) 600 MG tablet Take 600 mg by mouth 3 (three) times daily.   Yes [provider]  HYDROcodone-acetaminophen (NORCO) 10-325 MG tablet Take 1 tablet by mouth 3 (three) times daily as needed. 03/23/22  Yes [provider]  insulin aspart (NOVOLOG FLEXPEN) 100 UNIT/ML FlexPen Inject 8-14 Units into the skin 3 (three) times daily with meals. Patient taking differently: Inject 8-16 Units into the skin 3 (three) times daily with meals. 03/04/22  Yes Reardon, Freddi Starr, NP  insulin glargine (LANTUS SOLOSTAR) 100 UNIT/ML Solostar Pen Inject 35 Units into the skin at bedtime. 03/04/22  Yes Dani Gobble, NP  isosorbide mononitrate (IMDUR) 30 MG 24 hr tablet Take 1 tablet (30 mg total) by mouth daily. 04/17/22  Yes Wendall Stade, MD  linaclotide [facility]) 72 MCG capsule Take 1 capsule (72 mcg total) by mouth daily before breakfast. 06/18/21  Yes Letta Median, PA-C  metoprolol succinate (TOPROL-XL) 50 MG 24 hr tablet Take 1 tablet (50 mg total) by mouth daily. Take with or immediately following a meal. 04/17/22  Yes Wendall Stade, MD  nitroGLYCERIN (NITROSTAT) 0.4 MG SL tablet PLACE 1 TAB  UNDER TONGUE EVERY 3 MINUTES AS DIRECTED UP TO 3 TIMES AS NEEDED FOR CHEST PAIN. Patient taking differently: Place 0.4 mg under the tongue every 5 (five) minutes as needed for chest pain. 02/16/22  Yes Weaver, Scott T, PA-C  pantoprazole (PROTONIX) 40 MG tablet Take 1 tablet (40 mg total) by mouth 2 (two) times daily before a meal. 06/18/21  Yes Letta MedianHarper, Kristen S, PA-C  tamsulosin (FLOMAX) 0.4 MG CAPS capsule Take 1 capsule (0.4 mg total) by mouth at bedtime. Patient taking differently: Take 0.4 mg by mouth daily. 12/12/21  Yes Summerlin, Regan RakersJulienne Annette,  PA-C  VYVANSE 50 MG capsule Take 50 mg by mouth daily. 11/03/21  Yes [provider]  ACCU-CHEK GUIDE test strip USE AS INSTRUCTED TO MONITOR GLUCOSE 4 TIMES DAILY BEFORE MEALS AND BEFORE BED. 04/29/21   Dani Gobbleeardon, Whitney J, NP  Accu-Chek Softclix Lancets lancets Use as instructed to monitor glucose 4 times daily 07/10/20   Dani Gobbleeardon, Whitney J, NP  albuterol (PROAIR HFA) 108 (90 Base) MCG/ACT inhaler Inhale 2 puffs into the lungs every 6 (six) hours as needed for wheezing or shortness of breath. Patient not taking: Reported on 04/23/2022 07/24/21   Shon HaleEmokpae, Courage, MD  amLODipine (NORVASC) 5 MG tablet Take 2 tablets (10 mg total) by mouth daily. 04/23/22   Rondel BatonPaterson, Clytee Heinrich C, MD  benzonatate (TESSALON) 100 MG capsule Take 1 capsule (100 mg total) by mouth every 8 (eight) hours. Patient not taking: Reported on 04/23/2022 11/24/21   Blue, Soijett A, PA-C  Blood Glucose Monitoring Suppl (ACCU-CHEK GUIDE) w/Device KIT 1 Piece by Does not apply route as directed. 04/15/20   Dani Gobbleeardon, Whitney J, NP  Blood Pressure Monitoring (BLOOD PRESSURE CUFF) MISC See admin instructions.    [provider]  Continuous Blood Gluc Sensor (DEXCOM G6 SENSOR) MISC Apply new sensor every 10 days as directed 03/27/22   Dani Gobbleeardon, Whitney J, NP  Continuous Blood Gluc Transmit (DEXCOM G6 TRANSMITTER) MISC 1 Device by Does not apply route every 3 (three) months. 03/26/22   Dani Gobbleeardon, Whitney J, NP  GLOBAL EASE INJECT PEN NEEDLES 31G X 5 MM MISC Inject into the skin. 03/13/21   [provider]  insulin aspart (NOVOLOG) 100 UNIT/ML injection Use with Omnipod for TDD around 150 units daily Patient taking differently: Inject into the skin. Use with Omnipod for TDD around 150 units daily 01/14/22   Dani Gobbleeardon, Whitney J, NP  Insulin Disposable Pump (OMNIPOD DASH PDM, GEN 4,) KIT Change pod every 48-72 hours 04/23/22   Dani Gobbleeardon, Whitney J, NP  Insulin Disposable Pump (OMNIPOD DASH PODS, GEN 4,) MISC Change pod every 48 hrs as  directed 01/14/22   Dani Gobbleeardon, Whitney J, NP  Insulin Pen Needle (PEN NEEDLES) 31G X 6 MM MISC Use to inject insulin 4 times daily 02/24/21   Dani Gobbleeardon, Whitney J, NP  INSULIN SYRINGE 1CC/29G 29G X 1/2" 1 ML MISC Use to inject insulin into Omnipod 08/05/21   Dani Gobbleeardon, Whitney J, NP  rosuvastatin (CRESTOR) 40 MG tablet Take 1 tablet (40 mg total) by mouth daily. Patient not taking: Reported on 04/23/2022 06/24/21   Chrystie NoseHilty, Kenneth C, MD      Allergies    Ibuprofen, Benazepril, Diphenhydramine, Latex, Metronidazole, Oxycodone, Toradol [ketorolac tromethamine], Zofran [ondansetron hcl], Adhesive [tape], Codeine, Loratadine, Nystatin-triamcinolone, and Penicillins    Review of Systems   Review of Systems  Physical Exam Updated Vital Signs BP (!) 152/81   Pulse 74   Temp 98.3 F (36.8 C) (Oral)   Resp 17  Ht 5' (1.524 m)   Wt 77.1 kg   SpO2 99%   BMI 33.20 kg/m  Physical Exam Vitals and nursing note reviewed.  Constitutional:      General: She is not in acute distress.    Appearance: She is well-developed.  HENT:     Head: Normocephalic and atraumatic.     Right Ear: External ear normal.     Left Ear: External ear normal.     Nose: Nose normal.  Eyes:     Extraocular Movements: Extraocular movements intact.     Conjunctiva/sclera: Conjunctivae normal.     Pupils: Pupils are equal, round, and reactive to light.  Cardiovascular:     Rate and Rhythm: Normal rate and regular rhythm.     Heart sounds: No murmur heard. Pulmonary:     Effort: Pulmonary effort is normal. No respiratory distress.     Breath sounds: Normal breath sounds.  Abdominal:     General: Abdomen is flat. There is no distension.     Palpations: Abdomen is soft. There is no mass.     Tenderness: There is no abdominal tenderness. There is no guarding.  Musculoskeletal:     Cervical back: Normal range of motion and neck supple.     Right lower leg: No edema.     Left lower leg: No edema.  Skin:    General: Skin is  warm and dry.  Neurological:     Mental Status: She is alert and oriented to person, place, and time. Mental status is at baseline.     Comments: MENTAL STATUS: AAOx3 CRANIAL NERVES: II: Pupils equal and reactive 4 mm BL, no RAPD, no VF deficits III, IV, VI: EOM intact, no gaze preference or deviation, no nystagmus. V: normal sensation to light touch in V1, V2, and V3 segments bilaterally VII: no facial weakness or asymmetry, no nasolabial fold flattening VIII: normal hearing to speech and finger friction IX, X: normal palatal elevation, no uvular deviation XI: 5/5 head turn and 5/5 shoulder shrug bilaterally XII: midline tongue protrusion MOTOR: 5/5 strength in R shoulder flexion, elbow flexion and extension, and grip strength. 5/5 strength in L shoulder flexion, elbow flexion and extension, and grip strength.  5/5 strength in R hip and knee flexion, knee extension, ankle plantar and dorsiflexion. 5/5 strength in L hip and knee flexion, knee extension, ankle plantar and dorsiflexion. SENSORY: Normal sensation to light touch in all extremities COORD: Normal finger to nose and heel to shin, no tremor, no dysmetria STATION: normal stance, no truncal ataxia  Psychiatric:        Mood and Affect: Mood normal.     ED Results / Procedures / Treatments   Labs (all labs ordered are listed, but only abnormal results are displayed) Labs Reviewed  CBC - Abnormal; Notable for the following components:      Result Value   WBC 10.6 (*)    RBC 5.22 (*)    MCV 77.4 (*)    MCH 24.5 (*)    All other components within normal limits  COMPREHENSIVE METABOLIC PANEL - Abnormal; Notable for the following components:   Sodium 131 (*)    CO2 21 (*)    Glucose, Bld 183 (*)    Alkaline Phosphatase 155 (*)    All other components within normal limits  BRAIN NATRIURETIC PEPTIDE  LIPASE, BLOOD  TROPONIN I (HIGH SENSITIVITY)    EKG EKG Interpretation  Date/Time:  Thursday April 23 2022 19:56:57  EDT Ventricular Rate:  79 PR Interval:  150 QRS Duration: 112 QT Interval:  434 QTC Calculation: 498 R Axis:   85 Text Interpretation: Sinus rhythm Borderline intraventricular conduction delay Borderline prolonged QT interval Confirmed by Vonita Moss [phone]) on 04/23/2022 9:08:16 PM  Radiology DG Chest 2 View  Result Date: 04/23/2022 CLINICAL DATA:  Shortness of breath, hypertension EXAM: CHEST - 2 VIEW COMPARISON:  12/10/2021 FINDINGS: Heart and mediastinal contours are within normal limits. No focal opacities or effusions. No acute bony abnormality. IMPRESSION: No active cardiopulmonary disease. Electronically Signed   By: Charlett Nose M.D.   On: 04/23/2022 21:35   CT HEAD WO CONTRAST  Result Date: 04/23/2022 CLINICAL DATA:  Headache EXAM: CT HEAD WITHOUT CONTRAST TECHNIQUE: Contiguous axial images were obtained from the base of the skull through the vertex without intravenous contrast. RADIATION DOSE REDUCTION: This exam was performed according to the departmental dose-optimization program which includes automated exposure control, adjustment of the mA and/or kV according to patient size and/or use of iterative reconstruction technique. COMPARISON:  None Available. FINDINGS: Brain: No evidence of acute infarction, hemorrhage, hydrocephalus, extra-axial collection or mass lesion/mass effect. Vascular: No hyperdense vessel or unexpected calcification. Skull: Normal. Negative for fracture or focal lesion. Sinuses/Orbits: No acute finding. Other: None. IMPRESSION: No acute intracranial abnormality. Electronically Signed   By: Darliss Cheney M.D.   On: 04/23/2022 21:34    Procedures Procedures   Medications Ordered in ED Medications  metoCLOPramide (REGLAN) injection 10 mg (10 mg Intravenous Given 04/23/22 2059)  butalbital-acetaminophen-caffeine (FIORICET) 50-325-40 MG per tablet 1 tablet (1 tablet Oral Given 04/23/22 2100)  HYDROcodone-acetaminophen (NORCO/VICODIN) 5-325 MG per tablet 2  tablet (2 tablets Oral Given 04/23/22 2237)    ED Course/ Medical Decision Making/ A&P                             Medical Decision Making Amount and/or Complexity of Data Reviewed Labs: ordered. Radiology: ordered.  Risk Prescription drug management.   MARCENA MATCHETT is a 47 y.o. female with comorbidities that complicate the patient evaluation including CAD, CHF, DM2, and HTN who presents emergency department with multiple complaints including headache and elevated blood pressure  Initial Ddx:  Subarachnoid hemorrhage, press, stroke, MI, heart failure exacerbation, hypertensive emergency, migraine  MDM:  Feel the patient is likely having a migraine given her symptoms.  Blood pressure was elevated into the 180s at home but appears to have improved spontaneously here.  No focal neurologic deficits however, given the fact that she does not have migraines at baseline does have elevated blood pressure we will check a head CT at this time.  No focal neurologic deficits that would suggest stroke.  Was complaining of chest pain so we will check an EKG and troponin to assess for hypertensive emergency but feel this is less likely.  Patient reportedly has an extensive list of allergies so we will treat her with Reglan and Fioricet at this time.  Plan:  Labs BNP Troponin Chest x-ray CT head EKG Migraine cocktail  ED Summary/Re-evaluation:  Patient is feeling better after the above interventions.  Was still complaining of a mild headache and was given her home dose of Norco.  CT of the head did not reveal any acute findings and her cardiac workup was also reassuring.  Will have her follow-up with her primary doctor in several days and feel that her symptoms are likely due to migraine.  Was also instructed to increase her dose  of amlodipine for her BP.   This patient presents to the ED for concern of complaints listed in HPI, this involves an extensive number of treatment options, and is a  complaint that carries with it a high risk of complications and morbidity. Disposition including potential need for admission considered.   Dispo: DC Home. Return precautions discussed including, but not limited to, those listed in the AVS. Allowed pt time to ask questions which were answered fully prior to dc.  Additional history obtained from family Records reviewed ED Visit Notes The following labs were independently interpreted: Chemistry and show no acute abnormality I independently reviewed the following imaging with scope of interpretation limited to determining acute life threatening conditions related to emergency care: CT Head and agree with the radiologist interpretation with the following exceptions: none I personally reviewed and interpreted cardiac monitoring: normal sinus rhythm  I personally reviewed and interpreted the pt's EKG: see above for interpretation  I have reviewed the patients home medications and made adjustments as needed  Final Clinical Impression(s) / ED Diagnoses Final diagnoses:  Secondary hypertension  Nonintractable headache, unspecified chronicity pattern, unspecified headache type  Chest pain, unspecified type    Rx / DC Orders ED Discharge Orders          Ordered    amLODipine (NORVASC) 5 MG tablet  Daily        04/23/22 2303              Rondel Baton, MD 04/24/22 [phone]

## 2022-04-23 NOTE — Telephone Encounter (Signed)
Noted, thanks!

## 2022-04-23 NOTE — Telephone Encounter (Signed)
I sent in for the Omnipod DASH kit to [facility] pharmacy (they are Dexcom and Omnipods pharmacy) so if they know of any way to get it to her, they will.

## 2022-04-23 NOTE — Discharge Instructions (Signed)
You were seen for your headache and elevated blood pressure in the emergency department.   At home, please continue to take your pain medications for your headache.  Take the increased dose of amlodipine that we have prescribed you for your blood pressure.  Follow-up with your primary doctor in 2-3 days regarding your visit.    Return immediately to the emergency department if you experience any of the following: Severe headache, slurred speech, facial droop, numbness or weakness of your arms or legs that is new, chest pain, or any other concerning symptoms.    Thank you for visiting our Emergency Department. It was a pleasure taking care of you today.

## 2022-04-23 NOTE — ED Triage Notes (Signed)
Pt is complaining of hypertension for about a week, headache all day today, sharpe pain to upper abd.

## 2022-04-23 NOTE — Telephone Encounter (Signed)
Patient called to followup on replacement of her insulin monitor .   She called back and said that they told her that there was nothing that they could do. They told her to have ou to send in another prescription and see if insurance would cover it.

## 2022-04-23 NOTE — Telephone Encounter (Signed)
Patient walked into the office c/o dizziness, elevated bp, light headedness, sob. Pt denies cp/chest pressure/nausea. Pt stated it has lasted about 1-2 weeks. Pt stated that diastolic bp readings range from 101-117. Systolic averages about 185.   Pt stated last night she was sweating profusely with no indication as to why.    Pt stated that she took cardiac medications this morning including amlodipine, imdur, and metoprolol.   O2- 95 HR-88 BP- 140/102- manual recheck    Per Marlyn Corporal, RN patient is being sent to be evaluated in the ED. Patient agrees with dispo.   Will route to DOD and provider for review.

## 2022-04-23 NOTE — Telephone Encounter (Signed)
Patient was called and made aware. She wanted to let Alphonzo Lemmings know that she was in her way to the hospital . She has a terrible headache, nausea and vomiting. Her sister in law is taking her, she feels like they may admit her, just wanted you to be aware.

## 2022-04-23 NOTE — ED Notes (Signed)
Pt c/o numbness in right arm. NIH 1, reported to MD

## 2022-05-02 ENCOUNTER — Other Ambulatory Visit: Payer: Self-pay | Admitting: Nurse Practitioner

## 2022-05-02 DIAGNOSIS — E1165 Type 2 diabetes mellitus with hyperglycemia: Secondary | ICD-10-CM

## 2022-05-03 ENCOUNTER — Other Ambulatory Visit: Payer: Self-pay | Admitting: Nurse Practitioner

## 2022-05-04 ENCOUNTER — Ambulatory Visit (HOSPITAL_COMMUNITY)
Admission: RE | Admit: 2022-05-04 | Discharge: 2022-05-04 | Disposition: A | Discharge status: Home / Self Care | Payer: Medicaid Other | Source: Ambulatory Visit | Attending: Cardiovascular Disease | Admitting: Cardiovascular Disease

## 2022-05-04 ENCOUNTER — Encounter (HOSPITAL_COMMUNITY)
Admission: RE | Admit: 2022-05-04 | Discharge: 2022-05-04 | Disposition: A | Discharge status: Home / Self Care | Payer: Medicaid Other | Source: Ambulatory Visit | Attending: Cardiovascular Disease | Admitting: Cardiovascular Disease

## 2022-05-04 ENCOUNTER — Other Ambulatory Visit: Payer: Self-pay | Admitting: *Deleted

## 2022-05-04 DIAGNOSIS — I25119 Atherosclerotic heart disease of native coronary artery with unspecified angina pectoris: Secondary | ICD-10-CM | POA: Insufficient documentation

## 2022-05-04 DIAGNOSIS — E782 Mixed hyperlipidemia: Secondary | ICD-10-CM | POA: Diagnosis not present

## 2022-05-04 DIAGNOSIS — E7801 Familial hypercholesterolemia: Secondary | ICD-10-CM | POA: Insufficient documentation

## 2022-05-04 DIAGNOSIS — I10 Essential (primary) hypertension: Secondary | ICD-10-CM | POA: Diagnosis not present

## 2022-05-04 LAB — NM MYOCAR MULTI W/SPECT W/WALL MOTION / EF
Base ST Depression (mm): 0 mm
Nuc Stress EF: 54 %
RATE: 0.4
SSS: 2

## 2022-05-04 MED ORDER — SODIUM CHLORIDE FLUSH 0.9 % IV SOLN
INTRAVENOUS | Status: AC
  Administered 2022-05-04: 10 mL via INTRAVENOUS
  Filled 2022-05-04: qty 10

## 2022-05-04 MED ORDER — TECHNETIUM TC 99M TETROFOSMIN IV KIT
10.0000 | PACK | Freq: Once | INTRAVENOUS | Status: AC | PRN
  Administered 2022-05-04: 10.4 via INTRAVENOUS

## 2022-05-04 MED ORDER — REGADENOSON 0.4 MG/5ML IV SOLN
INTRAVENOUS | Status: AC
  Administered 2022-05-04: 0.4 mg
  Filled 2022-05-04: qty 5

## 2022-05-04 MED ORDER — TECHNETIUM TC 99M TETROFOSMIN IV KIT
30.0000 | PACK | Freq: Once | INTRAVENOUS | Status: AC | PRN
  Administered 2022-05-04: 30 via INTRAVENOUS

## 2022-05-05 LAB — NM MYOCAR MULTI W/SPECT W/WALL MOTION / EF
LV dias vol: 104 mL (ref 46–106)
LV sys vol: 48 mL
Peak HR: 85 {beats}/min
Rest HR: 63 {beats}/min
Rest Nuclear Isotope Dose: 10.4 mCi
SDS: 2
SRS: 0
ST Depression (mm): 0 mm
Stress Nuclear Isotope Dose: 30 mCi
TID: 1.05

## 2022-05-06 ENCOUNTER — Encounter (HOSPITAL_COMMUNITY): Payer: Medicaid Other

## 2022-05-13 ENCOUNTER — Other Ambulatory Visit (HOSPITAL_COMMUNITY): Payer: Self-pay | Admitting: Family Medicine

## 2022-05-13 DIAGNOSIS — Z1231 Encounter for screening mammogram for malignant neoplasm of breast: Secondary | ICD-10-CM

## 2022-05-18 ENCOUNTER — Ambulatory Visit: Payer: Medicaid Other | Admitting: Nurse Practitioner

## 2022-05-25 ENCOUNTER — Ambulatory Visit: Payer: Medicaid Other | Admitting: Nurse Practitioner

## 2022-05-25 DIAGNOSIS — I10 Essential (primary) hypertension: Secondary | ICD-10-CM

## 2022-05-25 DIAGNOSIS — E782 Mixed hyperlipidemia: Secondary | ICD-10-CM

## 2022-05-25 DIAGNOSIS — E1165 Type 2 diabetes mellitus with hyperglycemia: Secondary | ICD-10-CM

## 2022-06-05 NOTE — Progress Notes (Deleted)
Cardiology Office Note   Date:  06/05/2022   ID:  [name], DOB [birth year], MRN [id number]  PCP:  Sandra Pali, FNP  Cardiologist:  Sandra [name]     No chief complaint on file.     History of Present Illness: Sandra [name] is a 47 y.o. 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    Recent nuc study negative for ischemia and arrhythmias, EF 54%  no need for cath. Mention of CT for lung cancer due to persistent cough. + pneumonitis in 11/23  Today*** Past Medical History:  Diagnosis Date   ADHD (attention deficit hyperactivity disorder)    Allergic rhinitis    Anemia    hx of   Anxiety    Arthritis    Barrett's esophagus    Bipolar 1 disorder (HCC)    Bladder tumor    CAD (coronary artery disease)    Cancer (HCC)    bladder   Constipation    Depression    Dilated cardiomyopathy (HCC) 12/20/2020   Echo 6/22 ([facility]): EF 43, mild MR, mild TR // Echo 3/22: EF 50-55   Dizziness    Full dentures    GERD (gastroesophageal reflux disease)    Gross hematuria    Hyperlipidemia    Hypertension    Stable angina    Type 2 diabetes mellitus (HCC)    Urgency of urination    dysuria, sui   Wears glasses     Past Surgical History:  Procedure Laterality Date   ABDOMINAL HYSTERECTOMY     ANTERIOR CERVICAL DECOMP/DISCECTOMY FUSION N/A 03/05/2022   Procedure:  ANTERIOR CERVICAL DECOMPRESSION/DISCECTOMY FUSION 1 LEVEL; C6-C7;  Surgeon: Sandra Lick, MD;  Location: [facility];  Service: Orthopedics;  Laterality: N/A;  3 hrs 3 C-Bed   BALLOON DILATION N/A 02/17/2021   Procedure: BALLOON DILATION;  Surgeon: Sandra Bal, DO;  Location: [facility];  Service: Endoscopy;  Laterality: N/A;   BIOPSY  02/17/2021   Procedure: BIOPSY;  Surgeon: Sandra Bal, DO;  Location: [facility];  Service: Endoscopy;;   COLONOSCOPY WITH PROPOFOL N/A 02/17/2021   Surgeon: Sandra Bal, DO; nonbleeding internal hemorrhoids, otherwise normal exam.  Repeat in 10 years.   CYSTOSCOPY N/A 10/03/2015   Procedure: CYSTOSCOPY;  Surgeon: Sandra Pippin, MD;  Location: [facility];  Service: Urology;  Laterality: N/A;   CYSTOSCOPY W/ URETERAL STENT PLACEMENT Right 07/24/2021   Procedure: CYSTOSCOPY WITH RETROGRADE PYELOGRAM/URETERAL STENT PLACEMENT;  Surgeon: Sandra Gauze, MD;  Location: [facility];  Service: Urology;  Laterality: Right;   CYSTOSCOPY WITH RETROGRADE PYELOGRAM, URETEROSCOPY AND STENT PLACEMENT Right 08/14/2021   Procedure: CYSTOSCOPY WITH RETROGRADE PYELOGRAM, URETEROSCOPY AND STENT EXCHANGE;  Surgeon: Sandra Gauze, MD;  Location: [facility];  Service: Urology;  Laterality: Right;   CYSTOSCOPY WITH STENT PLACEMENT Right 10/31/2015  Procedure: CYSTOSCOPY WITH ATTEMPTED RIGHT URETERAL OPENING;  Surgeon: Sandra Pippin, MD;  Location[facility];  Service: Urology;  Laterality: Right;   ESOPHAGOGASTRODUODENOSCOPY (EGD) WITH PROPOFOL N/A 02/17/2021   Surgeon: Sandra Bal, DO; 2 cm hiatal hernia, Barrett's esophagus, gastritis with biopsies negative for H. pylori.  S/p empiric esophageal dilation.  Repeat in 3 years.   FOOT SURGERY Left    September 2022, broke 7 bones in left foot   INTERSTIM IMPLANT PLACEMENT N/A 03/14/2018   Procedure: Leane Platt IMPLANT FIRST STAGE;  Surgeon: Sandra Gauze, MD;  Location: [facility];   Service: Urology;  Laterality: N/A;   INTERSTIM IMPLANT PLACEMENT N/A 03/30/2018   Procedure: Leane Platt IMPLANT SECOND STAGE;  Surgeon: Sandra Gauze, MD;  Location: [facility];  Service: Urology;  Laterality: N/A;   INTERSTIM IMPLANT REVISION N/A 12/01/2021   Procedure: REVISION OF Leane Platt- generator anf lead replacement;  Surgeon: Sandra Gauze, MD;  Location: [facility];  Service: Urology;  Laterality: N/A;   LEFT HEART CATH AND CORONARY ANGIOGRAPHY N/A 05/31/2020   Procedure: LEFT HEART CATH AND CORONARY ANGIOGRAPHY;  Surgeon: Sandra Bihari, MD;  Location: [facility];  Service: Cardiovascular;  Laterality: N/A;   MULTIPLE TOOTH EXTRACTIONS  2015   OVARIAN CYST REMOVAL Right 2004 approx   SHOULDER OPEN ROTATOR CUFF REPAIR Left 04/20/2019   Procedure: ROTATOR CUFF REPAIR SHOULDER OPEN;  Surgeon: Sandra Hearing, MD;  Location: [facility];  Service: Orthopedics;  Laterality: Left;   TONSILLECTOMY  12/30/2004   TOTAL ABDOMINAL HYSTERECTOMY W/ BILATERAL SALPINGOOPHORECTOMY  05/16/2004   TRANSURETHRAL RESECTION OF BLADDER TUMOR N/A 10/03/2015   Procedure: TRANSURETHRAL RESECTION OF BLADDER TUMOR (TURBT);  Surgeon: Sandra Pippin, MD;  Location[facility];  Service: Urology;  Laterality: N/A;   TRANSURETHRAL RESECTION OF BLADDER TUMOR N/A 10/31/2015   Procedure: RE-STAGINGG TRANSURETHRAL RESECTION OF BLADDER TUMOR (TURBT);  Surgeon: Sandra Pippin, MD;  Location: [facility];  Service: Urology;  Laterality: N/A;   TRANSURETHRAL RESECTION OF BLADDER TUMOR N/A 08/17/2016   Procedure: TRANSURETHRAL RESECTION OF BLADDER TUMOR (TURBT);  Surgeon: Sandra Gauze, MD;  Location: [facility];  Service: Urology;  Laterality: N/A;   URETERAL BIOPSY Right 08/14/2021   Procedure: URETERAL BIOPSY;  Surgeon: Sandra Gauze, MD;  Location: [facility];  Service: Urology;  Laterality: Right;     Current Outpatient Medications  Medication Sig Dispense Refill   ACCU-CHEK  GUIDE test strip USE AS INSTRUCTED TO MONITOR GLUCOSE 4 TIMES DAILY BEFORE MEALS AND BEFORE BED. 400 strip 2   Accu-Chek Softclix Lancets lancets Use as instructed to monitor glucose 4 times daily 100 each 12   albuterol (PROAIR HFA) 108 (90 Base) MCG/ACT inhaler Inhale 2 puffs into the lungs every 6 (six) hours as needed for wheezing or shortness of breath. (Patient not taking: Reported on 04/23/2022) 18 g 2   amLODipine (NORVASC) 5 MG tablet Take 2 tablets (10 mg total) by mouth daily. 90 tablet 3   aspirin EC 81 MG tablet Take 1 tablet (81 mg total) by mouth daily with breakfast. Swallow whole. 30 tablet 11   benzonatate (TESSALON) 100 MG capsule Take 1 capsule (100 mg total) by mouth every 8 (eight) hours. (Patient not taking: Reported on 04/23/2022) 21 capsule 0   Blood Glucose Monitoring Suppl (ACCU-CHEK GUIDE) w/Device KIT 1 Piece by Does not apply route as directed. 1 kit 0   Blood Pressure Monitoring (BLOOD PRESSURE CUFF) MISC See admin instructions.  buPROPion (WELLBUTRIN XL) 300 MG 24 hr tablet Take 300 mg by mouth every morning.     cloNIDine HCl (KAPVAY) 0.1 MG TB12 ER tablet Take 0.1 mg by mouth at bedtime.     Continuous Blood Gluc Transmit (DEXCOM G6 TRANSMITTER) MISC 1 Device by Does not apply route every 3 (three) months. 3 each 1   Continuous Glucose Sensor (DEXCOM G6 SENSOR) MISC APPLY NEW SENSOR EVERY 10 DAYS AS DIRECTED. 3 each 2   cyclobenzaprine (FLEXERIL) 10 MG tablet Take 1 tablet (10 mg total) by mouth 3 (three) times daily as needed for muscle spasms. 5 tablet 0   Evolocumab (REPATHA SURECLICK) 140 MG/ML SOAJ Inject 1 Dose into the skin every 14 (fourteen) days. 2 mL 11   fenofibrate (TRICOR) 145 MG tablet Take 1 tablet (145 mg total) by mouth daily. 90 tablet 3   FLUoxetine (PROZAC) 20 MG capsule Take 20 mg by mouth at bedtime.     FLUoxetine (PROZAC) 40 MG capsule Take 40 mg by mouth in the morning.     gabapentin (NEURONTIN) 600 MG tablet Take 600 mg by mouth 3  (three) times daily.     GLOBAL EASE INJECT PEN NEEDLES 31G X 5 MM MISC Inject into the skin.     HYDROcodone-acetaminophen (NORCO) 10-325 MG tablet Take 1 tablet by mouth 3 (three) times daily as needed.     insulin aspart (NOVOLOG) 100 UNIT/ML injection Use with Omnipod for TDD around 150 units daily (Patient taking differently: Inject into the skin. Use with Omnipod for TDD around 150 units daily) 80 mL 6   Insulin Aspart FlexPen (NOVOLOG) 100 UNIT/ML INJECT 8-14 UNITS SUB-Q 3 TIMES A DAY WITH MEALS. 15 mL 0   Insulin Disposable Pump (OMNIPOD DASH PDM, GEN 4,) KIT Change pod every 48-72 hours 1 kit 0   Insulin Disposable Pump (OMNIPOD DASH PODS, GEN 4,) MISC Change pod every 48 hrs as directed 15 each 6   insulin glargine (LANTUS SOLOSTAR) 100 UNIT/ML Solostar Pen Inject 35 Units into the skin at bedtime. 15 mL 0   Insulin Pen Needle (PEN NEEDLES) 31G X 6 MM MISC Use to inject insulin 4 times daily 100 each 11   INSULIN SYRINGE 1CC/29G 29G X 1/2" 1 ML MISC Use to inject insulin into Omnipod 100 each 6   isosorbide mononitrate (IMDUR) 30 MG 24 hr tablet Take 1 tablet (30 mg total) by mouth daily. 90 tablet 3   linaclotide (LINZESS) 72 MCG capsule Take 1 capsule (72 mcg total) by mouth daily before breakfast. 30 capsule 5   metoprolol succinate (TOPROL-XL) 50 MG 24 hr tablet Take 1 tablet (50 mg total) by mouth daily. Take with or immediately following a meal. 90 tablet 3   nitroGLYCERIN (NITROSTAT) 0.4 MG SL tablet PLACE 1 TAB UNDER TONGUE EVERY 3 MINUTES AS DIRECTED UP TO 3 TIMES AS NEEDED FOR CHEST PAIN. (Patient taking differently: Place 0.4 mg under the tongue every 5 (five) minutes as needed for chest pain.) 25 tablet 0   pantoprazole (PROTONIX) 40 MG tablet Take 1 tablet (40 mg total) by mouth 2 (two) times daily before a meal. 60 tablet 5   rosuvastatin (CRESTOR) 40 MG tablet Take 1 tablet (40 mg total) by mouth daily. (Patient not taking: Reported on 04/23/2022) 90 tablet 3   tamsulosin  (FLOMAX) 0.4 MG CAPS capsule Take 1 capsule (0.4 mg total) by mouth at bedtime. (Patient taking differently: Take 0.4 mg by mouth daily.) 30 capsule 3  VYVANSE 50 MG capsule Take 50 mg by mouth daily.     No current facility-administered medications for this visit.    Allergies:   Ibuprofen, Benazepril, Diphenhydramine, Latex, Metronidazole, Oxycodone, Toradol [ketorolac tromethamine], Zofran [ondansetron hcl], Adhesive [tape], Codeine, Loratadine, Nystatin-triamcinolone, and Penicillins    Social History:  The patient  reports that she quit smoking about 16 months ago. Her smoking use included cigarettes. She has a 5.00 pack-year smoking history. She has never used smokeless tobacco. She reports that she does not drink alcohol and does not use drugs.   Family History:  The patient's ***family history includes Breast cancer in her maternal aunt; Cancer in her mother and another family member; Diabetes in her daughter; Hypertension in her mother; Lung cancer in her maternal uncle.    ROS:  General:no colds or fevers, no weight changes Skin:no rashes or ulcers HEENT:no blurred vision, no congestion CV:see HPI PUL:see HPI GI:no diarrhea constipation or melena, no indigestion GU:no hematuria, no dysuria MS:no joint pain, no claudication Neuro:no syncope, no lightheadedness Endo:no diabetes, no thyroid disease Wt Readings from Last 3 Encounters:  04/23/22 170 lb (77.1 kg)  04/17/22 180 lb 12.8 oz (82 kg)  03/05/22 176 lb (79.8 kg)     PHYSICAL EXAM: VS:  There were no vitals taken for this visit. , BMI There is no height or weight on file to calculate BMI. General:Pleasant affect, NAD Skin:Warm and dry, brisk capillary refill HEENT:normocephalic, sclera clear, mucus membranes moist Neck:supple, no JVD, no bruits  Heart:S1S2 RRR without murmur, gallup, rub or click Lungs:clear without rales, rhonchi, or wheezes NIO:EVOJ, non tender, + BS, do not palpate liver spleen or masses Ext:no  lower ext edema, 2+ pedal pulses, 2+ radial pulses Neuro:alert and oriented, MAE, follows commands, + facial symmetry    EKG:  EKG is ordered today. The ekg ordered today demonstrates ***   Recent Labs: 07/21/2021: Magnesium 1.9 04/23/2022: ALT 28; B Natriuretic Peptide 15.0; BUN 15; Creatinine, Ser 0.75; Hemoglobin 12.8; Platelets 378; Potassium 3.9; Sodium 131    Lipid Panel    Component Value Date/Time   CHOL 175 07/20/2021 0759   CHOL 246 (H) 06/05/2020 0853   TRIG 245 (H) 07/20/2021 0759   HDL 27 (L) 07/20/2021 0759   HDL 31 (L) 06/05/2020 0853   CHOLHDL 6.5 07/20/2021 0759   VLDL 49 (H) 07/20/2021 0759   LDLCALC 99 07/20/2021 0759   LDLCALC 163 (H) 06/05/2020 0853       Other studies Reviewed: Additional studies/ records that were reviewed today include: ***.   ASSESSMENT AND PLAN:  1.  ***   Current medicines are reviewed with the patient today.  The patient Has no concerns regarding medicines.  The following changes have been made:  See above Labs/ tests ordered today include:see above  Disposition:   FU:  see above  Signed, Nada Boozer, NP  06/05/2022 2:39 PM    [facility] [address] [place], [place], [place]  [address]/ [address] [place], [place] Phone: [phone]; Fax: [phone]  [phone]

## 2022-06-09 ENCOUNTER — Ambulatory Visit: Payer: Medicaid Other | Attending: Cardiology | Admitting: Cardiology

## 2022-06-09 ENCOUNTER — Encounter: Payer: Self-pay | Admitting: Cardiology

## 2022-06-09 ENCOUNTER — Other Ambulatory Visit: Payer: Self-pay | Admitting: Nurse Practitioner

## 2022-06-11 ENCOUNTER — Ambulatory Visit: Payer: Medicaid Other | Admitting: Nurse Practitioner

## 2022-06-11 ENCOUNTER — Encounter: Payer: Self-pay | Admitting: Cardiology

## 2022-06-15 ENCOUNTER — Ambulatory Visit (INDEPENDENT_AMBULATORY_CARE_PROVIDER_SITE_OTHER): Payer: Self-pay | Admitting: Nurse Practitioner

## 2022-06-15 ENCOUNTER — Encounter: Payer: Self-pay | Admitting: Nurse Practitioner

## 2022-06-15 VITALS — BP 120/76 | HR 88 | Ht 60.0 in | Wt 188.0 lb

## 2022-06-15 DIAGNOSIS — Z794 Long term (current) use of insulin: Secondary | ICD-10-CM

## 2022-06-15 DIAGNOSIS — E1165 Type 2 diabetes mellitus with hyperglycemia: Secondary | ICD-10-CM

## 2022-06-15 DIAGNOSIS — E782 Mixed hyperlipidemia: Secondary | ICD-10-CM

## 2022-06-15 DIAGNOSIS — I10 Essential (primary) hypertension: Secondary | ICD-10-CM

## 2022-06-15 LAB — POCT GLYCOSYLATED HEMOGLOBIN (HGB A1C): Hemoglobin A1C: 7.8 % — AB (ref 4.0–5.6)

## 2022-06-15 MED ORDER — INSULIN ASPART 100 UNIT/ML IJ SOLN: INTRAMUSCULAR | 6 refills | Status: DC

## 2022-06-15 MED ORDER — OMNIPOD DASH PODS (GEN 4) MISC: 3 refills | Status: DC

## 2022-06-15 MED ORDER — FLUTICASONE PROPIONATE 50 MCG/ACT NA SUSP: 2.0000 | Freq: Every day | NASAL | 6 refills | Status: AC
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
## 2022-07-24 ENCOUNTER — Other Ambulatory Visit: Payer: Self-pay | Admitting: Nurse Practitioner

## 2022-07-30 ENCOUNTER — Other Ambulatory Visit: Payer: Self-pay | Admitting: Nurse Practitioner

## 2022-07-30 DIAGNOSIS — E1165 Type 2 diabetes mellitus with hyperglycemia: Secondary | ICD-10-CM

## 2022-08-10 ENCOUNTER — Other Ambulatory Visit: Payer: Self-pay | Admitting: Nurse Practitioner

## 2022-08-10 ENCOUNTER — Telehealth: Payer: Self-pay | Admitting: Nurse Practitioner

## 2022-08-10 ENCOUNTER — Encounter: Payer: Self-pay | Admitting: Nurse Practitioner

## 2022-08-10 DIAGNOSIS — E1165 Type 2 diabetes mellitus with hyperglycemia: Secondary | ICD-10-CM

## 2022-08-10 NOTE — Telephone Encounter (Signed)
See message f rom patient

## 2022-08-10 NOTE — Telephone Encounter (Signed)
Pt state she needs a PA on her dexcom G6 sensors. Have you seen anything?

## 2022-08-11 ENCOUNTER — Telehealth: Payer: Self-pay

## 2022-08-11 ENCOUNTER — Other Ambulatory Visit (HOSPITAL_COMMUNITY): Payer: Self-pay

## 2022-08-11 NOTE — Telephone Encounter (Signed)
Pharmacy Patient Advocate Encounter   Received notification from Pt Calls Messages that prior authorization for Dexcom G6 sensor is required/requested.   Insurance verification completed.   The patient is insured through [employer] [place] .   Per test claim: PA required; PA submitted to [employer] via CoverMyMeds Key/confirmation #/EOC [id number] Status is pending

## 2022-08-11 NOTE — Telephone Encounter (Signed)
Patient states Dexcom is needing PA, says it was requested over a week ago.  Can you please check on this for her?

## 2022-08-13 NOTE — Telephone Encounter (Signed)
Pharmacy Patient Advocate Encounter  Received notification from [facility] that Prior Authorization for Dexcom G6 sensor has been APPROVED from 08/11/22 to 02/11/2023  PA #/Case ID/Reference #: [id number]

## 2022-08-13 NOTE — Telephone Encounter (Signed)
Patient was called and made aware. 

## 2022-08-25 ENCOUNTER — Other Ambulatory Visit: Payer: Self-pay | Admitting: Urology

## 2022-08-25 ENCOUNTER — Other Ambulatory Visit: Payer: Self-pay | Admitting: Internal Medicine

## 2022-08-25 DIAGNOSIS — K219 Gastro-esophageal reflux disease without esophagitis: Secondary | ICD-10-CM

## 2022-08-30 NOTE — Telephone Encounter (Signed)
Sending in refill. Patient is due for routine follow-up. Please arrange.

## 2022-09-06 NOTE — Progress Notes (Deleted)
Referring Provider: Wilmon Pali, FNP Primary Care Physician:  Wilmon Pali, FNP Primary GI Physician: Dr. Bonnetta Barry chief complaint on file.   HPI:   Sandra [name] is a 47 y.o. female with GI history of GERD, Barrett's esophagus, dysphagia, rectal bleeding in the setting of hemorrhoids, constipation, presenting today for follow-up   Last seen in office 06/18/2021.  GERD well-controlled on Protonix 40 mg twice daily.  Dysphagia improved following empiric dilation in February 2023, but still having trouble with pills and meats getting hung at the sternal notch.  Constipation well-controlled on Linzess 72 mcg.  Prior rectal bleeding resolved with Anusol rectal cream and better management of constipation.  Recommended continuing current medications, barium pill esophagram to evaluate dysphagia.  Pill esophagram was never completed.  Today:   EGD 02/17/2021: 2 cm hiatal hernia, esophageal mucosal changes consistent with short segment Barrett's s/p biopsy, esophagus also dilated, gastritis biopsied, normal examined duodenum.  Pathology with reactive gastropathy, negative for H. pylori, esophageal squamous and cardiac mucosa with intestinal metaplasia, consistent with Barrett's esophagus.  Recommended repeat EGD in 3 years.   Colonoscopy 02/17/2021: Nonbleeding internal hemorrhoids, otherwise normal exam.  Recommended considering hemorrhoid banding if rectal bleeding continues despite conservative measures.  Past Medical History:  Diagnosis Date   ADHD (attention deficit hyperactivity disorder)    Allergic rhinitis    Anemia    hx of   Anxiety    Arthritis    Barrett's esophagus    Bipolar 1 disorder (HCC)    Bladder tumor    CAD (coronary artery disease)    Cancer (HCC)    bladder   Constipation    Depression    Dilated cardiomyopathy (HCC) 12/20/2020   Echo 6/22 ([facility]): EF 43, mild MR, mild TR // Echo 3/22: EF 50-55   Dizziness    Full dentures    GERD (gastroesophageal  reflux disease)    Gross hematuria    Hyperlipidemia    Hypertension    Stable angina    Type 2 diabetes mellitus (HCC)    Urgency of urination    dysuria, sui   Wears glasses     Past Surgical History:  Procedure Laterality Date   ABDOMINAL HYSTERECTOMY     ANTERIOR CERVICAL DECOMP/DISCECTOMY FUSION N/A 03/05/2022   Procedure: ANTERIOR CERVICAL DECOMPRESSION/DISCECTOMY FUSION 1 LEVEL; C6-C7;  Surgeon: Venita Lick, MD;  Location: [facility];  Service: Orthopedics;  Laterality: N/A;  3 hrs 3 C-Bed   BALLOON DILATION N/A 02/17/2021   Procedure: BALLOON DILATION;  Surgeon: Lanelle Bal, DO;  Location: [facility];  Service: Endoscopy;  Laterality: N/A;   BIOPSY  02/17/2021   Procedure: BIOPSY;  Surgeon: Lanelle Bal, DO;  Location: [facility];  Service: Endoscopy;;   COLONOSCOPY WITH PROPOFOL N/A 02/17/2021   Surgeon: Lanelle Bal, DO; nonbleeding internal hemorrhoids, otherwise normal exam.  Repeat in 10 years.   CYSTOSCOPY N/A 10/03/2015   Procedure: CYSTOSCOPY;  Surgeon: Bjorn Pippin, MD;  [facility];  Service: Urology;  Laterality: N/A;   CYSTOSCOPY W/ URETERAL STENT PLACEMENT Right 07/24/2021   Procedure: CYSTOSCOPY WITH RETROGRADE PYELOGRAM/URETERAL STENT PLACEMENT;  Surgeon: Malen Gauze, MD;  Location: [facility];  Service: Urology;  Laterality: Right;   CYSTOSCOPY WITH RETROGRADE PYELOGRAM, URETEROSCOPY AND STENT PLACEMENT Right 08/14/2021   Procedure: CYSTOSCOPY WITH RETROGRADE PYELOGRAM, URETEROSCOPY AND STENT EXCHANGE;  Surgeon: Malen Gauze, MD;  Location: [facility];  Service: Urology;  Laterality: Right;  CYSTOSCOPY WITH STENT PLACEMENT Right 10/31/2015   Procedure: CYSTOSCOPY WITH ATTEMPTED RIGHT URETERAL OPENING;  Surgeon: Bjorn Pippin, MD;  Location: [facility];  Service: Urology;  Laterality: Right;   ESOPHAGOGASTRODUODENOSCOPY (EGD) WITH PROPOFOL N/A 02/17/2021   Surgeon: Lanelle Bal, DO; 2 cm hiatal  hernia, Barrett's esophagus, gastritis with biopsies negative for H. pylori.  S/p empiric esophageal dilation.  Repeat in 3 years.   FOOT SURGERY Left    September 2022, broke 7 bones in left foot   INTERSTIM IMPLANT PLACEMENT N/A 03/14/2018   Procedure: Leane Platt IMPLANT FIRST STAGE;  Surgeon: Malen Gauze, MD;  Location: [facility];  Service: Urology;  Laterality: N/A;   INTERSTIM IMPLANT PLACEMENT N/A 03/30/2018   Procedure: Leane Platt IMPLANT SECOND STAGE;  Surgeon: Malen Gauze, MD;  Location: [facility];  Service: Urology;  Laterality: N/A;   INTERSTIM IMPLANT REVISION N/A 12/01/2021   Procedure: REVISION OF Leane Platt- generator anf lead replacement;  Surgeon: Malen Gauze, MD;  Location: [facility];  Service: Urology;  Laterality: N/A;   LEFT HEART CATH AND CORONARY ANGIOGRAPHY N/A 05/31/2020   Procedure: LEFT HEART CATH AND CORONARY ANGIOGRAPHY;  Surgeon: Lennette Bihari, MD;  Location: [facility];  Service: Cardiovascular;  Laterality: N/A;   MULTIPLE TOOTH EXTRACTIONS  2015   OVARIAN CYST REMOVAL Right 2004 approx   SHOULDER OPEN ROTATOR CUFF REPAIR Left 04/20/2019   Procedure: ROTATOR CUFF REPAIR SHOULDER OPEN;  Surgeon: Vickki Hearing, MD;  Location: [facility];  Service: Orthopedics;  Laterality: Left;   TONSILLECTOMY  12/30/2004   TOTAL ABDOMINAL HYSTERECTOMY W/ BILATERAL SALPINGOOPHORECTOMY  05/16/2004   TRANSURETHRAL RESECTION OF BLADDER TUMOR N/A 10/03/2015   Procedure: TRANSURETHRAL RESECTION OF BLADDER TUMOR (TURBT);  Surgeon: Bjorn Pippin, MD;  Location: [facility];  Service: Urology;  Laterality: N/A;   TRANSURETHRAL RESECTION OF BLADDER TUMOR N/A 10/31/2015   Procedure: RE-STAGINGG TRANSURETHRAL RESECTION OF BLADDER TUMOR (TURBT);  Surgeon: Bjorn Pippin, MD;  Location: [facility];  Service: Urology;  Laterality: N/A;   TRANSURETHRAL RESECTION OF BLADDER TUMOR N/A 08/17/2016   Procedure: TRANSURETHRAL RESECTION OF BLADDER TUMOR  (TURBT);  Surgeon: Malen Gauze, MD;  Location: [facility];  Service: Urology;  Laterality: N/A;   URETERAL BIOPSY Right 08/14/2021   Procedure: URETERAL BIOPSY;  Surgeon: Malen Gauze, MD;  Location: [facility];  Service: Urology;  Laterality: Right;    Current Outpatient Medications  Medication Sig Dispense Refill   ACCU-CHEK GUIDE test strip USE AS INSTRUCTED TO MONITOR GLUCOSE 4 TIMES DAILY BEFORE MEALS AND BEFORE BED. 400 strip 2   Accu-Chek Softclix Lancets lancets Use as instructed to monitor glucose 4 times daily 100 each 12   albuterol (PROAIR HFA) 108 (90 Base) MCG/ACT inhaler Inhale 2 puffs into the lungs every 6 (six) hours as needed for wheezing or shortness of breath. 18 g 2   amLODipine (NORVASC) 5 MG tablet Take 2 tablets (10 mg total) by mouth daily. 90 tablet 3   aspirin EC 81 MG tablet Take 1 tablet (81 mg total) by mouth daily with breakfast. Swallow whole. 30 tablet 11   benzonatate (TESSALON) 100 MG capsule Take 1 capsule (100 mg total) by mouth every 8 (eight) hours. (Patient not taking: Reported on 06/15/2022) 21 capsule 0   Blood Glucose Monitoring Suppl (ACCU-CHEK GUIDE) w/Device KIT 1 Piece by Does not apply route as directed. 1 kit 0   Blood Pressure Monitoring (BLOOD PRESSURE CUFF) MISC See admin instructions.  buPROPion (WELLBUTRIN XL) 300 MG 24 hr tablet Take 300 mg by mouth every morning.     cloNIDine HCl (KAPVAY) 0.1 MG TB12 ER tablet Take 0.1 mg by mouth at bedtime.     Continuous Blood Gluc Transmit (DEXCOM G6 TRANSMITTER) MISC 1 Device by Does not apply route every 3 (three) months. 3 each 1   Continuous Glucose Sensor (DEXCOM G6 SENSOR) MISC apply new sensor every 10 days as directed. 3 each 2   cyclobenzaprine (FLEXERIL) 10 MG tablet Take 1 tablet (10 mg total) by mouth 3 (three) times daily as needed for muscle spasms. 5 tablet 0   Evolocumab (REPATHA SURECLICK) 140 MG/ML SOAJ Inject 1 Dose into the skin every 14 (fourteen) days. 2 mL 11    fenofibrate (TRICOR) 145 MG tablet Take 1 tablet (145 mg total) by mouth daily. 90 tablet 3   FLUoxetine (PROZAC) 20 MG capsule Take 20 mg by mouth at bedtime.     FLUoxetine (PROZAC) 40 MG capsule Take 40 mg by mouth in the morning.     fluticasone (FLONASE) 50 MCG/ACT nasal spray Place 2 sprays into both nostrils daily. 16 g 6   gabapentin (NEURONTIN) 600 MG tablet Take 600 mg by mouth 3 (three) times daily.     GLOBAL EASE INJECT PEN NEEDLES 31G X 5 MM MISC Inject into the skin.     HYDROcodone-acetaminophen (NORCO) 10-325 MG tablet Take 1 tablet by mouth 3 (three) times daily as needed.     insulin aspart (NOVOLOG) 100 UNIT/ML injection Use with Omnipod for TDD around 150 units daily 80 mL 6   Insulin Aspart FlexPen (NOVOLOG) 100 UNIT/ML INJECT 8-14 UNITS SUB-Q 3 TIMES A DAY WITH MEALS. 15 mL 0   Insulin Disposable Pump (OMNIPOD DASH PDM, GEN 4,) KIT Change pod every 48-72 hours 1 kit 0   Insulin Disposable Pump (OMNIPOD DASH PODS, GEN 4,) MISC change pod every 48 hours as directed. 15 each 0   Insulin Pen Needle (PEN NEEDLES) 31G X 6 MM MISC Use to inject insulin 4 times daily 100 each 11   INSULIN SYRINGE 1CC/29G 29G X 1/2" 1 ML MISC Use to inject insulin into Omnipod 100 each 6   isosorbide mononitrate (IMDUR) 30 MG 24 hr tablet Take 1 tablet (30 mg total) by mouth daily. 90 tablet 3   linaclotide (LINZESS) 72 MCG capsule Take 1 capsule (72 mcg total) by mouth daily before breakfast. 30 capsule 5   metoprolol succinate (TOPROL-XL) 50 MG 24 hr tablet Take 1 tablet (50 mg total) by mouth daily. Take with or immediately following a meal. 90 tablet 3   nitroGLYCERIN (NITROSTAT) 0.4 MG SL tablet PLACE 1 TAB UNDER TONGUE EVERY 3 MINUTES AS DIRECTED UP TO 3 TIMES AS NEEDED FOR CHEST PAIN. (Patient taking differently: Place 0.4 mg under the tongue every 5 (five) minutes as needed for chest pain.) 25 tablet 0   pantoprazole (PROTONIX) 40 MG tablet take 1 tablet twice daily before a meal 60 tablet 2    rosuvastatin (CRESTOR) 40 MG tablet Take 1 tablet (40 mg total) by mouth daily. (Patient not taking: Reported on 04/23/2022) 90 tablet 3   tamsulosin (FLOMAX) 0.4 MG CAPS capsule TAKE 1 CAPSULE BY MOUTH AT BEDTIME. 90 capsule 3   VYVANSE 50 MG capsule Take 50 mg by mouth daily.     No current facility-administered medications for this visit.    Allergies as of 09/09/2022 - Review Complete 06/15/2022  Allergen Reaction Noted  Ibuprofen Hives and Itching 05/12/2016   Benazepril Hives 11/25/2014   Diphenhydramine Hives 04/04/2020   Latex Itching and Dermatitis 07/24/2021   Metronidazole Nausea And Vomiting 11/05/2020   Oxycodone  08/19/2016   Toradol [ketorolac tromethamine] Itching 07/22/2021   Zofran [ondansetron hcl] Hives 11/27/2021   Adhesive [tape] Rash 09/30/2015   Codeine Hives, Nausea And Vomiting, and Rash 02/11/2021   Loratadine Rash    Nystatin-triamcinolone Rash 02/24/2018   Penicillins Rash 11/05/2020    Family History  Problem Relation Age of Onset   Hypertension Mother    Cancer Mother    Breast cancer Maternal Aunt    Lung cancer Maternal Uncle    Cancer Other    Diabetes Daughter    Colon cancer Neg Hx     Social History   Socioeconomic History   Marital status: Married    Spouse name: Not on file   Number of children: Not on file   Years of education: Not on file   Highest education level: Not on file  Occupational History   Not on file  Tobacco Use   Smoking status: Former    Current packs/day: 0.00    Average packs/day: 0.5 packs/day for 10.0 years (5.0 ttl pk-yrs)    Types: Cigarettes    Start date: 02/01/2011    Quit date: 01/31/2021    Years since quitting: 1.5   Smokeless tobacco: Never   Tobacco comments:    02/06/21-no smoking past 5 days  Vaping Use   Vaping status: Never Used  Substance and Sexual Activity   Alcohol use: No   Drug use: No   Sexual activity: Not Currently    Birth control/protection: Surgical    Comment: hyst   Other Topics Concern   Not on file  Social History Narrative   Not on file   Social Determinants of Health   Financial Resource Strain: Not on file  Food Insecurity: Not on file  Transportation Needs: No Transportation Needs (06/19/2020)   Received from [facility]   PRAPARE - Transportation    In the past 12 months, has lack of transportation kept you from medical appointments or from getting medications?: No    Lack of Transportation (Non-Medical): No  Physical Activity: Not on file  Stress: Not on file  Social Connections: Unknown (05/27/2021)   Received from [facility] [place]   Social Network    Social Network: Not on file    Review of Systems: Gen: Denies fever, chills, anorexia. Denies fatigue, weakness, weight loss.  CV: Denies chest pain, palpitations, syncope, peripheral edema, and claudication. Resp: Denies dyspnea at rest, cough, wheezing, coughing up blood, and pleurisy. GI: Denies vomiting blood, jaundice, and fecal incontinence.   Denies dysphagia or odynophagia. Derm: Denies rash, itching, dry skin Psych: Denies depression, anxiety, memory loss, confusion. No homicidal or suicidal ideation.  Heme: Denies bruising, bleeding, and enlarged lymph nodes.  Physical Exam: There were no vitals taken for this visit. General:   Alert and oriented. No distress noted. Pleasant and cooperative.  Head:  Normocephalic and atraumatic. Eyes:  Conjuctiva clear without scleral icterus. Heart:  S1, S2 present without murmurs appreciated. Lungs:  Clear to auscultation bilaterally. No wheezes, rales, or rhonchi. No distress.  Abdomen:  +BS, soft, non-tender and non-distended. No rebound or guarding. No HSM or masses noted. Msk:  Symmetrical without gross deformities. Normal posture. Extremities:  Without edema. Neurologic:  Alert and  oriented x4 Psych:  Normal mood and affect.  Assessment:     Plan:  ***   Ermalinda Memos,  PA-C [facility] 09/09/2022

## 2022-09-07 ENCOUNTER — Encounter: Payer: Self-pay | Admitting: Neurology

## 2022-09-09 ENCOUNTER — Ambulatory Visit: Payer: MEDICAID | Admitting: Gastroenterology

## 2022-09-09 ENCOUNTER — Other Ambulatory Visit: Payer: Self-pay | Admitting: Nurse Practitioner

## 2022-09-15 ENCOUNTER — Encounter: Payer: Self-pay | Admitting: Nurse Practitioner

## 2022-09-15 ENCOUNTER — Ambulatory Visit (INDEPENDENT_AMBULATORY_CARE_PROVIDER_SITE_OTHER): Payer: MEDICAID | Admitting: Nurse Practitioner

## 2022-09-15 VITALS — BP 136/87 | HR 105 | Ht 60.0 in | Wt 185.4 lb

## 2022-09-15 DIAGNOSIS — E782 Mixed hyperlipidemia: Secondary | ICD-10-CM

## 2022-09-15 DIAGNOSIS — I10 Essential (primary) hypertension: Secondary | ICD-10-CM

## 2022-09-15 DIAGNOSIS — Z794 Long term (current) use of insulin: Secondary | ICD-10-CM | POA: Diagnosis not present

## 2022-09-15 DIAGNOSIS — E1165 Type 2 diabetes mellitus with hyperglycemia: Secondary | ICD-10-CM

## 2022-09-15 LAB — POCT GLYCOSYLATED HEMOGLOBIN (HGB A1C): Hemoglobin A1C: 7.1 % — AB (ref 4.0–5.6)

## 2022-09-15 MED ORDER — OMNIPOD DASH PODS (GEN 4) MISC: 3 refills | Status: DC

## 2022-09-15 MED ORDER — INSULIN ASPART 100 UNIT/ML IJ SOLN: INTRAMUSCULAR | 6 refills | Status: DC

## 2022-09-15 NOTE — Progress Notes (Signed)
09/15/2022, 2:30 PM                   Endocrinology follow-up note   Subjective:    Patient ID: Sandra [name], female    DOB: 1975/03/27.  Sandra [name] is being seen in follow-up  for management of currently uncontrolled symptomatic type 2  diabetes requested by  Wilmon Pali, FNP.   Past Medical History:  Diagnosis Date   ADHD (attention deficit hyperactivity disorder)    Allergic rhinitis    Anemia    hx of   Anxiety    Arthritis    Barrett's esophagus    Bipolar 1 disorder (HCC)    Bladder tumor    CAD (coronary artery disease)    Cancer (HCC)    bladder   Constipation    Depression    Dilated cardiomyopathy (HCC) 12/20/2020   Echo 6/22 ([facility]): EF 43, mild MR, mild TR // Echo 3/22: EF 50-55   Dizziness    Full dentures    GERD (gastroesophageal reflux disease)    Gross hematuria    Hyperlipidemia    Hypertension    Stable angina    Type 2 diabetes mellitus (HCC)    Urgency of urination    dysuria, sui   Wears glasses     Past Surgical History:  Procedure Laterality Date   ABDOMINAL HYSTERECTOMY     ANTERIOR CERVICAL DECOMP/DISCECTOMY FUSION N/A 03/05/2022   Procedure: ANTERIOR CERVICAL DECOMPRESSION/DISCECTOMY FUSION 1 LEVEL; C6-C7;  Surgeon: Venita Lick, MD;  Location: [facility];  Service: Orthopedics;  Laterality: N/A;  3 hrs 3 C-Bed   BALLOON DILATION N/A 02/17/2021   Procedure: BALLOON DILATION;  Surgeon: Lanelle Bal, DO;  Location: [facility];  Service: Endoscopy;  Laterality: N/A;   BIOPSY  02/17/2021   Procedure: BIOPSY;  Surgeon: Lanelle Bal, DO;  [facility]: [facility];  Service: Endoscopy;;   COLONOSCOPY WITH PROPOFOL N/A 02/17/2021   Surgeon: Lanelle Bal, DO; nonbleeding internal hemorrhoids, otherwise normal exam.  Repeat in 10 years.   CYSTOSCOPY N/A 10/03/2015   Procedure: CYSTOSCOPY;  Surgeon: Bjorn Pippin, MD;  Location: [facility];  Service: Urology;  Laterality: N/A;   CYSTOSCOPY W/ URETERAL STENT PLACEMENT Right 07/24/2021   Procedure: CYSTOSCOPY WITH RETROGRADE PYELOGRAM/URETERAL STENT PLACEMENT;  Surgeon: Malen Gauze, MD;  Location: [facility];  Service: Urology;  Laterality: Right;   CYSTOSCOPY WITH RETROGRADE PYELOGRAM, URETEROSCOPY AND STENT PLACEMENT Right 08/14/2021   Procedure: CYSTOSCOPY WITH RETROGRADE PYELOGRAM, URETEROSCOPY AND STENT EXCHANGE;  Surgeon: Malen Gauze, MD;  Location: [facility];  Service: Urology;  Laterality: Right;   CYSTOSCOPY WITH STENT PLACEMENT Right 10/31/2015   Procedure: CYSTOSCOPY WITH ATTEMPTED RIGHT URETERAL OPENING;  Surgeon: Bjorn Pippin, MD;  Location: [facility];  Service: Urology;  Laterality: Right;   ESOPHAGOGASTRODUODENOSCOPY (EGD) WITH PROPOFOL N/A 02/17/2021   Surgeon: Lanelle Bal, DO; 2 cm hiatal hernia, Barrett's esophagus, gastritis with biopsies negative for H. pylori.  S/p empiric esophageal dilation.  Repeat in 3 years.   FOOT SURGERY Left    September 2022, broke 7 bones in left foot  INTERSTIM IMPLANT PLACEMENT N/A 03/14/2018   Procedure: Leane Platt IMPLANT FIRST STAGE;  Surgeon: Malen Gauze, MD;  Location: [facility];  Service: Urology;  Laterality: N/A;   INTERSTIM IMPLANT PLACEMENT N/A 03/30/2018   Procedure: Leane Platt IMPLANT SECOND STAGE;  Surgeon: Malen Gauze, MD;  Location: [facility];  Service: Urology;  Laterality: N/A;   INTERSTIM IMPLANT REVISION N/A 12/01/2021   Procedure: REVISION OF Leane Platt- generator anf lead replacement;  Surgeon: Malen Gauze, MD;  Location: [facility];  Service: Urology;  Laterality: N/A;   LEFT HEART CATH AND CORONARY ANGIOGRAPHY N/A 05/31/2020   Procedure: LEFT HEART CATH AND CORONARY ANGIOGRAPHY;  Surgeon: Lennette Bihari, MD;  [facility]: [facility];  Service: Cardiovascular;  Laterality: N/A;   MULTIPLE TOOTH EXTRACTIONS  2015   OVARIAN CYST REMOVAL Right 2004 approx    SHOULDER OPEN ROTATOR CUFF REPAIR Left 04/20/2019   Procedure: ROTATOR CUFF REPAIR SHOULDER OPEN;  Surgeon: Vickki Hearing, MD;  Location: [facility];  Service: Orthopedics;  Laterality: Left;   TONSILLECTOMY  12/30/2004   TOTAL ABDOMINAL HYSTERECTOMY W/ BILATERAL SALPINGOOPHORECTOMY  05/16/2004   TRANSURETHRAL RESECTION OF BLADDER TUMOR N/A 10/03/2015   Procedure: TRANSURETHRAL RESECTION OF BLADDER TUMOR (TURBT);  Surgeon: Bjorn Pippin, MD;  Location: [facility];  Service: Urology;  Laterality: N/A;   TRANSURETHRAL RESECTION OF BLADDER TUMOR N/A 10/31/2015   Procedure: RE-STAGINGG TRANSURETHRAL RESECTION OF BLADDER TUMOR (TURBT);  Surgeon: Bjorn Pippin, MD;  Location: [facility];  Service: Urology;  Laterality: N/A;   TRANSURETHRAL RESECTION OF BLADDER TUMOR N/A 08/17/2016   Procedure: TRANSURETHRAL RESECTION OF BLADDER TUMOR (TURBT);  Surgeon: Malen Gauze, MD;  Location: [facility];  Service: Urology;  Laterality: N/A;   URETERAL BIOPSY Right 08/14/2021   Procedure: URETERAL BIOPSY;  Surgeon: Malen Gauze, MD;  Location: [facility];  Service: Urology;  Laterality: Right;    Social History   Socioeconomic History   Marital status: Married    Spouse name: Not on file   Number of children: Not on file   Years of education: Not on file   Highest education level: Not on file  Occupational History   Not on file  Tobacco Use   Smoking status: Former    Current packs/day: 0.00    Average packs/day: 0.5 packs/day for 10.0 years (5.0 ttl pk-yrs)    Types: Cigarettes    Start date: 02/01/2011    Quit date: 01/31/2021    Years since quitting: 1.6   Smokeless tobacco: Never   Tobacco comments:    02/06/21-no smoking past 5 days  Vaping Use   Vaping status: Never Used  Substance and Sexual Activity   Alcohol use: No   Drug use: No   Sexual activity: Not Currently    Birth control/protection: Surgical    Comment: hyst  Other Topics Concern   Not on file   Social History Narrative   Not on file   Social Determinants of Health   Financial Resource Strain: Not on file  Food Insecurity: Not on file  Transportation Needs: No Transportation Needs (06/19/2020)   Received from [facility], [facility]   PRAPARE - Transportation    In the past 12 months, has lack of transportation kept you from medical appointments or from getting medications?: No    Lack of Transportation (Non-Medical): No  Physical Activity: Not on file  Stress: Not on file  Social Connections: Unknown (05/27/2021)   Received from  [facility]   Social Network    Social Network: Not on file    Family History  Problem Relation Age of Onset   Hypertension Mother    Cancer Mother    Breast cancer Maternal Aunt    Lung cancer Maternal Uncle    Cancer Other    Diabetes Daughter    Colon cancer Neg Hx     Outpatient Encounter Medications as of 09/15/2022  Medication Sig   ACCU-CHEK GUIDE test strip USE AS INSTRUCTED TO MONITOR GLUCOSE 4 TIMES DAILY BEFORE MEALS AND BEFORE BED.   Accu-Chek Softclix Lancets lancets Use as instructed to monitor glucose 4 times daily   albuterol (PROAIR HFA) 108 (90 Base) MCG/ACT inhaler Inhale 2 puffs into the lungs every 6 (six) hours as needed for wheezing or shortness of breath.   amLODipine (NORVASC) 5 MG tablet Take 2 tablets (10 mg total) by mouth daily.   aspirin EC 81 MG tablet Take 1 tablet (81 mg total) by mouth daily with breakfast. Swallow whole.   Blood Glucose Monitoring Suppl (ACCU-CHEK GUIDE) w/Device KIT 1 Piece by Does not apply route as directed.   Blood Pressure Monitoring (BLOOD PRESSURE CUFF) MISC See admin instructions.   buPROPion (WELLBUTRIN XL) 300 MG 24 hr tablet Take 300 mg by mouth every morning.   cloNIDine HCl (KAPVAY) 0.1 MG TB12 ER tablet Take 0.1 mg by mouth at bedtime.   Continuous Blood Gluc Transmit (DEXCOM G6 TRANSMITTER) MISC 1 Device by Does not apply route every 3  (three) months.   Continuous Glucose Sensor (DEXCOM G6 SENSOR) MISC apply new sensor every 10 days as directed.   cyclobenzaprine (FLEXERIL) 10 MG tablet Take 1 tablet (10 mg total) by mouth 3 (three) times daily as needed for muscle spasms.   Evolocumab (REPATHA SURECLICK) 140 MG/ML SOAJ Inject 1 Dose into the skin every 14 (fourteen) days.   FLUoxetine (PROZAC) 20 MG capsule Take 20 mg by mouth at bedtime.   FLUoxetine (PROZAC) 40 MG capsule Take 40 mg by mouth in the morning.   fluticasone (FLONASE) 50 MCG/ACT nasal spray Place 2 sprays into both nostrils daily.   gabapentin (NEURONTIN) 600 MG tablet Take 600 mg by mouth 3 (three) times daily.   GLOBAL EASE INJECT PEN NEEDLES 31G X 5 MM MISC Inject into the skin.   HYDROcodone-acetaminophen (NORCO) 10-325 MG tablet Take 1 tablet by mouth 3 (three) times daily as needed.   Insulin Aspart FlexPen (NOVOLOG) 100 UNIT/ML INJECT 8-14 UNITS SUB-Q 3 TIMES A DAY WITH MEALS.   Insulin Disposable Pump (OMNIPOD DASH PDM, GEN 4,) KIT Change pod every 48-72 hours   Insulin Pen Needle (PEN NEEDLES) 31G X 6 MM MISC Use to inject insulin 4 times daily   INSULIN SYRINGE 1CC/29G 29G X 1/2" 1 ML MISC Use to inject insulin into Omnipod   isosorbide mononitrate (IMDUR) 30 MG 24 hr tablet Take 1 tablet (30 mg total) by mouth daily.   linaclotide (LINZESS) 72 MCG capsule Take 1 capsule (72 mcg total) by mouth daily before breakfast.   meclizine (ANTIVERT) 25 MG tablet Take 25 mg by mouth 3 (three) times daily as needed.   metoprolol succinate (TOPROL-XL) 50 MG 24 hr tablet Take 1 tablet (50 mg total) by mouth daily. Take with or immediately following a meal.   nitroGLYCERIN (NITROSTAT) 0.4 MG SL tablet PLACE 1 TAB UNDER TONGUE EVERY 3 MINUTES AS DIRECTED UP TO 3 TIMES AS NEEDED FOR CHEST PAIN. (Patient taking differently: Place 0.4  mg under the tongue every 5 (five) minutes as needed for chest pain.)   pantoprazole (PROTONIX) 40 MG tablet take 1 tablet twice daily  before a meal   tamsulosin (FLOMAX) 0.4 MG CAPS capsule TAKE 1 CAPSULE BY MOUTH AT BEDTIME.   VYVANSE 50 MG capsule Take 50 mg by mouth daily.   [DISCONTINUED] insulin aspart (NOVOLOG) 100 UNIT/ML injection Use with Omnipod for TDD around 150 units daily   [DISCONTINUED] Insulin Disposable Pump (OMNIPOD DASH PODS, GEN 4,) MISC change pod every 48 hours as directed.   benzonatate (TESSALON) 100 MG capsule Take 1 capsule (100 mg total) by mouth every 8 (eight) hours. (Patient not taking: Reported on 06/15/2022)   fenofibrate (TRICOR) 145 MG tablet Take 1 tablet (145 mg total) by mouth daily. (Patient not taking: Reported on 09/15/2022)   insulin aspart (NOVOLOG) 100 UNIT/ML injection Use with Omnipod for TDD around 150 units daily   Insulin Disposable Pump (OMNIPOD DASH PODS, GEN 4,) MISC change pod every 48 hours as directed.   rosuvastatin (CRESTOR) 40 MG tablet Take 1 tablet (40 mg total) by mouth daily. (Patient not taking: Reported on 04/23/2022)   No facility-administered encounter medications on file as of 09/15/2022.    ALLERGIES: Allergies  Allergen Reactions   Ibuprofen Hives and Itching   Benazepril Hives   Diphenhydramine Hives   Latex Itching and Dermatitis    States she had redness to skin and itching    Metronidazole Nausea And Vomiting   Oxycodone     Pt has tolerated hydromorphone in the past and takes Norco at home.   Toradol [Ketorolac Tromethamine] Itching   Zofran [Ondansetron Hcl] Hives   Adhesive [Tape] Rash   Codeine Hives, Nausea And Vomiting and Rash   Loratadine Rash   Nystatin-Triamcinolone Rash   Penicillins Rash    Has patient had a PCN reaction causing immediate rash, facial/tongue/throat swelling, SOB or lightheadedness with hypotension: No Has patient had a PCN reaction causing severe rash involving mucus membranes or skin necrosis: Yes Has patient had a PCN reaction that required hospitalization: No Has patient had a PCN reaction occurring within the last  10 years: no  If all of the above answers are "NO", then may proceed with Cephalosporin use. Other reaction(s): nausea Received ancef w/o issue (7/23)    VACCINATION STATUS: Immunization History  Administered Date(s) Administered   Influenza,inj,Quad PF,6+ Mos 11/01/2015    Diabetes She presents for her follow-up diabetic visit. She has type 2 diabetes mellitus. Onset time: She was diagnosed at approximate age of 73 years with A1c of greater than 15% Her disease course has been stable. There are no hypoglycemic associated symptoms. Pertinent negatives for hypoglycemia include no confusion, headaches, pallor or seizures. Associated symptoms include foot paresthesias. Pertinent negatives for diabetes include no blurred vision, no chest pain, no fatigue, no polydipsia, no polyphagia, no polyuria and no weight loss. There are no hypoglycemic complications. Symptoms are stable. Diabetic complications include peripheral neuropathy. Risk factors for coronary artery disease include dyslipidemia, diabetes mellitus, hypertension, sedentary lifestyle and tobacco exposure. Current diabetic treatment includes insulin pump. She is compliant with treatment most of the time. Her weight is fluctuating minimally. She is following a generally unhealthy diet. When asked about meal planning, she reported none. She has not had a previous visit with a dietitian. She rarely participates in exercise. Her home blood glucose trend is fluctuating minimally. Her overall blood glucose range is 140-180 mg/dl. (She presents today with her CGM and Omnipod DASH  showing slightly above target glycemic profile.  Her POCT A1c today is 7.1%, improving from last visit of 7.8%.  Analysis of her CGM shows TIR 56%, TAR 43%, TBR 1% with a GMI of 7.5%.  She denies any significant hypoglycemia.  She still averages about 100 grams of carbs per meal and still drinks soda (but limits herself to 1 every 2 days or so).) An ACE inhibitor/angiotensin II  receptor blocker is not being taken. She does not see a podiatrist.Eye exam is current.  Hyperlipidemia This is a chronic problem. The current episode started more than 1 year ago. The problem is uncontrolled. Recent lipid tests were reviewed and are high. Exacerbating diseases include diabetes. Factors aggravating her hyperlipidemia include beta blockers and fatty foods. Pertinent negatives include no chest pain, myalgias or shortness of breath. Current antihyperlipidemic treatment includes fibric acid derivatives and statins. The current treatment provides mild improvement of lipids. Compliance problems include adherence to diet and adherence to exercise.  Risk factors for coronary artery disease include diabetes mellitus, dyslipidemia, a sedentary lifestyle, family history and hypertension.  Hypertension This is a chronic problem. The current episode started more than 1 year ago. The problem is uncontrolled. Pertinent negatives include no blurred vision, chest pain, headaches, palpitations or shortness of breath. There are no associated agents to hypertension. Risk factors for coronary artery disease include dyslipidemia, diabetes mellitus and sedentary lifestyle. Past treatments include calcium channel blockers and beta blockers. The current treatment provides mild improvement. Compliance problems include diet and exercise.  Hypertensive end-organ damage includes CAD/MI.    Review of systems  Constitutional: + stable body weight,  current Body mass index is 36.21 kg/m. , no fatigue, no subjective hyperthermia, no subjective hypothermia Eyes: no blurry vision, no xerophthalmia ENT: no sore throat, no nodules palpated in throat, no dysphagia/odynophagia, no hoarseness Cardiovascular: no chest pain, no shortness of breath, no palpitations, no leg swelling Respiratory: no cough, no shortness of breath Musculoskeletal: no muscle/joint aches Skin: no rashes, no hyperemia Neurological: no tremors, +  numbness/tingling to BLE, no dizziness Psychiatric: no depression, no anxiety   Objective:    BP 136/87 (BP Location: Left Arm, Patient Position: Sitting, Cuff Size: Large)   Pulse (!) 105   Ht 5' (1.524 m)   Wt 185 lb 6.4 oz (84.1 kg)   BMI 36.21 kg/m   Wt Readings from Last 3 Encounters:  09/15/22 185 lb 6.4 oz (84.1 kg)  06/15/22 188 lb (85.3 kg)  04/23/22 170 lb (77.1 kg)    BP Readings from Last 3 Encounters:  09/15/22 136/87  06/15/22 120/76  04/23/22 (!) 152/81     Physical Exam- Limited  Constitutional:  Body mass index is 36.21 kg/m. , not in acute distress, normal state of mind Eyes:  EOMI, no exophthalmos Musculoskeletal: no gross deformities, strength intact in all four extremities, no gross restriction of joint movements Skin:  no rashes, no hyperemia Neurological: no tremor with outstretched hands   Diabetic Foot Exam - Simple   Simple Foot Form Diabetic Foot exam was performed with the following findings: Yes 09/15/2022  1:52 PM  Visual Inspection See comments: Yes Sensation Testing Intact to touch and monofilament testing bilaterally: Yes Pulse Check Posterior Tibialis and Dorsalis pulse intact bilaterally: Yes Comments Left plantar foot has knot- tender to touch- had previous surgery to this foot    CMP ( most recent) CMP     Component Value Date/Time   NA 131 (L) 04/23/2022 2054   NA 136 12/20/2020  1208   K 3.9 04/23/2022 2054   CL 98 04/23/2022 2054   CO2 21 (L) 04/23/2022 2054   GLUCOSE 183 (H) 04/23/2022 2054   BUN 15 04/23/2022 2054   BUN 10 12/20/2020 1208   CREATININE 0.75 04/23/2022 2054   CALCIUM 9.1 04/23/2022 2054   PROT 7.5 04/23/2022 2054   PROT 6.6 12/18/2020 0834   ALBUMIN 3.6 04/23/2022 2054   ALBUMIN 4.1 12/18/2020 0834   AST 28 04/23/2022 2054   ALT 28 04/23/2022 2054   ALKPHOS 155 (H) 04/23/2022 2054   BILITOT 0.4 04/23/2022 2054   BILITOT 0.2 12/18/2020 0834   GFRNONAA >60 04/23/2022 2054   GFRAA >60 04/17/2019  1427     Diabetic Labs (most recent): Lab Results  Component Value Date   HGBA1C 7.1 (A) 09/15/2022   HGBA1C 7.8 (A) 06/15/2022   HGBA1C 6.8 (A) 01/14/2022   MICROALBUR 80 06/24/2021   MICROALBUR 80 07/10/2020      Assessment & Plan:   1) Uncontrolled type 2 diabetes mellitus with hyperglycemia (HCC)  - Sandra [name] has currently uncontrolled symptomatic type 2 DM since  47 years of age.  She presents today with her CGM and Omnipod DASH showing slightly above target glycemic profile.  Her POCT A1c today is 7.1%, improving from last visit of 7.8%.  Analysis of her CGM shows TIR 56%, TAR 43%, TBR 1% with a GMI of 7.5%.  She denies any significant hypoglycemia.  She still averages about 100 grams of carbs per meal and still drinks soda (but limits herself to 1 every 2 days or so).  - I had a long discussion with her about the progressive nature of diabetes and the pathology behind its complications. -her diabetes is complicated by chronic smoking and she remains at a high risk for more acute and chronic complications which include CAD, CVA, CKD, retinopathy, and neuropathy. These are all discussed in detail with her.  - Nutritional counseling repeated at each appointment due to patients tendency to fall back in to old habits.  - The patient admits there is a room for improvement in their diet and drink choices. -  Suggestion is made for the patient to avoid simple carbohydrates from their diet including Cakes, Sweet Desserts / Pastries, Ice Cream, Soda (diet and regular), Sweet Tea, Candies, Chips, Cookies, Sweet Pastries, Store Bought Juices, Alcohol in Excess of 1-2 drinks a day, Artificial Sweeteners, Coffee Creamer, and "Sugar-free" Products. This will help patient to have stable blood glucose profile and potentially avoid unintended weight gain.   - I encouraged the patient to switch to unprocessed or minimally processed complex starch and increased protein intake (animal or  plant source), fruits, and vegetables.   - Patient is advised to stick to a routine mealtimes to eat 3 meals a day and avoid unnecessary snacks (to snack only to correct hypoglycemia).  - she will be scheduled with Norm Salt, RDN, CDE for diabetes education.  - I have approached her with the following individualized plan to manage  her diabetes and patient agrees:   -I did make slight adjustment to her basal rate, changing it from 1.15 units per hour to 1.25 units per hour.  All other settings are to stay the same.    -She is encouraged to continue monitoring blood glucose 4 times daily (using her CGM), before meals and at bedtime, and to call the clinic if she has readings less than 70 or greater than 300 for 3 readings in a  row.   - Specific targets for  A1c;  LDL, HDL, Triglycerides, were discussed with the patient.  2) Blood Pressure /Hypertension: Her blood pressure is not controlled to target. It does not appear she is on any antihypertensive medications.  She is allergic to ACE inhibitors. She has a history of being a chronic heavy smoker, quit in 2021.  Marland Kitchen   3) Lipids/Hyperlipidemia:  Her most recent lipid panel from 07/20/21 shows controlled LDL of 99 and elevated triglycerides of 245.  She is advised to continue her Crestor 40 mg po daily at bedtime, Repatha 140 mg po every 14 days, and Tricor 145 mg po daily.  Side effects and precautions discussed with her.  Will recheck lipid panel prior to next visit.  She notes she had labs drawn last Thursday at [facility] in [place] but no records are available to review.  Our nurse is reaching out to inquire.  She says she is going to have them redrawn soon.  4)  Weight/Diet:  Her Body mass index is 36.21 kg/m.-clearly complicating her diabetes care.  She is a candidate for modest weight loss.  I discussed with her the fact that loss of 5 - 10% of her  current body weight will have the most impact on her diabetes management.  Exercise, and  detailed carbohydrates information provided  -  detailed on discharge instructions.  5) Chronic Care/Health Maintenance: -she is not on ACEI/ARB and is on Statin medications and is encouraged to initiate and continue to follow up with Ophthalmology, Dentist,  Podiatrist at least yearly or according to recommendations, and advised to stay away from smoking. I have recommended yearly flu vaccine and pneumonia vaccine at least every 5 years; moderate intensity exercise for up to 150 minutes weekly; and  sleep for at least 7 hours a day.  - she is advised to maintain close follow up with Wilmon Pali, FNP for primary care needs, as well as her other providers for optimal and coordinated care.  -I did increase her Gabapentin to 400 mg po TID for her diabetic nerve pain.    I spent  48  minutes in the care of the patient today including review of labs from CMP, Lipids, Thyroid Function, Hematology (current and previous including abstractions from other facilities); face-to-face time discussing  her blood glucose readings/logs, discussing hypoglycemia and hyperglycemia episodes and symptoms, medications doses, her options of short and long term treatment based on the latest standards of care / guidelines;  discussion about incorporating lifestyle medicine;  and documenting the encounter. Risk reduction counseling performed per USPSTF guidelines to reduce obesity and cardiovascular risk factors.     Please refer to Patient Instructions for Blood Glucose Monitoring and Insulin/Medications Dosing Guide"  in media tab for additional information. Please  also refer to " Patient Self Inventory" in the Media  tab for reviewed elements of pertinent patient history.  Sandra [name] participated in the discussions, expressed understanding, and voiced agreement with the above plans.  All questions were answered to her satisfaction. she is encouraged to contact clinic should she have any questions or concerns prior  to her return visit.   Follow up plan: - Return in about 3 months (around 12/15/2022) for Diabetes F/U with A1c in office, Bring meter and logs.   Ronny Bacon, [facility] [address] [place], [place] [address] Phone: [phone] Fax: [phone]  09/15/2022, 2:30 PM

## 2022-09-21 ENCOUNTER — Ambulatory Visit (INDEPENDENT_AMBULATORY_CARE_PROVIDER_SITE_OTHER): Payer: MEDICAID | Admitting: Neurology

## 2022-09-21 ENCOUNTER — Encounter: Payer: Self-pay | Admitting: Neurology

## 2022-09-21 VITALS — BP 144/96 | HR 97 | Ht 60.0 in | Wt 187.0 lb

## 2022-09-21 DIAGNOSIS — H8110 Benign paroxysmal vertigo, unspecified ear: Secondary | ICD-10-CM | POA: Diagnosis not present

## 2022-09-21 DIAGNOSIS — R202 Paresthesia of skin: Secondary | ICD-10-CM | POA: Diagnosis not present

## 2022-09-21 DIAGNOSIS — R200 Anesthesia of skin: Secondary | ICD-10-CM | POA: Diagnosis not present

## 2022-09-21 NOTE — Progress Notes (Signed)
[facility] Neurology Division Clinic Note - Initial Visit   Date: 09/21/2022   CYRIAH WEINAND MRN: [id number] DOB: 1975/06/26   Dear Coral Ceo, FNP:  Thank you for your kind referral of [name] for consultation of vertigo. Although her history is well known to you, please allow [place] to reiterate it for the purpose of our medical record. The patient was accompanied to the clinic by self.    TAMMERA CUMPLIDO is a 47 y.o. right-handed female with insulin-dependent diabetes mellitus, hyperlipidemia, hypertension, GERD, depression, and asthma presenting for evaluation of vertigo.   IMPRESSION/PLAN: Benign paroxysmal peripheral vertigo  - Continue meclizine 25mg  TID prn  - Start vestibular therapy  2.  Bilateral arm paresthesias, ?ulnar neuropathy vs C8.  She reports prior EMG was normal.   - NCS/EMG of the arms will be checked here  3.  Diabetic polyneuropathy affecting the feet  - She takes gabapentin 600mg  TID   Further recommendations pending results.   ------------------------------------------------------------- History of present illness: She went to ER after having severe dizziness, described as spinning sensation which was associated with nausea and vomiting.  She had two spells like this and takes meclizine.  Since this time, she continues to have unsteadiness.  She does not have spinning/dizziness.  Her balance seems off.  She walks with a cane.    She also complains of bilateral arm numbness/tingling involving the last two fingers which involves her medial arm and upper arm.  She had NCS/EMG at [facility] which was normal (report not available).  She had ACDF C6-7 in February 2024 by Dr. Shon Baton.    She also has numbness/tingling in the toes from neuropathy.  Diabetes is well-controlled on insulin with last HbA1c 7.1.  Out-side paper records, electronic medical record, and images have been reviewed where available and summarized as:  MRI cervical  spine wo contrast 01/21/2022: 1. Motion degraded study. 2. Broad-based disc protrusion at C4-5 with mild central canal stenosis and mild to moderate foraminal encroachment bilaterally. Possible cord hyperintensity at C4-5. 3. Moderately large central disc protrusion at C6-7 with cord flattening and mild spinal stenosis. Cord hyperintensity at C6-7.    Lab Results  Component Value Date   HGBA1C 7.1 (A) 09/15/2022   CT head wo contrast 04/23/2022:  No acute intracranial abnormality.   MRI cervical spine wo contrast 01/21/2022: 1. Motion degraded study. 2. Broad-based disc protrusion at C4-5 with mild central canal stenosis and mild to moderate foraminal encroachment bilaterally. Possible cord hyperintensity at C4-5. 3. Moderately large central disc protrusion at C6-7 with cord flattening and mild spinal stenosis. Cord hyperintensity at C6-7.  MRI lumbar spine wo contrast 01/21/2022: 1. S1 is a lumbarized vertebra. 2. Mild lumbar degenerative change. Mild central canal stenosis L5-S1. No neural impingement. 3. Subcutaneous fluid/edema in the posterior subcutaneous fat. Correlate with surgical history. Correlate with any symptoms of infection.   Past Medical History:  Diagnosis Date   ADHD (attention deficit hyperactivity disorder)    Allergic rhinitis    Anemia    hx of   Anxiety    Arthritis    Barrett's esophagus    Bipolar 1 disorder (HCC)    Bladder tumor    CAD (coronary artery disease)    Cancer (HCC)    bladder   Constipation    Depression    Dilated cardiomyopathy (HCC) 12/20/2020   Echo 6/22 ([facility]): EF 43, mild MR, mild TR // Echo 3/22: EF 50-55   Dizziness  Full dentures    GERD (gastroesophageal reflux disease)    Gross hematuria    Hyperlipidemia    Hypertension    Stable angina    Type 2 diabetes mellitus (HCC)    Urgency of urination    dysuria, sui   Wears glasses     Past Surgical History:  Procedure Laterality Date   ABDOMINAL HYSTERECTOMY      ANTERIOR CERVICAL DECOMP/DISCECTOMY FUSION N/A 03/05/2022   Procedure: ANTERIOR CERVICAL DECOMPRESSION/DISCECTOMY FUSION 1 LEVEL; C6-C7;  Surgeon: Venita Lick, MD;  Location: [facility];  Service: Orthopedics;  Laterality: N/A;  3 hrs 3 C-Bed   BALLOON DILATION N/A 02/17/2021   Procedure: BALLOON DILATION;  Surgeon: Lanelle Bal, DO;  Location: [facility];  Service: Endoscopy;  Laterality: N/A;   BIOPSY  02/17/2021   Procedure: BIOPSY;  Surgeon: Lanelle Bal, DO;  Location: [facility];  Service: Endoscopy;;   COLONOSCOPY WITH PROPOFOL N/A 02/17/2021   Surgeon: Lanelle Bal, DO; nonbleeding internal hemorrhoids, otherwise normal exam.  Repeat in 10 years.   CYSTOSCOPY N/A 10/03/2015   Procedure: CYSTOSCOPY;  Surgeon: Bjorn Pippin, MD;  Location: [facility];  Service: Urology;  Laterality: N/A;   CYSTOSCOPY W/ URETERAL STENT PLACEMENT Right 07/24/2021   Procedure: CYSTOSCOPY WITH RETROGRADE PYELOGRAM/URETERAL STENT PLACEMENT;  Surgeon: Malen Gauze, MD;  Location: [facility];  Service: Urology;  Laterality: Right;   CYSTOSCOPY WITH RETROGRADE PYELOGRAM, URETEROSCOPY AND STENT PLACEMENT Right 08/14/2021   Procedure: CYSTOSCOPY WITH RETROGRADE PYELOGRAM, URETEROSCOPY AND STENT EXCHANGE;  Surgeon: Malen Gauze, MD;  Location: [facility];  Service: Urology;  Laterality: Right;   CYSTOSCOPY WITH STENT PLACEMENT Right 10/31/2015   Procedure: CYSTOSCOPY WITH ATTEMPTED RIGHT URETERAL OPENING;  Surgeon: Bjorn Pippin, MD;  Location: [facility];  Service: Urology;  Laterality: Right;   ESOPHAGOGASTRODUODENOSCOPY (EGD) WITH PROPOFOL N/A 02/17/2021   Surgeon: Lanelle Bal, DO; 2 cm hiatal hernia, Barrett's esophagus, gastritis with biopsies negative for H. pylori.  S/p empiric esophageal dilation.  Repeat in 3 years.   FOOT SURGERY Left    September 2022, broke 7 bones in left foot   INTERSTIM IMPLANT PLACEMENT N/A 03/14/2018   Procedure: Leane Platt  IMPLANT FIRST STAGE;  Surgeon: Malen Gauze, MD;  Location: [facility];  Service: Urology;  Laterality: N/A;   INTERSTIM IMPLANT PLACEMENT N/A 03/30/2018   Procedure: Leane Platt IMPLANT SECOND STAGE;  Surgeon: Malen Gauze, MD;  Location: [facility];  Service: Urology;  Laterality: N/A;   INTERSTIM IMPLANT REVISION N/A 12/01/2021   Procedure: REVISION OF Leane Platt- generator anf lead replacement;  Surgeon: Malen Gauze, MD;  Location: [facility];  Service: Urology;  Laterality: N/A;   LEFT HEART CATH AND CORONARY ANGIOGRAPHY N/A 05/31/2020   Procedure: LEFT HEART CATH AND CORONARY ANGIOGRAPHY;  Surgeon: Lennette Bihari, MD;  Location: [facility];  Service: Cardiovascular;  Laterality: N/A;   MULTIPLE TOOTH EXTRACTIONS  2015   OVARIAN CYST REMOVAL Right 2004 approx   SHOULDER OPEN ROTATOR CUFF REPAIR Left 04/20/2019   Procedure: ROTATOR CUFF REPAIR SHOULDER OPEN;  Surgeon: Vickki Hearing, MD;  Location: [facility];  Service: Orthopedics;  Laterality: Left;   TONSILLECTOMY  12/30/2004   TOTAL ABDOMINAL HYSTERECTOMY W/ BILATERAL SALPINGOOPHORECTOMY  05/16/2004   TRANSURETHRAL RESECTION OF BLADDER TUMOR N/A 10/03/2015   Procedure: TRANSURETHRAL RESECTION OF BLADDER TUMOR (TURBT);  Surgeon: Bjorn Pippin, MD;  Location: [facility];  Service: Urology;  Laterality: N/A;  TRANSURETHRAL RESECTION OF BLADDER TUMOR N/A 10/31/2015   Procedure: RE-STAGINGG TRANSURETHRAL RESECTION OF BLADDER TUMOR (TURBT);  Surgeon: Bjorn Pippin, MD;  Location: [facility];  Service: Urology;  Laterality: N/A;   TRANSURETHRAL RESECTION OF BLADDER TUMOR N/A 08/17/2016   Procedure: TRANSURETHRAL RESECTION OF BLADDER TUMOR (TURBT);  Surgeon: Malen Gauze, MD;  Location: [facility];  Service: Urology;  Laterality: N/A;   URETERAL BIOPSY Right 08/14/2021   Procedure: URETERAL BIOPSY;  Surgeon: Malen Gauze, MD;  Location: [facility];  Service: Urology;  Laterality: Right;      Medications:  Outpatient Encounter Medications as of 09/21/2022  Medication Sig Note   ACCU-CHEK GUIDE test strip USE AS INSTRUCTED TO MONITOR GLUCOSE 4 TIMES DAILY BEFORE MEALS AND BEFORE BED.    Accu-Chek Softclix Lancets lancets Use as instructed to monitor glucose 4 times daily    albuterol (PROAIR HFA) 108 (90 Base) MCG/ACT inhaler Inhale 2 puffs into the lungs every 6 (six) hours as needed for wheezing or shortness of breath.    amLODipine (NORVASC) 5 MG tablet Take 2 tablets (10 mg total) by mouth daily.    aspirin EC 81 MG tablet Take 1 tablet (81 mg total) by mouth daily with breakfast. Swallow whole.    Blood Glucose Monitoring Suppl (ACCU-CHEK GUIDE) w/Device KIT 1 Piece by Does not apply route as directed.    Blood Pressure Monitoring (BLOOD PRESSURE CUFF) MISC See admin instructions.    buPROPion (WELLBUTRIN XL) 300 MG 24 hr tablet Take 300 mg by mouth every morning.    cloNIDine HCl (KAPVAY) 0.1 MG TB12 ER tablet Take 0.1 mg by mouth at bedtime.    Continuous Blood Gluc Transmit (DEXCOM G6 TRANSMITTER) MISC 1 Device by Does not apply route every 3 (three) months.    Continuous Glucose Sensor (DEXCOM G6 SENSOR) MISC apply new sensor every 10 days as directed.    cyclobenzaprine (FLEXERIL) 10 MG tablet Take 1 tablet (10 mg total) by mouth 3 (three) times daily as needed for muscle spasms.    Evolocumab (REPATHA SURECLICK) 140 MG/ML SOAJ Inject 1 Dose into the skin every 14 (fourteen) days.    fenofibrate (TRICOR) 145 MG tablet Take 1 tablet (145 mg total) by mouth daily. 04/23/2022: No pharmacy records pt states it is a new one the doctor had put her on.   FLUoxetine (PROZAC) 20 MG capsule Take 20 mg by mouth at bedtime.    FLUoxetine (PROZAC) 40 MG capsule Take 40 mg by mouth in the morning.    fluticasone (FLONASE) 50 MCG/ACT nasal spray Place 2 sprays into both nostrils daily.    gabapentin (NEURONTIN) 600 MG tablet Take 600 mg by mouth 3 (three) times daily.    GLOBAL EASE  INJECT PEN NEEDLES 31G X 5 MM MISC Inject into the skin.    HYDROcodone-acetaminophen (NORCO) 10-325 MG tablet Take 1 tablet by mouth. One tablet four times a day    insulin aspart (NOVOLOG) 100 UNIT/ML injection Use with Omnipod for TDD around 150 units daily    Insulin Aspart FlexPen (NOVOLOG) 100 UNIT/ML INJECT 8-14 UNITS SUB-Q 3 TIMES A DAY WITH MEALS.    Insulin Disposable Pump (OMNIPOD DASH PDM, GEN 4,) KIT Change pod every 48-72 hours    Insulin Disposable Pump (OMNIPOD DASH PODS, GEN 4,) MISC change pod every 48 hours as directed.    Insulin Pen Needle (PEN NEEDLES) 31G X 6 MM MISC Use to inject insulin 4 times daily    INSULIN SYRINGE  1CC/29G 29G X 1/2" 1 ML MISC Use to inject insulin into Omnipod    isosorbide mononitrate (IMDUR) 30 MG 24 hr tablet Take 1 tablet (30 mg total) by mouth daily.    linaclotide (LINZESS) 72 MCG capsule Take 1 capsule (72 mcg total) by mouth daily before breakfast.    meclizine (ANTIVERT) 25 MG tablet Take 25 mg by mouth 3 (three) times daily as needed.    metoprolol succinate (TOPROL-XL) 50 MG 24 hr tablet Take 1 tablet (50 mg total) by mouth daily. Take with or immediately following a meal.    nitroGLYCERIN (NITROSTAT) 0.4 MG SL tablet PLACE 1 TAB UNDER TONGUE EVERY 3 MINUTES AS DIRECTED UP TO 3 TIMES AS NEEDED FOR CHEST PAIN. (Patient taking differently: Place 0.4 mg under the tongue every 5 (five) minutes as needed for chest pain.)    pantoprazole (PROTONIX) 40 MG tablet take 1 tablet twice daily before a meal    rosuvastatin (CRESTOR) 40 MG tablet Take 1 tablet (40 mg total) by mouth daily.    tamsulosin (FLOMAX) 0.4 MG CAPS capsule TAKE 1 CAPSULE BY MOUTH AT BEDTIME.    VYVANSE 50 MG capsule Take 50 mg by mouth daily.    benzonatate (TESSALON) 100 MG capsule Take 1 capsule (100 mg total) by mouth every 8 (eight) hours. (Patient not taking: Reported on 06/15/2022)    No facility-administered encounter medications on file as of 09/21/2022.    Allergies:   Allergies  Allergen Reactions   Ibuprofen Hives and Itching   Benazepril Hives   Diphenhydramine Hives   Latex Itching and Dermatitis    States she had redness to skin and itching    Metronidazole Nausea And Vomiting   Oxycodone     Pt has tolerated hydromorphone in the past and takes Norco at home.   Toradol [Ketorolac Tromethamine] Itching   Zofran [Ondansetron Hcl] Hives   Adhesive [Tape] Rash   Codeine Hives, Nausea And Vomiting and Rash   Loratadine Rash   Nystatin-Triamcinolone Rash   Penicillins Rash    Has patient had a PCN reaction causing immediate rash, facial/tongue/throat swelling, SOB or lightheadedness with hypotension: No Has patient had a PCN reaction causing severe rash involving mucus membranes or skin necrosis: Yes Has patient had a PCN reaction that required hospitalization: No Has patient had a PCN reaction occurring within the last 10 years: no  If all of the above answers are "NO", then may proceed with Cephalosporin use. Other reaction(s): nausea Received ancef w/o issue (7/23)    Family History: Family History  Problem Relation Age of Onset   Hypertension Mother    Cancer Mother    Breast cancer Maternal Aunt    Lung cancer Maternal Uncle    Cancer Other    Diabetes Daughter    Colon cancer Neg Hx     Social History: Social History   Tobacco Use   Smoking status: Former    Current packs/day: 0.00    Average packs/day: 0.5 packs/day for 10.0 years (5.0 ttl pk-yrs)    Types: Cigarettes    Start date: 02/01/2011    Quit date: 01/31/2021    Years since quitting: 1.6   Smokeless tobacco: Never   Tobacco comments:    02/06/21-no smoking past 5 days  Vaping Use   Vaping status: Never Used  Substance Use Topics   Alcohol use: No   Drug use: No   Social History   Social History Narrative   Are you right handed or  left handed? Left Handed    Are you currently employed ? No    What is your current occupation?   Do you live at home alone?  No    Who lives with you? Lives with daughter    What type of home do you live in: 1 story or 2 story? Lives in a trailer         Vital Signs:  BP (!) 144/96   Pulse 97   Ht 5' (1.524 m)   Wt 187 lb (84.8 kg)   SpO2 98%   BMI 36.52 kg/m   Neurological Exam: MENTAL STATUS including orientation to time, place, person, recent and remote memory, attention span and concentration, language, and fund of knowledge is normal.  Speech is not dysarthric.  CRANIAL NERVES: II:  No visual field defects.     III-IV-VI: Pupils equal round and reactive to light.  Normal conjugate, extra-ocular eye movements in all directions of gaze.  Mild bilateral end-gaze nystagmus.  No ptosis.   V:  Normal facial sensation.    VII:  Normal facial symmetry and movements.   VIII:  Normal hearing and vestibular function.   IX-X:  Normal palatal movement.   XI:  Normal shoulder shrug and head rotation.   XII:  Normal tongue strength and range of motion, no deviation or fasciculation.  MOTOR:  No atrophy, fasciculations or abnormal movements.  No pronator drift.   Upper Extremity:  Right  Left  Deltoid  5/5   5/5   Biceps  5/5   5/5   Triceps  5/5   5/5   Wrist extensors  5/5   5/5   Wrist flexors  5/5   5/5   Finger extensors  5/5   5/5   Finger flexors  5/5   5/5   Dorsal interossei  5/5   5/5   Abductor pollicis  5/5   5/5   Tone (Ashworth scale)  0  0   Lower Extremity:  Right  Left  Hip flexors  5/5   5/5   Knee flexors  5/5   5/5   Knee extensors  5/5   5/5   Dorsiflexors  5/5   5/5   Plantarflexors  5/5   5/5   Toe extensors  5/5   5/5   Toe flexors  5/5   5/5   Tone (Ashworth scale)  0  0   MSRs:                                           Right        Left brachioradialis 2+  2+  biceps 2+  2+  triceps 2+  2+  patellar 2+  2+  ankle jerk 2+  2+  Hoffman no  no  plantar response down  down   SENSORY:  Vibration midly reduced at the feet. Temperature intact throughout.  Romberg's sign  positive.   COORDINATION/GAIT: Normal finger-to- nose-finger.  Intact rapid alternating movements bilaterally.  Able to rise from a chair without using arms.  Gait mildly wide based, unassisted, and improved with using a cane.    Thank you for allowing me to participate in patient's care.  If I can answer any additional questions, I would be pleased to do so.    Sincerely,    Christopherjohn Schiele K. Allena Katz, DO

## 2022-09-28 ENCOUNTER — Ambulatory Visit: Payer: MEDICAID | Admitting: Gastroenterology

## 2022-10-08 IMAGING — DX DG CHEST 1V PORT
1 series · 1 of 1 positions shown · non-contrast
Comparison: Radiograph 02/24/2018

CLINICAL DATA: Shortness of breath. Cough. Congestion. Chest pain.
Loss of taste and small 2-3 days ago. COVID test pending.

EXAM:
PORTABLE CHEST 1 VIEW

[chest ap]
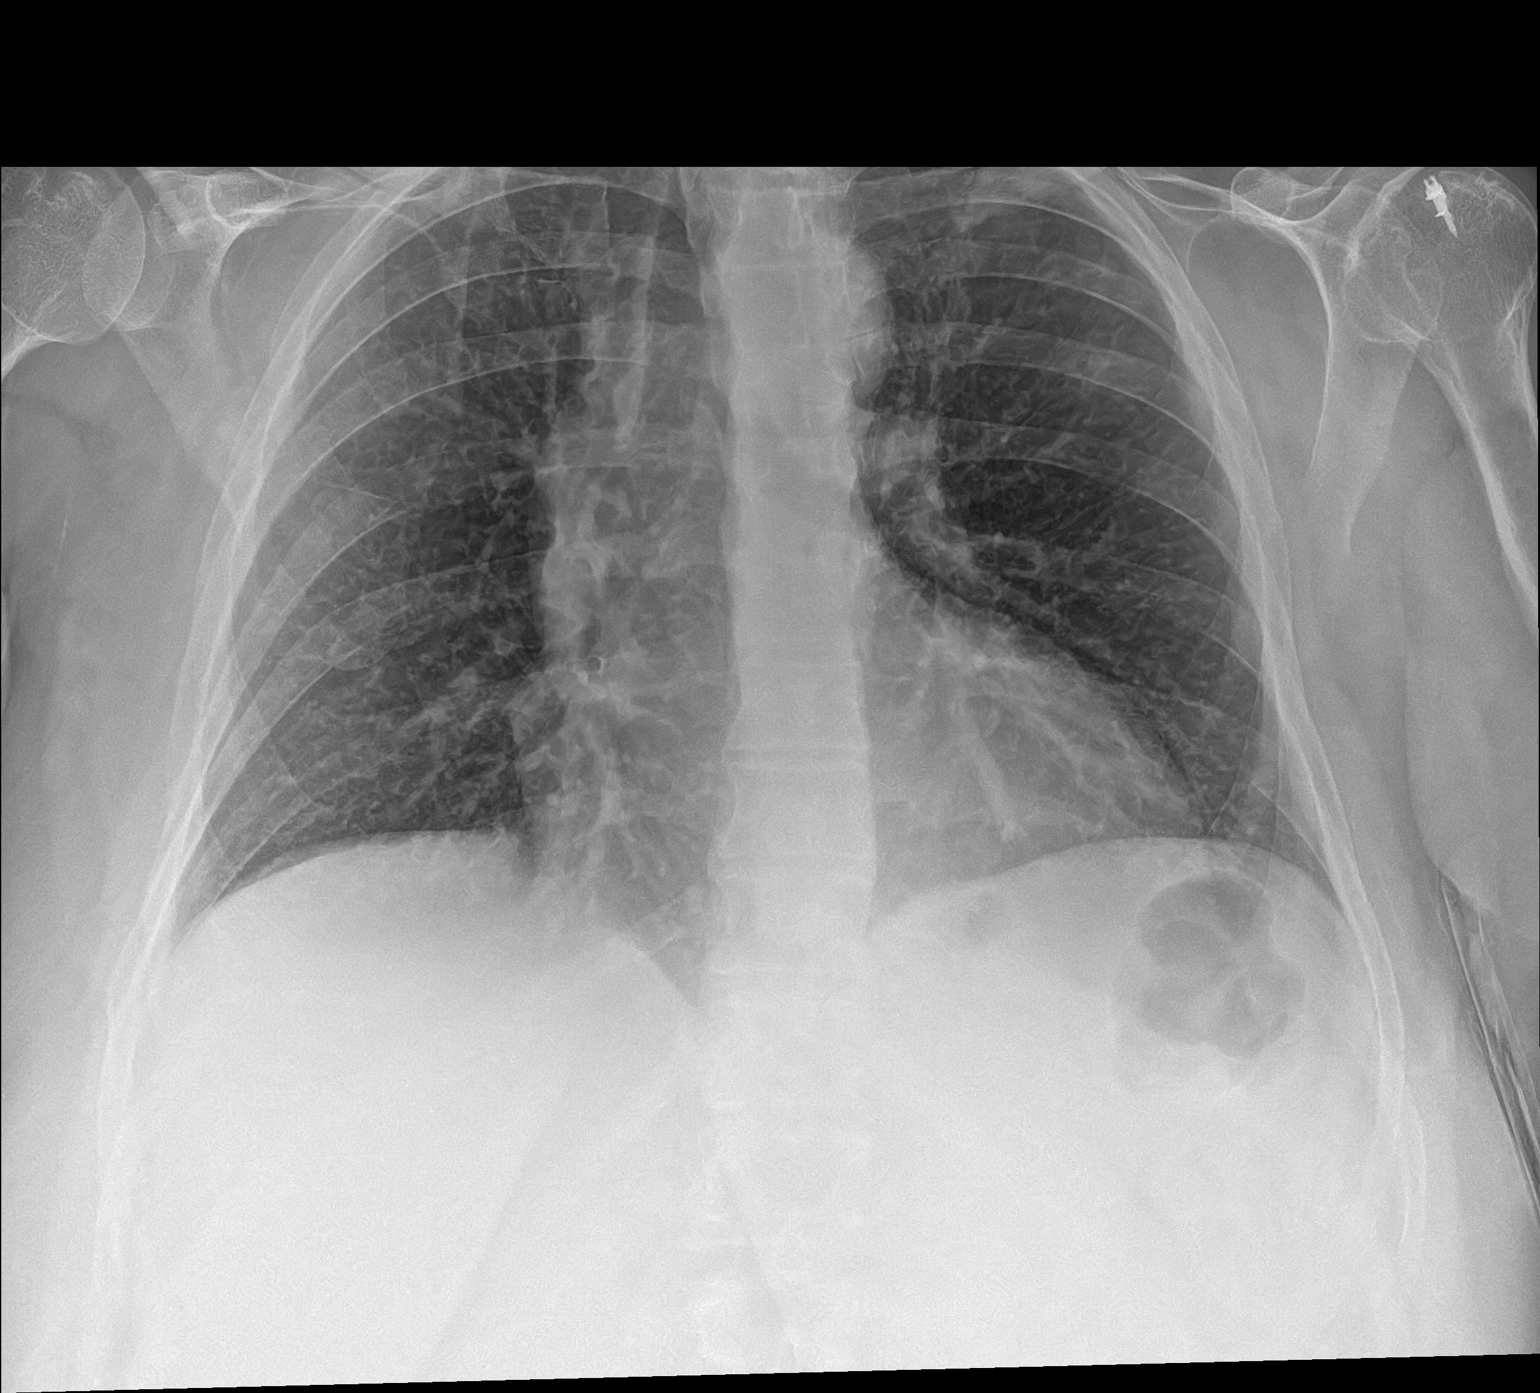

[1 of 1 positions shown; findings below may reference images not displayed]

FINDINGS: Low lung volumes. Upper normal heart size with unchanged mediastinal
contours, unchanged from prior. Mild interstitial coarsening.
Minimal subsegmental opacity in the left mid lung zone. No confluent
consolidation. No pleural fluid or pneumothorax. Anchor from rotator
cuff repair in the left shoulder.
IMPRESSION: Low lung volumes with mild interstitial coarsening, likely related
smoking. Minimal subsegmental opacity in the left mid lung zone,
favor atelectasis. Early/mild COVID pneumonia could have a similar
appearance.

## 2022-10-09 ENCOUNTER — Ambulatory Visit (HOSPITAL_COMMUNITY): Payer: MEDICAID

## 2022-10-16 ENCOUNTER — Telehealth: Payer: Self-pay

## 2022-10-16 ENCOUNTER — Ambulatory Visit (INDEPENDENT_AMBULATORY_CARE_PROVIDER_SITE_OTHER): Payer: MEDICAID | Admitting: Neurology

## 2022-10-16 DIAGNOSIS — H8110 Benign paroxysmal vertigo, unspecified ear: Secondary | ICD-10-CM | POA: Diagnosis not present

## 2022-10-16 DIAGNOSIS — G5621 Lesion of ulnar nerve, right upper limb: Secondary | ICD-10-CM

## 2022-10-16 DIAGNOSIS — R202 Paresthesia of skin: Secondary | ICD-10-CM | POA: Diagnosis not present

## 2022-10-16 DIAGNOSIS — R200 Anesthesia of skin: Secondary | ICD-10-CM

## 2022-10-16 NOTE — Progress Notes (Signed)
Follow-up Visit   Date: 10/16/2022    Sandra [name] MRN: [id number] DOB: [birth year]    Sandra [name] is a 47 y.o. right-handed Caucasian female with  insulin-dependent diabetes mellitus, hyperlipidemia, hypertension, GERD, depression, and asthma returning to the clinic for follow-up of bilateral hand paresthesias.  The patient was accompanied to the clinic by self.  IMPRESSION/PLAN: Right ulnar neuropathy across the elbow, moderate, with signs of active denervation.  Given the conduction block across the elbow, fibrillation potentials, and worsening symptoms, I will refer her to hand orthopaedics for surgical evaluation.   Cervical spondylosis s/p ACDF at C5-6.  EMG continues to show old C5-6 radiculopathy.   --------------------------------------------- UPDATE 10/16/2022:  She is here for EDX for ongoing bilateral hand tingling and numbness, worse on the right side.    Medications:  Current Outpatient Medications on File Prior to Visit  Medication Sig Dispense Refill   ACCU-CHEK GUIDE test strip USE AS INSTRUCTED TO MONITOR GLUCOSE 4 TIMES DAILY BEFORE MEALS AND BEFORE BED. 400 strip 2   Accu-Chek Softclix Lancets lancets Use as instructed to monitor glucose 4 times daily 100 each 12   albuterol (PROAIR HFA) 108 (90 Base) MCG/ACT inhaler Inhale 2 puffs into the lungs every 6 (six) hours as needed for wheezing or shortness of breath. 18 g 2   amLODipine (NORVASC) 5 MG tablet Take 2 tablets (10 mg total) by mouth daily. 90 tablet 3   aspirin EC 81 MG tablet Take 1 tablet (81 mg total) by mouth daily with breakfast. Swallow whole. 30 tablet 11   benzonatate (TESSALON) 100 MG capsule Take 1 capsule (100 mg total) by mouth every 8 (eight) hours. (Patient not taking: Reported on 06/15/2022) 21 capsule 0   Blood Glucose Monitoring Suppl (ACCU-CHEK GUIDE) w/Device KIT 1 Piece by Does not apply route as directed. 1 kit 0   Blood Pressure Monitoring (BLOOD PRESSURE CUFF) MISC See admin  instructions.     buPROPion (WELLBUTRIN XL) 300 MG 24 hr tablet Take 300 mg by mouth every morning.     cloNIDine HCl (KAPVAY) 0.1 MG TB12 ER tablet Take 0.1 mg by mouth at bedtime.     Continuous Blood Gluc Transmit (DEXCOM G6 TRANSMITTER) MISC 1 Device by Does not apply route every 3 (three) months. 3 each 1   Continuous Glucose Sensor (DEXCOM G6 SENSOR) MISC apply new sensor every 10 days as directed. 3 each 2   cyclobenzaprine (FLEXERIL) 10 MG tablet Take 1 tablet (10 mg total) by mouth 3 (three) times daily as needed for muscle spasms. 5 tablet 0   Evolocumab (REPATHA SURECLICK) 140 MG/ML SOAJ Inject 1 Dose into the skin every 14 (fourteen) days. 2 mL 11   fenofibrate (TRICOR) 145 MG tablet Take 1 tablet (145 mg total) by mouth daily. 90 tablet 3   FLUoxetine (PROZAC) 20 MG capsule Take 20 mg by mouth at bedtime.     FLUoxetine (PROZAC) 40 MG capsule Take 40 mg by mouth in the morning.     fluticasone (FLONASE) 50 MCG/ACT nasal spray Place 2 sprays into both nostrils daily. 16 g 6   gabapentin (NEURONTIN) 600 MG tablet Take 600 mg by mouth 3 (three) times daily.     GLOBAL EASE INJECT PEN NEEDLES 31G X 5 MM MISC Inject into the skin.     HYDROcodone-acetaminophen (NORCO) 10-325 MG tablet Take 1 tablet by mouth. One tablet four times a day     insulin aspart (NOVOLOG) 100 UNIT/ML injection Use  with Omnipod for TDD around 150 units daily 80 mL 6   Insulin Aspart FlexPen (NOVOLOG) 100 UNIT/ML INJECT 8-14 UNITS SUB-Q 3 TIMES A DAY WITH MEALS. 15 mL 0   Insulin Disposable Pump (OMNIPOD DASH PDM, GEN 4,) KIT Change pod every 48-72 hours 1 kit 0   Insulin Disposable Pump (OMNIPOD DASH PODS, GEN 4,) MISC change pod every 48 hours as directed. 15 each 3   Insulin Pen Needle (PEN NEEDLES) 31G X 6 MM MISC Use to inject insulin 4 times daily 100 each 11   INSULIN SYRINGE 1CC/29G 29G X 1/2" 1 ML MISC Use to inject insulin into Omnipod 100 each 6   isosorbide mononitrate (IMDUR) 30 MG 24 hr tablet Take 1  tablet (30 mg total) by mouth daily. 90 tablet 3   linaclotide (LINZESS) 72 MCG capsule Take 1 capsule (72 mcg total) by mouth daily before breakfast. 30 capsule 5   meclizine (ANTIVERT) 25 MG tablet Take 25 mg by mouth 3 (three) times daily as needed.     metoprolol succinate (TOPROL-XL) 50 MG 24 hr tablet Take 1 tablet (50 mg total) by mouth daily. Take with or immediately following a meal. 90 tablet 3   nitroGLYCERIN (NITROSTAT) 0.4 MG SL tablet PLACE 1 TAB UNDER TONGUE EVERY 3 MINUTES AS DIRECTED UP TO 3 TIMES AS NEEDED FOR CHEST PAIN. (Patient taking differently: Place 0.4 mg under the tongue every 5 (five) minutes as needed for chest pain.) 25 tablet 0   pantoprazole (PROTONIX) 40 MG tablet take 1 tablet twice daily before a meal 60 tablet 2   rosuvastatin (CRESTOR) 40 MG tablet Take 1 tablet (40 mg total) by mouth daily. 90 tablet 3   tamsulosin (FLOMAX) 0.4 MG CAPS capsule TAKE 1 CAPSULE BY MOUTH AT BEDTIME. 90 capsule 3   VYVANSE 50 MG capsule Take 50 mg by mouth daily.     No current facility-administered medications on file prior to visit.    Allergies:  Allergies  Allergen Reactions   Ibuprofen Hives and Itching   Benazepril Hives   Diphenhydramine Hives   Latex Itching and Dermatitis    States she had redness to skin and itching    Metronidazole Nausea And Vomiting   Oxycodone     Pt has tolerated hydromorphone in the past and takes Norco at home.   Toradol [Ketorolac Tromethamine] Itching   Zofran [Ondansetron Hcl] Hives   Adhesive [Tape] Rash   Codeine Hives, Nausea And Vomiting and Rash   Loratadine Rash   Nystatin-Triamcinolone Rash   Penicillins Rash    Has patient had a PCN reaction causing immediate rash, facial/tongue/throat swelling, SOB or lightheadedness with hypotension: No Has patient had a PCN reaction causing severe rash involving mucus membranes or skin necrosis: Yes Has patient had a PCN reaction that required hospitalization: No Has patient had a PCN  reaction occurring within the last 10 years: no  If all of the above answers are "NO", then may proceed with Cephalosporin use. Other reaction(s): nausea Received ancef w/o issue (7/23)    Vital Signs:  There were no vitals taken for this visit.    Neurological Exam: MENTAL STATUS including orientation to time, place, person, recent and remote memory, attention span and concentration, language, and fund of knowledge is normal.  Speech is not dysarthric.  CRANIAL NERVES:    Face is symmetric.   MOTOR:  Motor strength is 5/5 in all extremities.  No atrophy, fasciculations or abnormal movements.  No pronator drift.  Tone is normal.    COORDINATION/GAIT:  Gait narrow based and stable, assisted with cane  Data: NCS/EMG of the arms 10/16/2022: Right ulnar neuropathy with slowing across the elbow, demyelinating, moderate in degree electrically and with evidence of active denervation. 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   Thank you for allowing me to participate in patient's care.  If I can answer any additional questions, I would be pleased to do so.    Sincerely,    Ernest Orr K. Allena Katz, DO

## 2022-10-16 NOTE — Telephone Encounter (Signed)
-----   Message from Glendale Chard sent at 10/16/2022 12:56 PM EDT ----- Please refer patient to [facility] for right ulnar neuropathy at the elbow.  She is already seeing Dr. Shon Baton.  Please fax today's EMG and note. Thank you.

## 2022-10-16 NOTE — Procedures (Addendum)
[facility]  [address] [place], [address]  [place], [place] [address] Tel: [phone] Fax: [phone] Test Date:  10/16/2022  Patient: Sandra [name] DOB: [birth year] Physician: Nita Sickle, DO  Sex: Female Height: 5\' 0"  Ref Phys: Nita Sickle, DO  ID#: [id number]   Technician:    History: This is a 47 year old female referred for evaluation of bilateral hand paresthesias, worse on the right.  NCV & EMG Findings: Extensive electrodiagnostic testing of the right upper extremity and additional studies of the left shows:  Bilateral median, ulnar, and mixed palmar sensory responses are within normal limits. Bilateral median and left ulnar motor responses are within normal limits.  Right ulnar motor response shows slowed conduction velocity and conduction block across the elbow. Chronic motor axonal loss changes are seen affecting bilateral C5-C6 myotomes as well as the right   Impression: Right ulnar neuropathy with slowing across the elbow, demyelinating with conduction block, moderate in degree electrically and with evidence of active denervation. 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   ___________________________ Nita Sickle, DO    Nerve Conduction Studies   Stim Site NR Peak (ms) Norm Peak (ms) O-P Amp (V) Norm O-P Amp  Left Median Anti Sensory (2nd Digit)  32 C  Wrist    3.0 <3.4 25.8 >20  Right Median Anti Sensory (2nd Digit)  32 C  Wrist    2.7 <3.4 32.5 >20  Left Ulnar Anti Sensory (5th Digit)  32 C  Wrist    2.5 <3.1 26.6 >12  Right Ulnar Anti Sensory (5th Digit)  32 C  Wrist    2.3 <3.1 21.1 >12     Stim Site NR Onset (ms) Norm Onset (ms) O-P Amp (mV) Norm O-P Amp Site1 Site2 Delta-0 (ms) Dist (cm) Vel (m/s) Norm Vel (m/s)  Left Median Motor (Abd Poll Brev)  32 C  Wrist    3.2 <3.9 8.1 >6 Elbow Wrist 4.7 28.0 60 >50  Elbow    7.9  7.6         Right Median Motor (Abd Poll Brev)  32 C  Wrist    3.0 <3.9 8.1  >6 Elbow Wrist 4.3 28.0 65 >50  Elbow    7.3  8.0         Left Ulnar Motor (Abd Dig Minimi)  32 C  Wrist    2.3 <3.1 7.4 >7 B Elbow Wrist 3.3 21.0 64 >50  B Elbow    5.6  6.8  A Elbow B Elbow 1.6 10.0 62 >50  A Elbow    7.2  6.8         Right Ulnar Motor (Abd Dig Minimi)  32 C  Wrist    2.4 <3.1 8.3 >7 B Elbow Wrist 2.6 19.0 65 >50  B Elbow    5.0  7.0  A Elbow B Elbow 2.1 10.0 *42 >50  A Elbow    7.1  5.2            Stim Site NR Peak (ms) Norm Peak (ms) P-T Amp (V) Site1 Site2 Delta-P (ms) Norm Delta (ms)  Left Median/Ulnar Palm Comparison (Wrist - 8cm)  32 C  Median Palm    1.8 <2.2 37.8 Median Palm Ulnar Palm 0.2   Ulnar Palm    1.6 <2.2 13.8      Right Median/Ulnar Palm Comparison (Wrist - 8cm)  32 C  Median Palm    1.6 <2.2 59.2 Median Palm Ulnar Palm 0.3  Ulnar Palm    1.3 <2.2 19.5       Electromyography   Side Muscle Ins.Act Fibs Fasc Recrt Amp Dur Poly Activation Comment  Right 1stDorInt Nml *1+ Nml *1- *1+ *1+ *1+ Nml N/A  Right PronatorTeres Nml Nml Nml Nml Nml Nml Nml Nml N/A  Right Biceps Nml Nml Nml *1- *1+ *1+ *1+ Nml N/A  Right Triceps Nml Nml Nml Nml Nml Nml Nml Nml N/A  Right Deltoid Nml Nml Nml *2- *1+ *1+ *1+ Nml N/A  Right Abd Dig Min Nml *1+ Nml *1- *1+ *1+ *1+ Nml N/A  Right FlexCarpiUln Nml *1+ Nml *2- *1+ *1+ *1+ Nml N/A  Left 1stDorInt Nml Nml Nml Nml Nml Nml Nml Nml N/A  Left PronatorTeres Nml Nml Nml Nml Nml Nml Nml Nml N/A  Left Biceps Nml Nml Nml Nml Nml Nml Nml Nml N/A  Left Triceps Nml Nml Nml *1- *1+ *1+ *1+ Nml N/A  Left Deltoid Nml Nml Nml *2- *1+ *1+ *1+ Nml N/A      Waveforms:

## 2022-10-21 NOTE — Therapy (Signed)
OUTPATIENT PHYSICAL THERAPY VESTIBULAR EVALUATION     Patient Name: Sandra [name] MRN: [id number] DOB:01/31/1975, 47 y.o., female Today's Date: 10/21/2022  END OF SESSION:   Past Medical History:  Diagnosis Date   ADHD (attention deficit hyperactivity disorder)    Allergic rhinitis    Anemia    hx of   Anxiety    Arthritis    Barrett's esophagus    Bipolar 1 disorder (HCC)    Bladder tumor    CAD (coronary artery disease)    Cancer (HCC)    bladder   Constipation    Depression    Dilated cardiomyopathy (HCC) 12/20/2020   Echo 6/22 ([facility]): EF 43, mild MR, mild TR // Echo 3/22: EF 50-55   Dizziness    Full dentures    GERD (gastroesophageal reflux disease)    Gross hematuria    Hyperlipidemia    Hypertension    Stable angina    Type 2 diabetes mellitus (HCC)    Urgency of urination    dysuria, sui   Wears glasses    Past Surgical History:  Procedure Laterality Date   ABDOMINAL HYSTERECTOMY     ANTERIOR CERVICAL DECOMP/DISCECTOMY FUSION N/A 03/05/2022   Procedure: ANTERIOR CERVICAL DECOMPRESSION/DISCECTOMY FUSION 1 LEVEL; C6-C7;  Surgeon: Venita Lick, MD;  Location: [facility];  Service: Orthopedics;  Laterality: N/A;  3 hrs 3 C-Bed   BALLOON DILATION N/A 02/17/2021   Procedure: BALLOON DILATION;  Surgeon: Lanelle Bal, DO;  Location: [facility];  Service: Endoscopy;  Laterality: N/A;   BIOPSY  02/17/2021   Procedure: BIOPSY;  Surgeon: Lanelle Bal, DO;  Location: [facility];  Service: Endoscopy;;   COLONOSCOPY WITH PROPOFOL N/A 02/17/2021   Surgeon: Lanelle Bal, DO; nonbleeding internal hemorrhoids, otherwise normal exam.  Repeat in 10 years.   CYSTOSCOPY N/A 10/03/2015   Procedure: CYSTOSCOPY;  Surgeon: Bjorn Pippin, MD;  Location: [facility];  Service: Urology;  Laterality: N/A;   CYSTOSCOPY W/ URETERAL STENT PLACEMENT Right 07/24/2021   Procedure: CYSTOSCOPY WITH RETROGRADE PYELOGRAM/URETERAL STENT PLACEMENT;  Surgeon:  Malen Gauze, MD;  Location: [facility];  Service: Urology;  Laterality: Right;   CYSTOSCOPY WITH RETROGRADE PYELOGRAM, URETEROSCOPY AND STENT PLACEMENT Right 08/14/2021   Procedure: CYSTOSCOPY WITH RETROGRADE PYELOGRAM, URETEROSCOPY AND STENT EXCHANGE;  Surgeon: Malen Gauze, MD;  Location: [facility];  Service: Urology;  Laterality: Right;   CYSTOSCOPY WITH STENT PLACEMENT Right 10/31/2015   Procedure: CYSTOSCOPY WITH ATTEMPTED RIGHT URETERAL OPENING;  Surgeon: Bjorn Pippin, MD;  Location: [facility];  Service: Urology;  Laterality: Right;   ESOPHAGOGASTRODUODENOSCOPY (EGD) WITH PROPOFOL N/A 02/17/2021   Surgeon: Lanelle Bal, DO; 2 cm hiatal hernia, Barrett's esophagus, gastritis with biopsies negative for H. pylori.  S/p empiric esophageal dilation.  Repeat in 3 years.   FOOT SURGERY Left    September 2022, broke 7 bones in left foot   INTERSTIM IMPLANT PLACEMENT N/A 03/14/2018   Procedure: Leane Platt IMPLANT FIRST STAGE;  Surgeon: Malen Gauze, MD;  Location: [facility];  Service: Urology;  Laterality: N/A;   INTERSTIM IMPLANT PLACEMENT N/A 03/30/2018   Procedure: Leane Platt IMPLANT SECOND STAGE;  Surgeon: Malen Gauze, MD;  Location: [facility];  Service: Urology;  Laterality: N/A;   INTERSTIM IMPLANT REVISION N/A 12/01/2021   Procedure: REVISION OF Leane Platt- generator anf lead replacement;  Surgeon: Malen Gauze, MD;  Location: [facility];  Service: Urology;  Laterality: N/A;   LEFT HEART CATH AND CORONARY  ANGIOGRAPHY N/A 05/31/2020   Procedure: LEFT HEART CATH AND CORONARY ANGIOGRAPHY;  Surgeon: Lennette Bihari, MD;  Location: [facility];  Service: Cardiovascular;  Laterality: N/A;   MULTIPLE TOOTH EXTRACTIONS  2015   OVARIAN CYST REMOVAL Right 2004 approx   SHOULDER OPEN ROTATOR CUFF REPAIR Left 04/20/2019   Procedure: ROTATOR CUFF REPAIR SHOULDER OPEN;  Surgeon: Vickki Hearing, MD;  Location: [facility];  Service: Orthopedics;  Laterality: Left;    TONSILLECTOMY  12/30/2004   TOTAL ABDOMINAL HYSTERECTOMY W/ BILATERAL SALPINGOOPHORECTOMY  05/16/2004   TRANSURETHRAL RESECTION OF BLADDER TUMOR N/A 10/03/2015   Procedure: TRANSURETHRAL RESECTION OF BLADDER TUMOR (TURBT);  Surgeon: Bjorn Pippin, MD;  Location: [facility];  Service: Urology;  Laterality: N/A;   TRANSURETHRAL RESECTION OF BLADDER TUMOR N/A 10/31/2015   Procedure: RE-STAGINGG TRANSURETHRAL RESECTION OF BLADDER TUMOR (TURBT);  Surgeon: Bjorn Pippin, MD;  Location[facility];  Service: Urology;  Laterality: N/A;   TRANSURETHRAL RESECTION OF BLADDER TUMOR N/A 08/17/2016   Procedure: TRANSURETHRAL RESECTION OF BLADDER TUMOR (TURBT);  Surgeon: Malen Gauze, MD;  Location: [facility];  Service: Urology;  Laterality: N/A;   URETERAL BIOPSY Right 08/14/2021   Procedure: URETERAL BIOPSY;  Surgeon: Malen Gauze, MD;  Location: [facility];  Service: Urology;  Laterality: Right;   Patient Active Problem List   Diagnosis Date Noted   Cervical myelopathy (HCC) 03/05/2022   Intractable nausea and vomiting 07/18/2021   Abdominal pain 07/18/2021   Hydroureteronephrosis 07/18/2021   Recurrent falls 07/18/2021   Obesity (BMI 30-39.9) 07/18/2021   Chronic idiopathic constipation 06/18/2021   Constipation 02/06/2021   Dysphagia 02/06/2021   Hemorrhoids 02/06/2021   Hyperkalemia 12/20/2020   Dilated cardiomyopathy (HCC) 12/20/2020   CAD (coronary artery disease) 12/19/2020   Familial hypercholesterolemia 12/19/2020   Rectal bleeding 05/17/2020   Gastroesophageal reflux disease 05/17/2020   Odynophagia 05/17/2020   Essential hypertension, benign 05/15/2019   S/P left rotator cuff repair 04/20/19  04/27/2019   Complete tear of left rotator cuff    Type 2 diabetes mellitus with hyperglycemia (HCC) 10/10/2018   Mixed hyperlipidemia 10/10/2018   Sacroiliac joint pain 02/23/2017   Chronic low back pain 02/09/2017   Somatic dysfunction of left sacroiliac  joint 02/09/2017   GAD (generalized anxiety disorder) 12/17/2016   MDD (major depressive disorder), recurrent episode, moderate (HCC) 12/17/2016   Attention deficit hyperactivity disorder (ADHD) 12/17/2016   Postoperative anemia due to acute blood loss 11/04/2015   Bladder cancer (HCC) 10/31/2015   CARPAL TUNNEL SYNDROME 10/02/2009    PCP: Wilmon Pali., FNP REFERRING PROVIDER: Glendale Chard, DO  REFERRING DIAG: H81.10 (ICD-10-CM) - Benign paroxysmal positional vertigo, unspecified laterality R20.0,R20.2 (ICD-10-CM) - Bilateral numbness and tingling of arms and legs  THERAPY DIAG:  No diagnosis found.  ONSET DATE: ***  Rationale for Evaluation and Treatment: Rehabilitation  SUBJECTIVE:   SUBJECTIVE STATEMENT: *** Pt accompanied by: {accompnied:27141}  PERTINENT HISTORY: ***  PAIN:  Are you having pain? {OPRCPAIN:27236}  PRECAUTIONS: {Therapy precautions:24002}  RED FLAGS: {PT Red Flags:29287}   WEIGHT BEARING RESTRICTIONS: {Yes ***/No:24003}  FALLS: Has patient fallen in last 6 months? {fallsyesno:27318}  LIVING ENVIRONMENT: Lives with: {OPRC lives with:25569::"lives with their family"} Lives in: {Lives in:25570} Stairs: {opstairs:27293} Has following equipment at home: {Assistive devices:23999}  PLOF: {PLOF:24004}  PATIENT GOALS: ***  OBJECTIVE:   DIAGNOSTIC FINDINGS: ***  COGNITION: Overall cognitive status: {cognition:24006}   SENSATION: {sensation:27233}  EDEMA:  {edema:24020}  MUSCLE TONE:  {LE tone:25568}  DTRs:  {DTR SITE:24025}  POSTURE:  {posture:25561}  Cervical ROM:    Active A/PROM (deg) eval  Flexion   Extension   Right lateral flexion   Left lateral flexion   Right rotation   Left rotation   (Blank rows = not tested)  STRENGTH: ***  LOWER EXTREMITY MMT:   MMT Right eval Left eval  Hip flexion    Hip abduction    Hip adduction    Hip internal rotation    Hip external rotation    Knee flexion    Knee  extension    Ankle dorsiflexion    Ankle plantarflexion    Ankle inversion    Ankle eversion    (Blank rows = not tested)  BED MOBILITY:  {Bed mobility:24027}  TRANSFERS: Assistive device utilized: {Assistive devices:23999}  Sit to stand: {Levels of assistance:24026} Stand to sit: {Levels of assistance:24026} Chair to chair: {Levels of assistance:24026} Floor: {Levels of assistance:24026}  RAMP: {Levels of assistance:24026}  CURB: {Levels of assistance:24026}  GAIT: Gait pattern: {gait characteristics:25376} Distance walked: *** Assistive device utilized: {Assistive devices:23999} Level of assistance: {Levels of assistance:24026} Comments: ***  FUNCTIONAL TESTS:  {Functional tests:24029}  PATIENT SURVEYS:  {rehab surveys:24030}  VESTIBULAR ASSESSMENT:  GENERAL OBSERVATION: ***   SYMPTOM BEHAVIOR:  Subjective history: ***  Non-Vestibular symptoms: {nonvestibular symptoms:25260}  Type of dizziness: {Type of Dizziness:25255}  Frequency: ***  Duration: ***  Aggravating factors: {Aggravating Factors:25258}  Relieving factors: {Relieving Factors:25259}  Progression of symptoms: {DESC; BETTER/WORSE:18575}  OCULOMOTOR EXAM:  Ocular Alignment: {Ocular Alignment:25262}  Ocular ROM: {RANGE OF MOTION:21649}  Spontaneous Nystagmus: {Spontaneous nystagmus:25263}  Gaze-Induced Nystagmus: {gaze-induced nystagmus:25264}  Smooth Pursuits: {smooth pursuit:25265}  Saccades: {saccades:25266}  Convergence/Divergence: *** cm   FRENZEL - FIXATION SUPRESSED:  Ocular Alignment: {Ocular Alignment:25262}  Spontaneous Nystagmus: {Spontaneous nystagmus:25263}  Gaze-Induced Nystagmus: {gaze-induced nystagmus:25264}  Horizontal head shaking - induced nystagmus: {head shaking induced nystagmus:25267}  Vertical head shaking - induced nystagmus: {head shaking induced nystagmus:25267}  Positional tests: {Positional tests:25271}  Pressure tests: {frenzel pressure tests:25268}  VESTIBULAR  - OCULAR REFLEX:   Slow VOR: {slow VOR:25290}  VOR Cancellation: {vor cancellation:25291}  Head-Impulse Test: {head impulse test:25272}  Dynamic Visual Acuity: {dynamic visual acuity:25273}   POSITIONAL TESTING: {Positional tests:25271}  MOTION SENSITIVITY:  Motion Sensitivity Quotient Intensity: 0 = none, 1 = Lightheaded, 2 = Mild, 3 = Moderate, 4 = Severe, 5 = Vomiting  Intensity  1. Sitting to supine   2. Supine to L side   3. Supine to R side   4. Supine to sitting   5. L Hallpike-Dix   6. Up from L    7. R Hallpike-Dix   8. Up from R    9. Sitting, head tipped to L knee   10. Head up from L knee   11. Sitting, head tipped to R knee   12. Head up from R knee   13. Sitting head turns x5   14.Sitting head nods x5   15. In stance, 180 turn to L    16. In stance, 180 turn to R     OTHOSTATICS: {Exam; orthostatics:31331}  FUNCTIONAL GAIT: {Functional tests:24029}   VESTIBULAR TREATMENT:  DATE: 10/09/22 Evaluation and patient education  Canalith Repositioning:  {Canalith Repositioning:25283} Gaze Adaptation:  {gaze adaptation:25286} Habituation:  {habituation:25288} Other: ***  PATIENT EDUCATION: Education details: Educated on the pathoanatomy of _. Educated on the goals and course of rehab.  Person educated: {Person educated:25204} Education method: {Education Method:25205} Education comprehension: {Education Comprehension:25206}  HOME EXERCISE PROGRAM:  GOALS: Goals reviewed with patient? Yes  SHORT TERM GOALS: Target date: ***  *** Baseline: Goal status: {GOALSTATUS:25110}  2.  *** Baseline:  Goal status: {GOALSTATUS:25110}  3.  *** Baseline:  Goal status: {GOALSTATUS:25110}  4.  *** Baseline:  Goal status: {GOALSTATUS:25110}  5.  *** Baseline:  Goal status: {GOALSTATUS:25110}  6.  *** Baseline:  Goal status: {GOALSTATUS:25110}  LONG  TERM GOALS: Target date: ***  *** Baseline:  Goal status: {GOALSTATUS:25110}  2.  *** Baseline:  Goal status: {GOALSTATUS:25110}  3.  *** Baseline:  Goal status: {GOALSTATUS:25110}  4.  *** Baseline:  Goal status: {GOALSTATUS:25110}  5.  *** Baseline:  Goal status: {GOALSTATUS:25110}  6.  *** Baseline:  Goal status: {GOALSTATUS:25110}  ASSESSMENT:  CLINICAL IMPRESSION: Patient is a *** y.o. *** who was seen today for physical therapy evaluation and treatment for ***.   OBJECTIVE IMPAIRMENTS: {opptimpairments:25111}.   ACTIVITY LIMITATIONS: {activitylimitations:27494}  PARTICIPATION LIMITATIONS: {participationrestrictions:25113}  PERSONAL FACTORS: {Personal factors:25162} are also affecting patient's functional outcome.   REHAB POTENTIAL: {rehabpotential:25112}  CLINICAL DECISION MAKING: {clinical decision making:25114}  EVALUATION COMPLEXITY: {Evaluation complexity:25115}   PLAN:  PT FREQUENCY: {rehab frequency:25116}  PT DURATION: {rehab duration:25117}  PLANNED INTERVENTIONS: {rehab planned interventions:25118::"Therapeutic exercises","Therapeutic activity","Neuromuscular re-education","Balance training","Gait training","Patient/Family education","Self Care","Joint mobilization"}  PLAN FOR NEXT SESSION: ***   3:29 PM, 10/21/22 [facility] Sees Small Izzy Doubek MPT [place] [facility] [place] [phone]

## 2022-10-22 ENCOUNTER — Ambulatory Visit (HOSPITAL_COMMUNITY): Payer: MEDICAID | Attending: Neurology

## 2022-10-22 ENCOUNTER — Other Ambulatory Visit: Payer: Self-pay

## 2022-10-22 DIAGNOSIS — R2681 Unsteadiness on feet: Secondary | ICD-10-CM | POA: Diagnosis present

## 2022-10-22 DIAGNOSIS — R200 Anesthesia of skin: Secondary | ICD-10-CM | POA: Diagnosis not present

## 2022-10-22 DIAGNOSIS — R202 Paresthesia of skin: Secondary | ICD-10-CM | POA: Diagnosis not present

## 2022-10-22 DIAGNOSIS — R262 Difficulty in walking, not elsewhere classified: Secondary | ICD-10-CM | POA: Diagnosis present

## 2022-10-22 DIAGNOSIS — R42 Dizziness and giddiness: Secondary | ICD-10-CM | POA: Insufficient documentation

## 2022-10-22 DIAGNOSIS — R2689 Other abnormalities of gait and mobility: Secondary | ICD-10-CM | POA: Diagnosis present

## 2022-10-22 DIAGNOSIS — H8110 Benign paroxysmal vertigo, unspecified ear: Secondary | ICD-10-CM | POA: Insufficient documentation

## 2022-10-22 NOTE — Progress Notes (Signed)
Cardiology Office Note:    Date:  11/02/2022   ID:  [name], DOB 1975-04-06, MRN [id number]  PCP:  Wilmon Pali, FNP   [facility] Providers Cardiologist:  Charlton Haws, MD     Referring MD: Wilmon Pali, FNP   Chief Complaint:  No chief complaint on file.     Patient Profile:   Sandra [name] is a 47 y.o. female with:  Coronary artery disease  Cath 5/22:  diffuse small vessel disease (diabetic); p-mRCA 70; occluded PDA w/ L-R collats; OM2 60 >> med Rx Myoview 6/22 ([facility]): no ischemia, EF 34 Myoview 2/22: no ischemia, EF 45, ?inf infarct  Echocardiogram 3/22: inf HK-AK, EF 50-55 Unable to get cMRI due to interstim implant  Hypertension  Hyperlipidemia  Diabetes mellitus  ADHD Bipolar d/o Bladder CA S/p interstim implant  GERD  Husband died due to COVID-19 in 2022-09-15 Allergic to ACEi (urticaria with benazepril)   History of Present Illness: Sandra [name]  has small vessel diabetic disease.  Cath done 05/31/20 with occluded PDA with left to right collaterals , 60% OM2 and  50-70% proximal and mid RCA   Seen in [facility] ER 02/07/21 She was concerned about aortic aneurysm but review of scan only shows mild aortic ectasia 3.9 cm    Sees Hilty for familial HLD. On repatha crestor and fibrates BS better controlled Smoking    Hospitalized July 2023 with intractable nausea Had right hydronephrosis with stenting by Dr Ronne Binning 08/14/21    She adopted 2 daughters in past One died and she has her 38 yo grandson that recently went to her sisters house to live due to patients multiple medical issues. She has a lawyer looking into disability  Had neck surgery on 03/05/22 She has had arm pain/ paraesthesias, fatigue , malaise and atypical chest pain since She has not taken any nitro. BP elevated today and she has not taken her Toprol, imdur , or Norvasc Indicates not smoking since surgery   Seen in ED 04/23/22 BP up and headache nausea, vomiting photophobiaTakes hydrocodone ?  Migraine CT head negative R/O Myovue 05/05/22 low risk small defect in apex ? Breast artifact EF 54% no RWMA in area   BS poorly controlled A1c 7.8 but better than previous.     Prior CV studies: Myoview 06/21/20 ([facility]) No evidence of inducible myocardial ischemia or myocardial infarction. LVEF 34%.    Echocardiogram 06/19/20 ([facility]) EF 43, mild LVH, mild RV systolic dysfunction, mild MR, mild TR, trivial TR  Echocardiogram June 2023  Aorta 3.7 cm EF 50-55% improved    LEFT HEART CATH AND CORONARY ANGIOGRAPHY 05/31/2020 RCA proximal 30, mid 70, distal 50; RPDA 100 LCx proximal 40, mid 20; OM1 30, OM2 60 LAD proximal 20, mid 20 RI 30 you to thank you    Echocardiogram 03/27/20 Severe inferior HK/AK, EF 50-55, mild LVH, normal RVSF, trivial MR   NM Myocar Multi W/Spect W/Wall Motion / EF 03/07/2020 No large ischemia, EF 45, low risk  NM Myocar Multi W/Spect W/Wall Motion 05/05/22    Narrative & Impression      Stress ECG is negative for ischemia and arrythmias   LV perfusion is abnormal. There is a small reversible perfusion defect with mild reduction in uptake present in the apical anterior location with normal wall motion in the defect area consistent with ischemia. Breast attenuation artifact cannot be completely ruled out.   Left ventricular function is abnormal. Global function is mildly reduced. Nuclear stress EF:  54 %. Consider 2D Echo for accurate estimation of LVEF.   The study is normal. The study is intermediate risk based on LVEF 54% otherwise low risk study.   Past Medical History:  Diagnosis Date   ADHD (attention deficit hyperactivity disorder)    Allergic rhinitis    Anemia    hx of   Anxiety    Arthritis    Barrett's esophagus    Bipolar 1 disorder (HCC)    Bladder tumor    CAD (coronary artery disease)    Cancer (HCC)    bladder   Constipation    Depression    Dilated cardiomyopathy (HCC) 12/20/2020   Echo 6/22 ([facility]): EF 43, mild MR, mild TR // Echo  3/22: EF 50-55   Dizziness    Full dentures    GERD (gastroesophageal reflux disease)    Gross hematuria    Hyperlipidemia    Hypertension    Stable angina (HCC)    Type 2 diabetes mellitus (HCC)    Urgency of urination    dysuria, sui   Wears glasses    Current Medications: Current Meds  Medication Sig   ACCU-CHEK GUIDE test strip USE AS INSTRUCTED TO MONITOR GLUCOSE 4 TIMES DAILY BEFORE MEALS AND BEFORE BED.   Accu-Chek Softclix Lancets lancets Use as instructed to monitor glucose 4 times daily   albuterol (PROAIR HFA) 108 (90 Base) MCG/ACT inhaler Inhale 2 puffs into the lungs every 6 (six) hours as needed for wheezing or shortness of breath.   amLODipine (NORVASC) 5 MG tablet Take 2 tablets (10 mg total) by mouth daily.   aspirin EC 81 MG tablet Take 1 tablet (81 mg total) by mouth daily with breakfast. Swallow whole.   benzonatate (TESSALON) 100 MG capsule Take 1 capsule (100 mg total) by mouth every 8 (eight) hours.   Blood Glucose Monitoring Suppl (ACCU-CHEK GUIDE) w/Device KIT 1 Piece by Does not apply route as directed.   Blood Pressure Monitoring (BLOOD PRESSURE CUFF) MISC See admin instructions.   buPROPion (WELLBUTRIN XL) 300 MG 24 hr tablet Take 300 mg by mouth every morning.   cloNIDine HCl (KAPVAY) 0.1 MG TB12 ER tablet Take 0.1 mg by mouth at bedtime.   Continuous Blood Gluc Transmit (DEXCOM G6 TRANSMITTER) MISC 1 Device by Does not apply route every 3 (three) months.   Continuous Glucose Sensor (DEXCOM G6 SENSOR) MISC apply new sensor every 10 days as directed.   cyclobenzaprine (FLEXERIL) 10 MG tablet Take 1 tablet (10 mg total) by mouth 3 (three) times daily as needed for muscle spasms.   Evolocumab (REPATHA SURECLICK) 140 MG/ML SOAJ INJECT 1 DOSE INTO THE SKIN EVERY 14 DAYS.   fenofibrate (TRICOR) 145 MG tablet Take 1 tablet (145 mg total) by mouth daily.   FLUoxetine (PROZAC) 20 MG capsule Take 20 mg by mouth at bedtime.   FLUoxetine (PROZAC) 40 MG capsule Take 40  mg by mouth in the morning.   fluticasone (FLONASE) 50 MCG/ACT nasal spray Place 2 sprays into both nostrils daily.   gabapentin (NEURONTIN) 600 MG tablet Take 600 mg by mouth 3 (three) times daily.   GLOBAL EASE INJECT PEN NEEDLES 31G X 5 MM MISC Inject into the skin.   HYDROcodone-acetaminophen (NORCO) 10-325 MG tablet Take 1 tablet by mouth. One tablet four times a day   insulin aspart (NOVOLOG) 100 UNIT/ML injection Use with Omnipod for TDD around 150 units daily   Insulin Aspart FlexPen (NOVOLOG) 100 UNIT/ML INJECT 8-14 UNITS SUB-Q 3 TIMES  A DAY WITH MEALS.   Insulin Disposable Pump (OMNIPOD DASH PDM, GEN 4,) KIT Change pod every 48-72 hours   Insulin Disposable Pump (OMNIPOD DASH PODS, GEN 4,) MISC change pod every 48 hours as directed.   Insulin Pen Needle (PEN NEEDLES) 31G X 6 MM MISC Use to inject insulin 4 times daily   INSULIN SYRINGE 1CC/29G 29G X 1/2" 1 ML MISC Use to inject insulin into Omnipod   isosorbide mononitrate (IMDUR) 30 MG 24 hr tablet Take 1 tablet (30 mg total) by mouth daily.   linaclotide (LINZESS) 72 MCG capsule Take 1 capsule (72 mcg total) by mouth daily before breakfast.   meclizine (ANTIVERT) 25 MG tablet Take 25 mg by mouth 3 (three) times daily as needed.   metoprolol succinate (TOPROL-XL) 50 MG 24 hr tablet Take 1 tablet (50 mg total) by mouth daily. Take with or immediately following a meal.   nitroGLYCERIN (NITROSTAT) 0.4 MG SL tablet PLACE 1 TAB UNDER TONGUE EVERY 3 MINUTES AS DIRECTED UP TO 3 TIMES AS NEEDED FOR CHEST PAIN. (Patient taking differently: Place 0.4 mg under the tongue every 5 (five) minutes as needed for chest pain.)   pantoprazole (PROTONIX) 40 MG tablet take 1 tablet twice daily before a meal   rosuvastatin (CRESTOR) 40 MG tablet Take 1 tablet (40 mg total) by mouth daily.   tamsulosin (FLOMAX) 0.4 MG CAPS capsule TAKE 1 CAPSULE BY MOUTH AT BEDTIME.   VRAYLAR 1.5 MG capsule Take 1.5 mg by mouth daily.   VYVANSE 50 MG capsule Take 50 mg by  mouth daily.    Allergies:   Ibuprofen, Benazepril, Diphenhydramine, Latex, Metronidazole, Oxycodone, Toradol [ketorolac tromethamine], Zofran [ondansetron hcl], Adhesive [tape], Codeine, Loratadine, Nystatin-triamcinolone, and Penicillins   Social History   Tobacco Use   Smoking status: Former    Current packs/day: 0.00    Average packs/day: 0.5 packs/day for 10.0 years (5.0 ttl pk-yrs)    Types: Cigarettes    Start date: 02/01/2011    Quit date: 01/31/2021    Years since quitting: 1.7   Smokeless tobacco: Never   Tobacco comments:    02/06/21-no smoking past 5 days  Vaping Use   Vaping status: Never Used  Substance Use Topics   Alcohol use: No   Drug use: No    Family Hx: The patient's family history includes Breast cancer in her maternal aunt; Cancer in her mother and another family member; Diabetes in her daughter; Hypertension in her mother; Lung cancer in her maternal uncle. There is no history of Colon cancer.  ROS see HPI  EKGs/Labs/Other Test Reviewed:    EKG:  EKG is  ordered today.  SR rate 97 read as normal but old IMI evident no acute ST changes   Recent Labs: 04/23/2022: ALT 28; B Natriuretic Peptide 15.0; BUN 15; Creatinine, Ser 0.75; Hemoglobin 12.8; Platelets 378; Potassium 3.9; Sodium 131   Recent Lipid Panel Lab Results  Component Value Date/Time   CHOL 175 07/20/2021 07:59 AM   CHOL 246 (H) 06/05/2020 08:53 AM   TRIG 245 (H) 07/20/2021 07:59 AM   HDL 27 (L) 07/20/2021 07:59 AM   HDL 31 (L) 06/05/2020 08:53 AM   LDLCALC 99 07/20/2021 07:59 AM   LDLCALC 163 (H) 06/05/2020 08:53 AM     Risk Assessment/Calculations:          Physical Exam:    VS:  BP (!) 140/94 (BP Location: Left Arm, Patient Position: Sitting, Cuff Size: Normal)   Pulse 98  Ht 5' (1.524 m)   Wt 184 lb 9.6 oz (83.7 kg)   SpO2 99%   BMI 36.05 kg/m     Wt Readings from Last 3 Encounters:  11/02/22 184 lb 9.6 oz (83.7 kg)  09/21/22 187 lb (84.8 kg)  09/15/22 185 lb 6.4 oz  (84.1 kg)    Affect appropriate Chronically ill female  HEENT: normal xanthelasma  Neck supple with no adenopathy JVP normal no bruits no thyromegaly Lungs COPD with mild exp wheezing  Heart:  S1/S2 no murmur, no rub, gallop or click PMI normal Abdomen: benighn, BS positve, no tenderness, no AAA no bruit.  No HSM or HJR Distal pulses intact with no bruits No edema Neuro non-focal Skin warm and dry Xanthelasma over eyes  No muscular weakness   Plan:  Familial hypercholesterolemia She currently remains on rosuvastatin 40 mg daily, fenofibrate 145 mg daily, evolocumab 140 mg every 14 days.  Imporoved with LDL particle # 1216 and LDL 99 F/U Dr Caralyn Guile   DM:   poorly controlled elevated A1c  insulin per primary    CAD (coronary artery disease) Cardiac catheterization in 5/22 with diffuse small vessel diabetic disease.  She has moderate disease in the proximal to mid RCA and occluded PDA with left-to-right collaterals as well as moderate disease in OM 2.  She has been managed medically. Noncompliant with all her meds and BP elevated Refill toprol 50 mg , imdur 30 mg and norvasc 5 mg Lexiscan myovue low risk small apical defect ? Breast attenuation EF 54%05/05/22   Essential hypertension, benign Compliance issues labile  reasonable control    Dilated cardiomyopathy (HCC) Improved by TTE 06/17/21 to 50-55%  Normal on myovue Rx BP  Aortic Ectasia:  not significant only 3.9 cm by CT  only 3.7 cm on TTE June 2023   Urology:  F/U [facility] post stent right ureter f/u [facility]  Pulmonary:  persistent cough ? Atypical pneumonia post antibiotic RX Smoker will check lung cancer CT   Ortho:  f/u Dr Shon Baton regarding recent anterior cervical spine surgery and bilateral UE paresthesias     Medication Adjustments/Labs and Tests Ordered: Current medicines are reviewed at length with the patient today.  Concerns regarding medicines are outlined above.  Tests Ordered:  None   Medication  Changes: No orders of the defined types were placed in this encounter.    F/U in a year  Signed, Charlton Haws, MD  11/02/2022 2:50 PM    [facility] [address] [place], [place], [place]  [address] Phone: [phone]; Fax: [phone]

## 2022-10-23 ENCOUNTER — Other Ambulatory Visit: Payer: Self-pay | Admitting: Internal Medicine

## 2022-10-26 ENCOUNTER — Telehealth: Payer: Self-pay | Admitting: Internal Medicine

## 2022-10-26 DIAGNOSIS — E782 Mixed hyperlipidemia: Secondary | ICD-10-CM

## 2022-10-26 NOTE — Telephone Encounter (Signed)
Patient needs lab orders reentered for the NMR, lipoprofile as it has expired.  She is going to a [facility] closer to home, she would like the lab order mailed to her.

## 2022-10-27 ENCOUNTER — Ambulatory Visit (HOSPITAL_COMMUNITY): Payer: MEDICAID | Admitting: Physical Therapy

## 2022-10-27 ENCOUNTER — Telehealth (HOSPITAL_COMMUNITY): Payer: Self-pay | Admitting: Physical Therapy

## 2022-10-27 NOTE — Telephone Encounter (Signed)
First No Show:  PT called no answer.  Left message that her next appointment is on Thursday.  Virgina Organ, PT CLT [phone]

## 2022-10-27 NOTE — Telephone Encounter (Signed)
Called to get clarification on what this patient is asking for. Left a message with call back number

## 2022-10-29 ENCOUNTER — Encounter (HOSPITAL_COMMUNITY): Payer: MEDICAID | Admitting: Physical Therapy

## 2022-10-29 ENCOUNTER — Other Ambulatory Visit: Payer: Self-pay | Admitting: Nurse Practitioner

## 2022-10-29 ENCOUNTER — Telehealth (HOSPITAL_COMMUNITY): Payer: Self-pay | Admitting: Physical Therapy

## 2022-10-29 DIAGNOSIS — E1165 Type 2 diabetes mellitus with hyperglycemia: Secondary | ICD-10-CM

## 2022-10-29 NOTE — Telephone Encounter (Signed)
2nd no show:  No answer, left message of when next visit is and that all other appointments will be cancelled.   Virgina Organ, PT CLT [phone]

## 2022-10-30 NOTE — Addendum Note (Signed)
Addended by: Freddi Starr on: 10/30/2022 01:21 PM   Modules accepted: Orders

## 2022-10-30 NOTE — Telephone Encounter (Signed)
Spoke with pt, lab orders placed and mailed to the patient.

## 2022-11-02 ENCOUNTER — Ambulatory Visit: Payer: MEDICAID | Attending: Cardiovascular Disease | Admitting: Cardiovascular Disease

## 2022-11-02 ENCOUNTER — Encounter: Payer: Self-pay | Admitting: Cardiovascular Disease

## 2022-11-02 VITALS — BP 136/84 | HR 98 | Ht 60.0 in | Wt 184.6 lb

## 2022-11-02 DIAGNOSIS — I420 Dilated cardiomyopathy: Secondary | ICD-10-CM

## 2022-11-02 DIAGNOSIS — E7801 Familial hypercholesterolemia: Secondary | ICD-10-CM | POA: Diagnosis not present

## 2022-11-02 DIAGNOSIS — I10 Essential (primary) hypertension: Secondary | ICD-10-CM | POA: Diagnosis not present

## 2022-11-02 DIAGNOSIS — I2510 Atherosclerotic heart disease of native coronary artery without angina pectoris: Secondary | ICD-10-CM | POA: Diagnosis not present

## 2022-11-02 NOTE — Patient Instructions (Signed)
Medication Instructions:  Your physician recommends that you continue on your current medications as directed. Please refer to the Current Medication list given to you today.   Labwork: None today  Testing/Procedures: None today  Follow-Up: 1 year Dr.Nishan  Any Other Special Instructions Will Be Listed Below (If Applicable).  If you need a refill on your cardiac medications before your next appointment, please call your pharmacy.  

## 2022-11-03 ENCOUNTER — Ambulatory Visit (HOSPITAL_COMMUNITY): Payer: MEDICAID

## 2022-11-03 DIAGNOSIS — R262 Difficulty in walking, not elsewhere classified: Secondary | ICD-10-CM

## 2022-11-03 DIAGNOSIS — R42 Dizziness and giddiness: Secondary | ICD-10-CM

## 2022-11-03 DIAGNOSIS — R2689 Other abnormalities of gait and mobility: Secondary | ICD-10-CM

## 2022-11-03 DIAGNOSIS — R2681 Unsteadiness on feet: Secondary | ICD-10-CM

## 2022-11-03 NOTE — Therapy (Signed)
OUTPATIENT PHYSICAL THERAPY VESTIBULAR TREATMENT     Patient Name: Sandra [name] MRN: [id number] DOB:[birth year], 47 y.o., female Today's Date: 11/03/2022  END OF SESSION:  PT End of Session - 11/03/22 1351     Visit Number 2    Number of Visits 8    Date for PT Re-Evaluation 11/20/22    Authorization Type Sheran Fava request submitted    PT Start Time 1351    PT Stop Time 1430    PT Time Calculation (min) 39 min    Activity Tolerance Patient tolerated treatment well    Behavior During Therapy WFL for tasks assessed/performed             Past Medical History:  Diagnosis Date   ADHD (attention deficit hyperactivity disorder)    Allergic rhinitis    Anemia    hx of   Anxiety    Arthritis    Barrett's esophagus    Bipolar 1 disorder (HCC)    Bladder tumor    CAD (coronary artery disease)    Cancer (HCC)    bladder   Constipation    Depression    Dilated cardiomyopathy (HCC) 12/20/2020   Echo 6/22 ([facility]): EF 43, mild MR, mild TR // Echo 3/22: EF 50-55   Dizziness    Full dentures    GERD (gastroesophageal reflux disease)    Gross hematuria    Hyperlipidemia    Hypertension    Stable angina (HCC)    Type 2 diabetes mellitus (HCC)    Urgency of urination    dysuria, sui   Wears glasses    Past Surgical History:  Procedure Laterality Date   ABDOMINAL HYSTERECTOMY     ANTERIOR CERVICAL DECOMP/DISCECTOMY FUSION N/A 03/05/2022   Procedure: ANTERIOR CERVICAL DECOMPRESSION/DISCECTOMY FUSION 1 LEVEL; C6-C7;  Surgeon: Venita Lick, MD;  Location: [facility];  Service: Orthopedics;  Laterality: N/A;  3 hrs 3 C-Bed   BALLOON DILATION N/A 02/17/2021   Procedure: BALLOON DILATION;  Surgeon: Lanelle Bal, DO;  Location: [facility];  Service: Endoscopy;  Laterality: N/A;   BIOPSY  02/17/2021   Procedure: BIOPSY;  Surgeon: Lanelle Bal, DO;  [facility];  Service: Endoscopy;;   COLONOSCOPY WITH PROPOFOL N/A 02/17/2021   Surgeon: Lanelle Bal,  DO; nonbleeding internal hemorrhoids, otherwise normal exam.  Repeat in 10 years.   CYSTOSCOPY N/A 10/03/2015   Procedure: CYSTOSCOPY;  Surgeon: Bjorn Pippin, MD;  Location: [facility];  Service: Urology;  Laterality: N/A;   CYSTOSCOPY W/ URETERAL STENT PLACEMENT Right 07/24/2021   Procedure: CYSTOSCOPY WITH RETROGRADE PYELOGRAM/URETERAL STENT PLACEMENT;  Surgeon: Malen Gauze, MD;  Location: [facility];  Service: Urology;  Laterality: Right;   CYSTOSCOPY WITH RETROGRADE PYELOGRAM, URETEROSCOPY AND STENT PLACEMENT Right 08/14/2021   Procedure: CYSTOSCOPY WITH RETROGRADE PYELOGRAM, URETEROSCOPY AND STENT EXCHANGE;  Surgeon: Malen Gauze, MD;  Location: [facility];  Service: Urology;  Laterality: Right;   CYSTOSCOPY WITH STENT PLACEMENT Right 10/31/2015   Procedure: CYSTOSCOPY WITH ATTEMPTED RIGHT URETERAL OPENING;  Surgeon: Bjorn Pippin, MD;  Location[facility];  Service: Urology;  Laterality: Right;   ESOPHAGOGASTRODUODENOSCOPY (EGD) WITH PROPOFOL N/A 02/17/2021   Surgeon: Lanelle Bal, DO; 2 cm hiatal hernia, Barrett's esophagus, gastritis with biopsies negative for H. pylori.  S/p empiric esophageal dilation.  Repeat in 3 years.   FOOT SURGERY Left    September 2022, broke 7 bones in left foot   INTERSTIM IMPLANT PLACEMENT N/A 03/14/2018  Procedure: INTERSTIM IMPLANT FIRST STAGE;  Surgeon: Malen Gauze, MD;  Location: [facility];  Service: Urology;  Laterality: N/A;   INTERSTIM IMPLANT PLACEMENT N/A 03/30/2018   Procedure: Leane Platt IMPLANT SECOND STAGE;  Surgeon: Malen Gauze, MD;  Location: [facility];  Service: Urology;  Laterality: N/A;   INTERSTIM IMPLANT REVISION N/A 12/01/2021   Procedure: REVISION OF Leane Platt- generator anf lead replacement;  Surgeon: Malen Gauze, MD;  Location: [facility];  Service: Urology;  Laterality: N/A;   LEFT HEART CATH AND CORONARY ANGIOGRAPHY N/A 05/31/2020   Procedure: LEFT HEART CATH AND CORONARY  ANGIOGRAPHY;  Surgeon: Lennette Bihari, MD;  Location: [facility];  Service: Cardiovascular;  Laterality: N/A;   MULTIPLE TOOTH EXTRACTIONS  2015   OVARIAN CYST REMOVAL Right 2004 approx   SHOULDER OPEN ROTATOR CUFF REPAIR Left 04/20/2019   Procedure: ROTATOR CUFF REPAIR SHOULDER OPEN;  Surgeon: Vickki Hearing, MD;  Location: [facility];  Service: Orthopedics;  Laterality: Left;   TONSILLECTOMY  12/30/2004   TOTAL ABDOMINAL HYSTERECTOMY W/ BILATERAL SALPINGOOPHORECTOMY  05/16/2004   TRANSURETHRAL RESECTION OF BLADDER TUMOR N/A 10/03/2015   Procedure: TRANSURETHRAL RESECTION OF BLADDER TUMOR (TURBT);  Surgeon: Bjorn Pippin, MD;  Location: [facility];  Service: Urology;  Laterality: N/A;   TRANSURETHRAL RESECTION OF BLADDER TUMOR N/A 10/31/2015   Procedure: RE-STAGINGG TRANSURETHRAL RESECTION OF BLADDER TUMOR (TURBT);  Surgeon: Bjorn Pippin, MD;  [facility];  Service: Urology;  Laterality: N/A;   TRANSURETHRAL RESECTION OF BLADDER TUMOR N/A 08/17/2016   Procedure: TRANSURETHRAL RESECTION OF BLADDER TUMOR (TURBT);  Surgeon: Malen Gauze, MD;  Location: [facility];  Service: Urology;  Laterality: N/A;   URETERAL BIOPSY Right 08/14/2021   Procedure: URETERAL BIOPSY;  Surgeon: Malen Gauze, MD;  Location: [facility];  Service: Urology;  Laterality: Right;   Patient Active Problem List   Diagnosis Date Noted   Cervical myelopathy (HCC) 03/05/2022   Intractable nausea and vomiting 07/18/2021   Abdominal pain 07/18/2021   Hydroureteronephrosis 07/18/2021   Recurrent falls 07/18/2021   Obesity (BMI 30-39.9) 07/18/2021   Chronic idiopathic constipation 06/18/2021   Constipation 02/06/2021   Dysphagia 02/06/2021   Hemorrhoids 02/06/2021   Hyperkalemia 12/20/2020   Dilated cardiomyopathy (HCC) 12/20/2020   CAD (coronary artery disease) 12/19/2020   Familial hypercholesterolemia 12/19/2020   Rectal bleeding 05/17/2020   Gastroesophageal reflux  disease 05/17/2020   Odynophagia 05/17/2020   Essential hypertension, benign 05/15/2019   S/P left rotator cuff repair 04/20/19  04/27/2019   Complete tear of left rotator cuff    Type 2 diabetes mellitus with hyperglycemia (HCC) 10/10/2018   Mixed hyperlipidemia 10/10/2018   Sacroiliac joint pain 02/23/2017   Chronic low back pain 02/09/2017   Somatic dysfunction of left sacroiliac joint 02/09/2017   GAD (generalized anxiety disorder) 12/17/2016   MDD (major depressive disorder), recurrent episode, moderate (HCC) 12/17/2016   Attention deficit hyperactivity disorder (ADHD) 12/17/2016   Postoperative anemia due to acute blood loss 11/04/2015   Bladder cancer (HCC) 10/31/2015   CARPAL TUNNEL SYNDROME 10/02/2009    PCP: Wilmon Pali., FNP REFERRING PROVIDER: Glendale Chard, DO  REFERRING DIAG: H81.10 (ICD-10-CM) - Benign paroxysmal positional vertigo, unspecified laterality R20.0,R20.2 (ICD-10-CM) - Bilateral numbness and tingling of arms and legs  THERAPY DIAG:  Dizziness and giddiness  Difficulty in walking, not elsewhere classified  Unsteadiness on feet  Other abnormalities of gait and mobility  ONSET DATE: beginning of August 2024  Rationale for Evaluation  and Treatment: Rehabilitation  SUBJECTIVE:   SUBJECTIVE STATEMENT: When I look at the floor it's moving sometimes; no better since evaluation; has not been able to come due to not having a ride here; arrives with daughter destiny today    Eval;"I need help; I keep falling"; initially had an episode of vertigo; could not get up on her own; vomited; went to ER; diagnosed with vertigo. Hospitalized x 4 days.  Followed up with neurologist.  Who referred to outpatient PT.  Now the room is no longer spinning but she is off balance.  Has had multiple falls; using a cane today.   Pt accompanied by: self  PERTINENT HISTORY: cervical fusion 02/2022 C6-C7 (states she needs another   PAIN:  Are you having pain? Yes: NPRS  scale: 8/10 Pain location: low back Pain description: stabbing; chronic Aggravating factors: standing and walking Relieving factors: rest  PRECAUTIONS: Fall   WEIGHT BEARING RESTRICTIONS: No  FALLS: Has patient fallen in last 6 months? Yes. Number of falls 50 or 60 falls  LIVING ENVIRONMENT: Lives with: lives with their daughter Lives in: House/apartment Stairs: Yes: External: 1 steps; none Has following equipment at home: Single point cane and Walker - 2 wheeled  PLOF: Independent with basic ADLs  PATIENT GOALS: to walk and not fall  OBJECTIVE:   DIAGNOSTIC FINDINGS:   COGNITION: Overall cognitive status: Within functional limits for tasks assessed   SENSATION: Numbness and tingling right arm  POSTURE:  rounded shoulders and forward head  Cervical ROM:    Active AROM (deg) eval  Flexion Full (hurts)  Extension No pain   Right lateral flexion wfl  Left lateral flexion wfl  Right rotation   Left rotation   (Blank rows = not tested)  STRENGTH: not tested  LOWER EXTREMITY MMT:   MMT Right eval Left eval  Hip flexion    Hip abduction    Hip adduction    Hip internal rotation    Hip external rotation    Knee flexion    Knee extension    Ankle dorsiflexion    Ankle plantarflexion    Ankle inversion    Ankle eversion    (Blank rows = not tested)  BED MOBILITY:  Sit to supine Modified independence Supine to sit Modified independence Rolling to Left Modified independence  TRANSFERS: Assistive device utilized: None  Sit to stand: SBA Stand to sit: SBA Chair to chair: SBA Floor:  not tested  GAIT: Gait pattern: ataxic mild Distance walked: 50 ft in clinic Assistive device utilized: Single point cane Level of assistance: SBA Comments: occasionally staggers  FUNCTIONAL TESTS:  5 times sit to stand: 10.71 sec  PATIENT SURVEYS:  DHI 84 ( severe dizziness over 54)  VESTIBULAR ASSESSMENT:  GENERAL OBSERVATION: glasses; wear for far  away   SYMPTOM BEHAVIOR:  Subjective history: see above  Non-Vestibular symptoms: neck pain, nausea/vomiting, and migraine symptoms  Type of dizziness: Imbalance (Disequilibrium), Spinning/Vertigo, and Unsteady with head/body turns  Frequency: daily  Duration: constant with standing and walking  Aggravating factors:  standing and walking  Relieving factors: rest  Progression of symptoms: unchanged  OCULOMOTOR EXAM:  Ocular Alignment: normal  Ocular ROM: No Limitations  Spontaneous Nystagmus: absent  Gaze-Induced Nystagmus: absent  Smooth Pursuits: intact  Saccades: intact and reports some mild dizziness to the left  Convergence/Divergence:  cm   VESTIBULAR - OCULAR REFLEX:   Slow VOR: Comment: not tested  VOR Cancellation: Normal  Head-Impulse Test: HIT Right: not tested HIT Left:  not tested  Dynamic Visual Acuity:  not tested   POSITIONAL TESTING: Right Dix-Hallpike: reproduces dizziness  MOTION SENSITIVITY:  Motion Sensitivity Quotient Intensity: 0 = none, 1 = Lightheaded, 2 = Mild, 3 = Moderate, 4 = Severe, 5 = Vomiting  Intensity  1. Sitting to supine   2. Supine to L side   3. Supine to R side   4. Supine to sitting   5. L Hallpike-Dix   6. Up from L    7. R Hallpike-Dix 4  8. Up from R    9. Sitting, head tipped to L knee 3  10. Head up from L knee 3  11. Sitting, head tipped to R knee 3  12. Head up from R knee 3  13. Sitting head turns x5 0  14.Sitting head nods x5 3  15. In stance, 180 turn to L    16. In stance, 180 turn to R     OTHOSTATICS: not done  FUNCTIONAL GAIT:  SLS 0" each side   VESTIBULAR TREATMENT:                                                                                                   DATE: 11/03/22 Review of goals SLS 0" each side Dix hallpike to the right; mild 3/5 dizziness; Epley maneuver Standing: SLS x 10" Tandem stance x 10"  10/09/22 Evaluation and patient education  Canalith Repositioning:  Epley Right:  Number of Reps: 1 and Response to Treatment: symptoms improved Gaze Adaptation:   Habituation:   Other:   PATIENT EDUCATION: Education details: Patient educated on exam findings, POC, scope of PT, HEP, and what to expect next visit. Person educated: Patient Education method: Explanation, Demonstration, and Handouts Education comprehension: verbalized understanding, returned demonstration, verbal cues required, and tactile cues required  HOME EXERCISE PROGRAM:  Access Code: [facility] URL: [email or web address] Date: 11/03/2022 Prepared by: [facility] - Rehab  Exercises - Standing Single Leg Stance with Counter Support  - 2 x daily - 7 x weekly - 1 sets - 5 reps - 10 sec hold - Standing Tandem Balance with Counter Support  - 2 x daily - 7 x weekly - 1 sets - 5 reps - 10 sec hold  Access Code: [email or web address] URL: [email or web address] Date: 10/22/2022 Prepared by: [facility] - Rehab  Patient Education - What Is BPPV? - BPPV - After BPPV Repositioning  GOALS: Goals reviewed with patient? No  SHORT TERM GOALS: Target date: 11/05/2022  patient will be independent with initial HEP  Baseline: Goal status:in progress  2.  Patient will self report 50% improvement to improve tolerance for functional activity  Baseline:  Goal status:in progress  LONG TERM GOALS: Target date: 11/20/2022  Patient will be independent in self management strategies to improve quality of life and functional outcomes.  Baseline:  Goal status: in progress  2.  Patient will self report 75% improvement to improve tolerance for functional activity  Baseline:  Goal status:in progress  3.  Patient will remain free of falls Baseline:  Goal status: in progress  4.  Patient will have a negative  dix hallpike on the right Baseline:  Goal status: In progress 5.  Patient will get up out of bed without dizziness Baseline:  Goal status:in progress  ASSESSMENT:  CLINICAL  IMPRESSION: Today's session started with a review of goals; patient verbalizes agreement with set rehab goals. Tested SLS today.  Patient unable to stand on SLS without assist.  Retested right side BPPV; still dizzy but less dizzy; Epley maneuver performed for right side posterior canal BPPV.    Added balance exercise today to HEP. Patient will benefit from continued skilled therapy services during the remainder of her hospital stay and at the next recommended venue of care to address deficits and promote return to optimal function.      Patient is a 47 y.o. female who was seen today for physical therapy evaluation and treatment for dizziness. Patient demonstrates increased vestibular symptoms with provocative testing which is negatively impacting patient ability to perform ADLs and functional mobility tasks. Patient will benefit from skilled physical therapy services to address these deficits to improve level of function with ADLs, functional mobility tasks, and reduce risk for falls.    OBJECTIVE IMPAIRMENTS: Abnormal gait, decreased activity tolerance, decreased balance, decreased endurance, decreased mobility, difficulty walking, impaired perceived functional ability, and pain.   ACTIVITY LIMITATIONS: bending, standing, sleeping, transfers, and locomotion level  PARTICIPATION LIMITATIONS: meal prep, cleaning, laundry, driving, shopping, and community activity  REHAB POTENTIAL: Good  CLINICAL DECISION MAKING: Evolving/moderate complexity  EVALUATION COMPLEXITY: Moderate   PLAN:  PT FREQUENCY: 2x/week  PT DURATION: 4 weeks  PLANNED INTERVENTIONS: Therapeutic exercises, Therapeutic activity, Neuromuscular re-education, Balance training, Gait training, Patient/Family education, Joint manipulation, Joint mobilization, Stair training, Orthotic/Fit training, DME instructions, Aquatic Therapy, Dry Needling, Electrical stimulation, Spinal manipulation, Spinal mobilization, Cryotherapy, Moist  heat, Compression bandaging, scar mobilization, Splintting, Taping, Traction, Ultrasound, Ionotophoresis 4mg /ml Dexamethasone, and Manual therapy   PLAN FOR NEXT SESSION:  retest right side Gilberto Better; further tested for BPPV as indicated;  resolve BPPV and then balance   2:22 PM, 11/03/22 Hatley Henegar Small Eleasha Cataldo MPT [facility] [place] [phone] Ph:[phone]

## 2022-11-05 ENCOUNTER — Encounter (HOSPITAL_COMMUNITY): Payer: MEDICAID | Admitting: Physical Therapy

## 2022-11-06 ENCOUNTER — Other Ambulatory Visit (HOSPITAL_COMMUNITY): Payer: Self-pay | Admitting: Family Medicine

## 2022-11-06 ENCOUNTER — Inpatient Hospital Stay
Admission: RE | Admit: 2022-11-06 | Discharge: 2022-11-06 | Disposition: A | Discharge status: Home / Self Care | Payer: Self-pay | Source: Ambulatory Visit | Attending: Family Medicine | Admitting: Family Medicine

## 2022-11-06 DIAGNOSIS — Z1231 Encounter for screening mammogram for malignant neoplasm of breast: Secondary | ICD-10-CM

## 2022-11-09 ENCOUNTER — Other Ambulatory Visit: Payer: Self-pay | Admitting: Internal Medicine

## 2022-11-10 ENCOUNTER — Other Ambulatory Visit (HOSPITAL_COMMUNITY): Payer: Self-pay | Admitting: Family Medicine

## 2022-11-10 ENCOUNTER — Inpatient Hospital Stay
Admission: RE | Admit: 2022-11-10 | Discharge: 2022-11-10 | Disposition: A | Discharge status: Home / Self Care | Payer: Self-pay | Source: Ambulatory Visit | Attending: Family Medicine | Admitting: Family Medicine

## 2022-11-10 ENCOUNTER — Encounter (HOSPITAL_COMMUNITY): Payer: MEDICAID | Admitting: Physical Therapy

## 2022-11-10 DIAGNOSIS — Z1231 Encounter for screening mammogram for malignant neoplasm of breast: Secondary | ICD-10-CM

## 2022-11-11 ENCOUNTER — Inpatient Hospital Stay (HOSPITAL_COMMUNITY): Admission: RE | Admit: 2022-11-11 | Payer: MEDICAID | Source: Ambulatory Visit

## 2022-11-11 ENCOUNTER — Encounter (HOSPITAL_COMMUNITY): Payer: MEDICAID

## 2022-11-11 ENCOUNTER — Encounter (HOSPITAL_COMMUNITY): Payer: Self-pay

## 2022-11-11 LAB — NMR, LIPOPROFILE
Cholesterol, Total: 261 mg/dL — ABNORMAL HIGH (ref 100–199)
HDL Particle Number: 23.7 umol/L — ABNORMAL LOW (ref >=30.5)
HDL-C: 35 mg/dL — ABNORMAL LOW (ref >39)
LDL Particle Number: 2514 nmol/L — ABNORMAL HIGH (ref <1000)
LDL Size: 20.4 nmol — ABNORMAL LOW (ref >20.5)
LDL-C (NIH Calc): 192 mg/dL — ABNORMAL HIGH (ref 0–99)
LP-IR Score: 92 — ABNORMAL HIGH (ref <=45)
Small LDL Particle Number: 1481 nmol/L — ABNORMAL HIGH (ref <=527)
Triglycerides: 177 mg/dL — ABNORMAL HIGH (ref 0–149)

## 2022-11-11 LAB — T4, FREE: Free T4: 1 ng/dL (ref 0.82–1.77)

## 2022-11-11 LAB — COMPREHENSIVE METABOLIC PANEL
ALT: 19 [IU]/L (ref 0–32)
AST: 19 [IU]/L (ref 0–40)
Albumin: 4.1 g/dL (ref 3.9–4.9)
Alkaline Phosphatase: 122 [IU]/L — ABNORMAL HIGH (ref 44–121)
BUN/Creatinine Ratio: 14 (ref 9–23)
BUN: 11 mg/dL (ref 6–24)
Bilirubin Total: <0.2 mg/dL (ref 0.0–1.2)
CO2: 22 mmol/L (ref 20–29)
Calcium: 9.2 mg/dL (ref 8.7–10.2)
Chloride: 101 mmol/L (ref 96–106)
Creatinine, Ser: 0.8 mg/dL (ref 0.57–1.00)
Globulin, Total: 2.7 g/dL (ref 1.5–4.5)
Glucose: 112 mg/dL — ABNORMAL HIGH (ref 70–99)
Potassium: 5.1 mmol/L (ref 3.5–5.2)
Sodium: 136 mmol/L (ref 134–144)
Total Protein: 6.8 g/dL (ref 6.0–8.5)
eGFR: 90 mL/min/{1.73_m2} (ref >59)

## 2022-11-11 LAB — LIPID PANEL
Chol/HDL Ratio: 7.3 {ratio} — ABNORMAL HIGH (ref 0.0–4.4)
Cholesterol, Total: 263 mg/dL — ABNORMAL HIGH (ref 100–199)
HDL: 36 mg/dL — ABNORMAL LOW (ref >39)
LDL Chol Calc (NIH): 191 mg/dL — ABNORMAL HIGH (ref 0–99)
Triglycerides: 190 mg/dL — ABNORMAL HIGH (ref 0–149)
VLDL Cholesterol Cal: 36 mg/dL (ref 5–40)

## 2022-11-11 LAB — VITAMIN D 25 HYDROXY (VIT D DEFICIENCY, FRACTURES): Vit D, 25-Hydroxy: 12.8 ng/mL — ABNORMAL LOW (ref 30.0–100.0)

## 2022-11-11 LAB — TSH: TSH: 0.69 u[IU]/mL (ref 0.450–4.500)

## 2022-11-12 ENCOUNTER — Encounter (HOSPITAL_COMMUNITY): Payer: MEDICAID | Admitting: Physical Therapy

## 2022-11-13 ENCOUNTER — Encounter (HOSPITAL_COMMUNITY): Payer: MEDICAID

## 2022-11-13 ENCOUNTER — Telehealth (HOSPITAL_COMMUNITY): Payer: Self-pay

## 2022-11-13 NOTE — Telephone Encounter (Signed)
Called patient and spoke with her; states she got really sick and had to go home.  Is aware of her next appointment and plans to attend.    1:05 PM, 11/13/22 Cylus Douville Small Alessander Sikorski MPT [facility]  [phone]

## 2022-11-15 NOTE — Progress Notes (Unsigned)
Cardiology Office Note:  .   Date:  11/18/2022  ID:  Sandra [name], DOB Nov 03, 1975, MRN [id number] PCP: Wilmon Pali, FNP  [facility] Providers Cardiologist:  Charlton Haws, MD    Patient Profile: .      PMH Coronary artery disease Cath 05/2020 Diffuse small vessel disease (diabetic) P-mRCA 70; occluded PDA with L>R collaterals, OM2 60 Medical management Myoview 06/2020 ([facility]) No ischemia EF 34 Myoview 02/2020 No ischemia  EF 45 ? 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 Rennis Golden on 30-Aug-2020.  She reports family history of early onset heart disease in her father who also had high cholesterol.  Lipid panel at that time revealed total cholesterol 202, direct LDL 146, HDL 31, and triglyceride 168.  She was on rosuvastatin 40 mg daily and fenofibrate.  Her LDL without high potency statin thought to be well over 190 consistent with familial hyperlipidemia.  She was started on Repatha but initially had less than expected improvement in cholesterol with LDL down to 99 from 146. She demonstrated appropriate use.  Most reports that lipid panel 11/10/2022 reveals LDL particle number 2514, LDL-C 192, HDL 35, triglycerides 177, total cholesterol 261, small particle 1481.       History of Present Illness: .   Sandra [name] is a pleasant 47 y.o. female who returns for management of dyslipidemia. She reports a significant improvement in depressive symptoms after the addition of Vraylar to her current regimen of fluoxetine and bupropion. She was very  inactive prior to the addition of Vraylar. She also mentions a recent diagnosis of vertigo, which has caused some balance issues and reluctance to engage in physical activity. Wants to see a nerve specialist for numbness in both upper extremities. She expresses concern about weight gain, acknowledging a lack of healthy eating habits and physical activity. She is currently unemployed and is seeking disability. She is having difficulty with affording her medications and has been out of simvastatin, fenofibrate, and Repatha for about 5 months. She denies chest pain, shortness of breath, lower extremity edema, fatigue, palpitations, presyncope, syncope, orthopnea, and PND. Says that she feels great today.    Discussed the use of AI scribe software for clinical note transcription with the patient, who gave verbal consent to proceed.   ROS: See HPI       Studies Reviewed: .        Risk Assessment/Calculations:             Physical Exam:   VS:  BP 102/68   Pulse 70   Ht 5' (1.524 m)   Wt 193 lb 11.2 oz (87.9 kg)   SpO2 96%   BMI 37.83 kg/m    Wt Readings from Last 3 Encounters:  11/18/22 193 lb 11.2 oz (87.9 kg)  11/02/22 184 lb 9.6 oz (83.7 kg)  09/21/22 187 lb (84.8 kg)    GEN: Obese, well developed in no acute distress NECK: No JVD; No carotid bruits CARDIAC: RRR, no murmurs, rubs, gallops RESPIRATORY:  Clear to auscultation without rales, wheezing or rhonchi  ABDOMEN: Soft, non-tender, non-distended EXTREMITIES:  No edema; No deformity     ASSESSMENT  AND PLAN: .    Dyslipidemia: She has been off rosuvastatin and fenofibrate for several months due to financial constraints. We will work with Child psychotherapist to find cost-effective solutions for medication access. Consider prioritizing Rosuvastatin if cost remains an issue. Emphasized the importance of secondary prevention including heart healthy mostly plant based diet avoiding saturated fat, processed foods, simple carbohydrates, and  sugar along with aiming for at least 150 minutes of moderate intensity exercise each week.   ASCVD Risk: She is followed by Dr. Eden Emms for general cardiology.  Emphasized the importance of secondary prevention as noted above.  Offered simple exercises she can do in her home to increase physical activity.  She denies chest pain, dyspnea, or other symptoms concerning for angina.  No indication for further ischemic evaluation at this time       Dispo: 6 months with Dr. Rennis Golden or me  Signed, Eligha Bridegroom, NP-C

## 2022-11-17 ENCOUNTER — Encounter (HOSPITAL_COMMUNITY): Payer: MEDICAID

## 2022-11-18 ENCOUNTER — Telehealth: Payer: Self-pay | Admitting: Licensed Clinical Social Worker

## 2022-11-18 ENCOUNTER — Encounter (HOSPITAL_BASED_OUTPATIENT_CLINIC_OR_DEPARTMENT_OTHER): Payer: Self-pay | Admitting: Nurse Practitioner

## 2022-11-18 ENCOUNTER — Telehealth: Payer: Self-pay | Admitting: Nurse Practitioner

## 2022-11-18 ENCOUNTER — Ambulatory Visit (HOSPITAL_BASED_OUTPATIENT_CLINIC_OR_DEPARTMENT_OTHER): Payer: MEDICAID | Admitting: Nurse Practitioner

## 2022-11-18 VITALS — BP 102/68 | HR 70 | Ht 60.0 in | Wt 193.7 lb

## 2022-11-18 DIAGNOSIS — I2510 Atherosclerotic heart disease of native coronary artery without angina pectoris: Secondary | ICD-10-CM

## 2022-11-18 DIAGNOSIS — E7801 Familial hypercholesterolemia: Secondary | ICD-10-CM

## 2022-11-18 DIAGNOSIS — I420 Dilated cardiomyopathy: Secondary | ICD-10-CM | POA: Diagnosis not present

## 2022-11-18 NOTE — Telephone Encounter (Signed)
H&V Care Navigation CSW Progress Note  Clinical Social Worker contacted patient by phone to f/u regarding cost issues with medications. Pt currently enrolled in [facility] T[employer]ored Health Plan. After calling pt pharmacy and [facility] line I was updated that pt medications regardless of tier are $4, there are some that cannot be changed as they are under a Lock In program to prevent overprescribing. LCSW attempted to discuss the option of transitioning some of her medications to [facility] or local pharmacy that will accept charge account or copay waiver. Pt answered at [phone], she shares she is getting her hair dyed and unable to speak at this time. Offered to call tomorrow, she is agreeable to me calling after 10am. Will re-attempt pt tomorrow at that time.   Patient is participating in a Managed Medicaid Plan:  No, [employer] Tailored Care Plan  SDOH Screenings   Transportation Needs: No Transportation Needs (06/19/2020)   [facility], [facility]  Social Connections: Unknown (05/27/2021)   Received from [facility], [facility]  Tobacco Use: Medium Risk (11/18/2022)    Octavio Graves, MSW, LCSW Clinical Social Worker II [facility] Heart/Vascular Care Navigation  [phone]- work cell phone (preferred) [phone]- desk phone

## 2022-11-18 NOTE — Telephone Encounter (Signed)
Patient reports significant financial restraints with medications, even medications as low as $4 due to the number of medications she takes each month.  She has been out of fenofibrate, Repatha, and statin for several months. Wanted to check on best options/grants/other forms of assistance before refilling medications.

## 2022-11-18 NOTE — Patient Instructions (Signed)
Medication Instructions:   Your physician recommends that you continue on your current medications as directed. Please refer to the Current Medication list given to you today.   *If you need a refill on your cardiac medications before your next appointment, please call your pharmacy*   Lab Work:  Please get fasting labs [address] in January or February and bring your paperwork.   If you have labs (blood work) drawn today and your tests are completely normal, you will receive your results only by: MyChart Message (if you have MyChart) OR A paper copy in the mail If you have any lab test that is abnormal or we need to change your treatment, we will call you to review the results.   Testing/Procedures:  None ordered.    Follow-Up: At [facility], you and your health needs are our priority.  As part of our continuing mission to provide you with exceptional heart care, we have created designated Provider Care Teams.  These Care Teams include your primary Cardiologist (physician) and Advanced Practice Providers (APPs -  Physician Assistants and Nurse Practitioners) who all work together to provide you with the care you need, when you need it.  We recommend signing up for the patient portal called "MyChart".  Sign up information is provided on this After Visit Summary.  MyChart is used to connect with patients for Virtual Visits (Telemedicine).  Patients are able to view lab/test results, encounter notes, upcoming appointments, etc.  Non-urgent messages can be sent to your provider as well.   To learn more about what you can do with MyChart, go to [email or web address].    Your next appointment:   6 month(s)  Provider:   Eligha Bridegroom, NP   I will send you a mychart message with your appointment in May.

## 2022-11-19 ENCOUNTER — Telehealth: Payer: Self-pay | Admitting: Licensed Clinical Social Worker

## 2022-11-19 ENCOUNTER — Encounter (HOSPITAL_COMMUNITY): Payer: MEDICAID | Admitting: Physical Therapy

## 2022-11-19 NOTE — Progress Notes (Signed)
Heart and Vascular Care Navigation  11/19/2022  Sandra [name] 1975-07-08 [id number]  Reason for Referral: costs of medications Patient is participating in a Managed Medicaid Plan: No, [employer] Tailored plan  Engaged with patient by telephone for initial visit for Heart and Vascular Care Coordination.                                                                                                   Assessment:        Pt called this writer back from [phone]. Confirmed home address and PCP. Resides with her two adult daughters and one grandson- one of her daughters is expecting a baby as well. They currently reside close to her sister and sister's family. This afternoon is is upset as she just received news that her brother in law is not doing well and is concerned for how this will affect her 16y0 niece. Provided verbal support for stress this causes, and encouraged pt to call back if interested after we speak in any supportive resources for her/her family.    Pt drives herself to and from appointments. Has access to assistance through [facility] as well if needed should transportation issues arise. Pt and her daughters are all not employed. She has applied for disability, was initially denied in July but then hired a Clinical research associate last month and is working on re-applying. Pt has Section 8 voucher- pays $30/mo in rent and her former husband assists with that and paying for utilities at this time. She receives $973 in SNAP at this time for entire household. Pt stressed about costs of her medications since she has no income at this time while disability pends. LCSW discussed speaking with current [facility], regarding Medicaid waiver or a charge account. If this is not something they are willing to honor then I encouraged her to call me as there are some pharmacies able to send medications by mail and waive copays to a charge account at this time. Pt agreeable to doing so.  We discussed other assistance such  as LIE[employer] to help with energy costs; which opens for families with children on 01/13/2023. Pt also interested in [employer] for her grandson, shares she had requested it in the past isn't sure if its available now. I was able to find application and dates, she is able to submit an application now and I have sent her that link as requested to her email at [name].   No additional questions at this time, will f/u with pt.                      HRT/VAS Care Coordination     Patients Home Cardiology Office [facility]   Outpatient Care Team Social Worker   Social Worker Name: Octavio Graves LCSW [phone]   Living arrangements for the past 2 months Single Family Home   Lives with: Adult Children; Relatives; Self  2 adult daughters and 1 grandson   Patient Current Insurance Coverage Medicaid   Patient Has Concern With Paying Medical Bills No   Does Patient Have Prescription  Coverage? Yes   Home Assistive Devices/Equipment Eyeglasses   DME Agency [employer]       Social History:                                                                             SDOH Screenings   Food Insecurity: Food Insecurity Present (11/19/2022)  Housing: Low Risk  (11/19/2022)  Transportation Needs: No Transportation Needs (11/19/2022)  Utilities: Not At Risk (11/19/2022)  Financial Resource Strain: High Risk (11/19/2022)  Social Connections: Unknown (05/27/2021)   [facility] from [facility], [facility]  Tobacco Use: Medium Risk (11/18/2022)  Health Literacy: Adequate Health Literacy (11/19/2022)    SDOH Interventions: Financial Resources:  Financial Strain Interventions: Other (Comment) (unemployed, disability pending- pt receives family support,will mail community resources ([place], Data processing manager for [employer], Archivist), discussed calling pharmacy regarding copay waivers for medications)  Food Insecurity:  Food Insecurity Interventions: Other (Comment) (pt receives [employer], will mail any  additional food resources i can find for [employer])  Housing Insecurity:  Housing Interventions: Intervention Not Indicated (currently recieves Section 8; pays $30/mo)  Transportation:   Transportation Interventions: Intervention Not Indicated, Patient Resources (Friends/Family), Payor Benefit    Other Care Navigation Interventions:     Provided Pharmacy assistance resources  LCSW spoke with pt about calling her pharmacy regarding copay waivers/charge accounts. Pt will need to let us know if current pharmacy not willing to honor this and we can look into alternate pharmacies   Follow-up plan:   LCSW emailed pt the information about [employer] to [name]. Physically mailed her information regarding [facility] food/utility/clothing assistance program and [address] community assistance packet from [employer]. Will f/u to see if she has contacted pharmacy about waiver and continue to encourage her to do so or assist with transitioning pharmacies as able.

## 2022-11-19 NOTE — Telephone Encounter (Signed)
H&V Care Navigation CSW Progress Note  Clinical Social Worker contacted patient by phone to f/u again on concerns with medication costs. Called after 10am as requested. Pt answered at [phone]. Shared she is at the store. I requested she give me a call back when she is back home and able to speak.   Patient is participating in a Managed Medicaid Plan:  No, [employer] Tailored Care Plan  SDOH Screenings   Transportation Needs: No Transportation Needs (06/19/2020)   Received [facility], [facility]  Social Connections: Unknown (05/27/2021)   Received from [facility], [facility]  Tobacco Use: Medium Risk (11/18/2022)    Octavio Graves, MSW, LCSW Clinical Social Worker II [facility] Heart/Vascular Care Navigation  [phone]- work cell phone (preferred) [phone]- desk phone

## 2022-11-20 ENCOUNTER — Encounter (HOSPITAL_COMMUNITY): Payer: MEDICAID

## 2022-11-26 ENCOUNTER — Telehealth: Payer: Self-pay | Admitting: Licensed Clinical Social Worker

## 2022-11-26 NOTE — Telephone Encounter (Signed)
H&V Care Navigation CSW Progress Note  Clinical Social Worker contacted patient by phone to f/u on community assistance resources sent to pt. No answer today at [phone]. Left voicemail requesting return call, will re-attempt again as able.   Patient is participating in a Managed Medicaid Plan:  No, [employer] Tailored Care Plan  SDOH Screenings   Food Insecurity: Food Insecurity Present (11/19/2022)  Housing: Low Risk  (11/19/2022)  Transportation Needs: No Transportation Needs (11/19/2022)  Utilities: Not At Risk (11/19/2022)  Financial Resource Strain: High Risk (11/19/2022)  Social Connections: Unknown (05/27/2021)   Received from [facility], [facility]  Tobacco Use: Medium Risk (11/18/2022)  Health Literacy: Adequate Health Literacy (11/19/2022)    Octavio Graves, MSW, LCSW Clinical Social Worker II [facility] Heart/Vascular Care Navigation  [phone]- work cell phone (preferred) [phone]- desk phone

## 2022-11-30 ENCOUNTER — Telehealth: Payer: Self-pay | Admitting: Licensed Clinical Social Worker

## 2022-11-30 NOTE — Telephone Encounter (Signed)
H&V Care Navigation CSW Progress Note  Clinical Social Worker contacted patient by phone to f/u on community assistance resources sent to pt. No answer today at [phone]. Left 2nd voicemail requesting return call, will re-attempt again as able.    Patient is participating in a Managed Medicaid Plan:  No, [employer] Tailored Care Plan  SDOH Screenings   Food Insecurity: Food Insecurity Present (11/19/2022)  Housing: Low Risk  (11/19/2022)  Transportation Needs: No Transportation Needs (11/19/2022)  Utilities: Not At Risk (11/19/2022)  Financial Resource Strain: High Risk (11/19/2022)  Social Connections: Unknown (05/27/2021)   Received from [facility], [facility]  Tobacco Use: Medium Risk (11/18/2022)  Health Literacy: Adequate Health Literacy (11/19/2022)   Octavio Graves, MSW, LCSW Clinical Social Worker II [facility] Heart/Vascular Care Navigation  [phone]- work cell phone (preferred) [phone]- desk phone

## 2022-12-04 ENCOUNTER — Telehealth (HOSPITAL_BASED_OUTPATIENT_CLINIC_OR_DEPARTMENT_OTHER): Payer: Self-pay | Admitting: Licensed Clinical Social Worker

## 2022-12-04 NOTE — Telephone Encounter (Signed)
H&V Care Navigation CSW Progress Note  Clinical Social Worker contacted patient by phone to f/u on community assistance resources sent to pt. No answer today at [phone]. Left 3rd voicemail requesting return call, remain available should pt return call/re-engage with care navigation team.    Patient is participating in a Managed Medicaid Plan:  No, [employer] Tailored Care Plan  SDOH Screenings   Food Insecurity: Food Insecurity Present (11/19/2022)  Housing: Low Risk  (11/19/2022)  Transportation Needs: No Transportation Needs (11/19/2022)  Utilities: Not At Risk (11/19/2022)  Financial Resource Strain: High Risk (11/19/2022)  Social Connections: Unknown (05/27/2021)   Received from [facility], [facility]  Tobacco Use: Medium Risk (11/18/2022)  Health Literacy: Adequate Health Literacy (11/19/2022)    Octavio Graves, MSW, LCSW Clinical Social Worker II [facility] Heart/Vascular Care Navigation  [phone]- work cell phone (preferred) [phone]- desk phone

## 2022-12-17 ENCOUNTER — Other Ambulatory Visit: Payer: Self-pay | Admitting: Nurse Practitioner

## 2022-12-17 DIAGNOSIS — E1165 Type 2 diabetes mellitus with hyperglycemia: Secondary | ICD-10-CM

## 2022-12-18 ENCOUNTER — Encounter: Payer: Self-pay | Admitting: Nurse Practitioner

## 2022-12-18 ENCOUNTER — Ambulatory Visit (INDEPENDENT_AMBULATORY_CARE_PROVIDER_SITE_OTHER): Payer: MEDICAID | Admitting: Nurse Practitioner

## 2022-12-18 VITALS — BP 131/84 | HR 83 | Ht 60.0 in | Wt 190.8 lb

## 2022-12-18 DIAGNOSIS — Z794 Long term (current) use of insulin: Secondary | ICD-10-CM

## 2022-12-18 DIAGNOSIS — E782 Mixed hyperlipidemia: Secondary | ICD-10-CM

## 2022-12-18 DIAGNOSIS — I10 Essential (primary) hypertension: Secondary | ICD-10-CM | POA: Diagnosis not present

## 2022-12-18 DIAGNOSIS — E1165 Type 2 diabetes mellitus with hyperglycemia: Secondary | ICD-10-CM | POA: Diagnosis not present

## 2022-12-18 LAB — POCT GLYCOSYLATED HEMOGLOBIN (HGB A1C): Hemoglobin A1C: 7.4 % — AB (ref 4.0–5.6)

## 2022-12-18 MED ORDER — DEXCOM G7 SENSOR MISC: 1.0000 | 3 refills | Status: DC

## 2022-12-18 MED ORDER — INSULIN ASPART 100 UNIT/ML IJ SOLN: INTRAMUSCULAR | 6 refills | Status: DC

## 2022-12-18 MED ORDER — OMNIPOD DASH PODS (GEN 4) MISC: 3 refills | Status: DC

## 2022-12-18 NOTE — Progress Notes (Signed)
12/18/2022, 10:20 AM                   Endocrinology follow-up note   Subjective:    Patient ID: Sandra [name], female    DOB: 11/06/1975.  Sandra [name] is being seen in follow-up  for management of currently uncontrolled symptomatic type 2  diabetes requested by  Wilmon Pali, FNP.   Past Medical History:  Diagnosis Date   ADHD (attention deficit hyperactivity disorder)    Allergic rhinitis    Anemia    hx of   Anxiety    Arthritis    Barrett's esophagus    Bipolar 1 disorder (HCC)    Bladder tumor    CAD (coronary artery disease)    Cancer (HCC)    bladder   Constipation    Depression    Dilated cardiomyopathy (HCC) 12/20/2020   Echo 6/22 ([facility]): EF 43, mild MR, mild TR // Echo 3/22: EF 50-55   Dizziness    Full dentures    GERD (gastroesophageal reflux disease)    Gross hematuria    Hyperlipidemia    Hypertension    Stable angina (HCC)    Type 2 diabetes mellitus (HCC)    Urgency of urination    dysuria, sui   Wears glasses     Past Surgical History:  Procedure Laterality Date   ABDOMINAL HYSTERECTOMY     ANTERIOR CERVICAL DECOMP/DISCECTOMY FUSION N/A 03/05/2022   Procedure: ANTERIOR CERVICAL DECOMPRESSION/DISCECTOMY FUSION 1 LEVEL; C6-C7;  Surgeon: Venita Lick, MD;  Location: [facility];  Service: Orthopedics;  Laterality: N/A;  3 hrs 3 C-Bed   BALLOON DILATION N/A 02/17/2021   Procedure: BALLOON DILATION;  Surgeon: Lanelle Bal, DO;  [facility]: [facility];  Service: Endoscopy;  Laterality: N/A;   BIOPSY  02/17/2021   Procedure: BIOPSY;  Surgeon: Lanelle Bal, DO;  [facility];  Service: Endoscopy;;   COLONOSCOPY WITH PROPOFOL N/A 02/17/2021   Surgeon: Lanelle Bal, DO; nonbleeding internal hemorrhoids, otherwise normal exam.  Repeat in 10 years.   CYSTOSCOPY N/A 10/03/2015   Procedure: CYSTOSCOPY;  Surgeon: Bjorn Pippin, MD;  Location: [facility];  Service: Urology;  Laterality: N/A;   CYSTOSCOPY W/ URETERAL STENT PLACEMENT Right 07/24/2021   Procedure: CYSTOSCOPY WITH RETROGRADE PYELOGRAM/URETERAL STENT PLACEMENT;  Surgeon: Malen Gauze, MD;  Location: [facility];  Service: Urology;  Laterality: Right;   CYSTOSCOPY WITH RETROGRADE PYELOGRAM, URETEROSCOPY AND STENT PLACEMENT Right 08/14/2021   Procedure: CYSTOSCOPY WITH RETROGRADE PYELOGRAM, URETEROSCOPY AND STENT EXCHANGE;  Surgeon: Malen Gauze, MD;  Location: [facility];  Service: Urology;  Laterality: Right;   CYSTOSCOPY WITH STENT PLACEMENT Right 10/31/2015   Procedure: CYSTOSCOPY WITH ATTEMPTED RIGHT URETERAL OPENING;  Surgeon: Bjorn Pippin, MD;  Location: [facility];  Service: Urology;  Laterality: Right;   ESOPHAGOGASTRODUODENOSCOPY (EGD) WITH PROPOFOL N/A 02/17/2021   Surgeon: Lanelle Bal, DO; 2 cm hiatal hernia, Barrett's esophagus, gastritis with biopsies negative for H. pylori.  S/p empiric esophageal dilation.  Repeat in 3 years.   FOOT SURGERY Left    September 2022, broke 7 bones in left foot  INTERSTIM IMPLANT PLACEMENT N/A 03/14/2018   Procedure: Leane Platt IMPLANT FIRST STAGE;  Surgeon: Malen Gauze, MD;  Location: [facility];  Service: Urology;  Laterality: N/A;   INTERSTIM IMPLANT PLACEMENT N/A 03/30/2018   Procedure: Leane Platt IMPLANT SECOND STAGE;  Surgeon: Malen Gauze, MD;  Location: [facility];  Service: Urology;  Laterality: N/A;   INTERSTIM IMPLANT REVISION N/A 12/01/2021   Procedure: REVISION OF Leane Platt- generator anf lead replacement;  Surgeon: Malen Gauze, MD;  Location: [facility];  Service: Urology;  Laterality: N/A;   LEFT HEART CATH AND CORONARY ANGIOGRAPHY N/A 05/31/2020   Procedure: LEFT HEART CATH AND CORONARY ANGIOGRAPHY;  Surgeon: Lennette Bihari, MD;  [facility]: [facility];  Service: Cardiovascular;  Laterality: N/A;   MULTIPLE TOOTH EXTRACTIONS  2015   OVARIAN CYST REMOVAL Right 2004  approx   SHOULDER OPEN ROTATOR CUFF REPAIR Left 04/20/2019   Procedure: ROTATOR CUFF REPAIR SHOULDER OPEN;  Surgeon: Vickki Hearing, MD;  Location: [facility];  Service: Orthopedics;  Laterality: Left;   TONSILLECTOMY  12/30/2004   TOTAL ABDOMINAL HYSTERECTOMY W/ BILATERAL SALPINGOOPHORECTOMY  05/16/2004   TRANSURETHRAL RESECTION OF BLADDER TUMOR N/A 10/03/2015   Procedure: TRANSURETHRAL RESECTION OF BLADDER TUMOR (TURBT);  Surgeon: Bjorn Pippin, MD;  Location: [facility];  Service: Urology;  Laterality: N/A;   TRANSURETHRAL RESECTION OF BLADDER TUMOR N/A 10/31/2015   Procedure: RE-STAGINGG TRANSURETHRAL RESECTION OF BLADDER TUMOR (TURBT);  Surgeon: Bjorn Pippin, MD;  Location: [facility];  Service: Urology;  Laterality: N/A;   TRANSURETHRAL RESECTION OF BLADDER TUMOR N/A 08/17/2016   Procedure: TRANSURETHRAL RESECTION OF BLADDER TUMOR (TURBT);  Surgeon: Malen Gauze, MD;  [facility];  Service: Urology;  Laterality: N/A;   URETERAL BIOPSY Right 08/14/2021   Procedure: URETERAL BIOPSY;  Surgeon: Malen Gauze, MD;  Location: [facility];  Service: Urology;  Laterality: Right;    Social History   Socioeconomic History   Marital status: Married    Spouse name: Not on file   Number of children: Not on file   Years of education: Not on file   Highest education level: Not on file  Occupational History   Not on file  Tobacco Use   Smoking status: Former    Current packs/day: 0.00    Average packs/day: 0.5 packs/day for 10.0 years (5.0 ttl pk-yrs)    Types: Cigarettes    Start date: 02/01/2011    Quit date: 01/31/2021    Years since quitting: 1.8   Smokeless tobacco: Never   Tobacco comments:    02/06/21-no smoking past 5 days  Vaping Use   Vaping status: Never Used  Substance and Sexual Activity   Alcohol use: No   Drug use: No   Sexual activity: Not Currently    Birth control/protection: Surgical    Comment: hyst  Other Topics Concern    Not on file  Social History Narrative   Are you right handed or left handed? Left Handed    Are you currently employed ? No    What is your current occupation?   Do you live at home alone? No    Who lives with you? Lives with daughter    What type of home do you live in: 1 story or 2 story? Lives in a trailer        Social Determinants of Health   Financial Resource Strain: High Risk (11/19/2022)   Overall Financial Resource Strain (CARDIA)    Difficulty of Paying  Living Expenses: Hard  Food Insecurity: Food Insecurity Present (11/19/2022)   Hunger Vital Sign    Worried About Running Out of Food in the Last Year: Sometimes true    Ran Out of Food in the Last Year: Never true  Transportation Needs: No Transportation Needs (11/19/2022)   PRAPARE - Administrator, Civil Service (Medical): No    Lack of Transportation (Non-Medical): No  Physical Activity: Not on file  Stress: Not on file  Social Connections: Unknown (05/27/2021)   [facility]   Social Network    Social Network: Not on file    Family History  Problem Relation Age of Onset   Hypertension Mother    Cancer Mother    Breast cancer Maternal Aunt    Lung cancer Maternal Uncle    Cancer Other    Diabetes Daughter    Colon cancer Neg Hx     Outpatient Encounter Medications as of 12/18/2022  Medication Sig   ACCU-CHEK GUIDE test strip USE AS INSTRUCTED TO MONITOR GLUCOSE 4 TIMES DAILY BEFORE MEALS AND BEFORE BED.   Accu-Chek Softclix Lancets lancets Use as instructed to monitor glucose 4 times daily   albuterol (PROAIR HFA) 108 (90 Base) MCG/ACT inhaler Inhale 2 puffs into the lungs every 6 (six) hours as needed for wheezing or shortness of breath.   amLODipine (NORVASC) 5 MG tablet Take 2 tablets (10 mg total) by mouth daily.   aspirin EC 81 MG tablet Take 1 tablet (81 mg total) by mouth daily with breakfast. Swallow whole.   benzonatate (TESSALON) 100 MG capsule Take 1 capsule  (100 mg total) by mouth every 8 (eight) hours.   Blood Glucose Monitoring Suppl (ACCU-CHEK GUIDE) w/Device KIT 1 Piece by Does not apply route as directed.   Blood Pressure Monitoring (BLOOD PRESSURE CUFF) MISC See admin instructions.   buPROPion (WELLBUTRIN XL) 300 MG 24 hr tablet Take 300 mg by mouth every morning.   cloNIDine HCl (KAPVAY) 0.1 MG TB12 ER tablet Take 0.1 mg by mouth at bedtime.   Continuous Glucose Sensor (DEXCOM G7 SENSOR) MISC Inject 1 Application into the skin as directed. Change sensor every 10 days as directed.   cyclobenzaprine (FLEXERIL) 10 MG tablet Take 1 tablet (10 mg total) by mouth 3 (three) times daily as needed for muscle spasms.   Evolocumab (REPATHA SURECLICK) 140 MG/ML SOAJ INJECT 1 DOSE INTO THE SKIN EVERY 14 DAYS.   fenofibrate (TRICOR) 145 MG tablet TAKE 1 TABLET ONCE DAILY.   FLUoxetine (PROZAC) 40 MG capsule Take 40 mg by mouth in the morning.   fluticasone (FLONASE) 50 MCG/ACT nasal spray Place 2 sprays into both nostrils daily.   gabapentin (NEURONTIN) 600 MG tablet Take 600 mg by mouth 3 (three) times daily.   GLOBAL EASE INJECT PEN NEEDLES 31G X 5 MM MISC Inject into the skin.   HYDROcodone-acetaminophen (NORCO) 10-325 MG tablet Take 1 tablet by mouth. One tablet four times a day   Insulin Disposable Pump (OMNIPOD DASH PDM, GEN 4,) KIT Change pod every 48-72 hours   Insulin Pen Needle (PEN NEEDLES) 31G X 6 MM MISC Use to inject insulin 4 times daily   INSULIN SYRINGE 1CC/29G 29G X 1/2" 1 ML MISC Use to inject insulin into Omnipod   isosorbide mononitrate (IMDUR) 30 MG 24 hr tablet Take 1 tablet (30 mg total) by mouth daily.   meclizine (ANTIVERT) 25 MG tablet Take 25 mg by mouth 3 (three) times daily as  needed.   metoprolol succinate (TOPROL-XL) 50 MG 24 hr tablet Take 1 tablet (50 mg total) by mouth daily. Take with or immediately following a meal.   nitroGLYCERIN (NITROSTAT) 0.4 MG SL tablet PLACE 1 TAB UNDER TONGUE EVERY 3 MINUTES AS DIRECTED UP TO 3  TIMES AS NEEDED FOR CHEST PAIN. (Patient taking differently: Place 0.4 mg under the tongue every 5 (five) minutes as needed for chest pain.)   pantoprazole (PROTONIX) 40 MG tablet take 1 tablet twice daily before a meal   rosuvastatin (CRESTOR) 40 MG tablet Take 1 tablet (40 mg total) by mouth daily.   tamsulosin (FLOMAX) 0.4 MG CAPS capsule TAKE 1 CAPSULE BY MOUTH AT BEDTIME.   VRAYLAR 1.5 MG capsule Take 1.5 mg by mouth daily.   VYVANSE 50 MG capsule Take 50 mg by mouth daily.   [DISCONTINUED] Continuous Blood Gluc Transmit (DEXCOM G6 TRANSMITTER) MISC 1 Device by Does not apply route every 3 (three) months.   [DISCONTINUED] Continuous Glucose Sensor (DEXCOM G6 SENSOR) MISC apply new sensor every 10 days as directed.   [DISCONTINUED] insulin aspart (NOVOLOG) 100 UNIT/ML injection Use with Omnipod for TDD around 150 units daily   [DISCONTINUED] Insulin Aspart FlexPen (NOVOLOG) 100 UNIT/ML INJECT 8-14 UNITS SUB-Q 3 TIMES A DAY WITH MEALS.   [DISCONTINUED] Insulin Disposable Pump (OMNIPOD DASH PODS, GEN 4,) MISC change pod every 48 hours as directed.   FLUoxetine (PROZAC) 20 MG capsule Take 20 mg by mouth at bedtime. (Patient not taking: Reported on 11/18/2022)   insulin aspart (NOVOLOG) 100 UNIT/ML injection Use with Omnipod for TDD around 150 units daily   Insulin Disposable Pump (OMNIPOD DASH PODS, GEN 4,) MISC change pod every 48 hours as directed.   [DISCONTINUED] Continuous Glucose Sensor (DEXCOM G6 SENSOR) MISC apply new sensor every 10 days as directed.   No facility-administered encounter medications on file as of 12/18/2022.    ALLERGIES: Allergies  Allergen Reactions   Ibuprofen Hives and Itching   Benazepril Hives   Diphenhydramine Hives   Latex Itching and Dermatitis    States she had redness to skin and itching    Metronidazole Nausea And Vomiting   Oxycodone     Pt has tolerated hydromorphone in the past and takes Norco at home.   Toradol [Ketorolac Tromethamine] Itching    Zofran [Ondansetron Hcl] Hives   Adhesive [Tape] Rash   Codeine Hives, Nausea And Vomiting and Rash   Loratadine Rash   Nystatin-Triamcinolone Rash   Penicillins Rash    Has patient had a PCN reaction causing immediate rash, facial/tongue/throat swelling, SOB or lightheadedness with hypotension: No Has patient had a PCN reaction causing severe rash involving mucus membranes or skin necrosis: Yes Has patient had a PCN reaction that required hospitalization: No Has patient had a PCN reaction occurring within the last 10 years: no  If all of the above answers are "NO", then may proceed with Cephalosporin use. Other reaction(s): nausea Received ancef w/o issue (7/23)    VACCINATION STATUS: Immunization History  Administered Date(s) Administered   Influenza,inj,Quad PF,6+ Mos 11/01/2015    Diabetes She presents for her follow-up diabetic visit. She has type 2 diabetes mellitus. Onset time: She was diagnosed at approximate age of 55 years with A1c of greater than 15% Her disease course has been stable. There are no hypoglycemic associated symptoms. Pertinent negatives for hypoglycemia include no confusion, headaches, pallor or seizures. Associated symptoms include foot paresthesias. Pertinent negatives for diabetes include no blurred vision, no chest pain, no  fatigue, no polydipsia, no polyphagia, no polyuria and no weight loss. There are no hypoglycemic complications. Symptoms are stable. Diabetic complications include peripheral neuropathy. Risk factors for coronary artery disease include dyslipidemia, diabetes mellitus, hypertension, sedentary lifestyle and tobacco exposure. Current diabetic treatment includes insulin pump. She is compliant with treatment most of the time. Her weight is fluctuating minimally. She is following a generally unhealthy diet. When asked about meal planning, she reported none. She has not had a previous visit with a dietitian. She rarely participates in exercise. Her  home blood glucose trend is fluctuating minimally. Her overall blood glucose range is 140-180 mg/dl. (She presents today with her CGM and Omnipod DASH showing slightly above target glycemic profile.  Her POCT A1c today is 7.4%, increasing slightly from last visit of 7.1%.  Analysis of her CGM shows TIR 61%, TAR 38%, TBR <2% with a GMI of 7.3%.  ) An ACE inhibitor/angiotensin II receptor blocker is not being taken. She does not see a podiatrist.Eye exam is current.  Hyperlipidemia This is a chronic problem. The current episode started more than 1 year ago. The problem is uncontrolled. Recent lipid tests were reviewed and are high. Exacerbating diseases include diabetes. Factors aggravating her hyperlipidemia include beta blockers and fatty foods. Pertinent negatives include no chest pain, myalgias or shortness of breath. Current antihyperlipidemic treatment includes fibric acid derivatives and statins. The current treatment provides mild improvement of lipids. Compliance problems include adherence to diet and adherence to exercise.  Risk factors for coronary artery disease include diabetes mellitus, dyslipidemia, a sedentary lifestyle, family history and hypertension.  Hypertension This is a chronic problem. The current episode started more than 1 year ago. The problem is uncontrolled. Pertinent negatives include no blurred vision, chest pain, headaches, palpitations or shortness of breath. There are no associated agents to hypertension. Risk factors for coronary artery disease include dyslipidemia, diabetes mellitus and sedentary lifestyle. Past treatments include calcium channel blockers and beta blockers. The current treatment provides mild improvement. Compliance problems include diet and exercise.  Hypertensive end-organ damage includes CAD/MI.    Review of systems  Constitutional: + stable body weight,  current Body mass index is 37.26 kg/m. , no fatigue, no subjective hyperthermia, no subjective  hypothermia Eyes: no blurry vision, no xerophthalmia ENT: no sore throat, no nodules palpated in throat, no dysphagia/odynophagia, no hoarseness Cardiovascular: no chest pain, no shortness of breath, no palpitations, no leg swelling Respiratory: no cough, no shortness of breath Musculoskeletal: no muscle/joint aches Skin: no rashes, no hyperemia Neurological: no tremors, + numbness/tingling to BLE, no dizziness Psychiatric: no depression, no anxiety   Objective:    BP 131/84 (BP Location: Right Arm, Patient Position: Sitting, Cuff Size: Large)   Pulse 83   Ht 5' (1.524 m)   Wt 190 lb 12.8 oz (86.5 kg)   BMI 37.26 kg/m   Wt Readings from Last 3 Encounters:  12/18/22 190 lb 12.8 oz (86.5 kg)  11/18/22 193 lb 11.2 oz (87.9 kg)  11/02/22 184 lb 9.6 oz (83.7 kg)    BP Readings from Last 3 Encounters:  12/18/22 131/84  11/18/22 102/68  11/02/22 136/84     Physical Exam- Limited  Constitutional:  Body mass index is 37.26 kg/m. , not in acute distress, normal state of mind Eyes:  EOMI, no exophthalmos Musculoskeletal: no gross deformities, strength intact in all four extremities, no gross restriction of joint movements Skin:  no rashes, no hyperemia Neurological: no tremor with outstretched hands   Diabetic Foot Exam -  Simple   No data filed    CMP ( most recent) CMP     Component Value Date/Time   NA 136 11/10/2022 1135   K 5.1 11/10/2022 1135   CL 101 11/10/2022 1135   CO2 22 11/10/2022 1135   GLUCOSE 112 (H) 11/10/2022 1135   GLUCOSE 183 (H) 04/23/2022 2054   BUN 11 11/10/2022 1135   CREATININE 0.80 11/10/2022 1135   CALCIUM 9.2 11/10/2022 1135   PROT 6.8 11/10/2022 1135   ALBUMIN 4.1 11/10/2022 1135   AST 19 11/10/2022 1135   ALT 19 11/10/2022 1135   ALKPHOS 122 (H) 11/10/2022 1135   BILITOT <0.2 11/10/2022 1135   GFRNONAA >60 04/23/2022 2054   GFRAA >60 04/17/2019 1427     Diabetic Labs (most recent): Lab Results  Component Value Date   HGBA1C 7.4  (A) 12/18/2022   HGBA1C 7.1 (A) 09/15/2022   HGBA1C 7.8 (A) 06/15/2022   MICROALBUR 80 06/24/2021   MICROALBUR 80 07/10/2020      Assessment & Plan:   1) Uncontrolled type 2 diabetes mellitus with hyperglycemia (HCC)  - Sandra [name] has currently uncontrolled symptomatic type 2 DM since  47 years of age.  She presents today with her CGM and Omnipod DASH showing slightly above target glycemic profile.  Her POCT A1c today is 7.4%, increasing slightly from last visit of 7.1%.  Analysis of her CGM shows TIR 61%, TAR 38%, TBR <2% with a GMI of 7.3%.   - I had a long discussion with her about the progressive nature of diabetes and the pathology behind its complications. -her diabetes is complicated by chronic smoking and she remains at a high risk for more acute and chronic complications which include CAD, CVA, CKD, retinopathy, and neuropathy. These are all discussed in detail with her.  - Nutritional counseling repeated at each appointment due to patients tendency to fall back in to old habits.  - The patient admits there is a room for improvement in their diet and drink choices. -  Suggestion is made for the patient to avoid simple carbohydrates from their diet including Cakes, Sweet Desserts / Pastries, Ice Cream, Soda (diet and regular), Sweet Tea, Candies, Chips, Cookies, Sweet Pastries, Store Bought Juices, Alcohol in Excess of 1-2 drinks a day, Artificial Sweeteners, Coffee Creamer, and "Sugar-free" Products. This will help patient to have stable blood glucose profile and potentially avoid unintended weight gain.   - I encouraged the patient to switch to unprocessed or minimally processed complex starch and increased protein intake (animal or plant source), fruits, and vegetables.   - Patient is advised to stick to a routine mealtimes to eat 3 meals a day and avoid unnecessary snacks (to snack only to correct hypoglycemia).  - I have approached her with the following individualized  plan to manage  her diabetes and patient agrees:   -No changes will be made to her insulin pump settings today.  Will reach out to [facility] trainer to inquire about inconsistencies with her [place] report.  -She is encouraged to continue monitoring blood glucose 4 times daily (using her CGM), before meals and at bedtime, and to call the clinic if she has readings less than 70 or greater than 300 for 3 readings in a row.  I did send in for upgrade to the [facility] G7 for her per her request.  - Specific targets for  A1c;  LDL, HDL, Triglycerides, were discussed with the patient.  2) Blood Pressure /Hypertension: Her blood pressure  is not controlled to target. It does not appear she is on any antihypertensive medications.  She is allergic to ACE inhibitors. She has a history of being a chronic heavy smoker, quit in 2021.  Marland Kitchen   3) Lipids/Hyperlipidemia:  Her most recent lipid panel from 11/10/22 shows uncontrolled LDL of 191 (worsening) and elevated triglycerides of 180 (improved).  She is advised to continue her Crestor 40 mg po daily at bedtime, Repatha 140 mg po every 14 days, and Tricor 145 mg po daily.  Side effects and precautions discussed with her.    4)  Weight/Diet:  Her Body mass index is 37.26 kg/m.-clearly complicating her diabetes care.  She is a candidate for modest weight loss.  I discussed with her the fact that loss of 5 - 10% of her  current body weight will have the most impact on her diabetes management.  Exercise, and detailed carbohydrates information provided  -  detailed on discharge instructions.  5) Chronic Care/Health Maintenance: -she is not on ACEI/ARB and is on Statin medications and is encouraged to initiate and continue to follow up with Ophthalmology, Dentist,  Podiatrist at least yearly or according to recommendations, and advised to stay away from smoking. I have recommended yearly flu vaccine and pneumonia vaccine at least every 5 years; moderate intensity exercise for  up to 150 minutes weekly; and  sleep for at least 7 hours a day.  - she is advised to maintain close follow up with Wilmon Pali, FNP for primary care needs, as well as her other providers for optimal and coordinated care.      I spent  41  minutes in the care of the patient today including review of labs from CMP, Lipids, Thyroid Function, Hematology (current and previous including abstractions from other facilities); face-to-face time discussing  her blood glucose readings/logs, discussing hypoglycemia and hyperglycemia episodes and symptoms, medications doses, her options of short and long term treatment based on the latest standards of care / guidelines;  discussion about incorporating lifestyle medicine;  and documenting the encounter. Risk reduction counseling performed per USPSTF guidelines to reduce obesity and cardiovascular risk factors.     Please refer to Patient Instructions for Blood Glucose Monitoring and Insulin/Medications Dosing Guide"  in media tab for additional information. Please  also refer to " Patient Self Inventory" in the Media  tab for reviewed elements of pertinent patient history.  Sandra [name] participated in the discussions, expressed understanding, and voiced agreement with the above plans.  All questions were answered to her satisfaction. she is encouraged to contact clinic should she have any questions or concerns prior to her return visit.   Follow up plan: - Return in about 4 months (around 04/18/2023) for Diabetes F/U with A1c in office, No previsit labs, Bring meter and logs.   Ronny Bacon, [facility] [address] [place], [place] [address] Phone: [phone] Fax: [phone]  12/18/2022, 10:20 AM

## 2023-01-07 ENCOUNTER — Telehealth: Payer: Self-pay | Admitting: Pharmacy Technician

## 2023-01-07 ENCOUNTER — Other Ambulatory Visit (HOSPITAL_COMMUNITY): Payer: Self-pay

## 2023-01-07 NOTE — Telephone Encounter (Signed)
Pharmacy Patient Advocate Encounter   Received notification from CoverMyMeds that prior authorization for repatha is required/requested.   Insurance verification completed.   The patient is insured through [employer] .   Per test claim: PA required; PA submitted to above mentioned insurance via CoverMyMeds Key/confirmation #/EOC [id number] Status is pending

## 2023-01-08 NOTE — Telephone Encounter (Signed)
Pharmacy Patient Advocate Encounter  Received notification from [facility] that Prior Authorization for repatha has been APPROVED from 01/08/23 to 01/07/24   PA #/Case ID/Reference #: [id number]

## 2023-01-21 ENCOUNTER — Telehealth: Payer: Self-pay

## 2023-01-21 DIAGNOSIS — M7742 Metatarsalgia, left foot: Secondary | ICD-10-CM | POA: Insufficient documentation

## 2023-01-21 NOTE — Telephone Encounter (Signed)
   Pre-operative Risk Assessment    Patient Name: Sandra [name]  DOB: 15-Oct-1975 MRN: [id number]   Date of last office visit: 11/18/22 Percy, NP  Date of next office visit: n/a   Request for Surgical Clearance    Procedure:   Left 2nd met head resection vs well osteotomy   Date of Surgery:  Clearance TBD                                 Surgeon:  Dr. Lillia Mountain  Surgeon's Group or Practice Name:  [facility]  Phone number:  [phone] Fax number:  [phone]   Type of Clearance Requested:   - Medical  - Pharmacy:  Hold Aspirin  Not indicated    Type of Anesthesia:  Not Indicated   Additional requests/questions:    [name]   01/21/2023, 2:13 PM

## 2023-01-22 NOTE — Telephone Encounter (Signed)
   Name: Sandra [name]  DOB: [birth year]  MRN: [id number]  Primary Cardiologist: Maude Emmer, MD  Chart reviewed as part of pre-operative protocol coverage. Because of Sandra [name]'s past medical history and time since last visit, she will require a follow-up telephone visit in order to better assess preoperative cardiovascular risk.  Pre-op covering staff: - Please schedule appointment and call patient to inform them. If patient already had an upcoming appointment within acceptable timeframe, please add pre-op clearance to the appointment notes so provider is aware. - Please contact requesting surgeon's office via preferred method (i.e, phone, fax) to inform them of need for appointment prior to surgery.  If no symptoms at the time of phone call, she can hold ASA x 5-7 days prior to procedure and resume when medically safe to do so.   Orren LOISE Fabry, PA-C  01/22/2023, 10:38 AM

## 2023-01-22 NOTE — Telephone Encounter (Signed)
 Left message to call back to schedule tele pre op appt.

## 2023-01-25 NOTE — Telephone Encounter (Signed)
 Patient is returning call. Please advise?

## 2023-01-26 ENCOUNTER — Telehealth: Payer: Self-pay

## 2023-01-26 NOTE — Telephone Encounter (Signed)
 Patient is scheduled for telephone visit consent and med rec done     Patient Consent for Virtual Visit         Sandra [name] has provided verbal consent on 01/26/2023 for a virtual visit (video or telephone).   CONSENT FOR VIRTUAL VISIT FOR:  Sandra [name]  By participating in this virtual visit I agree to the following:  I hereby voluntarily request, consent and authorize [facility] and its employed or contracted physicians, physician assistants, nurse practitioners or other licensed health care professionals (the Practitioner), to provide me with telemedicine health care services (the "Services) as deemed necessary by the treating Practitioner. I acknowledge and consent to receive the Services by the Practitioner via telemedicine. I understand that the telemedicine visit will involve communicating with the Practitioner through live audiovisual communication technology and the disclosure of certain medical information by electronic transmission. I acknowledge that I have been given the opportunity to request an in-person assessment or other available alternative prior to the telemedicine visit and am voluntarily participating in the telemedicine visit.  I understand that I have the right to withhold or withdraw my consent to the use of telemedicine in the course of my care at any time, without affecting my right to future care or treatment, and that the Practitioner or I may terminate the telemedicine visit at any time. I understand that I have the right to inspect all information obtained and/or recorded in the course of the telemedicine visit and may receive copies of available information for a reasonable fee.  I understand that some of the potential risks of receiving the Services via telemedicine include:  Delay or interruption in medical evaluation due to technological equipment failure or disruption; Information transmitted may not be sufficient (e.g. poor resolution of  images) to allow for appropriate medical decision making by the Practitioner; and/or  In rare instances, security protocols could fail, causing a breach of personal health information.  Furthermore, I acknowledge that it is my responsibility to provide information about my medical history, conditions and care that is complete and accurate to the best of my ability. I acknowledge that Practitioner's advice, recommendations, and/or decision may be based on factors not within their control, such as incomplete or inaccurate data provided by me or distortions of diagnostic images or specimens that may result from electronic transmissions. I understand that the practice of medicine is not an exact science and that Practitioner makes no warranties or guarantees regarding treatment outcomes. I acknowledge that a copy of this consent can be made available to me via my patient portal [facility] MyChart), or I can request a printed copy by calling the office of [facility].    I understand that my insurance will be billed for this visit.   I have read or had this consent read to me. I understand the contents of this consent, which adequately explains the benefits and risks of the Services being provided via telemedicine.  I have been provided ample opportunity to ask questions regarding this consent and the Services and have had my questions answered to my satisfaction. I give my informed consent for the services to be provided through the use of telemedicine in my medical care

## 2023-01-26 NOTE — Telephone Encounter (Signed)
 Patient is scheduled for telephone visit consent and med rec done

## 2023-01-27 ENCOUNTER — Other Ambulatory Visit (HOSPITAL_COMMUNITY): Payer: Self-pay | Admitting: Family Medicine

## 2023-01-27 DIAGNOSIS — Z1231 Encounter for screening mammogram for malignant neoplasm of breast: Secondary | ICD-10-CM

## 2023-01-28 ENCOUNTER — Telehealth: Payer: Self-pay | Admitting: *Deleted

## 2023-01-28 DIAGNOSIS — G5620 Lesion of ulnar nerve, unspecified upper limb: Secondary | ICD-10-CM | POA: Insufficient documentation

## 2023-01-28 NOTE — Telephone Encounter (Signed)
   Pre-operative Risk Assessment    Patient Name: Sandra [name]  DOB: 05-Jun-1975 MRN: [id number]   Date of last office visit: 11/18/22 Eligha Bridegroom, NP Date of next office visit: 02/03/23 TELE VISIT PREOP CLEARANCE     Request for Surgical Clearance    Procedure:   RIGHT ELBOW ULNAR NERVE RELEASE WITH ANTERIOR TRANSPOSITION AND FLEXOR PRONATOR LENGTHENING  AS NECESSARY  Date of Surgery:  Clearance TBD                                Surgeon:  DR. Dominica Severin Surgeon's Group or Practice Name:  [facility] Phone number:  [phone] Fax number:  [phone] [name]   Type of Clearance Requested:   - Medical  - Pharmacy:  Hold Aspirin     Type of Anesthesia:  General    Additional requests/questions:    [name]   01/28/2023, 5:42 PM

## 2023-01-29 NOTE — Telephone Encounter (Signed)
   Name: Sandra [name]  DOB: [birth year]  MRN: [id number]  Primary Cardiologist: Charlton Haws, MD  Chart reviewed as part of pre-operative protocol coverage. The patient has an upcoming visit scheduled with [facility] on 02/03/2023 at which time clearance can be addressed in case there are any issues that would impact surgical recommendations.  I will route this message as FYI to requesting party and remove this message from the preop box as separate preop APP input not needed at this time.   Please call with any questions.  Napoleon Form, Leodis Rains, NP  01/29/2023, 9:14 AM

## 2023-02-03 ENCOUNTER — Ambulatory Visit: Payer: MEDICAID | Attending: Cardiology

## 2023-02-03 DIAGNOSIS — Z01810 Encounter for preprocedural cardiovascular examination: Secondary | ICD-10-CM | POA: Diagnosis not present

## 2023-02-03 NOTE — Progress Notes (Signed)
Virtual Visit via Telephone Note   Because of Sandra [name]'s co-morbid illnesses, she is at least at moderate risk for complications without adequate follow up.  This format is felt to be most appropriate for this patient at this time.  The patient did not have access to video technology/had technical difficulties with video requiring transitioning to audio format only (telephone).  All issues noted in this document were discussed and addressed.  No physical exam could be performed with this format.  Please refer to the patient's chart for her consent to telehealth for [facility].  Evaluation Performed:  Preoperative cardiovascular risk assessment _____________   Date:  02/03/2023   Patient ID:  Sandra [name], DOB 03-Aug-1975, MRN [id number] Patient Location:  Home Provider location:   Office  Primary Care Provider:  Oneal Grout, FNP Primary Cardiologist:  Charlton Haws, MD  Chief Complaint / Patient Profile   48 y.o. y/o female with a h/o CAD s/p LHC 2022 with occluded PDA and left-to-right collaterals, 60% OM 2 and 50 to 70% proximal and mid RCA, HLD, HTN, GERD, bladder CA, DM type II who is pending right elbow ulnar nerve release and presents today for telephonic preoperative cardiovascular risk assessment.  History of Present Illness    Sandra [name] is a 48 y.o. female who presents via audio/video conferencing for a telehealth visit today.  Pt was last seen in cardiology clinic on 11/18/2022 by Eligha Bridegroom, NP.  At that time Sandra [name] was doing well no new cardiac complaints.  Increase physical activity to help with decreasing ASCVD risk.  The patient is now pending procedure as outlined above. Since her last visit, she has been doing well with no new cardiac complaints.  She is scheduled to undergo a procedure on her foot hopefully in February or March.  She was recently [place] assistance for her [place] and will start taking her medication this week.    She denies chest pain, shortness of breath, lower extremity edema, fatigue, palpitations, melena, hematuria, hemoptysis, diaphoresis, weakness, presyncope, syncope, orthopnea, and PND.  f no symptoms at the time of phone call, she can hold ASA x 5-7 days prior to procedure   Past Medical History    Past Medical History:  Diagnosis Date   ADHD (attention deficit hyperactivity disorder)    Allergic rhinitis    Anemia    hx of   Anxiety    Arthritis    Barrett's esophagus    Bipolar 1 disorder (HCC)    Bladder tumor    CAD (coronary artery disease)    Cancer (HCC)    bladder   Constipation    Depression    Dilated cardiomyopathy (HCC) 12/20/2020   Echo 6/22 ([facility]): EF 43, mild MR, mild TR // Echo 3/22: EF 50-55   Dizziness    Full dentures    GERD (gastroesophageal reflux disease)    Gross hematuria    Hyperlipidemia    Hypertension    Stable angina (HCC)    Type 2 diabetes mellitus (HCC)    Urgency of urination    dysuria, sui   Wears glasses    Past Surgical History:  Procedure Laterality Date   ABDOMINAL HYSTERECTOMY     ANTERIOR CERVICAL DECOMP/DISCECTOMY FUSION N/A 03/05/2022   Procedure: ANTERIOR CERVICAL DECOMPRESSION/DISCECTOMY FUSION 1 LEVEL; C6-C7;  Surgeon: Venita Lick, MD;  Location: [facility];  Service: Orthopedics;  Laterality: N/A;  3 hrs 3 C-Bed   BALLOON DILATION N/A 02/17/2021  Procedure: BALLOON DILATION;  Surgeon: Lanelle Bal, DO;  Location: [facility];  Service: Endoscopy;  Laterality: N/A;   BIOPSY  02/17/2021   Procedure: BIOPSY;  Surgeon: Lanelle Bal, DO;  [facility];  Service: Endoscopy;;   COLONOSCOPY WITH PROPOFOL N/A 02/17/2021   Surgeon: Lanelle Bal, DO; nonbleeding internal hemorrhoids, otherwise normal exam.  Repeat in 10 years.   CYSTOSCOPY N/A 10/03/2015   Procedure: CYSTOSCOPY;  Surgeon: Bjorn Pippin, MD;  Location: [facility];  Service: Urology;  Laterality: N/A;   CYSTOSCOPY W/  URETERAL STENT PLACEMENT Right 07/24/2021   Procedure: CYSTOSCOPY WITH RETROGRADE PYELOGRAM/URETERAL STENT PLACEMENT;  Surgeon: Malen Gauze, MD;  Location: [facility];  Service: Urology;  Laterality: Right;   CYSTOSCOPY WITH RETROGRADE PYELOGRAM, URETEROSCOPY AND STENT PLACEMENT Right 08/14/2021   Procedure: CYSTOSCOPY WITH RETROGRADE PYELOGRAM, URETEROSCOPY AND STENT EXCHANGE;  Surgeon: Malen Gauze, MD;  Location: [facility];  Service: Urology;  Laterality: Right;   CYSTOSCOPY WITH STENT PLACEMENT Right 10/31/2015   Procedure: CYSTOSCOPY WITH ATTEMPTED RIGHT URETERAL OPENING;  Surgeon: Bjorn Pippin, MD;  Location: [facility];  Service: Urology;  Laterality: Right;   ESOPHAGOGASTRODUODENOSCOPY (EGD) WITH PROPOFOL N/A 02/17/2021   Surgeon: Lanelle Bal, DO; 2 cm hiatal hernia, Barrett's esophagus, gastritis with biopsies negative for H. pylori.  S/p empiric esophageal dilation.  Repeat in 3 years.   FOOT SURGERY Left    September 2022, broke 7 bones in left foot   INTERSTIM IMPLANT PLACEMENT N/A 03/14/2018   Procedure: Leane Platt IMPLANT FIRST STAGE;  Surgeon: Malen Gauze, MD;  Location: [facility];  Service: Urology;  Laterality: N/A;   INTERSTIM IMPLANT PLACEMENT N/A 03/30/2018   Procedure: Leane Platt IMPLANT SECOND STAGE;  Surgeon: Malen Gauze, MD;  Location: [facility];  Service: Urology;  Laterality: N/A;   INTERSTIM IMPLANT REVISION N/A 12/01/2021   Procedure: REVISION OF Leane Platt- generator anf lead replacement;  Surgeon: Malen Gauze, MD;  Location: [facility];  Service: Urology;  Laterality: N/A;   LEFT HEART CATH AND CORONARY ANGIOGRAPHY N/A 05/31/2020   Procedure: LEFT HEART CATH AND CORONARY ANGIOGRAPHY;  Surgeon: Lennette Bihari, MD;  [facility];  Service: Cardiovascular;  Laterality: N/A;   MULTIPLE TOOTH EXTRACTIONS  2015   OVARIAN CYST REMOVAL Right 2004 approx   SHOULDER OPEN ROTATOR CUFF REPAIR Left 04/20/2019   Procedure:  ROTATOR CUFF REPAIR SHOULDER OPEN;  Surgeon: Vickki Hearing, MD;  Location: [facility];  Service: Orthopedics;  Laterality: Left;   TONSILLECTOMY  12/30/2004   TOTAL ABDOMINAL HYSTERECTOMY W/ BILATERAL SALPINGOOPHORECTOMY  05/16/2004   TRANSURETHRAL RESECTION OF BLADDER TUMOR N/A 10/03/2015   Procedure: TRANSURETHRAL RESECTION OF BLADDER TUMOR (TURBT);  Surgeon: Bjorn Pippin, MD;  Location[facility]  Service: Urology;  Laterality: N/A;   TRANSURETHRAL RESECTION OF BLADDER TUMOR N/A 10/31/2015   Procedure: RE-STAGINGG TRANSURETHRAL RESECTION OF BLADDER TUMOR (TURBT);  Surgeon: Bjorn Pippin, MD;  Location: [facility];  Service: Urology;  Laterality: N/A;   TRANSURETHRAL RESECTION OF BLADDER TUMOR N/A 08/17/2016   Procedure: TRANSURETHRAL RESECTION OF BLADDER TUMOR (TURBT);  Surgeon: Malen Gauze, MD;  Location[facility];  Service: Urology;  Laterality: N/A;   URETERAL BIOPSY Right 08/14/2021   Procedure: URETERAL BIOPSY;  Surgeon: Malen Gauze, MD;  Location: [facility];  Service: Urology;  Laterality: Right;    Allergies  Allergies  Allergen Reactions   Ibuprofen Hives and Itching   Benazepril  Hives   Diphenhydramine Hives   Latex Itching and Dermatitis    States she had redness to skin and itching    Metronidazole Nausea And Vomiting   Oxycodone     Pt has tolerated hydromorphone in the past and takes Norco at home.   Toradol [Ketorolac Tromethamine] Itching   Zofran [Ondansetron Hcl] Hives   Adhesive [Tape] Rash   Codeine Hives, Nausea And Vomiting and Rash   Loratadine Rash   Nystatin-Triamcinolone Rash   Penicillins Rash    Has patient had a PCN reaction causing immediate rash, facial/tongue/throat swelling, SOB or lightheadedness with hypotension: No Has patient had a PCN reaction causing severe rash involving mucus membranes or skin necrosis: Yes Has patient had a PCN reaction that required hospitalization: No Has patient had a PCN  reaction occurring within the last 10 years: no  If all of the above answers are "NO", then may proceed with Cephalosporin use. Other reaction(s): nausea Received ancef w/o issue (7/23)    Home Medications    Prior to Admission medications   Medication Sig Start Date End Date Taking? Authorizing Provider  ACCU-CHEK GUIDE test strip USE AS INSTRUCTED TO MONITOR GLUCOSE 4 TIMES DAILY BEFORE MEALS AND BEFORE BED. 04/29/21   Dani Gobble, NP  Accu-Chek Softclix Lancets lancets Use as instructed to monitor glucose 4 times daily 07/10/20   Dani Gobble, NP  albuterol (PROAIR HFA) 108 (90 Base) MCG/ACT inhaler Inhale 2 puffs into the lungs every 6 (six) hours as needed for wheezing or shortness of breath. 07/24/21   Shon Hale, MD  amLODipine (NORVASC) 5 MG tablet Take 2 tablets (10 mg total) by mouth daily. 04/23/22   Rondel Baton, MD  aspirin EC 81 MG tablet Take 1 tablet (81 mg total) by mouth daily with breakfast. Swallow whole. 07/24/21   Shon Hale, MD  benzonatate (TESSALON) 100 MG capsule Take 1 capsule (100 mg total) by mouth every 8 (eight) hours. 11/24/21   Blue, Soijett A, PA-C  Blood Glucose Monitoring Suppl (ACCU-CHEK GUIDE) w/Device KIT 1 Piece by Does not apply route as directed. 04/15/20   Dani Gobble, NP  Blood Pressure Monitoring (BLOOD PRESSURE CUFF) MISC See admin instructions.    [provider]  buPROPion (WELLBUTRIN XL) 300 MG 24 hr tablet Take 300 mg by mouth every morning. 01/18/20   [provider]  cloNIDine HCl (KAPVAY) 0.1 MG TB12 ER tablet Take 0.1 mg by mouth at bedtime.    [provider]  Continuous Glucose Sensor (DEXCOM G7 SENSOR) MISC Inject 1 Application into the skin as directed. Change sensor every 10 days as directed. 12/18/22   Dani Gobble, NP  cyclobenzaprine (FLEXERIL) 10 MG tablet Take 1 tablet (10 mg total) by mouth 3 (three) times daily as needed for muscle spasms. 03/05/22   Venita Lick, MD   Evolocumab (REPATHA SURECLICK) 140 MG/ML SOAJ INJECT 1 DOSE INTO THE SKIN EVERY 14 DAYS. 10/23/22   Wendall Stade, MD  fenofibrate (TRICOR) 145 MG tablet TAKE 1 TABLET ONCE DAILY. 11/10/22   Wendall Stade, MD  FLUoxetine (PROZAC) 40 MG capsule Take 40 mg by mouth in the morning. 04/02/20   [provider]  fluticasone (FLONASE) 50 MCG/ACT nasal spray Place 2 sprays into both nostrils daily. 06/15/22   Dani Gobble, NP  gabapentin (NEURONTIN) 600 MG tablet Take 600 mg by mouth 3 (three) times daily.    [provider]  GLOBAL EASE INJECT PEN NEEDLES 31G  X 5 MM MISC Inject into the skin. 03/13/21   [provider]  HYDROcodone-acetaminophen (NORCO) 10-325 MG tablet Take 1 tablet by mouth. One tablet four times a day 03/23/22   [provider]  insulin aspart (NOVOLOG) 100 UNIT/ML injection Use with Omnipod for TDD around 150 units daily 12/18/22   Dani Gobble, NP  Insulin Disposable Pump (OMNIPOD DASH PDM, GEN 4,) KIT Change pod every 48-72 hours 04/23/22   Dani Gobble, NP  Insulin Disposable Pump (OMNIPOD DASH PODS, GEN 4,) MISC change pod every 48 hours as directed. 12/18/22   Dani Gobble, NP  Insulin Pen Needle (PEN NEEDLES) 31G X 6 MM MISC Use to inject insulin 4 times daily 02/24/21   Dani Gobble, NP  INSULIN SYRINGE 1CC/29G 29G X 1/2" 1 ML MISC Use to inject insulin into Omnipod 08/05/21   Dani Gobble, NP  isosorbide mononitrate (IMDUR) 30 MG 24 hr tablet Take 1 tablet (30 mg total) by mouth daily. 04/17/22   Wendall Stade, MD  meclizine (ANTIVERT) 25 MG tablet Take 25 mg by mouth 3 (three) times daily as needed. 09/09/22   [provider]  metoprolol succinate (TOPROL-XL) 50 MG 24 hr tablet Take 1 tablet (50 mg total) by mouth daily. Take with or immediately following a meal. 04/17/22   Wendall Stade, MD  nitroGLYCERIN (NITROSTAT) 0.4 MG SL tablet PLACE 1 TAB UNDER TONGUE EVERY 3 MINUTES AS DIRECTED UP TO 3 TIMES AS  NEEDED FOR CHEST PAIN. Patient taking differently: Place 0.4 mg under the tongue every 5 (five) minutes as needed for chest pain. 02/16/22   Tereso Newcomer T, PA-C  pantoprazole (PROTONIX) 40 MG tablet take 1 tablet twice daily before a meal 08/30/22   Letta Median, PA-C  rosuvastatin (CRESTOR) 40 MG tablet Take 1 tablet (40 mg total) by mouth daily. 06/24/21   Hilty, Lisette Abu, MD  tamsulosin (FLOMAX) 0.4 MG CAPS capsule TAKE 1 CAPSULE BY MOUTH AT BEDTIME. 08/31/22   McKenzie, Mardene Celeste, MD  VRAYLAR 1.5 MG capsule Take 1.5 mg by mouth daily. 10/29/22   [provider]  VYVANSE 50 MG capsule Take 50 mg by mouth daily. 11/03/21   [provider]    Physical Exam    Vital Signs:  Sandra [name] does not have vital signs available for review today. 130/82  Given telephonic nature of communication, physical exam is limited. AAOx3. NAD. Normal affect.  Speech and respirations are unlabored.  Accessory Clinical Findings    None  Assessment & Plan    1.  Preoperative Cardiovascular Risk Assessment: -Patient's RCRI score is 6.6%  The patient affirms she has been doing well without any new cardiac symptoms. They are able to achieve 7 METS without cardiac limitations. Therefore, based on ACC/AHA guidelines, the patient would be at acceptable risk for the planned procedure without further cardiovascular testing. The patient was advised that if she develops new symptoms prior to surgery to contact our office to arrange for a follow-up visit, and she verbalized understanding.   The patient was advised that if she develops new symptoms prior to surgery to contact our office to arrange for a follow-up visit, and she verbalized understanding.   She can hold ASA x 5-7 days prior to procedure and restart postprocedure when surgically safe and hemostasis is achieved.  A copy of this note will be routed to requesting surgeon.  Time:   Today, I have spent 7 minutes with the  patient  with telehealth technology discussing medical history, symptoms, and management plan.     Napoleon Form, Leodis Rains, NP  02/03/2023, 7:26 AM

## 2023-02-05 ENCOUNTER — Encounter (INDEPENDENT_AMBULATORY_CARE_PROVIDER_SITE_OTHER): Payer: Self-pay | Admitting: *Deleted

## 2023-03-02 ENCOUNTER — Other Ambulatory Visit (HOSPITAL_COMMUNITY): Payer: Self-pay | Admitting: Orthopedic Surgery

## 2023-03-12 NOTE — Progress Notes (Signed)
 Anesthesia Chart Review: Same day workup  48 year old female follow with cardiology for history of CAD s/p LHC 2022 with occluded PDA and left-to-right collaterals, 60% OM 2 and 50 to 70% proximal and mid RCA, HLD, HTN.  Nuclear stress 04/2022 was negative.  Echo 06/2021 showed EF 50 to 55%, no significant valve abnormalities.  Seen by Robin Searing, NP on 02/03/2023 for preop evaluation.  Per note, "-Patient's RCRI score is 6.6% The patient affirms she has been doing well without any new cardiac symptoms. They are able to achieve 7 METS without cardiac limitations. Therefore, based on ACC/AHA guidelines, the patient would be at acceptable risk for the planned procedure without further cardiovascular testing. The patient was advised that if she develops new symptoms prior to surgery to contact our office to arrange for a follow-up visit, and she verbalized understanding. The patient was advised that if she develops new symptoms prior to surgery to contact our office to arrange for a follow-up visit, and she verbalized understanding. She can hold ASA x 5-7 days prior to procedure and restart postprocedure when surgically safe and hemostasis is achieved."  Other pertinent history includes IDDM 2 on insulin pump (A1c 7.4 on 12/18/22), GERD on pantoprazole, ADHD, bipolar 1.  She will need day of surgery labs and evaluation.  EKG 04/23/2022: Sinus rhythm.  Rate 79. Borderline intraventricular conduction delay. Borderline prolonged QT interval  Nuclear stress 05/04/2022:   Stress ECG is negative for ischemia and arrythmias   LV perfusion is abnormal. There is a small reversible perfusion defect with mild reduction in uptake present in the apical anterior location with normal wall motion in the defect area consistent with ischemia. Breast attenuation artifact cannot be completely ruled out.   Left ventricular function is abnormal. Global function is mildly reduced. Nuclear stress EF: 54 %. Consider 2D Echo for  accurate estimation of LVEF.   The study is normal. The study is intermediate risk based on LVEF 54% otherwise low risk study.  TTE 06/17/2021:  1. Septal hypokinesis inferior basal akinesis . Left ventricular ejection  fraction, by estimation, is 50 to 55%. The left ventricle has low normal  function. The left ventricle demonstrates regional wall motion  abnormalities (see scoring diagram/findings   for description). Left ventricular diastolic parameters were normal.   2. Right ventricular systolic function is normal. The right ventricular  size is normal.   3. The mitral valve is abnormal. Trivial mitral valve regurgitation. No  evidence of mitral stenosis.   4. The aortic valve is tricuspid. Aortic valve regurgitation is not  visualized. No aortic stenosis is present.   5. The inferior vena cava is normal in size with greater than 50%  respiratory variability, suggesting right atrial pressure of 3 mmHg.   Cath 05/31/2020: Multivessel CAD with diffuse small vessels in this diabetic female.  The LAD has mild 20% areas of narrowing.  The ramus intermediate vessel has 30% narrowing.  The circumflex vessel has a percent proximal narrowing on a bend in the vessel and then has 20 to 30% stenoses.  There is focal 60% stenosis in the second marginal vessel.  From the left system there is collateralization to the very distal PDA.  The right coronary artery is diffusely small vessel in its mid segment with focal narrowing of 70% after the anterior RV marginal branch.  The mid -distal PDA is occluded and collateralized from the left circulation.   LVEDP 10 mmHg   RECOMMENDATION: Medical therapy for CAD with optimal blood  pressure control, aggressive lipid management particularly with this patient with significant xanthelasmas on exam diabetic control.      Zannie Cove [facility] Short Stay Center/Anesthesiology Phone [phone] 03/12/2023 2:17 PM

## 2023-03-12 NOTE — Anesthesia Preprocedure Evaluation (Addendum)
 Anesthesia Evaluation  Patient identified by MRN, date of birth, ID band Patient awake    Reviewed: Allergy & Precautions, NPO status , Patient's Chart, lab work & pertinent test results  History of Anesthesia Complications Negative for: history of anesthetic complications  Airway Mallampati: II       Dental no notable dental hx. (+) Edentulous Upper, Edentulous Lower   Pulmonary former smoker   Pulmonary exam normal breath sounds clear to auscultation       Cardiovascular hypertension, Pt. on medications and Pt. on home beta blockers + angina  + CAD  Normal cardiovascular exam Rhythm:Regular Rate:Normal  Echo 06/17/21 1. Septal hypokinesis inferior basal akinesis . Left ventricular ejection  fraction, by estimation, is 50 to 55%. The left ventricle has low normal  function. The left ventricle demonstrates regional wall motion  abnormalities (see scoring diagram/findings   for description). Left ventricular diastolic parameters were normal.   2. Right ventricular systolic function is normal. The right ventricular  size is normal.   3. The mitral valve is abnormal. Trivial mitral valve regurgitation. No  evidence of mitral stenosis.   4. The aortic valve is tricuspid. Aortic valve regurgitation is not  visualized. No aortic stenosis is present.   5. The inferior vena cava is normal in size with greater than 50%  respiratory variability, suggesting right atrial pressure of 3 mmHg.   Cardiac Cath 05/31/20  Prox RCA lesion is 30% stenosed.  RPDA lesion is 100% stenosed.  Prox Cx lesion is 40% stenosed.  1st Mrg lesion is 30% stenosed.  Mid Cx lesion is 20% stenosed.  2nd Mrg lesion is 60% stenosed.  Mid RCA to Dist RCA lesion is 50% stenosed.  Prox RCA to Mid RCA lesion is 70% stenosed.  Prox LAD lesion is 20% stenosed.  Ramus lesion is 30% stenosed.  Mid LAD lesion is 20% stenosed.      Neuro/Psych   PSYCHIATRIC DISORDERS Anxiety Depression Bipolar Disorder   ADHDHx/o cervical myelopathy  Neuromuscular disease    GI/Hepatic Neg liver ROS,GERD  Medicated,,Hx/o Barrett's esophagus   Endo/Other  diabetes, Well Controlled, Type 2, Insulin Dependent  HLD Obesity   Renal/GU Renal diseaseHx/o renal calculi  Female GU complaint S/P interstim implant Hx/o bladder Ca    Musculoskeletal  (+) Arthritis , Osteoarthritis,  narcotic dependentS/P ACDF last year Right ulnar nerve compression   Abdominal  (+) + obese  Peds  (+) ADHD Hematology  (+) Blood dyscrasia, anemia   Anesthesia Other Findings   Reproductive/Obstetrics  s/p hysterectomy                               Anesthesia Physical Anesthesia Plan  ASA: 3  Anesthesia Plan: General   Post-op Pain Management: Tylenol PO (pre-op)*   Induction: Intravenous  PONV Risk Score and Plan: 3 and Treatment may vary due to age or medical condition, Dexamethasone and Propofol infusion  Airway Management Planned: LMA  Additional Equipment: None  Intra-op Plan:   Post-operative Plan: Extubation in OR  Informed Consent: I have reviewed the patients History and Physical, chart, labs and discussed the procedure including the risks, benefits and alternatives for the proposed anesthesia with the patient or authorized representative who has indicated his/her understanding and acceptance.     Dental advisory given  Plan Discussed with: CRNA and Anesthesiologist  Anesthesia Plan Comments: (PAT note by Antionette Poles, PA-C  )  Anesthesia Quick Evaluation

## 2023-03-15 ENCOUNTER — Encounter (HOSPITAL_COMMUNITY): Payer: Self-pay | Admitting: Orthopedic Surgery

## 2023-03-15 NOTE — Progress Notes (Signed)
 SDW CALL  Patient was given pre-op instructions over the phone. The opportunity was given for the patient to ask questions. No further questions asked. Patient verbalized understanding of instructions given.   PCP - Erskine Speed, FNP  Cardiologist - Burna Cash  PPM/ICD - denies Device Orders -  Rep Notified -   Chest x-ray - 04/23/22 EKG - 04/24/22 Stress Test - 05/04/22 ECHO - 06/17/21 Cardiac Cath - 05/31/20  Sleep Study - denies CPAP -   Fasting Blood Sugar - 90-140 Checks Blood Sugar -has a Dexcom  Blood Thinner Instructions: na Aspirin Instructions: pt reports last dose of Aspirin was 03/07/23  ERAS Protcol - clears until 12pm PRE-SURGERY Ensure or G2- no  COVID TEST- na   Anesthesia review: yes-cardiac history  Patient denies shortness of breath, fever, cough and chest pain over the phone call    Surgical Instructions    Your procedure is scheduled on March 4  Report to [facility] Main Entrance "A" at 12:30 PM., then check in with the Admitting office.  Call this number if you have problems the morning of surgery:  [phone]    Remember:  Do not eat after midnight the night before your surgery  You may drink clear liquids until 12pm the morning of your surgery.   Clear liquids allowed are: Water, Non-Citrus Juices (without pulp), Carbonated Beverages, Clear Tea, Black Coffee ONLY (NO MILK, CREAM OR POWDERED CREAMER of any kind), and Gatorade   Take these medicines the morning of surgery with A SIP OF WATER: Amlodipine,Bupropion,Fluoxetine,Gabapentin,Imdur,Metoprolol,Pantoprazole, Scopolomine patch. PRN- Nitroglycerin-if you have to take this medicine, Please call [phone] before coming to the hospital.   As of today, STOP taking any Aspirin (unless otherwise instructed by your surgeon) Aleve, Naproxen, Ibuprofen, Motrin, Advil, Goody's, BC's, all herbal medications, fish oil, and all vitamins.   WHAT DO I DO ABOUT MY DIABETES MEDICATION?  Reduce  the basal rate on your Omnipod by 20% at midnight the night before surgery.   Check your blood sugar the morning of your surgery when you wake up and every 2 hours until you get to the Short Stay unit.  If your blood sugar is less than 70 mg/dL, you will need to treat for low blood sugar: Do not take insulin. Treat a low blood sugar (less than 70 mg/dL) with  cup of clear juice (cranberry or apple), 4 glucose tablets, OR glucose gel. Recheck blood sugar in 15 minutes after treatment (to make sure it is greater than 70 mg/dL). If your blood sugar is not greater than 70 mg/dL on recheck, call [phone] for further instructions. Report your blood sugar to the short stay nurse when you get to Short Stay.  [facility] is not responsible for any belongings or valuables. .   Do NOT Smoke (Tobacco/Vaping)  24 hours prior to your procedure  If you use a CPAP at night, you may bring your mask for your overnight stay.   Contacts, glasses, hearing aids, dentures or partials may not be worn into surgery, please bring cases for these belongings   Patients discharged the day of surgery will not be allowed to drive home, and someone needs to stay with them for 24 hours.   Special instructions:    Oral Hygiene is also important to reduce your risk of infection.  Remember - BRUSH YOUR TEETH THE MORNING OF SURGERY WITH YOUR REGULAR TOOTHPASTE   Day of Surgery:  Take a shower the day of or night before with antibacterial  soap. Wear Clean/Comfortable clothing the morning of surgery Do not apply any deodorants/lotions.   Do not wear jewelry or makeup Do not wear lotions, powders, perfumes/colognes, or deodorant. Do not shave 48 hours prior to surgery.  Men may shave face and neck. Do not bring valuables to the hospital. Do not wear nail polish, gel polish, artificial nails, or any other type of covering on natural nails (fingers and toes) If you have artificial nails or gel coating that need to be  removed by a nail salon, please have this removed prior to surgery. Artificial nails or gel coating may interfere with anesthesia's ability to adequately monitor your vital signs. Remember to brush your teeth WITH YOUR REGULAR TOOTHPASTE.  REMEMBER TO BRING THE REMOTE FOR YOUR INTERSTIM DEVICE.

## 2023-03-16 ENCOUNTER — Ambulatory Visit (HOSPITAL_COMMUNITY)
Admission: RE | Admit: 2023-03-16 | Discharge: 2023-03-16 | Disposition: A | Discharge status: Home / Self Care | Payer: MEDICAID | Source: Ambulatory Visit | Attending: Orthopedic Surgery | Admitting: Orthopedic Surgery

## 2023-03-16 ENCOUNTER — Encounter (HOSPITAL_COMMUNITY): Payer: Self-pay | Admitting: Orthopedic Surgery

## 2023-03-16 ENCOUNTER — Ambulatory Visit (HOSPITAL_BASED_OUTPATIENT_CLINIC_OR_DEPARTMENT_OTHER): Payer: MEDICAID | Admitting: Physician Assistant

## 2023-03-16 ENCOUNTER — Encounter (HOSPITAL_COMMUNITY)
Admission: RE | Disposition: A | Discharge status: Home / Self Care | Payer: Self-pay | Source: Ambulatory Visit | Attending: Orthopedic Surgery

## 2023-03-16 ENCOUNTER — Ambulatory Visit (HOSPITAL_COMMUNITY): Payer: MEDICAID | Admitting: Physician Assistant

## 2023-03-16 ENCOUNTER — Other Ambulatory Visit: Payer: Self-pay

## 2023-03-16 DIAGNOSIS — K219 Gastro-esophageal reflux disease without esophagitis: Secondary | ICD-10-CM | POA: Insufficient documentation

## 2023-03-16 DIAGNOSIS — Z794 Long term (current) use of insulin: Secondary | ICD-10-CM | POA: Diagnosis not present

## 2023-03-16 DIAGNOSIS — G5621 Lesion of ulnar nerve, right upper limb: Secondary | ICD-10-CM | POA: Insufficient documentation

## 2023-03-16 DIAGNOSIS — I10 Essential (primary) hypertension: Secondary | ICD-10-CM | POA: Insufficient documentation

## 2023-03-16 DIAGNOSIS — I2510 Atherosclerotic heart disease of native coronary artery without angina pectoris: Secondary | ICD-10-CM | POA: Insufficient documentation

## 2023-03-16 DIAGNOSIS — F418 Other specified anxiety disorders: Secondary | ICD-10-CM

## 2023-03-16 DIAGNOSIS — E1142 Type 2 diabetes mellitus with diabetic polyneuropathy: Secondary | ICD-10-CM | POA: Insufficient documentation

## 2023-03-16 DIAGNOSIS — Z6833 Body mass index (BMI) 33.0-33.9, adult: Secondary | ICD-10-CM | POA: Insufficient documentation

## 2023-03-16 DIAGNOSIS — Z87891 Personal history of nicotine dependence: Secondary | ICD-10-CM | POA: Insufficient documentation

## 2023-03-16 DIAGNOSIS — E669 Obesity, unspecified: Secondary | ICD-10-CM | POA: Diagnosis not present

## 2023-03-16 HISTORY — DX: Personal history of urinary calculi: Z87.442

## 2023-03-16 HISTORY — PX: ANTERIOR INTEROSSEOUS NERVE DECOMPRESSION: SHX5735

## 2023-03-16 LAB — BASIC METABOLIC PANEL
Anion gap: 11 (ref 5–15)
BUN: 14 mg/dL (ref 6–20)
CO2: 22 mmol/L (ref 22–32)
Calcium: 9.3 mg/dL (ref 8.9–10.3)
Chloride: 103 mmol/L (ref 98–111)
Creatinine, Ser: 0.82 mg/dL (ref 0.44–1.00)
GFR, Estimated: >60 mL/min (ref >60)
Glucose, Bld: 77 mg/dL (ref 70–99)
Potassium: 4 mmol/L (ref 3.5–5.1)
Sodium: 136 mmol/L (ref 135–145)

## 2023-03-16 LAB — GLUCOSE, CAPILLARY
Glucose-Capillary: 80 mg/dL (ref 70–99)
Glucose-Capillary: 75 mg/dL (ref 70–99)
Glucose-Capillary: 84 mg/dL (ref 70–99)
Glucose-Capillary: 120 mg/dL — ABNORMAL HIGH (ref 70–99)

## 2023-03-16 LAB — CBC
HCT: 41.8 % (ref 36.0–46.0)
Hemoglobin: 13.4 g/dL (ref 12.0–15.0)
MCH: 25.8 pg — ABNORMAL LOW (ref 26.0–34.0)
MCHC: 32.1 g/dL (ref 30.0–36.0)
MCV: 80.5 fL (ref 80.0–100.0)
Platelets: 412 10*3/uL — ABNORMAL HIGH (ref 150–400)
RBC: 5.19 MIL/uL — ABNORMAL HIGH (ref 3.87–5.11)
RDW: 14.4 % (ref 11.5–15.5)
WBC: 9.5 10*3/uL (ref 4.0–10.5)
nRBC: 0 % (ref 0.0–0.2)

## 2023-03-16 SURGERY — ANTERIOR INTEROSSEOUS NERVE DECOMPRESSION
Anesthesia: General | Site: Elbow | Laterality: Right

## 2023-03-16 MED ORDER — SODIUM CHLORIDE 0.9 % IV SOLN: 12.5000 mg | INTRAVENOUS | Status: DC | PRN

## 2023-03-16 MED ORDER — OXYCODONE HCL 5 MG PO TABS
5.0000 mg | ORAL_TABLET | Freq: Once | ORAL | Status: AC
  Administered 2023-03-16: 5 mg via ORAL

## 2023-03-16 MED ORDER — OXYCODONE HCL 5 MG PO TABS
ORAL_TABLET | ORAL | Status: AC
Start: 2023-03-16 — End: 2023-03-16
  Filled 2023-03-16: qty 1

## 2023-03-16 MED ORDER — AMISULPRIDE (ANTIEMETIC) 5 MG/2ML IV SOLN
INTRAVENOUS | Status: AC
  Filled 2023-03-16: qty 4

## 2023-03-16 MED ORDER — AMISULPRIDE (ANTIEMETIC) 5 MG/2ML IV SOLN
10.0000 mg | Freq: Once | INTRAVENOUS | Status: AC | PRN
  Administered 2023-03-16: 10 mg via INTRAVENOUS

## 2023-03-16 MED ORDER — LIDOCAINE 2% (20 MG/ML) 5 ML SYRINGE
INTRAMUSCULAR | Status: AC
  Filled 2023-03-16: qty 5

## 2023-03-16 MED ORDER — ACETAMINOPHEN 500 MG PO TABS
1000.0000 mg | ORAL_TABLET | Freq: Once | ORAL | Status: AC
  Administered 2023-03-16: 1000 mg via ORAL
  Filled 2023-03-16: qty 2

## 2023-03-16 MED ORDER — FENTANYL CITRATE (PF) 250 MCG/5ML IJ SOLN
INTRAMUSCULAR | Status: DC | PRN
  Administered 2023-03-16 (×5): 50 ug via INTRAVENOUS

## 2023-03-16 MED ORDER — PHENYLEPHRINE HCL-NACL 20-0.9 MG/250ML-% IV SOLN
INTRAVENOUS | Status: DC | PRN
  Administered 2023-03-16: 35 ug/min via INTRAVENOUS

## 2023-03-16 MED ORDER — ORAL CARE MOUTH RINSE: 15.0000 mL | Freq: Once | OROMUCOSAL | Status: AC

## 2023-03-16 MED ORDER — BUPIVACAINE HCL (PF) 0.25 % IJ SOLN
INTRAMUSCULAR | Status: DC | PRN
  Administered 2023-03-16: 20 mL

## 2023-03-16 MED ORDER — PROPOFOL 10 MG/ML IV BOLUS
INTRAVENOUS | Status: AC
  Filled 2023-03-16: qty 20

## 2023-03-16 MED ORDER — CHLORHEXIDINE GLUCONATE 0.12 % MT SOLN: 15.0000 mL | Freq: Once | OROMUCOSAL | Status: AC

## 2023-03-16 MED ORDER — CHLORHEXIDINE GLUCONATE 0.12 % MT SOLN
OROMUCOSAL | Status: AC
Start: 2023-03-16 — End: 2023-03-16
  Administered 2023-03-16: 15 mL via OROMUCOSAL
  Filled 2023-03-16: qty 15

## 2023-03-16 MED ORDER — LACTATED RINGERS IV SOLN: INTRAVENOUS | Status: DC

## 2023-03-16 MED ORDER — MIDAZOLAM HCL 2 MG/2ML IJ SOLN
INTRAMUSCULAR | Status: DC | PRN
  Administered 2023-03-16: 2 mg via INTRAVENOUS

## 2023-03-16 MED ORDER — ONDANSETRON HCL 4 MG/2ML IJ SOLN
INTRAMUSCULAR | Status: DC | PRN
  Administered 2023-03-16: 4 mg via INTRAVENOUS

## 2023-03-16 MED ORDER — FENTANYL CITRATE (PF) 100 MCG/2ML IJ SOLN
25.0000 ug | INTRAMUSCULAR | Status: DC | PRN
  Administered 2023-03-16: 50 ug via INTRAVENOUS

## 2023-03-16 MED ORDER — LIDOCAINE 2% (20 MG/ML) 5 ML SYRINGE
INTRAMUSCULAR | Status: DC | PRN
  Administered 2023-03-16: 100 mg via INTRAVENOUS

## 2023-03-16 MED ORDER — BUPIVACAINE HCL (PF) 0.25 % IJ SOLN
INTRAMUSCULAR | Status: AC
  Filled 2023-03-16: qty 30

## 2023-03-16 MED ORDER — CEFAZOLIN SODIUM-DEXTROSE 2-4 GM/100ML-% IV SOLN
2.0000 g | INTRAVENOUS | Status: AC
  Administered 2023-03-16: 2 g via INTRAVENOUS

## 2023-03-16 MED ORDER — PROPOFOL 500 MG/50ML IV EMUL
INTRAVENOUS | Status: DC | PRN
  Administered 2023-03-16: 35 ug/kg/min via INTRAVENOUS
  Administered 2023-03-16: 120 mg via INTRAVENOUS

## 2023-03-16 MED ORDER — CEFAZOLIN SODIUM-DEXTROSE 2-4 GM/100ML-% IV SOLN
INTRAVENOUS | Status: AC
  Filled 2023-03-16: qty 100

## 2023-03-16 MED ORDER — DEXAMETHASONE SODIUM PHOSPHATE 10 MG/ML IJ SOLN
INTRAMUSCULAR | Status: AC
  Filled 2023-03-16: qty 1

## 2023-03-16 MED ORDER — ONDANSETRON HCL 4 MG/2ML IJ SOLN
INTRAMUSCULAR | Status: AC
  Filled 2023-03-16: qty 2

## 2023-03-16 MED ORDER — MIDAZOLAM HCL 2 MG/2ML IJ SOLN
INTRAMUSCULAR | Status: AC
  Filled 2023-03-16: qty 2

## 2023-03-16 MED ORDER — DEXAMETHASONE SODIUM PHOSPHATE 10 MG/ML IJ SOLN
INTRAMUSCULAR | Status: DC | PRN
  Administered 2023-03-16: 4 mg via INTRAVENOUS

## 2023-03-16 MED ORDER — FENTANYL CITRATE (PF) 250 MCG/5ML IJ SOLN
INTRAMUSCULAR | Status: AC
  Filled 2023-03-16: qty 5

## 2023-03-16 MED ORDER — FENTANYL CITRATE (PF) 100 MCG/2ML IJ SOLN
INTRAMUSCULAR | Status: AC
Start: 2023-03-16 — End: 2023-03-16
  Filled 2023-03-16: qty 2

## 2023-03-16 SURGICAL SUPPLY — 40 items
BAG COUNTER SPONGE SURGICOUNT (BAG) ×1 IMPLANT
BNDG ELASTIC 3INX 5YD STR LF (GAUZE/BANDAGES/DRESSINGS) IMPLANT
BNDG ELASTIC 4X5.8 VLCR STR LF (GAUZE/BANDAGES/DRESSINGS) IMPLANT
BNDG GAUZE DERMACEA FLUFF 4 (GAUZE/BANDAGES/DRESSINGS) ×2 IMPLANT
CORD BIPOLAR FORCEPS 12FT (ELECTRODE) ×1 IMPLANT
COVER SURGICAL LIGHT HANDLE (MISCELLANEOUS) ×1 IMPLANT
CUFF TOURN SGL QUICK 18X4 (TOURNIQUET CUFF) IMPLANT
CUFF TRNQT CYL 24X4X16.5-23 (TOURNIQUET CUFF) IMPLANT
DRAPE OEC MINIVIEW 54X84 (DRAPES) IMPLANT
DRAPE SURG 17X23 STRL (DRAPES) ×1 IMPLANT
DRSG ADAPTIC 3X8 NADH LF (GAUZE/BANDAGES/DRESSINGS) IMPLANT
GAUZE SPONGE 4X4 12PLY STRL (GAUZE/BANDAGES/DRESSINGS) IMPLANT
GAUZE XEROFORM 1X8 LF (GAUZE/BANDAGES/DRESSINGS) IMPLANT
GAUZE XEROFORM 5X9 LF (GAUZE/BANDAGES/DRESSINGS) IMPLANT
GLOVE BIOGEL M 8.0 STRL (GLOVE) ×1 IMPLANT
GLOVE SS BIOGEL STRL SZ 8 (GLOVE) ×1 IMPLANT
GOWN STRL REUS W/ TWL LRG LVL3 (GOWN DISPOSABLE) ×2 IMPLANT
GOWN STRL REUS W/ TWL XL LVL3 (GOWN DISPOSABLE) ×3 IMPLANT
KIT BASIN OR (CUSTOM PROCEDURE TRAY) ×1 IMPLANT
KIT TURNOVER KIT B (KITS) ×1 IMPLANT
LOOP VASCLR MAXI BLUE 18IN ST (MISCELLANEOUS) IMPLANT
MANIFOLD NEPTUNE II (INSTRUMENTS) ×1 IMPLANT
NDL HYPO 25GX1X1/2 BEV (NEEDLE) IMPLANT
NEEDLE HYPO 25GX1X1/2 BEV (NEEDLE) IMPLANT
NS IRRIG 1000ML POUR BTL (IV SOLUTION) ×1 IMPLANT
PACK ORTHO EXTREMITY (CUSTOM PROCEDURE TRAY) ×1 IMPLANT
PAD ARMBOARD 7.5X6 YLW CONV (MISCELLANEOUS) ×2 IMPLANT
PAD CAST 4YDX4 CTTN HI CHSV (CAST SUPPLIES) IMPLANT
SLING ARM FOAM STRAP MED (SOFTGOODS) IMPLANT
SOL PREP POV-IOD 4OZ 10% (MISCELLANEOUS) ×3 IMPLANT
SPIKE FLUID TRANSFER (MISCELLANEOUS) ×1 IMPLANT
SPLINT FIBERGLASS 4X30 (CAST SUPPLIES) IMPLANT
SUT PROLENE 4 0 PS 2 18 (SUTURE) IMPLANT
SUT VIC AB 2-0 CT1 TAPERPNT 27 (SUTURE) IMPLANT
SYR CONTROL 10ML LL (SYRINGE) IMPLANT
TIE VASCULAR MAXI BLUE 18IN ST (MISCELLANEOUS) ×1 IMPLANT
TOWEL GREEN STERILE (TOWEL DISPOSABLE) ×1 IMPLANT
TOWEL GREEN STERILE FF (TOWEL DISPOSABLE) ×1 IMPLANT
TUBE CONNECTING 12X1/4 (SUCTIONS) IMPLANT
UNDERPAD 30X36 HEAVY ABSORB (UNDERPADS AND DIAPERS) ×1 IMPLANT

## 2023-03-16 NOTE — Anesthesia Procedure Notes (Signed)
 Procedure Name: LMA Insertion Date/Time: 03/16/2023 3:15 PM  Performed by: Sudie Grumbling, CRNAPre-anesthesia Checklist: Patient identified, Emergency Drugs available, Suction available, Patient being monitored and Timeout performed Patient Re-evaluated:Patient Re-evaluated prior to induction Oxygen Delivery Method: Circle system utilized Preoxygenation: Pre-oxygenation with 100% oxygen Induction Type: IV induction LMA: LMA inserted LMA Size: 4.0 Number of attempts: 1 Dental Injury: Teeth and Oropharynx as per pre-operative assessment

## 2023-03-16 NOTE — Transfer of Care (Signed)
 Immediate Anesthesia Transfer of Care Note  Patient: Sandra [name]  Procedure(s) Performed: Right elbow ulnar nerve release with anterior transposition and flexor pronator lengthening as necessary. (Right: Elbow)  Patient Location: PACU  Anesthesia Type:General  Level of Consciousness: sedated  Airway & Oxygen Therapy: Patient Spontanous Breathing and Patient connected to nasal cannula oxygen  Post-op Assessment: Report given to RN and Post -op Vital signs reviewed and stable  Post vital signs: Reviewed and stable  Last Vitals:  Vitals Value Taken Time  BP 153/95 03/16/23 1633  Temp    Pulse 73 03/16/23 1636  Resp 14 03/16/23 1636  SpO2 97 % 03/16/23 1636  Vitals shown include unfiled device data.  Last Pain:  Vitals:   03/16/23 1455  TempSrc:   PainSc: 8       Patients Stated Pain Goal: 4 (03/16/23 1248)  Complications: No notable events documented.

## 2023-03-16 NOTE — Anesthesia Postprocedure Evaluation (Signed)
 Anesthesia Post Note  Patient: Sandra [name]  Procedure(s) Performed: Right elbow ulnar nerve release with anterior transposition and flexor pronator lengthening as necessary. (Right: Elbow)     Patient location during evaluation: PACU Anesthesia Type: General Level of consciousness: awake and alert and oriented Pain management: pain level controlled Vital Signs Assessment: post-procedure vital signs reviewed and stable Respiratory status: spontaneous breathing, nonlabored ventilation and respiratory function stable Cardiovascular status: blood pressure returned to baseline and stable Postop Assessment: no apparent nausea or vomiting Anesthetic complications: no   No notable events documented.  Last Vitals:  Vitals:   03/16/23 1633 03/16/23 1645  BP: (!) 153/95 (!) 163/88  Pulse: 80 75  Resp: 16 16  Temp: 36.7 C   SpO2: 98% 97%    Last Pain:  Vitals:   03/16/23 1633  TempSrc:   PainSc: Asleep                 Tequia Wolman A.

## 2023-03-16 NOTE — Op Note (Signed)
 Operative note 03/16/2023.  Sandra Severin, MD.  Date of operation and dictation 03/16/2023.  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  Operative procedure #1 right elbow ulnar nerve decompression and anterior transposition at the elbow. #2 flexor pronator Z-lengthening/fascial release at the right elbow.  Surgeon Sandra [name]  anesthesia General Estimated blood loss minimal.  Tourniquet time less than an hour.  Drains none.  Indications for the procedure 48 year old female who presents the above-mentioned diagnosis have counseled her in regards to risk and benefits of surgery and she desires to proceed all questions have been encouraged and answered.  Operative procedure in detail:.  Patient was taken to the operative theater and underwent smooth induction of anesthesia in the form of a general anesthetic.  She was prepped with Hibiclens scrub followed by Betadine scrub followed by timeout being observed.  Preoperative antibiotics were given.  Preop checklist complete and all body parts well-padded.  Once this was completed I then inflated the tourniquet and made a posterior medial incision about the elbow.  Dissection was carried down the medial antebrachial cutaneous nerve branches were identified and protected.  This was done under 4.5 loupe magnification.  Following this we then very carefully and cautiously released the arcade of Struthers, medial intermuscular septum which was excised and the cubital tunnel followed by Osborne's ligament and the 2 heads of the FCU both superficial and deep.  The nerve was mobilized on its epineurial plexus of vessels and vessel loop was placed around the nerve.  The nerve was noted to be highly compressed at the cubital tunnel and Osborne's ligament region.  Once the nerve was  mobilized it was then nicely able to be transposed.  I performed a flexor pronator Z-lengthening with fascial release and the patient tolerated this nicely.  The nerve sat well.  I make sure all constricting forces were off of the nerve and at goal I did well in its new home anteriorly transposed.  At this juncture I then performed cubital tunnel closure followed by medial muscular septum incorporated into the cubital tunnel to prevent a bumper followed by checking the nerve multiple times for any subluxation.  The nerve did not sublux.  I sutured the most stable rim of the adipose tissue to the medial epicondyle and this also further prevented subluxation.  With cubital tunnel closure and the takedown of the adipose tissue the patient looked very well.  I was pleased with this.  I deflated tourniquet obtained hemostasis with bipolar electrocautery as we did during multiple portions of the operation and closed wound with Prolene.  Long-arm splint was placed.  Compartments were soft.  Refill was excellent.  There were no complicating features.  I have discussed with the patient the postop algorithm and her pain management.  Should any problems occur she will notify us.  RTC 2 weeks with suture removal and our standard postop protocol.  All questions have been addressed with the patient and her family.  Sandra Severin, MD

## 2023-03-16 NOTE — H&P (Signed)
 Sandra [name] is an 48 y.o. female.   Chief Complaint: Patient presents for right elbow ulnar nerve release with anterior ulnar nerve transposition and flexor pronator lengthening HPI: Patient presents for evaluation and treatment of the of their upper extremity predicament. The patient denies neck, back, chest or  abdominal pain. The patient notes that they have no lower extremity problems. The patients primary complaint is noted. We are planning surgical care pathway for the upper extremity.   Past Medical History:  Diagnosis Date   ADHD (attention deficit hyperactivity disorder)    Allergic rhinitis    Anemia    hx of   Anxiety    Arthritis    Barrett's esophagus    Bipolar 1 disorder (HCC)    Bladder tumor    CAD (coronary artery disease)    Cancer (HCC)    bladder   Constipation    Depression    Dilated cardiomyopathy (HCC) 12/20/2020   Echo 6/22 ([facility]): EF 43, mild MR, mild TR // Echo 3/22: EF 50-55   Dizziness    Full dentures    GERD (gastroesophageal reflux disease)    Gross hematuria    History of kidney stones    Hyperlipidemia    Hypertension    Stable angina (HCC)    Type 2 diabetes mellitus (HCC)    Urgency of urination    dysuria, sui   Wears glasses     Past Surgical History:  Procedure Laterality Date   ABDOMINAL HYSTERECTOMY     ANTERIOR CERVICAL DECOMP/DISCECTOMY FUSION N/A 03/05/2022   Procedure: ANTERIOR CERVICAL DECOMPRESSION/DISCECTOMY FUSION 1 LEVEL; C6-C7;  Surgeon: Venita Lick, MD;  Location: [facility];  Service: Orthopedics;  Laterality: N/A;  3 hrs 3 C-Bed   BALLOON DILATION N/A 02/17/2021   Procedure: BALLOON DILATION;  Surgeon: Lanelle Bal, DO;  Location: [facility];  Service: Endoscopy;  Laterality: N/A;   BIOPSY  02/17/2021   Procedure: BIOPSY;  Surgeon: Lanelle Bal, DO;  [facility]: [facility];  Service: Endoscopy;;   COLONOSCOPY WITH PROPOFOL N/A 02/17/2021   Surgeon: Lanelle Bal, DO; nonbleeding internal  hemorrhoids, otherwise normal exam.  Repeat in 10 years.   CYSTOSCOPY N/A 10/03/2015   Procedure: CYSTOSCOPY;  Surgeon: Bjorn Pippin, MD;  Location: [facility];  Service: Urology;  Laterality: N/A;   CYSTOSCOPY W/ URETERAL STENT PLACEMENT Right 07/24/2021   Procedure: CYSTOSCOPY WITH RETROGRADE PYELOGRAM/URETERAL STENT PLACEMENT;  Surgeon: Malen Gauze, MD;  Location: [facility];  Service: Urology;  Laterality: Right;   CYSTOSCOPY WITH RETROGRADE PYELOGRAM, URETEROSCOPY AND STENT PLACEMENT Right 08/14/2021   Procedure: CYSTOSCOPY WITH RETROGRADE PYELOGRAM, URETEROSCOPY AND STENT EXCHANGE;  Surgeon: Malen Gauze, MD;  Location: [facility];  Service: Urology;  Laterality: Right;   CYSTOSCOPY WITH STENT PLACEMENT Right 10/31/2015   Procedure: CYSTOSCOPY WITH ATTEMPTED RIGHT URETERAL OPENING;  Surgeon: Bjorn Pippin, MD;  Location: [facility];  Service: Urology;  Laterality: Right;   ESOPHAGOGASTRODUODENOSCOPY (EGD) WITH PROPOFOL N/A 02/17/2021   Surgeon: Lanelle Bal, DO; 2 cm hiatal hernia, Barrett's esophagus, gastritis with biopsies negative for H. pylori.  S/p empiric esophageal dilation.  Repeat in 3 years.   FOOT SURGERY Left    September 2022, broke 7 bones in left foot   INTERSTIM IMPLANT PLACEMENT N/A 03/14/2018   Procedure: Leane Platt IMPLANT FIRST STAGE;  Surgeon: Malen Gauze, MD;  Location: [facility];  Service: Urology;  Laterality: N/A;   INTERSTIM IMPLANT PLACEMENT N/A 03/30/2018  Procedure: INTERSTIM IMPLANT SECOND STAGE;  Surgeon: Malen Gauze, MD;  Location: [facility];  Service: Urology;  Laterality: N/A;   INTERSTIM IMPLANT REVISION N/A 12/01/2021   Procedure: REVISION OF Leane Platt- generator anf lead replacement;  Surgeon: Malen Gauze, MD;  Location: [facility];  Service: Urology;  Laterality: N/A;   LEFT HEART CATH AND CORONARY ANGIOGRAPHY N/A 05/31/2020   Procedure: LEFT HEART CATH AND CORONARY ANGIOGRAPHY;  Surgeon: Lennette Bihari, MD;  Location: [facility];  Service: Cardiovascular;  Laterality: N/A;   MULTIPLE TOOTH EXTRACTIONS  2015   OVARIAN CYST REMOVAL Right 2004 approx   SHOULDER OPEN ROTATOR CUFF REPAIR Left 04/20/2019   Procedure: ROTATOR CUFF REPAIR SHOULDER OPEN;  Surgeon: Vickki Hearing, MD;  Location: [facility];  Service: Orthopedics;  Laterality: Left;   TONSILLECTOMY  12/30/2004   TOTAL ABDOMINAL HYSTERECTOMY W/ BILATERAL SALPINGOOPHORECTOMY  05/16/2004   TRANSURETHRAL RESECTION OF BLADDER TUMOR N/A 10/03/2015   Procedure: TRANSURETHRAL RESECTION OF BLADDER TUMOR (TURBT);  Surgeon: Bjorn Pippin, MD;  Location: [facility];  Service: Urology;  Laterality: N/A;   TRANSURETHRAL RESECTION OF BLADDER TUMOR N/A 10/31/2015   Procedure: RE-STAGINGG TRANSURETHRAL RESECTION OF BLADDER TUMOR (TURBT);  Surgeon: Bjorn Pippin, MD;  Location: [facility];  Service: Urology;  Laterality: N/A;   TRANSURETHRAL RESECTION OF BLADDER TUMOR N/A 08/17/2016   Procedure: TRANSURETHRAL RESECTION OF BLADDER TUMOR (TURBT);  Surgeon: Malen Gauze, MD;  Location: [facility];  Service: Urology;  Laterality: N/A;   URETERAL BIOPSY Right 08/14/2021   Procedure: URETERAL BIOPSY;  Surgeon: Malen Gauze, MD;  Location: [facility];  Service: Urology;  Laterality: Right;    Family History  Problem Relation Age of Onset   Hypertension Mother    Cancer Mother    Breast cancer Maternal Aunt    Lung cancer Maternal Uncle    Cancer Other    Diabetes Daughter    Colon cancer Neg Hx    Social History:  reports that she quit smoking about 2 years ago. Her smoking use included cigarettes. She started smoking about 12 years ago. She has a 5 pack-year smoking history. She has never used smokeless tobacco. She reports that she does not drink alcohol and does not use drugs.  Allergies:  Allergies  Allergen Reactions   Ibuprofen Hives and Itching   Benazepril Hives   Diphenhydramine Hives    Latex Itching and Dermatitis    States she had redness to skin and itching    Metronidazole Nausea And Vomiting   Oxycodone     Pt has tolerated hydromorphone in the past and takes Norco at home.   Toradol [Ketorolac Tromethamine] Itching   Zofran [Ondansetron Hcl] Hives   Adhesive [Tape] Rash   Codeine Hives, Nausea And Vomiting and Rash   Loratadine Rash   Nystatin-Triamcinolone Rash   Penicillins Nausea Only and Rash    Received ancef w/o issue (7/23)    Medications Prior to Admission  Medication Sig Dispense Refill   amLODipine (NORVASC) 5 MG tablet Take 2 tablets (10 mg total) by mouth daily. 90 tablet 3   aspirin EC 81 MG tablet Take 1 tablet (81 mg total) by mouth daily with breakfast. Swallow whole. 30 tablet 11   buPROPion (WELLBUTRIN XL) 300 MG 24 hr tablet Take 300 mg by mouth every morning.     cariprazine (VRAYLAR) 3 MG capsule Take 3 mg by mouth at bedtime.     cloNIDine HCl (KAPVAY) 0.1  MG TB12 ER tablet Take 0.1 mg by mouth at bedtime.     Evolocumab (REPATHA SURECLICK) 140 MG/ML SOAJ INJECT 1 DOSE INTO THE SKIN EVERY 14 DAYS. 2 mL 11   fenofibrate (TRICOR) 145 MG tablet TAKE 1 TABLET ONCE DAILY. 30 tablet 3   FLUoxetine (PROZAC) 40 MG capsule Take 40 mg by mouth in the morning.     gabapentin (NEURONTIN) 800 MG tablet Take 800 mg by mouth 3 (three) times daily.     HYDROcodone-acetaminophen (NORCO) 10-325 MG tablet Take 1 tablet by mouth. One tablet four times a day     insulin aspart (NOVOLOG) 100 UNIT/ML injection Use with Omnipod for TDD around 150 units daily 80 mL 6   Insulin Disposable Pump (OMNIPOD DASH PODS, GEN 4,) MISC change pod every 48 hours as directed. 15 each 3   isosorbide mononitrate (IMDUR) 30 MG 24 hr tablet Take 1 tablet (30 mg total) by mouth daily. 90 tablet 3   lisdexamfetamine (VYVANSE) 70 MG capsule Take 70 mg by mouth daily.     meclizine (ANTIVERT) 25 MG tablet Take 25 mg by mouth 4 (four) times a week.     metoprolol succinate (TOPROL-XL)  50 MG 24 hr tablet Take 1 tablet (50 mg total) by mouth daily. Take with or immediately following a meal. 90 tablet 3   nitroGLYCERIN (NITROSTAT) 0.4 MG SL tablet PLACE 1 TAB UNDER TONGUE EVERY 3 MINUTES AS DIRECTED UP TO 3 TIMES AS NEEDED FOR CHEST PAIN. (Patient taking differently: Place 0.4 mg under the tongue every 5 (five) minutes as needed for chest pain.) 25 tablet 0   pantoprazole (PROTONIX) 40 MG tablet take 1 tablet twice daily before a meal 60 tablet 2   rosuvastatin (CRESTOR) 40 MG tablet Take 1 tablet (40 mg total) by mouth daily. (Patient taking differently: Take 20 mg by mouth daily.) 90 tablet 3   scopolamine (TRANSDERM-SCOP) 1 MG/3DAYS Place 1 patch onto the skin every 3 (three) days.     tamsulosin (FLOMAX) 0.4 MG CAPS capsule TAKE 1 CAPSULE BY MOUTH AT BEDTIME. 90 capsule 3   Vitamin D, Ergocalciferol, (DRISDOL) 1.25 MG (50000 UNIT) CAPS capsule Take 50,000 Units by mouth once a week.     ACCU-CHEK GUIDE test strip USE AS INSTRUCTED TO MONITOR GLUCOSE 4 TIMES DAILY BEFORE MEALS AND BEFORE BED. 400 strip 2   Accu-Chek Softclix Lancets lancets Use as instructed to monitor glucose 4 times daily 100 each 12   Blood Glucose Monitoring Suppl (ACCU-CHEK GUIDE) w/Device KIT 1 Piece by Does not apply route as directed. 1 kit 0   Blood Pressure Monitoring (BLOOD PRESSURE CUFF) MISC See admin instructions.     Continuous Glucose Sensor (DEXCOM G7 SENSOR) MISC Inject 1 Application into the skin as directed. Change sensor every 10 days as directed. 9 each 3   cyclobenzaprine (FLEXERIL) 10 MG tablet Take 1 tablet (10 mg total) by mouth 3 (three) times daily as needed for muscle spasms. 5 tablet 0   fluticasone (FLONASE) 50 MCG/ACT nasal spray Place 2 sprays into both nostrils daily. (Patient not taking: Reported on 03/15/2023) 16 g 6   GLOBAL EASE INJECT PEN NEEDLES 31G X 5 MM MISC Inject into the skin.     Insulin Disposable Pump (OMNIPOD DASH PDM, GEN 4,) KIT Change pod every 48-72 hours (Patient  not taking: Reported on 03/15/2023) 1 kit 0   insulin glargine (LANTUS) 100 UNIT/ML injection Inject into the skin as needed (Back up if needed).  Insulin Pen Needle (PEN NEEDLES) 31G X 6 MM MISC Use to inject insulin 4 times daily 100 each 11   INSULIN SYRINGE 1CC/29G 29G X 1/2" 1 ML MISC Use to inject insulin into Omnipod 100 each 6    Results for orders placed or performed during the hospital encounter of 03/16/23 (from the past 48 hours)  Basic metabolic panel per protocol     Status: None   Collection Time: 03/16/23 12:20 PM  Result Value Ref Range   Sodium 136 135 - 145 mmol/L   Potassium 4.0 3.5 - 5.1 mmol/L   Chloride 103 98 - 111 mmol/L   CO2 22 22 - 32 mmol/L   Glucose, Bld 77 70 - 99 mg/dL    Comment: Glucose reference range applies only to samples taken after fasting for at least 8 hours.   BUN 14 6 - 20 mg/dL   Creatinine, Ser 1.19 0.44 - 1.00 mg/dL   Calcium 9.3 8.9 - 14.7 mg/dL   GFR, Estimated >82 >95 mL/min    Comment: (NOTE) Calculated using the CKD-EPI Creatinine Equation (2021)    Anion gap 11 5 - 15    Comment: Performed at [facility], [address], [place], [place] [address]  CBC per protocol     Status: Abnormal   Collection Time: 03/16/23 12:20 PM  Result Value Ref Range   WBC 9.5 4.0 - 10.5 K/uL   RBC 5.19 (H) 3.87 - 5.11 MIL/uL   Hemoglobin 13.4 12.0 - 15.0 g/dL   HCT 86.5 78.4 - 69.6 %   MCV 80.5 80.0 - 100.0 fL   MCH 25.8 (L) 26.0 - 34.0 pg   MCHC 32.1 30.0 - 36.0 g/dL   RDW 29.5 28.4 - 13.2 %   Platelets 412 (H) 150 - 400 K/uL   nRBC 0.0 0.0 - 0.2 %    Comment: Performed at [facility], [address] [place], [place] [address]  Glucose, capillary     Status: None   Collection Time: 03/16/23 12:27 PM  Result Value Ref Range   Glucose-Capillary 80 70 - 99 mg/dL    Comment: Glucose reference range applies only to samples taken after fasting for at least 8 hours.  Glucose, capillary     Status: None   Collection Time: 03/16/23   1:38 PM  Result Value Ref Range   Glucose-Capillary 75 70 - 99 mg/dL    Comment: Glucose reference range applies only to samples taken after fasting for at least 8 hours.   Comment 1 Notify RN   Glucose, capillary     Status: None   Collection Time: 03/16/23  2:46 PM  Result Value Ref Range   Glucose-Capillary 84 70 - 99 mg/dL    Comment: Glucose reference range applies only to samples taken after fasting for at least 8 hours.   No results found.  Review of Systems  Blood pressure (!) 140/80, pulse 69, temperature 98.3 F (36.8 C), temperature source Oral, resp. rate 20, height 5' (1.524 m), weight 76.7 kg, SpO2 97%. 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  Will plan to proceed with surgical intervention.  The patient is alert and oriented in no acute distress. The patient complains of pain in the affected upper extremity.  The patient is noted to have a normal HEENT exam. Lung fields show equal chest expansion and no shortness of breath. Abdomen exam is nontender without distention. Lower  extremity examination does not show any fracture dislocation or blood clot symptoms. Pelvis is stable and the neck and back are stable and nontender.   Assessment/Plan Plan ulnar nerve decompression and anterior transposition.  Patient understands all issues plans and concerns.  Had a lengthy talk about the postop rehabilitative protocol and her pain management.  All questions addressed.  We are planning surgery for your upper extremity. The risk and benefits of surgery to include risk of bleeding, infection, anesthesia,  damage to normal structures and failure of the surgery to accomplish its intended goals of relieving symptoms and restoring function have been discussed in detail. With this in mind we plan to proceed. I have specifically discussed with the patient the pre-and postoperative  regime and the dos and don'ts and risk and benefits in great detail. Risk and benefits of surgery also include risk of dystrophy(CRPS), chronic nerve pain, failure of the healing process to go onto completion and other inherent risks of surgery The relavent the pathophysiology of the disease/injury process, as well as the alternatives for treatment and postoperative course of action has been discussed in great detail with the patient who desires to proceed.  We will do everything in our power to help you (the patient) restore function to the upper extremity. It is a pleasure to see this patient today.   Oletta Cohn III, MD 03/16/2023, 3:03 PM

## 2023-03-16 NOTE — Discharge Instructions (Signed)
 Elevate move massage your arm and fingers.  We encourage you to take vitamin B6 100 mg a day for nerve healing.  Dr. Carlos Levering cell phone is [phone] for any emergencies.  Our office will call to see you in 14 days.  We recommend that you to take vitamin C 1000 mg a day to promote healing. We also recommend that if you require  pain medicine that you take a stool softener to prevent constipation as most pain medicines will have constipation side effects. We recommend either Peri-Colace or Senokot and recommend that you also consider adding MiraLAX as well to prevent the constipation affects from pain medicine if you are required to use them. These medicines are over the counter and may be purchased at a local pharmacy. A cup of yogurt and a probiotic can also be helpful during the recovery process as the medicines can disrupt your intestinal environment.Keep bandage clean and dry.  Call for any problems.  No smoking.  Criteria for driving a car: you should be off your pain medicine for 7-8 hours, able to drive one handed(confident), thinking clearly and feeling able in your judgement to drive. Continue elevation as it will decrease swelling.  If instructed by MD move your fingers within the confines of the bandage/splint.  Use ice if instructed by your MD. Call immediately for any sudden loss of feeling in your hand/arm or change in functional abilities of the extremity.

## 2023-03-17 ENCOUNTER — Encounter (HOSPITAL_COMMUNITY): Payer: Self-pay | Admitting: Orthopedic Surgery

## 2023-03-26 DIAGNOSIS — M51369 Other intervertebral disc degeneration, lumbar region without mention of lumbar back pain or lower extremity pain: Secondary | ICD-10-CM | POA: Insufficient documentation

## 2023-04-03 NOTE — Progress Notes (Unsigned)
 Sandra [name], female    DOB: January 17, 1975    MRN: [id number]   Brief patient profile:  48  yowf  quit smoking 01/2021 and grew up in house lots of infections and need for inhalers at age 48  referred to pulmonary clinic in [place]  04/05/2023 by Erskine Speed NP  for doe cough     Wt at d/c smoking  170  lb on prn inhaler  but doe gradually worse since then esp fall 2024    History of Present Illness  04/05/2023  Pulmonary/ 1st office eval/ Sherene Sires / [place] [facility] @ wt  184  Chief Complaint  Patient presents with   Establish Care  Dyspnea:  carrying grandson across the house [employer] once a month pushes cart x 3 aisles  Cough: on and off thick green assoc with nasal drainage middle of the day none  Sleep: bed is flat, 3 pillow s resp cc  SABA use: none  02: none     No obvious day to day or daytime pattern/variability or assoc   mucus plugs or hemoptysis or cp or chest tightness, subjective wheeze or overt  hb symptoms.    Also denies any obvious fluctuation of symptoms with weather or environmental changes or other aggravating or alleviating factors except as outlined above   No unusual exposure hx or h/o childhood pna/ asthma or knowledge of premature birth.  Current Allergies, Complete Past Medical History, Past Surgical History, Family History, and Social History were reviewed in [employer] record.  ROS  The following are not active complaints unless bolded Hoarseness, sore throat, dysphagia, dental problems, itching, sneezing,  nasal congestion or discharge of excess mucus or purulent secretions, ear ache,   fever, chills, sweats, unintended wt loss or wt gain, classically pleuritic or exertional cp,  orthopnea pnd or arm/hand swelling  or leg swelling, presyncope, palpitations, abdominal pain, anorexia, nausea, vomiting, diarrhea  or change in bowel habits or change in bladder habits, change in stools or change in urine, dysuria, hematuria,  rash,  arthralgias, visual complaints, headache, numbness, weakness or ataxia or problems with walking or coordination,  change in mood or  memory.            Outpatient Medications Prior to Visit  Medication Sig Dispense Refill   ACCU-CHEK GUIDE test strip USE AS INSTRUCTED TO MONITOR GLUCOSE 4 TIMES DAILY BEFORE MEALS AND BEFORE BED. 400 strip 2   Accu-Chek Softclix Lancets lancets Use as instructed to monitor glucose 4 times daily 100 each 12   amLODipine (NORVASC) 5 MG tablet Take 2 tablets (10 mg total) by mouth daily. 90 tablet 3   aspirin EC 81 MG tablet Take 1 tablet (81 mg total) by mouth daily with breakfast. Swallow whole. 30 tablet 11   Blood Glucose Monitoring Suppl (ACCU-CHEK GUIDE) w/Device KIT 1 Piece by Does not apply route as directed. 1 kit 0   Blood Pressure Monitoring (BLOOD PRESSURE CUFF) MISC See admin instructions.     buPROPion (WELLBUTRIN XL) 300 MG 24 hr tablet Take 300 mg by mouth every morning.     cariprazine (VRAYLAR) 3 MG capsule Take 3 mg by mouth at bedtime.     cloNIDine HCl (KAPVAY) 0.1 MG TB12 ER tablet Take 0.1 mg by mouth at bedtime.     Continuous Glucose Sensor (DEXCOM G7 SENSOR) MISC Inject 1 Application into the skin as directed. Change sensor every 10 days as directed. 9 each 3   cyclobenzaprine (FLEXERIL) 10  MG tablet Take 1 tablet (10 mg total) by mouth 3 (three) times daily as needed for muscle spasms. 5 tablet 0   Evolocumab (REPATHA SURECLICK) 140 MG/ML SOAJ INJECT 1 DOSE INTO THE SKIN EVERY 14 DAYS. 2 mL 11   fenofibrate (TRICOR) 145 MG tablet TAKE 1 TABLET ONCE DAILY. 30 tablet 3   FLUoxetine (PROZAC) 40 MG capsule Take 40 mg by mouth in the morning.     gabapentin (NEURONTIN) 800 MG tablet Take 800 mg by mouth 3 (three) times daily.     GLOBAL EASE INJECT PEN NEEDLES 31G X 5 MM MISC Inject into the skin.     HYDROcodone-acetaminophen (NORCO) 10-325 MG tablet Take 1 tablet by mouth. One tablet four times a day     insulin aspart (NOVOLOG) 100  UNIT/ML injection Use with Omnipod for TDD around 150 units daily 80 mL 6   Insulin Disposable Pump (OMNIPOD DASH PDM, GEN 4,) KIT Change pod every 48-72 hours 1 kit 0   Insulin Disposable Pump (OMNIPOD DASH PODS, GEN 4,) MISC change pod every 48 hours as directed. 15 each 3   Insulin Pen Needle (PEN NEEDLES) 31G X 6 MM MISC Use to inject insulin 4 times daily 100 each 11   INSULIN SYRINGE 1CC/29G 29G X 1/2" 1 ML MISC Use to inject insulin into Omnipod 100 each 6   isosorbide mononitrate (IMDUR) 30 MG 24 hr tablet Take 1 tablet (30 mg total) by mouth daily. 90 tablet 3   lisdexamfetamine (VYVANSE) 60 MG capsule Take 60 mg by mouth every morning.     meclizine (ANTIVERT) 25 MG tablet Take 25 mg by mouth 4 (four) times a week.     metoprolol succinate (TOPROL-XL) 50 MG 24 hr tablet Take 1 tablet (50 mg total) by mouth daily. Take with or immediately following a meal. 90 tablet 3   nitroGLYCERIN (NITROSTAT) 0.4 MG SL tablet PLACE 1 TAB UNDER TONGUE EVERY 3 MINUTES AS DIRECTED UP TO 3 TIMES AS NEEDED FOR CHEST PAIN. (Patient taking differently: Place 0.4 mg under the tongue every 5 (five) minutes as needed for chest pain.) 25 tablet 0   pantoprazole (PROTONIX) 40 MG tablet take 1 tablet twice daily before a meal 60 tablet 2   rosuvastatin (CRESTOR) 40 MG tablet Take 1 tablet (40 mg total) by mouth daily. (Patient taking differently: Take 20 mg by mouth daily.) 90 tablet 3   scopolamine (TRANSDERM-SCOP) 1 MG/3DAYS Place 1 patch onto the skin every 3 (three) days.     tamsulosin (FLOMAX) 0.4 MG CAPS capsule TAKE 1 CAPSULE BY MOUTH AT BEDTIME. 90 capsule 3   Vitamin D, Ergocalciferol, (DRISDOL) 1.25 MG (50000 UNIT) CAPS capsule Take 50,000 Units by mouth once a week.     fluticasone (FLONASE) 50 MCG/ACT nasal spray Place 2 sprays into both nostrils daily. (Patient not taking: Reported on 03/15/2023) 16 g 6   insulin glargine (LANTUS) 100 UNIT/ML injection Inject into the skin as needed (Back up if needed).  (Patient not taking: Reported on 04/05/2023)     lisdexamfetamine (VYVANSE) 70 MG capsule Take 70 mg by mouth daily.     No facility-administered medications prior to visit.    Past Medical History:  Diagnosis Date   ADHD (attention deficit hyperactivity disorder)    Allergic rhinitis    Anemia    hx of   Anxiety    Arthritis    Barrett's esophagus    Bipolar 1 disorder (HCC)    Bladder tumor  CAD (coronary artery disease)    Cancer (HCC)    bladder   Constipation    Depression    Dilated cardiomyopathy (HCC) 12/20/2020   Echo 6/22 ([facility]): EF 43, mild MR, mild TR // Echo 3/22: EF 50-55   Dizziness    Full dentures    GERD (gastroesophageal reflux disease)    Gross hematuria    History of kidney stones    Hyperlipidemia    Hypertension    Stable angina (HCC)    Type 2 diabetes mellitus (HCC)    Urgency of urination    dysuria, sui   Wears glasses       Objective:     BP 131/83   Pulse (!) 104   Ht 5' (1.524 m)   Wt 184 lb (83.5 kg)   SpO2 97%   BMI 35.94 kg/m   SpO2: 97 %RA  Mod obese (by bmi) amb pleasant wf nad    HEENT : Oropharynx  clear/ edentulous   Nasal turbinates nl    NECK :  without  apparent JVD/ palpable Nodes/TM    LUNGS: no acc muscle use,  Min barrel  contour chest wall with bilateral  slightly decreased bs s audible wheeze and  without cough on insp or exp maneuvers and min  Hyperresonant  to  percussion bilaterally    CV:  RRR  no s3 or murmur or increase in P2, and no edema   ABD:  obese soft and nontender with pos end  insp Hoover's  in the supine position.  No bruits or organomegaly appreciated   MS:  Nl gait/ ext warm relatively thin  without deformities Or obvious joint restrictions  calf tenderness, cyanosis or clubbing     SKIN: warm and dry without lesions    NEURO:  alert, approp, nl sensorium with  no motor or cerebellar deficits apparent.            Assessment   Asthmatic bronchitis , chronic (HCC) Quit  smoking 2023 @ wt 170  - 04/05/2023   @ 1t 184 Walked on 3  x  3  lap(s) =  approx 450  ft  @ moderate to brisk pace, stopped due to end of study  with lowest 02 sats 96% and short of breath at end  Allergy screen 04/05/2023 >  Eos 0. /  IgE   alpha one AT phenotype   - 04/05/2023  After extensive coaching inhaler device,  effectiveness =    50% > trial of breztri and if improves scan use symbicort  80 2bid as breztri may not be covered   Active AB  >  rx  omnicef x 10 ? Element of sinusitis as well and not anaphylactic to pcn (GI cc)   Pt may be  Group D (now reclassified as E) in terms of symptom/risk and laba/lama/ICS  therefore appropriate rx at this point >>>  breztri trial / samples pending pfts ordered today.       Former cigarette smoker Low-dose CT lung cancer screening is recommended for patients who are 71-48 years of age with a 20+ pack-year history of smoking and who are currently smoking or quit <=15 years ago. No coughing up blood  No unintentional weight loss of > 15 pounds in the last 6 months - pt is eligible for scanning yearly until 2038    Discussed in detail all the  indications, usual  risks and alternatives  relative to the benefits with patient who agrees  to proceed with w/u as outlined.     Each maintenance medication was reviewed in detail including emphasizing most importantly the difference between maintenance and prns and under what circumstances the prns are to be triggered using an action plan format where appropriate.  Total time for H and P, chart review, counseling, reviewing hfa device(s) , directly observing portions of ambulatory 02 saturation study/ and generating customized AVS unique to this office visit / same day charting = 45 min new pt eval                        Sandrea Hughs, MD 04/05/2023

## 2023-04-05 ENCOUNTER — Ambulatory Visit: Payer: MEDICAID | Admitting: Internal Medicine

## 2023-04-05 ENCOUNTER — Encounter: Payer: Self-pay | Admitting: Internal Medicine

## 2023-04-05 VITALS — BP 131/83 | HR 104 | Ht 60.0 in | Wt 184.0 lb

## 2023-04-05 DIAGNOSIS — J4489 Other specified chronic obstructive pulmonary disease: Secondary | ICD-10-CM | POA: Diagnosis not present

## 2023-04-05 DIAGNOSIS — Z87891 Personal history of nicotine dependence: Secondary | ICD-10-CM

## 2023-04-05 MED ORDER — BREZTRI AEROSPHERE 160-9-4.8 MCG/ACT IN AERO: INHALATION_SPRAY | RESPIRATORY_TRACT | 11 refills | Status: AC

## 2023-04-05 MED ORDER — CEFDINIR 300 MG PO CAPS: 300.0000 mg | ORAL_CAPSULE | Freq: Two times a day (BID) | ORAL | 0 refills | Status: DC

## 2023-04-05 NOTE — Assessment & Plan Note (Addendum)
 Quit smoking 2023 @ wt 170  - 04/05/2023   @ 1t 184 Walked on 3  x  3  lap(s) =  approx 450  ft  @ moderate to brisk pace, stopped due to end of study  with lowest 02 sats 96% and short of breath at end  Allergy screen 04/05/2023 >  Eos 0. /  IgE   alpha one AT phenotype   - 04/05/2023  After extensive coaching inhaler device,  effectiveness =    50% > trial of breztri and if improves scan use symbicort  80 2bid as breztri may not be covered   Active AB  >  rx  omnicef x 10 ? Element of sinusitis as well and not anaphylactic to pcn (GI cc)   Pt may be  Group D (now reclassified as E) in terms of symptom/risk and laba/lama/ICS  therefore appropriate rx at this point >>>  breztri trial / samples pending pfts ordered today.

## 2023-04-05 NOTE — Patient Instructions (Addendum)
 Omnicef 300 mg twice daily x 10 day   Plan A = Automatic = Always=    Breztri Take 2 puffs first thing in am and then another 2 puffs about 12 hours later.    Work on inhaler technique:  relax and gently blow all the way out then take a nice smooth full deep breath back in, triggering the inhaler at same time you start breathing in.  Hold breath in for at least  5 seconds if you can. Blow out  breztri  thru nose. Rinse and gargle with water when done.  If mouth or throat bother you at all,  try brushing teeth/gums/tongue with arm and hammer toothpaste/ make a slurry and gargle and spit out.   >>>  Remember how golfers warm up by taking practice swings - do this with an empty inhaler      Please remember to go to the lab department   for your tests - we will call you with the results when they are available.      My office will be contacting you by phone  [phone]- [phone])  for referral to lung cancer screening  and PFTs boh at [facility]  - if you don't hear back from my office within one week,  please call us back or notify us thru MyChart and we'll address it right away.       Please schedule a follow up office visit in 6 weeks, call sooner if needed with all medications /inhalers/ solutions in hand so we can verify exactly what you are taking. This includes all medications from all doctors and over the counters

## 2023-04-05 NOTE — Assessment & Plan Note (Addendum)
 Low-dose CT lung cancer screening is recommended for patients who are 73-50 years of age with a 20+ pack-year history of smoking and who are currently smoking or quit <=15 years ago. No coughing up blood  No unintentional weight loss of > 15 pounds in the last 6 months - pt is eligible for scanning yearly until 2038    Discussed in detail all the  indications, usual  risks and alternatives  relative to the benefits with patient who agrees to proceed with w/u as outlined.     Each maintenance medication was reviewed in detail including emphasizing most importantly the difference between maintenance and prns and under what circumstances the prns are to be triggered using an action plan format where appropriate.  Total time for H and P, chart review, counseling, reviewing hfa device(s) , directly observing portions of ambulatory 02 saturation study/ and generating customized AVS unique to this office visit / same day charting = 45 min new pt eval

## 2023-04-06 ENCOUNTER — Telehealth: Payer: Self-pay

## 2023-04-06 NOTE — Telephone Encounter (Signed)
*  Pulm  Pharmacy Patient Advocate Encounter   Received notification from CoverMyMeds that prior authorization for Breztri Aerosphere 160-9-4.8MCG/ACT aerosol  is required/requested.   Insurance verification completed.   The patient is insured through [employer] .   Per test claim: PA required; PA submitted to above mentioned insurance via CoverMyMeds Key/confirmation #/EOC [id number] Status is pending

## 2023-04-07 NOTE — Telephone Encounter (Signed)
 Denied today by [employer] 2017 Document: Decision Notes: Policy rules found in the [facility] Preferred Drug List (PDL) for ?Inhaled Corticosteroid Combinations? guided our decision. Here are the policy requirements your request did not meet: 1. Records show you have tried and failed at least two (2) preferred drugs from this drug class or group of drugs. Preferred drugs for this plan are Advair Diskus, Advair HFA inhaler, Dulera inhaler, Symbicort inhaler . Please note, the drugs listed here may require pre-approval and have limits on the amount covered. OR 2. Records show you have a documented allergy or contraindication to all the preferred drugs in this drug class or group of drugs. This means you have tried these drugs before and had a bad reaction such as a skin rash, itching, swelling, wheezing, or other breathing problems, or you have a medical reason why all the preferred drugs cannot be used. Since the policy requirements have not been met, we are not able to approve this request. If you cannot use other products for your health issue, your doctor can send more information to show why the other drugs cannot be used. Please look at the formulary to see what drugs are covered. Pre-approval may be needed, and quantity limits may apply to covered drugs.

## 2023-04-07 NOTE — Telephone Encounter (Signed)
Please advise next steps

## 2023-04-09 ENCOUNTER — Other Ambulatory Visit: Payer: Self-pay | Admitting: Nurse Practitioner

## 2023-04-09 MED ORDER — BUDESONIDE-FORMOTEROL FUMARATE 80-4.5 MCG/ACT IN AERO: 2.0000 | INHALATION_SPRAY | Freq: Two times a day (BID) | RESPIRATORY_TRACT | 3 refills | Status: DC

## 2023-04-09 NOTE — Telephone Encounter (Signed)
 I called and spoke to pt. Pt notified of Dr Thurston Hole note. RX was sent in to preferred pharmacy. NFN

## 2023-04-09 NOTE — Addendum Note (Signed)
 Addended by: Gay Filler T on: 04/09/2023 11:29 AM   Modules accepted: Orders

## 2023-04-12 LAB — CBC WITH DIFFERENTIAL/PLATELET
Basophils Absolute: 0.1 10*3/uL (ref 0.0–0.2)
Basos: 1 %
EOS (ABSOLUTE): 0.2 10*3/uL (ref 0.0–0.4)
Eos: 1 %
Hematocrit: 46 % (ref 34.0–46.6)
Hemoglobin: 14.5 g/dL (ref 11.1–15.9)
Immature Grans (Abs): 0 10*3/uL (ref 0.0–0.1)
Immature Granulocytes: 0 %
Lymphocytes Absolute: 3.3 10*3/uL — ABNORMAL HIGH (ref 0.7–3.1)
Lymphs: 31 %
MCH: 25.7 pg — ABNORMAL LOW (ref 26.6–33.0)
MCHC: 31.5 g/dL (ref 31.5–35.7)
MCV: 82 fL (ref 79–97)
Monocytes Absolute: 0.8 10*3/uL (ref 0.1–0.9)
Monocytes: 7 %
Neutrophils Absolute: 6.4 10*3/uL (ref 1.4–7.0)
Neutrophils: 60 %
Platelets: 410 10*3/uL (ref 150–450)
RBC: 5.64 x10E6/uL — ABNORMAL HIGH (ref 3.77–5.28)
RDW: 14 % (ref 11.7–15.4)
WBC: 10.7 10*3/uL (ref 3.4–10.8)

## 2023-04-12 LAB — ALPHA-1-ANTITRYPSIN PHENOTYP: A-1 Antitrypsin: 156 mg/dL (ref 101–187)

## 2023-04-16 ENCOUNTER — Other Ambulatory Visit: Payer: Self-pay | Admitting: Cardiovascular Disease

## 2023-04-19 ENCOUNTER — Ambulatory Visit: Payer: MEDICAID | Admitting: Nurse Practitioner

## 2023-04-19 DIAGNOSIS — E782 Mixed hyperlipidemia: Secondary | ICD-10-CM

## 2023-04-19 DIAGNOSIS — Z794 Long term (current) use of insulin: Secondary | ICD-10-CM

## 2023-04-19 DIAGNOSIS — E1165 Type 2 diabetes mellitus with hyperglycemia: Secondary | ICD-10-CM

## 2023-04-19 DIAGNOSIS — I10 Essential (primary) hypertension: Secondary | ICD-10-CM

## 2023-04-23 ENCOUNTER — Telehealth: Payer: Self-pay

## 2023-04-23 ENCOUNTER — Encounter (HOSPITAL_BASED_OUTPATIENT_CLINIC_OR_DEPARTMENT_OTHER): Payer: Self-pay | Admitting: Orthopaedic Surgery

## 2023-04-23 ENCOUNTER — Other Ambulatory Visit: Payer: Self-pay

## 2023-04-23 ENCOUNTER — Telehealth: Payer: Self-pay | Admitting: *Deleted

## 2023-04-23 NOTE — Telephone Encounter (Signed)
 Pt has been scheduled tele preop appt 04/27/23 due to procedure date. Med rec and consent are done.     Patient Consent for Virtual Visit        Sandra [name] has provided verbal consent on 04/23/2023 for a virtual visit (video or telephone).   CONSENT FOR VIRTUAL VISIT FOR:  Sandra [name]  By participating in this virtual visit I agree to the following:  I hereby voluntarily request, consent and authorize [facility] and its employed or contracted physicians, physician assistants, nurse practitioners or other licensed health care professionals (the Practitioner), to provide me with telemedicine health care services (the "Services") as deemed necessary by the treating Practitioner. I acknowledge and consent to receive the Services by the Practitioner via telemedicine. I understand that the telemedicine visit will involve communicating with the Practitioner through live audiovisual communication technology and the disclosure of certain medical information by electronic transmission. I acknowledge that I have been given the opportunity to request an in-person assessment or other available alternative prior to the telemedicine visit and am voluntarily participating in the telemedicine visit.  I understand that I have the right to withhold or withdraw my consent to the use of telemedicine in the course of my care at any time, without affecting my right to future care or treatment, and that the Practitioner or I may terminate the telemedicine visit at any time. I understand that I have the right to inspect all information obtained and/or recorded in the course of the telemedicine visit and may receive copies of available information for a reasonable fee.  I understand that some of the potential risks of receiving the Services via telemedicine include:  Delay or interruption in medical evaluation due to technological equipment failure or disruption; Information transmitted may not be  sufficient (e.g. poor resolution of images) to allow for appropriate medical decision making by the Practitioner; and/or  In rare instances, security protocols could fail, causing a breach of personal health information.  Furthermore, I acknowledge that it is my responsibility to provide information about my medical history, conditions and care that is complete and accurate to the best of my ability. I acknowledge that Practitioner's advice, recommendations, and/or decision may be based on factors not within their control, such as incomplete or inaccurate data provided by me or distortions of diagnostic images or specimens that may result from electronic transmissions. I understand that the practice of medicine is not an exact science and that Practitioner makes no warranties or guarantees regarding treatment outcomes. I acknowledge that a copy of this consent can be made available to me via my patient portal [facility] MyChart), or I can request a printed copy by calling the office of [facility].    I understand that my insurance will be billed for this visit.   I have read or had this consent read to me. I understand the contents of this consent, which adequately explains the benefits and risks of the Services being provided via telemedicine.  I have been provided ample opportunity to ask questions regarding this consent and the Services and have had my questions answered to my satisfaction. I give my informed consent for the services to be provided through the use of telemedicine in my medical care

## 2023-04-23 NOTE — Telephone Encounter (Signed)
 Pt has been scheduled tele preop appt 04/27/23 due to procedure date. Med rec and consent are done.

## 2023-04-23 NOTE — Telephone Encounter (Signed)
   Pre-operative Risk Assessment    Patient Name: Sandra [name]  DOB: 05/19/1975 MRN: [id number]   Date of last office visit: 04/17/22 Charlton Haws, MD Date of next office visit: 05/26/23 Eligha Bridegroom, NP ([facility])   Request for Surgical Clearance    Procedure:   LEFT 2ND HAMMERTOE CORRECTION WITH PIP FUSION, POSSIBLE PINNING, POSSIBLE 2ND METATARSAL WELL OSTEOTOMY VS MET HEAD RESECTION, FOOT CALLUS DEBRIDEMENT  Date of Surgery:  Clearance 05/03/23                                Surgeon:  DR Netta Cedars Surgeon's Group or Practice Name:  [facility] Phone number:  [phone] Fax number:  [phone] ATTN: MEGAN DAVIS   Type of Clearance Requested:   - Medical  - Pharmacy:  Hold Aspirin     Type of Anesthesia:  Not Indicated   Additional requests/questions:    SignedMarlow Baars   04/23/2023, 1:06 PM

## 2023-04-23 NOTE — Progress Notes (Signed)
 Chart reviewed with Dr Jean Rosenthal, [place] with resolution of sympthoms 2d shy of 2 weeks.

## 2023-04-23 NOTE — Telephone Encounter (Signed)
   Name: Sandra [name]  DOB: 1975/08/02  MRN: [id number]  Primary Cardiologist: Charlton Haws, MD   Preoperative team, please contact this patient and set up a phone call appointment for further preoperative risk assessment. Please obtain consent and complete medication review. Thank you for your help.  I confirm that guidance regarding antiplatelet and oral anticoagulation therapy has been completed and, if necessary, noted below.  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 If bleeding risk too high, can hold for 5 days.  I also confirmed the patient resides in the state of [place]. As per [facility] Medical Board telemedicine laws, the patient must reside in the state in which the provider is licensed.   Marcelino Duster, PA 04/23/2023, 1:34 PM [facility]

## 2023-04-23 NOTE — Progress Notes (Signed)
 Patient's chart reviewed with Dr Jean Rosenthal, [place] for [facility] without further cardiac workup or clearances. She had surgery 03-16-23 with cards clearance at that time.

## 2023-04-27 ENCOUNTER — Ambulatory Visit: Payer: MEDICAID | Attending: Cardiology | Admitting: Emergency Medicine

## 2023-04-27 DIAGNOSIS — Z01810 Encounter for preprocedural cardiovascular examination: Secondary | ICD-10-CM | POA: Diagnosis not present

## 2023-04-27 NOTE — Progress Notes (Signed)
 Virtual Visit via Telephone Note   Because of Sandra [name] co-morbid illnesses, she is at least at moderate risk for complications without adequate follow up.  This format is felt to be most appropriate for this patient at this time.  Due to technical limitations with video connection (technology), today's appointment will be conducted as an audio only telehealth visit, and Sandra [name] verbally agreed to proceed in this manner.   All issues noted in this document were discussed and addressed.  No physical exam could be performed with this format.  Evaluation Performed:  Preoperative cardiovascular risk assessment _____________   Date:  04/27/2023   Patient ID:  Sandra [name], DOB Sep 17, 1975, MRN [id number] Patient Location:  Home Provider location:   Office  Primary Care Provider:  Oneal Grout, FNP Primary Cardiologist:  Charlton Haws, MD  Chief Complaint / Patient Profile   48 y.o. y/o female with a h/o coronary artery disease, dilated cardiomyopathy, hypertension, hyperlipidemia, diabetes mellitus, ADHD, bipolar disorder, bladder cancer, GERD, former tobacco abuse who is pending left second hammertoe correction with PIP fusion, possible pinning, possible second metatarsal will ostomy versus met head resection, foot callus debridement with [facility] by Dr. Odis Hollingshead on 05/03/2023 and presents today for telephonic preoperative cardiovascular risk assessment.  History of Present Illness    Sandra [name] is a 48 y.o. female who presents via audio/video conferencing for a telehealth visit today.  Pt was last seen in cardiology clinic on 11/18/2022 by Eligha Bridegroom, NP.  At that time Sandra [name] was doing well.  The patient is now pending procedure as outlined above. Since her last visit, she denies chest pain, shortness of breath, lower extremity edema, fatigue, palpitations, melena, hematuria, hemoptysis, diaphoresis, weakness, presyncope, syncope, orthopnea, and  PND.  Today and since last office visit she is doing well overall.  She denies any acute cardiovascular concerns or complaints.  She does stay relatively active however limited due to chronic back pain.  She denies any anginal symptoms.  She is without any exertional angina or dyspnea.  Past Medical History    Past Medical History:  Diagnosis Date   ADHD (attention deficit hyperactivity disorder)    Allergic rhinitis    Anemia    hx of   Anxiety    Arthritis    Barrett's esophagus    Bipolar 1 disorder (HCC)    Bladder tumor    CAD (coronary artery disease)    Cancer (HCC)    bladder   Constipation    Depression    Dilated cardiomyopathy (HCC) 12/20/2020   Echo 6/22 ([facility]): EF 43, mild MR, mild TR // Echo 3/22: EF 50-55   Dizziness    Full dentures    GERD (gastroesophageal reflux disease)    Gross hematuria    History of kidney stones    Hyperlipidemia    Hypertension    Stable angina (HCC)    Type 2 diabetes mellitus (HCC)    Urgency of urination    dysuria, sui   Wears glasses    Past Surgical History:  Procedure Laterality Date   ABDOMINAL HYSTERECTOMY     ANTERIOR CERVICAL DECOMP/DISCECTOMY FUSION N/A 03/05/2022   Procedure: ANTERIOR CERVICAL DECOMPRESSION/DISCECTOMY FUSION 1 LEVEL; C6-C7;  Surgeon: Venita Lick, MD;  [facility]: [facility];  Service: Orthopedics;  Laterality: N/A;  3 hrs 3 C-Bed   ANTERIOR INTEROSSEOUS NERVE DECOMPRESSION Right 03/16/2023   Procedure: Right elbow ulnar nerve release with anterior transposition and flexor pronator lengthening  as necessary.;  Surgeon: Ronn Cohn, MD;  Location: [facility] OR;  Service: Orthopedics;  Laterality: Right;  90 mins   BALLOON DILATION N/A 02/17/2021   Procedure: BALLOON DILATION;  Surgeon: Vinetta Greening, DO;  [facility];  Service: Endoscopy;  Laterality: N/A;   BIOPSY  02/17/2021   Procedure: BIOPSY;  Surgeon: Vinetta Greening, DO;  [facility];  Service: Endoscopy;;   COLONOSCOPY  WITH PROPOFOL N/A 02/17/2021   Surgeon: Vinetta Greening, DO; nonbleeding internal hemorrhoids, otherwise normal exam.  Repeat in 10 years.   CYSTOSCOPY N/A 10/03/2015   Procedure: CYSTOSCOPY;  Surgeon: Homero Luster, MD;  Location: [facility];  Service: Urology;  Laterality: N/A;   CYSTOSCOPY W/ URETERAL STENT PLACEMENT Right 07/24/2021   Procedure: CYSTOSCOPY WITH RETROGRADE PYELOGRAM/URETERAL STENT PLACEMENT;  Surgeon: Marco Severs, MD;  [facility]: [facility];  Service: Urology;  Laterality: Right;   CYSTOSCOPY WITH RETROGRADE PYELOGRAM, URETEROSCOPY AND STENT PLACEMENT Right 08/14/2021   Procedure: CYSTOSCOPY WITH RETROGRADE PYELOGRAM, URETEROSCOPY AND STENT EXCHANGE;  Surgeon: Marco Severs, MD;  Location: [facility];  Service: Urology;  Laterality: Right;   CYSTOSCOPY WITH STENT PLACEMENT Right 10/31/2015   Procedure: CYSTOSCOPY WITH ATTEMPTED RIGHT URETERAL OPENING;  Surgeon: Homero Luster, MD;  Location: [facility];  Service: Urology;  Laterality: Right;   ESOPHAGOGASTRODUODENOSCOPY (EGD) WITH PROPOFOL N/A 02/17/2021   Surgeon: Vinetta Greening, DO; 2 cm hiatal hernia, Barrett's esophagus, gastritis with biopsies negative for H. pylori.  S/p empiric esophageal dilation.  Repeat in 3 years.   FOOT SURGERY Left    September 2022, broke 7 bones in left foot   INTERSTIM IMPLANT PLACEMENT N/A 03/14/2018   Procedure: Simona Dublin IMPLANT FIRST STAGE;  Surgeon: Marco Severs, MD;  [facility];  Service: Urology;  Laterality: N/A;   INTERSTIM IMPLANT PLACEMENT N/A 03/30/2018   Procedure: Simona [place] IMPLANT SECOND STAGE;  Surgeon: Marco Severs, MD;  [facility]: [facility];  Service: Urology;  Laterality: N/A;   INTERSTIM IMPLANT REVISION N/A 12/01/2021   Procedure: REVISION OF Simona Dublin- generator anf lead replacement;  Surgeon: Marco Severs, MD;  [facility];  Service: Urology;  Laterality: N/A;   LEFT HEART CATH AND CORONARY ANGIOGRAPHY N/A  05/31/2020   Procedure: LEFT HEART CATH AND CORONARY ANGIOGRAPHY;  Surgeon: Millicent Ally, MD;  [facility];  Service: Cardiovascular;  Laterality: N/A;   MULTIPLE TOOTH EXTRACTIONS  2015   OVARIAN CYST REMOVAL Right 2004 approx   SHOULDER OPEN ROTATOR CUFF REPAIR Left 04/20/2019   Procedure: ROTATOR CUFF REPAIR SHOULDER OPEN;  Surgeon: Darrin Emerald, MD;  Location: [facility];  Service: Orthopedics;  Laterality: Left;   TONSILLECTOMY  12/30/2004   TOTAL ABDOMINAL HYSTERECTOMY W/ BILATERAL SALPINGOOPHORECTOMY  05/16/2004   TRANSURETHRAL RESECTION OF BLADDER TUMOR N/A 10/03/2015   Procedure: TRANSURETHRAL RESECTION OF BLADDER TUMOR (TURBT);  Surgeon: Homero Luster, MD;  Location: [facility];  Service: Urology;  Laterality: N/A;   TRANSURETHRAL RESECTION OF BLADDER TUMOR N/A 10/31/2015   Procedure: RE-STAGINGG TRANSURETHRAL RESECTION OF BLADDER TUMOR (TURBT);  Surgeon: Homero Luster, MD;  Location: [facility];  Service: Urology;  Laterality: N/A;   TRANSURETHRAL RESECTION OF BLADDER TUMOR N/A 08/17/2016   Procedure: TRANSURETHRAL RESECTION OF BLADDER TUMOR (TURBT);  Surgeon: Marco Severs, MD;  [facility];  Service: Urology;  Laterality: N/A;   URETERAL BIOPSY Right 08/14/2021   Procedure: URETERAL BIOPSY;  Surgeon: Marco Severs, MD;  [facility];  Service: Urology;  Laterality: Right;    Allergies  Allergies  Allergen Reactions   Ibuprofen Hives and Itching   Benazepril Hives   Diphenhydramine Hives   Latex Itching and Dermatitis    States she had redness to skin and itching    Metronidazole Nausea And Vomiting   Oxycodone     Pt has tolerated hydromorphone in the past and takes Norco at home.   Toradol [Ketorolac Tromethamine] Itching   Zofran [Ondansetron Hcl] Hives   Adhesive [Tape] Rash   Codeine Hives, Nausea And Vomiting and Rash   Loratadine Rash   Nystatin-Triamcinolone Rash   Penicillins Nausea Only and  Rash    Received ancef w/o issue (7/23)    Home Medications    Prior to Admission medications   Medication Sig Start Date End Date Taking? Authorizing Provider  ACCU-CHEK GUIDE test strip USE AS INSTRUCTED TO MONITOR GLUCOSE 4 TIMES DAILY BEFORE MEALS AND BEFORE BED. 04/29/21   Wendel Hals, NP  Accu-Chek Softclix Lancets lancets Use as instructed to monitor glucose 4 times daily 07/10/20   Wendel Hals, NP  amLODipine (NORVASC) 5 MG tablet Take 2 tablets (10 mg total) by mouth daily. 04/23/22   Ninetta Basket, MD  aspirin EC 81 MG tablet Take 1 tablet (81 mg total) by mouth daily with breakfast. Swallow whole. 07/24/21   Colin Dawley, MD  Blood Glucose Monitoring Suppl (ACCU-CHEK GUIDE) w/Device KIT 1 Piece by Does not apply route as directed. 04/15/20   Wendel Hals, NP  Blood Pressure Monitoring (BLOOD PRESSURE CUFF) MISC See admin instructions.    [provider]  budeson-glycopyrrolate-formoterol (BREZTRI AEROSPHERE) 160-9-4.8 MCG/ACT AERO Take 2 puffs first thing in am and then another 2 puffs about 12 hours later. 04/05/23   Wert, Michael B, MD  buPROPion (WELLBUTRIN XL) 300 MG 24 hr tablet Take 300 mg by mouth every morning. 01/18/20   [provider]  cariprazine (VRAYLAR) 3 MG capsule Take 3 mg by mouth at bedtime. 10/29/22   [provider]  cloNIDine HCl (KAPVAY) 0.1 MG TB12 ER tablet Take 0.1 mg by mouth at bedtime.    [provider]  Continuous Glucose Sensor (DEXCOM G7 SENSOR) MISC Inject 1 Application into the skin as directed. Change sensor every 10 days as directed. Patient not taking: Reported on 04/23/2023 12/18/22   Wendel Hals, NP  cyclobenzaprine (FLEXERIL) 10 MG tablet Take 1 tablet (10 mg total) by mouth 3 (three) times daily as needed for muscle spasms. 03/05/22   Mort Ards, MD  Evolocumab (REPATHA SURECLICK) 140 MG/ML SOAJ INJECT 1 DOSE INTO THE SKIN EVERY 14 DAYS. 10/23/22   Nishan, Peter C, MD   fenofibrate (TRICOR) 145 MG tablet take 1 tablet once daily. 04/16/23   Swinyer, Leilani Punter, NP  FLUoxetine (PROZAC) 40 MG capsule Take 40 mg by mouth in the morning. 04/02/20   [provider]  fluticasone (FLONASE) 50 MCG/ACT nasal spray Place 2 sprays into both nostrils daily. 06/15/22   Wendel Hals, NP  gabapentin (NEURONTIN) 800 MG tablet Take 800 mg by mouth 3 (three) times daily.    [provider]  GLOBAL EASE INJECT PEN NEEDLES 31G X 5 MM MISC Inject into the skin. 03/13/21   [provider]  HYDROcodone-acetaminophen (NORCO) 10-325 MG tablet Take 1 tablet by mouth. One tablet four times a day 03/23/22   [provider]  insulin aspart (NOVOLOG) 100 UNIT/ML injection Use with Omnipod  for TDD around 150 units daily 12/18/22   Wendel Hals, NP  Insulin Disposable Pump (OMNIPOD DASH PDM, GEN 4,) KIT Change pod every 48-72 hours 04/23/22   Wendel Hals, NP  Insulin Disposable Pump (OMNIPOD DASH PODS, GEN 4,) MISC CHANGE POD EVERY 48 HOURS AS DIRECTED 04/12/23   Wendel Hals, NP  insulin glargine (LANTUS) 100 UNIT/ML injection Inject into the skin as needed (Back up if needed).    [provider]  Insulin Pen Needle (PEN NEEDLES) 31G X 6 MM MISC Use to inject insulin 4 times daily 02/24/21   Wendel Hals, NP  INSULIN SYRINGE 1CC/29G 29G X 1/2" 1 ML MISC Use to inject insulin into Omnipod 08/05/21   Wendel Hals, NP  isosorbide mononitrate (IMDUR) 30 MG 24 hr tablet Take 1 tablet (30 mg total) by mouth daily. 04/17/22   Nishan, Peter C, MD  lisdexamfetamine (VYVANSE) 60 MG capsule Take 60 mg by mouth every morning. 03/26/23   [provider]  meclizine (ANTIVERT) 25 MG tablet Take 25 mg by mouth 4 (four) times a week. 09/09/22   [provider]  metoprolol succinate (TOPROL-XL) 50 MG 24 hr tablet Take 1 tablet (50 mg total) by mouth daily. Take with or immediately following a meal. 04/17/22   Nishan, Peter C, MD   nitroGLYCERIN (NITROSTAT) 0.4 MG SL tablet PLACE 1 TAB UNDER TONGUE EVERY 3 MINUTES AS DIRECTED UP TO 3 TIMES AS NEEDED FOR CHEST PAIN. Patient taking differently: Place 0.4 mg under the tongue every 5 (five) minutes as needed for chest pain. 02/16/22   Marlyse Single T, PA-C  pantoprazole (PROTONIX) 40 MG tablet take 1 tablet twice daily before a meal 08/30/22   Evander Hills, PA-C  rosuvastatin (CRESTOR) 40 MG tablet Take 1 tablet (40 mg total) by mouth daily. Patient taking differently: Take 20 mg by mouth daily. 06/24/21   Hilty, Aviva Lemmings, MD  scopolamine (TRANSDERM-SCOP) 1 MG/3DAYS Place 1 patch onto the skin every 3 (three) days.    [provider]  tamsulosin (FLOMAX) 0.4 MG CAPS capsule TAKE 1 CAPSULE BY MOUTH AT BEDTIME. 08/31/22   McKenzie, Arden Beck, MD  Vitamin D, Ergocalciferol, (DRISDOL) 1.25 MG (50000 UNIT) CAPS capsule Take 50,000 Units by mouth once a week. 02/01/23   [provider]    Physical Exam    Vital Signs:  Sandra [name] does not have vital signs available for review today.  Given telephonic nature of communication, physical exam is limited. AAOx3. NAD. Normal affect.  Speech and respirations are unlabored.  Accessory Clinical Findings    None  Assessment & Plan    1.  Preoperative Cardiovascular Risk Assessment: According to the Revised Cardiac Risk Index (RCRI), her Perioperative Risk of Major Cardiac Event is (%): 11. Her Functional Capacity in METs is: 5.81 according to the [facility] Activity Status Index (DASI). Therefore, based on ACC/AHA guidelines, patient would be at acceptable risk for the planned procedure without further cardiovascular testing.   The patient was advised that if she develops new symptoms prior to surgery to contact our office to arrange for a follow-up visit, and she verbalized understanding.  Regarding ASA therapy, we recommend continuation of ASA throughout the perioperative period.  However, if the surgeon feels that  cessation of ASA is required in the perioperative period, it may be stopped 5-7 days prior to surgery with a plan to resume it as soon as felt to be feasible from a surgical standpoint  in the post-operative period.   A copy of this note will be routed to requesting surgeon.  Time:   Today, I have spent 7 minutes with the patient with telehealth technology discussing medical history, symptoms, and management plan.     Ava Boatman, NP  04/27/2023, 7:46 AM

## 2023-04-29 ENCOUNTER — Encounter (HOSPITAL_BASED_OUTPATIENT_CLINIC_OR_DEPARTMENT_OTHER)
Admission: RE | Admit: 2023-04-29 | Discharge: 2023-04-29 | Disposition: A | Discharge status: Home / Self Care | Payer: MEDICAID | Source: Ambulatory Visit | Attending: Orthopaedic Surgery | Admitting: Orthopaedic Surgery

## 2023-04-29 DIAGNOSIS — E1142 Type 2 diabetes mellitus with diabetic polyneuropathy: Secondary | ICD-10-CM | POA: Insufficient documentation

## 2023-04-29 DIAGNOSIS — Z01818 Encounter for other preprocedural examination: Secondary | ICD-10-CM | POA: Diagnosis present

## 2023-04-29 DIAGNOSIS — Z794 Long term (current) use of insulin: Secondary | ICD-10-CM | POA: Insufficient documentation

## 2023-04-29 LAB — BASIC METABOLIC PANEL WITH GFR
Anion gap: 12 (ref 5–15)
BUN: 10 mg/dL (ref 6–20)
CO2: 23 mmol/L (ref 22–32)
Calcium: 9.1 mg/dL (ref 8.9–10.3)
Chloride: 102 mmol/L (ref 98–111)
Creatinine, Ser: 0.95 mg/dL (ref 0.44–1.00)
GFR, Estimated: >60 mL/min (ref >60)
Glucose, Bld: 206 mg/dL — ABNORMAL HIGH (ref 70–99)
Potassium: 4.4 mmol/L (ref 3.5–5.1)
Sodium: 137 mmol/L (ref 135–145)

## 2023-04-29 NOTE — Progress Notes (Signed)

## 2023-05-01 NOTE — H&P (Signed)
 ORTHOPAEDIC SURGERY H&P  Subjective:  The patient presents with left 2nd metatarsalgia.   Past Medical History:  Diagnosis Date   ADHD (attention deficit hyperactivity disorder)    Allergic rhinitis    Anemia    hx of   Anxiety    Arthritis    Barrett's esophagus    Bipolar 1 disorder (HCC)    Bladder tumor    CAD (coronary artery disease)    Cancer (HCC)    bladder   Constipation    Depression    Dilated cardiomyopathy (HCC) 12/20/2020   Echo 6/22 ([facility]): EF 43, mild MR, mild TR // Echo 3/22: EF 50-55   Dizziness    Full dentures    GERD (gastroesophageal reflux disease)    Gross hematuria    History of kidney stones    Hyperlipidemia    Hypertension    Stable angina (HCC)    Type 2 diabetes mellitus (HCC)    Urgency of urination    dysuria, sui   Wears glasses     Past Surgical History:  Procedure Laterality Date   ABDOMINAL HYSTERECTOMY     ANTERIOR CERVICAL DECOMP/DISCECTOMY FUSION N/A 03/05/2022   Procedure: ANTERIOR CERVICAL DECOMPRESSION/DISCECTOMY FUSION 1 LEVEL; C6-C7;  Surgeon: Mort Ards, MD;  Location: [facility];  Service: Orthopedics;  Laterality: N/A;  3 hrs 3 C-Bed   ANTERIOR INTEROSSEOUS NERVE DECOMPRESSION Right 03/16/2023   Procedure: Right elbow ulnar nerve release with anterior transposition and flexor pronator lengthening as necessary.;  Surgeon: Ronn Cohn, MD;  Location: [facility];  Service: Orthopedics;  Laterality: Right;  90 mins   BALLOON DILATION N/A 02/17/2021   Procedure: BALLOON DILATION;  Surgeon: Vinetta Greening, DO;  [facility];  Service: Endoscopy;  Laterality: N/A;   BIOPSY  02/17/2021   Procedure: BIOPSY;  Surgeon: Vinetta Greening, DO;  [facility];  Service: Endoscopy;;   COLONOSCOPY WITH PROPOFOL  N/A 02/17/2021   Surgeon: Vinetta Greening, DO; nonbleeding internal hemorrhoids, otherwise normal exam.  Repeat in 10 years.   CYSTOSCOPY N/A 10/03/2015   Procedure: CYSTOSCOPY;  Surgeon: Homero Luster, MD;   Location: [facility];  Service: Urology;  Laterality: N/A;   CYSTOSCOPY W/ URETERAL STENT PLACEMENT Right 07/24/2021   Procedure: CYSTOSCOPY WITH RETROGRADE PYELOGRAM/URETERAL STENT PLACEMENT;  Surgeon: Marco Severs, MD;  [facility];  Service: Urology;  Laterality: Right;   CYSTOSCOPY WITH RETROGRADE PYELOGRAM, URETEROSCOPY AND STENT PLACEMENT Right 08/14/2021   Procedure: CYSTOSCOPY WITH RETROGRADE PYELOGRAM, URETEROSCOPY AND STENT EXCHANGE;  Surgeon: Marco Severs, MD;  Location: [facility];  Service: Urology;  Laterality: Right;   CYSTOSCOPY WITH STENT PLACEMENT Right 10/31/2015   Procedure: CYSTOSCOPY WITH ATTEMPTED RIGHT URETERAL OPENING;  Surgeon: Homero Luster, MD;  [facility];  Service: Urology;  Laterality: Right;   ESOPHAGOGASTRODUODENOSCOPY (EGD) WITH PROPOFOL  N/A 02/17/2021   Surgeon: Vinetta Greening, DO; 2 cm hiatal hernia, Barrett's esophagus, gastritis with biopsies negative for H. pylori.  S/p empiric esophageal dilation.  Repeat in 3 years.   FOOT SURGERY Left    September 2022, broke 7 bones in left foot   INTERSTIM IMPLANT PLACEMENT N/A 03/14/2018   Procedure: Simona Dublin IMPLANT FIRST STAGE;  Surgeon: Marco Severs, MD;  [facility];  Service: Urology;  Laterality: N/A;   INTERSTIM IMPLANT PLACEMENT N/A 03/30/2018   Procedure: Simona Dublin IMPLANT SECOND STAGE;  Surgeon: Marco Severs, MD;  [facility];  Service: Urology;  Laterality: N/A;  INTERSTIM IMPLANT REVISION N/A 12/01/2021   Procedure: REVISION OF Simona Dublin- generator anf lead replacement;  Surgeon: Marco Severs, MD;  [facility];  Service: Urology;  Laterality: N/A;   LEFT HEART CATH AND CORONARY ANGIOGRAPHY N/A 05/31/2020   Procedure: LEFT HEART CATH AND CORONARY ANGIOGRAPHY;  Surgeon: Millicent Ally, MD;  [facility];  Service: Cardiovascular;  Laterality: N/A;   MULTIPLE TOOTH EXTRACTIONS  2015   OVARIAN CYST  REMOVAL Right 2004 approx   SHOULDER OPEN ROTATOR CUFF REPAIR Left 04/20/2019   Procedure: ROTATOR CUFF REPAIR SHOULDER OPEN;  Surgeon: Darrin Emerald, MD;  Location: [facility];  Service: Orthopedics;  Laterality: Left;   TONSILLECTOMY  12/30/2004   TOTAL ABDOMINAL HYSTERECTOMY W/ BILATERAL SALPINGOOPHORECTOMY  05/16/2004   TRANSURETHRAL RESECTION OF BLADDER TUMOR N/A 10/03/2015   Procedure: TRANSURETHRAL RESECTION OF BLADDER TUMOR (TURBT);  Surgeon: Homero Luster, MD;  Location: [facility];  Service: Urology;  Laterality: N/A;   TRANSURETHRAL RESECTION OF BLADDER TUMOR N/A 10/31/2015   Procedure: RE-STAGINGG TRANSURETHRAL RESECTION OF BLADDER TUMOR (TURBT);  Surgeon: Homero Luster, MD;  Location: [facility];  Service: Urology;  Laterality: N/A;   TRANSURETHRAL RESECTION OF BLADDER TUMOR N/A 08/17/2016   Procedure: TRANSURETHRAL RESECTION OF BLADDER TUMOR (TURBT);  Surgeon: Marco Severs, MD;  [facility];  Service: Urology;  Laterality: N/A;   URETERAL BIOPSY Right 08/14/2021   Procedure: URETERAL BIOPSY;  Surgeon: Marco Severs, MD;  Location[facility];  Service: Urology;  Laterality: Right;     (Not in an outpatient encounter)    Allergies  Allergen Reactions   Ibuprofen Hives and Itching   Benazepril Hives   Diphenhydramine  Hives   Latex Itching and Dermatitis    States she had redness to skin and itching    Metronidazole  Nausea And Vomiting   Oxycodone      Pt has tolerated hydromorphone  in the past and takes Norco at home.   Toradol  [Ketorolac  Tromethamine ] Itching   Zofran  [Ondansetron  Hcl] Hives   Adhesive [Tape] Rash   Codeine Hives, Nausea And Vomiting and Rash   Loratadine  Rash   Nystatin -Triamcinolone  Rash   Penicillins Nausea Only and Rash    Received ancef  w/o issue (7/23)    Social History   Socioeconomic History   Marital status: Married    Spouse name: Not on file   Number of children: Not on file   Years of  education: Not on file   Highest education level: Not on file  Occupational History   Not on file  Tobacco Use   Smoking status: Former    Current packs/day: 0.00    Average packs/day: 0.5 packs/day for 10.0 years (5.0 ttl pk-yrs)    Types: Cigarettes    Start date: 02/01/2011    Quit date: 01/31/2021    Years since quitting: 2.2   Smokeless tobacco: Never   Tobacco comments:    02/06/21-no smoking past 5 days  Vaping Use   Vaping status: Never Used  Substance and Sexual Activity   Alcohol use: No   Drug use: No   Sexual activity: Not Currently    Birth control/protection: Surgical    Comment: hyst  Other Topics Concern   Not on file  Social History Narrative   Are you right handed or left handed? Left Handed    Are you currently employed ? No    What is your current occupation?   Do you live at home alone? No  Who lives with you? Lives with daughter    What type of home do you live in: 1 story or 2 story? Lives in a trailer        Social Drivers of Health   Financial Resource Strain: High Risk (11/19/2022)   Overall Financial Resource Strain (CARDIA)    Difficulty of Paying Living Expenses: Hard  Food Insecurity: Food Insecurity Present (11/19/2022)   Hunger Vital Sign    Worried About Running Out of Food in the Last Year: Sometimes true    Ran Out of Food in the Last Year: Never true  Transportation Needs: No Transportation Needs (11/19/2022)   PRAPARE - Administrator, Civil Service (Medical): No    Lack of Transportation (Non-Medical): No  Physical Activity: Not on file  Stress: Not on file  Social Connections: Unknown (05/27/2021)   [facility] from [facility]   Social Network    Social Network: Not on file  Intimate Partner Violence: Unknown (04/18/2021)   Received from [facility]   HITS    Physically Hurt: Not on file    Insult or Talk Down To: Not on file    Threaten Physical Harm: Not on file    Scream or Curse:  Not on file     History reviewed. No pertinent family history.   Review of Systems Pertinent items are noted in HPI.  Objective: Vital signs in last 24 hours:    04/23/2023    3:36 PM 04/05/2023    1:34 PM 03/16/2023    5:45 PM  Vitals with BMI  Height 4\' 11"  5\' 0"    Weight 184 lbs 1 oz 184 lbs   BMI 37.16 35.94   Systolic  131 180  Diastolic  83 84  Pulse  104 81      EXAM: General: Well nourished, well developed. Awake, alert and oriented to time, place, person. Normal mood and affect. No apparent distress. Breathing room air.  Operative Lower Extremity: Alignment - Neutral Deformity - None Skin intact Tenderness to palpation - left 2nd toe 5/5 TA, PT, GS, Per, EHL, FHL Sensation intact to light touch throughout Palpable DP and PT pulses Special testing: None  The contralateral foot/ankle was examined for comparison and noted to be neurovascularly intact with no localized deformity, swelling, or tenderness.  Imaging Review All images taken were independently reviewed by me.  Assessment/Plan: The clinical and radiographic findings were reviewed and discussed at length with the patient.  The patient has left 2nd toe metatarsalgia.  We spoke at length about the natural course of these findings. We discussed nonoperative and operative treatment options in detail.  The risks and benefits were presented and reviewed. The risks due to hardware failure/irritation, new/persistent/recurrent infection, stiffness, nerve/vessel/tendon injury, nonunion/malunion of any fracture, wound healing issues, allograft usage, development of arthritis, failure of this surgery, possibility of external fixation in certain situations, possibility of delayed definitive surgery, need for further surgery, prolonged wound care including further soft tissue coverage procedures, thromboembolic events, anesthesia/medical complications/events perioperatively and beyond, amputation, death among others  were discussed. The patient acknowledged the explanation and agreed to proceed with the plan.  Ali Ink  Orthopaedic Surgery [facility]

## 2023-05-01 NOTE — Discharge Instructions (Addendum)
 Ali Ink, MD [facility]  Please read the following information regarding your care after surgery.  Medications  You only need a prescription for the narcotic pain medicine (ex. oxycodone , Percocet, Norco).  All of the other medicines listed below are available over the counter. ? Aleve  2 pills twice a day for the first 3 days after surgery. ? acetominophen (Tylenol ) 650 mg every 4-6 hours as you need for minor to moderate pain ? oxycodone  as prescribed for severe pain  ? To help prevent blood clots, take aspirin  (81 mg) twice daily for 28 days after surgery.  You should also get up every hour while you are awake to move around.  Weight Bearing ? Once your nerve block is completely worn off, OK to HEEL weightbear in postop shoe on the operated leg or foot. This means do NOT touch the front of your surgical leg to the ground!  Cast / Splint / Dressing ? If you have a dressing, do NOT remove this. Keep your splint, cast or dressing clean and dry.  Don't put anything (coat hanger, pencil, etc) down inside of it.  If it gets wet, call the office immediately to schedule an appointment for a cast change.  Swelling IMPORTANT: It is normal for you to have swelling where you had surgery. To reduce swelling and pain, keep at least 3 pillows under your leg so that your toes are above your nose and your heel is above the level of your hip.  It may be necessary to keep your foot or leg elevated for several weeks.  This is critical to helping your incisions heal and your pain to feel better.  Follow Up Call my office at [phone] when you are discharged from the hospital or surgery center to schedule an appointment to be seen 7-10 days after surgery.  Call my office at [phone] if you develop a fever >101.5 F, nausea, vomiting, bleeding from the surgical site or severe pain.     No Tylenol  before 1:00pm.  Post Anesthesia Home Care Instructions  Activity: Get plenty of rest for the  remainder of the day. A responsible individual must stay with you for 24 hours following the procedure.  For the next 24 hours, DO NOT: -Drive a car -Advertising copywriter -Drink alcoholic beverages -Take any medication unless instructed by your physician -Make any legal decisions or sign important papers.  Meals: Start with liquid foods such as gelatin or soup. Progress to regular foods as tolerated. Avoid greasy, spicy, heavy foods. If nausea and/or vomiting occur, drink only clear liquids until the nausea and/or vomiting subsides. Call your physician if vomiting continues.  Special Instructions/Symptoms: Your throat may feel dry or sore from the anesthesia or the breathing tube placed in your throat during surgery. If this causes discomfort, gargle with warm salt water . The discomfort should disappear within 24 hours.   Regional Anesthesia Blocks  1. You may not be able to move or feel the "blocked" extremity after a regional anesthetic block. This may last may last from 3-48 hours after placement, but it will go away. The length of time depends on the medication injected and your individual response to the medication. As the nerves start to wake up, you may experience tingling as the movement and feeling returns to your extremity. If the numbness and inability to move your extremity has not gone away after 48 hours, please call your surgeon.   2. The extremity that is blocked will need to be protected until the  numbness is gone and the strength has returned. Because you cannot feel it, you will need to take extra care to avoid injury. Because it may be weak, you may have difficulty moving it or using it. You may not know what position it is in without looking at it while the block is in effect.  3. For blocks in the legs and feet, returning to weight bearing and walking needs to be done carefully. You will need to wait until the numbness is entirely gone and the strength has returned. You should  be able to move your leg and foot normally before you try and bear weight or walk. You will need someone to be with you when you first try to ensure you do not fall and possibly risk injury.  4. Bruising and tenderness at the needle site are common side effects and will resolve in a few days.  5. Persistent numbness or new problems with movement should be communicated to the surgeon or the [id number] [facility] [phone] [facility] [phone]).

## 2023-05-03 ENCOUNTER — Encounter (HOSPITAL_BASED_OUTPATIENT_CLINIC_OR_DEPARTMENT_OTHER)
Admission: RE | Disposition: A | Discharge status: Home / Self Care | Payer: Self-pay | Source: Ambulatory Visit | Attending: Orthopaedic Surgery

## 2023-05-03 ENCOUNTER — Other Ambulatory Visit: Payer: Self-pay

## 2023-05-03 ENCOUNTER — Ambulatory Visit (HOSPITAL_COMMUNITY): Payer: MEDICAID

## 2023-05-03 ENCOUNTER — Ambulatory Visit (HOSPITAL_BASED_OUTPATIENT_CLINIC_OR_DEPARTMENT_OTHER): Payer: MEDICAID | Admitting: Anesthesiology

## 2023-05-03 ENCOUNTER — Ambulatory Visit (HOSPITAL_BASED_OUTPATIENT_CLINIC_OR_DEPARTMENT_OTHER)
Admission: RE | Admit: 2023-05-03 | Discharge: 2023-05-03 | Disposition: A | Discharge status: Home / Self Care | Payer: MEDICAID | Source: Ambulatory Visit | Attending: Orthopaedic Surgery | Admitting: Orthopaedic Surgery

## 2023-05-03 ENCOUNTER — Encounter (HOSPITAL_BASED_OUTPATIENT_CLINIC_OR_DEPARTMENT_OTHER): Payer: Self-pay | Admitting: Orthopaedic Surgery

## 2023-05-03 DIAGNOSIS — Z87891 Personal history of nicotine dependence: Secondary | ICD-10-CM

## 2023-05-03 DIAGNOSIS — M2042 Other hammer toe(s) (acquired), left foot: Secondary | ICD-10-CM

## 2023-05-03 DIAGNOSIS — I10 Essential (primary) hypertension: Secondary | ICD-10-CM | POA: Insufficient documentation

## 2023-05-03 DIAGNOSIS — E119 Type 2 diabetes mellitus without complications: Secondary | ICD-10-CM | POA: Diagnosis not present

## 2023-05-03 DIAGNOSIS — I2510 Atherosclerotic heart disease of native coronary artery without angina pectoris: Secondary | ICD-10-CM | POA: Insufficient documentation

## 2023-05-03 DIAGNOSIS — Z794 Long term (current) use of insulin: Secondary | ICD-10-CM | POA: Diagnosis not present

## 2023-05-03 DIAGNOSIS — M7742 Metatarsalgia, left foot: Secondary | ICD-10-CM | POA: Diagnosis present

## 2023-05-03 DIAGNOSIS — E669 Obesity, unspecified: Secondary | ICD-10-CM | POA: Insufficient documentation

## 2023-05-03 DIAGNOSIS — J45909 Unspecified asthma, uncomplicated: Secondary | ICD-10-CM | POA: Diagnosis not present

## 2023-05-03 DIAGNOSIS — Z6838 Body mass index (BMI) 38.0-38.9, adult: Secondary | ICD-10-CM | POA: Insufficient documentation

## 2023-05-03 DIAGNOSIS — K219 Gastro-esophageal reflux disease without esophagitis: Secondary | ICD-10-CM | POA: Diagnosis not present

## 2023-05-03 HISTORY — PX: WEIL OSTEOTOMY: SHX5044

## 2023-05-03 HISTORY — PX: TOE ARTHROPLASTY: SHX6504

## 2023-05-03 LAB — GLUCOSE, CAPILLARY
Glucose-Capillary: 138 mg/dL — ABNORMAL HIGH (ref 70–99)
Glucose-Capillary: 158 mg/dL — ABNORMAL HIGH (ref 70–99)

## 2023-05-03 SURGERY — ARTHROPLASTY, TOE
Anesthesia: Regional | Site: Toe | Laterality: Left

## 2023-05-03 MED ORDER — VANCOMYCIN HCL 500 MG IV SOLR
INTRAVENOUS | Status: DC | PRN
Start: 2023-05-03 — End: 2023-05-03
  Administered 2023-05-03: 500 mg via TOPICAL

## 2023-05-03 MED ORDER — MIDAZOLAM HCL 2 MG/2ML IJ SOLN
INTRAMUSCULAR | Status: AC
  Filled 2023-05-03: qty 2

## 2023-05-03 MED ORDER — VANCOMYCIN HCL 500 MG IV SOLR
INTRAVENOUS | Status: AC
  Filled 2023-05-03: qty 10

## 2023-05-03 MED ORDER — LIDOCAINE HCL (CARDIAC) PF 100 MG/5ML IV SOSY
PREFILLED_SYRINGE | INTRAVENOUS | Status: DC | PRN
  Administered 2023-05-03: 40 mg via INTRAVENOUS

## 2023-05-03 MED ORDER — FENTANYL CITRATE (PF) 100 MCG/2ML IJ SOLN
100.0000 ug | Freq: Once | INTRAMUSCULAR | Status: AC
  Administered 2023-05-03: 100 ug via INTRAVENOUS

## 2023-05-03 MED ORDER — ACETAMINOPHEN 500 MG PO TABS
ORAL_TABLET | ORAL | Status: AC
  Filled 2023-05-03: qty 2

## 2023-05-03 MED ORDER — CHLORHEXIDINE GLUCONATE 4 % EX SOLN: 60.0000 mL | Freq: Once | CUTANEOUS | Status: DC

## 2023-05-03 MED ORDER — CEFAZOLIN SODIUM-DEXTROSE 2-4 GM/100ML-% IV SOLN
2.0000 g | INTRAVENOUS | Status: AC
  Administered 2023-05-03: 2 g via INTRAVENOUS

## 2023-05-03 MED ORDER — PHENYLEPHRINE HCL (PRESSORS) 10 MG/ML IV SOLN
INTRAVENOUS | Status: DC | PRN
  Administered 2023-05-03 (×2): 80 ug via INTRAVENOUS
  Administered 2023-05-03: 100 ug via INTRAVENOUS

## 2023-05-03 MED ORDER — FENTANYL CITRATE (PF) 100 MCG/2ML IJ SOLN
INTRAMUSCULAR | Status: AC
  Filled 2023-05-03: qty 2

## 2023-05-03 MED ORDER — EPHEDRINE SULFATE (PRESSORS) 50 MG/ML IJ SOLN
INTRAMUSCULAR | Status: DC | PRN
  Administered 2023-05-03 (×3): 5 mg via INTRAVENOUS
  Administered 2023-05-03: 10 mg via INTRAVENOUS
  Administered 2023-05-03 (×2): 5 mg via INTRAVENOUS

## 2023-05-03 MED ORDER — ONDANSETRON HCL 4 MG/2ML IJ SOLN
INTRAMUSCULAR | Status: AC
  Filled 2023-05-03: qty 2

## 2023-05-03 MED ORDER — CEFAZOLIN SODIUM-DEXTROSE 2-4 GM/100ML-% IV SOLN
INTRAVENOUS | Status: AC
  Filled 2023-05-03: qty 100

## 2023-05-03 MED ORDER — PROPOFOL 10 MG/ML IV BOLUS
INTRAVENOUS | Status: DC | PRN
  Administered 2023-05-03: 200 mg via INTRAVENOUS

## 2023-05-03 MED ORDER — ACETAMINOPHEN 500 MG PO TABS
1000.0000 mg | ORAL_TABLET | Freq: Once | ORAL | Status: AC
Start: 2023-05-03 — End: 2023-05-03
  Administered 2023-05-03: 1000 mg via ORAL

## 2023-05-03 MED ORDER — DEXAMETHASONE SODIUM PHOSPHATE 10 MG/ML IJ SOLN
INTRAMUSCULAR | Status: DC | PRN
  Administered 2023-05-03: 10 mg

## 2023-05-03 MED ORDER — EPHEDRINE 5 MG/ML INJ
INTRAVENOUS | Status: AC
  Filled 2023-05-03: qty 5

## 2023-05-03 MED ORDER — LACTATED RINGERS IV SOLN: INTRAVENOUS | Status: DC

## 2023-05-03 MED ORDER — LIDOCAINE 2% (20 MG/ML) 5 ML SYRINGE
INTRAMUSCULAR | Status: AC
  Filled 2023-05-03: qty 5

## 2023-05-03 MED ORDER — BUPIVACAINE-EPINEPHRINE (PF) 0.25% -1:200000 IJ SOLN
INTRAMUSCULAR | Status: AC
  Filled 2023-05-03: qty 30

## 2023-05-03 MED ORDER — PHENYLEPHRINE 80 MCG/ML (10ML) SYRINGE FOR IV PUSH (FOR BLOOD PRESSURE SUPPORT)
PREFILLED_SYRINGE | INTRAVENOUS | Status: AC
  Filled 2023-05-03: qty 10

## 2023-05-03 MED ORDER — DEXAMETHASONE SODIUM PHOSPHATE 10 MG/ML IJ SOLN
INTRAMUSCULAR | Status: AC
  Filled 2023-05-03: qty 1

## 2023-05-03 MED ORDER — MIDAZOLAM HCL 2 MG/2ML IJ SOLN
2.0000 mg | Freq: Once | INTRAMUSCULAR | Status: AC
  Administered 2023-05-03: 2 mg via INTRAVENOUS

## 2023-05-03 MED ORDER — ROPIVACAINE HCL 5 MG/ML IJ SOLN
INTRAMUSCULAR | Status: DC | PRN
  Administered 2023-05-03: 40 mL via PERINEURAL

## 2023-05-03 MED ORDER — 0.9 % SODIUM CHLORIDE (POUR BTL) OPTIME
TOPICAL | Status: DC | PRN
  Administered 2023-05-03: 200 mL

## 2023-05-03 MED ORDER — PROPOFOL 10 MG/ML IV BOLUS
INTRAVENOUS | Status: AC
Start: 2023-05-03 — End: ?
  Filled 2023-05-03: qty 20

## 2023-05-03 MED ORDER — POVIDONE-IODINE 10 % EX SOLN
CUTANEOUS | Status: DC | PRN
  Administered 2023-05-03: 1 via TOPICAL

## 2023-05-03 SURGICAL SUPPLY — 52 items
BANDAGE ESMARK 6X9 LF (GAUZE/BANDAGES/DRESSINGS) IMPLANT
BIT DRILL 1.8 CANN MAX VPC (BIT) IMPLANT
BLADE AVERAGE 25X9 (BLADE) IMPLANT
BLADE PRESCISION 7.0X.51X18.5 (BLADE) IMPLANT
BLADE SURG 15 STRL LF DISP TIS (BLADE) ×4 IMPLANT
BNDG COHESIVE 4X5 TAN STRL LF (GAUZE/BANDAGES/DRESSINGS) ×2 IMPLANT
BNDG ELASTIC 4INX 5YD STR LF (GAUZE/BANDAGES/DRESSINGS) ×2 IMPLANT
BNDG GAUZE DERMACEA FLUFF 4 (GAUZE/BANDAGES/DRESSINGS) ×2 IMPLANT
CANISTER SUCT 1200ML W/VALVE (MISCELLANEOUS) ×2 IMPLANT
CHLORAPREP W/TINT 26 (MISCELLANEOUS) ×2 IMPLANT
COVER BACK TABLE 60X90IN (DRAPES) ×2 IMPLANT
CUFF TRNQT CYL 34X4.125X (TOURNIQUET CUFF) ×2 IMPLANT
DRAPE C-ARM 42X72 X-RAY (DRAPES) ×2 IMPLANT
DRAPE C-ARMOR (DRAPES) ×2 IMPLANT
DRAPE EXTREMITY T 121X128X90 (DISPOSABLE) ×2 IMPLANT
DRAPE IMP U-DRAPE 54X76 (DRAPES) ×2 IMPLANT
DRAPE U-SHAPE 47X51 STRL (DRAPES) ×2 IMPLANT
DRSG MEPITEL 4X7.2 (GAUZE/BANDAGES/DRESSINGS) ×2 IMPLANT
ELECTRODE REM PT RTRN 9FT ADLT (ELECTROSURGICAL) ×2 IMPLANT
GAUZE PAD ABD 8X10 STRL (GAUZE/BANDAGES/DRESSINGS) ×6 IMPLANT
GAUZE SPONGE 4X4 12PLY STRL (GAUZE/BANDAGES/DRESSINGS) ×2 IMPLANT
GLOVE BIOGEL PI IND STRL 8 (GLOVE) ×2 IMPLANT
GLOVE SURG SS PI 7.5 STRL IVOR (GLOVE) ×2 IMPLANT
GOWN STRL REUS W/ TWL LRG LVL3 (GOWN DISPOSABLE) ×4 IMPLANT
KWIRE COCR 0.9X95 (WIRE) IMPLANT
KWIRE COCR 1.1X105 (WIRE) IMPLANT
NDL HYPO 22X1.5 SAFETY MO (MISCELLANEOUS) IMPLANT
NEEDLE HYPO 22X1.5 SAFETY MO (MISCELLANEOUS) IMPLANT
NS IRRIG 1000ML POUR BTL (IV SOLUTION) ×2 IMPLANT
PACK BASIN DAY SURGERY FS (CUSTOM PROCEDURE TRAY) ×2 IMPLANT
PAD CAST 4YDX4 CTTN HI CHSV (CAST SUPPLIES) ×2 IMPLANT
PADDING CAST ABS COTTON 4X4 ST (CAST SUPPLIES) IMPLANT
PADDING CAST SYNTHETIC 4X4 STR (CAST SUPPLIES) IMPLANT
PADDING CAST SYNTHETIC 6X4 NS (CAST SUPPLIES) IMPLANT
PENCIL SMOKE EVACUATOR (MISCELLANEOUS) ×2 IMPLANT
SCREW MAX VPC 2.5X20 (Screw) IMPLANT
SHEET MEDIUM DRAPE 40X70 STRL (DRAPES) ×2 IMPLANT
SLEEVE SCD COMPRESS KNEE MED (STOCKING) ×2 IMPLANT
SPIKE FLUID TRANSFER (MISCELLANEOUS) IMPLANT
SPLINT FIBERGLASS 4X30 (CAST SUPPLIES) ×4 IMPLANT
SPONGE T-LAP 18X18 ~~LOC~~+RFID (SPONGE) ×2 IMPLANT
STOCKINETTE 6 STRL (DRAPES) ×2 IMPLANT
SUCTION TUBE FRAZIER 10FR DISP (SUCTIONS) IMPLANT
SUT ETHILON 2 0 FS 18 (SUTURE) ×2 IMPLANT
SUT MNCRL AB 3-0 PS2 18 (SUTURE) IMPLANT
SUT VIC AB 0 CT1 27XBRD ANBCTR (SUTURE) ×2 IMPLANT
SUT VIC AB 2-0 CT1 TAPERPNT 27 (SUTURE) ×2 IMPLANT
SUTURE FIBERWR #2 38 T-5 BLUE (SUTURE) IMPLANT
SYR BULB EAR ULCER 3OZ GRN STR (SYRINGE) ×2 IMPLANT
SYR CONTROL 10ML LL (SYRINGE) IMPLANT
TOWEL GREEN STERILE FF (TOWEL DISPOSABLE) ×4 IMPLANT
UNDERPAD 30X36 HEAVY ABSORB (UNDERPADS AND DIAPERS) ×2 IMPLANT

## 2023-05-03 NOTE — Progress Notes (Signed)
Assisted Dr. Finucane with left, adductor canal, popliteal, ultrasound guided block. Side rails up, monitors on throughout procedure. See vital signs in flow sheet. Tolerated Procedure well. 

## 2023-05-03 NOTE — Op Note (Signed)
 05/03/2023  12:53 PM   PATIENT: Sandra [name]  48 y.o. female  MRN: [id number]   PRE-OPERATIVE DIAGNOSIS:   Metatarsalgia of left foot   POST-OPERATIVE DIAGNOSIS:   Metatarsalgia of left foot   PROCEDURE: 1] Left 2nd hammertoe correction with PIP arthodesis 2] Left 2nd metatarsal head resection 3] Left 2nd toe extensor tendon lengthening   SURGEON:  Ali Ink, MD   ASSISTANT: None   ANESTHESIA: General, regional   EBL: Minimal   TOURNIQUET:    Total Tourniquet Time Documented: Thigh (Left) - 46 minutes Total: Thigh (Left) - 46 minutes    COMPLICATIONS: None apparent   DISPOSITION: Extubated, awake and stable to recovery.   INDICATION FOR PROCEDURE: The patient presented with above diagnosis.  We discussed the diagnosis, alternative treatment options, risks and benefits of the above surgical intervention, as well as alternative non-operative treatments. All questions/concerns were addressed and the patient/family demonstrated appropriate understanding of the diagnosis, the procedure, the postoperative course, and overall prognosis. The patient wished to proceed with surgical intervention and signed an informed surgical consent as such, in each others presence prior to surgery.   PROCEDURE IN DETAIL: After preoperative consent was obtained and the correct operative site was identified, the patient was brought to the operating room supine on stretcher and transferred onto operating table. General anesthesia was induced. Preoperative antibiotics were administered. Surgical timeout was taken. The patient was then positioned supine with an ipsilateral hip bump. The operative lower extremity was prepped and draped in standard sterile fashion with a tourniquet around the thigh. The extremity was exsanguinated and the tourniquet was inflated to 275 mmHg.  We can turned our attention to the hammer toe. Transverse incision was made over the apex of the  deformity, that is, the proximal interphalangeal joint. The prior callus was completely excised from this area, and dissection was carried down to the level of the joint. A saw blade was used to resect the articular portion of the proximal phalanx. Rongeur was used to prepare the base of the middle phalanx. A K wire was used to pin this toe in appropriate position and achieve compression across the PIP arthrodesis site. A [employer] VPC 2.5 mm x 20 mm screw was implanted over this wire. We then made a longitudinal incision over the MTP joint. Dissection was carried down to the level of the extensor digitorum longus tendon. This tendon was lengthened. We then carried dissection down to the metatarsal head. Capsulotomy was performed. A saw blade was used to perform 2nd metatarsal head resection.    Excellent perfusion of the hammer toe as well as the rest of the foot was noted once tourniquet was deflated.  The surgical sites were thoroughly irrigated. The tourniquet was deflated and hemostasis achieved. Betadine  and vancomycin  powder were applied. The deep layers were closed using 2-0 vicryl. The skin was closed without tension.    The leg was cleaned with saline and sterile dressings with gauze were applied. A well padded bulky wrap was applied. The patient was awakened from anesthesia and transported to the recovery room in stable condition.    FOLLOW UP PLAN: -transfer to PACU, then home -strict heel WB operative extremity once nerve block wears off, maximum elevation -maintain dry dressings until follow up -DVT ppx: Aspirin  81 mg twice daily -follow up as outpatient within 7-10 days for wound check -sutures out in 2-3 weeks in outpatient office   RADIOGRAPHS: AP, lateral, oblique radiographs of the left foot were obtained intraoperatively.  These showed interval 2nd hammertoe correction and 2nd metatarsal head resection with no plantar prominence. All hardware is appropriately positioned and  of the appropriate lengths. No other acute injuries are noted.   Ali Ink Orthopaedic Surgery [facility]

## 2023-05-03 NOTE — Anesthesia Procedure Notes (Signed)
 Anesthesia Regional Block: Adductor canal block   Pre-Anesthetic Checklist: , timeout performed,  Correct Patient, Correct Site, Correct Laterality,  Correct Procedure, Correct Position, site marked,  Risks and benefits discussed,  Surgical consent,  Pre-op evaluation,  At surgeon's request and post-op pain management  Laterality: Left  Prep: Maximum Sterile Barrier Precautions used, chloraprep       Needles:  Injection technique: Single-shot  Needle Type: Echogenic Stimulator Needle     Needle Length: 9cm  Needle Gauge: 22     Additional Needles:   Procedures:,,,, ultrasound used (permanent image in chart),,    Narrative:  Start time: 05/03/2023 7:05 AM End time: 05/03/2023 7:10 AM Injection made incrementally with aspirations every 5 mL.  Performed by: Personally  Anesthesiologist: [name], DO  Additional Notes: Monitors applied. No increased pain on injection. No increased resistance to injection. Injection made in 5cc increments. Good needle visualization. Patient tolerated procedure well.

## 2023-05-03 NOTE — Anesthesia Postprocedure Evaluation (Signed)
 Anesthesia Post Note  Patient: Sandra [name]  Procedure(s) Performed: 2nd metatarsal head resection (Left: Toe) OSTEOTOMY, WEIL (Left: Foot)     Patient location during evaluation: Phase II Anesthesia Type: Regional and General Level of consciousness: awake and alert, oriented and patient cooperative Pain management: pain level controlled Vital Signs Assessment: post-procedure vital signs reviewed and stable Respiratory status: spontaneous breathing, nonlabored ventilation and respiratory function stable Cardiovascular status: blood pressure returned to baseline and stable Postop Assessment: no apparent nausea or vomiting Anesthetic complications: no   No notable events documented.  Last Vitals:  Vitals:   05/03/23 0930 05/03/23 0945  BP: (!) 136/56 (!) 142/74  Pulse: 69 74  Resp: 18 15  Temp:    SpO2: 95% 95%    Last Pain:  Vitals:   05/03/23 0945  TempSrc:   PainSc: 0-No pain                 [name]

## 2023-05-03 NOTE — Anesthesia Preprocedure Evaluation (Addendum)
 Anesthesia Evaluation  Patient identified by MRN, date of birth, ID band Patient awake    Reviewed: Allergy & Precautions, H&P , NPO status , Patient's Chart, lab work & pertinent test results, reviewed documented beta blocker date and time   Airway Mallampati: III  TM Distance: >3 FB Neck ROM: Full    Dental  (+) Dental Advisory Given, Edentulous Upper, Edentulous Lower   Pulmonary asthma , former smoker Former smoker, quit 2023   Pulmonary exam normal breath sounds clear to auscultation       Cardiovascular hypertension (160/78 preop), Pt. on medications and Pt. on home beta blockers + CAD (nonobstructive on cath 2022)  Normal cardiovascular exam Rhythm:Regular Rate:Normal  Echo 2023 1. Septal hypokinesis inferior basal akinesis . Left ventricular ejection  fraction, by estimation, is 50 to 55%. The left ventricle has low normal  function. The left ventricle demonstrates regional wall motion  abnormalities (see scoring diagram/findings   for description). Left ventricular diastolic parameters were normal.   2. Right ventricular systolic function is normal. The right ventricular  size is normal.   3. The mitral valve is abnormal. Trivial mitral valve regurgitation. No  evidence of mitral stenosis.   4. The aortic valve is tricuspid. Aortic valve regurgitation is not  visualized. No aortic stenosis is present.   5. The inferior vena cava is normal in size with greater than 50%  respiratory variability, suggesting right atrial pressure of 3 mmHg.    Cath 2022  Prox RCA lesion is 30% stenosed.  RPDA lesion is 100% stenosed.  Prox Cx lesion is 40% stenosed.  1st Mrg lesion is 30% stenosed.  Mid Cx lesion is 20% stenosed.  2nd Mrg lesion is 60% stenosed.  Mid RCA to Dist RCA lesion is 50% stenosed.  Prox RCA to Mid RCA lesion is 70% stenosed.  Prox LAD lesion is 20% stenosed.  Ramus lesion is 30% stenosed.  Mid  LAD lesion is 20% stenosed.   Multivessel CAD with diffuse small vessels in this diabetic female.  The LAD has mild 20% areas of narrowing.  The ramus intermediate vessel has 30% narrowing.  The circumflex vessel has a percent proximal narrowing on a bend in the vessel and then has 20 to 30% stenoses.  There is focal 60% stenosis in the second marginal vessel.  From the left system there is collateralization to the very distal PDA.  The right coronary artery is diffusely small vessel in its mid segment with focal narrowing of 70% after the anterior RV marginal branch.  The mid -distal PDA is occluded and collateralized from the left circulation.   LVEDP 10 mmHg   RECOMMENDATION: Medical therapy for CAD with optimal blood pressure control, aggressive lipid management particularly with this patient with significant xanthelasmas on exam diabetic control.      Neuro/Psych  PSYCHIATRIC DISORDERS Anxiety Depression Bipolar Disorder      GI/Hepatic Neg liver ROS,GERD  Controlled,,  Endo/Other  diabetes, Well Controlled, Type 2, Insulin  Dependent  Obesity BMI 38 Last A1c 7.4, FS 138 in preop  Renal/GU negative Renal ROS Bladder dysfunction (hx bladder ca)      Musculoskeletal  (+) Arthritis , Osteoarthritis,  Chronic pain- hydrocodone  10mg  QID for back pain   Abdominal  (+) + obese  Peds negative pediatric ROS (+)  Hematology negative hematology ROS (+)   Anesthesia Other Findings   Reproductive/Obstetrics negative OB ROS  Anesthesia Physical Anesthesia Plan  ASA: 3  Anesthesia Plan: General and Regional   Post-op Pain Management: Regional block* and Tylenol  PO (pre-op)*   Induction: Intravenous  PONV Risk Score and Plan: 3 and Dexamethasone , Midazolam , Treatment may vary due to age or medical condition and Ondansetron   Airway Management Planned: LMA  Additional Equipment: None  Intra-op Plan:   Post-operative Plan:  Extubation in OR  Informed Consent: I have reviewed the patients History and Physical, chart, labs and discussed the procedure including the risks, benefits and alternatives for the proposed anesthesia with the patient or authorized representative who has indicated his/her understanding and acceptance.     Dental advisory given  Plan Discussed with: CRNA  Anesthesia Plan Comments:         Anesthesia Quick Evaluation

## 2023-05-03 NOTE — H&P (Signed)

## 2023-05-03 NOTE — Anesthesia Procedure Notes (Signed)
 Anesthesia Regional Block: Popliteal block   Pre-Anesthetic Checklist: , timeout performed,  Correct Patient, Correct Site, Correct Laterality,  Correct Procedure, Correct Position, site marked,  Risks and benefits discussed,  Surgical consent,  Pre-op evaluation,  At surgeon's request and post-op pain management  Laterality: Left  Prep: Maximum Sterile Barrier Precautions used, chloraprep       Needles:  Injection technique: Single-shot  Needle Type: Echogenic Stimulator Needle     Needle Length: 9cm  Needle Gauge: 22     Additional Needles:   Procedures:,,,, ultrasound used (permanent image in chart),,    Narrative:  Start time: 05/03/2023 7:00 AM End time: 05/03/2023 7:05 AM Injection made incrementally with aspirations every 5 mL.  Performed by: Personally  Anesthesiologist: [name], DO  Additional Notes: Monitors applied. No increased pain on injection. No increased resistance to injection. Injection made in 5cc increments. Good needle visualization. Patient tolerated procedure well.

## 2023-05-03 NOTE — Anesthesia Procedure Notes (Signed)
 Procedure Name: LMA Insertion Date/Time: 05/03/2023 7:46 AM  Performed by: Patt Boozer, CRNAPre-anesthesia Checklist: Patient identified, Emergency Drugs available, Suction available and Patient being monitored Patient Re-evaluated:Patient Re-evaluated prior to induction Oxygen  Delivery Method: Circle system utilized Preoxygenation: Pre-oxygenation with 100% oxygen  Induction Type: IV induction Ventilation: Mask ventilation without difficulty LMA: LMA inserted LMA Size: 4.0 Tube type: Oral Number of attempts: 1 Placement Confirmation: ETT inserted through vocal cords under direct vision, positive ETCO2 and breath sounds checked- equal and bilateral Tube secured with: Tape Dental Injury: Teeth and Oropharynx as per pre-operative assessment

## 2023-05-03 NOTE — Transfer of Care (Signed)
 Immediate Anesthesia Transfer of Care Note  Patient: Sandra [name]  Procedure(s) Performed: 2nd metatarsal head resection (Left: Toe) OSTEOTOMY, WEIL (Left: Foot)  Patient Location: PACU  Anesthesia Type:General  Level of Consciousness: awake, alert , oriented, and patient cooperative  Airway & Oxygen  Therapy: Patient Spontanous Breathing and Patient connected to face mask oxygen   Post-op Assessment: Report given to RN and Post -op Vital signs reviewed and stable  Post vital signs: Reviewed and stable  Last Vitals:  Vitals Value Taken Time  BP 136/56 05/03/23 0930  Temp 36.2 C 05/03/23 0901  Pulse 74 05/03/23 0944  Resp 15 05/03/23 0944  SpO2 95 % 05/03/23 0944  Vitals shown include unfiled device data.  Last Pain:  Vitals:   05/03/23 0915  TempSrc:   PainSc: 0-No pain      Patients Stated Pain Goal: 5 (05/03/23 1610)  Complications: No notable events documented.

## 2023-05-04 ENCOUNTER — Telehealth: Payer: Self-pay | Admitting: Nurse Practitioner

## 2023-05-04 ENCOUNTER — Encounter (HOSPITAL_BASED_OUTPATIENT_CLINIC_OR_DEPARTMENT_OTHER): Payer: Self-pay | Admitting: Orthopaedic Surgery

## 2023-05-04 MED ORDER — DEXCOM G7 SENSOR MISC: 1.0000 | 3 refills | Status: DC

## 2023-05-04 MED ORDER — DEXCOM G7 RECEIVER DEVI: 1.0000 | Freq: Once | 0 refills | Status: AC

## 2023-05-04 NOTE — Telephone Encounter (Signed)
 Pt said she still has not gotten her Dexcom. Can you send in her some strips for an accu chek guide to [facility]

## 2023-05-04 NOTE — Telephone Encounter (Signed)
 I have sent the script for Dexcom G7 sensor and receiver (just in case) to [facility].

## 2023-05-04 NOTE — Telephone Encounter (Signed)
 Spoke with pt, she is interested in upgrading her Dexcom CGM to the G7. Pt is using Omnipod for insulin  pump.

## 2023-05-17 NOTE — Progress Notes (Deleted)
 Sandra [name], female    DOB: Jan 02, 1976    MRN: [id number]   Brief patient profile:  48  yowf  quit smoking 01/2021/MM   and grew up in house lots of infections and need for inhalers at age 48  referred to pulmonary clinic in [place]  04/05/2023 by Torrence Freeze NP  for doe cough     Wt at d/c smoking  170  lb on prn inhaler  but doe gradually worse since then esp fall 2024    History of Present Illness  04/05/2023  Pulmonary/ 1st office eval/ Sandra [name] / [place] [facility] @ wt  184  Chief Complaint  Patient presents with   Establish Care  Dyspnea:  carrying grandson across the house [employer] once a month pushes cart x 3 aisles  Cough: on and off thick green assoc with nasal drainage middle of the day none  Sleep: bed is flat, 3 pillow s resp cc  SABA use: none  02: none  Rec Omnicef  300 mg twice daily x 10 day  Plan A = Automatic = Always=    Breztri  Take 2 puffs first thing in am and then another 2 puffs about 12 hours later.   Work on inhaler technique:  >>>  Remember how golfers warm up by taking practice swings - do this with an empty inhaler  Please remember to go to the lab department   for your tests - we will call you with the results when they are available.      My office will be contacting you by phone  [phone]- [phone])  for referral to lung cancer screening  and PFTs boh at [facility] > not done as of 05/18/2023  - if you don't hear back from my office within one week,  please call us  back or notify us  thru MyChart and we'll address it right away.   Please schedule a follow up office visit in 6 weeks, call sooner if needed with all medications /inhalers/ solutions in hand     Allergy screen 04/05/2023 >  Eos 0.2 /  IgE   alpha one AT phenotype  MM  156      05/18/2023  f/u ov/[place] [facility]/Sandra [name] re: *** maint on *** did *** bring meds  No chief complaint on file.   Dyspnea:  *** Cough: *** Sleeping: ***   resp cc  SABA use: *** 02: ***  Lung cancer screening:  ***   No obvious day to day or daytime variability or assoc excess/ purulent sputum or mucus plugs or hemoptysis or cp or chest tightness, subjective wheeze or overt sinus or hb symptoms.    Also denies any obvious fluctuation of symptoms with weather or environmental changes or other aggravating or alleviating factors except as outlined above   No unusual exposure hx or h/o childhood pna/ asthma or knowledge of premature birth.  Current Allergies, Complete Past Medical History, Past Surgical History, Family History, and Social History were reviewed in [employer] record.  ROS  The following are not active complaints unless bolded Hoarseness, sore throat, dysphagia, dental problems, itching, sneezing,  nasal congestion or discharge of excess mucus or purulent secretions, ear ache,   fever, chills, sweats, unintended wt loss or wt gain, classically pleuritic or exertional cp,  orthopnea pnd or arm/hand swelling  or leg swelling, presyncope, palpitations, abdominal pain, anorexia, nausea, vomiting, diarrhea  or change in bowel habits or change in bladder habits, change in stools or change in urine,  dysuria, hematuria,  rash, arthralgias, visual complaints, headache, numbness, weakness or ataxia or problems with walking or coordination,  change in mood or  memory.        No outpatient medications have been marked as taking for the 05/18/23 encounter (Appointment) with Diamond Formica, MD.                 Past Medical History:  Diagnosis Date   ADHD (attention deficit hyperactivity disorder)    Allergic rhinitis    Anemia    hx of   Anxiety    Arthritis    Barrett's esophagus    Bipolar 1 disorder (HCC)    Bladder tumor    CAD (coronary artery disease)    Cancer (HCC)    bladder   Constipation    Depression    Dilated cardiomyopathy (HCC) 12/20/2020   Echo 6/22 ([facility]): EF 43, mild MR, mild TR // Echo 3/22: EF 50-55   Dizziness    Full dentures    GERD  (gastroesophageal reflux disease)    Gross hematuria    History of kidney stones    Hyperlipidemia    Hypertension    Stable angina (HCC)    Type 2 diabetes mellitus (HCC)    Urgency of urination    dysuria, sui   Wears glasses       Objective:      05/18/2023         ***   05/03/23 189 lb 6 oz (85.9 kg)  04/05/23 184 lb (83.5 kg)  03/16/23 169 lb (76.7 kg)      Vital signs reviewed  05/18/2023  - Note at rest 02 sats  ***% on ***   General appearance:    ***     Min barr***          Assessment

## 2023-05-18 ENCOUNTER — Ambulatory Visit (HOSPITAL_COMMUNITY)
Admission: RE | Admit: 2023-05-18 | Discharge: 2023-05-18 | Disposition: A | Discharge status: Home / Self Care | Payer: MEDICAID | Source: Ambulatory Visit | Attending: Internal Medicine | Admitting: Internal Medicine

## 2023-05-18 ENCOUNTER — Ambulatory Visit: Payer: MEDICAID | Admitting: Internal Medicine

## 2023-05-18 DIAGNOSIS — J4489 Other specified chronic obstructive pulmonary disease: Secondary | ICD-10-CM | POA: Diagnosis present

## 2023-05-18 LAB — PULMONARY FUNCTION TEST
DL/VA % pred: 105 %
DL/VA: 4.65 ml/min/mmHg/L
DLCO unc % pred: 99 %
DLCO unc: 18.05 ml/min/mmHg
FEF 25-75 Post: 2.32 L/s
FEF 25-75 Pre: 2.38 L/s
FEF2575-%Change-Post: -2 %
FEF2575-%Pred-Post: 91 %
FEF2575-%Pred-Pre: 93 %
FEV1-%Change-Post: 0 %
FEV1-%Pred-Post: 95 %
FEV1-%Pred-Pre: 96 %
FEV1-Post: 2.33 L
FEV1-Pre: 2.35 L
FEV1FVC-%Change-Post: 1 %
FEV1FVC-%Pred-Pre: 100 %
FEV6-%Change-Post: -2 %
FEV6-%Pred-Post: 95 %
FEV6-%Pred-Pre: 97 %
FEV6-Post: 2.85 L
FEV6-Pre: 2.92 L
FEV6FVC-%Pred-Post: 102 %
FEV6FVC-%Pred-Pre: 102 %
FVC-%Change-Post: -2 %
FVC-%Pred-Post: 92 %
FVC-%Pred-Pre: 95 %
FVC-Post: 2.85 L
FVC-Pre: 2.92 L
Post FEV1/FVC ratio: 82 %
Post FEV6/FVC ratio: 100 %
Pre FEV1/FVC ratio: 80 %
Pre FEV6/FVC Ratio: 100 %
RV % pred: 70 %
RV: 1.12 L
TLC % pred: 96 %
TLC: 4.29 L

## 2023-05-18 MED ORDER — ALBUTEROL SULFATE (2.5 MG/3ML) 0.083% IN NEBU
2.5000 mg | INHALATION_SOLUTION | Freq: Once | RESPIRATORY_TRACT | Status: AC
  Administered 2023-05-18: 2.5 mg via RESPIRATORY_TRACT

## 2023-05-25 NOTE — Progress Notes (Signed)
 " Cardiology Office Note:  .   Date:  05/26/2023  ID:  Sandra [name], DOB [birth year], MRN [id number] PCP: Myra Geni ORN, FNP  [facility] Providers Cardiologist:  Maude Emmer, MD    Patient Profile: .      PMH Coronary artery disease Cath 05/2020 Diffuse small vessel disease (diabetic) P-mRCA 70; occluded PDA with L>R collaterals, OM2 60 Medical management Myoview  06/2020 ([facility]) No ischemia EF 34 Myoview  02/2020 No ischemia  EF 45 ? 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 Mona on 08/22/2020.  She reported family history of early onset heart disease in her father who also had high cholesterol.  Lipid panel at that time revealed total cholesterol 202, direct LDL 146, HDL 31, and triglyceride 168.  She was on rosuvastatin  40 mg daily and fenofibrate .  Her LDL without high potency statin thought to be well over 190 consistent with familial hyperlipidemia.  She was started on Repatha  but initially had less than expected improvement in cholesterol with LDL down to 99 from 146. She demonstrated appropriate use.  Most reports that lipid panel 11/10/2022 reveals LDL particle number 2514, LDL-C 192, HDL 35, triglycerides 177, total cholesterol 261, small particle 1481.  Seen by me on 11/18/22 for management of dyslipidemia. She reported a significant improvement in depressive symptoms after the addition of Vraylar to her current regimen of fluoxetine  and bupropion . She was very inactive prior to the addition of Vraylar. She also mentions a recent  diagnosis of vertigo, which has caused some balance issues and reluctance to engage in physical activity. Wants to see a nerve specialist for numbness in both upper extremities. She expressed concern about weight gain, acknowledging a lack of healthy eating habits and physical activity. She is currently unemployed and is seeking disability. She is having difficulty with affording her medications and has been out of simvastatin, fenofibrate , and Repatha  for about 5 months. She denied chest pain, shortness of breath, lower extremity edema, fatigue, palpitations, presyncope, syncope, orthopnea, and PND. Says that she feels great today. She was referred to [facility] for help with getting her medications.        History of Present Illness: .    History of Present Illness Patient is a pleasant 48 year old female with history of CAD who is here today for follow-up of dyslipidemia. She reports intermittent chest pain, primarily when upset, with a sensation of breathlessness and gasping for air. The pain typically occurs with walking long distances, but has also occurred with regular activities. She has not used nitroglycerin  tablets recently. She reports she was able to get her medications at affordable pricing with our assistance. She has resumed Repatha  injections every other week, having given herself approximately 10 injections since last office visit. She also takes amlodipine  and metoprolol , which she restarted this month after a lapse. Has had a left foot injury for approximately 4 years with swelling in that foot. No additional edema. No orthopnea, PND, palpitations, presyncope or syncope. She has occasional vertigo.   Discussed the use of AI scribe software for clinical note transcription with the patient, who gave verbal consent to proceed.   ROS: See  HPI       Studies Reviewed: SABRA   EKG Interpretation Date/Time:  Wednesday May 26 2023 10:39:07 EDT Ventricular Rate:  77 PR Interval:  154 QRS  Duration:  100 QT Interval:  400 QTC Calculation: 452 R Axis:   22  Text Interpretation: Normal sinus rhythm Possible Anterior infarct , age undetermined No acute changes Confirmed by Percy Browning [phone]) on 05/26/2023 10:56:43 AM    Risk Assessment/Calculations:             Physical Exam:   VS:  BP 128/84   Pulse 77   Ht 5' (1.524 m)   Wt 190 lb 6.4 oz (86.4 kg)   SpO2 96%   BMI 37.18 kg/m    Wt Readings from Last 3 Encounters:  05/26/23 190 lb 6.4 oz (86.4 kg)  05/03/23 189 lb 6 oz (85.9 kg)  04/05/23 184 lb (83.5 kg)    GEN: Obese, well developed in no acute distress NECK: No JVD; No carotid bruits CARDIAC: RRR, no murmurs, rubs, gallops RESPIRATORY:  Clear to auscultation without rales, wheezing or rhonchi  ABDOMEN: Soft, non-tender, non-distended EXTREMITIES:  No edema; No deformity     ASSESSMENT AND PLAN: .    Dyslipidemia LDL goal < 55: At last office visit 11/2022, she expressed difficulty affording her medications. She was off rosuvastatin , Repatha  and fenofibrate  for several months.  She has completed 10 injections of Repatha . We will get fasting lipid panel when she can return fasting. Emphasized the importance of heart healthy mostly plant based diet avoiding saturated fat, processed foods, simple carbohydrates, and sugar along with aiming for at least 150 minutes of moderate intensity exercise each week. Continue rosuvastatin , fenofibrate , Repatha .  CAD: History of CAD as noted from cath 05/2020 with recommendation for medical management. She reports occasional chest pain and DOE.  No significant changes recently; no SL NTG use.  Low risk stress test 04/2022. Admits she is very sedentary. We will contact her local [employer] to see if there is an exercise program for referral. She has not been taking metoprolol  or amlodipine  for several months. No indication for ischemia evaluation at this time.  I encouraged her to resume anti-anginal therapy and notify us  if angina  symptoms persist or worsen.   ASCVD Risk: She is followed by Dr. Nishan for general cardiology.  Emphasized the importance of secondary prevention as noted above.  Offered simple exercises she can do in her home to increase physical activity.        Dispo: 6 months with Dr. Delford  Signed, Browning Percy, NP-C "

## 2023-05-26 ENCOUNTER — Other Ambulatory Visit (HOSPITAL_BASED_OUTPATIENT_CLINIC_OR_DEPARTMENT_OTHER): Payer: Self-pay | Admitting: *Deleted

## 2023-05-26 ENCOUNTER — Ambulatory Visit (HOSPITAL_BASED_OUTPATIENT_CLINIC_OR_DEPARTMENT_OTHER): Payer: MEDICAID | Admitting: Nurse Practitioner

## 2023-05-26 ENCOUNTER — Telehealth (HOSPITAL_BASED_OUTPATIENT_CLINIC_OR_DEPARTMENT_OTHER): Payer: Self-pay | Admitting: *Deleted

## 2023-05-26 ENCOUNTER — Encounter (HOSPITAL_BASED_OUTPATIENT_CLINIC_OR_DEPARTMENT_OTHER): Payer: Self-pay | Admitting: Nurse Practitioner

## 2023-05-26 VITALS — BP 128/84 | HR 77 | Ht 60.0 in | Wt 190.4 lb

## 2023-05-26 DIAGNOSIS — E782 Mixed hyperlipidemia: Secondary | ICD-10-CM

## 2023-05-26 DIAGNOSIS — I25119 Atherosclerotic heart disease of native coronary artery with unspecified angina pectoris: Secondary | ICD-10-CM | POA: Diagnosis not present

## 2023-05-26 DIAGNOSIS — I10 Essential (primary) hypertension: Secondary | ICD-10-CM

## 2023-05-26 DIAGNOSIS — E7801 Familial hypercholesterolemia: Secondary | ICD-10-CM | POA: Diagnosis not present

## 2023-05-26 DIAGNOSIS — Z7189 Other specified counseling: Secondary | ICD-10-CM

## 2023-05-26 MED ORDER — REPATHA SURECLICK 140 MG/ML ~~LOC~~ SOAJ
140.0000 mg | SUBCUTANEOUS | 3 refills | Status: AC
Start: 2023-05-26 — End: ?

## 2023-05-26 MED ORDER — METOPROLOL SUCCINATE ER 50 MG PO TB24: 50.0000 mg | ORAL_TABLET | Freq: Every day | ORAL | 3 refills | Status: AC

## 2023-05-26 MED ORDER — ROSUVASTATIN CALCIUM 20 MG PO TABS: 20.0000 mg | ORAL_TABLET | Freq: Every day | ORAL | 3 refills | Status: AC

## 2023-05-26 MED ORDER — AMLODIPINE BESYLATE 5 MG PO TABS: 10.0000 mg | ORAL_TABLET | Freq: Every day | ORAL | 3 refills | Status: DC

## 2023-05-26 NOTE — Telephone Encounter (Signed)
 S/w AJ at [phone] at the [facility] in [place] about pt getting into the facility. Pt can call insurance company and ask for a renewed active gym code to see it pt can get free membership or Can go in person to [facility] and fill out paperwork for a scholarship for free membership which is based on income. Pt given all information on AVS today.

## 2023-05-26 NOTE — Patient Instructions (Signed)
 Medication Instructions:   Your physician recommends that you continue on your current medications as directed. Please refer to the Current Medication list given to you today.   *If you need a refill on your cardiac medications before your next appointment, please call your pharmacy*  Lab Work:  Please get fasting labs in [place], fasting after midnight at your convenience. Patient given paperwork today.  If you have labs (blood work) drawn today and your tests are completely normal, you will receive your results only by: MyChart Message (if you have MyChart) OR A paper copy in the mail If you have any lab test that is abnormal or we need to change your treatment, we will call you to review the results.  Testing/Procedures:  None ordered.  Follow-Up: At [facility], you and your health needs are our priority.  As part of our continuing mission to provide you with exceptional heart care, our providers are all part of one team.  This team includes your primary Cardiologist (physician) and Advanced Practice Providers or APPs (Physician Assistants and Nurse Practitioners) who all work together to provide you with the care you need, when you need it.  Your next appointment:   5 month(s)  Provider:   Janelle Mediate, MD    We recommend signing up for the patient portal called "MyChart".  Sign up information is provided on this After Visit Summary.  MyChart is used to connect with patients for Virtual Visits (Telemedicine).  Patients are able to view lab/test results, encounter notes, upcoming appointments, etc.  Non-urgent messages can be sent to your provider as well.   To learn more about what you can do with MyChart, go to [email or web address].   Other Instructions  Your physician wants you to follow-up in: 5 months.  You will receive a reminder letter in the mail two months in advance. If you don't receive a letter, please call our office to schedule the follow-up  appointment.    1st Floor: - Lobby - Registration  - Pharmacy  - Lab - Cafe  2nd Floor: - PV Lab - Diagnostic Testing (echo, CT, nuclear med)  3rd Floor: - Vacant  4th Floor: - TCTS (cardiothoracic surgery) - AFib Clinic - Structural Heart Clinic - Vascular Surgery  - Vascular Ultrasound  5th Floor: - HeartCare Cardiology (general and EP) - Clinical Pharmacy for coumadin, hypertension, lipid, weight-loss medications, and med management appointments    Valet parking services will be available as well.    I called the [employer] @ [phone] you can go in the facility and apply for a scholarship based on income for free membership.  You can also call your insurance company and ask for a renewed active gym code for free gym service.

## 2023-05-28 ENCOUNTER — Encounter: Payer: Self-pay | Admitting: Nurse Practitioner

## 2023-05-28 ENCOUNTER — Ambulatory Visit (INDEPENDENT_AMBULATORY_CARE_PROVIDER_SITE_OTHER): Payer: MEDICAID | Admitting: Nurse Practitioner

## 2023-05-28 VITALS — BP 112/80 | HR 98 | Ht 60.0 in | Wt 186.2 lb

## 2023-05-28 DIAGNOSIS — Z794 Long term (current) use of insulin: Secondary | ICD-10-CM | POA: Diagnosis not present

## 2023-05-28 DIAGNOSIS — E1165 Type 2 diabetes mellitus with hyperglycemia: Secondary | ICD-10-CM

## 2023-05-28 DIAGNOSIS — E782 Mixed hyperlipidemia: Secondary | ICD-10-CM | POA: Diagnosis not present

## 2023-05-28 DIAGNOSIS — I10 Essential (primary) hypertension: Secondary | ICD-10-CM | POA: Diagnosis not present

## 2023-05-28 LAB — POCT GLYCOSYLATED HEMOGLOBIN (HGB A1C): Hemoglobin A1C: 7.4 % — AB (ref 4.0–5.6)

## 2023-05-28 MED ORDER — ACCU-CHEK GUIDE TEST VI STRP: ORAL_STRIP | 12 refills | Status: AC

## 2023-05-28 MED ORDER — DEXCOM G7 SENSOR MISC: 1.0000 | 3 refills | Status: AC

## 2023-05-28 MED ORDER — INSULIN ASPART 100 UNIT/ML IJ SOLN: INTRAMUSCULAR | 6 refills | Status: DC

## 2023-05-28 MED ORDER — DEXCOM G7 RECEIVER DEVI: 1.0000 | Freq: Once | 0 refills | Status: AC

## 2023-05-28 MED ORDER — ACCU-CHEK SOFTCLIX LANCETS MISC
12 refills | Status: AC
Start: 2023-05-28 — End: ?

## 2023-05-28 NOTE — Progress Notes (Signed)
 05/28/2023, 10:45 AM                   Endocrinology follow-up note   Subjective:    Patient ID: Sandra [name], female    DOB: July 17, 1975.  Sandra [name] is being seen in follow-up  for management of currently uncontrolled symptomatic type 2  diabetes requested by  Caresse Chant, FNP.   Past Medical History:  Diagnosis Date   ADHD (attention deficit hyperactivity disorder)    Allergic rhinitis    Anemia    hx of   Anxiety    Arthritis    Barrett's esophagus    Bipolar 1 disorder (HCC)    Bladder tumor    CAD (coronary artery disease)    Cancer (HCC)    bladder   Constipation    Depression    Dilated cardiomyopathy (HCC) 12/20/2020   Echo 6/22 ([facility]): EF 43, mild MR, mild TR // Echo 3/22: EF 50-55   Dizziness    Full dentures    GERD (gastroesophageal reflux disease)    Gross hematuria    History of kidney stones    Hyperlipidemia    Hypertension    Stable angina (HCC)    Type 2 diabetes mellitus (HCC)    Urgency of urination    dysuria, sui   Wears glasses     Past Surgical History:  Procedure Laterality Date   ABDOMINAL HYSTERECTOMY     ANTERIOR CERVICAL DECOMP/DISCECTOMY FUSION N/A 03/05/2022   Procedure: ANTERIOR CERVICAL DECOMPRESSION/DISCECTOMY FUSION 1 LEVEL; C6-C7;  Surgeon: Mort Ards, MD;  Location: [facility];  Service: Orthopedics;  Laterality: N/A;  3 hrs 3 C-Bed   ANTERIOR INTEROSSEOUS NERVE DECOMPRESSION Right 03/16/2023   Procedure: Right elbow ulnar nerve release with anterior transposition and flexor pronator lengthening as necessary.;  Surgeon: Ronn Cohn, MD;  Location: [facility];  Service: Orthopedics;  Laterality: Right;  90 mins   BALLOON DILATION N/A 02/17/2021   Procedure: BALLOON DILATION;  Surgeon: Vinetta Greening, DO;  [facility];  Service: Endoscopy;  Laterality: N/A;   BIOPSY  02/17/2021   Procedure: BIOPSY;  Surgeon: Vinetta Greening, DO;   [facility];  Service: Endoscopy;;   COLONOSCOPY WITH PROPOFOL  N/A 02/17/2021   Surgeon: Vinetta Greening, DO; nonbleeding internal hemorrhoids, otherwise normal exam.  Repeat in 10 years.   CYSTOSCOPY N/A 10/03/2015   Procedure: CYSTOSCOPY;  Surgeon: Homero Luster, MD;  Location: [facility];  Service: Urology;  Laterality: N/A;   CYSTOSCOPY W/ URETERAL STENT PLACEMENT Right 07/24/2021   Procedure: CYSTOSCOPY WITH RETROGRADE PYELOGRAM/URETERAL STENT PLACEMENT;  Surgeon: Marco Severs, MD;  Location: [facility];  Service: Urology;  Laterality: Right;   CYSTOSCOPY WITH RETROGRADE PYELOGRAM, URETEROSCOPY AND STENT PLACEMENT Right 08/14/2021   Procedure: CYSTOSCOPY WITH RETROGRADE PYELOGRAM, URETEROSCOPY AND STENT EXCHANGE;  Surgeon: Marco Severs, MD;  Location: [facility];  Service: Urology;  Laterality: Right;   CYSTOSCOPY WITH STENT PLACEMENT Right 10/31/2015   Procedure: CYSTOSCOPY WITH ATTEMPTED RIGHT URETERAL OPENING;  Surgeon: Homero Luster, MD;  Location: [facility];  Service: Urology;  Laterality: Right;   ESOPHAGOGASTRODUODENOSCOPY (EGD) WITH  PROPOFOL  N/A 02/17/2021   Surgeon: Vinetta Greening, DO; 2 cm hiatal hernia, Barrett's esophagus, gastritis with biopsies negative for H. pylori.  S/p empiric esophageal dilation.  Repeat in 3 years.   FOOT SURGERY Left    September 2022, broke 7 bones in left foot   INTERSTIM IMPLANT PLACEMENT N/A 03/14/2018   Procedure: Simona Dublin IMPLANT FIRST STAGE;  Surgeon: Marco Severs, MD;  [facility]: [facility];  Service: Urology;  Laterality: N/A;   INTERSTIM IMPLANT PLACEMENT N/A 03/30/2018   Procedure: Simona Dublin IMPLANT SECOND STAGE;  Surgeon: Marco Severs, MD;  Location: [facility];  Service: Urology;  Laterality: N/A;   INTERSTIM IMPLANT REVISION N/A 12/01/2021   Procedure: REVISION OF Simona Dublin- generator anf lead replacement;  Surgeon: Marco Severs, MD;  Location: [facility];  Service: Urology;   Laterality: N/A;   LEFT HEART CATH AND CORONARY ANGIOGRAPHY N/A 05/31/2020   Procedure: LEFT HEART CATH AND CORONARY ANGIOGRAPHY;  Surgeon: Millicent Ally, MD;  [facility];  Service: Cardiovascular;  Laterality: N/A;   MULTIPLE TOOTH EXTRACTIONS  2015   OVARIAN CYST REMOVAL Right 2004 approx   SHOULDER OPEN ROTATOR CUFF REPAIR Left 04/20/2019   Procedure: ROTATOR CUFF REPAIR SHOULDER OPEN;  Surgeon: Darrin Emerald, MD;  Location: [facility];  Service: Orthopedics;  Laterality: Left;   TOE ARTHROPLASTY Left 05/03/2023   Procedure: 2nd metatarsal head resection;  Surgeon: Ali Ink, MD;  Location: [facility];  Service: Orthopedics;  Laterality: Left;  LEFT 2ND HAMMERTOE CORRECTION WITH FUSION PIPP   TONSILLECTOMY  12/30/2004   TOTAL ABDOMINAL HYSTERECTOMY W/ BILATERAL SALPINGOOPHORECTOMY  05/16/2004   TRANSURETHRAL RESECTION OF BLADDER TUMOR N/A 10/03/2015   Procedure: TRANSURETHRAL RESECTION OF BLADDER TUMOR (TURBT);  Surgeon: Homero Luster, MD;  Location[facility];  Service: Urology;  Laterality: N/A;   TRANSURETHRAL RESECTION OF BLADDER TUMOR N/A 10/31/2015   Procedure: RE-STAGINGG TRANSURETHRAL RESECTION OF BLADDER TUMOR (TURBT);  Surgeon: Homero Luster, MD;  Location: [facility];  Service: Urology;  Laterality: N/A;   TRANSURETHRAL RESECTION OF BLADDER TUMOR N/A 08/17/2016   Procedure: TRANSURETHRAL RESECTION OF BLADDER TUMOR (TURBT);  Surgeon: Marco Severs, MD;  [facility];  Service: Urology;  Laterality: N/A;   URETERAL BIOPSY Right 08/14/2021   Procedure: URETERAL BIOPSY;  Surgeon: Marco Severs, MD;  Location: [facility];  Service: Urology;  Laterality: Right;   WEIL OSTEOTOMY Left 05/03/2023   Procedure: Daphine Eagle;  Surgeon: Ali Ink, MD;  Location: [facility];  Service: Orthopedics;  Laterality: Left;  possible 2nd metatarsal Weil osteotomy versus metatarsal head resection  with foot callus debridement    Social History   Socioeconomic History   Marital status: Married    Spouse name: Not on file   Number of children: Not on file   Years of education: Not on file   Highest education level: Not on file  Occupational History   Not on file  Tobacco Use   Smoking status: Former    Current packs/day: 0.00    Average packs/day: 0.5 packs/day for 10.0 years (5.0 ttl pk-yrs)    Types: Cigarettes    Start date: 02/01/2011    Quit date: 01/31/2021    Years since quitting: 2.3   Smokeless tobacco: Never   Tobacco comments:    02/06/21-no smoking past 5 days  Vaping Use   Vaping status: Never Used  Substance and Sexual Activity   Alcohol use: No   Drug  use: No   Sexual activity: Not Currently    Birth control/protection: Surgical    Comment: hyst  Other Topics Concern   Not on file  Social History Narrative   Are you right handed or left handed? Left Handed    Are you currently employed ? No    What is your current occupation?   Do you live at home alone? No    Who lives with you? Lives with daughter    What type of home do you live in: 1 story or 2 story? Lives in a trailer        Social Drivers of Health   Financial Resource Strain: High Risk (11/19/2022)   Overall Financial Resource Strain (CARDIA)    Difficulty of Paying Living Expenses: Hard  Food Insecurity: Food Insecurity Present (11/19/2022)   Hunger Vital Sign    Worried About Running Out of Food in the Last Year: Sometimes true    Ran Out of Food in the Last Year: Never true  Transportation Needs: No Transportation Needs (11/19/2022)   PRAPARE - Administrator, Civil Service (Medical): No    Lack of Transportation (Non-Medical): No  Physical Activity: Not on file  Stress: Not on file  Social Connections: Unknown (05/27/2021)   Received from [facility]   Social Network    Social Network: Not on file    Family History  Problem Relation Age of Onset    Hypertension Mother    Cancer Mother    Breast cancer Maternal Aunt    Lung cancer Maternal Uncle    Cancer Other    Diabetes Daughter    Colon cancer Neg Hx     Outpatient Encounter Medications as of 05/28/2023  Medication Sig   amLODipine  (NORVASC ) 5 MG tablet Take 2 tablets (10 mg total) by mouth daily.   aspirin  EC 81 MG tablet Take 1 tablet (81 mg total) by mouth daily with breakfast. Swallow whole.   Blood Glucose Monitoring Suppl (ACCU-CHEK GUIDE) w/Device KIT 1 Piece by Does not apply route as directed.   Blood Pressure Monitoring (BLOOD PRESSURE CUFF) MISC See admin instructions.   budeson-glycopyrrolate -formoterol  (BREZTRI  AEROSPHERE) 160-9-4.8 MCG/ACT AERO Take 2 puffs first thing in am and then another 2 puffs about 12 hours later.   buPROPion  (WELLBUTRIN  XL) 300 MG 24 hr tablet Take 300 mg by mouth every morning.   cariprazine (VRAYLAR) 3 MG capsule Take 3 mg by mouth at bedtime.   cloNIDine  HCl (KAPVAY ) 0.1 MG TB12 ER tablet Take 0.1 mg by mouth at bedtime.   Continuous Glucose Receiver (DEXCOM G7 RECEIVER) DEVI 1 Device by Does not apply route once for 1 dose.   Continuous Glucose Sensor (DEXCOM G7 SENSOR) MISC Inject 1 Application into the skin as directed. Change sensor every 10 days as directed.   cyclobenzaprine  (FLEXERIL ) 10 MG tablet Take 1 tablet (10 mg total) by mouth 3 (three) times daily as needed for muscle spasms.   Docusate Sodium  (DSS) 100 MG CAPS TAKE ONE CAPSULE TWICE DAILY BY MOUTH   Evolocumab  (REPATHA  SURECLICK) 140 MG/ML SOAJ Inject 140 mg into the skin every 14 (fourteen) days.   fenofibrate  (TRICOR ) 145 MG tablet take 1 tablet once daily.   FLUoxetine  (PROZAC ) 40 MG capsule Take 40 mg by mouth in the morning.   fluticasone  (FLONASE ) 50 MCG/ACT nasal spray Place 2 sprays into both nostrils daily.   gabapentin  (NEURONTIN ) 800 MG tablet Take 800 mg by mouth 3 (three) times  daily.   GLOBAL EASE INJECT PEN NEEDLES 31G X 5 MM MISC Inject into the skin.    glucose blood (ACCU-CHEK GUIDE TEST) test strip Use as instructed to monitor glucose 4 times daily   HYDROcodone -acetaminophen  (NORCO/VICODIN) 5-325 MG tablet Take 1 tablet by mouth every 6 (six) hours as needed.   Insulin  Disposable Pump (OMNIPOD DASH PDM, GEN 4,) KIT Change pod every 48-72 hours   Insulin  Disposable Pump (OMNIPOD DASH PODS, GEN 4,) MISC CHANGE POD EVERY 48 HOURS AS DIRECTED   Insulin  Pen Needle (PEN NEEDLES) 31G X 6 MM MISC Use to inject insulin  4 times daily   INSULIN  SYRINGE 1CC/29G 29G X 1/2" 1 ML MISC Use to inject insulin  into Omnipod   isosorbide  mononitrate (IMDUR ) 30 MG 24 hr tablet Take 1 tablet (30 mg total) by mouth daily.   lisdexamfetamine (VYVANSE ) 60 MG capsule Take 60 mg by mouth every morning.   meclizine (ANTIVERT) 25 MG tablet Take 25 mg by mouth 4 (four) times a week.   metoprolol  succinate (TOPROL -XL) 50 MG 24 hr tablet Take 1 tablet (50 mg total) by mouth daily. Take with or immediately following a meal.   montelukast  (SINGULAIR ) 10 MG tablet Take 1 tablet by mouth daily.   nitroGLYCERIN  (NITROSTAT ) 0.4 MG SL tablet PLACE 1 TAB UNDER TONGUE EVERY 3 MINUTES AS DIRECTED UP TO 3 TIMES AS NEEDED FOR CHEST PAIN. (Patient taking differently: Place 0.4 mg under the tongue every 5 (five) minutes as needed for chest pain.)   pantoprazole  (PROTONIX ) 40 MG tablet take 1 tablet twice daily before a meal   rosuvastatin  (CRESTOR ) 20 MG tablet Take 1 tablet (20 mg total) by mouth daily.   scopolamine  (TRANSDERM-SCOP) 1 MG/3DAYS Place 1 patch onto the skin every 3 (three) days.   tamsulosin  (FLOMAX ) 0.4 MG CAPS capsule TAKE 1 CAPSULE BY MOUTH AT BEDTIME.   Vitamin D , Ergocalciferol , (DRISDOL) 1.25 MG (50000 UNIT) CAPS capsule Take 50,000 Units by mouth once a week.   [DISCONTINUED] ACCU-CHEK GUIDE test strip USE AS INSTRUCTED TO MONITOR GLUCOSE 4 TIMES DAILY BEFORE MEALS AND BEFORE BED.   [DISCONTINUED] Accu-Chek Softclix Lancets lancets Use as instructed to monitor glucose  4 times daily   [DISCONTINUED] Continuous Glucose Sensor (DEXCOM G7 SENSOR) MISC Inject 1 Application into the skin as directed. Change sensor every 10 days as directed.   [DISCONTINUED] insulin  aspart (NOVOLOG ) 100 UNIT/ML injection Use with Omnipod for TDD around 150 units daily   [DISCONTINUED] insulin  glargine (LANTUS ) 100 UNIT/ML injection Inject into the skin as needed (Back up if needed).   Accu-Chek Softclix Lancets lancets Use as instructed to monitor glucose 4 times daily   insulin  aspart (NOVOLOG ) 100 UNIT/ML injection Use with Omnipod for TDD around 150 units daily   No facility-administered encounter medications on file as of 05/28/2023.    ALLERGIES: Allergies  Allergen Reactions   Ibuprofen Hives and Itching   Benazepril Hives   Diphenhydramine  Hives   Latex Itching and Dermatitis    States she had redness to skin and itching    Metronidazole  Nausea And Vomiting   Oxycodone      Pt has tolerated hydromorphone  in the past and takes Norco at home.   Toradol  [Ketorolac  Tromethamine ] Itching   Zofran  [Ondansetron  Hcl] Hives   Adhesive [Tape] Rash   Codeine Hives, Nausea And Vomiting and Rash   Loratadine  Rash   Nystatin -Triamcinolone  Rash   Penicillins Nausea Only and Rash    Received ancef  w/o issue (7/23)  VACCINATION STATUS: Immunization History  Administered Date(s) Administered   Influenza,inj,Quad PF,6+ Mos 11/01/2015    Diabetes She presents for her follow-up diabetic visit. She has type 2 diabetes mellitus. Onset time: She was diagnosed at approximate age of 18 years with A1c of greater than 15% Her disease course has been stable. There are no hypoglycemic associated symptoms. Pertinent negatives for hypoglycemia include no confusion, headaches, pallor or seizures. Associated symptoms include foot paresthesias. Pertinent negatives for diabetes include no blurred vision, no chest pain, no fatigue, no polydipsia, no polyphagia, no polyuria and no weight loss.  There are no hypoglycemic complications. Symptoms are stable. Diabetic complications include peripheral neuropathy. Risk factors for coronary artery disease include dyslipidemia, diabetes mellitus, hypertension, sedentary lifestyle and tobacco exposure. Current diabetic treatment includes insulin  pump. She is compliant with treatment most of the time. Her weight is fluctuating minimally. She is following a generally unhealthy diet. When asked about meal planning, she reported none. She has not had a previous visit with a dietitian. She rarely participates in exercise. Her home blood glucose trend is fluctuating minimally. Her overall blood glucose range is 140-180 mg/dl. (She presents today with her CGM and Omnipod DASH showing slightly above target glycemic profile.  Her POCT A1c today is 7.4%,unchanged from last visit.  She has had trouble with her G6 sensors, wants to upgrade to G7.  She has been using meter in the interim with 7-day average of 159 (8 readings); 14-day average of 203 (17 readings); 30-day average of 201 (36 readings); 90-day average of 198 (116 readings). She also notes she has picked up her Diet Mt Dew again.) An ACE inhibitor/angiotensin II receptor blocker is not being taken. She does not see a podiatrist.Eye exam is current.  Hyperlipidemia This is a chronic problem. The current episode started more than 1 year ago. The problem is uncontrolled. Recent lipid tests were reviewed and are high. Exacerbating diseases include diabetes. Factors aggravating her hyperlipidemia include beta blockers and fatty foods. Pertinent negatives include no chest pain, myalgias or shortness of breath. Current antihyperlipidemic treatment includes fibric acid derivatives and statins. The current treatment provides mild improvement of lipids. Compliance problems include adherence to diet and adherence to exercise.  Risk factors for coronary artery disease include diabetes mellitus, dyslipidemia, a sedentary  lifestyle, family history and hypertension.  Hypertension This is a chronic problem. The current episode started more than 1 year ago. The problem is uncontrolled. Pertinent negatives include no blurred vision, chest pain, headaches, palpitations or shortness of breath. There are no associated agents to hypertension. Risk factors for coronary artery disease include dyslipidemia, diabetes mellitus and sedentary lifestyle. Past treatments include calcium  channel blockers and beta blockers. The current treatment provides mild improvement. Compliance problems include diet and exercise.  Hypertensive end-organ damage includes CAD/MI.    Review of systems  Constitutional: + stable body weight,  current Body mass index is 36.36 kg/m. , no fatigue, no subjective hyperthermia, no subjective hypothermia Eyes: no blurry vision, no xerophthalmia ENT: no sore throat, no nodules palpated in throat, no dysphagia/odynophagia, no hoarseness Cardiovascular: no chest pain, no shortness of breath, no palpitations, no leg swelling Respiratory: no cough, no shortness of breath Musculoskeletal: no muscle/joint aches Skin: no rashes, no hyperemia Neurological: no tremors, + numbness/tingling to BLE, no dizziness Psychiatric: no depression, no anxiety   Objective:    BP 112/80 (BP Location: Right Arm, Patient Position: Sitting, Cuff Size: Large)   Pulse 98   Ht 5' (1.524 m)  Wt 186 lb 3.2 oz (84.5 kg)   BMI 36.36 kg/m   Wt Readings from Last 3 Encounters:  05/28/23 186 lb 3.2 oz (84.5 kg)  05/26/23 190 lb 6.4 oz (86.4 kg)  05/03/23 189 lb 6 oz (85.9 kg)    BP Readings from Last 3 Encounters:  05/28/23 112/80  05/26/23 128/84  05/03/23 (!) 143/89     Physical Exam- Limited  Constitutional:  Body mass index is 36.36 kg/m. , not in acute distress, normal state of mind Eyes:  EOMI, no exophthalmos Musculoskeletal: no gross deformities, strength intact in all four extremities, no gross restriction of  joint movements Skin:  no rashes, no hyperemia Neurological: no tremor with outstretched hands   Diabetic Foot Exam - Simple   No data filed    CMP ( most recent) CMP     Component Value Date/Time   NA 137 04/29/2023 1218   NA 136 11/10/2022 1135   K 4.4 04/29/2023 1218   CL 102 04/29/2023 1218   CO2 23 04/29/2023 1218   GLUCOSE 206 (H) 04/29/2023 1218   BUN 10 04/29/2023 1218   BUN 11 11/10/2022 1135   CREATININE 0.95 04/29/2023 1218   CALCIUM  9.1 04/29/2023 1218   PROT 6.8 11/10/2022 1135   ALBUMIN 4.1 11/10/2022 1135   AST 19 11/10/2022 1135   ALT 19 11/10/2022 1135   ALKPHOS 122 (H) 11/10/2022 1135   BILITOT <0.2 11/10/2022 1135   GFRNONAA >60 04/29/2023 1218   GFRAA >60 04/17/2019 1427     Diabetic Labs (most recent): Lab Results  Component Value Date   HGBA1C 7.4 (A) 05/28/2023   HGBA1C 7.4 (A) 12/18/2022   HGBA1C 7.1 (A) 09/15/2022   MICROALBUR 80 06/24/2021   MICROALBUR 80 07/10/2020      Assessment & Plan:   1) Uncontrolled type 2 diabetes mellitus with hyperglycemia (HCC)  - Destanee L Dentice has currently uncontrolled symptomatic type 2 DM since  48 years of age.  She presents today with her CGM and Omnipod DASH showing slightly above target glycemic profile.  Her POCT A1c today is 7.4%,unchanged from last visit.  She has had trouble with her G6 sensors, wants to upgrade to G7.  She has been using meter in the interim with 7-day average of 159 (8 readings); 14-day average of 203 (17 readings); 30-day average of 201 (36 readings); 90-day average of 198 (116 readings). She also notes she has picked up her Diet Mt Dew again.  - I had a long discussion with her about the progressive nature of diabetes and the pathology behind its complications. -her diabetes is complicated by chronic smoking and she remains at a high risk for more acute and chronic complications which include CAD, CVA, CKD, retinopathy, and neuropathy. These are all discussed in detail with  her.  - Nutritional counseling repeated at each appointment due to patients tendency to fall back in to old habits.  - The patient admits there is a room for improvement in their diet and drink choices. -  Suggestion is made for the patient to avoid simple carbohydrates from their diet including Cakes, Sweet Desserts / Pastries, Ice Cream, Soda (diet and regular), Sweet Tea, Candies, Chips, Cookies, Sweet Pastries, Store Bought Juices, Alcohol in Excess of 1-2 drinks a day, Artificial Sweeteners, Coffee Creamer, and "Sugar-free" Products. This will help patient to have stable blood glucose profile and potentially avoid unintended weight gain.   - I encouraged the patient to switch to unprocessed or minimally processed  complex starch and increased protein intake (animal or plant source), fruits, and vegetables.   - Patient is advised to stick to a routine mealtimes to eat 3 meals a day and avoid unnecessary snacks (to snack only to correct hypoglycemia).  - I have approached her with the following individualized plan to manage  her diabetes and patient agrees:   -No changes will be made to her insulin  pump settings today. I did encourage her to be more consistent with bolusing prior to meals.  -She is encouraged to continue monitoring blood glucose 4 times daily (using her CGM), before meals and at bedtime, and to call the clinic if she has readings less than 70 or greater than 300 for 3 readings in a row.  I did send in for upgrade to the Dexcom G7 for her per her request.  - Specific targets for  A1c;  LDL, HDL, Triglycerides, were discussed with the patient.  2) Blood Pressure /Hypertension: Her blood pressure is not controlled to target. It does not appear she is on any antihypertensive medications.  She is allergic to ACE inhibitors. She has a history of being a chronic heavy smoker, quit in 2021.  Aaron Aas   3) Lipids/Hyperlipidemia:  Her most recent lipid panel from 11/10/22 shows uncontrolled  LDL of 191 (worsening) and elevated triglycerides of 180 (improved).  She is advised to continue her Crestor  40 mg po daily at bedtime, Repatha  140 mg po every 14 days, and Tricor  145 mg po daily.  Side effects and precautions discussed with her.  She has fasting labs with her PCP coming up, I asked that copy be brought to use next visit for our records.  4)  Weight/Diet:  Her Body mass index is 36.36 kg/m.-clearly complicating her diabetes care.  She is a candidate for modest weight loss.  I discussed with her the fact that loss of 5 - 10% of her  current body weight will have the most impact on her diabetes management.  Exercise, and detailed carbohydrates information provided  -  detailed on discharge instructions.  5) Chronic Care/Health Maintenance: -she is not on ACEI/ARB and is on Statin medications and is encouraged to initiate and continue to follow up with Ophthalmology, Dentist,  Podiatrist at least yearly or according to recommendations, and advised to stay away from smoking. I have recommended yearly flu vaccine and pneumonia vaccine at least every 5 years; moderate intensity exercise for up to 150 minutes weekly; and  sleep for at least 7 hours a day.  - she is advised to maintain close follow up with Caresse Chant, FNP for primary care needs, as well as her other providers for optimal and coordinated care.      I spent  34  minutes in the care of the patient today including review of labs from CMP, Lipids, Thyroid Function, Hematology (current and previous including abstractions from other facilities); face-to-face time discussing  her blood glucose readings/logs, discussing hypoglycemia and hyperglycemia episodes and symptoms, medications doses, her options of short and long term treatment based on the latest standards of care / guidelines;  discussion about incorporating lifestyle medicine;  and documenting the encounter. Risk reduction counseling performed per USPSTF guidelines to  reduce obesity and cardiovascular risk factors.     Please refer to Patient Instructions for Blood Glucose Monitoring and Insulin /Medications Dosing Guide"  in media tab for additional information. Please  also refer to " Patient Self Inventory" in the Media  tab for reviewed elements of  pertinent patient history.  Sandra [name] participated in the discussions, expressed understanding, and voiced agreement with the above plans.  All questions were answered to her satisfaction. she is encouraged to contact clinic should she have any questions or concerns prior to her return visit.   Follow up plan: - Return in about 4 months (around 09/28/2023) for Diabetes F/U with A1c in office, No previsit labs, Bring meter and logs.  Hulon Magic, [facility] [address] [place], [place] [address] Phone: [phone] Fax: [phone]  05/28/2023, 10:45 AM

## 2023-05-30 NOTE — Progress Notes (Deleted)
 Sandra [name], female    DOB: 1975/07/14    MRN: [id number]   Brief patient profile:  48  yowf  quit smoking 01/2021/MM   and grew up in house lots of infections and need for inhalers at age 48  referred to pulmonary clinic in [place]  04/05/2023 by Torrence Freeze NP  for doe cough     Wt at d/c smoking  170  lb on prn inhaler  but doe gradually worse since then esp fall 2024    History of Present Illness  04/05/2023  Pulmonary/ 1st office eval/ Sandra [name] / [place] [facility] @ [facility]  184  Chief Complaint  Patient presents with   Establish Care  Dyspnea:  carrying grandson across the house [employer] once a month pushes cart x 3 aisles  Cough: on and off thick green assoc with nasal drainage middle of the day none  Sleep: bed is flat, 3 pillow s resp cc  SABA use: none  02: none  Rec Omnicef  300 mg twice daily x 10 day  Plan A = Automatic = Always=    Breztri  Take 2 puffs first thing in am and then another 2 puffs about 12 hours later.   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 office/Sandra [name] re: *** maint on *** did *** bring meds  No chief complaint on file.   Dyspnea:  *** Cough: *** Sleeping: ***   resp cc  SABA use: *** 02: ***  Lung cancer screening: ***   No obvious day to day or daytime variability or assoc excess/ purulent sputum or mucus plugs or hemoptysis or cp or chest tightness, subjective wheeze or overt sinus or hb symptoms.    Also denies any obvious fluctuation of symptoms with weather or environmental changes or  other aggravating or alleviating factors except as outlined above   No unusual exposure hx or h/o childhood pna/ asthma or knowledge of premature birth.  Current Allergies, Complete Past Medical History, Past Surgical History, Family History, and Social History were reviewed in [employer] record.  ROS  The following are not active complaints unless bolded Hoarseness, sore throat, dysphagia, dental problems, itching, sneezing,  nasal congestion or discharge of excess mucus or purulent secretions, ear ache,   fever, chills, sweats, unintended wt loss or wt gain, classically pleuritic or exertional cp,  orthopnea pnd or arm/hand swelling  or leg swelling, presyncope, palpitations, abdominal pain, anorexia, nausea, vomiting, diarrhea  or change in bowel habits or change in bladder habits, change in stools or change in urine, dysuria, hematuria,  rash, arthralgias, visual complaints, headache, numbness, weakness or ataxia or problems with walking or coordination,  change in mood or  memory.        No outpatient medications have been marked as taking for the 06/01/23 encounter (Appointment) with Sandra Twiggs B, MD.  Past Medical History:  Diagnosis Date   ADHD (attention deficit hyperactivity disorder)    Allergic rhinitis    Anemia    hx of   Anxiety    Arthritis    Barrett's esophagus    Bipolar 1 disorder (HCC)    Bladder tumor    CAD (coronary artery disease)    Cancer (HCC)    bladder   Constipation    Depression    Dilated cardiomyopathy (HCC) 12/20/2020   Echo 6/22 ([facility]): EF 43, mild MR, mild TR // Echo 3/22: EF 50-55   Dizziness    Full dentures    GERD (gastroesophageal reflux disease)    Gross hematuria    History of kidney stones    Hyperlipidemia    Hypertension    Stable angina (HCC)    Type 2 diabetes mellitus (HCC)    Urgency of urination    dysuria, sui   Wears glasses       Objective:      06/01/2023         ***    05/03/23 189 lb 6 oz (85.9 kg)  04/05/23 184 lb (83.5 kg)  03/16/23 169 lb (76.7 kg)      Vital signs reviewed  06/01/2023  - Note at rest 02 sats  ***% on ***   General appearance:    ***     Min barr***          Assessment

## 2023-06-01 ENCOUNTER — Ambulatory Visit: Payer: MEDICAID | Admitting: Internal Medicine

## 2023-06-01 ENCOUNTER — Encounter: Payer: Self-pay | Admitting: Internal Medicine

## 2023-06-01 NOTE — Patient Instructions (Incomplete)
 Aaron Aas

## 2023-07-12 ENCOUNTER — Telehealth: Payer: Self-pay | Admitting: Nurse Practitioner

## 2023-07-12 NOTE — Telephone Encounter (Signed)
 Pt states Pharmacy sent over PA over a month ago, can you check on this ?

## 2023-07-13 ENCOUNTER — Telehealth: Payer: Self-pay

## 2023-07-13 NOTE — Telephone Encounter (Signed)
 Pharmacy Patient Advocate Encounter   Received notification from Pt Calls Messages that prior authorization for Dexcom G7 sensor is required/requested.   Insurance verification completed.   The patient is insured through [employer] .   Per test claim: PA required; PA submitted to above mentioned insurance via CoverMyMeds Key/confirmation #/EOC [id number] Status is pending

## 2023-07-14 NOTE — Telephone Encounter (Signed)
 Called and let pt know

## 2023-07-14 NOTE — Telephone Encounter (Signed)
 Addressed in new encounter.

## 2023-07-19 ENCOUNTER — Other Ambulatory Visit (HOSPITAL_COMMUNITY): Payer: Self-pay

## 2023-07-22 ENCOUNTER — Other Ambulatory Visit (HOSPITAL_COMMUNITY): Payer: Self-pay

## 2023-07-22 LAB — LAB REPORT - SCANNED
A1c: 7.8
EGFR: 91

## 2023-07-22 NOTE — Telephone Encounter (Signed)
 Pharmacy Patient Advocate Encounter  Received notification from [employer] that Prior Authorization for dexcom g7 sensors has been APPROVED from 07/14/2023 to 01/14/2024   PA #/Case ID/Reference #: [id number]  Patient already filled 90 days on 07/21/23

## 2023-08-08 ENCOUNTER — Other Ambulatory Visit: Payer: Self-pay | Admitting: Nurse Practitioner

## 2023-08-08 ENCOUNTER — Other Ambulatory Visit: Payer: Self-pay | Admitting: Internal Medicine

## 2023-08-09 ENCOUNTER — Other Ambulatory Visit: Payer: Self-pay | Admitting: Internal Medicine

## 2023-08-09 NOTE — Telephone Encounter (Signed)
 Please advise if you would like to refill patient medication

## 2023-08-13 ENCOUNTER — Telehealth: Payer: Self-pay | Admitting: Nurse Practitioner

## 2023-08-13 NOTE — Telephone Encounter (Signed)
Lab results scanned into media tab.

## 2023-08-24 ENCOUNTER — Telehealth: Payer: Self-pay

## 2023-08-24 NOTE — Telephone Encounter (Signed)
 I called patient back regarding her recent after nurse nurse triad  from 08/23/23 at 5:44 PM. I left her a voicemail providing my direct contact number and making er aware that our office required a referral for any new patients along with supporting documentation pertaining to er [email or web address].

## 2023-09-05 ENCOUNTER — Other Ambulatory Visit: Payer: Self-pay | Admitting: Urology

## 2023-09-29 ENCOUNTER — Ambulatory Visit: Payer: MEDICAID | Admitting: Nurse Practitioner

## 2023-09-29 DIAGNOSIS — E782 Mixed hyperlipidemia: Secondary | ICD-10-CM

## 2023-09-29 DIAGNOSIS — I10 Essential (primary) hypertension: Secondary | ICD-10-CM

## 2023-09-29 DIAGNOSIS — E1165 Type 2 diabetes mellitus with hyperglycemia: Secondary | ICD-10-CM

## 2023-09-29 DIAGNOSIS — Z794 Long term (current) use of insulin: Secondary | ICD-10-CM

## 2023-10-31 ENCOUNTER — Other Ambulatory Visit (HOSPITAL_BASED_OUTPATIENT_CLINIC_OR_DEPARTMENT_OTHER): Payer: Self-pay | Admitting: Nurse Practitioner

## 2023-10-31 DIAGNOSIS — I10 Essential (primary) hypertension: Secondary | ICD-10-CM

## 2023-10-31 DIAGNOSIS — I25119 Atherosclerotic heart disease of native coronary artery with unspecified angina pectoris: Secondary | ICD-10-CM

## 2023-10-31 DIAGNOSIS — E782 Mixed hyperlipidemia: Secondary | ICD-10-CM

## 2023-10-31 DIAGNOSIS — E78019 Familial hypercholesterolemia, unspecified: Secondary | ICD-10-CM

## 2023-11-16 ENCOUNTER — Ambulatory Visit: Payer: MEDICAID | Admitting: Nurse Practitioner

## 2023-11-16 ENCOUNTER — Encounter: Payer: Self-pay | Admitting: Nurse Practitioner

## 2023-11-16 VITALS — BP 110/78 | HR 74 | Ht 60.0 in | Wt 198.8 lb

## 2023-11-16 DIAGNOSIS — I10 Essential (primary) hypertension: Secondary | ICD-10-CM

## 2023-11-16 DIAGNOSIS — E1165 Type 2 diabetes mellitus with hyperglycemia: Secondary | ICD-10-CM | POA: Diagnosis not present

## 2023-11-16 DIAGNOSIS — E782 Mixed hyperlipidemia: Secondary | ICD-10-CM

## 2023-11-16 DIAGNOSIS — Z794 Long term (current) use of insulin: Secondary | ICD-10-CM | POA: Diagnosis not present

## 2023-11-16 LAB — POCT GLYCOSYLATED HEMOGLOBIN (HGB A1C): Hemoglobin A1C: 8.3 % — AB (ref 4.0–5.6)

## 2023-11-16 MED ORDER — INSULIN ASPART 100 UNIT/ML IJ SOLN: INTRAMUSCULAR | 6 refills | Status: AC

## 2023-11-16 MED ORDER — OMNIPOD 5 DEXG7G6 INTRO GEN 5 KIT: PACK | 0 refills | Status: AC

## 2023-11-16 MED ORDER — OMNIPOD 5 DEXG7G6 PODS GEN 5 MISC: 6 refills | Status: DC

## 2023-11-16 NOTE — Progress Notes (Signed)
 11/16/2023, 3:45 PM                   Endocrinology follow-up note   Subjective:    Patient ID: Sandra [name], female    DOB: [birth year].  Sandra [name] is being seen in follow-up  for management of currently uncontrolled symptomatic type 2  diabetes requested by  Myra Geni ORN, FNP.   Past Medical History:  Diagnosis Date   ADHD (attention deficit hyperactivity disorder)    Allergic rhinitis    Anemia    hx of   Anxiety    Arthritis    Barrett's esophagus    Bipolar 1 disorder (HCC)    Bladder tumor    CAD (coronary artery disease)    Cancer (HCC)    bladder   Constipation    Depression    Dilated cardiomyopathy (HCC) 12/20/2020   Echo 6/22 ([facility]): EF 43, mild MR, mild TR // Echo 3/22: EF 50-55   Dizziness    Full dentures    GERD (gastroesophageal reflux disease)    Gross hematuria    History of kidney stones    Hyperlipidemia    Hypertension    Stable angina    Type 2 diabetes mellitus (HCC)    Urgency of urination    dysuria, sui   Wears glasses     Past Surgical History:  Procedure Laterality Date   ABDOMINAL HYSTERECTOMY     ANTERIOR CERVICAL DECOMP/DISCECTOMY FUSION N/A 03/05/2022   Procedure: ANTERIOR CERVICAL DECOMPRESSION/DISCECTOMY FUSION 1 LEVEL; C6-C7;  Surgeon: Burnetta Aures, MD;  [facility]: [facility];  Service: Orthopedics;  Laterality: N/A;  3 hrs 3 C-Bed   ANTERIOR INTEROSSEOUS NERVE DECOMPRESSION Right 03/16/2023   Procedure: Right elbow ulnar nerve release with anterior transposition and flexor pronator lengthening as necessary.;  Surgeon: Camella Fallow, MD;  Location: [facility];  Service: Orthopedics;  Laterality: Right;  90 mins   BALLOON DILATION N/A 02/17/2021   Procedure: BALLOON DILATION;  Surgeon: Cindie Carlin POUR, DO;  [facility]: [facility];  Service: Endoscopy;  Laterality: N/A;   BIOPSY  02/17/2021   Procedure: BIOPSY;  Surgeon: Cindie Carlin POUR, DO;   [facility];  Service: Endoscopy;;   COLONOSCOPY WITH PROPOFOL  N/A 02/17/2021   Surgeon: Cindie Carlin POUR, DO; nonbleeding internal hemorrhoids, otherwise normal exam.  Repeat in 10 years.   CYSTOSCOPY N/A 10/03/2015   Procedure: CYSTOSCOPY;  Surgeon: Norleen Seltzer, MD;  [facility];  Service: Urology;  Laterality: N/A;   CYSTOSCOPY W/ URETERAL STENT PLACEMENT Right 07/24/2021   Procedure: CYSTOSCOPY WITH RETROGRADE PYELOGRAM/URETERAL STENT PLACEMENT;  Surgeon: Sherrilee Belvie LITTIE, MD;  Location: [facility];  Service: Urology;  Laterality: Right;   CYSTOSCOPY WITH RETROGRADE PYELOGRAM, URETEROSCOPY AND STENT PLACEMENT Right 08/14/2021   Procedure: CYSTOSCOPY WITH RETROGRADE PYELOGRAM, URETEROSCOPY AND STENT EXCHANGE;  Surgeon: Sherrilee Belvie LITTIE, MD;  Location: [facility];  Service: Urology;  Laterality: Right;   CYSTOSCOPY WITH STENT PLACEMENT Right 10/31/2015   Procedure: CYSTOSCOPY WITH ATTEMPTED RIGHT URETERAL OPENING;  Surgeon: Norleen Seltzer, MD;  Location: [facility];  Service: Urology;  Laterality: Right;   ESOPHAGOGASTRODUODENOSCOPY (EGD) WITH PROPOFOL   N/A 02/17/2021   Surgeon: Cindie Carlin POUR, DO; 2 cm hiatal hernia, Barrett's esophagus, gastritis with biopsies negative for H. pylori.  S/p empiric esophageal dilation.  Repeat in 3 years.   FOOT SURGERY Left    September 2022, broke 7 bones in left foot   INTERSTIM IMPLANT PLACEMENT N/A 03/14/2018   Procedure: RENNA IMPLANT FIRST STAGE;  Surgeon: Sherrilee Belvie CROME, MD;  Location: [facility];  Service: Urology;  Laterality: N/A;   INTERSTIM IMPLANT PLACEMENT N/A 03/30/2018   Procedure: RENNA IMPLANT SECOND STAGE;  Surgeon: Sherrilee Belvie CROME, MD;  Location: [facility];  Service: Urology;  Laterality: N/A;   INTERSTIM IMPLANT REVISION N/A 12/01/2021   Procedure: REVISION OF RENNA- generator anf lead replacement;  Surgeon: Sherrilee Belvie CROME, MD;  Location: [facility];  Service: Urology;   Laterality: N/A;   LEFT HEART CATH AND CORONARY ANGIOGRAPHY N/A 05/31/2020   Procedure: LEFT HEART CATH AND CORONARY ANGIOGRAPHY;  Surgeon: Burnard Debby LABOR, MD;  [facility];  Service: Cardiovascular;  Laterality: N/A;   MULTIPLE TOOTH EXTRACTIONS  2015   OVARIAN CYST REMOVAL Right 2004 approx   SHOULDER OPEN ROTATOR CUFF REPAIR Left 04/20/2019   Procedure: ROTATOR CUFF REPAIR SHOULDER OPEN;  Surgeon: Margrette Taft BRAVO, MD;  Location: [facility];  Service: Orthopedics;  Laterality: Left;   TOE ARTHROPLASTY Left 05/03/2023   Procedure: 2nd metatarsal head resection;  Surgeon: Barton Drape, MD;  Location: [facility];  Service: Orthopedics;  Laterality: Left;  LEFT 2ND HAMMERTOE CORRECTION WITH FUSION PIPP   TONSILLECTOMY  12/30/2004   TOTAL ABDOMINAL HYSTERECTOMY W/ BILATERAL SALPINGOOPHORECTOMY  05/16/2004   TRANSURETHRAL RESECTION OF BLADDER TUMOR N/A 10/03/2015   Procedure: TRANSURETHRAL RESECTION OF BLADDER TUMOR (TURBT);  Surgeon: Norleen Seltzer, MD;  Location: [facility];  Service: Urology;  Laterality: N/A;   TRANSURETHRAL RESECTION OF BLADDER TUMOR N/A 10/31/2015   Procedure: RE-STAGINGG TRANSURETHRAL RESECTION OF BLADDER TUMOR (TURBT);  Surgeon: Norleen Seltzer, MD;  Location: [facility];  Service: Urology;  Laterality: N/A;   TRANSURETHRAL RESECTION OF BLADDER TUMOR N/A 08/17/2016   Procedure: TRANSURETHRAL RESECTION OF BLADDER TUMOR (TURBT);  Surgeon: Sherrilee Belvie CROME, MD;  Location[facility];  Service: Urology;  Laterality: N/A;   URETERAL BIOPSY Right 08/14/2021   Procedure: URETERAL BIOPSY;  Surgeon: Sherrilee Belvie CROME, MD;  Location: [facility];  Service: Urology;  Laterality: Right;   WEIL OSTEOTOMY Left 05/03/2023   Procedure: VERDENE FURLONG;  Surgeon: Barton Drape, MD;  Location: [facility];  Service: Orthopedics;  Laterality: Left;  possible 2nd metatarsal Weil osteotomy versus metatarsal head resection  with foot callus debridement    Social History   Socioeconomic History   Marital status: Married    Spouse name: Not on file   Number of children: Not on file   Years of education: Not on file   Highest education level: Not on file  Occupational History   Not on file  Tobacco Use   Smoking status: Former    Current packs/day: 0.00    Average packs/day: 0.5 packs/day for 10.0 years (5.0 ttl pk-yrs)    Types: Cigarettes    Start date: 02/01/2011    Quit date: 01/31/2021    Years since quitting: 2.7   Smokeless tobacco: Never   Tobacco comments:    02/06/21-no smoking past 5 days  Vaping Use   Vaping status: Never Used  Substance and Sexual Activity   Alcohol use: No   Drug use:  No   Sexual activity: Not Currently    Birth control/protection: Surgical    Comment: hyst  Other Topics Concern   Not on file  Social History Narrative   Are you right handed or left handed? Left Handed    Are you currently employed ? No    What is your current occupation?   Do you live at home alone? No    Who lives with you? Lives with daughter    What type of home do you live in: 1 story or 2 story? Lives in a trailer        Social Drivers of Health   Financial Resource Strain: High Risk (11/19/2022)   Overall Financial Resource Strain (CARDIA)    Difficulty of Paying Living Expenses: Hard  Food Insecurity: Food Insecurity Present (11/19/2022)   Hunger Vital Sign    Worried About Running Out of Food in the Last Year: Sometimes true    Ran Out of Food in the Last Year: Never true  Transportation Needs: No Transportation Needs (11/19/2022)   PRAPARE - Administrator, Civil Service (Medical): No    Lack of Transportation (Non-Medical): No  Physical Activity: Not on file  Stress: Not on file  Social Connections: Unknown (05/27/2021)   [facility]   Social Network    Social Network: Not on file    Family History  Problem Relation Age of Onset   Hypertension  Mother    Cancer Mother    Breast cancer Maternal Aunt    Lung cancer Maternal Uncle    Cancer Other    Diabetes Daughter    Colon cancer Neg Hx     Outpatient Encounter Medications as of 11/16/2023  Medication Sig   Accu-Chek Softclix Lancets lancets Use as instructed to monitor glucose 4 times daily   amLODipine  (NORVASC ) 5 MG tablet TAKE TWO TABLETS BY MOUTH ONCE DAILY   aspirin  EC 81 MG tablet Take 1 tablet (81 mg total) by mouth daily with breakfast. Swallow whole.   Blood Glucose Monitoring Suppl (ACCU-CHEK GUIDE) w/Device KIT 1 Piece by Does not apply route as directed.   Blood Pressure Monitoring (BLOOD PRESSURE CUFF) MISC See admin instructions.   budeson-glycopyrrolate -formoterol  (BREZTRI  AEROSPHERE) 160-9-4.8 MCG/ACT AERO Take 2 puffs first thing in am and then another 2 puffs about 12 hours later.   buPROPion  (WELLBUTRIN  XL) 300 MG 24 hr tablet Take 300 mg by mouth every morning.   cariprazine (VRAYLAR) 3 MG capsule Take 3 mg by mouth at bedtime.   cloNIDine  HCl (KAPVAY ) 0.1 MG TB12 ER tablet Take 0.1 mg by mouth at bedtime.   Continuous Glucose Sensor (DEXCOM G7 SENSOR) MISC Inject 1 Application into the skin as directed. Change sensor every 10 days as directed.   cyclobenzaprine  (FLEXERIL ) 10 MG tablet Take 1 tablet (10 mg total) by mouth 3 (three) times daily as needed for muscle spasms.   Docusate Sodium  (DSS) 100 MG CAPS TAKE ONE CAPSULE TWICE DAILY BY MOUTH   Evolocumab  (REPATHA  SURECLICK) 140 MG/ML SOAJ Inject 140 mg into the skin every 14 (fourteen) days.   fenofibrate  (TRICOR ) 145 MG tablet take 1 tablet once daily.   FLUoxetine  (PROZAC ) 40 MG capsule Take 40 mg by mouth in the morning.   fluticasone  (FLONASE ) 50 MCG/ACT nasal spray Place 2 sprays into both nostrils daily.   gabapentin  (NEURONTIN ) 800 MG tablet Take 800 mg by mouth 3 (three) times daily.   GLOBAL EASE INJECT PEN NEEDLES 31G X  5 MM MISC Inject into the skin.   glucose blood (ACCU-CHEK GUIDE TEST) test  strip Use as instructed to monitor glucose 4 times daily   HYDROcodone -acetaminophen  (NORCO/VICODIN) 5-325 MG tablet Take 1 tablet by mouth every 6 (six) hours as needed.   Insulin  Disposable Pump (OMNIPOD 5 [id number] PODS GEN 5) MISC Change pod every 48-72 hours   Insulin  Pen Needle (PEN NEEDLES) 31G X 6 MM MISC Use to inject insulin  4 times daily   INSULIN  SYRINGE 1CC/29G 29G X 1/2 1 ML MISC Use to inject insulin  into Omnipod   isosorbide  mononitrate (IMDUR ) 30 MG 24 hr tablet Take 1 tablet (30 mg total) by mouth daily.   lisdexamfetamine (VYVANSE ) 60 MG capsule Take 60 mg by mouth every morning.   meclizine (ANTIVERT) 25 MG tablet Take 25 mg by mouth 4 (four) times a week.   methylPREDNISolone  (MEDROL  DOSEPAK) 4 MG TBPK tablet Take by mouth.   metoprolol  succinate (TOPROL -XL) 50 MG 24 hr tablet Take 1 tablet (50 mg total) by mouth daily. Take with or immediately following a meal.   montelukast  (SINGULAIR ) 10 MG tablet Take 1 tablet by mouth daily.   nitroGLYCERIN  (NITROSTAT ) 0.4 MG SL tablet PLACE 1 TAB UNDER TONGUE EVERY 3 MINUTES AS DIRECTED UP TO 3 TIMES AS NEEDED FOR CHEST PAIN. (Patient taking differently: Place 0.4 mg under the tongue every 5 (five) minutes as needed for chest pain.)   pantoprazole  (PROTONIX ) 40 MG tablet take 1 tablet twice daily before a meal   rosuvastatin  (CRESTOR ) 20 MG tablet Take 1 tablet (20 mg total) by mouth daily.   scopolamine  (TRANSDERM-SCOP) 1 MG/3DAYS Place 1 patch onto the skin every 3 (three) days.   SYMBICORT  80-4.5 MCG/ACT inhaler INHALE TWO PUFFS BY MOUTH TWICE DAILY   tamsulosin  (FLOMAX ) 0.4 MG CAPS capsule TAKE ONE CAPSULE BY MOUTH AT BEDTIME   Vitamin D , Ergocalciferol , (DRISDOL) 1.25 MG (50000 UNIT) CAPS capsule Take 50,000 Units by mouth once a week.   [DISCONTINUED] insulin  aspart (NOVOLOG ) 100 UNIT/ML injection Use with Omnipod for TDD around 150 units daily   [DISCONTINUED] Insulin  Disposable Pump (OMNIPOD DASH PDM, GEN 4,) KIT Change pod every  48-72 hours   [DISCONTINUED] Insulin  Disposable Pump (OMNIPOD DASH PODS, GEN 4,) MISC CHANGE POD EVERY 48 HOURS AS DIRECTED   insulin  aspart (NOVOLOG ) 100 UNIT/ML injection Use with Omnipod for TDD around 150 units daily   No facility-administered encounter medications on file as of 11/16/2023.    ALLERGIES: Allergies  Allergen Reactions   Ibuprofen Hives and Itching   Benazepril Hives   Diphenhydramine  Hives   Latex Itching and Dermatitis    States she had redness to skin and itching    Metronidazole  Nausea And Vomiting   Oxycodone      Pt has tolerated hydromorphone  in the past and takes Norco at home.   Toradol  [Ketorolac  Tromethamine ] Itching   Zofran  [Ondansetron  Hcl] Hives   Adhesive [Tape] Rash   Codeine Hives, Nausea And Vomiting and Rash   Loratadine  Rash   Nystatin -Triamcinolone  Rash   Penicillins Nausea Only and Rash    Received ancef  w/o issue (7/23)    VACCINATION STATUS: Immunization History  Administered Date(s) Administered   Influenza,inj,Quad PF,6+ Mos 11/01/2015   PFIZER(Purple Top)SARS-COV-2 Vaccination 06/24/2019, 10/03/2019    Diabetes She presents for her follow-up diabetic visit. She has type 2 diabetes mellitus. Onset time: She was diagnosed at approximate age of 69 years with A1c of greater than 15% Her disease course has been stable. There  are no hypoglycemic associated symptoms. Pertinent negatives for hypoglycemia include no confusion, pallor or seizures. Associated symptoms include foot paresthesias. Pertinent negatives for diabetes include no fatigue, no polydipsia, no polyphagia, no polyuria and no weight loss. There are no hypoglycemic complications. Symptoms are stable. Diabetic complications include peripheral neuropathy. Risk factors for coronary artery disease include dyslipidemia, diabetes mellitus, hypertension, sedentary lifestyle and tobacco exposure. Current diabetic treatment includes insulin  pump. She is compliant with treatment most of  the time. Her weight is fluctuating minimally. She is following a generally unhealthy diet. When asked about meal planning, she reported none. She has not had a previous visit with a dietitian. She rarely participates in exercise. Her home blood glucose trend is fluctuating minimally. Her overall blood glucose range is 180-200 mg/dl. (She presents today with her CGM and Omnipod DASH showing slightly above target glycemic profile.  Her POCT A1c today is 8.3% increasing from last visit of 7.4%.  She notes she lost her mother recently and has been having a hard time processing her grief.  Analysis of her CGM shows TIR 49%, TAR 51%, TBR <1% with a GMI of 7.8%.) An ACE inhibitor/angiotensin II receptor blocker is not being taken. She does not see a podiatrist.Eye exam is current.    Review of systems  Constitutional: + fluctuating body weight,  current Body mass index is 38.83 kg/m. , no fatigue, no subjective hyperthermia, no subjective hypothermia Eyes: no blurry vision, no xerophthalmia ENT: no sore throat, no nodules palpated in throat, no dysphagia/odynophagia, no hoarseness Cardiovascular: no chest pain, no shortness of breath, no palpitations, no leg swelling Respiratory: no cough, no shortness of breath Gastrointestinal: no nausea/vomiting/diarrhea Musculoskeletal: no muscle/joint aches Skin: no rashes, no hyperemia Neurological: no tremors, no numbness, no tingling, no dizziness Psychiatric: no depression, no anxiety, + increased stress- recently lost her mother   Objective:    BP 110/78 (BP Location: Left Arm, Patient Position: Sitting, Cuff Size: Large)   Pulse 74   Ht 5' (1.524 m)   Wt 198 lb 12.8 oz (90.2 kg)   BMI 38.83 kg/m   Wt Readings from Last 3 Encounters:  11/16/23 198 lb 12.8 oz (90.2 kg)  05/28/23 186 lb 3.2 oz (84.5 kg)  05/26/23 190 lb 6.4 oz (86.4 kg)    BP Readings from Last 3 Encounters:  11/16/23 110/78  05/28/23 112/80  05/26/23 128/84     Physical Exam-  Limited  Constitutional:  Body mass index is 38.83 kg/m. , not in acute distress, normal state of mind Eyes:  EOMI, no exophthalmos Musculoskeletal: no gross deformities, strength intact in all four extremities, no gross restriction of joint movements Skin:  no rashes, no hyperemia Neurological: no tremor with outstretched hands   Diabetic Foot Exam - Simple   No data filed    CMP ( most recent) CMP     Component Value Date/Time   NA 137 04/29/2023 1218   NA 136 11/10/2022 1135   K 4.4 04/29/2023 1218   CL 102 04/29/2023 1218   CO2 23 04/29/2023 1218   GLUCOSE 206 (H) 04/29/2023 1218   BUN 10 04/29/2023 1218   BUN 11 11/10/2022 1135   CREATININE 0.95 04/29/2023 1218   CALCIUM  9.1 04/29/2023 1218   PROT 6.8 11/10/2022 1135   ALBUMIN 4.1 11/10/2022 1135   AST 19 11/10/2022 1135   ALT 19 11/10/2022 1135   ALKPHOS 122 (H) 11/10/2022 1135   BILITOT <0.2 11/10/2022 1135   GFRNONAA >60 04/29/2023 1218  GFRAA >60 04/17/2019 1427     Diabetic Labs (most recent): Lab Results  Component Value Date   HGBA1C 8.3 (A) 11/16/2023   HGBA1C 7.4 (A) 05/28/2023   HGBA1C 7.4 (A) 12/18/2022   MICROALBUR 80 06/24/2021   MICROALBUR 80 07/10/2020      Assessment & Plan:   1) Uncontrolled type 2 diabetes mellitus with hyperglycemia (HCC)  - Sandra [name] has currently uncontrolled symptomatic type 2 DM since  48 years of age.  She presents today with her CGM and Omnipod DASH showing slightly above target glycemic profile.  Her POCT A1c today is 8.3% increasing from last visit of 7.4%.  She notes she lost her mother recently and has been having a hard time processing her grief.  Analysis of her CGM shows TIR 49%, TAR 51%, TBR <1% with a GMI of 7.8%.  - I had a long discussion with her about the progressive nature of diabetes and the pathology behind its complications. -her diabetes is complicated by chronic smoking and she remains at a high risk for more acute and chronic  complications which include CAD, CVA, CKD, retinopathy, and neuropathy. These are all discussed in detail with her.  - Nutritional counseling repeated/built upon at each appointment.  - The patient admits there is a room for improvement in their diet and drink choices. -  Suggestion is made for the patient to avoid simple carbohydrates from their diet including Cakes, Sweet Desserts / Pastries, Ice Cream, Soda (diet and regular), Sweet Tea, Candies, Chips, Cookies, Sweet Pastries, Store Bought Juices, Alcohol in Excess of 1-2 drinks a day, Artificial Sweeteners, Coffee Creamer, and Sugar-free Products. This will help patient to have stable blood glucose profile and potentially avoid unintended weight gain.   - I encouraged the patient to switch to unprocessed or minimally processed complex starch and increased protein intake (animal or plant source), fruits, and vegetables.   - Patient is advised to stick to a routine mealtimes to eat 3 meals a day and avoid unnecessary snacks (to snack only to correct hypoglycemia).  - I have approached her with the following individualized plan to manage  her diabetes and patient agrees:   -No changes will be made to her insulin  pump settings today.  I did send in for upgrade to Omnipod 5 pods to her pharmacy to see if her insurance will cover it.  It has better connection with CGM and better glucose lowering properties.  Will also set aside a starter kit here and send pump trainer a message regarding the proposed switch.  -She is encouraged to continue monitoring blood glucose 4 times daily (using her CGM), before meals and at bedtime, and to call the clinic if she has readings less than 70 or greater than 300 for 3 readings in a row.    - Specific targets for  A1c;  LDL, HDL, Triglycerides, were discussed with the patient.  2) Blood Pressure /Hypertension: Her blood pressure is not controlled to target. It does not appear she is on any antihypertensive  medications.  She is allergic to ACE inhibitors. She has a history of being a chronic heavy smoker, quit in 2021.  Sandra [name]   3) Lipids/Hyperlipidemia:  Her most recent lipid panel from 11/10/22 shows uncontrolled LDL of 191 (worsening) and elevated triglycerides of 180 (improved).  She is advised to continue her Crestor  40 mg po daily at bedtime, Repatha  140 mg po every 14 days, and Tricor  145 mg po daily.  Side effects and  precautions discussed with her.  She has fasting labs with her PCP coming up, I asked that copy be brought to use next visit for our records.  4)  Weight/Diet:  Her Body mass index is 38.83 kg/m.-clearly complicating her diabetes care.  She is a candidate for modest weight loss.  I discussed with her the fact that loss of 5 - 10% of her  current body weight will have the most impact on her diabetes management.  Exercise, and detailed carbohydrates information provided  -  detailed on discharge instructions.  5) Chronic Care/Health Maintenance: -she is not on ACEI/ARB and is on Statin medications and is encouraged to initiate and continue to follow up with Ophthalmology, Dentist,  Podiatrist at least yearly or according to recommendations, and advised to stay away from smoking. I have recommended yearly flu vaccine and pneumonia vaccine at least every 5 years; moderate intensity exercise for up to 150 minutes weekly; and  sleep for at least 7 hours a day.  - she is advised to maintain close follow up with Myra Geni ORN, FNP for primary care needs, as well as her other providers for optimal and coordinated care.      I spent  46  minutes in the care of the patient today including review of labs from CMP, Lipids, Thyroid Function, Hematology (current and previous including abstractions from other facilities); face-to-face time discussing  her blood glucose readings/logs, discussing hypoglycemia and hyperglycemia episodes and symptoms, medications doses, her options of short and long  term treatment based on the latest standards of care / guidelines;  discussion about incorporating lifestyle medicine;  and documenting the encounter. Risk reduction counseling performed per USPSTF guidelines to reduce obesity and cardiovascular risk factors.     Please refer to Patient Instructions for Blood Glucose Monitoring and Insulin /Medications Dosing Guide  in media tab for additional information. Please  also refer to  Patient Self Inventory in the Media  tab for reviewed elements of pertinent patient history.  Sandra [name] participated in the discussions, expressed understanding, and voiced agreement with the above plans.  All questions were answered to her satisfaction. she is encouraged to contact clinic should she have any questions or concerns prior to her return visit.   Follow up plan: - Return in about 4 months (around 03/15/2024) for Diabetes F/U with A1c in office, No previsit labs, Bring meter and logs.  Benton Rio, [facility] [address] [place], [place] [address] Phone: [phone] Fax: [phone]  11/16/2023, 3:45 PM

## 2023-11-16 NOTE — Addendum Note (Signed)
 Addended by: Zorina Mallin J on: 11/16/2023 04:04 PM   Modules accepted: Orders

## 2023-11-29 ENCOUNTER — Other Ambulatory Visit: Payer: Self-pay | Admitting: Nurse Practitioner

## 2023-11-29 ENCOUNTER — Other Ambulatory Visit: Payer: Self-pay | Admitting: Internal Medicine

## 2023-12-10 ENCOUNTER — Other Ambulatory Visit: Payer: Self-pay | Admitting: Nurse Practitioner

## 2024-01-14 ENCOUNTER — Telehealth: Payer: Self-pay | Admitting: Pharmacy Technician

## 2024-01-14 ENCOUNTER — Other Ambulatory Visit (HOSPITAL_COMMUNITY): Payer: Self-pay

## 2024-01-14 NOTE — Telephone Encounter (Signed)
 Pharmacy Patient Advocate Encounter  Received notification from [facility] that Prior Authorization for repatha  has been APPROVED from 01/13/24 to 01/12/25   PA #/Case ID/Reference #: [id number]

## 2024-01-14 NOTE — Telephone Encounter (Signed)
" ° ° °  Pharmacy Patient Advocate Encounter   Received notification from CoverMyMeds that prior authorization for REPATHA  is required/requested.   Insurance verification completed.   The patient is insured through [employer].   Per test claim: PA required; PA submitted to above mentioned insurance via Latent Key/confirmation #/EOC [facility] Status is pending  "

## 2024-01-23 NOTE — Progress Notes (Unsigned)
 " Cardiology Office Note:  .   Date:  01/23/2024  ID:  Sandra [name], DOB 1975/11/19, MRN [id number] PCP: Myra Geni ORN, FNP  [facility] Providers Cardiologist:  Maude Emmer, MD { Click to update primary MD,subspecialty MD or APP then REFRESH:1}   History of Present Illness: .   Sandra [name] is a 49 y.o. 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   Pertinent cardiac medical history:  CAD  Myoview  2/22: no ischemia, EF 45, ?inf infarct  Echocardiogram 3/22: inf HK-AK, EF 50-55 LHC 05/2020: diffuse small vessel disease (diabetic); p-mRCA 70; occluded PDA w/ L-R collats; OM2 60; med mgmt  Myoview  6/22 ([facility]): no ischemia, EF 34 Echo 06/2021: EF 50-55%, septal/inf HK, basal AK Lexiscan  04/2022: intermediate risk based on LVEF 54% otherwise low risk study with no ischemia  Unable to get cMRI due to interstim implant  ??  Last seen in heartcare 05/2023 by Rosaline Bane, NP for follow up.  Reported occasional chest pain and DOE not requiring NTG use.  Patient admitted to noncompliance with metoprolol  or amlodipine  for several months.  Encouraged to resume antianginal therapy.  Also noted previously that patient was off cholesterol medications for several months but has now completed 10 injections of Repatha  since last visit.  Continued on ASA 81 mg daily, Repatha , fenofibrate  145 mg daily, Crestor  20 mg daily, amlodipine  10 mg daily, Imdur  30 mg daily, Toprol -XL 50 mg daily, NTG as needed.   Recent hospitalization   Today, reports ### and denies ###.  Denies chest pain, shortness of breath, palpitations, syncope, presyncope, dizziness, orthopnea, PND, swelling or significant weight changes, acute bleeding, or  claudication.   Reports compliance with medications.  Dietary habitats:  Activity level:  Social: Denies tobacco use/alcohol/drug use  Denies any recent hospitalizations or visits to the emergency department.   Coronary artery disease involving native coronary artery of native heart without angina pectoris Hyperlipidemia LDL goal <55 Familial hypercholesterolemia, unspecified type Denies angina symptoms. No need for further ischemic evaluation at this time.  Last LDL of 191 in 10/2022 is not at goal as below. NMR lipoprofile already pending. Encouraged to complete lab.  Continue ASA 81 mg daily, Repatha , fenofibrate  145 mg daily, Crestor  20 mg daily, amlodipine  10 mg daily, Imdur  30 mg daily, Toprol -XL 50 mg daily, NTG as needed.   Lipid Panel     Component Value Date/Time   CHOL 263 (H) 11/10/2022 1135   TRIG 190 (H) 11/10/2022 1135   HDL 36 (L) 11/10/2022 1135   CHOLHDL 7.3 (H) 11/10/2022 1135   CHOLHDL 6.5 07/20/2021 0759   VLDL 49 (H) 07/20/2021 0759   LDLCALC 191 (H) 11/10/2022 1135   LABVLDL 36 11/10/2022 1135     Primary hypertension Allergy to angiotensin-converting enzyme (ACE) inhibitors Reports well controlled Home BP:  BP this OV well controlled today:  Continue on / Managed by GDMT as above.  Avoid ACEi due to allergy. Previously noted, Urticaria on benazepril.  Encourage physical activity for 150 minutes per week and heart healthy low sodium diet. Discussed limiting sodium intake to < 2 grams daily.     Personal history of noncompliance with medical treatment Discussed the importance of medication compliance.   Type 2 diabetes mellitus with hyperglycemia, unspecified  whether long term insulin  use (HCC) A1C 8.3 in 11/2023.  Managed by PCP.   ROS: 10 point review of system has been reviewed and considered negative except ones been listed in the HPI.   Studies Reviewed: SABRA    Cath 05/2020 Prox RCA lesion is 30% stenosed. RPDA lesion is 100% stenosed. Prox Cx  lesion is 40% stenosed. 1st Mrg lesion is 30% stenosed. Mid Cx lesion is 20% stenosed. 2nd Mrg lesion is 60% stenosed. Mid RCA to Dist RCA lesion is 50% stenosed. Prox RCA to Mid RCA lesion is 70% stenosed. Prox LAD lesion is 20% stenosed. Ramus lesion is 30% stenosed. Mid LAD lesion is 20% stenosed.   Multivessel CAD with diffuse small vessels in this diabetic female.  The LAD has mild 20% areas of narrowing.  The ramus intermediate vessel has 30% narrowing.  The circumflex vessel has a percent proximal narrowing on a bend in the vessel and then has 20 to 30% stenoses.  There is focal 60% stenosis in the second marginal vessel.  From the left system there is collateralization to the very distal PDA.  The right coronary artery is diffusely small vessel in its mid segment with focal narrowing of 70% after the anterior RV marginal branch.  The mid -distal PDA is occluded and collateralized from the left circulation.   LVEDP 10 mmHg   RECOMMENDATION: Medical therapy for CAD with optimal blood pressure control, aggressive lipid management particularly with this patient with significant xanthelasmas on exam diabetic control.   Diagnostic Dominance: Right   ECHO 06/2021 IMPRESSIONS   1. Septal hypokinesis inferior basal akinesis . Left ventricular ejection  fraction, by estimation, is 50 to 55%. The left ventricle has low normal  function. The left ventricle demonstrates regional wall motion  abnormalities (see scoring diagram/findings   for description). Left ventricular diastolic parameters were normal.   2. Right ventricular systolic function is normal. The right ventricular  size is normal.   3. The mitral valve is abnormal. Trivial mitral valve regurgitation. No  evidence of mitral stenosis.   4. The aortic valve is tricuspid. Aortic valve regurgitation is not  visualized. No aortic stenosis is present.   5. The inferior vena cava is normal in size with greater than 50%  respiratory  variability, suggesting right atrial pressure of 3 mmHg.   Lexiscan  04/2022 Narrative & Impression      Stress ECG is negative for ischemia and arrythmias   LV perfusion is abnormal. There is a small reversible perfusion defect with mild reduction in uptake present in the apical anterior location with normal wall motion in the defect area consistent with ischemia. Breast attenuation artifact cannot be completely ruled out.   Left ventricular function is abnormal. Global function is mildly reduced. Nuclear stress EF: 54 %. Consider 2D Echo for accurate estimation of LVEF.   The study is normal. The study is intermediate risk based on LVEF 54% otherwise low risk study.    CV Studies: Cardiac studies reviewed are outlined and summarized above. Otherwise please see EMR for full report.   Risk Assessment/Calculations:   {Does this patient have ATRIAL FIBRILLATION?:[phone]} No BP recorded.  {Refresh Note OR Click here to enter BP  :1}***       Physical Exam:   VS:  There were no vitals taken for this visit.   Wt Readings from Last 3 Encounters:  11/16/23 198 lb 12.8 oz (90.2 kg)  05/28/23 186 lb 3.2 oz (84.5 kg)  05/26/23 190 lb  6.4 oz (86.4 kg)    GEN: Well nourished, well developed in no acute distress while sitting in chair.  NECK: No JVD; No carotid bruits CARDIAC: ***RRR, no murmurs, rubs, gallops RESPIRATORY:  Clear to auscultation without rales, wheezing or rhonchi  ABDOMEN: Soft, non-tender, non-distended EXTREMITIES:  No edema; No deformity   ASSESSMENT AND PLAN: .   ***    {Are you ordering a CV Procedure (e.g. stress test, cath, DCCV, TEE, etc)?   Press F2        :[id number]}  Dispo: ***  Signed, Lorette CINDERELLA Kapur, PA-C  "

## 2024-01-24 ENCOUNTER — Ambulatory Visit: Payer: MEDICAID | Attending: Physician Assistant | Admitting: Physician Assistant

## 2024-01-24 DIAGNOSIS — I2510 Atherosclerotic heart disease of native coronary artery without angina pectoris: Secondary | ICD-10-CM

## 2024-01-24 DIAGNOSIS — Z888 Allergy status to other drugs, medicaments and biological substances status: Secondary | ICD-10-CM

## 2024-01-24 DIAGNOSIS — E1165 Type 2 diabetes mellitus with hyperglycemia: Secondary | ICD-10-CM

## 2024-01-24 DIAGNOSIS — Z91199 Patient's noncompliance with other medical treatment and regimen due to unspecified reason: Secondary | ICD-10-CM

## 2024-01-24 DIAGNOSIS — E785 Hyperlipidemia, unspecified: Secondary | ICD-10-CM

## 2024-01-24 DIAGNOSIS — E78019 Familial hypercholesterolemia, unspecified: Secondary | ICD-10-CM

## 2024-01-24 DIAGNOSIS — I10 Essential (primary) hypertension: Secondary | ICD-10-CM

## 2024-01-27 ENCOUNTER — Encounter (INDEPENDENT_AMBULATORY_CARE_PROVIDER_SITE_OTHER): Payer: Self-pay | Admitting: *Deleted

## 2024-03-06 ENCOUNTER — Ambulatory Visit: Payer: MEDICAID | Admitting: Physician Assistant

## 2024-03-20 ENCOUNTER — Ambulatory Visit: Payer: MEDICAID | Admitting: Nurse Practitioner
